# Patient Record
Sex: Male | Born: 1940 | Race: White | Hispanic: No | Marital: Married | Smoking: Current every day smoker
Health system: Southern US, Community
[De-identification: ages and names within clinical notes are randomized; demographics above are authoritative.]

## PROBLEM LIST (undated history)

## (undated) DIAGNOSIS — I10 Essential (primary) hypertension: Secondary | ICD-10-CM

## (undated) DIAGNOSIS — I2510 Atherosclerotic heart disease of native coronary artery without angina pectoris: Secondary | ICD-10-CM

## (undated) DIAGNOSIS — E872 Acidosis, unspecified: Secondary | ICD-10-CM

## (undated) DIAGNOSIS — I714 Abdominal aortic aneurysm, without rupture, unspecified: Secondary | ICD-10-CM

## (undated) DIAGNOSIS — D638 Anemia in other chronic diseases classified elsewhere: Secondary | ICD-10-CM

## (undated) DIAGNOSIS — R197 Diarrhea, unspecified: Secondary | ICD-10-CM

## (undated) DIAGNOSIS — M549 Dorsalgia, unspecified: Secondary | ICD-10-CM

## (undated) DIAGNOSIS — R531 Weakness: Secondary | ICD-10-CM

## (undated) DIAGNOSIS — D890 Polyclonal hypergammaglobulinemia: Secondary | ICD-10-CM

## (undated) DIAGNOSIS — R748 Abnormal levels of other serum enzymes: Secondary | ICD-10-CM

## (undated) DIAGNOSIS — D649 Anemia, unspecified: Secondary | ICD-10-CM

## (undated) DIAGNOSIS — D696 Thrombocytopenia, unspecified: Secondary | ICD-10-CM

## (undated) DIAGNOSIS — R945 Abnormal results of liver function studies: Secondary | ICD-10-CM

## (undated) DIAGNOSIS — K449 Diaphragmatic hernia without obstruction or gangrene: Secondary | ICD-10-CM

## (undated) DIAGNOSIS — E860 Dehydration: Secondary | ICD-10-CM

## (undated) DIAGNOSIS — G8929 Other chronic pain: Secondary | ICD-10-CM

## (undated) DIAGNOSIS — Z8719 Personal history of other diseases of the digestive system: Secondary | ICD-10-CM

## (undated) DIAGNOSIS — Z8711 Personal history of peptic ulcer disease: Secondary | ICD-10-CM

## (undated) DIAGNOSIS — E538 Deficiency of other specified B group vitamins: Secondary | ICD-10-CM

## (undated) DIAGNOSIS — R7989 Other specified abnormal findings of blood chemistry: Secondary | ICD-10-CM

## (undated) DIAGNOSIS — E785 Hyperlipidemia, unspecified: Secondary | ICD-10-CM

## (undated) DIAGNOSIS — K5900 Constipation, unspecified: Secondary | ICD-10-CM

## (undated) DIAGNOSIS — M109 Gout, unspecified: Secondary | ICD-10-CM

## (undated) DIAGNOSIS — E559 Vitamin D deficiency, unspecified: Secondary | ICD-10-CM

## (undated) DIAGNOSIS — N179 Acute kidney failure, unspecified: Secondary | ICD-10-CM

## (undated) DIAGNOSIS — M1990 Unspecified osteoarthritis, unspecified site: Secondary | ICD-10-CM

## (undated) DIAGNOSIS — R634 Abnormal weight loss: Secondary | ICD-10-CM

## (undated) HISTORY — DX: Constipation, unspecified: K59.00

## (undated) HISTORY — DX: Acidosis, unspecified: E87.20

## (undated) HISTORY — DX: Atherosclerotic heart disease of native coronary artery without angina pectoris: I25.10

## (undated) HISTORY — DX: Acidosis: E87.2

## (undated) HISTORY — DX: Abnormal results of liver function studies: R94.5

## (undated) HISTORY — DX: Weakness: R53.1

## (undated) HISTORY — DX: Other chronic pain: G89.29

## (undated) HISTORY — DX: Hyperlipidemia, unspecified: E78.5

## (undated) HISTORY — PX: ANKLE FRACTURE SURGERY: SHX122

## (undated) HISTORY — DX: Abnormal levels of other serum enzymes: R74.8

## (undated) HISTORY — PX: LUMBAR DISC SURGERY: SHX700

## (undated) HISTORY — DX: Abnormal weight loss: R63.4

## (undated) HISTORY — DX: Dorsalgia, unspecified: M54.9

## (undated) HISTORY — PX: FRACTURE SURGERY: SHX138

## (undated) HISTORY — DX: Diarrhea, unspecified: R19.7

## (undated) HISTORY — PX: CARPAL TUNNEL RELEASE: SHX101

## (undated) HISTORY — PX: BACK SURGERY: SHX140

## (undated) HISTORY — DX: Other specified abnormal findings of blood chemistry: R79.89

## (undated) HISTORY — DX: Gout, unspecified: M10.9

## (undated) HISTORY — DX: Abdominal aortic aneurysm, without rupture, unspecified: I71.40

## (undated) HISTORY — DX: Acute kidney failure, unspecified: N17.9

## (undated) HISTORY — PX: POSTERIOR LUMBAR FUSION: SHX6036

## (undated) HISTORY — DX: Thrombocytopenia, unspecified: D69.6

## (undated) HISTORY — DX: Polyclonal hypergammaglobulinemia: D89.0

## (undated) HISTORY — DX: Anemia in other chronic diseases classified elsewhere: D63.8

## (undated) HISTORY — DX: Dehydration: E86.0

## (undated) HISTORY — PX: TOE AMPUTATION: SHX809

## (undated) HISTORY — DX: Abdominal aortic aneurysm, without rupture: I71.4

## (undated) HISTORY — DX: Deficiency of other specified B group vitamins: E53.8

## (undated) HISTORY — DX: Anemia, unspecified: D64.9

## (undated) HISTORY — DX: Vitamin D deficiency, unspecified: E55.9

## (undated) HISTORY — DX: Essential (primary) hypertension: I10

## (undated) HISTORY — DX: Diaphragmatic hernia without obstruction or gangrene: K44.9

## (undated) NOTE — *Deleted (*Deleted)
NAME:  Paul [name], MRN:  [id number], DOB:  19-Dec-1940, LOS: 2 ADMISSION DATE:  12/01/2019, CONSULTATION DATE:  11/1 REFERRING MD:  Dr. Deretha Emory, CHIEF COMPLAINT:   hypoxia  Brief History   47 year old male with RA admitted with hypoxia and hemoptysis.   History of present illness   ***  Past Medical History  ***  Significant Hospital Events   ***  Consults:  ***  Procedures:  ***  Significant Diagnostic Tests:  ***  Micro Data:  ***  Antimicrobials:  ***   Interim history/subjective:  ***  Objective   Blood pressure (!) 149/61, pulse 67, temperature 97.9 F (36.6 C), temperature source Oral, resp. rate 18, height 5\' 10"  (1.778 m), weight 86.7 kg, SpO2 97 %.        Intake/Output Summary (Last 24 hours) at 12/03/2019 1438 Last data filed at 12/03/2019 1230 Gross per 24 hour  Intake 1491.93 ml  Output 1950 ml  Net -458.07 ml   Filed Weights   12/01/19 1926 12/02/19 1931  Weight: 84.4 kg 86.7 kg    Examination: General: *** HENT: *** Lungs: *** Cardiovascular: *** Abdomen: *** Extremities: *** Neuro: *** GU: ***  Resolved Hospital Problem list   ***  Assessment & Plan:  ***  Best practice:  Diet: *** Pain/Anxiety/Delirium protocol (if indicated): *** VAP protocol (if indicated): *** DVT prophylaxis: *** GI prophylaxis: *** Glucose control: *** Mobility: *** Code Status: *** Family Communication: *** Disposition:   Labs   CBC: Recent Labs  Lab 11/29/19 1319 12/01/19 1915 12/02/19 1037 12/03/19 0323  WBC 16.2* 31.9*  32.0* 24.4* 18.9*  NEUTROABS 10.3* 21.0*  --  14.6*  HGB 10.0* 11.2*  11.0* 9.4* 8.7*  HCT 30.3* 34.9*  34.7* 29.2* 26.9*  MCV 81.2 82.9  82.4 83.0 82.0  PLT 214 258  275 222 [id number]    Basic Metabolic Panel: Recent Labs  Lab 12/01/19 1915 12/02/19 1032 12/03/19 0323  NA 131* 133* 135  K 4.7 4.6 4.6  CL 105 107 107  CO2 18* 21* 22  GLUCOSE 130* 138* 96  BUN 26* 25* 28*  CREATININE 1.36* 1.26*  1.41*  CALCIUM 8.5* 8.5* 8.4*  MG  --   --  1.6*   GFR: Estimated Creatinine Clearance: 45.3 mL/min (A) (by C-G formula based on SCr of 1.41 mg/dL (H)). Recent Labs  Lab 11/29/19 1319 12/01/19 1915 12/01/19 2019 12/02/19 1032 12/02/19 1037 12/03/19 0323  PROCALCITON  --   --   --  0.18  --   --   WBC 16.2* 31.9*  32.0*  --   --  24.4* 18.9*  LATICACIDVEN  --   --  1.1 1.2  --   --     Liver Function Tests: Recent Labs  Lab 12/01/19 2019 12/02/19 1032 12/03/19 0323  AST 25 19 22   ALT 17 17 18   ALKPHOS 83 74 80  BILITOT 1.3* 0.9 0.8  PROT 7.5 7.5 7.4  ALBUMIN 2.5* 2.6* 2.4*   No results for input(s): LIPASE, AMYLASE in the last 168 hours. No results for input(s): AMMONIA in the last 168 hours.  ABG No results found for: PHART, PCO2ART, PO2ART, HCO3, TCO2, ACIDBASEDEF, O2SAT   Coagulation Profile: Recent Labs  Lab 12/01/19 2019  INR 1.2    Cardiac Enzymes: No results for input(s): CKTOTAL, CKMB, CKMBINDEX, TROPONINI in the last 168 hours.  HbA1C: Hemoglobin A1C  Date/Time Value Ref Range Status  06/12/2019 03:19 PM 5.2 4.0 - 5.6 % Final  01/17/2019 02:49 PM 5.7 (A) 4.0 - 5.6 % Final  04/25/2015 12:00 AM 7.7  Final   Hgb A1c MFr Bld  Date/Time Value Ref Range Status  12/02/2019 10:34 AM 4.7 (L) 4.8 - 5.6 % Final    Comment:    (NOTE) Pre diabetes:          5.7%-6.4%  Diabetes:              >6.4%  Glycemic control for   <7.0% adults with diabetes   09/02/2018 05:06 AM 5.2 4.8 - 5.6 % Final    Comment:    (NOTE) Pre diabetes:          5.7%-6.4% Diabetes:              >6.4% Glycemic control for   <7.0% adults with diabetes     CBG: No results for input(s): GLUCAP in the last 168 hours.  Review of Systems:   ***  Past Medical History  He,  has a past medical history of AAA (abdominal aortic aneurysm) ([place]), Abnormal LFTs, AKI (acute kidney injury) ([place]), Anemia of chronic disease, Arthritis, Atherosclerosis of native coronary artery of  native heart without angina pectoris, CAD (coronary artery disease), Chronic back pain, Constipation, Coronary atherosclerosis of native coronary artery, Dehydration, Diabetes mellitus, Diarrhea, Elevated liver enzymes, Generalized weakness, Gout of right hand, Hiatal hernia, History of gastroesophageal reflux (GERD), History of stomach ulcers, Hyperlipidemia, Hypertension, Metabolic acidosis, Normochromic anemia (02/23/2016), 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   Surgical History    Past Surgical History:  Procedure Laterality Date  . ANKLE FRACTURE SURGERY Left    "crushed"  . BACK SURGERY  X 3   "fell 4 stories in 1977"  . CARPAL TUNNEL RELEASE Left   . CORONARY ANGIOPLASTY WITH STENT PLACEMENT  09/26/2008   [name] 06/03/2010  . FRACTURE SURGERY    . LEFT HEART CATHETERIZATION WITH CORONARY ANGIOGRAM N/A 01/21/2012   Procedure: LEFT HEART CATHETERIZATION WITH CORONARY ANGIOGRAM;  Surgeon: Burnell Blanks, MD;  Location: [facility] CATH LAB;  Service: Cardiovascular;  Laterality: N/A;  . LUMBAR [place]; ~ 1980  . POSTERIOR LUMBAR FUSION  ~ 1998   "3rd OR to reconstruct my back after fall in 1977"  . 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 He has a 10.00 pack-year smoking history. He has never used smokeless tobacco. He reports previous alcohol use. He reports that he does not use drugs.   Family History   His family history includes Cancer in his mother; Heart attack in his mother and sister; Heart disease in his mother and sister.   Allergies Allergies  Allergen Reactions  . Allopurinol Other (See Comments)    thrombocytopenia  . Metformin And Related Diarrhea     Home Medications  Prior to Admission medications   Medication Sig Start Date End Date Taking? Authorizing  Provider  amLODipine (NORVASC) 5 MG tablet Take 1 tablet (5 mg total) by mouth daily. 02/14/19  Yes Burnell Blanks, MD  aspirin 81 MG chewable tablet Chew 81 mg by mouth daily.   Yes [provider]  clopidogrel (PLAVIX) 75 MG tablet TAKE 1 TABLET BY MOUTH  DAILY 11/23/19  Yes Denita Lung, MD  ENBREL SURECLICK 50 MG/ML injection Inject 50 mg into the skin once a week.  04/26/19  Yes [provider]  febuxostat (ULORIC) 40 MG tablet Take 40 mg by mouth daily.   Yes [provider]  fish oil-omega-3 fatty acids 1000 MG capsule Take 1 g by mouth 2 (two) times daily.    Yes [provider]  isosorbide mononitrate (IMDUR) 30 MG 24 hr tablet Take one tablet by mouth every morning, take two tablets by mouth every evening 02/14/19  Yes Burnell Blanks, MD  leflunomide (ARAVA) 20 MG tablet Take 20 mg by mouth daily. 05/21/19  Yes [provider]  metoprolol tartrate (LOPRESSOR) 25 MG tablet TAKE 1 TABLET BY MOUTH  TWICE DAILY 10/01/19  Yes Burnell Blanks, MD  Multiple Vitamins-Minerals (CENTRUM) tablet Take 1 tablet by mouth daily.   Yes [provider]  nitroGLYCERIN (NITROSTAT) 0.4 MG SL tablet Place 0.4 mg under the tongue every 5 (five) minutes as needed for chest pain. For 3 doses 10/12/11  Yes Denita Lung, MD  pyridoxine (B-6) 100 MG tablet Take 100 mg by mouth daily.   Yes [provider]  simvastatin (ZOCOR) 20 MG tablet Take 1 tablet (20 mg total) by mouth at bedtime. 11/23/18  Yes Burnell Blanks, MD  doxycycline (VIBRA-TABS) 100 MG tablet Take 1 tablet (100 mg total) by mouth 2 (two) times daily. Patient not taking: Reported on 12/01/2019 09/18/19   Evelina Bucy, DPM  glucose blood (ACCU-CHEK AVIVA PLUS) test strip Test once to twice daily 02/06/18   Denita Lung, MD  silver sulfADIAZINE (SILVADENE) 1 % cream Apply pea-sized amount to wound daily. Patient not taking: Reported on 12/01/2019  03/30/19   Evelina Bucy, DPM     Critical care time: ***

---

## 1998-05-05 ENCOUNTER — Encounter: Payer: Self-pay | Admitting: Cardiovascular Disease

## 2000-03-09 ENCOUNTER — Encounter: Payer: Self-pay | Admitting: Cardiovascular Disease

## 2001-11-16 ENCOUNTER — Encounter: Payer: Self-pay | Admitting: Cardiovascular Disease

## 2006-06-22 ENCOUNTER — Encounter: Payer: Self-pay | Admitting: Cardiovascular Disease

## 2007-01-20 ENCOUNTER — Ambulatory Visit: Payer: Self-pay | Admitting: Family Medicine

## 2007-01-23 ENCOUNTER — Ambulatory Visit (HOSPITAL_COMMUNITY): Admission: RE | Admit: 2007-01-23 | Discharge: 2007-01-23 | Payer: Self-pay | Admitting: Family Medicine

## 2007-03-07 ENCOUNTER — Ambulatory Visit: Payer: Self-pay | Admitting: Family Medicine

## 2007-04-27 ENCOUNTER — Ambulatory Visit: Payer: Self-pay | Admitting: Family Medicine

## 2008-08-22 ENCOUNTER — Ambulatory Visit: Payer: Self-pay | Admitting: Family Medicine

## 2008-09-02 ENCOUNTER — Ambulatory Visit (HOSPITAL_COMMUNITY): Admission: RE | Admit: 2008-09-02 | Discharge: 2008-09-02 | Payer: Self-pay | Admitting: Family Medicine

## 2008-09-17 ENCOUNTER — Ambulatory Visit: Payer: Self-pay | Admitting: Cardiovascular Disease

## 2008-09-17 ENCOUNTER — Encounter (INDEPENDENT_AMBULATORY_CARE_PROVIDER_SITE_OTHER): Payer: Self-pay | Admitting: *Deleted

## 2008-09-17 ENCOUNTER — Telehealth (INDEPENDENT_AMBULATORY_CARE_PROVIDER_SITE_OTHER): Payer: Self-pay | Admitting: *Deleted

## 2008-09-17 DIAGNOSIS — I2510 Atherosclerotic heart disease of native coronary artery without angina pectoris: Secondary | ICD-10-CM | POA: Insufficient documentation

## 2008-09-17 DIAGNOSIS — R079 Chest pain, unspecified: Secondary | ICD-10-CM | POA: Insufficient documentation

## 2008-09-23 ENCOUNTER — Ambulatory Visit: Payer: Self-pay

## 2008-09-23 ENCOUNTER — Ambulatory Visit: Payer: Self-pay | Admitting: Cardiovascular Disease

## 2008-09-23 ENCOUNTER — Encounter: Payer: Self-pay | Admitting: Cardiovascular Disease

## 2008-09-23 LAB — CONVERTED CEMR LAB
BUN: 19 mg/dL (ref 6–23)
Basophils Absolute: 0 10*3/uL (ref 0.0–0.1)
Basophils Relative: 0.3 % (ref 0.0–3.0)
CO2: 31 meq/L (ref 19–32)
Calcium: 9 mg/dL (ref 8.4–10.5)
Chloride: 107 meq/L (ref 96–112)
Creatinine, Ser: 0.8 mg/dL (ref 0.4–1.5)
Eosinophils Absolute: 0.2 10*3/uL (ref 0.0–0.7)
Eosinophils Relative: 2.7 % (ref 0.0–5.0)
GFR calc non Af Amer: 102.6 mL/min (ref >60)
Glucose, Bld: 118 mg/dL — ABNORMAL HIGH (ref 70–99)
HCT: 33.8 % — ABNORMAL LOW (ref 39.0–52.0)
Hemoglobin: 11.9 g/dL — ABNORMAL LOW (ref 13.0–17.0)
INR: 1 (ref 0.8–1.0)
Lymphocytes Relative: 40.3 % (ref 12.0–46.0)
Lymphs Abs: 2.3 10*3/uL (ref 0.7–4.0)
MCHC: 35.3 g/dL (ref 30.0–36.0)
MCV: 105.8 fL — ABNORMAL HIGH (ref 78.0–100.0)
Monocytes Absolute: 0.4 10*3/uL (ref 0.1–1.0)
Monocytes Relative: 7.2 % (ref 3.0–12.0)
Neutro Abs: 2.7 10*3/uL (ref 1.4–7.7)
Neutrophils Relative %: 49.5 % (ref 43.0–77.0)
Platelets: 154 10*3/uL (ref 150.0–400.0)
Potassium: 4.5 meq/L (ref 3.5–5.1)
Prothrombin Time: 10.5 s (ref 9.1–11.7)
RBC: 3.2 M/uL — ABNORMAL LOW (ref 4.22–5.81)
RDW: 22 % — ABNORMAL HIGH (ref 11.5–14.6)
Sodium: 141 meq/L (ref 135–145)
WBC: 5.6 10*3/uL (ref 4.5–10.5)

## 2008-09-24 ENCOUNTER — Ambulatory Visit: Payer: Self-pay | Admitting: Family Medicine

## 2008-09-26 ENCOUNTER — Ambulatory Visit: Payer: Self-pay | Admitting: Cardiovascular Disease

## 2008-09-26 ENCOUNTER — Inpatient Hospital Stay (HOSPITAL_COMMUNITY): Admission: RE | Admit: 2008-09-26 | Discharge: 2008-09-27 | Payer: Self-pay | Admitting: Chiropractic Medicine

## 2008-09-26 ENCOUNTER — Inpatient Hospital Stay (HOSPITAL_BASED_OUTPATIENT_CLINIC_OR_DEPARTMENT_OTHER): Admission: RE | Admit: 2008-09-26 | Discharge: 2008-09-26 | Payer: Self-pay | Admitting: Cardiovascular Disease

## 2008-09-26 HISTORY — PX: CORONARY ANGIOPLASTY WITH STENT PLACEMENT: SHX49

## 2008-09-27 ENCOUNTER — Encounter: Payer: Self-pay | Admitting: Cardiovascular Disease

## 2008-10-09 ENCOUNTER — Ambulatory Visit: Payer: Self-pay | Admitting: Family Medicine

## 2008-10-10 ENCOUNTER — Encounter: Payer: Self-pay | Admitting: Cardiovascular Disease

## 2008-10-10 ENCOUNTER — Ambulatory Visit: Payer: Self-pay | Admitting: Cardiology

## 2009-01-15 ENCOUNTER — Encounter: Payer: Self-pay | Admitting: Cardiovascular Disease

## 2009-09-18 ENCOUNTER — Ambulatory Visit: Payer: Self-pay | Admitting: Family Medicine

## 2009-09-22 ENCOUNTER — Ambulatory Visit: Payer: Self-pay | Admitting: Family Medicine

## 2009-09-22 ENCOUNTER — Ambulatory Visit (HOSPITAL_COMMUNITY): Admission: RE | Admit: 2009-09-22 | Discharge: 2009-09-22 | Payer: Self-pay | Admitting: Family Medicine

## 2009-09-23 ENCOUNTER — Ambulatory Visit: Payer: Self-pay | Admitting: Family Medicine

## 2009-09-23 ENCOUNTER — Ambulatory Visit: Payer: Self-pay | Admitting: Cardiovascular Disease

## 2009-09-29 ENCOUNTER — Telehealth: Payer: Self-pay | Admitting: Cardiovascular Disease

## 2009-10-02 LAB — HM COLONOSCOPY

## 2010-03-03 NOTE — Progress Notes (Signed)
  PAtient filled  out Release OF Information I recieved it and faxed over to [facility] @ [facility] in [place]   for Records to be sent here for Dr.Mcalhany  [facility]  September 17, 2008 2:53 PM      Appended Document:  Spoke with Seth Bake @ [place] asking for LOV, last couple EKG's she will have to call thier [facility] and ask them to fax the Records to her and she will forward to me. her # [phone]  Appended Document:  Recieved Records from [facility] @ [place] on Paul [name] will forward to [place] [facility]ords to [place] she said she will forward to [place], Records were taken to [employer]

## 2010-03-03 NOTE — Cardiovascular Report (Signed)
Summary: [facility]   Imported By: Valda Favia 12/20/2008 14:53:05  _____________________________________________________________________  External Attachment:    Type:   Image     Comment:   External Document

## 2010-03-03 NOTE — Letter (Signed)
Summary: Office Note/[facility]  Office [facility]   Imported By: Valda Favia 12/20/2008 15:03:43  _____________________________________________________________________  External Attachment:    Type:   Image     Comment:   External Document

## 2010-03-03 NOTE — Assessment & Plan Note (Signed)
Summary: np6/hx of stent/chest pain/sob   Visit Type:  new pt visit Primary Provider:  Chanda Busing  CC:  chest pain and sob.  History of Present Illness: 70 yo WM with history of CAD with prior stent therapy, DM here today to establish cardiology care. He has been having band-like chest pain in the upper chest, a/w SOB and diaphoresis. No nausea or syncope. No rest dyspnea. He reports occasional lower extremity edema. No pain in legs with ambulation. He has been followed in the past by Dr. Helane Gunther in [facility] but has not been seen in the last several years. His most recent cath was 5 years ago and showed patent stent (? vessel).  Current Medications (verified): 1)  Quinapril Hcl 20 Mg Tabs (Quinapril Hcl) .Marland Kitchen.. 1 Tab Once Daily 2)  Simvastatin 80 Mg Tabs (Simvastatin) .Marland Kitchen.. 1 Tab At Bedtime 3)  Gabapentin 600 Mg Tabs (Gabapentin) .Marland Kitchen.. 1 Tab Two Times A Day 4)  Metformin Hcl 500 Mg Tabs (Metformin Hcl) .Marland Kitchen.. 1 Tab Two Times A Day 5)  Metoprolol Succinate 50 Mg Xr24h-Tab (Metoprolol Succinate) .Marland Kitchen.. 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  The patient denies fatigue, malaise, fever, weight gain/loss, vision loss, decreased hearing, hoarseness, palpitations, prolonged cough, wheezing, sleep apnea,  coughing up blood, abdominal pain, blood in stool, nausea, vomiting, diarrhea, heartburn, incontinence, blood in urine, muscle weakness, joint pain, leg swelling, rash, skin lesions, headache, fainting, dizziness, depression, anxiety, enlarged lymph nodes, easy bruising or bleeding, and environmental allergies.    Vital Signs:  Patient profile:   70 year old male Height:      70 inches Weight:      234 pounds BMI:     33.70 Pulse rate:   69 / minute Pulse rhythm:   regular BP sitting:   126 / 60  (left arm) Cuff size:   large  Vitals Entered By: Julaine Hua, CMA (September 17, 2008 12:17 PM)  Physical Exam  General:  General: Well developed, well nourished, NAD HEENT: OP clear, mucus membranes moist SKIN: warm, dry Neuro: No focal deficits Musculoskeletal: Muscle strength 5/5 all ext Psychiatric: Mood and affect normal Neck: No JVD, no carotid bruits, no thyromegaly, no lymphadenopathy. Lungs:Clear bilaterally, no wheezes, rhonci, crackles CV: RRR no murmurs, gallops rubs Abdomen: soft, NT, ND, BS present Extremities: No edema, pulses 2+. 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    EKG  Procedure date:  09/17/2008  Findings:      NSR, rate 69 bpm. Poor R wave progression.   Impression & Recommendations:  Problem # 1:  CAD, NATIVE VESSEL (ICD-414.01)  Pt with h/o of CAD and prior stent and now with recurrent exertional chest pain c/w his prior angina. I think a cardiac cath is indicated. We will arrange this next week in the outpatient cath  lab at [facility]. I will check BMET, CBC, coagulation profile  and echo next week.   No change in meds. All risks and benefits of procedure discussed with pt. We will obtain records from Dr. Tarri Glenn office in [place].   His updated medication list for this problem includes:    Quinapril Hcl 20 Mg Tabs (Quinapril hcl) .Marland Kitchen... 1 tab once daily    Metoprolol Succinate 50 Mg Xr24h-tab (Metoprolol succinate) .Marland Kitchen... 1 tab once daily     Aspirin 81 Mg Tbec (Aspirin) .Marland Kitchen... 1 by mouth once daily  His updated medication list for this problem includes:    Quinapril Hcl 20 Mg Tabs (Quinapril hcl) .Marland Kitchen... 1 tab once daily    Metoprolol Succinate 50 Mg Xr24h-tab (Metoprolol succinate) .Marland Kitchen... 1 tab once daily  Other Orders: EKG w/ Interpretation (93000) Echocardiogram (Echo)  Patient Instructions: 1)  Your physician discussed the risks, benefits and indications for preventive aspirin therapy. It is recommended that you start (or continue) taking 325 mg of aspirin a day. 2)  Your physician recommends that you schedule a follow-up appointment in: 4 Weeks 3)  Your physician recommends that you return for lab work: BMP, CBC diff, PT, INR on same day as Echocardiogram. 4)  Your physician has requested that you have an echocardiogram.  Echocardiography is a painless test that uses sound waves to create images of your heart. It provides your doctor with information about the size and shape of your heart and how well your heart's chambers and valves are working.  This procedure takes approximately one hour. There are no restrictions for this procedure. 5)  Your physician has requested that you have a cardiac catheterization.  Cardiac catheterization is used to diagnose and/or treat various heart conditions. Doctors may recommend this procedure for a number of different reasons. The most common reason is to evaluate chest pain. Chest pain can be a symptom of coronary artery disease (CAD), and cardiac catheterization can show whether plaque is narrowing or blocking your heart's arteries. This procedure is also used to evaluate the valves, as well as measure the blood flow and oxygen levels in different parts of your heart.  For further information please visit [email or web address].  Please follow instruction sheet, as given.

## 2010-03-03 NOTE — Letter (Signed)
Summary: CDC Pre-Cath Orders   CDC Pre-Cath Orders   Imported By: Jamelle Haring 10/14/2008 16:28:37  _____________________________________________________________________  External Attachment:    Type:   Image     Comment:   External Document

## 2010-03-03 NOTE — Assessment & Plan Note (Signed)
Summary: Paul [name]   Visit Type:  1 yr f/u Primary Provider:  Jill Alexanders  CC:  no cardiac complaints today.  History of Present Illness: 70yo WM with history of CAD with prior stent therapy in the RCA, DM here today for cardiology follow up. I last saw him in September 2010. His initial consultation was for band-like chest pain. No nausea or syncope. No rest dyspnea. He reported occasional lower extremity edema. No pain in legs with ambulation. He has been followed in the past by Dr. Helane Gunther in [facility] but has not been seen in the last several years. His most recent cath was 5 years ago and showed a patent stent in the RCA. At the first visit, I was concerned for progression of his coronary artery disease and arranged an echo and left heart cath. His echo showed mild LVH and normal LV function with no significant valvular abnormalities. His cath showed a near total occlusion of the RCA beyond the stent in the mid portion. I attempted PCI and was able to balloon the mid lesion but could never pass a balloon distally. He returns today for follow up and tells me he is doing well. He has had no chest pain, SOB, palpitations, near syncope or syncope. He is feeling great. He is heading back to the [place] in two weeks and will be back in [place] in July 2012.   Current Medications (verified): 1)  Quinapril Hcl 20 Mg Tabs (Quinapril Hcl) .Marland Kitchen.. 1 Tab Once Daily 2)  Simvastatin 80 Mg Tabs (Simvastatin) .Marland Kitchen.. 1 Tab At Bedtime 3)  Gabapentin 600 Mg Tabs (Gabapentin) .Marland Kitchen.. 1 Tab Two Times A Day 4)  Metformin Hcl 500 Mg Tabs (Metformin Hcl) .Marland Kitchen.. 1 Tab Two Times A Day 5)  Metoprolol Succinate 50 Mg Xr24h-Tab (Metoprolol Succinate) .... 2 Tabs Once Daily 6)  Fish Oil   Oil (Fish Oil) .... 2 By Mouth Daily 7)  Plavix 75 Mg Tabs (Clopidogrel Bisulfate) .... Daily 8)  Isosorbide Mononitrate Cr 30 Mg Xr24h-Tab (Isosorbide Mononitrate) .Marland Kitchen.. 1 Tab Once Daily 9)  Nitrostat 0.4 Mg Subl (Nitroglycerin) .Marland Kitchen.. 1 Tablet  Under Tongue At Onset of Chest Pain; You May Repeat Every 5 Minutes For Up To 3 Doses. 10)  Aspirin 81 Mg Tbec (Aspirin) .... Take One Tablet By Mouth Daily  Allergies (verified): No Known Drug Allergies  Past History:  Past Medical History: CAD with  cath 09/26/08 showing severe disease in RCA with POBA, mild to moderate disease in Circ and LAD.  Hiatal hernia DM with neuropathy Gout Chronic back problems with prior surgery  Social History: Reviewed history from 09/17/2008 and no changes required. No tobacco, alcohol or illiicit drug use Divorced 1 children Retired Spends most of time in [employer] [place] in [place]  The patient denies fatigue, malaise, fever, weight gain/loss, vision loss, decreased hearing, hoarseness, chest pain, palpitations, shortness of breath, prolonged cough, wheezing, sleep apnea, coughing up blood, abdominal pain, blood in stool, nausea, vomiting, diarrhea, heartburn, incontinence, blood in urine, muscle weakness, joint pain, leg swelling, rash, skin lesions, headache, fainting, dizziness, depression, anxiety, enlarged lymph nodes, easy bruising or bleeding, and environmental allergies.    Vital Signs:  Patient profile:   70 year old male Height:      70 inches Weight:      224.12 pounds BMI:     32.27 Pulse rate:   56 / minute Pulse rhythm:   irregular BP sitting:  126 / 80  (left arm) Cuff size:   large  Vitals Entered By: Julaine Hua, CMA (September 23, 2009 9:38 AM)  Physical Exam  General:  General: Well developed, well nourished, NAD Musculoskeletal: Muscle strength 5/5 all ext Psychiatric: Mood and affect normal Neck: No JVD, no carotid bruits, no thyromegaly, no lymphadenopathy. Lungs:Clear bilaterally, no wheezes, rhonci, crackles CV: RRR no murmurs, gallops rubs Abdomen: soft, NT, ND, BS present Extremities: No edema, right 1st and second toe amputation.      EKG  Procedure date:  09/23/2009  Findings:      Sinus brady, rate 56bpm. Poor R wave progression.   Impression & Recommendations:  Problem # 1:  CORONARY ATHEROSCLEROSIS NATIVE CORONARY ARTERY (ICD-414.01) Stable. Will continue current therapy. No changes.  I will see him in one year.   The following medications were removed from the medication list:    Aspirin 325 Mg Tabs (Aspirin) .Marland Kitchen... Daily His updated medication list for this problem includes:    Quinapril Hcl 20 Mg Tabs (Quinapril hcl) .Marland Kitchen... 1 tab once daily    Metoprolol Succinate 50 Mg Xr24h-tab (Metoprolol succinate) .Marland Kitchen... 2 tabs once daily    Plavix 75 Mg Tabs (Clopidogrel bisulfate) .Marland Kitchen... Daily    Isosorbide Mononitrate Cr 30 Mg Xr24h-tab (Isosorbide mononitrate) .Marland Kitchen... 1 tab once daily    Nitrostat 0.4 Mg Subl (Nitroglycerin) .Marland Kitchen... 1 tablet under tongue at onset of chest pain; you may repeat every 5 minutes for up to 3 doses.    Aspirin 81 Mg Tbec (Aspirin) .Marland Kitchen... Take one tablet by mouth daily  Orders: EKG w/ Interpretation (93000)  Patient Instructions: 1)  Your physician recommends that you schedule a follow-up appointment in: 1 year 2)  Your physician recommends that you continue on your current medications as directed. Please refer to the Current Medication list given to you today. Prescriptions: ISOSORBIDE MONONITRATE CR 30 MG XR24H-TAB (ISOSORBIDE MONONITRATE) 1 tab once daily  #90 x 4   Entered by:   Thompson Grayer, RN, BSN   Authorized by:   Lauree Chandler, MD   Signed by:   Thompson Grayer, RN, BSN on 09/23/2009   Method used:   Faxed to ...       [facility]             , [place]         Ph:        Fax: [phone]   RxID:   [id number] PLAVIX 75 MG TABS (CLOPIDOGREL BISULFATE) daily  #90 x 4   Entered by:   Thompson Grayer, RN, BSN   Authorized by:   Lauree Chandler, MD   Signed by:   Thompson Grayer, RN, BSN on 09/23/2009    Method used:   Faxed to ...       [facility] (mail[facility]order)             , [place]         Ph:        Fax: [phone]   RxID:   [id number]

## 2010-03-03 NOTE — Progress Notes (Signed)
Summary: RX RESENT TODAY TO [facility]  Phone Note Call from Patient   Caller: sister Reason for Call: Talk to Nurse Summary of Call: [facility] states they didn't get rx we show was sent electronically last week-can we resend? Initial call taken by: Lorenda Hatchet,  September 29, 2009 2:03 PM    Prescriptions: ISOSORBIDE MONONITRATE CR 30 MG XR24H-TAB (ISOSORBIDE MONONITRATE) 1 tab once daily  #90 x 4   Entered by:   Julaine Hua, CMA   Authorized by:   Lauree Chandler, MD   Signed by:   Julaine Hua, CMA on 09/29/2009   Method used:   Faxed to ...       [facility] (mail[facility]order)             , [place]         Ph:        Fax: [phone]   RxID:   [id number] PLAVIX 41 MG TABS (CLOPIDOGREL BISULFATE) daily  #90 x 4   Entered by:   Julaine Hua, CMA   Authorized by:   Lauree Chandler, MD   Signed by:   Julaine Hua, CMA on 09/29/2009   Method used:   Faxed to ...       [facility] (mail[facility]order)             , [place]         Ph:        Fax: [phone]   RxID:   [id number]

## 2010-03-03 NOTE — Assessment & Plan Note (Signed)
Summary: 1 month rov    Visit Type:  Follow-up Primary Provider:  Chanda Busing  CC:  No complaints.  History of Present Illness: 70 yo WM with history of CAD with prior stent therapy in the RCA, DM here today for cardiology follow up. His initial consultation was for band-like chest pain. No nausea or syncope. No rest dyspnea. He reported occasional lower extremity edema. No pain in legs with ambulation. He has been followed in the past by Dr. Helane Gunther in [facility] but has not been seen in the last several years. His most recent cath was 5 years ago and showed a patent stent in the RCA. At the first visit, I was concerned for progression of his coronary artery disease and arranged an echo and left heart cath. His echo showed mild LVH and normal LV function with no significant valvular abnormalities. His cath showed a near total occlusion of the RCA beyond the stent in the mid portion. I attempted PCI and was able to balloon the mid lesion but could never pass a balloon distally. He returns today for follow up and tells me he is doing well. He has had no chest pain, SOB, palpitations, near syncope or syncope. He describes none of the symptoms that were occuring during the first visit in our office. He is planning to return to the [place] next week.    Current Medications (verified): 1)  Quinapril Hcl 20 Mg Tabs (Quinapril Hcl) .Marland Kitchen.. 1 Tab Once Daily 2)  Simvastatin 80 Mg Tabs (Simvastatin) .Marland Kitchen.. 1 Tab At Bedtime 3)  Gabapentin 600 Mg Tabs (Gabapentin) .Marland Kitchen.. 1 Tab Two Times A Day 4)  Metformin Hcl 500 Mg Tabs (Metformin Hcl) .Marland Kitchen.. 1 Tab Two Times A Day 5)  Metoprolol Succinate 50 Mg Xr24h-Tab (Metoprolol Succinate) .Marland Kitchen.. 1 Tab Once Daily 6)  Aspirin 325 Mg  Tabs (Aspirin) .... Daily 7)  Fish Oil   Oil (Fish Oil) .... 2 By Mouth Daily 8)  Glucosamine-Chondroitin  Caps (Glucosamine-Chondroit-Vit C-Mn) .... 2 By Mouth Daily 9)  Plavix 75 Mg Tabs (Clopidogrel Bisulfate) .... Daily 10)   Isosorbide Mononitrate Cr 30 Mg Xr24h-Tab (Isosorbide Mononitrate) .... Daily 11)  Nitroglycerin 0.4 Mg Subl (Nitroglycerin) .... As Needed  Allergies (verified): No Known Drug Allergies  Past History:  Past Medical History: CAD with recent cath 09/26/08.  Hiatal hernia DM with neuropathy Gout Chronic back problems with prior surgery  Review of Systems  The patient denies fatigue, malaise, fever, weight gain/loss, vision loss, decreased hearing, hoarseness, chest pain, palpitations, shortness of breath, prolonged cough, wheezing, sleep apnea, coughing up blood, abdominal pain, blood in stool, nausea, vomiting, diarrhea, heartburn, incontinence, blood in urine, muscle weakness, joint pain, leg swelling, rash, skin lesions, headache, fainting, dizziness, depression, anxiety, enlarged lymph nodes, easy bruising or bleeding, and environmental allergies.    Vital Signs:  Patient profile:   70 year old male Height:      70 inches Weight:      235 pounds BMI:     33.84 Pulse rate:   59 / minute Resp:     16 per minute BP sitting:   107 / 62  (right arm)  Vitals Entered By: Levora Angel, CNA (October 10, 2008 10:48 AM)  Physical Exam  General:  General: Well developed, well nourished, NAD HEENT: OP clear, mucus membranes moist SKIN: warm, dry Neuro: No focal deficits Musculoskeletal: Muscle strength 5/5 all ext Psychiatric: Mood and affect normal Neck: No JVD, no carotid bruits, no thyromegaly,  no lymphadenopathy. Lungs:Clear bilaterally, no wheezes, rhonci, crackles CV: RRR no murmurs, gallops rubs Abdomen: soft, NT, ND, BS present Extremities: No edema, pulses 2+.    Echocardiogram  Procedure date:  09/23/2008  Findings:      1. Left ventricle: The cavity size was normal. Wall thickness was        increased in a pattern of mild LVH. The estimated ejection        fraction was 60%. Wall motion was normal; there were no regional        wall motion abnormalities.     2.  Aortic valve: Trivial regurgitation.     3. Mitral valve: Trivial regurgitation.     4. Tricuspid valve: Trivial regurgitation.     5. Pulmonary arteries: PA peak pressure: 45mm Hg (S).  Cardiac Cath  Procedure date:  09/26/2008  Findings:      1. The left main coronary artery had diffuse 20-30% disease through     the proximal and midportions and a 40% stenosis in the distal     portion. 2. The left anterior descending is a large vessel that courses at the     apex and gives off several diagonal branches.  There appears to be     a 30% ostial stenosis and a 40% stenosis in the midportion of the     vessel at the take off of a moderate-sized second diagonal branch.     First diagonal is small in caliber.  Second diagonal is moderate     size and has an ostial 40% stenosis. 3. The circumflex artery has a mid 40% stenosis and then has a     moderate-sized bifurcating obtuse marginal branch that has a 40%     stenosis. 4. The right coronary artery is a dominant vessel that has a stented     proximal portion that appears to have moderate 40% in-stent     restenosis.  Beyond the stented segment in the mid vessel there is     a 99% subtotal occlusion.  There is TIMI-1 flow into the distal     right coronary artery.   No left ventricular angiogram was performed.    EKG  Procedure date:  10/10/2008  Findings:      Sinus brady, rate 59 bpm.   Impression & Recommendations:  Problem # 1:  CORONARY ATHEROSCLEROSIS NATIVE CORONARY ARTERY (ICD-414.01) He has patent LAD and Circumflex systems but near total occlusion of the mid RCA which is not amenable to PCI and will be treated medically.  Continue current medications. I would like to continue Plavix as long has he tolerates this medication.   His updated medication list for this problem includes:    Quinapril Hcl 20 Mg Tabs (Quinapril hcl) .Marland Kitchen... 1 tab once daily    Metoprolol Succinate 50 Mg Xr24h-tab (Metoprolol succinate) .Marland Kitchen... 1  tab once daily    Aspirin 325 Mg Tabs (Aspirin) .Marland Kitchen... Daily    Plavix 75 Mg Tabs (Clopidogrel bisulfate) .Marland Kitchen... Daily    Isosorbide Mononitrate Cr 30 Mg Xr24h-tab (Isosorbide mononitrate) .Marland Kitchen... Daily    Nitroglycerin 0.4 Mg Subl (Nitroglycerin) .Marland Kitchen... As needed    Aspirin 81 Mg Tbec (Aspirin) .Marland Kitchen... Take one tablet by mouth daily  Orders: EKG w/ Interpretation (93000)  Patient Instructions: 1)  Your physician has recommended you make the following change in your medication: DECREASE Aspirin to 81mg  once a day 2)  Your physician wants you to follow-up in:   1 YEAR. You  will receive a reminder letter in the mail two months in advance. If you don't receive a letter, please call our office to schedule the follow-up appointment. Prescriptions: ISOSORBIDE MONONITRATE CR 30 MG XR24H-TAB (ISOSORBIDE MONONITRATE) daily  #90 x 4   Entered by:   Theodosia Quay, RN, BSN   Authorized by:   Lauree Chandler, MD   Signed by:   Theodosia Quay, RN, BSN on 10/10/2008   Method used:   Faxed to ...       [facility]mail[facility]order)             , [place]         Ph:        Fax: [phone]   RxID:   [id number] PLAVIX 53 MG TABS (CLOPIDOGREL BISULFATE) daily  #90 x 4   Entered by:   Theodosia Quay, RN, BSN   Authorized by:   Lauree Chandler, MD   Signed by:   Theodosia Quay, RN, BSN on 10/10/2008   Method used:   Faxed to ...       [facility] (mail[facility]order[facility]             , [place]         Ph:        Fax: [phone]   RxID:   [id number]

## 2010-03-03 NOTE — Letter (Signed)
Summary: Office Note/[facility] Cardiology  Office [facility] Card[facility]   Imported By: Valda Favia 12/20/2008 15:04:52  _____________________________________________________________________  External Attachment:    Type:   Image     Comment:   External Document

## 2010-03-03 NOTE — Letter (Signed)
Summary: [facility] - 2005 through 2008  [facility] - 2005 through 2008   Imported By: Marilynne Drivers 10/29/2008 16:43:52  _____________________________________________________________________  External Attachment:    Type:   Image     Comment:   External Document

## 2010-05-09 LAB — GLUCOSE, CAPILLARY
Glucose-Capillary: 108 mg/dL — ABNORMAL HIGH (ref 70–99)
Glucose-Capillary: 108 mg/dL — ABNORMAL HIGH (ref 70–99)
Glucose-Capillary: 159 mg/dL — ABNORMAL HIGH (ref 70–99)
Glucose-Capillary: 72 mg/dL (ref 70–99)
Glucose-Capillary: 87 mg/dL (ref 70–99)

## 2010-05-09 LAB — BASIC METABOLIC PANEL
BUN: 14 mg/dL (ref 6–23)
CO2: 29 mEq/L (ref 19–32)
Calcium: 9.4 mg/dL (ref 8.4–10.5)
Chloride: 106 mEq/L (ref 96–112)
Creatinine, Ser: 0.94 mg/dL (ref 0.4–1.5)
GFR calc Af Amer: >60 mL/min (ref >60)
GFR calc non Af Amer: >60 mL/min (ref >60)
Glucose, Bld: 144 mg/dL — ABNORMAL HIGH (ref 70–99)
Potassium: 4.4 mEq/L (ref 3.5–5.1)
Sodium: 140 mEq/L (ref 135–145)

## 2010-05-09 LAB — CBC
HCT: 35.3 % — ABNORMAL LOW (ref 39.0–52.0)
Hemoglobin: 11.9 g/dL — ABNORMAL LOW (ref 13.0–17.0)
MCHC: 33.6 g/dL (ref 30.0–36.0)
MCV: 108.1 fL — ABNORMAL HIGH (ref 78.0–100.0)
Platelets: 159 10*3/uL (ref 150–400)
RBC: 3.27 MIL/uL — ABNORMAL LOW (ref 4.22–5.81)
RDW: 22.5 % — ABNORMAL HIGH (ref 11.5–15.5)
WBC: 6.8 10*3/uL (ref 4.0–10.5)

## 2010-06-16 NOTE — Cardiovascular Report (Signed)
Paul [name], Paul [name] NO.:  [id number]   MEDICAL RECORD NO.:  [id number]          PATIENT TYPE:  INP   LOCATION:  2503                         FACILITY:  [facility]   PHYSICIAN:  Lauree Chandler, MDDATE OF BIRTH:  1940/11/02   DATE OF PROCEDURE:  09/26/2008  DATE OF DISCHARGE:                            CARDIAC CATHETERIZATION   PROCEDURE PERFORMED:  Percutaneous transluminal coronary angioplasty of  the mid right coronary artery, chronic total occlusion.   OPERATOR:  Lauree Chandler, MD   INDICATIONS:  Chest pain in a patient with known coronary artery  disease.  The patient had a diagnostic catheterization this morning in  the Outpatient Cath Lab and was brought upstairs to the Main Cath Lab  this afternoon for attempt at PCI of the chronic total occlusion of the  right coronary artery.   DETAILS OF PROCEDURE:  The patient was brought to the Main Cardiac  Catheterization Laboratory after signing informed consent for the  procedure.  The 4-French sheath in the right femoral artery was  exchanged out for a 6-French 40-cm sheath.  There was some difficulty  finding a guiding catheter that would engage to the vessel.  I initially  used a No-Torque right catheter and was able to pass a Whisper wire  beyond the chronic total occlusion in mid right coronary artery.  The  wire did not go into the distal vessel, however.  Attempts to pass a 1.5  x 12 mm balloon over this wire.  The balloon was passed down to a  stenosis in the midportion of vessel just prior to the 100% total  occlusion.  We inflated this balloon to 12 atmospheres.  I was hoping  this would help open up the total occlusion.  I was unable to pass this  balloon further and this balloon was removed.  Because of poor guide  support, we elected at this time to switch out guiding catheters.  Ultimately, I ended up selectively engaging the right coronary artery  with an AR2 guiding catheter.  A 4.5 g  [employer] wire was used to  cross the total occlusion.  I was then unable to pass a new 1.5 x 12 mm  balloon beyond the chronic total occlusion.  At this point of the  procedure after multiple attempts, we elected to stop for today.  The  patient tolerated the procedure well and had no complaints at the end of  the procedure.  He was taken to the holding area in stable condition.   IMPRESSION:  1. Percutaneous transluminal coronary angioplasty of the mid right      coronary artery.  2. Unsuccessful percutaneous coronary intervention of the chronic      total occlusion in the distal portion of the mid right coronary      artery.   RECOMMENDATIONS:  The patient will be admitted to telemetry tonight and  monitored closely.  I will likely perform a nuclear stress test tomorrow  to see if there is any viability in the muscle in the inferior wall.  If  this  inferior wall is composed mainly of scar, then we  will likely not  attempt any further revascularization procedures.  If there is ischemia  in this distribution, then we may consider bypass surgery.  The patient  does have moderate left main stenosis.  I will review the films with my  colleagues tonight.      Lauree Chandler, MD  Electronically Signed     CM/MEDQ  D:  09/26/2008  T:  09/27/2008  Job:  [id number]

## 2010-06-16 NOTE — Discharge Summary (Signed)
Paul [name], Paul [name] NO.:  [id number]   MEDICAL RECORD NO.:  [id number]          PATIENT TYPE:  INP   LOCATION:  2503                         FACILITY:  [facility]   PHYSICIAN:  Lauree Chandler, MDDATE OF BIRTH:  1940-07-11   DATE OF ADMISSION:  09/26/2008  DATE OF DISCHARGE:  09/27/2008                               DISCHARGE SUMMARY   PRIMARY CARDIOLOGIST:  Lauree Chandler, MD   DISCHARGE DIAGNOSIS:  Unstable angina.   SECONDARY DIAGNOSES:  1. Coronary artery disease status post previous stenting in [place],      [place].  2. Hiatal hernia.  3. Diabetes.  4. Peripheral neuropathy.  5. Gout.  6. Chronic back problems with prior surgery x3.  7. History of left ankle surgery.  8. Carpal tunnel release of the left arm.  9. History of right first and second toe amputation.   ALLERGIES:  No known drug allergies.   PROCEDURES:  1. Left heart cardiac catheterization revealing a chronic subtotal      occlusion of the mid-right coronary artery with unsuccessful PTCA,      otherwise nonobstructive disease.  2. Adenosine Myoview showing EF of 56%.  Normal wall motion.  There is      soft tissue attenuation versus inferior subendocardial scar with no      evidence for inducible ischemia.  Overall felt to be a low risk      study.   HISTORY OF PRESENT ILLNESS:  A 70 year old Caucasian male with prior  history of CAD and stenting, who recently saw Dr. Angelena Form in clinic on  September 17, 2008, with complaints of chest discomfort.  The patient is  visiting his sister, who is in [place] area.  He spends most of his  time in [place].  Decision was made to pursue outpatient  catheterization.   HOSPITAL COURSE:  The patient presented to the cath lab on September 26, 2008, and underwent left heart cardiac catheterization revealing 99%  subtotal occlusion of the right coronary artery with otherwise  nonobstructive disease.  Arrangements were made for admission  and PCI.  Unfortunately, the interventional team was unable to successfully cross  right coronary artery lesions.  One inflation was made to the proximal  portion of the lesion.  Decision was made to abort the case and assess  for possible ischemia with adenosine Myoview.  Myoview was performed on  September 27, 2008, showing no evidence of ischemia with question of soft  tissue attenuation versus subendocardial inferior scar.  Overall, felt  to be a low-risk study.  We have initiated the low-dose nitrate therapy  and otherwise kept beta-blocker and ACE inhibitor as well as statin.  The patient will be discharged to home in good condition.   DISCHARGE LABORATORY DATA:  Hemoglobin 11.9, hematocrit 35.3, WBC 6.8,  platelets 159.  Sodium 140, potassium 4.4, chloride 106, CO2 of 29, BUN  14, creatinine 0.94, glucose 144.   DISPOSITION:  The patient is being discharged to home today in good  condition.   FOLLOWUP PLANS AND APPOINTMENTS:  We will try and arrange for him to  follow up with Dr.  McAlhany in approximately 1 week prior to his return  to [place].   DISCHARGE MEDICATIONS:  1. Aspirin 325 mg daily.  2. Plavix 75 mg daily x30 days.  3. Imdur 30 mg half a tab daily.  4. Nitroglycerin 0.4 mg sublingual p.r.n. chest pain.  5. Gabapentin 60 mg b.i.d.  6. Metformin 500 mg b.i.d. to be resumed on Saturday, September 28, 2008.  7. Toprol-XL 50 mg daily.  8. Quinapril 20 mg daily.  9. Simvastatin 80 mg at bedtime.   OUTSTANDING LABORATORIES AND STUDIES:  None.   DURATION OF DISCHARGE ENCOUNTER:  Forty-five minutes including physician  time.      Murray Hodgkins, ANP      Lauree Chandler, MD  Electronically Signed    CB/MEDQ  D:  09/27/2008  T:  09/28/2008  Job:  [id number]

## 2010-06-16 NOTE — Cardiovascular Report (Signed)
NAME:  [name], [name] NO.:  [id number]   MEDICAL RECORD NO.:  [id number]          PATIENT TYPE:  OIB   LOCATION:  1962                         FACILITY:  [facility]   PHYSICIAN:  Lauree Chandler, MDDATE OF BIRTH:  Jul 01, 1940   DATE OF PROCEDURE:  09/26/2008  DATE OF DISCHARGE:  09/26/2008                            CARDIAC CATHETERIZATION   PRIMARY CARDIOLOGIST:  Lauree Chandler, MD   PROCEDURE PERFORMED:  1. Left heart catheterization.  2. Selective coronary angiography.   OPERATOR:  Lauree Chandler, MD   INDICATIONS:  This is a 70 year old Caucasian male with a prior history  of coronary artery disease with stent placement in the right coronary  artery who also has a history of diabetes mellitus and was seen in my  office last week with complaints of chest pain that were worrisome for  unstable angina.  The patient was brought in today for a diagnostic left  heart catheterization.   PROCEDURE IN DETAIL:  The patient was brought to the outpatient cardiac  catheterization laboratory after signing informed consent for the  procedure.  The right groin was prepped and draped in a sterile fashion.  Lidocaine 1% was used for local anesthesia.  A 4-French sheath was  inserted into the right femoral artery without difficulty.  Standard  diagnostic catheters were used to perform selective coronary  angiography.  There was some difficulty with passing a pigtail catheter  into the left ventricle secondary to tortuosity in the aortoiliac  system.  I was able to pass a wire up for catheter exchanges and  actually was able to pass a wire into the left ventricle but could not  maneuver the pigtail catheter into the left ventricle over the wire.  Plans will be made to perform left ventricular angiogram with a 6-French  catheter in the main cath lab later today.  The patient tolerated the  procedure well.  Plans are being made to perform percutaneous coronary  intervention of the right coronary artery in the main cath lab later  this morning.  The patient tolerated the procedure well.   FINDINGS:  Central aortic pressure 113/61.   ANGIOGRAPHIC FINDINGS:  1. The left main coronary artery had diffuse 20-30% disease through      the proximal and midportions and a 40% stenosis in the distal      portion.  2. The left anterior descending is a large vessel that courses at the      apex and gives off several diagonal branches.  There appears to be      a 30% ostial stenosis and a 40% stenosis in the midportion of the      vessel at the take off of a moderate-sized second diagonal branch.      First diagonal is small in caliber.  Second diagonal is moderate      size and has an ostial 40% stenosis.  3. The circumflex artery has a mid 40% stenosis and then has a      moderate-sized bifurcating obtuse marginal branch that has a 40%      stenosis.  4. The right coronary artery  is a dominant vessel that has a stented      proximal portion that appears to have moderate 40% in-stent      restenosis.  Beyond the stented segment in the mid vessel there is      a 99% subtotal occlusion.  There is TIMI-1 flow into the distal      right coronary artery.   No left ventricular angiogram was performed.   IMPRESSION:  1. Single-vessel coronary artery disease.  2. Unstable angina.   RECOMMENDATIONS:  We will give the patient 600 mg of Plavix now and 4000  units of heparin with his sheath in place.  We will plan a percutaneous  coronary intervention of the right coronary artery later this morning.  The patient will be transferred to our main cath lab for this procedure.      Lauree Chandler, MD  Electronically Signed     CM/MEDQ  D:  09/26/2008  T:  09/26/2008  Job:  [id number]

## 2010-07-22 ENCOUNTER — Encounter: Payer: Self-pay | Admitting: Family Medicine

## 2010-07-22 DIAGNOSIS — I152 Hypertension secondary to endocrine disorders: Secondary | ICD-10-CM | POA: Insufficient documentation

## 2010-07-22 DIAGNOSIS — E1159 Type 2 diabetes mellitus with other circulatory complications: Secondary | ICD-10-CM | POA: Insufficient documentation

## 2010-07-22 DIAGNOSIS — I10 Essential (primary) hypertension: Secondary | ICD-10-CM

## 2010-07-22 DIAGNOSIS — E669 Obesity, unspecified: Secondary | ICD-10-CM

## 2010-07-22 DIAGNOSIS — E785 Hyperlipidemia, unspecified: Secondary | ICD-10-CM

## 2010-07-22 DIAGNOSIS — I2510 Atherosclerotic heart disease of native coronary artery without angina pectoris: Secondary | ICD-10-CM

## 2010-07-22 DIAGNOSIS — M109 Gout, unspecified: Secondary | ICD-10-CM | POA: Insufficient documentation

## 2010-08-21 ENCOUNTER — Encounter: Payer: Self-pay | Admitting: Cardiovascular Disease

## 2010-08-24 ENCOUNTER — Telehealth: Payer: Self-pay | Admitting: Family Medicine

## 2010-08-24 ENCOUNTER — Encounter: Payer: Self-pay | Admitting: Family Medicine

## 2010-08-24 ENCOUNTER — Other Ambulatory Visit: Payer: Self-pay | Admitting: Family Medicine

## 2010-08-24 ENCOUNTER — Ambulatory Visit (INDEPENDENT_AMBULATORY_CARE_PROVIDER_SITE_OTHER): Payer: Medicare Other | Admitting: Family Medicine

## 2010-08-24 DIAGNOSIS — I10 Essential (primary) hypertension: Secondary | ICD-10-CM

## 2010-08-24 DIAGNOSIS — L309 Dermatitis, unspecified: Secondary | ICD-10-CM

## 2010-08-24 DIAGNOSIS — L259 Unspecified contact dermatitis, unspecified cause: Secondary | ICD-10-CM

## 2010-08-24 DIAGNOSIS — E785 Hyperlipidemia, unspecified: Secondary | ICD-10-CM

## 2010-08-24 DIAGNOSIS — I714 Abdominal aortic aneurysm, without rupture: Secondary | ICD-10-CM

## 2010-08-24 DIAGNOSIS — Z23 Encounter for immunization: Secondary | ICD-10-CM

## 2010-08-24 DIAGNOSIS — E1169 Type 2 diabetes mellitus with other specified complication: Secondary | ICD-10-CM

## 2010-08-24 DIAGNOSIS — R5383 Other fatigue: Secondary | ICD-10-CM

## 2010-08-24 DIAGNOSIS — R5381 Other malaise: Secondary | ICD-10-CM

## 2010-08-24 DIAGNOSIS — E1149 Type 2 diabetes mellitus with other diabetic neurological complication: Secondary | ICD-10-CM

## 2010-08-24 DIAGNOSIS — I152 Hypertension secondary to endocrine disorders: Secondary | ICD-10-CM

## 2010-08-24 DIAGNOSIS — Z136 Encounter for screening for cardiovascular disorders: Secondary | ICD-10-CM

## 2010-08-24 DIAGNOSIS — Z0000 Encounter for general adult medical examination without abnormal findings: Secondary | ICD-10-CM

## 2010-08-24 DIAGNOSIS — E1140 Type 2 diabetes mellitus with diabetic neuropathy, unspecified: Secondary | ICD-10-CM

## 2010-08-24 DIAGNOSIS — Z79899 Other long term (current) drug therapy: Secondary | ICD-10-CM

## 2010-08-24 DIAGNOSIS — I2510 Atherosclerotic heart disease of native coronary artery without angina pectoris: Secondary | ICD-10-CM

## 2010-08-24 DIAGNOSIS — E119 Type 2 diabetes mellitus without complications: Secondary | ICD-10-CM

## 2010-08-24 DIAGNOSIS — E1142 Type 2 diabetes mellitus with diabetic polyneuropathy: Secondary | ICD-10-CM

## 2010-08-24 DIAGNOSIS — E1159 Type 2 diabetes mellitus with other circulatory complications: Secondary | ICD-10-CM

## 2010-08-24 DIAGNOSIS — M109 Gout, unspecified: Secondary | ICD-10-CM

## 2010-08-24 LAB — COMPREHENSIVE METABOLIC PANEL
ALT: 15 U/L (ref 0–53)
AST: 15 U/L (ref 0–37)
Albumin: 3.9 g/dL (ref 3.5–5.2)
Alkaline Phosphatase: 43 U/L (ref 39–117)
BUN: 20 mg/dL (ref 6–23)
CO2: 27 mEq/L (ref 19–32)
Calcium: 9.2 mg/dL (ref 8.4–10.5)
Chloride: 106 mEq/L (ref 96–112)
Creat: 0.94 mg/dL (ref 0.50–1.35)
Glucose, Bld: 108 mg/dL — ABNORMAL HIGH (ref 70–99)
Potassium: 4.4 mEq/L (ref 3.5–5.3)
Sodium: 142 mEq/L (ref 135–145)
Total Bilirubin: 0.7 mg/dL (ref 0.3–1.2)
Total Protein: 6.5 g/dL (ref 6.0–8.3)

## 2010-08-24 LAB — CBC WITH DIFFERENTIAL/PLATELET
Basophils Absolute: 0 10*3/uL (ref 0.0–0.1)
Basophils Relative: 0 % (ref 0–1)
Eosinophils Absolute: 0.3 10*3/uL (ref 0.0–0.7)
Eosinophils Relative: 4 % (ref 0–5)
HCT: 38.5 % — ABNORMAL LOW (ref 39.0–52.0)
Hemoglobin: 13 g/dL (ref 13.0–17.0)
Lymphocytes Relative: 37 % (ref 12–46)
Lymphs Abs: 2.5 10*3/uL (ref 0.7–4.0)
MCH: 31.3 pg (ref 26.0–34.0)
MCHC: 33.8 g/dL (ref 30.0–36.0)
MCV: 92.5 fL (ref 78.0–100.0)
Monocytes Absolute: 0.6 10*3/uL (ref 0.1–1.0)
Monocytes Relative: 9 % (ref 3–12)
Neutro Abs: 3.4 10*3/uL (ref 1.7–7.7)
Neutrophils Relative %: 50 % (ref 43–77)
Platelets: 162 10*3/uL (ref 150–400)
RBC: 4.16 MIL/uL — ABNORMAL LOW (ref 4.22–5.81)
RDW: 15.8 % — ABNORMAL HIGH (ref 11.5–15.5)
WBC: 6.9 10*3/uL (ref 4.0–10.5)

## 2010-08-24 LAB — TESTOSTERONE: Testosterone: 336.36 ng/dL (ref 250–890)

## 2010-08-24 LAB — POCT URINALYSIS DIPSTICK
Bilirubin, UA: NEGATIVE
Blood, UA: NEGATIVE
Glucose, UA: NEGATIVE
Ketones, UA: NEGATIVE
Leukocytes, UA: NEGATIVE
Nitrite, UA: NEGATIVE
Protein, UA: NEGATIVE
Spec Grav, UA: 1.025
Urobilinogen, UA: NEGATIVE
pH, UA: 5

## 2010-08-24 LAB — LIPID PANEL
Cholesterol: 139 mg/dL (ref 0–200)
HDL: 38 mg/dL — ABNORMAL LOW (ref >39)
LDL Cholesterol: 78 mg/dL (ref 0–99)
Total CHOL/HDL Ratio: 3.7 Ratio
Triglycerides: 117 mg/dL (ref <150)
VLDL: 23 mg/dL (ref 0–40)

## 2010-08-24 LAB — POCT GLYCOSYLATED HEMOGLOBIN (HGB A1C): Hemoglobin A1C: 6.5

## 2010-08-24 LAB — TSH: TSH: 1.462 u[IU]/mL (ref 0.350–4.500)

## 2010-08-24 MED ORDER — GLUCOSAMINE-CHONDROITIN 500-400 MG PO TABS: 1.0000 | ORAL_TABLET | Freq: Two times a day (BID) | ORAL | Status: DC

## 2010-08-24 MED ORDER — CLOPIDOGREL BISULFATE 75 MG PO TABS: 75.0000 mg | ORAL_TABLET | Freq: Every day | ORAL | Status: DC

## 2010-08-24 MED ORDER — GABAPENTIN 100 MG PO CAPS: 60.0000 mg | ORAL_CAPSULE | Freq: Two times a day (BID) | ORAL | Status: DC

## 2010-08-24 MED ORDER — METFORMIN HCL 500 MG PO TABS: 500.0000 mg | ORAL_TABLET | Freq: Every day | ORAL | Status: DC

## 2010-08-24 MED ORDER — METOPROLOL TARTRATE 25 MG PO TABS: 25.0000 mg | ORAL_TABLET | Freq: Two times a day (BID) | ORAL | Status: DC

## 2010-08-24 MED ORDER — ALLOPURINOL 100 MG PO TABS: 100.0000 mg | ORAL_TABLET | Freq: Every day | ORAL | Status: DC

## 2010-08-24 MED ORDER — COLCHICINE 0.6 MG PO TABS: 0.6000 mg | ORAL_TABLET | Freq: Two times a day (BID) | ORAL | Status: DC

## 2010-08-24 MED ORDER — QUINAPRIL HCL 20 MG PO TABS: 20.0000 mg | ORAL_TABLET | Freq: Every day | ORAL | Status: DC

## 2010-08-24 MED ORDER — SIMVASTATIN 80 MG PO TABS: 80.0000 mg | ORAL_TABLET | Freq: Every day | ORAL | Status: DC

## 2010-08-24 NOTE — Progress Notes (Signed)
  Subjective:    Patient ID: Paul [name], male    DOB: 02-Dec-1940, 70 y.o.   MRN: [id number]  HPI He is here for a recheck. He continues on medications listed in the chart. He did have difficulty with gout attacks using the allopurinol however he did not use this appropriately. He continues on Neurontin for his underlying neuropathy. He will's scheduled to see his cardiologist in the near future. He does check his blood sugars fairly regularly. He does complain of fatigue but no shortness of breath, DOE, pain. He continues to have intermittent back pain. He lives in the [place] and is quite happy with his life there. He does complain of some skin issues and has been using over-the-counter medications for this.   Review of Systems Negative except as above    Objective:   Physical Exam BP 124/80  Pulse 68  Ht 5\' 10"  (1.778 m)  Wt 224 lb (101.606 kg)  BMI 32.14 kg/m2  General Appearance:    Alert, cooperative, no distress, appears stated age  Head:    Normocephalic, without obvious abnormality, atraumatic      Ears:    Normal TM's and external ear canals  Nose:   Nares normal, mucosa normal, no drainage or sinus   tenderness  Throat:   Lips, mucosa, and tongue normal; teeth and gums normal  Neck:   Supple, no lymphadenopathy;  thyroid:  no   enlargement/tenderness/nodules; no carotid   bruit or JVD  Back:    Spine nontender, no curvature, ROM normal, no CVA     tenderness  Lungs:     Clear to auscultation bilaterally without wheezes, rales or     ronchi; respirations unlabored  Chest Wall:    No tenderness or deformity   Heart:    Regular rate and rhythm, S1 and S2 normal, no murmur, rub   or gallop  Breast Exam:    No chest wall tenderness, masses or gynecomastia  Abdomen:     Soft, non-tender, nondistended, normoactive bowel sounds,    no masses, no hepatosplenomegaly      Rectal:    Normal sphincter tone, no masses or tenderness; guaiac negative stool.  Prostate smooth, no  nodules, not enlarged.  Extremities:   No clubbing, cyanosis or edema  Pulses:   2+ and symmetric all extremities  Skin:   Skin color, texture, turgor normal, no rashes or lesions  Lymph nodes:   Cervical, supraclavicular, and axillary nodes normal  Neurologic:   CNII-XII intact, normal strength, sensation and gait; lower extremity exam shows decreased vibratory as well as absence of ankle reflexes. 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           Psych:   Normal mood, affect, hygiene and grooming.           Assessment & Plan:  Diabetes. Diabetic neuropathy. Dermatitis. Obesity. Hypertension. Hyperlipidemia. Gout. Fatigue. Recommend using an antifungal on the skin lesion. I will increase his Neurontin to twice a day dosing. I will give him a new glucometer. Routine blood screening. He will followup with his cardiologist. Also discussed proper care for his gout in terms of increasing his allopurinol and using colchicine

## 2010-08-24 NOTE — Telephone Encounter (Signed)
i put a monitor up front they will pick up

## 2010-08-24 NOTE — Patient Instructions (Addendum)
Start with allopurinol 100 mg a day for a month then go to 200 per month and then 300. I will also give you: She seemed to take for the same timeframe to decrease her risk of getting an attack. I will increase her Neurontin to 2 pills per day and let me know if it helps with the pain. If it does not we will increase it to 3 pills a day. Use the antifungal cream on your feet and if no improvement, we will have you see a dermatologist

## 2010-08-25 ENCOUNTER — Telehealth: Payer: Self-pay

## 2010-08-25 NOTE — Telephone Encounter (Signed)
Called pt to inform labs normal he wants to pick up a copy of results

## 2010-08-25 NOTE — Telephone Encounter (Signed)
PER SYSTEM REFILL WAS SENT

## 2010-08-26 ENCOUNTER — Ambulatory Visit (INDEPENDENT_AMBULATORY_CARE_PROVIDER_SITE_OTHER): Payer: Medicare Other | Admitting: Cardiovascular Disease

## 2010-08-26 ENCOUNTER — Encounter: Payer: Self-pay | Admitting: Cardiovascular Disease

## 2010-08-26 VITALS — BP 129/75 | HR 64 | Ht 70.0 in | Wt 228.0 lb

## 2010-08-26 DIAGNOSIS — I2510 Atherosclerotic heart disease of native coronary artery without angina pectoris: Secondary | ICD-10-CM

## 2010-08-26 MED ORDER — SIMVASTATIN 80 MG PO TABS: 80.0000 mg | ORAL_TABLET | Freq: Every day | ORAL | Status: DC

## 2010-08-26 MED ORDER — CLOPIDOGREL BISULFATE 75 MG PO TABS: 75.0000 mg | ORAL_TABLET | Freq: Every day | ORAL | Status: DC

## 2010-08-26 MED ORDER — ISOSORBIDE MONONITRATE ER 30 MG PO TB24: 30.0000 mg | ORAL_TABLET | Freq: Every day | ORAL | Status: DC

## 2010-08-26 MED ORDER — METOPROLOL SUCCINATE ER 50 MG PO TB24: 50.0000 mg | ORAL_TABLET | Freq: Every day | ORAL | Status: DC

## 2010-08-26 MED ORDER — QUINAPRIL HCL 20 MG PO TABS: 20.0000 mg | ORAL_TABLET | Freq: Every day | ORAL | Status: DC

## 2010-08-26 NOTE — Assessment & Plan Note (Signed)
Stable. No changes in therapy. Continue ASA, Plavix, beta blocker, Ace-inhibitor, statin. I will see him in one year.

## 2010-08-26 NOTE — Progress Notes (Signed)
History of Present Illness:70 yo WM with history of CAD with prior stent therapy in the RCA, DM here today for cardiology follow up. I last saw him in August  2011.His echo in 2009 showed mild LVH and normal LV function with no significant valvular abnormalities. His cath iin 2009 showed a near total occlusion of the RCA beyond the stent in the mid portion. I attempted PCI and was able to balloon the mid lesion but could never pass a balloon distally. He did well after that. He lives in the [place].   He returns today for follow up and tells me he is doing well. He has had no chest pain,  palpitations, near syncope or syncope. Occasional SOB when he exerts himself. He is feeling great. He is heading back to the [place] in two weeks and will be back in [place] in July 2013. He exercises three times a week. 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    Past Medical History  Diagnosis Date  . Diabetes mellitus     with neuropathy  . Hypertension   . AAA (abdominal aortic aneurysm)   . Gout   . Vitamin B12 deficiency   . Hiatal hernia   . CAD (coronary artery disease)     with cath 09/26/08 showing severe heart disease in RCA with POBA, mild to mod diesease in circ and LAD  . Chronic back pain     back problmes. prior to surgery     Past Surgical History  Procedure Date  . Back surgery     x3  . Left ankle surgery   . Carpal tunnel release     L arm   . 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 aspirin 81 MG tablet Take 81 mg by mouth daily.        . clopidogrel (PLAVIX) 75 MG tablet Take 1 tablet (75 mg total) by mouth daily.  90 tablet  4  . fish oil-omega-3 fatty acids 1000 MG capsule Take 2 g by mouth daily.        Marland Kitchen gabapentin (NEURONTIN) 100 MG capsule Take 1 capsule (100 mg total) by mouth 2 (two) times daily.  180 capsule  4    . glucosamine-chondroitin 500-400 MG tablet Take 1 tablet by mouth 2 (two) times daily after a meal.  180 tablet  4  . isosorbide mononitrate (IMDUR) 30 MG 24 hr tablet Take 30 mg by mouth daily.        . metFORMIN (GLUCOPHAGE) 500 MG tablet Take 1 tablet (500 mg total) by mouth daily.  90 tablet  4  . nitroGLYCERIN (NITROSTAT) 0.4 MG SL tablet Place 0.4 mg under the tongue every 5 (five) minutes as needed.        . quinapril (ACCUPRIL) 20 MG tablet Take 1 tablet (20 mg total) by mouth at bedtime.  90 tablet  4  . simvastatin (ZOCOR) 80 MG tablet Take 1 tablet (80 mg total) by mouth at bedtime.  90 tablet  4  . allopurinol (ZYLOPRIM) 100 MG tablet Take 1 tablet (100 mg total) by mouth daily.  90 tablet  0  . colchicine 0.6 MG tablet Take 1 tablet (0.6 mg total) by mouth 2 (two) times daily.  180 tablet  0    No Known Allergies  History  Social History  . Marital Status: Divorced    Spouse Name: N/A    Number of Children: N/A  . Years of Education: N/A   Occupational History  . Not on file.   Social History Main Topics  . Smoking status: Current Some Day Smoker    Types: Cigars  . Smokeless tobacco: Never Used   Comment: tobacco use - no  . Alcohol Use: 0.6 oz/week    1 Glasses of wine per week  . Drug Use: No  . Sexually Active: Yes   Other Topics Concern  . Not on file   Social History Narrative   Divorced, 1 child. 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 Owned roofing co. In [place]    No family history on file.  Review of Systems:  As stated in the HPI and otherwise negative.   BP 129/75  Pulse 64  Ht 5\' 10"  (1.778 m)  Wt 228 lb (103.42 kg)  BMI 32.71 kg/m2  Physical Examination: General: Well developed, well nourished, NAD HEENT: OP clear, mucus membranes moist SKIN: warm, dry. No rashes. Neuro: No focal deficits Musculoskeletal: Muscle strength 5/5 all ext Psychiatric: Mood and affect normal Neck: No JVD, no carotid bruits, no  thyromegaly, no lymphadenopathy. Lungs:Clear bilaterally, no wheezes, rhonci, crackles Cardiovascular: Regular rate and rhythm. No murmurs, gallops or rubs. Abdomen:Soft. Bowel sounds present. Non-tender.  Extremities: No lower extremity edema. Pulses are 2 + in the bilateral DP/PT.  EKG:NSR, rate 61 bpm. Normal EKG.

## 2010-08-26 NOTE — Patient Instructions (Signed)
Your physician recommends that you schedule a follow-up appointment in: 1 year  

## 2010-08-28 NOTE — Progress Notes (Signed)
Addended by: Randel Books on: 08/28/2010 11:53 AM   Modules accepted: Orders

## 2010-08-31 ENCOUNTER — Encounter: Payer: Self-pay | Admitting: Family Medicine

## 2010-08-31 ENCOUNTER — Other Ambulatory Visit (HOSPITAL_COMMUNITY): Payer: Medicare Other

## 2010-08-31 ENCOUNTER — Ambulatory Visit (HOSPITAL_COMMUNITY)
Admission: RE | Admit: 2010-08-31 | Discharge: 2010-08-31 | Disposition: A | Discharge status: Home / Self Care | Payer: Medicare Other | Source: Ambulatory Visit | Attending: Family Medicine | Admitting: Family Medicine

## 2010-08-31 ENCOUNTER — Ambulatory Visit (INDEPENDENT_AMBULATORY_CARE_PROVIDER_SITE_OTHER): Payer: Medicare Other | Admitting: Family Medicine

## 2010-08-31 VITALS — BP 100/60 | HR 60 | Wt 223.0 lb

## 2010-08-31 DIAGNOSIS — I714 Abdominal aortic aneurysm, without rupture, unspecified: Secondary | ICD-10-CM | POA: Insufficient documentation

## 2010-08-31 DIAGNOSIS — Z09 Encounter for follow-up examination after completed treatment for conditions other than malignant neoplasm: Secondary | ICD-10-CM | POA: Insufficient documentation

## 2010-08-31 DIAGNOSIS — B029 Zoster without complications: Secondary | ICD-10-CM

## 2010-08-31 MED ORDER — VALACYCLOVIR HCL 1 G PO TABS: 1000.0000 mg | ORAL_TABLET | Freq: Three times a day (TID) | ORAL | Status: AC

## 2010-08-31 NOTE — Progress Notes (Signed)
  Subjective:    Patient ID: Paul [name], male    DOB: 27-Jun-1940, 70 y.o.   MRN: [id number]  HPI He is here for consult concerning a rash present on the left neck for the last day or so. He also feels some slight tingling and discomfort in the left occipital area.   Review of Systems     Objective:   Physical Exam Alert and in no distress. Erythematous raised lesion is noted in the left neck area in the submandibular area. It does go to the midline.       Assessment & Plan:  Shingles I will treat with Valtrex. Discussed shingles with him. Recommend Advil or Aleve for pain and call me if still having difficulty. Also discussed the possibility of PHN with him

## 2010-08-31 NOTE — Patient Instructions (Signed)
Take the medicine for one week and if the pain gets bad and not controlled with Advil or Aleve call me

## 2010-09-01 ENCOUNTER — Telehealth: Payer: Self-pay

## 2010-09-01 NOTE — Telephone Encounter (Signed)
Pt has been informed.

## 2010-09-01 NOTE — Telephone Encounter (Signed)
Called pt to give results of but left message for him to call me back

## 2011-02-04 ENCOUNTER — Telehealth: Payer: Self-pay | Admitting: Family Medicine

## 2011-02-04 MED ORDER — ALLOPURINOL 100 MG PO TABS: 100.0000 mg | ORAL_TABLET | Freq: Every day | ORAL | Status: DC

## 2011-02-04 NOTE — Telephone Encounter (Signed)
Allopurinol renewed

## 2011-07-26 ENCOUNTER — Encounter: Payer: Self-pay | Admitting: Internal Medicine

## 2011-07-28 ENCOUNTER — Encounter: Payer: Medicare Other | Admitting: Family Medicine

## 2011-07-29 ENCOUNTER — Encounter: Payer: Medicare Other | Admitting: Family Medicine

## 2011-08-03 ENCOUNTER — Ambulatory Visit: Payer: Medicare Other | Admitting: Cardiovascular Disease

## 2011-08-04 ENCOUNTER — Ambulatory Visit: Payer: Medicare Other | Admitting: Cardiovascular Disease

## 2011-08-26 ENCOUNTER — Encounter: Payer: Self-pay | Admitting: Family Medicine

## 2011-08-26 ENCOUNTER — Ambulatory Visit (INDEPENDENT_AMBULATORY_CARE_PROVIDER_SITE_OTHER): Payer: Medicare Other | Admitting: Family Medicine

## 2011-08-26 VITALS — BP 126/70 | HR 76 | Wt 230.0 lb

## 2011-08-26 DIAGNOSIS — T879 Unspecified complications of amputation stump: Secondary | ICD-10-CM

## 2011-08-26 NOTE — Progress Notes (Signed)
  Subjective:    Patient ID: Paul [name], male    DOB: [birth year], 71 y.o.   MRN: [id number]  HPI  he had his right great toe amputated several years ago and did have some grafting done. Since then he has had some os formation in that area that he keep shave down. Approximately one week ago he noted bleeding in the area. He has no pain due to neuropathy.   Review of Systems     Objective:   Physical Exam Right foot exam shows recent bleeding and callus formation       Assessment & Plan:  Possible cellulitis. Refer to orthopedics.

## 2011-08-30 ENCOUNTER — Ambulatory Visit
Admission: RE | Admit: 2011-08-30 | Discharge: 2011-08-30 | Disposition: A | Discharge status: Home / Self Care | Payer: Medicare Other | Source: Ambulatory Visit | Attending: Orthopedic Surgery | Admitting: Orthopedic Surgery

## 2011-08-30 ENCOUNTER — Other Ambulatory Visit: Payer: Self-pay | Admitting: Orthopedic Surgery

## 2011-08-30 DIAGNOSIS — M79673 Pain in unspecified foot: Secondary | ICD-10-CM

## 2011-08-30 MED ORDER — GADOBENATE DIMEGLUMINE 529 MG/ML IV SOLN
10.0000 mL | Freq: Once | INTRAVENOUS | Status: AC | PRN
  Administered 2011-08-30: 10 mL via INTRAVENOUS

## 2011-09-06 ENCOUNTER — Encounter: Payer: Self-pay | Admitting: Family Medicine

## 2011-09-06 ENCOUNTER — Ambulatory Visit (INDEPENDENT_AMBULATORY_CARE_PROVIDER_SITE_OTHER): Payer: Medicare Other | Admitting: Family Medicine

## 2011-09-06 VITALS — BP 100/70 | HR 58 | Ht 70.0 in | Wt 233.0 lb

## 2011-09-06 DIAGNOSIS — E119 Type 2 diabetes mellitus without complications: Secondary | ICD-10-CM

## 2011-09-06 DIAGNOSIS — I714 Abdominal aortic aneurysm, without rupture: Secondary | ICD-10-CM

## 2011-09-06 DIAGNOSIS — E669 Obesity, unspecified: Secondary | ICD-10-CM

## 2011-09-06 DIAGNOSIS — Z0000 Encounter for general adult medical examination without abnormal findings: Secondary | ICD-10-CM

## 2011-09-06 DIAGNOSIS — I10 Essential (primary) hypertension: Secondary | ICD-10-CM

## 2011-09-06 DIAGNOSIS — M109 Gout, unspecified: Secondary | ICD-10-CM

## 2011-09-06 DIAGNOSIS — Z23 Encounter for immunization: Secondary | ICD-10-CM

## 2011-09-06 DIAGNOSIS — E785 Hyperlipidemia, unspecified: Secondary | ICD-10-CM

## 2011-09-06 DIAGNOSIS — I2510 Atherosclerotic heart disease of native coronary artery without angina pectoris: Secondary | ICD-10-CM

## 2011-09-06 LAB — CBC WITH DIFFERENTIAL/PLATELET
Basophils Absolute: 0 10*3/uL (ref 0.0–0.1)
Basophils Relative: 0 % (ref 0–1)
Eosinophils Absolute: 0.3 10*3/uL (ref 0.0–0.7)
Eosinophils Relative: 4 % (ref 0–5)
HCT: 36.6 % — ABNORMAL LOW (ref 39.0–52.0)
Hemoglobin: 12.6 g/dL — ABNORMAL LOW (ref 13.0–17.0)
Lymphocytes Relative: 40 % (ref 12–46)
Lymphs Abs: 2.9 10*3/uL (ref 0.7–4.0)
MCH: 32.5 pg (ref 26.0–34.0)
MCHC: 34.4 g/dL (ref 30.0–36.0)
MCV: 94.3 fL (ref 78.0–100.0)
Monocytes Absolute: 0.6 10*3/uL (ref 0.1–1.0)
Monocytes Relative: 9 % (ref 3–12)
Neutro Abs: 3.4 10*3/uL (ref 1.7–7.7)
Neutrophils Relative %: 47 % (ref 43–77)
Platelets: 139 10*3/uL — ABNORMAL LOW (ref 150–400)
RBC: 3.88 MIL/uL — ABNORMAL LOW (ref 4.22–5.81)
RDW: 16 % — ABNORMAL HIGH (ref 11.5–15.5)
WBC: 7.2 10*3/uL (ref 4.0–10.5)

## 2011-09-06 LAB — POCT URINALYSIS DIPSTICK
Bilirubin, UA: NEGATIVE
Blood, UA: NEGATIVE
Glucose, UA: NEGATIVE
Ketones, UA: NEGATIVE
Leukocytes, UA: NEGATIVE
Nitrite, UA: NEGATIVE
Protein, UA: NEGATIVE
Spec Grav, UA: 1.02
Urobilinogen, UA: NEGATIVE
pH, UA: 5

## 2011-09-06 MED ORDER — ALLOPURINOL 300 MG PO TABS: 300.0000 mg | ORAL_TABLET | Freq: Every day | ORAL | Status: DC

## 2011-09-06 NOTE — Progress Notes (Signed)
Subjective:    Patient ID: Paul [name], male    DOB: 1940-07-16, 71 y.o.   MRN: [id number]  HPI He is here for his annual exam. He was recently seen by orthopedics and is scheduled for revision of his right great toe amputation as well as left shoulder surgery. He will see his cardiologist in the near future. He is apparently on the wrong dosing of allopurinol and this will be corrected. He also has an underlying history of AAA and will have followup ultrasound. He continues on very low dose of Neurontin for his neuropathy and seems to be doing well on this. He does have underlying arthritis and plans to use Aleve for this. He has lesions on his lower legs and he would like me to look at. He continues on medications listed in the chart. His social and family history were reviewed. He plans to not return to the [place].   Review of Systems Negative except as above    Objective:   Physical Exam BP 100/70  Pulse 58  Ht 5\' 10"  (1.778 m)  Wt 233 lb (105.688 kg)  BMI 33.43 kg/m2  General Appearance:    Alert, cooperative, no distress, appears stated age  Head:    Normocephalic, without obvious abnormality, atraumatic  Eyes:    PERRL, conjunctiva/corneas clear, EOM's intact, fundi    benign  Ears:    Normal TM's and external ear canals  Nose:   Nares normal, mucosa normal, no drainage or sinus   tenderness  Throat:   Lips, mucosa, and tongue normal; teeth and gums normal  Neck:   Supple, no lymphadenopathy;  thyroid:  no   enlargement/tenderness/nodules; no carotid   bruit or JVD  Back:    Spine nontender, no curvature, ROM normal, no CVA     tenderness  Lungs:     Clear to auscultation bilaterally without wheezes, rales or     ronchi; respirations unlabored  Chest Wall:    No tenderness or deformity   Heart:    Regular rate and rhythm, S1 and S2 normal, no murmur, rub   or gallop  Breast Exam:    No chest wall tenderness, masses or gynecomastia  Abdomen:     Soft, non-tender,  nondistended, normoactive bowel sounds,    no masses, no hepatosplenomegaly  Genitalia:   deferred   Rectal:    deferred   Extremities:   No clubbing, cyanosis or edema  Pulses:   2+ and symmetric all extremities  Skin:   Skin color, texture, turgor normal, no rashes or lesions  Lymph nodes:   Cervical, supraclavicular, and axillary nodes normal  Neurologic:   CNII-XII intact, normal strength, sensation and gait; reflexes 2+ and symmetric throughout          Psych:   Normal mood, affect, hygiene and grooming.          Assessment & Plan:   1. Routine general medical examination at a health care facility  POCT Urinalysis Dipstick, Pneumococcal polysaccharide vaccine 23-valent greater than or equal to 69yo subcutaneous/IM, CBC with Differential, Comprehensive metabolic panel, Lipid panel  2. Gout  allopurinol (ZYLOPRIM) 300 MG tablet  3. AAA (abdominal aortic aneurysm)  US Aorta  4. ASHD (arteriosclerotic heart disease)    5. Diabetes mellitus  HgB A1c, POCT UA - Microalbumin  6. Dyslipidemia    7. HTN (hypertension)    8. Obesity     he is stable on his present medications. Allopurinol called in. He will continue  on Neurontin at the present dosing.

## 2011-09-07 ENCOUNTER — Ambulatory Visit (INDEPENDENT_AMBULATORY_CARE_PROVIDER_SITE_OTHER): Payer: Medicare Other | Admitting: Physician Assistant

## 2011-09-07 ENCOUNTER — Encounter: Payer: Self-pay | Admitting: Physician Assistant

## 2011-09-07 VITALS — BP 122/75 | HR 66 | Ht 70.0 in | Wt 230.4 lb

## 2011-09-07 DIAGNOSIS — I2510 Atherosclerotic heart disease of native coronary artery without angina pectoris: Secondary | ICD-10-CM

## 2011-09-07 DIAGNOSIS — I10 Essential (primary) hypertension: Secondary | ICD-10-CM

## 2011-09-07 DIAGNOSIS — Z01810 Encounter for preprocedural cardiovascular examination: Secondary | ICD-10-CM

## 2011-09-07 DIAGNOSIS — E785 Hyperlipidemia, unspecified: Secondary | ICD-10-CM

## 2011-09-07 LAB — COMPREHENSIVE METABOLIC PANEL
ALT: 21 U/L (ref 0–53)
AST: 24 U/L (ref 0–37)
Albumin: 3.6 g/dL (ref 3.5–5.2)
Alkaline Phosphatase: 46 U/L (ref 39–117)
BUN: 25 mg/dL — ABNORMAL HIGH (ref 6–23)
CO2: 23 mEq/L (ref 19–32)
Calcium: 9.2 mg/dL (ref 8.4–10.5)
Chloride: 108 mEq/L (ref 96–112)
Creat: 1.05 mg/dL (ref 0.50–1.35)
Glucose, Bld: 120 mg/dL — ABNORMAL HIGH (ref 70–99)
Potassium: 4.8 mEq/L (ref 3.5–5.3)
Sodium: 140 mEq/L (ref 135–145)
Total Bilirubin: 0.5 mg/dL (ref 0.3–1.2)
Total Protein: 6.5 g/dL (ref 6.0–8.3)

## 2011-09-07 LAB — LIPID PANEL
Cholesterol: 134 mg/dL (ref 0–200)
HDL: 33 mg/dL — ABNORMAL LOW (ref >39)
LDL Cholesterol: 75 mg/dL (ref 0–99)
Total CHOL/HDL Ratio: 4.1 Ratio
Triglycerides: 132 mg/dL (ref <150)
VLDL: 26 mg/dL (ref 0–40)

## 2011-09-07 LAB — HEMOGLOBIN A1C
Hgb A1c MFr Bld: 6.7 % — ABNORMAL HIGH (ref <5.7)
Mean Plasma Glucose: 146 mg/dL — ABNORMAL HIGH (ref <117)

## 2011-09-07 NOTE — Progress Notes (Signed)
[name], [name]  [address] Phone: [phone] Fax:  [phone]  Date:  09/07/2011   Name:  Paul [name]   DOB:  [birth year]   MRN:  [id number]  PCP:  Wyatt Haste, MD  Primary Cardiologist:  Dr. Lauree Chandler  Primary Electrophysiologist:  None    History of Present Illness: Paul [name] is a 71 y.o. male who returns for followup and surgical clearance.  He has a history of CAD, s/p prior stenting to the RCA, DM, HTN, AAA.   LHC 09/2008: LM 20-30% proximal-mid, dLM 40%, oLAD 30%, mLAD 40% oD2 40%, mCFX 40% OM 40%, pRCA stent patent with 40% ISR, mRCA beyond stent 99% subtotal occlusion.  He had POBA of the mid RCA lesion but a balloon could never be passed to the distal portion.   Last echo 09/2008: Mild LVH, EF 60%, trivial AI, trivial MR, trivial TR, PASP 36. Myoview 09/2008: EF 56%, no ischemia.  Last seen by Dr. Lauree Chandler 08/2010. Plan was for one year followup. He needs rotator cuff surgery on his left shoulder and debridement of the bone in his right foot where his toe was amputated years ago.  His surgeon is Dr. Sharol Given.  He denies any recent chest pain or shortness of breath. He did have an episode of chest tightness several months ago while walking [place] in the [place]. This was short lived. Otherwise, he can achieve 4 METs or greater without chest pain or shortness of breath. He denies syncope. He denies orthopnea, PND or edema.   Past Medical History  Diagnosis Date  . Diabetes mellitus     with neuropathy  . Hypertension   . AAA (abdominal aortic aneurysm)   . Gout   . Vitamin B12 deficiency   . Hiatal hernia   . CAD (coronary artery disease)     with cath 09/26/08 showing severe heart disease in RCA with POBA, mild to mod diesease in circ and LAD  . Chronic back pain     back problmes. prior to surgery     Current Outpatient Prescriptions  Medication Sig Dispense Refill  .  allopurinol (ZYLOPRIM) 300 MG tablet Take 1 tablet (300 mg total) by mouth daily.  90 tablet  3  . aspirin 81 MG tablet Take 81 mg by mouth daily.        . clopidogrel (PLAVIX) 75 MG tablet Take 1 tablet (75 mg total) by mouth daily.  90 tablet  4  . colchicine 0.6 MG tablet Take 1 tablet (0.6 mg total) by mouth 2 (two) times daily.  180 tablet  0  . fish oil-omega-3 fatty acids 1000 MG capsule Take 2 g by mouth daily.        Marland Kitchen gabapentin (NEURONTIN) 100 MG capsule Take 1 capsule (100 mg total) by mouth 2 (two) times daily.  180 capsule  4  . glucosamine-chondroitin 500-400 MG tablet Take 1 tablet by mouth 2 (two) times daily after a meal.  180 tablet  4  . isosorbide mononitrate (IMDUR) 30 MG 24 hr tablet Take 1 tablet (30 mg total) by mouth daily.  90 tablet  4  . metFORMIN (GLUCOPHAGE) 500 MG tablet Take 1 tablet (500 mg total) by mouth daily.  90 tablet  4  . metoprolol (TOPROL XL) 50 MG 24 hr tablet Take 1 tablet (50 mg total) by mouth daily.  90 tablet  4  . nitroGLYCERIN (NITROSTAT) 0.4 MG SL tablet Place 0.4 mg  under the tongue every 5 (five) minutes as needed.        . quinapril (ACCUPRIL) 20 MG tablet Take 1 tablet (20 mg total) by mouth at bedtime.  90 tablet  4  . simvastatin (ZOCOR) 80 MG tablet Take 1 tablet (80 mg total) by mouth at bedtime.  90 tablet  4    Allergies: No Known Allergies  History  Substance Use Topics  . Smoking status: Current Some Day Smoker    Types: Cigars  . Smokeless tobacco: Never Used   Comment: tobacco use - no  . Alcohol Use: 0.6 oz/week    1 Glasses of wine per week     ROS:  Please see the history of present illness.    All other systems reviewed and negative.   PHYSICAL EXAM: VS:  BP 122/75  Pulse 66  Ht 5\' 10"  (1.778 m)  Wt 230 lb 6.4 oz (104.509 kg)  BMI 33.06 kg/m2 Well nourished, well developed, in no acute distress HEENT: normal Neck: no JVD Vascular: No carotid bruits Cardiac:  normal S1, S2; RRR; no murmur Lungs:  clear to  auscultation bilaterally, no wheezing, rhonchi or rales Abd: soft, nontender, no hepatomegaly Ext: no edema Skin: warm and dry Neuro:  CNs 2-12 intact, no focal abnormalities noted  EKG:  Sinus bradycardia, heart rate 59, normal axis, poor r wave progression, PACs, no change from prior tracing      ASSESSMENT AND PLAN:  1. CAD Stable. No angina. He does not have any unstable symptoms. He should he able to proceed with his surgical procedure at acceptable risk. I discussed this with Dr. Lauree Chandler, who agreed. We would prefer he remain on at least aspirin throughout the perioperative period. He should also remain on his beta blocker therapy. Our service is available in the perioperative period as necessary.  He can followup with Dr. Lauree Chandler in one year.  2. Hypertension Controlled.  3. Hyperlipidemia He has been on high-dose simvastatin for many years. He has been able to tolerate this. Recent lipid panel acceptable.  Lab Results  Component Value Date   CHOL 134 09/06/2011   HDL 33* 09/06/2011   LDLCALC 75 09/06/2011   TRIG 132 09/06/2011   CHOLHDL 4.1 09/06/2011    4. Tobacco Abuse We discussed the importance of cessation.     Danton Sewer, PA-C  9:23 AM 09/07/2011

## 2011-09-07 NOTE — Progress Notes (Signed)
left message on cell # labs unchanged continue present meds

## 2011-09-07 NOTE — Patient Instructions (Addendum)
Your physician wants you to follow-up in: 12 months with Dr. Angelena Form.  You will receive a reminder letter in the mail two months in advance. If you don't receive a letter, please call our office to schedule the follow-up appointment.

## 2011-09-08 ENCOUNTER — Ambulatory Visit (HOSPITAL_COMMUNITY)
Admission: RE | Admit: 2011-09-08 | Discharge: 2011-09-08 | Disposition: A | Discharge status: Home / Self Care | Payer: Medicare Other | Source: Ambulatory Visit | Attending: Family Medicine | Admitting: Family Medicine

## 2011-09-08 DIAGNOSIS — I714 Abdominal aortic aneurysm, without rupture, unspecified: Secondary | ICD-10-CM | POA: Insufficient documentation

## 2011-09-08 DIAGNOSIS — Z09 Encounter for follow-up examination after completed treatment for conditions other than malignant neoplasm: Secondary | ICD-10-CM | POA: Insufficient documentation

## 2011-09-08 NOTE — Progress Notes (Signed)
Left message for pt of [place] no significant changes and to follow up in 1 year

## 2011-09-20 ENCOUNTER — Ambulatory Visit: Payer: Medicare Other | Admitting: Cardiovascular Disease

## 2011-10-12 ENCOUNTER — Telehealth: Payer: Self-pay | Admitting: Internal Medicine

## 2011-10-12 DIAGNOSIS — M109 Gout, unspecified: Secondary | ICD-10-CM

## 2011-10-12 DIAGNOSIS — I2510 Atherosclerotic heart disease of native coronary artery without angina pectoris: Secondary | ICD-10-CM

## 2011-10-12 MED ORDER — CLOPIDOGREL BISULFATE 75 MG PO TABS: 75.0000 mg | ORAL_TABLET | Freq: Every day | ORAL | Status: DC

## 2011-10-12 MED ORDER — NITROGLYCERIN 0.4 MG SL SUBL: 0.4000 mg | SUBLINGUAL_TABLET | SUBLINGUAL | Status: DC | PRN

## 2011-10-12 MED ORDER — COLCHICINE 0.6 MG PO TABS: 0.6000 mg | ORAL_TABLET | Freq: Two times a day (BID) | ORAL | Status: DC

## 2011-10-12 MED ORDER — GABAPENTIN 100 MG PO CAPS: 60.0000 mg | ORAL_CAPSULE | Freq: Two times a day (BID) | ORAL | Status: DC

## 2011-10-12 MED ORDER — SIMVASTATIN 80 MG PO TABS: 80.0000 mg | ORAL_TABLET | Freq: Every day | ORAL | Status: DC

## 2011-10-12 MED ORDER — METFORMIN HCL 500 MG PO TABS: 500.0000 mg | ORAL_TABLET | Freq: Every day | ORAL | Status: DC

## 2011-10-12 MED ORDER — METOPROLOL SUCCINATE ER 50 MG PO TB24: 50.0000 mg | ORAL_TABLET | Freq: Every day | ORAL | Status: DC

## 2011-10-12 MED ORDER — ALLOPURINOL 300 MG PO TABS: 300.0000 mg | ORAL_TABLET | Freq: Every day | ORAL | Status: DC

## 2011-10-12 MED ORDER — ISOSORBIDE MONONITRATE ER 30 MG PO TB24: 30.0000 mg | ORAL_TABLET | Freq: Every day | ORAL | Status: DC

## 2011-10-12 MED ORDER — QUINAPRIL HCL 20 MG PO TABS: 20.0000 mg | ORAL_TABLET | Freq: Every day | ORAL | Status: DC

## 2011-10-12 NOTE — Telephone Encounter (Signed)
[place]

## 2011-10-12 NOTE — Telephone Encounter (Signed)
Renew all these for a full year

## 2011-10-12 NOTE — Telephone Encounter (Signed)
Refill request for the following meds. accupril 20mg  #90, Metoprolol 25mg  #180, Plavix 75mg  #90, metformin ER 500mg  #180, Simvastating 80mg  #90mg , Gabapentin 600mg  #180, colcrys 0.6mg  #180. [facility] to [facility].

## 2011-11-05 ENCOUNTER — Ambulatory Visit (INDEPENDENT_AMBULATORY_CARE_PROVIDER_SITE_OTHER): Payer: Medicare Other | Admitting: Family Medicine

## 2011-11-05 DIAGNOSIS — E1140 Type 2 diabetes mellitus with diabetic neuropathy, unspecified: Secondary | ICD-10-CM

## 2011-11-05 DIAGNOSIS — E1142 Type 2 diabetes mellitus with diabetic polyneuropathy: Secondary | ICD-10-CM

## 2011-11-05 DIAGNOSIS — E1149 Type 2 diabetes mellitus with other diabetic neurological complication: Secondary | ICD-10-CM

## 2011-11-05 DIAGNOSIS — E119 Type 2 diabetes mellitus without complications: Secondary | ICD-10-CM

## 2011-11-05 MED ORDER — METFORMIN HCL ER 500 MG PO TB24: 500.0000 mg | ORAL_TABLET | Freq: Every day | ORAL | Status: DC

## 2011-11-05 MED ORDER — METOPROLOL TARTRATE 25 MG PO TABS: 25.0000 mg | ORAL_TABLET | Freq: Two times a day (BID) | ORAL | Status: DC

## 2011-11-05 NOTE — Progress Notes (Signed)
  Subjective:    Patient ID: Paul [name], male    DOB: 10-19-1940, 71 y.o.   MRN: [id number]  HPI He is here for medication reconciliation. He would like to go back on metoprolol tartrate rather than succinate to help save money. Also there was a mixup with his gabapentin. He would like to stop the medication entirely and see how he does. Records indicate in the past he was actually taking a total of 1200 mg of gabapentin. He was also getting the wrong dosing of metformin. He is to be an extended release twice per day  Review of Systems     Objective:   Physical Exam Alert and in no distress otherwise not examined       Assessment & Plan:   1. Diabetes mellitus  metoprolol tartrate (LOPRESSOR) 25 MG tablet, metFORMIN (GLUCOPHAGE-XR) 500 MG 24 hr tablet  2. Diabetic neuropathy     he would like to stop his Neurontin and see how he does. He is to call me if his symptoms worsen .

## 2011-11-05 NOTE — Patient Instructions (Signed)
If the nerve pain gets worse call me and we will start you back on the Neurontin

## 2011-12-07 ENCOUNTER — Telehealth: Payer: Self-pay | Admitting: Family Medicine

## 2011-12-07 NOTE — Telephone Encounter (Signed)
Pt stopped by with a form to be filled out for diabetic inserts. He states that Dr. Sharol Given gave it to him to be filled out. Pt states that Dr. Sharol Given wants him to have it but cant fill it out because you are his diabetic doc. Sending form back on his chart to be completed and then needs to be faxed to the [facility] number highlighted at bottom of form.

## 2012-01-17 ENCOUNTER — Other Ambulatory Visit: Payer: Self-pay

## 2012-01-17 ENCOUNTER — Encounter: Payer: Self-pay | Admitting: Family Medicine

## 2012-01-17 ENCOUNTER — Ambulatory Visit
Admission: RE | Admit: 2012-01-17 | Discharge: 2012-01-17 | Disposition: A | Discharge status: Home / Self Care | Payer: Medicare Other | Source: Ambulatory Visit | Attending: Family Medicine | Admitting: Family Medicine

## 2012-01-17 ENCOUNTER — Ambulatory Visit (INDEPENDENT_AMBULATORY_CARE_PROVIDER_SITE_OTHER): Payer: Medicare Other | Admitting: Family Medicine

## 2012-01-17 VITALS — BP 130/74 | HR 77 | Wt 233.0 lb

## 2012-01-17 DIAGNOSIS — I2510 Atherosclerotic heart disease of native coronary artery without angina pectoris: Secondary | ICD-10-CM

## 2012-01-17 DIAGNOSIS — I10 Essential (primary) hypertension: Secondary | ICD-10-CM

## 2012-01-17 DIAGNOSIS — E119 Type 2 diabetes mellitus without complications: Secondary | ICD-10-CM

## 2012-01-17 DIAGNOSIS — M109 Gout, unspecified: Secondary | ICD-10-CM

## 2012-01-17 DIAGNOSIS — E1142 Type 2 diabetes mellitus with diabetic polyneuropathy: Secondary | ICD-10-CM

## 2012-01-17 DIAGNOSIS — E1149 Type 2 diabetes mellitus with other diabetic neurological complication: Secondary | ICD-10-CM

## 2012-01-17 DIAGNOSIS — E1140 Type 2 diabetes mellitus with diabetic neuropathy, unspecified: Secondary | ICD-10-CM

## 2012-01-17 LAB — URIC ACID: Uric Acid, Serum: 6.4 mg/dL (ref 4.0–7.8)

## 2012-01-17 LAB — POCT GLYCOSYLATED HEMOGLOBIN (HGB A1C): Hemoglobin A1C: 6.8

## 2012-01-17 MED ORDER — COLCHICINE 0.6 MG PO TABS: 0.6000 mg | ORAL_TABLET | Freq: Two times a day (BID) | ORAL | Status: DC

## 2012-01-17 MED ORDER — GLUCOSE BLOOD VI STRP: ORAL_STRIP | Status: DC

## 2012-01-17 NOTE — Telephone Encounter (Signed)
Ordered pt's testing strips

## 2012-01-17 NOTE — Patient Instructions (Signed)
Inspection your feet regularly.

## 2012-01-17 NOTE — Progress Notes (Signed)
  Subjective:    Patient ID: Paul [name], male    DOB: 1940/05/24, 71 y.o.   MRN: [id number]  HPI He is here for recheck. He has an eye exam set up for early January. He needs more test strips called in. He does check his blood sugars intermittently. Social history was reviewed in regard to smoking and drinking. He has noted some slight shortness of breath and plans to see his cardiologist. He does not describe dyspnea on exertion or PND. He complains of bilateral knee pain left greater than right as well as an effusion. He did run out of colchicine. He also status and ankles" he has a previous history of gout. He stop taking his Neurontin and states he cannot tell any difference in his symptoms in his feet. He describes a numb tingling sensation in his feet but not enough to warrant being placed on another medication. He continues to be followed by Dr. Sharol Given transportation of the right great toe.   Review of Systems     Objective:   Physical Exam Alert and in no distress. Exam of the left knee does show an effusion but it is not hot or tender. Review his record indicates no recent uric acid level. Exam of right foot shows a very slowly healing wound where the great toe was. Hemoglobin A1c is 6.8. The knee x-ray shows questionable CPPD    Assessment & Plan:   1. Diabetes mellitus  POCT glycosylated hemoglobin (Hb A1C), Ambulatory referral to diabetic education  2. Diabetic neuropathy    3. Coronary atherosclerosis of native coronary artery    4. Gout  colchicine 0.6 MG tablet, DG Knee 1-2 Views Left, Uric Acid  5. HTN (hypertension)     I will have him return later in the week for joint aspiration to look for gout versus CPPD.

## 2012-01-18 ENCOUNTER — Telehealth: Payer: Self-pay | Admitting: Cardiovascular Disease

## 2012-01-18 NOTE — Telephone Encounter (Signed)
Spoke to patient stated he has been having palpitations,chest pain,sob with exertion.Stated this started 5 to 10 days ago.States not having any pain this morning.Has appointment with Dr.McAlhany 01/20/12.Advised to go to [facility] ER if needed.

## 2012-01-18 NOTE — Telephone Encounter (Signed)
New problem:   C/O tightness in chest & sob. appt made for 12/19 with Dr. Angelena Form.

## 2012-01-19 ENCOUNTER — Ambulatory Visit (INDEPENDENT_AMBULATORY_CARE_PROVIDER_SITE_OTHER): Payer: Medicare Other | Admitting: Family Medicine

## 2012-01-19 DIAGNOSIS — M25469 Effusion, unspecified knee: Secondary | ICD-10-CM

## 2012-01-19 NOTE — Progress Notes (Signed)
  Subjective:    Patient ID: Paul [name], male    DOB: [birth year], 71 y.o.   MRN: [id number]  HPI He is here for a recheck. Recent x-ray did show evidence of possible CPPD. He does have a previous history of gout. Recent uric acid level was normal. He is here for joint aspiration to help clear up the exact etiology of his joint effusion.   Review of Systems     Objective:   Physical Exam Left knee does have a moderate effusion. He was prepped laterally. Xylocaine was injected into the skin. 15 cc of clear yellow fluid was removed from the knee without difficulty.       Assessment & Plan:   1. Joint effusion of knee  Synovial fluid, crystal

## 2012-01-20 ENCOUNTER — Encounter: Payer: Self-pay | Admitting: Cardiovascular Disease

## 2012-01-20 ENCOUNTER — Encounter (HOSPITAL_COMMUNITY): Payer: Self-pay | Admitting: Pharmacy Technician

## 2012-01-20 ENCOUNTER — Encounter: Payer: Self-pay | Admitting: *Deleted

## 2012-01-20 ENCOUNTER — Ambulatory Visit (INDEPENDENT_AMBULATORY_CARE_PROVIDER_SITE_OTHER): Payer: Medicare Other | Admitting: Cardiovascular Disease

## 2012-01-20 VITALS — BP 150/70 | HR 74 | Ht 70.0 in | Wt 251.0 lb

## 2012-01-20 DIAGNOSIS — I200 Unstable angina: Secondary | ICD-10-CM

## 2012-01-20 DIAGNOSIS — I2510 Atherosclerotic heart disease of native coronary artery without angina pectoris: Secondary | ICD-10-CM

## 2012-01-20 LAB — CBC WITH DIFFERENTIAL/PLATELET
Basophils Absolute: 0 10*3/uL (ref 0.0–0.1)
Basophils Relative: 0.3 % (ref 0.0–3.0)
Eosinophils Absolute: 0.2 10*3/uL (ref 0.0–0.7)
Eosinophils Relative: 2.5 % (ref 0.0–5.0)
HCT: 37.2 % — ABNORMAL LOW (ref 39.0–52.0)
Hemoglobin: 12.3 g/dL — ABNORMAL LOW (ref 13.0–17.0)
Lymphocytes Relative: 33.3 % (ref 12.0–46.0)
Lymphs Abs: 2.3 10*3/uL (ref 0.7–4.0)
MCHC: 33 g/dL (ref 30.0–36.0)
MCV: 92 fl (ref 78.0–100.0)
Monocytes Absolute: 0.6 10*3/uL (ref 0.1–1.0)
Monocytes Relative: 8.2 % (ref 3.0–12.0)
Neutro Abs: 3.9 10*3/uL (ref 1.4–7.7)
Neutrophils Relative %: 55.7 % (ref 43.0–77.0)
Platelets: 158 10*3/uL (ref 150.0–400.0)
RBC: 4.05 Mil/uL — ABNORMAL LOW (ref 4.22–5.81)
RDW: 16.2 % — ABNORMAL HIGH (ref 11.5–14.6)
WBC: 7 10*3/uL (ref 4.5–10.5)

## 2012-01-20 LAB — BASIC METABOLIC PANEL
BUN: 13 mg/dL (ref 6–23)
CO2: 27 mEq/L (ref 19–32)
Calcium: 9 mg/dL (ref 8.4–10.5)
Chloride: 104 mEq/L (ref 96–112)
Creatinine, Ser: 0.9 mg/dL (ref 0.4–1.5)
GFR: 87.55 mL/min (ref >60.00)
Glucose, Bld: 174 mg/dL — ABNORMAL HIGH (ref 70–99)
Potassium: 3.8 mEq/L (ref 3.5–5.1)
Sodium: 138 mEq/L (ref 135–145)

## 2012-01-20 LAB — SYNOVIAL FLUID, CRYSTAL: Crystals, Fluid: NONE SEEN

## 2012-01-20 LAB — PROTIME-INR
INR: 1.1 ratio — ABNORMAL HIGH (ref 0.8–1.0)
Prothrombin Time: 11.2 s (ref 10.2–12.4)

## 2012-01-20 NOTE — Progress Notes (Signed)
Quick Note:  CALLED PT CELL LEFT MESSAGE URIC ACID LEVEL IN FLUID LOOKS GOOD CONTINUE PRESENT MEDS ______

## 2012-01-20 NOTE — Patient Instructions (Addendum)
Your physician recommends that you schedule a follow-up appointment in:  4 weeks.   Your physician has requested that you have a cardiac catheterization. Cardiac catheterization is used to diagnose and/or treat various heart conditions. Doctors may recommend this procedure for a number of different reasons. The most common reason is to evaluate chest pain. Chest pain can be a symptom of coronary artery disease (CAD), and cardiac catheterization can show whether plaque is narrowing or blocking your heart's arteries. This procedure is also used to evaluate the valves, as well as measure the blood flow and oxygen levels in different parts of your heart. For further information please visit [email or web address]. Please follow instruction sheet, as given. Scheduled for January 21, 2012   Coronary Angiography Coronary angiography is an X-ray procedure used to look at the arteries in the heart. In this procedure, a dye is injected through a long, hollow tube (catheter). The catheter is about the size of a piece of cooked spaghetti. The catheter injects a dye into an artery in your groin. X-rays are then taken to show if there is a blockage in the arteries of your heart. BEFORE THE PROCEDURE   Let your caregiver know if you have allergies to shellfish or contrast dye. Also let your caregiver know if you have kidney problems or failure.  Do not eat or drink starting from midnight up to the time of the procedure, or as directed.  You may drink enough water to take your medications the morning of the procedure if you were instructed to do so.  You should be at the hospital or outpatient facility where the procedure is to be done 60 minutes prior to the procedure or as directed. PROCEDURE  You may be given an IV medication to help you relax before the procedure.  You will be prepared for the procedure by washing and shaving the area where the catheter will be inserted. This is usually done in the groin but  may be done in the fold of your arm by your elbow.  A medicine will be given to numb your groin where the catheter will be inserted.  A specially trained doctor will insert the catheter into an artery in your groin. The catheter is guided by using a special type of X-ray (fluoroscopy) to the blood vessel being examined.  A special dye is then injected into the catheter and X-rays are taken. The dye helps to show where any narrowing or blockages are located in the heart arteries. AFTER THE PROCEDURE   After the procedure you will be kept in bed lying flat for several hours. You will be instructed to not bend or cross your legs.  The groin insertion site will be watched and checked frequently.  The pulse in your feet will be checked frequently.  Additional blood tests, X-rays and an EKG may be done.  You may stay in the hospital overnight for observation. SEEK IMMEDIATE MEDICAL CARE IF:   You develop chest pain, shortness of breath, feel faint, or pass out.  There is bleeding, swelling, or drainage from the catheter insertion site.  You develop pain, discoloration, coldness, or severe bruising in the leg or area where the catheter was inserted.  You have a fever. Document Released: 07/25/2002 Document Revised: 04/12/2011 Document Reviewed: 09/13/2007 [facility] Patient Information 2013 [place].

## 2012-01-20 NOTE — Progress Notes (Signed)
History of Present Illness: 71 yo WM with history of CAD with stent RCA, DM here today for cardiology follow up. I last saw him in July 2013. He was seen in our office 09/07/11 by Richardson Dopp, PA-C for pre-op evaluation t 2011.His echo in 2009 showed mild LVH and normal LV function with no significant valvular abnormalities. His cath iin 2009 showed a near total occlusion of the RCA beyond the stent in the mid portion. I attempted PCI and was able to balloon the mid lesion but could never pass a balloon distally. He did well after that. He lives in the [place]. 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   He returns today for follow up.  He tells me that he has been having chest tightness with minimal exertion associated with SOB. He has noticed his heart pounding but it has not been racing. He has been taking all of his medications.   Primary Care Physician: Jill Alexanders  Last Lipid Profile:Lipid Panel     Component Value Date/Time   CHOL 134 09/06/2011 1107   TRIG 132 09/06/2011 1107   HDL 33* 09/06/2011 1107   CHOLHDL 4.1 09/06/2011 1107   VLDL 26 09/06/2011 1107   [place] 75 09/06/2011 1107     Past Medical History  Diagnosis Date  . Diabetes mellitus     with neuropathy  . Hypertension   . AAA (abdominal aortic aneurysm)   . Gout   . Vitamin B12 deficiency   . Hiatal hernia   . CAD (coronary artery disease)     with cath 09/26/08 showing severe heart disease in RCA with POBA, mild to mod diesease in circ and LAD  . Chronic back pain     back problmes. prior to surgery     Past Surgical History  Procedure Date  . Back surgery     x3  . Left ankle surgery   . Carpal tunnel release     L arm   . 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 allopurinol (ZYLOPRIM) 300 MG tablet Take 1 tablet (300 mg total) by mouth daily.  90  tablet  3  . aspirin 81 MG tablet Take 81 mg by mouth daily.        . clopidogrel (PLAVIX) 75 MG tablet Take 1 tablet (75 mg total) by mouth daily.  90 tablet  4  . colchicine 0.6 MG tablet Take 1 tablet (0.6 mg total) by mouth 2 (two) times daily.  180 tablet  0  . fish oil-omega-3 fatty acids 1000 MG capsule Take 2 g by mouth daily.        Marland Kitchen glucose blood test strip Pt is to test 1 to 2 times daily use as directed  300 each  4  . isosorbide mononitrate (IMDUR) 30 MG 24 hr tablet Take 1 tablet (30 mg total) by mouth daily.  90 tablet  4  . metFORMIN (GLUCOPHAGE-XR) 500 MG 24 hr tablet Take 1 tablet (500 mg total) by mouth daily with breakfast.  180 tablet  1  . metoprolol tartrate (LOPRESSOR) 25 MG tablet Take 1 tablet (25 mg total) by mouth 2 (two) times daily.  180 tablet  3  . nitroGLYCERIN (NITROSTAT) 0.4 MG SL tablet Place 1 tablet (0.4 mg total) under the tongue every 5 (five)  minutes as needed.  90 tablet  1  . quinapril (ACCUPRIL) 20 MG tablet Take 1 tablet (20 mg total) by mouth at bedtime.  90 tablet  4  . simvastatin (ZOCOR) 80 MG tablet Take 1 tablet (80 mg total) by mouth at bedtime.  90 tablet  4    No Known Allergies  History   Social History  . Marital Status: Single    Spouse Name: N/A    Number of Children: N/A  . Years of Education: N/A   Occupational History  . Not on file.   Social History Main Topics  . Smoking status: Current Some Day Smoker    Types: Cigars  . Smokeless tobacco: Never Used     Comment: tobacco use - no  . Alcohol Use: 0.6 oz/week    1 Glasses of wine per week  . Drug Use: No  . Sexually Active: Yes   Other Topics Concern  . Not on file   Social History Narrative   Divorced, 1 child. 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  Review of Systems:  As stated in the HPI and otherwise negative.   BP 150/70  Pulse 74  Ht 5\' 10"  (1.778 m)  Wt 251 lb  (113.853 kg)  BMI 36.01 kg/m2  Physical Examination: General: Well developed, well nourished, NAD HEENT: OP clear, mucus membranes moist SKIN: warm, dry. No rashes. Neuro: No focal deficits Musculoskeletal: Muscle strength 5/5 all ext Psychiatric: Mood and affect normal Neck: No JVD, no carotid bruits, no thyromegaly, no lymphadenopathy. Lungs:Clear bilaterally, no wheezes, rhonci, crackles Cardiovascular: Regular rate and rhythm. No murmurs, gallops or rubs. Abdomen:Soft. Bowel sounds present. Non-tender.  Extremities: No lower extremity edema. Pulses are 2 + in the bilateral DP/PT.  EKG: NSR, rate 67 bpm. T wave inversion inferior leads.   Cardiac cath 09/26/08: 1. The left main coronary artery had diffuse 20-30% disease through  the proximal and midportions and a 40% stenosis in the distal  portion.  2. The left anterior descending is a large vessel that courses at the  apex and gives off several diagonal branches. There appears to be  a 30% ostial stenosis and a 40% stenosis in the midportion of the  vessel at the take off of a moderate-sized second diagonal branch.  First diagonal is small in caliber. Second diagonal is moderate  size and has an ostial 40% stenosis.  3. The circumflex artery has a mid 40% stenosis and then has a  moderate-sized bifurcating obtuse marginal branch that has a 40%  stenosis.  4. The right coronary artery is a dominant vessel that has a stented  proximal portion that appears to have moderate 40% in-stent  restenosis. Beyond the stented segment in the mid vessel there is  a 99% subtotal occlusion. There is TIMI-1 flow into the distal  right coronary artery. PCI RCA with balloon angioplasty only.    Assessment and Plan:   1. CAD: He is known to have moderate disease in the LAD and Circumflex with prior stent in the RCA with severe disease in the distal RCA that was treated in 2010 with balloon angioplasty only as we could not deliver equipment  into the distal vessel. With his presentation, likely progression of CAD. Symptoms c/w unstable angina. Will arrange left heart cath tomorrow in main lab. R/B reviewed. Will check pre-cath labs today.   2. HTN: BP has  been well controlled at home. No changes today.

## 2012-01-21 ENCOUNTER — Ambulatory Visit (HOSPITAL_COMMUNITY)
Admission: RE | Admit: 2012-01-21 | Discharge: 2012-01-21 | Disposition: A | Discharge status: Home / Self Care | Payer: Medicare Other | Source: Ambulatory Visit | Attending: Cardiovascular Disease | Admitting: Cardiovascular Disease

## 2012-01-21 ENCOUNTER — Encounter (HOSPITAL_COMMUNITY)
Admission: RE | Disposition: A | Discharge status: Home / Self Care | Payer: Self-pay | Source: Ambulatory Visit | Attending: Cardiovascular Disease

## 2012-01-21 DIAGNOSIS — Y831 Surgical operation with implant of artificial internal device as the cause of abnormal reaction of the patient, or of later complication, without mention of misadventure at the time of the procedure: Secondary | ICD-10-CM | POA: Insufficient documentation

## 2012-01-21 DIAGNOSIS — R0602 Shortness of breath: Secondary | ICD-10-CM | POA: Insufficient documentation

## 2012-01-21 DIAGNOSIS — I200 Unstable angina: Secondary | ICD-10-CM

## 2012-01-21 DIAGNOSIS — M109 Gout, unspecified: Secondary | ICD-10-CM

## 2012-01-21 DIAGNOSIS — T82897A Other specified complication of cardiac prosthetic devices, implants and grafts, initial encounter: Secondary | ICD-10-CM | POA: Insufficient documentation

## 2012-01-21 DIAGNOSIS — I2510 Atherosclerotic heart disease of native coronary artery without angina pectoris: Secondary | ICD-10-CM | POA: Insufficient documentation

## 2012-01-21 DIAGNOSIS — I2582 Chronic total occlusion of coronary artery: Secondary | ICD-10-CM | POA: Insufficient documentation

## 2012-01-21 DIAGNOSIS — R0789 Other chest pain: Secondary | ICD-10-CM | POA: Insufficient documentation

## 2012-01-21 DIAGNOSIS — E119 Type 2 diabetes mellitus without complications: Secondary | ICD-10-CM | POA: Insufficient documentation

## 2012-01-21 DIAGNOSIS — Z9861 Coronary angioplasty status: Secondary | ICD-10-CM | POA: Insufficient documentation

## 2012-01-21 HISTORY — PX: LEFT HEART CATHETERIZATION WITH CORONARY ANGIOGRAM: SHX5451

## 2012-01-21 LAB — GLUCOSE, CAPILLARY
Glucose-Capillary: 124 mg/dL — ABNORMAL HIGH (ref 70–99)
Glucose-Capillary: 72 mg/dL (ref 70–99)

## 2012-01-21 SURGERY — LEFT HEART CATHETERIZATION WITH CORONARY ANGIOGRAM
Anesthesia: LOCAL

## 2012-01-21 MED ORDER — ASPIRIN 81 MG PO CHEW
CHEWABLE_TABLET | ORAL | Status: AC
  Administered 2012-01-21: 324 mg via ORAL
  Filled 2012-01-21: qty 4

## 2012-01-21 MED ORDER — DIAZEPAM 5 MG PO TABS
5.0000 mg | ORAL_TABLET | ORAL | Status: AC
  Administered 2012-01-21: 5 mg via ORAL

## 2012-01-21 MED ORDER — DIAZEPAM 5 MG PO TABS
ORAL_TABLET | ORAL | Status: AC
  Administered 2012-01-21: 5 mg via ORAL
  Filled 2012-01-21: qty 1

## 2012-01-21 MED ORDER — SODIUM CHLORIDE 0.9 % IV SOLN
250.0000 mL | INTRAVENOUS | Status: DC | PRN
Start: 2012-01-21 — End: 2012-01-21

## 2012-01-21 MED ORDER — ACETAMINOPHEN 325 MG PO TABS: 650.0000 mg | ORAL_TABLET | ORAL | Status: DC | PRN

## 2012-01-21 MED ORDER — FENTANYL CITRATE 0.05 MG/ML IJ SOLN
INTRAMUSCULAR | Status: AC
  Filled 2012-01-21: qty 2

## 2012-01-21 MED ORDER — ASPIRIN 81 MG PO CHEW
324.0000 mg | CHEWABLE_TABLET | ORAL | Status: AC
  Administered 2012-01-21: 324 mg via ORAL

## 2012-01-21 MED ORDER — SODIUM CHLORIDE 0.9 % IV SOLN
INTRAVENOUS | Status: DC
  Administered 2012-01-21 (×2): via INTRAVENOUS

## 2012-01-21 MED ORDER — SODIUM CHLORIDE 0.9 % IV SOLN: INTRAVENOUS | Status: DC

## 2012-01-21 MED ORDER — HEPARIN (PORCINE) IN NACL 2-0.9 UNIT/ML-% IJ SOLN
INTRAMUSCULAR | Status: AC
  Filled 2012-01-21: qty 1000

## 2012-01-21 MED ORDER — ONDANSETRON HCL 4 MG/2ML IJ SOLN: 4.0000 mg | Freq: Four times a day (QID) | INTRAMUSCULAR | Status: DC | PRN

## 2012-01-21 MED ORDER — LIDOCAINE HCL (PF) 1 % IJ SOLN
INTRAMUSCULAR | Status: AC
  Filled 2012-01-21: qty 30

## 2012-01-21 MED ORDER — MIDAZOLAM HCL 2 MG/2ML IJ SOLN
INTRAMUSCULAR | Status: AC
  Filled 2012-01-21: qty 4

## 2012-01-21 MED ORDER — SODIUM CHLORIDE 0.9 % IJ SOLN: 3.0000 mL | Freq: Two times a day (BID) | INTRAMUSCULAR | Status: DC

## 2012-01-21 MED ORDER — HEPARIN SODIUM (PORCINE) 1000 UNIT/ML IJ SOLN
INTRAMUSCULAR | Status: AC
  Filled 2012-01-21: qty 1

## 2012-01-21 MED ORDER — VERAPAMIL HCL 2.5 MG/ML IV SOLN
INTRAVENOUS | Status: AC
  Filled 2012-01-21: qty 2

## 2012-01-21 MED ORDER — SODIUM CHLORIDE 0.9 % IJ SOLN: 3.0000 mL | INTRAMUSCULAR | Status: DC | PRN

## 2012-01-21 NOTE — Interval H&P Note (Signed)
History and Physical Interval Note:  01/21/2012 1:42 PM  Murphy Marg  has presented today for cardiac cath with the diagnosis of chest pain/CAD. The various methods of treatment have been discussed with the patient and family. After consideration of risks, benefits and other options for treatment, the patient has consented to  Procedure(s) (LRB) with comments: LEFT HEART CATHETERIZATION WITH CORONARY ANGIOGRAM (N/A) as a surgical intervention .  The patient's history has been reviewed, patient examined, no change in status, stable for surgery.  I have reviewed the patient's chart and labs.  Questions were answered to the patient's satisfaction.     MCALHANY,CHRISTOPHER

## 2012-01-21 NOTE — CV Procedure (Signed)
Cardiac Catheterization Operative Report  Paul [name] HC:[id number] 12/20/20132:08 PM Wyatt Haste, MD  Procedure Performed:  1. Left Heart Catheterization 2. Selective Coronary Angiography 3. Left ventricular angiogram  Operator: Lauree Chandler, MD  Arterial access site:  Right radial artery.   Indication:  71 yo WM with history of CAD with stent RCA, DM here today for cardiac cath with recent c/o chest tightness with SOB, mostly with exertion. His cath iin 2009 showed a near total occlusion of the RCA beyond the stent in the mid portion. I attempted PCI and was able to balloon the mid lesion but could never pass a balloon distally. He did well after that until recently.                                Procedure Details: The risks, benefits, complications, treatment options, and expected outcomes were discussed with the patient. The patient and/or family concurred with the proposed plan, giving informed consent. The patient was brought to the cath lab after IV hydration was begun and oral premedication was given. The patient was further sedated with Versed and Fentanyl. The right wrist was assessed with an Allens test which was positive. The right wrist was prepped and draped in a sterile fashion. 1% lidocaine was used for local anesthesia. Using the modified Seldinger access technique, a 5 French sheath was placed in the right radial artery. 3 mg Verapamil was given through the sheath. 5000 units IV heparin was given. Standard diagnostic catheters were used to perform selective coronary angiography. A pigtail catheter was used to perform a left ventricular angiogram. The sheath was removed from the right radial artery and a Terumo hemostasis band was applied at the arteriotomy site on the right wrist.   There were no immediate complications. The patient was taken to the recovery area in stable condition.   Hemodynamic Findings: Central aortic pressure: 136/63 Left  ventricular pressure: 140/10/18  Angiographic Findings:  1. Left main:  The left main coronary artery had diffuse 20-30% disease through the proximal and midportions and a 40% stenosis in the distal portion.   2. Left anterior descending: The left anterior descending is a large vessel that courses to the apex and gives off several diagonal branches. There appears to be a 50% ostial stenosis and serial  40% stenoses in the midportion of the vessel. First diagonal is small in caliber. Second diagonal is moderate size and has an ostial 40% stenosis and 50% mid stenosis.   3. Circumflex artery:  The circumflex artery has a mid 40% stenosis and then gives off a moderate-sized bifurcating obtuse marginal branch that has a serial 50% stenoses. None of these appear to be flow limiting.   4. Right coronary artery: Moderate sized, dominant vessel that has a stented segment in the proximal portion that appears to have moderate 40% in-stent restenosis. Beyond the stented segment in the mid vessel there is a 70 stenosis followed by 100% total occlusion of the vessel. There is a small caliber RV marginal branch that arises beyond the severe stenosis in the mid vessel.  5. Left Ventricular Angiogram: LVEF 55-60%  Impression: 1. Triple vessel CAD with diffuse non-obstructive plaque in the LAD and Circumflex with chronic total occlusion of mid RCA 2. Preserved LV systolic function   Recommendations: Will continue medical management. He will double his Imdur.        Complications:  None. The patient tolerated the  procedure well.

## 2012-01-21 NOTE — H&P (View-Only) (Signed)
History of Present Illness: 71 yo WM with history of CAD with stent RCA, DM here today for cardiology follow up. I last saw him in July 2013. He was seen in our office 09/07/11 by Richardson Dopp, PA-C for pre-op evaluation t 2011.His echo in 2009 showed mild LVH and normal LV function with no significant valvular abnormalities. His cath iin 2009 showed a near total occlusion of the RCA beyond the stent in the mid portion. I attempted PCI and was able to balloon the mid lesion but could never pass a balloon distally. He did well after that. He lives in the [place]. 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   He returns today for follow up.  He tells me that he has been having chest tightness with minimal exertion associated with SOB. He has noticed his heart pounding but it has not been racing. He has been taking all of his medications.   Primary Care Physician: Jill Alexanders  Last Lipid Profile:Lipid Panel     Component Value Date/Time   CHOL 134 09/06/2011 1107   TRIG 132 09/06/2011 1107   HDL 33* 09/06/2011 1107   CHOLHDL 4.1 09/06/2011 1107   VLDL 26 09/06/2011 1107   [place] 75 09/06/2011 1107     Past Medical History  Diagnosis Date  . Diabetes mellitus     with neuropathy  . Hypertension   . AAA (abdominal aortic aneurysm)   . Gout   . Vitamin B12 deficiency   . Hiatal hernia   . CAD (coronary artery disease)     with cath 09/26/08 showing severe heart disease in RCA with POBA, mild to mod diesease in circ and LAD  . Chronic back pain     back problmes. prior to surgery     Past Surgical History  Procedure Date  . Back surgery     x3  . Left ankle surgery   . Carpal tunnel release     L arm   . 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 allopurinol (ZYLOPRIM) 300 MG tablet Take 1 tablet (300 mg total) by mouth daily.  90  tablet  3  . aspirin 81 MG tablet Take 81 mg by mouth daily.        . clopidogrel (PLAVIX) 75 MG tablet Take 1 tablet (75 mg total) by mouth daily.  90 tablet  4  . colchicine 0.6 MG tablet Take 1 tablet (0.6 mg total) by mouth 2 (two) times daily.  180 tablet  0  . fish oil-omega-3 fatty acids 1000 MG capsule Take 2 g by mouth daily.        Marland Kitchen glucose blood test strip Pt is to test 1 to 2 times daily use as directed  300 each  4  . isosorbide mononitrate (IMDUR) 30 MG 24 hr tablet Take 1 tablet (30 mg total) by mouth daily.  90 tablet  4  . metFORMIN (GLUCOPHAGE-XR) 500 MG 24 hr tablet Take 1 tablet (500 mg total) by mouth daily with breakfast.  180 tablet  1  . metoprolol tartrate (LOPRESSOR) 25 MG tablet Take 1 tablet (25 mg total) by mouth 2 (two) times daily.  180 tablet  3  . nitroGLYCERIN (NITROSTAT) 0.4 MG SL tablet Place 1 tablet (0.4 mg total) under the tongue every 5 (five)  minutes as needed.  90 tablet  1  . quinapril (ACCUPRIL) 20 MG tablet Take 1 tablet (20 mg total) by mouth at bedtime.  90 tablet  4  . simvastatin (ZOCOR) 80 MG tablet Take 1 tablet (80 mg total) by mouth at bedtime.  90 tablet  4    No Known Allergies  History   Social History  . Marital Status: Single    Spouse Name: N/A    Number of Children: N/A  . Years of Education: N/A   Occupational History  . Not on file.   Social History Main Topics  . Smoking status: Current Some Day Smoker    Types: Cigars  . Smokeless tobacco: Never Used     Comment: tobacco use - no  . Alcohol Use: 0.6 oz/week    1 Glasses of wine per week  . Drug Use: No  . Sexually Active: Yes   Other Topics Concern  . Not on file   Social History Narrative   Divorced, 1 child. 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  Review of Systems:  As stated in the HPI and otherwise negative.   BP 150/70  Pulse 74  Ht 5\' 10"  (1.778 m)  Wt 251 lb  (113.853 kg)  BMI 36.01 kg/m2  Physical Examination: General: Well developed, well nourished, NAD HEENT: OP clear, mucus membranes moist SKIN: warm, dry. No rashes. Neuro: No focal deficits Musculoskeletal: Muscle strength 5/5 all ext Psychiatric: Mood and affect normal Neck: No JVD, no carotid bruits, no thyromegaly, no lymphadenopathy. Lungs:Clear bilaterally, no wheezes, rhonci, crackles Cardiovascular: Regular rate and rhythm. No murmurs, gallops or rubs. Abdomen:Soft. Bowel sounds present. Non-tender.  Extremities: No lower extremity edema. Pulses are 2 + in the bilateral DP/PT.  EKG: NSR, rate 67 bpm. T wave inversion inferior leads.   Cardiac cath 09/26/08: 1. The left main coronary artery had diffuse 20-30% disease through  the proximal and midportions and a 40% stenosis in the distal  portion.  2. The left anterior descending is a large vessel that courses at the  apex and gives off several diagonal branches. There appears to be  a 30% ostial stenosis and a 40% stenosis in the midportion of the  vessel at the take off of a moderate-sized second diagonal branch.  First diagonal is small in caliber. Second diagonal is moderate  size and has an ostial 40% stenosis.  3. The circumflex artery has a mid 40% stenosis and then has a  moderate-sized bifurcating obtuse marginal branch that has a 40%  stenosis.  4. The right coronary artery is a dominant vessel that has a stented  proximal portion that appears to have moderate 40% in-stent  restenosis. Beyond the stented segment in the mid vessel there is  a 99% subtotal occlusion. There is TIMI-1 flow into the distal  right coronary artery. PCI RCA with balloon angioplasty only.    Assessment and Plan:   1. CAD: He is known to have moderate disease in the LAD and Circumflex with prior stent in the RCA with severe disease in the distal RCA that was treated in 2010 with balloon angioplasty only as we could not deliver equipment  into the distal vessel. With his presentation, likely progression of CAD. Symptoms c/w unstable angina. Will arrange left heart cath tomorrow in main lab. R/B reviewed. Will check pre-cath labs today.   2. HTN: BP has  been well controlled at home. No changes today.

## 2012-01-31 ENCOUNTER — Telehealth: Payer: Self-pay | Admitting: Family Medicine

## 2012-01-31 NOTE — Telephone Encounter (Signed)
LM

## 2012-02-04 ENCOUNTER — Telehealth: Payer: Self-pay | Admitting: Internal Medicine

## 2012-02-04 MED ORDER — ACCU-CHEK AVIVA PLUS W/DEVICE KIT: 1.0000 | PACK | Freq: Every day | Status: DC

## 2012-02-04 MED ORDER — GLUCOSE BLOOD VI STRP: ORAL_STRIP | Status: DC

## 2012-02-04 MED ORDER — LANCETS MISC: Status: DC

## 2012-02-04 NOTE — Telephone Encounter (Signed)
SENT IN NEW METER AND STRIPS AND LANCETS

## 2012-02-04 NOTE — Telephone Encounter (Signed)
pt called stating [facility] no longer carrys one touch ultra and pt needs a new meter with test strips. [facility] has accuchek nano or accuchek aciva. Pt would need meter along with the test strips to [facility]

## 2012-02-09 ENCOUNTER — Encounter: Payer: Medicare Other | Attending: Family Medicine | Admitting: Dietician

## 2012-02-09 ENCOUNTER — Encounter: Payer: Self-pay | Admitting: Dietician

## 2012-02-09 VITALS — Ht 70.0 in | Wt 231.6 lb

## 2012-02-09 DIAGNOSIS — Z713 Dietary counseling and surveillance: Secondary | ICD-10-CM | POA: Insufficient documentation

## 2012-02-09 DIAGNOSIS — E119 Type 2 diabetes mellitus without complications: Secondary | ICD-10-CM | POA: Insufficient documentation

## 2012-02-09 NOTE — Patient Instructions (Addendum)
   Continue to limit the "white foods" that tend to be higher in the carbohydrate.  Continue to use the low carb, low sugar juices.  Continue with the simpler meals.  Continue to monitor portions.   Try to aim for 3-4 carbohydrate servings for meals.  Continue to walk regularly.  Continue to listen to [place].  Eat half of what you think you want.

## 2012-02-09 NOTE — Progress Notes (Signed)
. Medical Nutrition Therapy:  Appt start time: 1050 end time:  1200.   Assessment:  Primary concerns today: Comes today noting that his MD wanted him to see me.  He is unsure as to why the MD wants him to see me.  Gives a history of type 2 diabetes for "about 20 years."  Has lost about 50 lb in the last 10 years.  Currently at 231.6 lb.  Current A1C at 6.8%. Returned to the [place] to live in the last year.  Is planning to bring his girl friend from the [place] to the [place] and to be married in April or Felisha Claytor of this year.  Has purchased a home and is about getting ready to do some restoration and purchasing of furniture.    BLOOD GLUCOSE:  Monitoring fasting glucose 2-3 times per week.  Always less than 120 mg.  Usually in the 114-118-120 range.  HYPOGLYCEMIA: Denies any S/S of low blood glucose.  HYPERGLYCEMIA:  Denies any S/S of hight blood glucose.     MEDICATIONS: Completed medication review.  Current diabetes medication is the Metformin ER 500 mg daily.   DIETARY INTAKE:  Usual eating pattern includes 3 meals and 1-2 snacks per day.  24-hr recall:  B ( AM): 4:30-5:00 glass of V8 juice  4 oz. Then takes meds and then has a banana.  Snk ( AM): 9:00 breakfast: sausage biscuit or piece of toast with jelly (SF) or occasionally a bowl of cereal. Once a week a breakfast of eggs, sausage and toast (usually whole wheat)  L ( PM): 12:00-1:00 PMSnack of a half of sandwich.  Uses the wheat bread.  Tries to avoid white foods.  Drinks water.   Snk ( PM): Jackelyn Illingworth pick up a cookie or a pack of the Lance crackers 1-2 times per week. D ( PM): 4:00 PM Kamren Heskett skip evening meal on rare occassions.  OnFriday nights will have fish (senior white fish plate) with the fries and coleslaw and a couple hush puppies. Tries to limit the fries. OR  Amulya Quintin have 2 hot dogs with buns, mustard chillie and onions and water.  OR the fish sandwich and 1/2 order of the french friew.  Janine Limbo had 2 berrito supreme's with water to drink.    Snk ( PM): usually none Beverages: water, V8 juice.  Usual physical activity: Currently has no planned exercise.  During the warmer months, did a great deal of work on his sisters home and garage, along with building her a shed and outbuilding.     Estimated energy needs: HT: 70 in  WT: 231.6 lb  BMI: 33.3 kg/m2  Adj WT:  191 lb  (87 kg) 1700-1800 calories 195 g carbohydrates 130 g protein 46-49 g fat  Progress Towards Goal(s):  In progress.   Nutritional Diagnosis:  [place]-2.1 Inpaired nutrition utilization As related to blood glucose.  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    Intervention:  Nutrition Review of the diet reveals that while he avoids "white foods" he is not consistent with limiting his white starches and carbs. Advised to use the whole wheat/whole grain products and to limit his intake of starch to 3-4 servings per meal.  Use the food label for portion sizes.  Try to have fiber in all breads, cereals, pasta and rice.  Plan to keep the sugar in products to (0-9 gm)  per serving.  Use the web site calorieking.com for carb content and calories.  Try to pay more attention to portions and watch for the carb that is "sneaking into his diet".  Keep the meats lean and backed, grilled, or roasted.  Limit fats to 2-3 added servings per meal.   Plan to try to walk 3-4 times each week.  While in the big box store, plan to walk and not shop.  Keep a steady pace.  Look to use exercise to help with getting the A1C even lower and to assist with some weight loss.  Consider getting weight down to about 200 lbs.  Handouts given during visit include:  List of non-starchy veggies.  Yellow card with the Carb Exchange serving sizes.  Novo Nordisk "diabetes and you"  Food label with review of components and recommendations  Monitoring/Evaluation:  Dietary intake, exercise, blood glucose  levels, and body weight in the next few months if the A1C is not decreasing and he is not succesful with losing some weight .

## 2012-02-11 LAB — HM DIABETES EYE EXAM

## 2012-02-14 ENCOUNTER — Encounter: Payer: Self-pay | Admitting: Internal Medicine

## 2012-02-15 ENCOUNTER — Ambulatory Visit (INDEPENDENT_AMBULATORY_CARE_PROVIDER_SITE_OTHER): Payer: Medicare Other | Admitting: Cardiovascular Disease

## 2012-02-15 ENCOUNTER — Encounter: Payer: Self-pay | Admitting: Cardiovascular Disease

## 2012-02-15 VITALS — BP 150/76 | HR 87 | Ht 70.0 in | Wt 228.0 lb

## 2012-02-15 DIAGNOSIS — I2510 Atherosclerotic heart disease of native coronary artery without angina pectoris: Secondary | ICD-10-CM

## 2012-02-15 DIAGNOSIS — I10 Essential (primary) hypertension: Secondary | ICD-10-CM

## 2012-02-15 DIAGNOSIS — R0989 Other specified symptoms and signs involving the circulatory and respiratory systems: Secondary | ICD-10-CM

## 2012-02-15 MED ORDER — ISOSORBIDE MONONITRATE ER 30 MG PO TB24: 30.0000 mg | ORAL_TABLET | Freq: Two times a day (BID) | ORAL | Status: DC

## 2012-02-15 NOTE — Progress Notes (Signed)
History of Present Illness: 72 yo WM with history of CAD with stent RCA, DM here today for cardiology follow up. I last saw him in July 2013. He was seen in our office 09/07/11 by Richardson Dopp, PA-C for pre-op evaluation t 2011.His echo in 2009 showed mild LVH and normal LV function with no significant valvular abnormalities. His cath iin 2009 showed a near total occlusion of the RCA beyond the stent in the mid portion. I attempted PCI and was able to balloon the mid lesion but could never pass a balloon distally. He did well after that. 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 I saw him December 2013 and he c/o chest tightness with minimal exertion associated with SOB. He has noticed his heart pounding but it has not been racing. I arranged a left heart cath on 01/21/12. He was found to have a total occlusion of the RCA which was not amenable to to PCI (PCI attempted in 2009). The LAD and Circumflex had moderate disease. I doubled his Imdur to 30 mg po BID.   He is here today for f/u. Doing well. Chest pain better with increased dose of Imdur. No SOB.   Primary Care Physician: Jill Alexanders   Last Lipid Profile:Lipid Panel     Component Value Date/Time   CHOL 134 09/06/2011 1107   TRIG 132 09/06/2011 1107   HDL 33* 09/06/2011 1107   CHOLHDL 4.1 09/06/2011 1107   VLDL 26 09/06/2011 1107   Paul [name] 75 09/06/2011 1107     Past Medical History  Diagnosis Date  . Diabetes mellitus     with neuropathy  . Hypertension   . AAA (abdominal aortic aneurysm)   . Gout   . Vitamin B12 deficiency   . Hiatal hernia   . CAD (coronary artery disease)     with cath 09/26/08 showing severe heart disease in RCA with POBA, mild to mod diesease in circ and LAD  . Chronic back pain     back problmes. prior to surgery     Past Surgical History  Procedure Date  . Back surgery     x3  . Left ankle surgery   . Carpal tunnel release     L arm   . 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 allopurinol (ZYLOPRIM) 300 MG tablet Take 1 tablet (300 mg total) by mouth daily.  90 tablet  3  . aspirin 81 MG chewable tablet Chew 81 mg by mouth daily.      . Blood Glucose Monitoring Suppl (ACCU-CHEK AVIVA PLUS) W/DEVICE KIT 1 kit by Does not apply route daily. USE AS DIRECTED  1 kit  0  . clopidogrel (PLAVIX) 75 MG tablet Take 75 mg by mouth daily.      . colchicine 0.6 MG tablet Take 0.6 mg by mouth 2 (two) times daily.      . fish oil-omega-3 fatty acids 1000 MG capsule Take 2 g by mouth daily.       Marland Kitchen glucose blood (ACCU-CHEK AVIVA PLUS) test strip PT IS TO TEST 1 TO 2 TIMES A DAY  300 each  PRN  . isosorbide mononitrate (IMDUR) 30 MG 24 hr tablet Take 30 mg by mouth daily.      . Lancets MISC PT IS TO TEST 1 TO 2 TIMES A DAY  300 each  PRN  . metFORMIN (GLUCOPHAGE-XR)  500 MG 24 hr tablet Take 500 mg by mouth daily with breakfast.      . metoprolol tartrate (LOPRESSOR) 25 MG tablet Take 25 mg by mouth 2 (two) times daily.      . nitroGLYCERIN (NITROSTAT) 0.4 MG SL tablet Place 0.4 mg under the tongue every 5 (five) minutes as needed. For chest pain      . quinapril (ACCUPRIL) 20 MG tablet Take 20 mg by mouth 2 (two) times daily.       . simvastatin (ZOCOR) 80 MG tablet Take 80 mg by mouth at bedtime.        No Known Allergies  History   Social History  . Marital Status: Single    Spouse Name: N/A    Number of Children: N/A  . Years of Education: N/A   Occupational History  . Not on file.   Social History Main Topics  . Smoking status: Current Some Day Smoker    Types: Cigars  . Smokeless tobacco: Never Used     Comment: tobacco use - no  . Alcohol Use: 0.6 oz/week    1 Glasses of wine per week  . Drug Use: No  . Sexually Active: Yes   Other Topics Concern  . Not on file   Social History Narrative   Divorced, 1 child. 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  Review of Systems:  As stated in the HPI and otherwise negative.   BP 150/76  Pulse 87  Ht 5\' 10"  (1.778 m)  Wt 228 lb (103.42 kg)  BMI 32.71 kg/m2  SpO2 97%  Physical Examination: General: Well developed, well nourished, NAD HEENT: OP clear, mucus membranes moist SKIN: warm, dry. No rashes. Neuro: No focal deficits Musculoskeletal: Muscle strength 5/5 all ext Psychiatric: Mood and affect normal Neck: No JVD, right carotid bruit, no thyromegaly, no lymphadenopathy. Lungs:Clear bilaterally, no wheezes, rhonci, crackles Cardiovascular: Regular rate and rhythm. No murmurs, gallops or rubs. Abdomen:Soft. Bowel sounds present. Non-tender.  Extremities: No lower extremity edema. Pulses are 2 + in the bilateral DP/PT.  Cardiac cath 01/21/12: 1. Left main: The left main coronary artery had diffuse 20-30% disease through the proximal and midportions and a 40% stenosis in the distal portion.  2. Left anterior descending: The left anterior descending is a large vessel that courses to the apex and gives off several diagonal branches. There appears to be a 50% ostial stenosis and serial 40% stenoses in the midportion of the vessel. First diagonal is small in caliber. Second diagonal is moderate size and has an ostial 40% stenosis and 50% mid stenosis.  3. Circumflex artery: The circumflex artery has a mid 40% stenosis and then gives off a moderate-sized bifurcating obtuse marginal branch that has a serial 50% stenoses. None of these appear to be flow limiting.  4. Right coronary artery: Moderate sized, dominant vessel that has a stented segment in the proximal portion that appears to have moderate 40% in-stent restenosis. Beyond the stented segment in the mid vessel there is a 70 stenosis followed by 100% total occlusion of the vessel. There is a small caliber RV marginal branch that arises beyond the severe stenosis in the mid vessel.  5. Left  Ventricular Angiogram: LVEF 55-60%  Impression:  1. Triple vessel CAD with diffuse non-obstructive plaque in the LAD and Circumflex with chronic total occlusion of mid RCA  2. Preserved LV systolic  function  Recommendations: Will continue medical management. He will double his Imdur.    Assessment and Plan:   1. CAD:  Stable angina.  Cardiac cath as above 01/21/12 with stable CAD. His RCA is occluded distally but not amenable to PCI. The LAD and Circumflex have moderate disease with no lesions that appear to be flow limiting. Imdur doubled and symptoms improved.   2. HTN: BP has been well controlled at home. No changes today.   3. Carotid bruit: Will arrange dopplers

## 2012-02-15 NOTE — Patient Instructions (Addendum)
Your physician wants you to follow-up in:  6 months. You will receive a reminder letter in the mail two months in advance. If you don't receive a letter, please call our office to schedule the follow-up appointment.  Your physician has requested that you have a carotid duplex. This test is an ultrasound of the carotid arteries in your neck. It looks at blood flow through these arteries that supply the brain with blood. Allow one hour for this exam. There are no restrictions or special instructions.

## 2012-02-21 ENCOUNTER — Encounter (INDEPENDENT_AMBULATORY_CARE_PROVIDER_SITE_OTHER): Payer: Medicare Other

## 2012-02-21 DIAGNOSIS — R0989 Other specified symptoms and signs involving the circulatory and respiratory systems: Secondary | ICD-10-CM

## 2012-02-21 DIAGNOSIS — I6529 Occlusion and stenosis of unspecified carotid artery: Secondary | ICD-10-CM

## 2012-02-24 ENCOUNTER — Telehealth: Payer: Self-pay | Admitting: Cardiovascular Disease

## 2012-02-24 NOTE — Telephone Encounter (Signed)
New problem:   Returning call back to  nurse.

## 2012-02-24 NOTE — Telephone Encounter (Signed)
Spoke with pt and reviewed carotid artery doppler results with him

## 2012-03-18 ENCOUNTER — Other Ambulatory Visit: Payer: Self-pay

## 2012-05-17 ENCOUNTER — Encounter: Payer: Self-pay | Admitting: Family Medicine

## 2012-05-17 ENCOUNTER — Ambulatory Visit (INDEPENDENT_AMBULATORY_CARE_PROVIDER_SITE_OTHER): Payer: Medicare Other | Admitting: Family Medicine

## 2012-05-17 VITALS — BP 122/78 | HR 64 | Wt 232.0 lb

## 2012-05-17 DIAGNOSIS — E785 Hyperlipidemia, unspecified: Secondary | ICD-10-CM

## 2012-05-17 DIAGNOSIS — E669 Obesity, unspecified: Secondary | ICD-10-CM

## 2012-05-17 DIAGNOSIS — I10 Essential (primary) hypertension: Secondary | ICD-10-CM

## 2012-05-17 DIAGNOSIS — IMO0001 Reserved for inherently not codable concepts without codable children: Secondary | ICD-10-CM

## 2012-05-17 LAB — POCT GLYCOSYLATED HEMOGLOBIN (HGB A1C): Hemoglobin A1C: 7

## 2012-05-17 NOTE — Patient Instructions (Signed)
Take 2 Prilosec in the evening for the next week or so and let me know what this does for the cough

## 2012-05-17 NOTE — Progress Notes (Signed)
  Subjective:    Patient ID: Paul [name], male    DOB: 02/18/1940, 72 y.o.   MRN: [id number]  HPI He complains of a six-month history of cough that mainly occurs at night and to a lesser extent in the morning. No symptoms of acid or indigestion. He continues to smoke. He continues on medications listed in the chart. He does intermittently check his blood sugars and they run in the low 100s. He has had an I. Exam. He does check his feet intermittently. Social history was reviewed in regard to cigarettes and alcohol.   Review of Systems     Objective:   Physical Exam Alert and in no distress. Cardiac exam shows regular rhythm without murmurs gallops. Lungs clear to auscultation. Hemoglobin A1c is 7.0       Assessment & Plan:  Type II or unspecified type diabetes mellitus without mention of complication, uncontrolled - Plan: HgB A1c  Obesity  HTN (hypertension)  Dyslipidemia I suspect his congestion and cough symptoms are reflux related. Recommend he try to Prilosec in the evening and let me know how it does. Also encouraged him to make further diet and exercise changes. Followup here in 4 months.

## 2012-05-18 ENCOUNTER — Ambulatory Visit: Payer: Medicare Other | Admitting: Family Medicine

## 2012-07-25 ENCOUNTER — Encounter: Payer: Self-pay | Admitting: Cardiovascular Disease

## 2012-07-25 ENCOUNTER — Ambulatory Visit (INDEPENDENT_AMBULATORY_CARE_PROVIDER_SITE_OTHER): Payer: Medicare Other | Admitting: Cardiovascular Disease

## 2012-07-25 VITALS — BP 122/60 | HR 66 | Ht 70.0 in | Wt 237.0 lb

## 2012-07-25 DIAGNOSIS — I2510 Atherosclerotic heart disease of native coronary artery without angina pectoris: Secondary | ICD-10-CM

## 2012-07-25 DIAGNOSIS — I779 Disorder of arteries and arterioles, unspecified: Secondary | ICD-10-CM

## 2012-07-25 DIAGNOSIS — I10 Essential (primary) hypertension: Secondary | ICD-10-CM

## 2012-07-25 MED ORDER — ISOSORBIDE MONONITRATE ER 30 MG PO TB24: 30.0000 mg | ORAL_TABLET | Freq: Two times a day (BID) | ORAL | Status: DC

## 2012-07-25 NOTE — Progress Notes (Signed)
History of Present Illness: 72 yo WM with history of CAD with stent RCA, DM here today for cardiology follow up. He was seen in our office 09/07/11 by Richardson Dopp, PA-C for pre-op evaluation. His echo in 2009 showed mild LVH and normal LV function with no significant valvular abnormalities. His cath iin 2009 showed a near total occlusion of the RCA beyond the stent in the mid portion. I attempted PCI and was able to balloon the mid lesion but could never pass a balloon distally. He did well after that. 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 I saw him December 2013 and he c/o chest tightness with minimal exertion associated with SOB. He has noticed his heart pounding but it has not been racing. I arranged a left heart cath on 01/21/12. He was found to have a total occlusion of the RCA which was not amenable to to PCI (PCI attempted in 2009). The LAD and Circumflex had moderate disease. I doubled his Imdur to 30 mg po BID.   He is here today for f/u. Doing well. No chest pain or SOB. He is active in the yard.   Primary Care Physician: Jill Alexanders   Last Lipid Profile:Lipid Panel     Component Value Date/Time   CHOL 134 09/06/2011 1107   TRIG 132 09/06/2011 1107   HDL 33* 09/06/2011 1107   CHOLHDL 4.1 09/06/2011 1107   VLDL 26 09/06/2011 1107   Hanson 75 09/06/2011 1107     Past Medical History  Diagnosis Date  . Diabetes mellitus     with neuropathy  . Hypertension   . AAA (abdominal aortic aneurysm)   . Gout   . Vitamin B12 deficiency   . Hiatal hernia   . CAD (coronary artery disease)     with cath 09/26/08 showing severe heart disease in RCA with POBA, mild to mod diesease in circ and LAD  . Chronic back pain     back problmes. prior to surgery     Past Surgical History  Procedure Laterality Date  . Back surgery      x3  . Left ankle surgery    . Carpal tunnel release      L arm   . 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 allopurinol (ZYLOPRIM) 300 MG tablet Take 1 tablet (300 mg total) by mouth daily.  90 tablet  3  . aspirin 81 MG chewable tablet Chew 81 mg by mouth daily.      . Blood Glucose Monitoring Suppl (ACCU-CHEK AVIVA PLUS) W/DEVICE KIT 1 kit by Does not apply route daily. USE AS DIRECTED  1 kit  0  . clopidogrel (PLAVIX) 75 MG tablet Take 75 mg by mouth daily.      . colchicine 0.6 MG tablet Take 0.6 mg by mouth as needed.       . fish oil-omega-3 fatty acids 1000 MG capsule Take 2 g by mouth daily.       Marland Kitchen glucose blood (ACCU-CHEK AVIVA PLUS) test strip PT IS TO TEST 1 TO 2 TIMES A DAY  300 each  PRN  . isosorbide mononitrate (IMDUR) 30 MG 24 hr tablet Take 1 tablet (30 mg total) by mouth 2 (two) times daily.  180 tablet  3  . Lancets MISC PT IS TO TEST 1 TO 2 TIMES A DAY  300 each  PRN  . metFORMIN (GLUCOPHAGE-XR) 500 MG 24 hr tablet Take 500 mg by mouth daily with breakfast.      . metoprolol tartrate (LOPRESSOR) 25 MG tablet Take 25 mg by mouth 2 (two) times daily.      . nitroGLYCERIN (NITROSTAT) 0.4 MG SL tablet Place 0.4 mg under the tongue every 5 (five) minutes as needed. For chest pain      . quinapril (ACCUPRIL) 20 MG tablet Take 20 mg by mouth 2 (two) times daily.       . simvastatin (ZOCOR) 80 MG tablet Take 80 mg by mouth at bedtime.       No current facility-administered medications for this visit.    No Known Allergies  History   Social History  . Marital Status: Single    Spouse Name: N/A    Number of Children: N/A  . Years of Education: N/A   Occupational History  . Not on file.   Social History Main Topics  . Smoking status: Current Some Day Smoker    Types: Cigars  . Smokeless tobacco: Never Used     Comment: tobacco use - no  . Alcohol Use: 0.6 oz/week    1 Glasses of wine per week  . Drug Use: No  . Sexually Active: Yes   Other Topics Concern  . Not on file   Social History Narrative   Divorced, 1 child.    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    Owned roofing co. In [place]    No family history on file.  Review of Systems:  As stated in the HPI and otherwise negative.   BP 122/60  Pulse 66  Ht 5\' 10"  (1.778 m)  Wt 237 lb (107.502 kg)  BMI 34.01 kg/m2  Physical Examination: General: Well developed, well nourished, NAD HEENT: OP clear, mucus membranes moist SKIN: warm, dry. No rashes. Neuro: No focal deficits Musculoskeletal: Muscle strength 5/5 all ext Psychiatric: Mood and affect normal Neck: No JVD, no carotid bruits, no thyromegaly, no lymphadenopathy. Lungs:Clear bilaterally, no wheezes, rhonci, crackles Cardiovascular: Regular rate and rhythm. No murmurs, gallops or rubs. Abdomen:Soft. Bowel sounds present. Non-tender.  Extremities: Mild bilateral lower extremity edema. Pulses are 2 + in the bilateral DP/PT.  Carotid artery disease: January 2014: Bilateral 60-79% stenosis.   Assessment and Plan:   1. CAD: Stable angina. Cardiac cath as above 01/21/12 with stable CAD. His RCA is occluded distally, not amenable to PCI. The LAD and Circumflex have moderate disease with no lesions that appear to be flow limiting. Continue current meds. Lipids folllowed in primary care. Will ask pt to have lipids and LFTs faxed over when drawn in August.   2. HTN: BP well controlled. No changes today.   3. Carotid artery disease: He has moderate bilateral disease. Will need f/u dopplers January 2015.

## 2012-07-25 NOTE — Patient Instructions (Addendum)
Your physician wants you to follow-up in:  6 months. You will receive a reminder letter in the mail two months in advance. If you don't receive a letter, please call our office to schedule the follow-up appointment.   

## 2012-07-31 ENCOUNTER — Telehealth: Payer: Self-pay | Admitting: Family Medicine

## 2012-07-31 NOTE — Telephone Encounter (Signed)
Go ahead and do this

## 2012-08-01 ENCOUNTER — Encounter: Payer: Self-pay | Admitting: Family Medicine

## 2012-08-01 NOTE — Telephone Encounter (Signed)
Letter faxed today

## 2012-08-03 ENCOUNTER — Telehealth: Payer: Self-pay | Admitting: Family Medicine

## 2012-08-03 NOTE — Telephone Encounter (Signed)
DONE

## 2012-09-12 ENCOUNTER — Ambulatory Visit (INDEPENDENT_AMBULATORY_CARE_PROVIDER_SITE_OTHER): Payer: Medicare Other | Admitting: Family Medicine

## 2012-09-12 ENCOUNTER — Encounter: Payer: Self-pay | Admitting: Family Medicine

## 2012-09-12 VITALS — BP 120/60 | HR 56 | Wt 237.0 lb

## 2012-09-12 DIAGNOSIS — M109 Gout, unspecified: Secondary | ICD-10-CM

## 2012-09-12 DIAGNOSIS — Z79899 Other long term (current) drug therapy: Secondary | ICD-10-CM

## 2012-09-12 DIAGNOSIS — E1142 Type 2 diabetes mellitus with diabetic polyneuropathy: Secondary | ICD-10-CM

## 2012-09-12 DIAGNOSIS — I10 Essential (primary) hypertension: Secondary | ICD-10-CM

## 2012-09-12 DIAGNOSIS — F17200 Nicotine dependence, unspecified, uncomplicated: Secondary | ICD-10-CM

## 2012-09-12 DIAGNOSIS — I2510 Atherosclerotic heart disease of native coronary artery without angina pectoris: Secondary | ICD-10-CM

## 2012-09-12 DIAGNOSIS — E119 Type 2 diabetes mellitus without complications: Secondary | ICD-10-CM

## 2012-09-12 DIAGNOSIS — E1149 Type 2 diabetes mellitus with other diabetic neurological complication: Secondary | ICD-10-CM

## 2012-09-12 DIAGNOSIS — E669 Obesity, unspecified: Secondary | ICD-10-CM

## 2012-09-12 DIAGNOSIS — E785 Hyperlipidemia, unspecified: Secondary | ICD-10-CM

## 2012-09-12 DIAGNOSIS — E1140 Type 2 diabetes mellitus with diabetic neuropathy, unspecified: Secondary | ICD-10-CM

## 2012-09-12 LAB — CBC WITH DIFFERENTIAL/PLATELET
Basophils Absolute: 0 10*3/uL (ref 0.0–0.1)
Basophils Relative: 0 % (ref 0–1)
Eosinophils Absolute: 0.2 10*3/uL (ref 0.0–0.7)
Eosinophils Relative: 3 % (ref 0–5)
HCT: 40.3 % (ref 39.0–52.0)
Hemoglobin: 13.4 g/dL (ref 13.0–17.0)
Lymphocytes Relative: 37 % (ref 12–46)
Lymphs Abs: 2.6 10*3/uL (ref 0.7–4.0)
MCH: 29.8 pg (ref 26.0–34.0)
MCHC: 33.3 g/dL (ref 30.0–36.0)
MCV: 89.8 fL (ref 78.0–100.0)
Monocytes Absolute: 0.7 10*3/uL (ref 0.1–1.0)
Monocytes Relative: 10 % (ref 3–12)
Neutro Abs: 3.5 10*3/uL (ref 1.7–7.7)
Neutrophils Relative %: 50 % (ref 43–77)
Platelets: 116 10*3/uL — ABNORMAL LOW (ref 150–400)
RBC: 4.49 MIL/uL (ref 4.22–5.81)
RDW: 17.6 % — ABNORMAL HIGH (ref 11.5–15.5)
WBC: 7 10*3/uL (ref 4.0–10.5)

## 2012-09-12 LAB — POCT GLYCOSYLATED HEMOGLOBIN (HGB A1C): Hemoglobin A1C: 7.4

## 2012-09-12 MED ORDER — METOPROLOL TARTRATE 25 MG PO TABS: 25.0000 mg | ORAL_TABLET | Freq: Two times a day (BID) | ORAL | Status: DC

## 2012-09-12 MED ORDER — SIMVASTATIN 80 MG PO TABS: 80.0000 mg | ORAL_TABLET | Freq: Every day | ORAL | Status: DC

## 2012-09-12 MED ORDER — QUINAPRIL HCL 20 MG PO TABS: 20.0000 mg | ORAL_TABLET | Freq: Two times a day (BID) | ORAL | Status: DC

## 2012-09-12 MED ORDER — ALLOPURINOL 300 MG PO TABS: 300.0000 mg | ORAL_TABLET | Freq: Every day | ORAL | Status: DC

## 2012-09-12 MED ORDER — CLOPIDOGREL BISULFATE 75 MG PO TABS: 75.0000 mg | ORAL_TABLET | Freq: Every day | ORAL | Status: DC

## 2012-09-12 MED ORDER — METFORMIN HCL ER 500 MG PO TB24: ORAL_TABLET | ORAL | Status: DC

## 2012-09-12 NOTE — Progress Notes (Signed)
Subjective:    Paul [name] is a 72 y.o. male who presents for follow-up of Type 2 diabetes mellitus.    Home blood sugar records: high 140 low 120  Current symptoms/problem none Daily foot checks: yes  Last eye exam:  02/29/12    Medication compliance: good Current diet: no Current exercise: no Known diabetic complications: peripheral neuropathy Cardiovascular risk factors: advanced age (older than 23 for men, 77 for women), diabetes mellitus, dyslipidemia, hypertension, male gender, obesity (BMI >= 30 kg/m2), sedentary lifestyle and smoking/ tobacco exposure He does his own cooking and admits to not having a good diet. He does complain of some difficulty with back pain and has had that the right great toe removed which interferes with functional capacity. He is smoking a few cigarettes per day. He does see the cardiologist intermittently. He has not had any difficulty with his gout. He stopped taking his Neurontin several years ago stating he didn't think it getting good  The following portions of the patient's history were reviewed and updated as appropriate: allergies, current medications, past family history, past medical history, past social history and problem list.  ROS as in subjective above    Objective:    Wt 237 lb (107.502 kg)  BMI 34.01 kg/m2  General appearence: alert, no distress, WD/WN Neck: supple, no lymphadenopathy, no thyromegaly, no masses Heart: RRR, normal S1, S2, no murmurs Lungs: CTA bilaterally, no wheezes, rhonchi, or rales Abdomen: +bs, soft, non tender, non distended, no masses, no hepatomegaly, no splenomegaly Pulses: 2+ symmetric, upper and lower extremities, normal cap refill Ext: no edema Foot exam:  Neuro: foot monofilament exam normal   Lab Review Lab Results  Component Value Date   HGBA1C 7.0% 05/17/2012   Lab Results  Component Value Date   CHOL 134 09/06/2011   HDL 33* 09/06/2011   LDLCALC 75 09/06/2011   TRIG 132 09/06/2011   CHOLHDL  4.1 09/06/2011   No results found for this basenameDerl Barrow     Chemistry      Component Value Date/Time   NA 138 01/20/2012 0946   K 3.8 01/20/2012 0946   CL 104 01/20/2012 0946   CO2 27 01/20/2012 0946   BUN 13 01/20/2012 0946   CREATININE 0.9 01/20/2012 0946   CREATININE 1.05 09/06/2011 1107      Component Value Date/Time   CALCIUM 9.0 01/20/2012 0946   ALKPHOS 46 09/06/2011 1107   AST 24 09/06/2011 1107   ALT 21 09/06/2011 1107   BILITOT 0.5 09/06/2011 1107        Chemistry      Component Value Date/Time   NA 138 01/20/2012 0946   K 3.8 01/20/2012 0946   CL 104 01/20/2012 0946   CO2 27 01/20/2012 0946   BUN 13 01/20/2012 0946   CREATININE 0.9 01/20/2012 0946   CREATININE 1.05 09/06/2011 1107      Component Value Date/Time   CALCIUM 9.0 01/20/2012 0946   ALKPHOS 46 09/06/2011 1107   AST 24 09/06/2011 1107   ALT 21 09/06/2011 1107   BILITOT 0.5 09/06/2011 1107       Last optometry/ophthalmology exam reviewed from:    Assessment:   Encounter Diagnoses  Name Primary?  . Diabetes mellitus type 2, controlled Yes  . ASHD (arteriosclerotic heart disease)   . Diabetic neuropathy   . Dyslipidemia   . Gout   . HTN (hypertension)   . Obesity   . Current smoker   . Encounter for long-term (current) use  of other medications          Plan:    1.  Rx changes: Metformin 500 twice a day 2.  Education: Reviewed 'ABCs' of diabetes management (respective goals in parentheses):  A1C (<7), blood pressure (<130/80), and cholesterol (LDL <100). 3.  Compliance at present is estimated to be poor. Efforts to improve compliance (if necessary) will be directed at none. 4. Follow up: 4 months  Prescription for Zostavax written. Also had a long discussion with him concerning his diet and exercise. [facility]. I encouraged him to quit smoking.

## 2012-09-12 NOTE — Patient Instructions (Addendum)
Try to make some diet changes. [facility] into [facility]. Also work on quitting smoking. If you need help with that let me know.

## 2012-09-13 LAB — LIPID PANEL
Cholesterol: 130 mg/dL (ref 0–200)
HDL: 35 mg/dL — ABNORMAL LOW (ref >39)
LDL Cholesterol: 63 mg/dL (ref 0–99)
Total CHOL/HDL Ratio: 3.7 Ratio
Triglycerides: 162 mg/dL — ABNORMAL HIGH (ref <150)
VLDL: 32 mg/dL (ref 0–40)

## 2012-09-13 LAB — COMPREHENSIVE METABOLIC PANEL
ALT: 14 U/L (ref 0–53)
AST: 15 U/L (ref 0–37)
Albumin: 3.8 g/dL (ref 3.5–5.2)
Alkaline Phosphatase: 45 U/L (ref 39–117)
BUN: 13 mg/dL (ref 6–23)
CO2: 27 mEq/L (ref 19–32)
Calcium: 9.1 mg/dL (ref 8.4–10.5)
Chloride: 105 mEq/L (ref 96–112)
Creat: 0.99 mg/dL (ref 0.50–1.35)
Glucose, Bld: 135 mg/dL — ABNORMAL HIGH (ref 70–99)
Potassium: 4.6 mEq/L (ref 3.5–5.3)
Sodium: 140 mEq/L (ref 135–145)
Total Bilirubin: 0.7 mg/dL (ref 0.3–1.2)
Total Protein: 6.3 g/dL (ref 6.0–8.3)

## 2012-09-13 LAB — URIC ACID: Uric Acid, Serum: 8.1 mg/dL — ABNORMAL HIGH (ref 4.0–7.8)

## 2012-12-07 ENCOUNTER — Other Ambulatory Visit: Payer: Self-pay

## 2013-01-12 ENCOUNTER — Ambulatory Visit (INDEPENDENT_AMBULATORY_CARE_PROVIDER_SITE_OTHER): Payer: Medicare Other | Admitting: Family Medicine

## 2013-01-12 ENCOUNTER — Encounter: Payer: Self-pay | Admitting: Family Medicine

## 2013-01-12 VITALS — BP 122/70 | HR 61 | Wt 236.0 lb

## 2013-01-12 DIAGNOSIS — E119 Type 2 diabetes mellitus without complications: Secondary | ICD-10-CM

## 2013-01-12 DIAGNOSIS — E1149 Type 2 diabetes mellitus with other diabetic neurological complication: Secondary | ICD-10-CM

## 2013-01-12 DIAGNOSIS — E785 Hyperlipidemia, unspecified: Secondary | ICD-10-CM

## 2013-01-12 DIAGNOSIS — E669 Obesity, unspecified: Secondary | ICD-10-CM

## 2013-01-12 DIAGNOSIS — E1140 Type 2 diabetes mellitus with diabetic neuropathy, unspecified: Secondary | ICD-10-CM

## 2013-01-12 DIAGNOSIS — M109 Gout, unspecified: Secondary | ICD-10-CM

## 2013-01-12 DIAGNOSIS — E1142 Type 2 diabetes mellitus with diabetic polyneuropathy: Secondary | ICD-10-CM

## 2013-01-12 DIAGNOSIS — E1169 Type 2 diabetes mellitus with other specified complication: Secondary | ICD-10-CM

## 2013-01-12 DIAGNOSIS — I10 Essential (primary) hypertension: Secondary | ICD-10-CM

## 2013-01-12 DIAGNOSIS — F17200 Nicotine dependence, unspecified, uncomplicated: Secondary | ICD-10-CM

## 2013-01-12 LAB — POCT UA - MICROALBUMIN
Albumin/Creatinine Ratio, Urine, POC: 12.7
Creatinine, POC: 218.5 mg/dL
Microalbumin Ur, POC: 27.8 mg/L

## 2013-01-12 LAB — POCT GLYCOSYLATED HEMOGLOBIN (HGB A1C): Hemoglobin A1C: 7

## 2013-01-12 MED ORDER — VARENICLINE TARTRATE 0.5 MG X 11 & 1 MG X 42 PO MISC: ORAL | Status: DC

## 2013-01-12 NOTE — Progress Notes (Signed)
Subjective:    Paul [name] is a 72 y.o. male who presents for follow-up of Type 2 diabetes mellitus. He has no acute concerns today. He is smoking about 1/2 ppd currently and expresses interest in quitting smoking today. He would like to explore medical therapies available to support quitting smoking. He is not taking allopurinol stating it was causing flares.  Home blood sugar records: fasting range: 97-120...usually around 107 though  Current symptoms/problems include polydipsia and have  been worsening. Daily foot checks: Performing these   Any foot concerns: None today Last eye exam:  Earlier this year   Medication compliance: Good Current diet: Eating 2 meals per day, snacking in between - trying to implement portion control and consuming carbohydrates sparingly Current exercise: walking about 2-3 blocks per day - limited by DOE that is not worsening, as well as chronic lower back pain Known diabetic complications: peripheral neuropathy and cardiovascular disease Cardiovascular risk factors: advanced age (older than 15 for men, 89 for women), diabetes mellitus, dyslipidemia, hypertension, male gender, obesity (BMI >= 30 kg/m2), sedentary lifestyle and smoking/ tobacco exposure   The following portions of the patient's history were reviewed and updated as appropriate: allergies, current medications, past family history, past medical history, past social history, past surgical history and problem list.  ROS as in subjective above    Objective:    BP 122/70  Pulse 61  Wt 236 lb (107.049 kg)  SpO2 98%  Filed Vitals:   01/12/13 0813  BP: 122/70  Pulse: 61    General appearence: alert, no distress, WD/WN Neck: supple, no lymphadenopathy, no thyromegaly, no masses Heart: RRR, normal S1, S2, no murmurs Lungs: CTA bilaterally, no wheezes, rhonchi, or rales Abdomen: +bs, soft, non tender, non distended, no masses, no hepatomegaly, no splenomegaly Pulses: 2+ symmetric, upper  and lower extremities, normal cap refill Ext: no edema Foot exam:  Neuro: Impaired sensation at feet by [place] touch test   Lab Review Lab Results  Component Value Date   HGBA1C 7.4 09/12/2012   Lab Results  Component Value Date   CHOL 130 09/12/2012   HDL 35* 09/12/2012   LDLCALC 63 09/12/2012   TRIG 162* 09/12/2012   CHOLHDL 3.7 09/12/2012   No results found for this basenameDerl Barrow     Chemistry      Component Value Date/Time   NA 140 09/12/2012 0842   K 4.6 09/12/2012 0842   CL 105 09/12/2012 0842   CO2 27 09/12/2012 0842   BUN 13 09/12/2012 0842   CREATININE 0.99 09/12/2012 0842   CREATININE 0.9 01/20/2012 0946      Component Value Date/Time   CALCIUM 9.1 09/12/2012 0842   ALKPHOS 45 09/12/2012 0842   AST 15 09/12/2012 0842   ALT 14 09/12/2012 0842   BILITOT 0.7 09/12/2012 0842        Chemistry      Component Value Date/Time   NA 140 09/12/2012 0842   K 4.6 09/12/2012 0842   CL 105 09/12/2012 0842   CO2 27 09/12/2012 0842   BUN 13 09/12/2012 0842   CREATININE 0.99 09/12/2012 0842   CREATININE 0.9 01/20/2012 0946      Component Value Date/Time   CALCIUM 9.1 09/12/2012 0842   ALKPHOS 45 09/12/2012 0842   AST 15 09/12/2012 0842   ALT 14 09/12/2012 0842   BILITOT 0.7 09/12/2012 0842     Hb A1c 7.0      Assessment/Plan:  1. Diabetes mellitus type 2, controlled Implementing  lifestyle changes and adhering well to anti-diabetic therapy. Will check: - POCT glycosylated hemoglobin (Hb A1C) - POCT UA - Microalbumin  2. Current smoker Will initiate Chantix. Would like to follow up on progress in one month. - varenicline (CHANTIX STARTING MONTH PAK) 0.5 MG X 11 & 1 MG X 42 tablet; Take one 0.5 mg tablet by mouth once daily for 3 days, then increase to one 0.5 mg tablet twice daily for 4 days, then increase to one 1 mg tablet twice daily.  Dispense: 53 tablet; Refill: 0 material given for Chantix. Also encouraged him to call the 800 number to help with smoking  cessation.  3. Diabetic neuropathy Not progressing.  4. Gout Has not had any recent gouty flares.  5. Obesity Implementing lifestyle measures, trying to cut down on portion sizes.   6. HTN (hypertension) Under good control with his metoprolol and quinapril.   7. DM type 2 with diabetic dyslipidemia Reviewed 'ABCs' of diabetes management (respective goals in parentheses):  A1C (<7), blood pressure (<130/80), and cholesterol (LDL <100). Will continue on his Zocor.

## 2013-02-05 ENCOUNTER — Ambulatory Visit (INDEPENDENT_AMBULATORY_CARE_PROVIDER_SITE_OTHER): Payer: Medicare Other | Admitting: Cardiovascular Disease

## 2013-02-05 ENCOUNTER — Encounter: Payer: Self-pay | Admitting: Cardiovascular Disease

## 2013-02-05 VITALS — BP 122/62 | HR 56 | Ht 70.0 in | Wt 235.0 lb

## 2013-02-05 DIAGNOSIS — I739 Peripheral vascular disease, unspecified: Secondary | ICD-10-CM

## 2013-02-05 DIAGNOSIS — E785 Hyperlipidemia, unspecified: Secondary | ICD-10-CM

## 2013-02-05 DIAGNOSIS — I2510 Atherosclerotic heart disease of native coronary artery without angina pectoris: Secondary | ICD-10-CM

## 2013-02-05 DIAGNOSIS — I779 Disorder of arteries and arterioles, unspecified: Secondary | ICD-10-CM

## 2013-02-05 DIAGNOSIS — I10 Essential (primary) hypertension: Secondary | ICD-10-CM

## 2013-02-05 NOTE — Progress Notes (Signed)
History of Present Illness: 73 yo WM with history of CAD with stent RCA, DM here today for cardiology follow up. He was seen in our office 09/07/11 by Richardson Dopp, PA-C for pre-op evaluation. His echo in 2009 showed mild LVH and normal LV function with no significant valvular abnormalities. His cath iin 2009 showed a near total occlusion of the RCA beyond the stent in the mid portion. I attempted PCI and was able to balloon the mid lesion but could never pass a balloon distally. He did well after that. 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 I saw him December 2013 and he c/o chest tightness with minimal exertion associated with SOB. He has noticed his heart pounding but it has not been racing. I arranged a left heart cath on 01/21/12. He was found to have a total occlusion of the RCA which was not amenable to to PCI (PCI attempted in 2009). The LAD and Circumflex had moderate disease. I doubled his Imdur to 30 mg po BID.   He is here today for f/u. Doing well. No chest pain or SOB. He is active in the yard. He has stopped smoking. He married his girlfriend this fall.   Primary Care Physician: Jill Alexanders   Last Lipid Profile:Lipid Panel     Component Value Date/Time   CHOL 130 09/12/2012 0842   TRIG 162* 09/12/2012 0842   HDL 35* 09/12/2012 0842   CHOLHDL 3.7 09/12/2012 0842   VLDL 32 09/12/2012 0842   LDLCALC 63 09/12/2012 0842     Past Medical History  Diagnosis Date  . Diabetes mellitus     with neuropathy  . Hypertension   . AAA (abdominal aortic aneurysm)   . Gout   . Vitamin B12 deficiency   . Hiatal hernia   . CAD (coronary artery disease)     with cath 09/26/08 showing severe heart disease in RCA with POBA, mild to mod diesease in circ and LAD  . Chronic back pain     back problmes. prior to surgery   . Hyperlipidemia     Past Surgical History  Procedure Laterality Date  . Back surgery      x3  . Left ankle surgery    . Carpal tunnel release      L arm   . 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 allopurinol (ZYLOPRIM) 300 MG tablet Take 1 tablet (300 mg total) by mouth daily.  90 tablet  3  . aspirin 81 MG chewable tablet Chew 81 mg by mouth daily.      . Blood Glucose Monitoring Suppl (ACCU-CHEK AVIVA PLUS) W/DEVICE KIT 1 kit by Does not apply route daily. USE AS DIRECTED  1 kit  0  . clopidogrel (PLAVIX) 75 MG tablet Take 1 tablet (75 mg total) by mouth daily.  90 tablet  3  . colchicine 0.6 MG tablet Take 0.6 mg by mouth as needed.       . fish oil-omega-3 fatty acids 1000 MG capsule Take 2 g by mouth daily.       Marland Kitchen glucose blood (ACCU-CHEK AVIVA PLUS) test strip PT IS TO TEST 1 TO 2 TIMES A DAY  300 each  PRN  . isosorbide mononitrate (IMDUR) 30 MG 24 hr tablet Take 1 tablet (30 mg total) by mouth 2 (two) times daily.  180 tablet  3  .  Lancets MISC PT IS TO TEST 1 TO 2 TIMES A DAY  300 each  PRN  . metFORMIN (GLUCOPHAGE-XR) 500 MG 24 hr tablet Take 2 tablets daily  180 tablet  1  . metoprolol tartrate (LOPRESSOR) 25 MG tablet Take 1 tablet (25 mg total) by mouth 2 (two) times daily.  180 tablet  3  . nitroGLYCERIN (NITROSTAT) 0.4 MG SL tablet Place 0.4 mg under the tongue every 5 (five) minutes as needed. For chest pain      . quinapril (ACCUPRIL) 20 MG tablet Take 1 tablet (20 mg total) by mouth 2 (two) times daily.  180 tablet  3  . simvastatin (ZOCOR) 80 MG tablet Take 1 tablet (80 mg total) by mouth at bedtime.  90 tablet  3  . varenicline (CHANTIX STARTING MONTH PAK) 0.5 MG X 11 & 1 MG X 42 tablet Take one 0.5 mg tablet by mouth once daily for 3 days, then increase to one 0.5 mg tablet twice daily for 4 days, then increase to one 1 mg tablet twice daily.  53 tablet  0   No current facility-administered medications for this visit.    No Known Allergies  History   Social History  . Marital Status: Single    Spouse Name: N/A    Number of Children: N/A  .  Years of Education: N/A   Occupational History  . Not on file.   Social History Main Topics  . Smoking status: Former Smoker    Types: Cigars  . Smokeless tobacco: Never Used     Comment: quit smoking cigars right before christmas 2014!  Marland Kitchen Alcohol Use: 0.6 oz/week    1 Glasses of wine per week  . Drug Use: No  . Sexual Activity: Yes   Other Topics Concern  . Not on file   Social History Narrative   Divorced, 1 child.    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    Owned roofing co. In [place]    No family history on file.  Review of Systems:  As stated in the HPI and otherwise negative.   BP 122/62  Pulse 56  Ht _0  (1.778 m)  Wt 235 lb (106.595 kg)  BMI 33.72 kg/m2  Physical Examination: General: Well developed, well nourished, NAD HEENT: OP clear, mucus membranes moist SKIN: warm, dry. No rashes. Neuro: No focal deficits Musculoskeletal: Muscle strength 5/5 all ext Psychiatric: Mood and affect normal Neck: No JVD, no carotid bruits, no thyromegaly, no lymphadenopathy. Lungs:Clear bilaterally, no wheezes, rhonci, crackles Cardiovascular: Regular rate and rhythm. No murmurs, gallops or rubs. Abdomen:Soft. Bowel sounds present. Non-tender.  Extremities: Mild bilateral lower extremity edema. Pulses are 2 + in the bilateral DP/PT.  Carotid artery disease: January 2014: Bilateral 60-79% stenosis.   EKG: Sinus brady, rate 56 bpm.   Assessment and Plan:   1. CAD: Stable angina. Cardiac cath as above 01/21/12 with stable CAD. His RCA is occluded distally, not amenable to PCI. The LAD and Circumflex have moderate disease with no lesions that appear to be flow limiting. Continue current meds. Lipids folllowed in primary care and well controlled.   2. HTN: BP well controlled. No changes today.   3. Carotid artery disease: He has moderate bilateral disease. Will need f/u dopplers this year. Will arrange at f/u visit.   4. Hyperlipidemia:  Lipids well controlled. Continue statin.

## 2013-02-05 NOTE — Patient Instructions (Signed)
Your physician wants you to follow-up in:  6 months. You will receive a reminder letter in the mail two months in advance. If you don't receive a letter, please call our office to schedule the follow-up appointment.   

## 2013-02-13 ENCOUNTER — Encounter: Payer: Self-pay | Admitting: Family Medicine

## 2013-02-13 ENCOUNTER — Ambulatory Visit (INDEPENDENT_AMBULATORY_CARE_PROVIDER_SITE_OTHER): Payer: Medicare Other | Admitting: Family Medicine

## 2013-02-13 VITALS — BP 130/72 | HR 68 | Wt 248.0 lb

## 2013-02-13 DIAGNOSIS — F17200 Nicotine dependence, unspecified, uncomplicated: Secondary | ICD-10-CM

## 2013-02-13 NOTE — Progress Notes (Signed)
   Subjective:    Patient ID: Paul [name], male    DOB: February 12, 1940, 73 y.o.   MRN: [id number]  HPI He is here for followup visit concerning smoking. He quit smoking on his own at approximately the end of December. He has been smoke free for last 2 weeks. He reports no urge to start smoking. He has no particular concerns or complaints. He did see his cardiologist January 5 and was given a good report.   Review of Systems     Objective:   Physical Exam Alert and in no distress otherwise not examined       Assessment & Plan:  Current smoker  encouraged him to call me if he has a desire to smoke again otherwise I will see him in 4 months for routine followup on diabetes etc.

## 2013-03-09 ENCOUNTER — Telehealth: Payer: Self-pay | Admitting: Family Medicine

## 2013-03-09 NOTE — Telephone Encounter (Signed)
Pt called and stated that he was given this medication by a dermatologist a while back and is running out. Pt states he uses this periodically when he has issues with his feet and legs. Pt is requesting that Clobetasol cream .05 be called into [facility] [address]

## 2013-03-12 MED ORDER — CLOBETASOL PROPIONATE 0.05 % EX CREA: 1.0000 "application " | TOPICAL_CREAM | Freq: Two times a day (BID) | CUTANEOUS | Status: DC

## 2013-03-13 ENCOUNTER — Other Ambulatory Visit: Payer: Self-pay | Admitting: Family Medicine

## 2013-03-14 NOTE — Telephone Encounter (Signed)
Is this ok to refill?  

## 2013-03-16 ENCOUNTER — Telehealth: Payer: Self-pay | Admitting: Family Medicine

## 2013-03-16 NOTE — Telephone Encounter (Signed)
Please handle

## 2013-03-19 ENCOUNTER — Telehealth: Payer: Self-pay | Admitting: Internal Medicine

## 2013-03-19 DIAGNOSIS — E119 Type 2 diabetes mellitus without complications: Secondary | ICD-10-CM

## 2013-03-19 MED ORDER — METFORMIN HCL ER 500 MG PO TB24
ORAL_TABLET | ORAL | Status: DC
Start: 2013-03-19 — End: 2013-08-02

## 2013-03-19 NOTE — Telephone Encounter (Signed)
done

## 2013-03-19 NOTE — Telephone Encounter (Signed)
i meant to send this to you

## 2013-03-19 NOTE — Telephone Encounter (Signed)
Refill request for [facility]

## 2013-04-26 ENCOUNTER — Ambulatory Visit (INDEPENDENT_AMBULATORY_CARE_PROVIDER_SITE_OTHER): Payer: Medicare Other | Admitting: Family Medicine

## 2013-04-26 ENCOUNTER — Encounter: Payer: Self-pay | Admitting: Family Medicine

## 2013-04-26 VITALS — BP 110/52 | HR 68 | Wt 241.0 lb

## 2013-04-26 DIAGNOSIS — R51 Headache: Secondary | ICD-10-CM

## 2013-04-26 NOTE — Progress Notes (Signed)
   Subjective:    Patient ID: Paul [name], male    DOB: 09-06-1940, 73 y.o.   MRN: [id number]  HPI He is here for evaluation of left-sided headache. He states that the headache occurs only when he takes an afternoon nap. He does sleep on either side but usually wakes up only on his left side. He describes his pain as left temporal but does get relief with use of 3 Advil within 20 minutes. He does not note the pain when he states goes to sleep at night and wakes up in the morning. No blurred vision, double vision, numbness or tingling.   Review of Systems     Objective:   Physical Exam Alert and in no distress. No pain on palpation over the temporal artery. EOMI no carotid bruits noted. No pain on motion of his neck. Cardiac and lung exam normal       Assessment & Plan:  Headache(784.0)  I explained that his headache is atypical but not related to anything specific. Encouraged him to use Advil before he takes his afternoon nap or not take a nap.

## 2013-04-26 NOTE — Patient Instructions (Signed)
Keep your head in the neutral position as much is possible. Take the 3 Advil before you go to bed and see if that will prevent

## 2013-06-14 ENCOUNTER — Ambulatory Visit
Admission: RE | Admit: 2013-06-14 | Discharge: 2013-06-14 | Disposition: A | Discharge status: Home / Self Care | Payer: Medicare Other | Source: Ambulatory Visit | Attending: Family Medicine | Admitting: Family Medicine

## 2013-06-14 ENCOUNTER — Encounter: Payer: Self-pay | Admitting: Family Medicine

## 2013-06-14 ENCOUNTER — Ambulatory Visit (INDEPENDENT_AMBULATORY_CARE_PROVIDER_SITE_OTHER): Payer: Medicare Other | Admitting: Family Medicine

## 2013-06-14 VITALS — BP 132/72 | HR 84 | Wt 242.0 lb

## 2013-06-14 DIAGNOSIS — E119 Type 2 diabetes mellitus without complications: Secondary | ICD-10-CM

## 2013-06-14 DIAGNOSIS — M25551 Pain in right hip: Secondary | ICD-10-CM

## 2013-06-14 DIAGNOSIS — L989 Disorder of the skin and subcutaneous tissue, unspecified: Secondary | ICD-10-CM

## 2013-06-14 DIAGNOSIS — E1142 Type 2 diabetes mellitus with diabetic polyneuropathy: Secondary | ICD-10-CM

## 2013-06-14 DIAGNOSIS — M25561 Pain in right knee: Secondary | ICD-10-CM

## 2013-06-14 DIAGNOSIS — M25562 Pain in left knee: Secondary | ICD-10-CM

## 2013-06-14 DIAGNOSIS — M25552 Pain in left hip: Secondary | ICD-10-CM

## 2013-06-14 DIAGNOSIS — I10 Essential (primary) hypertension: Secondary | ICD-10-CM

## 2013-06-14 DIAGNOSIS — M25559 Pain in unspecified hip: Secondary | ICD-10-CM

## 2013-06-14 DIAGNOSIS — E1149 Type 2 diabetes mellitus with other diabetic neurological complication: Secondary | ICD-10-CM

## 2013-06-14 DIAGNOSIS — M25569 Pain in unspecified knee: Secondary | ICD-10-CM

## 2013-06-14 DIAGNOSIS — E1140 Type 2 diabetes mellitus with diabetic neuropathy, unspecified: Secondary | ICD-10-CM

## 2013-06-14 LAB — POCT GLYCOSYLATED HEMOGLOBIN (HGB A1C): Hemoglobin A1C: 7.3

## 2013-06-14 NOTE — Progress Notes (Signed)
   Subjective:    Patient ID: Paul [name], male    DOB: [birth year], 73 y.o.   MRN: [id number]  HPI He is here for a followup visit. He does check his blood sugars regularly and they're in the low 120s. He is scheduled for an eye exam next month. He does check his feet regularly. 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 His exercise is minimal mainly due to knee and hip pain that he states has been slowly getting worse especially over the last year. He has had no falls. He does have a lesion present on the right forearm that has been bothering him for years.    Review of Systems     Objective:   Physical Exam Alert and in no distress. Normal motion of the hips with minimal discomfort. Bilateral knee exam shows no effusion and minimal lipping. No crepitus noted. No laxity noted. Hemoglobin A1c is 7.3      Assessment & Plan:  Diabetes mellitus type 2, controlled - Plan: POCT glycosylated hemoglobin (Hb A1C)  Hip pain, bilateral - Plan: DG Hip Bilateral W/Pelvis  Skin lesion of right arm  Knee pain, bilateral - Plan: DG Knee 1-2 Views Left, DG Knee 1-2 Views Right  HTN (hypertension)  Diabetic neuropathy  the x-rays were reviewed and show some minimal arthritic changes. I recommended continuing with anti-inflammatory of choice however if he would like to be referred to orthopedics I will do that. Otherwise he will continue on his present medication regimen. I complimented him on his quitting smoking.

## 2013-06-14 NOTE — Progress Notes (Signed)
   Subjective:    Patient ID: Paul [name], male    DOB: 1940-08-01, 73 y.o.   MRN: [id number]  HPI    Review of Systems     Objective:   Physical Exam He has a 1 cm raised crusty lesion with recent incomplete healing with scabbing present on the right forearm.       Assessment & Plan:  Skin lesion. Recommend he come back for excision.

## 2013-06-26 ENCOUNTER — Encounter: Payer: Self-pay | Admitting: Family Medicine

## 2013-06-26 ENCOUNTER — Ambulatory Visit (INDEPENDENT_AMBULATORY_CARE_PROVIDER_SITE_OTHER): Payer: Medicare Other | Admitting: Family Medicine

## 2013-06-26 VITALS — Wt 243.0 lb

## 2013-06-26 DIAGNOSIS — L989 Disorder of the skin and subcutaneous tissue, unspecified: Secondary | ICD-10-CM

## 2013-06-26 NOTE — Progress Notes (Signed)
   Subjective:    Patient ID: Paul [name], male    DOB: Sep 28, 1940, 73 y.o.   MRN: [id number]  HPI He is here for excision of a lesion of approximately 1 cm on his right forearm. He has been there for several years but lately it is grown slightly and is now itching.   Review of Systems     Objective:   Physical Exam Slightly larger than 1 cm lesion is noted on the right forearm. It is round and raised and dry.       Assessment & Plan:  Lesion of upper extremity  an elliptical excision approximately 3 cm was made and the lesion was removed without difficulty. The wound was closed with 3 5-0 Ethilon sutures. He is to keep the area clean and dry and return here in one week.

## 2013-07-02 ENCOUNTER — Telehealth: Payer: Self-pay | Admitting: Family Medicine

## 2013-07-02 NOTE — Telephone Encounter (Signed)
Medical records request [facility] clinic sent

## 2013-07-03 ENCOUNTER — Ambulatory Visit (INDEPENDENT_AMBULATORY_CARE_PROVIDER_SITE_OTHER): Payer: Medicare Other | Admitting: Family Medicine

## 2013-07-03 DIAGNOSIS — L989 Disorder of the skin and subcutaneous tissue, unspecified: Secondary | ICD-10-CM

## 2013-07-03 NOTE — Progress Notes (Signed)
   Subjective:    Patient ID: Paul [name], male    DOB: 1940-11-09, 73 y.o.   MRN: [id number]  HPI He is here for suture removal.  Review of Systems     Objective:   Physical Exam Exam of the right forearm does show 3 sutures with some slight erythema around the stitches       Assessment & Plan:  Lesion of upper extremity  sutures were removed without difficulty. The pathology report is not back yet.

## 2013-08-02 ENCOUNTER — Telehealth: Payer: Self-pay

## 2013-08-02 DIAGNOSIS — E119 Type 2 diabetes mellitus without complications: Secondary | ICD-10-CM

## 2013-08-02 MED ORDER — METFORMIN HCL ER 500 MG PO TB24: ORAL_TABLET | ORAL | Status: DC

## 2013-08-02 NOTE — Telephone Encounter (Signed)
Done and patient informed.

## 2013-08-02 NOTE — Telephone Encounter (Signed)
Pt states he needs 10 metformin to last him until he gets his mail order in. Send to [facility] on [address].

## 2013-08-08 ENCOUNTER — Encounter: Payer: Self-pay | Admitting: Cardiovascular Disease

## 2013-08-08 ENCOUNTER — Ambulatory Visit (INDEPENDENT_AMBULATORY_CARE_PROVIDER_SITE_OTHER): Payer: Medicare Other | Admitting: Cardiovascular Disease

## 2013-08-08 VITALS — BP 128/58 | HR 56 | Ht 70.0 in | Wt 238.0 lb

## 2013-08-08 DIAGNOSIS — I10 Essential (primary) hypertension: Secondary | ICD-10-CM

## 2013-08-08 DIAGNOSIS — E785 Hyperlipidemia, unspecified: Secondary | ICD-10-CM

## 2013-08-08 DIAGNOSIS — I2510 Atherosclerotic heart disease of native coronary artery without angina pectoris: Secondary | ICD-10-CM

## 2013-08-08 DIAGNOSIS — I739 Peripheral vascular disease, unspecified: Secondary | ICD-10-CM

## 2013-08-08 DIAGNOSIS — I779 Disorder of arteries and arterioles, unspecified: Secondary | ICD-10-CM

## 2013-08-08 NOTE — Patient Instructions (Signed)
Your physician wants you to follow-up in:  6 months. You will receive a reminder letter in the mail two months in advance. If you don't receive a letter, please call our office to schedule the follow-up appointment.  Your physician has requested that you have a carotid duplex. This test is an ultrasound of the carotid arteries in your neck. It looks at blood flow through these arteries that supply the brain with blood. Allow one hour for this exam. There are no restrictions or special instructions.

## 2013-08-08 NOTE — Progress Notes (Signed)
History of Present Illness: 73 yo WM with history of CAD with stent RCA, DM here today for cardiology follow up. His echo in 2009 showed mild LVH and normal LV function with no significant valvular abnormalities. His cath iin 2009 showed a near total occlusion of the RCA beyond the stent in the mid portion. I attempted PCI and was able to balloon the mid lesion but could never pass a balloon distally. He did well after that.  I saw him December 2013 and he c/o chest tightness with minimal exertion associated with SOB. He has noticed his heart pounding but it has not been racing. I arranged a left heart cath on 01/21/12. He was found to have a total occlusion of the RCA which was not amenable to PCI (PCI attempted in 2009). The LAD and Circumflex had moderate disease. I doubled his Imdur to 30 mg po BID.   He is here today for f/u. Doing well. No chest pain or SOB. He is active in the yard. He has stopped smoking. He married his girlfriend in the fall of 2014.  Primary Care Physician: Jill Alexanders   Last Lipid Profile:Lipid Panel     Component Value Date/Time   CHOL 130 09/12/2012 0842   TRIG 162* 09/12/2012 0842   HDL 35* 09/12/2012 0842   CHOLHDL 3.7 09/12/2012 0842   VLDL 32 09/12/2012 0842   LDLCALC 63 09/12/2012 0842     Past Medical History  Diagnosis Date  . Diabetes mellitus     with neuropathy  . Hypertension   . AAA (abdominal aortic aneurysm)   . Gout   . Vitamin B12 deficiency   . Hiatal hernia   . CAD (coronary artery disease)     with cath 09/26/08 showing severe heart disease in RCA with POBA, mild to mod diesease in circ and LAD  . Chronic back pain     back problmes. prior to surgery   . Hyperlipidemia     Past Surgical History  Procedure Laterality Date  . Back surgery      x3  . Left ankle surgery    . Carpal tunnel release      L arm   . 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 allopurinol  (ZYLOPRIM) 300 MG tablet Take 300 mg by mouth daily.      Marland Kitchen aspirin 81 MG chewable tablet Chew 81 mg by mouth daily.      . clobetasol cream (TEMOVATE) 4.46 % Apply 1 application topically 2 (two) times daily.  45 g  1  . clopidogrel (PLAVIX) 75 MG tablet Take 1 tablet (75 mg total) by mouth daily.  90 tablet  3  . fish oil-omega-3 fatty acids 1000 MG capsule Take 2 g by mouth daily.       Marland Kitchen gabapentin (NEURONTIN) 100 MG capsule Take 1 capsule by mouth two times daily  180 capsule  3  . isosorbide mononitrate (IMDUR) 30 MG 24 hr tablet Take 1 tablet (30 mg total) by mouth 2 (two) times daily.  180 tablet  3  . metFORMIN (GLUCOPHAGE-XR) 500 MG 24 hr tablet Take 2 tablets daily  14 tablet  0  . metoprolol tartrate (LOPRESSOR) 25 MG tablet Take 1 tablet by mouth two  times daily  180 tablet  1  . nitroGLYCERIN (NITROSTAT) 0.4 MG SL tablet Place 0.4 mg under the tongue every  5 (five) minutes as needed. For chest pain      . quinapril (ACCUPRIL) 20 MG tablet Take 1 tablet (20 mg total) by mouth 2 (two) times daily.  180 tablet  3  . simvastatin (ZOCOR) 80 MG tablet Take 1 tablet (80 mg total) by mouth at bedtime.  90 tablet  3  . Blood Glucose Monitoring Suppl (ACCU-CHEK AVIVA PLUS) W/DEVICE KIT 1 kit by Does not apply route daily. USE AS DIRECTED  1 kit  0  . colchicine 0.6 MG tablet Take 0.6 mg by mouth as needed.       Marland Kitchen glucose blood (ACCU-CHEK AVIVA PLUS) test strip PT IS TO TEST 1 TO 2 TIMES A DAY  300 each  PRN  . Lancets MISC PT IS TO TEST 1 TO 2 TIMES A DAY  300 each  PRN   No current facility-administered medications for this visit.    No Known Allergies  History   Social History  . Marital Status: Single    Spouse Name: N/A    Number of Children: N/A  . Years of Education: N/A   Occupational History  . Not on file.   Social History Main Topics  . Smoking status: Light Tobacco Smoker    Types: Cigars  . Smokeless tobacco: Never Used     Comment: quit smoking cigars right  before christmas 2014!  Marland Kitchen Alcohol Use: 0.6 oz/week    1 Glasses of wine per week  . Drug Use: No  . Sexual Activity: Yes   Other Topics Concern  . Not on file   Social History Narrative   Divorced, 1 child.    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    Owned roofing co. In [place]    No family history on file.  Review of Systems:  As stated in the HPI and otherwise negative.   BP 128/58  Pulse 56  Ht '5\' 10"'  (1.778 m)  Wt 238 lb (107.956 kg)  BMI 34.15 kg/m2  Physical Examination: General: Well developed, well nourished, NAD HEENT: OP clear, mucus membranes moist SKIN: warm, dry. No rashes. Neuro: No focal deficits Musculoskeletal: Muscle strength 5/5 all ext Psychiatric: Mood and affect normal Neck: No JVD, no carotid bruits, no thyromegaly, no lymphadenopathy. Lungs:Clear bilaterally, no wheezes, rhonci, crackles Cardiovascular: Regular rate and rhythm. No murmurs, gallops or rubs. Abdomen:Soft. Bowel sounds present. Non-tender.  Extremities: Mild bilateral lower extremity edema. Pulses are 2 + in the bilateral DP/PT.  Carotid artery disease: January 2014: Bilateral 60-79% stenosis.   Assessment and Plan:   1. CAD: Stable angina. Cardiac cath as above 01/21/12 with stable CAD. His RCA is occluded distally, not amenable to PCI. The LAD and Circumflex have moderate disease with no lesions that appear to be flow limiting. Continue current meds. Lipids folllowed in primary care and well controlled.   2. HTN: BP well controlled. No changes today.   3. Carotid artery disease: He has moderate bilateral disease. Will need f/u dopplers this year. Will arrange this summer.   4. Hyperlipidemia: Lipids well controlled. Continue statin.

## 2013-08-14 ENCOUNTER — Ambulatory Visit (HOSPITAL_COMMUNITY): Payer: Medicare Other | Attending: Cardiology | Admitting: *Deleted

## 2013-08-14 DIAGNOSIS — I739 Peripheral vascular disease, unspecified: Secondary | ICD-10-CM | POA: Insufficient documentation

## 2013-08-14 DIAGNOSIS — I779 Disorder of arteries and arterioles, unspecified: Secondary | ICD-10-CM | POA: Insufficient documentation

## 2013-08-14 DIAGNOSIS — I2510 Atherosclerotic heart disease of native coronary artery without angina pectoris: Secondary | ICD-10-CM | POA: Insufficient documentation

## 2013-08-14 DIAGNOSIS — F17200 Nicotine dependence, unspecified, uncomplicated: Secondary | ICD-10-CM | POA: Insufficient documentation

## 2013-08-14 DIAGNOSIS — I10 Essential (primary) hypertension: Secondary | ICD-10-CM | POA: Insufficient documentation

## 2013-08-14 DIAGNOSIS — I6529 Occlusion and stenosis of unspecified carotid artery: Secondary | ICD-10-CM | POA: Insufficient documentation

## 2013-08-14 DIAGNOSIS — I714 Abdominal aortic aneurysm, without rupture, unspecified: Secondary | ICD-10-CM | POA: Insufficient documentation

## 2013-08-14 DIAGNOSIS — R0989 Other specified symptoms and signs involving the circulatory and respiratory systems: Secondary | ICD-10-CM | POA: Insufficient documentation

## 2013-08-14 DIAGNOSIS — E119 Type 2 diabetes mellitus without complications: Secondary | ICD-10-CM | POA: Insufficient documentation

## 2013-08-14 NOTE — Progress Notes (Signed)
Carotid duplex complete 

## 2013-08-15 LAB — HM DIABETES EYE EXAM

## 2013-08-17 ENCOUNTER — Encounter: Payer: Self-pay | Admitting: Family Medicine

## 2013-08-17 ENCOUNTER — Encounter: Payer: Self-pay | Admitting: Internal Medicine

## 2013-08-19 ENCOUNTER — Other Ambulatory Visit: Payer: Self-pay | Admitting: Cardiovascular Disease

## 2013-08-19 ENCOUNTER — Other Ambulatory Visit: Payer: Self-pay | Admitting: Family Medicine

## 2013-09-06 ENCOUNTER — Other Ambulatory Visit: Payer: Self-pay | Admitting: Family Medicine

## 2013-09-27 ENCOUNTER — Ambulatory Visit (INDEPENDENT_AMBULATORY_CARE_PROVIDER_SITE_OTHER): Payer: Medicare Other | Admitting: Family Medicine

## 2013-09-27 VITALS — BP 110/60 | HR 58 | Wt 239.0 lb

## 2013-09-27 DIAGNOSIS — E1142 Type 2 diabetes mellitus with diabetic polyneuropathy: Secondary | ICD-10-CM

## 2013-09-27 DIAGNOSIS — Z23 Encounter for immunization: Secondary | ICD-10-CM

## 2013-09-27 DIAGNOSIS — E1149 Type 2 diabetes mellitus with other diabetic neurological complication: Secondary | ICD-10-CM

## 2013-09-27 DIAGNOSIS — B351 Tinea unguium: Secondary | ICD-10-CM

## 2013-09-27 MED ORDER — TERBINAFINE HCL 250 MG PO TABS: 250.0000 mg | ORAL_TABLET | Freq: Every day | ORAL | Status: DC

## 2013-09-27 NOTE — Progress Notes (Signed)
   Subjective:    Patient ID: Paul [name], male    DOB: [birth year], 73 y.o.   MRN: [id number]  HPI He is here for evaluation of difficulty with the left foot, specifically his toenail. It is becoming quite sick he has no sensation in his feet which has been documented. He has had the right great toe already surgically removed. He states that it does not hurt. He does do visual inspection of this.   Review of Systems     Objective:   Physical Exam Decreased sensation to both feet. Pulses were not palpable. The left and right feet are both cool. He does have good capillary refill.       Assessment & Plan:  Diabetic polyneuropathy associated with type 2 diabetes mellitus - Plan: Lower Extremity Arterial Doppler Bilateral, HM DIABETES FOOT EXAM, HM DIABETES FOOT EXAM  Need for prophylactic vaccination against Streptococcus pneumoniae (pneumococcus) - Plan: Pneumococcal conjugate vaccine 13-valent  Onychomycosis of left great toe - Plan: terbinafine (LAMISIL) 250 MG tablet  recheck here in 2 month

## 2013-10-01 ENCOUNTER — Ambulatory Visit (HOSPITAL_COMMUNITY)
Admission: RE | Admit: 2013-10-01 | Discharge: 2013-10-01 | Disposition: A | Discharge status: Home / Self Care | Payer: Medicare Other | Source: Ambulatory Visit | Attending: Family Medicine | Admitting: Family Medicine

## 2013-10-01 DIAGNOSIS — R238 Other skin changes: Secondary | ICD-10-CM | POA: Diagnosis present

## 2013-10-01 DIAGNOSIS — R209 Unspecified disturbances of skin sensation: Secondary | ICD-10-CM | POA: Diagnosis present

## 2013-10-01 DIAGNOSIS — E1142 Type 2 diabetes mellitus with diabetic polyneuropathy: Secondary | ICD-10-CM | POA: Diagnosis not present

## 2013-10-01 DIAGNOSIS — S98119A Complete traumatic amputation of unspecified great toe, initial encounter: Secondary | ICD-10-CM | POA: Insufficient documentation

## 2013-10-01 DIAGNOSIS — R0989 Other specified symptoms and signs involving the circulatory and respiratory systems: Secondary | ICD-10-CM

## 2013-10-01 DIAGNOSIS — E1149 Type 2 diabetes mellitus with other diabetic neurological complication: Secondary | ICD-10-CM | POA: Diagnosis not present

## 2013-10-01 NOTE — Progress Notes (Addendum)
VASCULAR LAB PRELIMINARY  ARTERIAL  ABI completed: bilateral normal ABI study    RIGHT    LEFT    PRESSURE WAVEFORM  PRESSURE WAVEFORM  BRACHIAL 146 Tri BRACHIAL 134 Tri  DP   DP    AT 153 Bi AT 154 Bi  PT 144 Bi PT 157 Bi  PER   PER    GREAT TOE  NA GREAT TOE  NA    RIGHT LEFT  ABI 1.05 1.08      Landry Mellow, RDMS, RVT  10/01/2013, 1:09 PM

## 2013-11-01 ENCOUNTER — Other Ambulatory Visit: Payer: Self-pay

## 2013-11-01 ENCOUNTER — Telehealth: Payer: Self-pay | Admitting: Internal Medicine

## 2013-11-01 MED ORDER — METOPROLOL TARTRATE 25 MG PO TABS: ORAL_TABLET | ORAL | Status: DC

## 2013-11-01 NOTE — Telephone Encounter (Signed)
Refill request for metoprolol 25mg  #90 to [facility]

## 2013-11-01 NOTE — Telephone Encounter (Signed)
DONE

## 2014-01-02 ENCOUNTER — Encounter: Payer: Self-pay | Admitting: Family Medicine

## 2014-01-02 ENCOUNTER — Ambulatory Visit (INDEPENDENT_AMBULATORY_CARE_PROVIDER_SITE_OTHER): Payer: Medicare Other | Admitting: Family Medicine

## 2014-01-02 VITALS — BP 114/70 | HR 55 | Wt 240.0 lb

## 2014-01-02 DIAGNOSIS — E119 Type 2 diabetes mellitus without complications: Secondary | ICD-10-CM

## 2014-01-02 DIAGNOSIS — I10 Essential (primary) hypertension: Secondary | ICD-10-CM

## 2014-01-02 DIAGNOSIS — E1142 Type 2 diabetes mellitus with diabetic polyneuropathy: Secondary | ICD-10-CM

## 2014-01-02 DIAGNOSIS — B351 Tinea unguium: Secondary | ICD-10-CM

## 2014-01-02 DIAGNOSIS — E1169 Type 2 diabetes mellitus with other specified complication: Secondary | ICD-10-CM

## 2014-01-02 DIAGNOSIS — I2510 Atherosclerotic heart disease of native coronary artery without angina pectoris: Secondary | ICD-10-CM

## 2014-01-02 DIAGNOSIS — E1159 Type 2 diabetes mellitus with other circulatory complications: Secondary | ICD-10-CM

## 2014-01-02 DIAGNOSIS — M109 Gout, unspecified: Secondary | ICD-10-CM

## 2014-01-02 DIAGNOSIS — E785 Hyperlipidemia, unspecified: Secondary | ICD-10-CM

## 2014-01-02 DIAGNOSIS — I152 Hypertension secondary to endocrine disorders: Secondary | ICD-10-CM

## 2014-01-02 LAB — LIPID PANEL
Cholesterol: 129 mg/dL (ref 0–200)
HDL: 37 mg/dL — ABNORMAL LOW (ref >39)
LDL Cholesterol: 55 mg/dL (ref 0–99)
Total CHOL/HDL Ratio: 3.5 Ratio
Triglycerides: 187 mg/dL — ABNORMAL HIGH (ref <150)
VLDL: 37 mg/dL (ref 0–40)

## 2014-01-02 LAB — CBC WITH DIFFERENTIAL/PLATELET
Basophils Absolute: 0.1 10*3/uL (ref 0.0–0.1)
Basophils Relative: 1 % (ref 0–1)
Eosinophils Absolute: 0.1 10*3/uL (ref 0.0–0.7)
Eosinophils Relative: 2 % (ref 0–5)
HCT: 37.7 % — ABNORMAL LOW (ref 39.0–52.0)
Hemoglobin: 12.8 g/dL — ABNORMAL LOW (ref 13.0–17.0)
Lymphocytes Relative: 35 % (ref 12–46)
Lymphs Abs: 2.3 10*3/uL (ref 0.7–4.0)
MCH: 31.6 pg (ref 26.0–34.0)
MCHC: 34 g/dL (ref 30.0–36.0)
MCV: 93.1 fL (ref 78.0–100.0)
MPV: 10.3 fL (ref 9.4–12.4)
Monocytes Absolute: 0.5 10*3/uL (ref 0.1–1.0)
Monocytes Relative: 7 % (ref 3–12)
Neutro Abs: 3.6 10*3/uL (ref 1.7–7.7)
Neutrophils Relative %: 55 % (ref 43–77)
Platelets: 125 10*3/uL — ABNORMAL LOW (ref 150–400)
RBC: 4.05 MIL/uL — ABNORMAL LOW (ref 4.22–5.81)
RDW: 16.3 % — ABNORMAL HIGH (ref 11.5–15.5)
WBC: 6.5 10*3/uL (ref 4.0–10.5)

## 2014-01-02 LAB — COMPREHENSIVE METABOLIC PANEL
ALT: 18 U/L (ref 0–53)
AST: 17 U/L (ref 0–37)
Albumin: 3.7 g/dL (ref 3.5–5.2)
Alkaline Phosphatase: 46 U/L (ref 39–117)
BUN: 23 mg/dL (ref 6–23)
CO2: 24 mEq/L (ref 19–32)
Calcium: 9.1 mg/dL (ref 8.4–10.5)
Chloride: 106 mEq/L (ref 96–112)
Creat: 0.95 mg/dL (ref 0.50–1.35)
Glucose, Bld: 172 mg/dL — ABNORMAL HIGH (ref 70–99)
Potassium: 4.4 mEq/L (ref 3.5–5.3)
Sodium: 136 mEq/L (ref 135–145)
Total Bilirubin: 0.5 mg/dL (ref 0.2–1.2)
Total Protein: 6.1 g/dL (ref 6.0–8.3)

## 2014-01-02 LAB — POCT GLYCOSYLATED HEMOGLOBIN (HGB A1C): Hemoglobin A1C: 7.2

## 2014-01-02 MED ORDER — TERBINAFINE HCL 250 MG PO TABS: 250.0000 mg | ORAL_TABLET | Freq: Every day | ORAL | Status: DC

## 2014-01-02 NOTE — Progress Notes (Signed)
  Subjective:    Paul [name] is a 72 y.o. male who presents for follow-up of Type 2 diabetes mellitus.  He is involved in a gout protocol. He continues on Lamisil for treatment of his onychomycosis and has seen very little response. He does have a previous surgery on the right foot with loss of great and second toe with a revision several years ago. Presently he is having no difficulty but continues on his neuropathy medication.  Home blood sugar records: Patient test B/S once a day  Current symptoms/problems include none Daily foot checks: Any foot concerns: yes continued difficulty with onychomycosis Last eye exam:  08/14/13  Medication compliance: Good Current diet: none Current exercise: none Known diabetic complications: nephropathy and cardiovascular disease Cardiovascular risk factors: advanced age (older than 61 for men, 74 for women), diabetes mellitus, dyslipidemia, hypertension, male gender, obesity (BMI >= 30 kg/m2) and sedentary lifestyle     ROS as in subjective above    Objective:   General appearence: alert, no distress, WD/WN  Lab Review Lab Results  Component Value Date   HGBA1C 7.3% 06/14/2013   Lab Results  Component Value Date   CHOL 130 09/12/2012   HDL 35* 09/12/2012   LDLCALC 63 09/12/2012   TRIG 162* 09/12/2012   CHOLHDL 3.7 09/12/2012   No results found for: Derl Barrow   Chemistry      Component Value Date/Time   NA 140 09/12/2012 0842   K 4.6 09/12/2012 0842   CL 105 09/12/2012 0842   CO2 27 09/12/2012 0842   BUN 13 09/12/2012 0842   CREATININE 0.99 09/12/2012 0842   CREATININE 0.9 01/20/2012 0946      Component Value Date/Time   CALCIUM 9.1 09/12/2012 0842   ALKPHOS 45 09/12/2012 0842   AST 15 09/12/2012 0842   ALT 14 09/12/2012 0842   BILITOT 0.7 09/12/2012 0842        Chemistry      Component Value Date/Time   NA 140 09/12/2012 0842   K 4.6 09/12/2012 0842   CL 105 09/12/2012 0842   CO2 27 09/12/2012 0842    BUN 13 09/12/2012 0842   CREATININE 0.99 09/12/2012 0842   CREATININE 0.9 01/20/2012 0946      Component Value Date/Time   CALCIUM 9.1 09/12/2012 0842   ALKPHOS 45 09/12/2012 0842   AST 15 09/12/2012 0842   ALT 14 09/12/2012 0842   BILITOT 0.7 09/12/2012 0842       Him alone A1c is 7.2    Assessment:  Onychomycosis of left great toe - Plan: terbinafine (LAMISIL) 250 MG tablet  ASHD (arteriosclerotic heart disease)  Diabetes mellitus type 2, controlled - Plan: POCT glycosylated hemoglobin (Hb A1C), CBC with Differential, Comprehensive metabolic panel, Lipid panel, POCT UA - Microalbumin  Diabetic polyneuropathy associated with type 2 diabetes mellitus  DM type 2 with diabetic dyslipidemia - Plan: Lipid panel  Gout without tophus, unspecified cause, unspecified chronicity, unspecified site  Hypertension complicating diabetes        Plan:    1.  Rx changes: none 2.  Education: Reviewed 'ABCs' of diabetes management (respective goals in parentheses):  A1C (<7), blood pressure (<130/80), and cholesterol (LDL <100). 3.  Compliance at present is estimated to be fair. Efforts to improve compliance (if necessary) will be directed at increased exercise. 4. Follow up: 4 months   Duke Salvia him to become more active. He will also be referred for possible inclusion in a research protocol for his diabetes.

## 2014-01-10 ENCOUNTER — Encounter (HOSPITAL_COMMUNITY): Payer: Self-pay | Admitting: Cardiovascular Disease

## 2014-01-31 ENCOUNTER — Encounter: Payer: Self-pay | Admitting: Family Medicine

## 2014-02-03 ENCOUNTER — Other Ambulatory Visit: Payer: Self-pay | Admitting: Family Medicine

## 2014-02-13 ENCOUNTER — Encounter: Payer: Self-pay | Admitting: Cardiovascular Disease

## 2014-02-13 ENCOUNTER — Ambulatory Visit (INDEPENDENT_AMBULATORY_CARE_PROVIDER_SITE_OTHER): Payer: Medicare Other | Admitting: Cardiovascular Disease

## 2014-02-13 VITALS — BP 132/62 | HR 66 | Ht 70.0 in | Wt 243.0 lb

## 2014-02-13 DIAGNOSIS — I2510 Atherosclerotic heart disease of native coronary artery without angina pectoris: Secondary | ICD-10-CM

## 2014-02-13 DIAGNOSIS — I779 Disorder of arteries and arterioles, unspecified: Secondary | ICD-10-CM

## 2014-02-13 DIAGNOSIS — I10 Essential (primary) hypertension: Secondary | ICD-10-CM | POA: Diagnosis not present

## 2014-02-13 DIAGNOSIS — I739 Peripheral vascular disease, unspecified: Secondary | ICD-10-CM

## 2014-02-13 DIAGNOSIS — E785 Hyperlipidemia, unspecified: Secondary | ICD-10-CM

## 2014-02-13 NOTE — Progress Notes (Signed)
  History of Present Illness: 74 yo WM with history of CAD with stent RCA, DM, bilateral carotid artery disease, HTN, HLD here today for cardiology follow up. His echo in 2009 showed mild LVH and normal LV function with no significant valvular abnormalities. His cath iin 2009 showed a near total occlusion of the RCA beyond the stent in the mid portion. I attempted PCI and was able to balloon the mid lesion but could never pass a balloon distally. He did well after that.  I saw him December 2013 and he c/o chest tightness with minimal exertion associated with SOB. He has noticed his heart pounding but it has not been racing. I arranged a left heart cath on 01/21/12. He was found to have a total occlusion of the RCA which was not amenable to PCI (PCI attempted in 2009). The LAD and Circumflex had moderate disease. I doubled his Imdur to 30 mg po BID. He has done well since then. Normal ABI 10/01/13. Carotid artery dopplers July 2015 with bilateral 60-79% stenosis.   He is here today for planned follow up. Denies chest pain or SOB. He has started back smoking. He has had two episodes of palpitations over the last 6 months at night while in bed. Heart was racing. Only lasted for several minutes.   Primary Care Physician: John Lalonde   Last Lipid Profile:Lipid Panel     Component Value Date/Time   CHOL 129 01/02/2014 0947   TRIG 187* 01/02/2014 0947   HDL 37* 01/02/2014 0947   CHOLHDL 3.5 01/02/2014 0947   VLDL 37 01/02/2014 0947   LDLCALC 55 01/02/2014 0947     Past Medical History  Diagnosis Date  . Diabetes mellitus     with neuropathy  . Hypertension   . AAA (abdominal aortic aneurysm)   . Gout   . Vitamin B12 deficiency   . Hiatal hernia   . CAD (coronary artery disease)     with cath 09/26/08 showing severe heart disease in RCA with POBA, mild to mod diesease in circ and LAD  . Chronic back pain     back problmes. prior to surgery   . Hyperlipidemia     Past Surgical History    Procedure Laterality Date  . Back surgery      x3  . Left ankle surgery    . Carpal tunnel release      L arm   . 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 Left heart catheterization with coronary angiogram N/A 01/21/2012    Procedure: LEFT HEART CATHETERIZATION WITH CORONARY ANGIOGRAM;  Surgeon:  D , MD;  Location: [facility] CATH LAB;  Service: Cardiovascular;  Laterality: N/A;    Current Outpatient Prescriptions  Medication Sig Dispense Refill  . ACCU-CHEK AVIVA PLUS test strip Test once to twice daily 200 each 1  . allopurinol (ZYLOPRIM) 300 MG tablet Take 300 mg by mouth daily.    . aspirin 81 MG chewable tablet Chew 81 mg by mouth daily.    . Blood Glucose Monitoring Suppl (ACCU-CHEK AVIVA PLUS) W/DEVICE KIT 1 kit by Does not apply route daily. USE AS DIRECTED 1 kit 0  . clobetasol cream (TEMOVATE) 0.05 % Apply 1 application topically 2 (two) times daily. 45 g 1  . clopidogrel (PLAVIX) 75 MG tablet Take 1 tablet by mouth  daily 90 tablet 1  . fish oil-omega-3 fatty acids 1000 MG capsule Take 2 g by mouth daily. By mouth    .   isosorbide mononitrate (IMDUR) 30 MG 24 hr tablet Take 1 tablet by mouth  twice a day 60 tablet 6  . Lancets MISC PT IS TO TEST 1 TO 2 TIMES A DAY 300 each PRN  . metFORMIN (GLUCOPHAGE-XR) 500 MG 24 hr tablet Take 2 tablets daily 14 tablet 0  . metoprolol tartrate (LOPRESSOR) 25 MG tablet Take 1 tablet by mouth two  times daily 90 tablet 0  . nitroGLYCERIN (NITROSTAT) 0.4 MG SL tablet Place 0.4 mg under the tongue every 5 (five) minutes as needed. For chest pain    . quinapril (ACCUPRIL) 20 MG tablet Take 1 tablet by mouth  twice a day 180 tablet 1  . simvastatin (ZOCOR) 80 MG tablet Take 1 tablet by mouth at  bedtime 90 tablet 1  . terbinafine (LAMISIL) 250 MG tablet Take 1 tablet (250 mg total) by mouth daily. 90 tablet 1  . colchicine 0.6 MG tablet Take 0.6 mg by mouth as needed (by mouth).      No current facility-administered medications for this  visit.    No Known Allergies  History   Social History  . Marital Status: Single    Spouse Name: N/A    Number of Children: N/A  . Years of Education: N/A   Occupational History  . Not on file.   Social History Main Topics  . Smoking status: Former Smoker    Types: Cigars  . Smokeless tobacco: Never Used     Comment: quit smoking cigars right before christmas 2014!  . Alcohol Use: 0.6 oz/week    1 Glasses of wine per week  . Drug Use: No  . Sexual Activity: Yes   Other Topics Concern  . Not on file   Social History Narrative   Divorced, 1 child.    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    Owned roofing co. In [place]    Family History  Problem Relation Age of Onset  . Heart attack Mother   . Cancer Mother   . Heart attack Sister     Review of Systems:  As stated in the HPI and otherwise negative.   BP 132/62 mmHg  Pulse 66  Ht 5' 10" (1.778 m)  Wt 243 lb (110.224 kg)  BMI 34.87 kg/m2  SpO2 97%  Physical Examination: General: Well developed, well nourished, NAD HEENT: OP clear, mucus membranes moist SKIN: warm, dry. No rashes. Neuro: No focal deficits Musculoskeletal: Muscle strength 5/5 all ext Psychiatric: Mood and affect normal Neck: No JVD, no carotid bruits, no thyromegaly, no lymphadenopathy. Lungs:Clear bilaterally, no wheezes, rhonci, crackles Cardiovascular: Regular rate and rhythm. No murmurs, gallops or rubs. Abdomen:Soft. Bowel sounds present. Non-tender.  Extremities: Mild bilateral lower extremity edema. Pulses are 2 + in the bilateral DP/PT.  Carotid artery disease: 08/14/13: Bilateral 60-79% stenosis.   Assessment and Plan:   1. CAD: Stable angina. His RCA is occluded distally, not amenable to PCI. The LAD and Circumflex have moderate disease with no lesions that appear to be flow limiting by cath December 2013. Continue current meds.  2. HTN: BP well controlled. No changes today.   3. Carotid artery  disease: He has moderate bilateral disease which is stable by dopplers July 2015.    4. Hyperlipidemia: Lipids well controlled. Continue statin.  

## 2014-02-13 NOTE — Patient Instructions (Signed)
Your physician wants you to follow-up in:  6 months. You will receive a reminder letter in the mail two months in advance. If you don't receive a letter, please call our office to schedule the follow-up appointment.  Your physician has requested that you have a carotid duplex. This test is an ultrasound of the carotid arteries in your neck. It looks at blood flow through these arteries that supply the brain with blood. Allow one hour for this exam. There are no restrictions or special instructions. To be done in July 2016 on day of appt with Dr. Angelena Form

## 2014-03-07 ENCOUNTER — Other Ambulatory Visit: Payer: Self-pay | Admitting: Family Medicine

## 2014-03-07 ENCOUNTER — Other Ambulatory Visit: Payer: Self-pay | Admitting: Cardiovascular Disease

## 2014-03-19 ENCOUNTER — Encounter: Payer: Self-pay | Admitting: Family Medicine

## 2014-04-15 ENCOUNTER — Other Ambulatory Visit: Payer: Self-pay | Admitting: Family Medicine

## 2014-04-16 ENCOUNTER — Encounter: Payer: Self-pay | Admitting: Family Medicine

## 2014-05-11 ENCOUNTER — Other Ambulatory Visit: Payer: Self-pay | Admitting: Family Medicine

## 2014-07-05 ENCOUNTER — Encounter: Payer: Self-pay | Admitting: Family Medicine

## 2014-07-30 ENCOUNTER — Encounter: Payer: Self-pay | Admitting: Family Medicine

## 2014-08-03 ENCOUNTER — Other Ambulatory Visit: Payer: Self-pay | Admitting: Family Medicine

## 2014-08-03 ENCOUNTER — Other Ambulatory Visit: Payer: Self-pay | Admitting: Cardiovascular Disease

## 2014-08-07 ENCOUNTER — Other Ambulatory Visit: Payer: Self-pay | Admitting: Cardiovascular Disease

## 2014-08-07 DIAGNOSIS — I6523 Occlusion and stenosis of bilateral carotid arteries: Secondary | ICD-10-CM

## 2014-08-14 NOTE — Progress Notes (Signed)
Chief Complaint  Patient presents with  . Follow-up      History of Present Illness: 74 yo WM with history of CAD with stent RCA, DM, bilateral carotid artery disease, HTN, HLD here today for cardiology follow up. His echo in 2009 showed mild LVH and normal LV function with no significant valvular abnormalities. His cath iin 2009 showed a near total occlusion of the RCA beyond the stent in the mid portion. I attempted PCI and was able to balloon the mid lesion but could never pass a balloon distally. He did well after that.  I saw him December 2013 and he c/o chest tightness with minimal exertion associated with SOB. He has noticed his heart pounding but it has not been racing. I arranged a left heart cath on 01/21/12. He was found to have a total occlusion of the RCA which was not amenable to PCI (PCI attempted in 2009). The LAD and Circumflex had moderate disease. I doubled his Imdur to 30 mg po BID. He has done well since then. Normal ABI 10/01/13. Carotid artery dopplers July 2015 with bilateral 60-79% stenosis.   He is here today for planned follow up. Denies chest pain or SOB. He has started back smoking.Overall feeling well.   Primary Care Physician: Jill Alexanders   Last Lipid Profile:Lipid Panel     Component Value Date/Time   CHOL 129 01/02/2014 0947   TRIG 187* 01/02/2014 0947   HDL 37* 01/02/2014 0947   CHOLHDL 3.5 01/02/2014 0947   VLDL 37 01/02/2014 0947   LDLCALC 55 01/02/2014 0947     Past Medical History  Diagnosis Date  . Diabetes mellitus     with neuropathy  . Hypertension   . AAA (abdominal aortic aneurysm)   . Gout   . Vitamin B12 deficiency   . Hiatal hernia   . CAD (coronary artery disease)     with cath 09/26/08 showing severe heart disease in RCA with POBA, mild to mod diesease in circ and LAD  . Chronic back pain     back problmes. prior to surgery   . Hyperlipidemia     Past Surgical History  Procedure Laterality Date  . Back surgery      x3  .  Left ankle surgery    . Carpal tunnel release      L arm   . 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 Left heart catheterization with coronary angiogram N/A 01/21/2012    Procedure: LEFT HEART CATHETERIZATION WITH CORONARY ANGIOGRAM;  Surgeon: Burnell Blanks, MD;  Location: [facility] CATH LAB;  Service: Cardiovascular;  Laterality: N/A;    Current Outpatient Prescriptions  Medication Sig Dispense Refill  . ACCU-CHEK AVIVA PLUS test strip Test once to twice daily 200 each 1  . allopurinol (ZYLOPRIM) 300 MG tablet Take 300 mg by mouth daily.    Marland Kitchen aspirin 81 MG chewable tablet Chew 81 mg by mouth daily.    . Blood Glucose Monitoring Suppl (ACCU-CHEK AVIVA PLUS) W/DEVICE KIT 1 kit by Does not apply route daily. USE AS DIRECTED 1 kit 0  . clobetasol cream (TEMOVATE) 5.02 % Apply 1 application topically 2 (two) times daily. 45 g 1  . clopidogrel (PLAVIX) 75 MG tablet Take 1 tablet by mouth  daily 90 tablet 0  . fish oil-omega-3 fatty acids 1000 MG capsule Take 2 g by mouth daily. By mouth    . isosorbide mononitrate (IMDUR) 30 MG 24 hr tablet  Take 1 tablet by mouth  twice a day 60 tablet 6  . Lancets MISC PT IS TO TEST 1 TO 2 TIMES A DAY 300 each PRN  . metFORMIN (GLUCOPHAGE-XR) 500 MG 24 hr tablet Take 2 tablets by mouth  daily 180 tablet 1  . metoprolol tartrate (LOPRESSOR) 25 MG tablet Take 1 tablet by mouth two  times daily 180 tablet 1  . nitroGLYCERIN (NITROSTAT) 0.4 MG SL tablet Place 0.4 mg under the tongue every 5 (five) minutes as needed. For chest pain    . quinapril (ACCUPRIL) 20 MG tablet Take 1 tablet by mouth  twice a day 180 tablet 0  . simvastatin (ZOCOR) 80 MG tablet Take 1 tablet by mouth at  bedtime 90 tablet 0  . terbinafine (LAMISIL) 250 MG tablet Take 1 tablet (250 mg total) by mouth daily. 90 tablet 1  . colchicine 0.6 MG tablet Take 0.6 mg by mouth as needed (patient take 0.6 mg tablet by mouth once daily as needed for gout).      No current facility-administered  medications for this visit.    No Known Allergies  History   Social History  . Marital Status: Single    Spouse Name: N/A  . Number of Children: N/A  . Years of Education: N/A   Occupational History  . Not on file.   Social History Main Topics  . Smoking status: Former Smoker    Types: Cigars  . Smokeless tobacco: Never Used     Comment: quit smoking cigars right before christmas 2014!  Marland Kitchen Alcohol Use: 0.6 oz/week    1 Glasses of wine per week  . Drug Use: No  . Sexual Activity: Yes   Other Topics Concern  . Not on file   Social History Narrative   Divorced, 1 child.    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 Heart attack Mother   . Cancer Mother   . Heart attack Sister     Review of Systems:  As stated in the HPI and otherwise negative.   BP 128/42 mmHg  Pulse 54  Ht 5' 10.5" (1.791 m)  Wt 236 lb 12.8 oz (107.412 kg)  BMI 33.49 kg/m2  Physical Examination: General: Well developed, well nourished, NAD HEENT: OP clear, mucus membranes moist SKIN: warm, dry. No rashes. Neuro: No focal deficits Musculoskeletal: Muscle strength 5/5 all ext Psychiatric: Mood and affect normal Neck: No JVD, no carotid bruits, no thyromegaly, no lymphadenopathy. Lungs:Clear bilaterally, no wheezes, rhonci, crackles Cardiovascular: Regular rate and rhythm. No murmurs, gallops or rubs. Abdomen:Soft. Bowel sounds present. Non-tender.  Extremities: Mild bilateral lower extremity edema. Pulses are 2 + in the bilateral DP/PT.  Carotid artery disease: 08/14/13: Bilateral 60-79% stenosis.   EKG:  EKG is ordered today. The ekg ordered today demonstrates sinus brady, rate 54 bpm  Recent Labs: 01/02/2014: ALT 18; BUN 23; Creat 0.95; Hemoglobin 12.8*; Platelets 125*; Potassium 4.4; Sodium 136   Lipid Panel    Component Value Date/Time   CHOL 129 01/02/2014 0947   TRIG 187*  01/02/2014 0947   HDL 37* 01/02/2014 0947   CHOLHDL 3.5 01/02/2014 0947   VLDL 37 01/02/2014 0947   LDLCALC 55 01/02/2014 0947     Wt Readings from Last 3 Encounters:  08/15/14 236 lb 12.8 oz (107.412 kg)  02/13/14 243 lb (110.224 kg)  01/02/14  240 lb (108.863 kg)     Other studies Reviewed: Additional studies/ records that were reviewed today include: . Review of the above records demonstrates:    Assessment and Plan:   1. CAD: Stable angina. His RCA is occluded distally, not amenable to PCI. The LAD and Circumflex have moderate disease with no lesions that appear to be flow limiting by cath December 2013. Continue current meds.  2. HTN: BP well controlled. No changes today.   3. Carotid artery disease: He has moderate bilateral disease which is stable by dopplers today.  4. Hyperlipidemia: Lipids well controlled. Continue statin.   Current medicines are reviewed at length with the patient today.  The patient does not have concerns regarding medicines.  The following changes have been made:  no change  Labs/ tests ordered today include:   Orders Placed This Encounter  Procedures  . EKG 12-Lead    Disposition:   FU with me in 6 months  Signed, Lauree Chandler, MD 08/15/2014 4:17 PM    [facility] [address], [place], [place]  [address] Phone: [phone]; Fax: [phone]

## 2014-08-15 ENCOUNTER — Ambulatory Visit (INDEPENDENT_AMBULATORY_CARE_PROVIDER_SITE_OTHER): Payer: Medicare Other | Admitting: Cardiovascular Disease

## 2014-08-15 ENCOUNTER — Encounter: Payer: Self-pay | Admitting: Cardiovascular Disease

## 2014-08-15 ENCOUNTER — Ambulatory Visit (HOSPITAL_COMMUNITY): Payer: Medicare Other | Attending: Internal Medicine

## 2014-08-15 VITALS — BP 128/42 | HR 54 | Ht 70.5 in | Wt 236.8 lb

## 2014-08-15 DIAGNOSIS — I10 Essential (primary) hypertension: Secondary | ICD-10-CM

## 2014-08-15 DIAGNOSIS — I739 Peripheral vascular disease, unspecified: Secondary | ICD-10-CM

## 2014-08-15 DIAGNOSIS — E785 Hyperlipidemia, unspecified: Secondary | ICD-10-CM | POA: Diagnosis not present

## 2014-08-15 DIAGNOSIS — I779 Disorder of arteries and arterioles, unspecified: Secondary | ICD-10-CM | POA: Diagnosis not present

## 2014-08-15 DIAGNOSIS — I2510 Atherosclerotic heart disease of native coronary artery without angina pectoris: Secondary | ICD-10-CM

## 2014-08-15 DIAGNOSIS — I6523 Occlusion and stenosis of bilateral carotid arteries: Secondary | ICD-10-CM | POA: Diagnosis not present

## 2014-08-15 NOTE — Patient Instructions (Signed)
Medication Instructions:  Your physician recommends that you continue on your current medications as directed. Please refer to the Current Medication list given to you today.   Labwork: none  Testing/Procedures: none  Follow-Up: Your physician wants you to follow-up in: 6 months.  You will receive a reminder letter in the mail two months in advance. If you don't receive a letter, please call our office to schedule the follow-up appointment.       

## 2014-08-21 DIAGNOSIS — H2513 Age-related nuclear cataract, bilateral: Secondary | ICD-10-CM | POA: Diagnosis not present

## 2014-08-21 DIAGNOSIS — E119 Type 2 diabetes mellitus without complications: Secondary | ICD-10-CM | POA: Diagnosis not present

## 2014-08-21 DIAGNOSIS — H04123 Dry eye syndrome of bilateral lacrimal glands: Secondary | ICD-10-CM | POA: Diagnosis not present

## 2014-08-21 LAB — HM DIABETES EYE EXAM

## 2014-08-27 ENCOUNTER — Encounter: Payer: Self-pay | Admitting: Internal Medicine

## 2014-09-21 ENCOUNTER — Other Ambulatory Visit: Payer: Self-pay | Admitting: Family Medicine

## 2014-09-23 NOTE — Telephone Encounter (Signed)
Is this okay last ov was 12/15

## 2014-09-23 NOTE — Telephone Encounter (Signed)
Needs an appointment.

## 2014-09-23 NOTE — Telephone Encounter (Signed)
Is this okay last ov 12/15

## 2014-10-04 ENCOUNTER — Other Ambulatory Visit: Payer: Self-pay | Admitting: Family Medicine

## 2014-10-22 ENCOUNTER — Other Ambulatory Visit: Payer: Self-pay | Admitting: Family Medicine

## 2014-10-22 NOTE — Telephone Encounter (Signed)
OK to RF all? He has appointment with you on the 29th.

## 2014-10-31 ENCOUNTER — Encounter: Payer: Self-pay | Admitting: Family Medicine

## 2014-10-31 ENCOUNTER — Ambulatory Visit (INDEPENDENT_AMBULATORY_CARE_PROVIDER_SITE_OTHER): Payer: Medicare Other | Admitting: Family Medicine

## 2014-10-31 VITALS — BP 118/58 | HR 64 | Wt 226.0 lb

## 2014-10-31 DIAGNOSIS — M549 Dorsalgia, unspecified: Secondary | ICD-10-CM

## 2014-10-31 DIAGNOSIS — Z23 Encounter for immunization: Secondary | ICD-10-CM | POA: Diagnosis not present

## 2014-10-31 DIAGNOSIS — M109 Gout, unspecified: Secondary | ICD-10-CM | POA: Diagnosis not present

## 2014-10-31 DIAGNOSIS — I714 Abdominal aortic aneurysm, without rupture, unspecified: Secondary | ICD-10-CM

## 2014-10-31 DIAGNOSIS — E669 Obesity, unspecified: Secondary | ICD-10-CM

## 2014-10-31 DIAGNOSIS — E785 Hyperlipidemia, unspecified: Secondary | ICD-10-CM | POA: Diagnosis not present

## 2014-10-31 DIAGNOSIS — I10 Essential (primary) hypertension: Secondary | ICD-10-CM

## 2014-10-31 DIAGNOSIS — I2510 Atherosclerotic heart disease of native coronary artery without angina pectoris: Secondary | ICD-10-CM | POA: Diagnosis not present

## 2014-10-31 DIAGNOSIS — E1169 Type 2 diabetes mellitus with other specified complication: Secondary | ICD-10-CM | POA: Diagnosis not present

## 2014-10-31 DIAGNOSIS — E1142 Type 2 diabetes mellitus with diabetic polyneuropathy: Secondary | ICD-10-CM | POA: Diagnosis not present

## 2014-10-31 DIAGNOSIS — E119 Type 2 diabetes mellitus without complications: Secondary | ICD-10-CM

## 2014-10-31 DIAGNOSIS — G8929 Other chronic pain: Secondary | ICD-10-CM

## 2014-10-31 DIAGNOSIS — E1159 Type 2 diabetes mellitus with other circulatory complications: Secondary | ICD-10-CM

## 2014-10-31 DIAGNOSIS — I152 Hypertension secondary to endocrine disorders: Secondary | ICD-10-CM

## 2014-10-31 LAB — URIC ACID: Uric Acid, Serum: 6 mg/dL (ref 4.0–7.8)

## 2014-10-31 LAB — POCT GLYCOSYLATED HEMOGLOBIN (HGB A1C): Hemoglobin A1C: 6.8

## 2014-10-31 NOTE — Progress Notes (Signed)
   Subjective:    Patient ID: Paul [name], male    DOB: 15-Sep-1940, 74 y.o.   MRN: [id number]  HPI  he is here for a diabetes follow-up. He has made some dietary changes and has lost some weight. He has been unable to exercise like he would like due to chronic back pain. He dates this to a remote back injury and subsequent surgeries. He states that any mono walking at all causes pain. He did follow-up with his cardiologist approximately one month ago. His carotid was also checked at that time. He sees Dr. Katy Fitch for his eyes. He does check his feet regularly. He has had difficulty with gout symptoms. He has been involved in a research protocol but would like his uric acid checked. Do the record indicates he does have AAA and his last evaluation was 2013. He does not smoke. His medical issues were reviewed.   Review of Systems     Objective:   Physical Exam  back exam shows evidence of scarring. No motion noted in the lumbar area. Negative straight leg raising. Normal hip motion. Reflexes were diminished.  HbA1c 6.8.       Assessment & Plan:  Need for prophylactic vaccination and inoculation against influenza - Plan: Flu vaccine HIGH DOSE PF (Fluzone High dose)  ASHD (arteriosclerotic heart disease)  Diabetes mellitus type 2, controlled - Plan: POCT glycosylated hemoglobin (Hb A1C)  Diabetic polyneuropathy associated with type 2 diabetes mellitus  DM type 2 with diabetic dyslipidemia  Gout without tophus, unspecified cause, unspecified chronicity, unspecified site - Plan: Uric Acid, CANCELED: Uric Acid  AAA (abdominal aortic aneurysm) without rupture - Plan: US Aorta  Obesity  Hypertension complicating diabetes  Chronic back pain - Plan: DG Lumbar Spine Complete  discussed proper care for his back and will initially have him get involved in physical therapy as well as xray to see status of his bones.  I explained that at this point , any surgeon would do exactly the same and  try to avoid any surgery especially with his previous history of surgery. Over 45 minutes, greater than 50% spent in counseling and coordination of care.

## 2014-11-07 ENCOUNTER — Ambulatory Visit
Admission: RE | Admit: 2014-11-07 | Discharge: 2014-11-07 | Disposition: A | Discharge status: Home / Self Care | Payer: Medicare Other | Source: Ambulatory Visit | Attending: Family Medicine | Admitting: Family Medicine

## 2014-11-07 DIAGNOSIS — G8929 Other chronic pain: Secondary | ICD-10-CM

## 2014-11-07 DIAGNOSIS — M8448XA Pathological fracture, other site, initial encounter for fracture: Secondary | ICD-10-CM | POA: Diagnosis not present

## 2014-11-07 DIAGNOSIS — M549 Dorsalgia, unspecified: Principal | ICD-10-CM

## 2014-11-07 DIAGNOSIS — I714 Abdominal aortic aneurysm, without rupture, unspecified: Secondary | ICD-10-CM

## 2014-11-14 ENCOUNTER — Telehealth: Payer: Self-pay

## 2014-11-14 ENCOUNTER — Other Ambulatory Visit: Payer: Self-pay | Admitting: Medical

## 2014-11-14 NOTE — Telephone Encounter (Signed)
Dr. Redmond School is on vacation this week.   If Mr. Paul [name] has enough to last til Monday, then send to Dr. Redmond School.  I don't see we've sent this in for that quantity before, and most people aren't on this BID every day.  So I want to be sure we send the right thing.

## 2014-11-14 NOTE — Telephone Encounter (Signed)
He misinterpreted what him and Dr. Redmond School spoke about last visit and thought he meant take two a day. The rx says only one a day. Pt says he has 8 pills. Is this ok to refill?

## 2014-11-14 NOTE — Telephone Encounter (Signed)
Refill request for Colchicine 0.6mg  b.i.d for 90 days (Paul [name])

## 2014-11-17 NOTE — Telephone Encounter (Signed)
He should not need the colchicine on a daily basis so have him stop and let me know how he does

## 2014-11-18 NOTE — Telephone Encounter (Signed)
If he has what he thinks is an attack, have him come in for an appointment so we can verify this

## 2014-11-18 NOTE — Telephone Encounter (Signed)
PT INFORMED AND WANTS TO KNOW WHAT TO DO WHEN HE HAS THE GOUT HE ONLY HAS 3 PILLS LEFT

## 2014-12-06 ENCOUNTER — Encounter: Payer: Self-pay | Admitting: Family Medicine

## 2014-12-06 ENCOUNTER — Ambulatory Visit (INDEPENDENT_AMBULATORY_CARE_PROVIDER_SITE_OTHER): Payer: Medicare Other | Admitting: Family Medicine

## 2014-12-06 VITALS — BP 120/70 | HR 70 | Ht 70.5 in | Wt 229.8 lb

## 2014-12-06 DIAGNOSIS — M109 Gout, unspecified: Secondary | ICD-10-CM

## 2014-12-06 DIAGNOSIS — M10041 Idiopathic gout, right hand: Secondary | ICD-10-CM

## 2014-12-06 MED ORDER — COLCHICINE 0.6 MG PO TABS: 0.6000 mg | ORAL_TABLET | Freq: Two times a day (BID) | ORAL | Status: DC

## 2014-12-06 NOTE — Progress Notes (Signed)
   Subjective:    Patient ID: Paul [name], male    DOB: [birth year], 74 y.o.   MRN: [id number]  HPI He is here for consult concerning gout. He states that yesterday he had the onset of right knee pain and swelling as well as pain in the left hip and swelling of the right third finger. Apparently he is involved in a gout study by a physician in [place]. He apparently did have gout attacks while on the study and expresses a desire to be off allopurinol stating he thinks is causing it. He was apparently getting colchicine but using it more often then recommended he then ran out and called for a refill from me. I did not give it and he is here for follow-up concerning this.   Review of Systems     Objective:   Physical Exam Alert and in no distress. Blood work from September did show a uric acid of 6.0. Exam of both knees shows that they're not hot red or tender. He does have some swelling of the right third PIP joint but is not red and only minimally tender.       Assessment & Plan:  Gout of right hand, unspecified cause, unspecified chronicity - Plan: colchicine 0.6 MG tablet  I will give him colchicine to be taken twice per day as this does seem to work. Strongly encouraged him to get back in touch with the research team and let them know what he has been doing. Discussed the fact that while the allopurinol is being titrated upwards, this can cause an attack however once it is stable in the uric acid is below 6, he should not have any more difficulty. Explained the fact that long-term use of colchicine is not the best way to handle control of gout.

## 2014-12-24 ENCOUNTER — Other Ambulatory Visit: Payer: Self-pay | Admitting: Family Medicine

## 2014-12-31 ENCOUNTER — Other Ambulatory Visit: Payer: Self-pay | Admitting: Cardiovascular Disease

## 2015-03-03 ENCOUNTER — Ambulatory Visit (INDEPENDENT_AMBULATORY_CARE_PROVIDER_SITE_OTHER): Payer: Medicare Other | Admitting: Family Medicine

## 2015-03-03 ENCOUNTER — Encounter: Payer: Self-pay | Admitting: Family Medicine

## 2015-03-03 VITALS — BP 118/68 | HR 57 | Wt 222.1 lb

## 2015-03-03 DIAGNOSIS — I10 Essential (primary) hypertension: Secondary | ICD-10-CM | POA: Diagnosis not present

## 2015-03-03 DIAGNOSIS — I714 Abdominal aortic aneurysm, without rupture, unspecified: Secondary | ICD-10-CM

## 2015-03-03 DIAGNOSIS — I152 Hypertension secondary to endocrine disorders: Secondary | ICD-10-CM

## 2015-03-03 DIAGNOSIS — I2510 Atherosclerotic heart disease of native coronary artery without angina pectoris: Secondary | ICD-10-CM | POA: Diagnosis not present

## 2015-03-03 DIAGNOSIS — M10041 Idiopathic gout, right hand: Secondary | ICD-10-CM

## 2015-03-03 DIAGNOSIS — E1142 Type 2 diabetes mellitus with diabetic polyneuropathy: Secondary | ICD-10-CM

## 2015-03-03 DIAGNOSIS — E1140 Type 2 diabetes mellitus with diabetic neuropathy, unspecified: Secondary | ICD-10-CM

## 2015-03-03 DIAGNOSIS — Z789 Other specified health status: Secondary | ICD-10-CM

## 2015-03-03 DIAGNOSIS — E119 Type 2 diabetes mellitus without complications: Secondary | ICD-10-CM

## 2015-03-03 DIAGNOSIS — E785 Hyperlipidemia, unspecified: Secondary | ICD-10-CM

## 2015-03-03 DIAGNOSIS — E1159 Type 2 diabetes mellitus with other circulatory complications: Secondary | ICD-10-CM | POA: Diagnosis not present

## 2015-03-03 DIAGNOSIS — M1000 Idiopathic gout, unspecified site: Secondary | ICD-10-CM | POA: Diagnosis not present

## 2015-03-03 DIAGNOSIS — M109 Gout, unspecified: Secondary | ICD-10-CM

## 2015-03-03 DIAGNOSIS — E1169 Type 2 diabetes mellitus with other specified complication: Secondary | ICD-10-CM | POA: Diagnosis not present

## 2015-03-03 DIAGNOSIS — E669 Obesity, unspecified: Secondary | ICD-10-CM

## 2015-03-03 LAB — CBC WITH DIFFERENTIAL/PLATELET
Basophils Absolute: 0 10*3/uL (ref 0.0–0.1)
Basophils Relative: 0 % (ref 0–1)
Eosinophils Absolute: 0.2 10*3/uL (ref 0.0–0.7)
Eosinophils Relative: 3 % (ref 0–5)
HCT: 32 % — ABNORMAL LOW (ref 39.0–52.0)
Hemoglobin: 10.8 g/dL — ABNORMAL LOW (ref 13.0–17.0)
Lymphocytes Relative: 26 % (ref 12–46)
Lymphs Abs: 2 10*3/uL (ref 0.7–4.0)
MCH: 31.5 pg (ref 26.0–34.0)
MCHC: 33.8 g/dL (ref 30.0–36.0)
MCV: 93.3 fL (ref 78.0–100.0)
MPV: 10.2 fL (ref 8.6–12.4)
Monocytes Absolute: 0.5 10*3/uL (ref 0.1–1.0)
Monocytes Relative: 7 % (ref 3–12)
Neutro Abs: 4.9 10*3/uL (ref 1.7–7.7)
Neutrophils Relative %: 64 % (ref 43–77)
Platelets: 199 10*3/uL (ref 150–400)
RBC: 3.43 MIL/uL — ABNORMAL LOW (ref 4.22–5.81)
RDW: 16.4 % — ABNORMAL HIGH (ref 11.5–15.5)
WBC: 7.6 10*3/uL (ref 4.0–10.5)

## 2015-03-03 LAB — LIPID PANEL
Cholesterol: 107 mg/dL — ABNORMAL LOW (ref 125–200)
HDL: 30 mg/dL — ABNORMAL LOW (ref >=40)
LDL Cholesterol: 50 mg/dL (ref <130)
Total CHOL/HDL Ratio: 3.6 Ratio (ref <=5.0)
Triglycerides: 133 mg/dL (ref <150)
VLDL: 27 mg/dL (ref <30)

## 2015-03-03 LAB — COMPREHENSIVE METABOLIC PANEL
ALT: 18 U/L (ref 9–46)
AST: 17 U/L (ref 10–35)
Albumin: 3.5 g/dL — ABNORMAL LOW (ref 3.6–5.1)
Alkaline Phosphatase: 53 U/L (ref 40–115)
BUN: 18 mg/dL (ref 7–25)
CO2: 26 mmol/L (ref 20–31)
Calcium: 9.3 mg/dL (ref 8.6–10.3)
Chloride: 106 mmol/L (ref 98–110)
Creat: 0.88 mg/dL (ref 0.70–1.18)
Glucose, Bld: 133 mg/dL — ABNORMAL HIGH (ref 65–99)
Potassium: 4.9 mmol/L (ref 3.5–5.3)
Sodium: 138 mmol/L (ref 135–146)
Total Bilirubin: 0.6 mg/dL (ref 0.2–1.2)
Total Protein: 6.2 g/dL (ref 6.1–8.1)

## 2015-03-03 LAB — URIC ACID: Uric Acid, Serum: 5.6 mg/dL (ref 4.0–7.8)

## 2015-03-03 NOTE — Progress Notes (Signed)
Patient ID: Paul [name], male   DOB: [birth year], 75 y.o.   MRN: [id number]  Subjective:    Patient ID: Paul [name], male    DOB: [birth year], 75 y.o.   MRN: [id number]  Paul [name] is a 75 y.o. male who presents for follow-up of Type 2 diabetes mellitus.Also has AAA and will need follow-up in slightly less than 2 years he's had no chest pain, shortness of breath, PND or DOE. He is involved in a gout study and does use colchicine on an as-needed basis.  Patient is checking home blood sugars.   Home blood sugar records: BGs consistently in an acceptable range How often is blood sugars being checked: Daily Current symptoms/problems include paresthesia of the feet and have been unchanged. Daily foot checks YES  Any foot concerns NO Last eye exam:7-8 months ago Exercise: No The following portions of the patient's history were reviewed and updated as appropriate: allergies, current medications, past medical history, past social history and problem list.  ROS as in subjective above.     Objective:    Physical Exam Alert and in no distress otherwise not examined.  Blood pressure 118/68, pulse 57, weight 222 lb 2 oz (100.755 kg), SpO2 96 %.  Lab Review Diabetic Labs Latest Ref Rng 10/31/2014 01/02/2014 06/14/2013 01/12/2013 09/12/2012  HbA1c - 6.8% 7.2 7.3% 7.0 7.4  Chol 0 - 200 mg/dL - 129 - - 130  HDL >39 mg/dL - 37(L) - - 35(L)  Calc LDL 0 - 99 mg/dL - 55 - - 63  Triglycerides <150 mg/dL - 187(H) - - 162(H)  Creatinine 0.50 - 1.35 mg/dL - 0.95 - - 0.99  GFR >60.00 mL/min - - - - -   BP/Weight 03/03/2015 12/06/2014 10/31/2014 08/15/2014 [id number]  Systolic BP 123456 123456 123456 [id number]  Diastolic BP 68 70 58 42 62  Wt. (Lbs) 222.13 229.8 226 236.8 243  BMI 31.41 32.5 31.96 33.49 34.87   Foot/eye exam completion dates Latest Ref Rng 08/21/2014 09/27/2013  Eye Exam No Retinopathy No Retinopathy -  Foot Form Completion - - Done  A1C 6.7. Foot exam is recorded and does show decreased  sensation and great toe missing   Paul [name]  reports that he has quit smoking. His smoking use included Cigars. He has never used smokeless tobacco. He reports that he drinks about 0.6 oz of alcohol per week. He reports that he does not use illicit drugs.     Assessment & Plan:    AAA (abdominal aortic aneurysm) without rupture ([name])  ASHD (arteriosclerotic heart disease) - Plan: CBC with Differential/Platelet, Comprehensive metabolic panel, Lipid panel  Controlled type 2 diabetes mellitus with diabetic neuropathy, without long-term current use of insulin ([name]) - Plan: CBC with Differential/Platelet, Comprehensive metabolic panel, Lipid panel, POCT UA - Microalbumin  Diabetic polyneuropathy associated with type 2 diabetes mellitus ([place])  DM type 2 with diabetic dyslipidemia ([place])  Gout of right hand, unspecified cause, unspecified chronicity - Plan: Uric Acid  Hypertension complicating diabetes ([place])  Obesity  Not current smoker   1. Rx changes: none 2. Education: Reviewed 'ABCs' of diabetes management (respective goals in parentheses):  A1C (<7), blood pressure (<130/80), and cholesterol (LDL <100). 3. Compliance at present is estimated to be good. Efforts to improve compliance (if necessary) will be directed at no change. 4. Follow up: 4 months Encouraged him to check his blood sugars during the day either before a meal or 2 hours after a meal.

## 2015-03-04 NOTE — Addendum Note (Signed)
Addended by: Denita Lung on: 03/04/2015 07:52 AM   Modules accepted: Orders

## 2015-03-10 ENCOUNTER — Ambulatory Visit (INDEPENDENT_AMBULATORY_CARE_PROVIDER_SITE_OTHER): Payer: Medicare Other | Admitting: Cardiovascular Disease

## 2015-03-10 ENCOUNTER — Encounter: Payer: Self-pay | Admitting: Cardiovascular Disease

## 2015-03-10 VITALS — BP 110/58 | HR 68 | Ht 70.0 in | Wt 223.8 lb

## 2015-03-10 DIAGNOSIS — I2510 Atherosclerotic heart disease of native coronary artery without angina pectoris: Secondary | ICD-10-CM

## 2015-03-10 DIAGNOSIS — E785 Hyperlipidemia, unspecified: Secondary | ICD-10-CM

## 2015-03-10 DIAGNOSIS — I779 Disorder of arteries and arterioles, unspecified: Secondary | ICD-10-CM | POA: Diagnosis not present

## 2015-03-10 DIAGNOSIS — I10 Essential (primary) hypertension: Secondary | ICD-10-CM

## 2015-03-10 DIAGNOSIS — I739 Peripheral vascular disease, unspecified: Secondary | ICD-10-CM

## 2015-03-10 NOTE — Progress Notes (Signed)
Chief Complaint  Patient presents with  . Follow-up  . Coronary Artery Disease  . Hypertension     History of Present Illness: 75 yo Paul [name] with history of CAD with stent RCA, DM, bilateral carotid artery disease, HTN, HLD here today for cardiology follow up. His echo in 2009 showed mild LVH and normal LV function with no significant valvular abnormalities. His cath iin 2009 showed a near total occlusion of the RCA beyond the stent in the mid portion. I attempted PCI and was able to balloon the mid lesion but could never pass a balloon distally. He did well after that.  I saw him December 2013 and he c/o chest tightness with minimal exertion associated with SOB. He has noticed his heart pounding but it has not been racing. I arranged a left heart cath on 01/21/12. He was found to have a total occlusion of the RCA which was not amenable to PCI (PCI attempted in 2009). The LAD and Circumflex had moderate disease. I doubled his Imdur to 30 mg po BID. He has done well since then. Normal ABI 10/01/13. Carotid artery dopplers July 2015 with bilateral Paul-79% stenosis.   He is here today for planned follow up. Denies chest pain or SOB. He has started back smoking.Overall feeling well.   Primary Care Physician: Jill Alexanders   Past Medical History  Diagnosis Date  . Diabetes mellitus     with neuropathy  . Hypertension   . AAA (abdominal aortic aneurysm) ([place])   . Gout   . Vitamin B12 deficiency   . Hiatal hernia   . CAD (coronary artery disease)     with cath 09/26/08 showing severe heart disease in RCA with POBA, mild to mod diesease in circ and LAD  . Chronic back pain     back problmes. prior to surgery   . Hyperlipidemia     Past Surgical History  Procedure Laterality Date  . Back surgery      x3  . Left ankle surgery    . Carpal tunnel release      L arm   . 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 Left heart catheterization with coronary angiogram N/A 01/21/2012    Procedure: LEFT HEART  CATHETERIZATION WITH CORONARY ANGIOGRAM;  Surgeon: Burnell Blanks, MD;  Location: [facility] CATH LAB;  Service: Cardiovascular;  Laterality: N/A;    Current Outpatient Prescriptions  Medication Sig Dispense Refill  . ACCU-CHEK AVIVA PLUS test strip Test once to twice daily 200 each 1  . allopurinol (ZYLOPRIM) 300 MG tablet Take 300 mg by mouth daily.    Marland Kitchen aspirin 81 MG chewable tablet Chew 81 mg by mouth daily.    . Blood Glucose Monitoring Suppl (ACCU-CHEK AVIVA PLUS) W/DEVICE KIT 1 kit by Does not apply route daily. USE AS DIRECTED 1 kit 0  . clobetasol cream (TEMOVATE) 4.49 % Apply 1 application topically 2 (two) times daily. 45 g 1  . clopidogrel (PLAVIX) 75 MG tablet Take 1 tablet by mouth  daily 90 tablet 3  . colchicine 0.6 MG tablet Take 1 tablet (0.6 mg total) by mouth 2 (two) times daily. 30 tablet p0  . fish oil-omega-3 fatty acids 1000 MG capsule Take 2 g by mouth daily. By mouth    . isosorbide mononitrate (IMDUR) 30 MG 24 hr tablet Take 1 tablet by mouth  twice a day 180 tablet 1  . Lancets MISC PT IS TO TEST 1 TO  2 TIMES A DAY 300 each PRN  . metFORMIN (GLUCOPHAGE-XR) 500 MG 24 hr tablet Take 2 tablets by mouth  daily 180 tablet 1  . metoprolol tartrate (LOPRESSOR) 25 MG tablet Take 1 tablet by mouth two  times daily 180 tablet 3  . nitroGLYCERIN (NITROSTAT) 0.4 MG SL tablet Place 0.4 mg under the tongue every 5 (five) minutes as needed. For chest pain    . quinapril (ACCUPRIL) 20 MG tablet Take 1 tablet by mouth  twice a day 180 tablet 1  . simvastatin (ZOCOR) 80 MG tablet Take 1 tablet by mouth at  bedtime 90 tablet 3   No current facility-administered medications for this visit.    No Known Allergies  Social History   Social History  . Marital Status: Single    Spouse Name: N/A  . Number of Children: N/A  . Years of Education: N/A   Occupational History  . Not on file.   Social History Main Topics  . Smoking status: Former Smoker    Types: Cigars  .  Smokeless tobacco: Never Used     Comment: quit smoking cigars right before christmas 2014!  Marland Kitchen Alcohol Use: 0.6 oz/week    1 Glasses of wine per week  . Drug Use: No  . Sexual Activity: Yes   Other Topics Concern  . Not on file   Social History Narrative   Divorced, 1 child.    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    Owned roofing co. In [place]    Family History  Problem Relation Age of Onset  . Heart attack Mother   . Cancer Mother   . Heart attack Sister     Review of Systems:  As stated in the HPI and otherwise negative.   BP 110/58 mmHg  Pulse 68  Ht '5\' 10"'  (1.778 m)  Wt 223 lb 12.8 oz (101.515 kg)  BMI 32.11 kg/m2  Physical Examination: General: Well developed, well nourished, NAD HEENT: OP clear, mucus membranes moist SKIN: warm, dry. No rashes. Neuro: No focal deficits Musculoskeletal: Muscle strength 5/5 all ext Psychiatric: Mood and affect normal Neck: No JVD, no carotid bruits, no thyromegaly, no lymphadenopathy. Lungs:Clear bilaterally, no wheezes, rhonci, crackles Cardiovascular: Regular rate and rhythm. No murmurs, gallops or rubs. Abdomen:Soft. Bowel sounds present. Non-tender.  Extremities: Mild bilateral lower extremity edema. Pulses are 2 + in the bilateral DP/PT.  Carotid artery disease: 08/14/13: Bilateral Paul-79% stenosis.   EKG:  EKG is ordered today. The ekg ordered today demonstrates sinus brady, rate 54 bpm  Recent Labs: 03/03/2015: ALT 18; BUN 18; Creat 0.88; Hemoglobin 10.8*; Platelets 199; Potassium 4.9; Sodium 138   Lipid Panel    Component Value Date/Time   CHOL 107* 03/03/2015 0001   TRIG 133 03/03/2015 0001   HDL 30* 03/03/2015 0001   CHOLHDL 3.6 03/03/2015 0001   VLDL 27 03/03/2015 0001   LDLCALC 50 03/03/2015 0001     Wt Readings from Last 3 Encounters:  03/10/15 223 lb 12.8 oz (101.515 kg)  03/03/15 222 lb 2 oz (100.755 kg)  12/06/14 229 lb 12.8 oz (104.237 kg)     Other studies  Reviewed: Additional studies/ records that were reviewed today include: . Review of the above records demonstrates:    Assessment and Plan:   1. CAD: Stable angina. His RCA is occluded distally, not amenable to PCI. The LAD and Circumflex have moderate disease with no lesions that appear to be flow limiting by cath  December 2013. Continue current meds.  2. HTN: BP well controlled. No changes today.   3. Carotid artery disease: He has moderate bilateral disease which is stable by dopplers July 2016. Repeat July 2017.   4. Hyperlipidemia: Lipids well controlled. Continue statin.   Current medicines are reviewed at length with the patient today.  The patient does not have concerns regarding medicines.  The following changes have been made:  no change  Labs/ tests ordered today include:   No orders of the defined types were placed in this encounter.    Disposition:   FU with me in 6 months  Signed, Lauree Chandler, MD 03/10/2015 11:45 AM    [facility] [address], [place], [place]  [address] Phone: [phone]; Fax: [phone]

## 2015-03-10 NOTE — Patient Instructions (Signed)
Medication Instructions:  Your physician recommends that you continue on your current medications as directed. Please refer to the Current Medication list given to you today.   Labwork: none  Testing/Procedures: Your physician has requested that you have a carotid duplex. This test is an ultrasound of the carotid arteries in your neck. It looks at blood flow through these arteries that supply the brain with blood. Allow one hour for this exam. There are no restrictions or special instructions. To be done in July 2017    Follow-Up: Your physician wants you to follow-up in: 6 months.  You will receive a reminder letter in the mail two months in advance. If you don't receive a letter, please call our office to schedule the follow-up appointment.   Any Other Special Instructions Will Be Listed Below (If Applicable).     If you need a refill on your cardiac medications before your next appointment, please call your pharmacy.

## 2015-03-25 ENCOUNTER — Other Ambulatory Visit: Payer: Medicare Other

## 2015-03-25 ENCOUNTER — Other Ambulatory Visit: Payer: Self-pay

## 2015-03-25 ENCOUNTER — Telehealth: Payer: Self-pay | Admitting: Internal Medicine

## 2015-03-25 DIAGNOSIS — E1140 Type 2 diabetes mellitus with diabetic neuropathy, unspecified: Secondary | ICD-10-CM

## 2015-03-25 DIAGNOSIS — M109 Gout, unspecified: Secondary | ICD-10-CM

## 2015-03-25 LAB — CBC WITH DIFFERENTIAL/PLATELET
Basophils Absolute: 0 10*3/uL (ref 0.0–0.1)
Basophils Relative: 0 % (ref 0–1)
Eosinophils Absolute: 0.2 10*3/uL (ref 0.0–0.7)
Eosinophils Relative: 2 % (ref 0–5)
HCT: 30.6 % — ABNORMAL LOW (ref 39.0–52.0)
Hemoglobin: 9.9 g/dL — ABNORMAL LOW (ref 13.0–17.0)
Lymphocytes Relative: 26 % (ref 12–46)
Lymphs Abs: 2.3 10*3/uL (ref 0.7–4.0)
MCH: 30.3 pg (ref 26.0–34.0)
MCHC: 32.4 g/dL (ref 30.0–36.0)
MCV: 93.6 fL (ref 78.0–100.0)
MPV: 10.3 fL (ref 8.6–12.4)
Monocytes Absolute: 0.4 10*3/uL (ref 0.1–1.0)
Monocytes Relative: 4 % (ref 3–12)
Neutro Abs: 6.1 10*3/uL (ref 1.7–7.7)
Neutrophils Relative %: 68 % (ref 43–77)
Platelets: 192 10*3/uL (ref 150–400)
RBC: 3.27 MIL/uL — ABNORMAL LOW (ref 4.22–5.81)
RDW: 16.1 % — ABNORMAL HIGH (ref 11.5–15.5)
WBC: 8.9 10*3/uL (ref 4.0–10.5)

## 2015-03-25 MED ORDER — ALLOPURINOL 300 MG PO TABS: 300.0000 mg | ORAL_TABLET | Freq: Every day | ORAL | Status: DC

## 2015-03-25 MED ORDER — COLCHICINE 0.6 MG PO TABS: 0.6000 mg | ORAL_TABLET | Freq: Every day | ORAL | Status: DC

## 2015-03-25 NOTE — Telephone Encounter (Signed)
Pt came in today for labs and had a question about gout meds. Pt just finished the gout study today and was told to talk to pcp about meds. Pt states he was on colchicine and allopurinol before he went on the gout study and he had continued on the med while in study but now that its over, He wants to know does he still need to be on colchicine and allopurinol, if so he needs it sent to mail order.

## 2015-03-26 ENCOUNTER — Other Ambulatory Visit: Payer: Self-pay | Admitting: Family Medicine

## 2015-03-26 DIAGNOSIS — D649 Anemia, unspecified: Secondary | ICD-10-CM

## 2015-04-06 ENCOUNTER — Encounter (HOSPITAL_COMMUNITY): Payer: Self-pay | Admitting: *Deleted

## 2015-04-06 ENCOUNTER — Emergency Department (HOSPITAL_COMMUNITY)
Admission: EM | Admit: 2015-04-06 | Discharge: 2015-04-06 | Disposition: A | Discharge status: Home / Self Care | Payer: Medicare Other | Attending: Emergency Medicine | Admitting: Emergency Medicine

## 2015-04-06 DIAGNOSIS — R2230 Localized swelling, mass and lump, unspecified upper limb: Secondary | ICD-10-CM | POA: Diagnosis not present

## 2015-04-06 DIAGNOSIS — M79601 Pain in right arm: Secondary | ICD-10-CM | POA: Diagnosis not present

## 2015-04-06 DIAGNOSIS — I2510 Atherosclerotic heart disease of native coronary artery without angina pectoris: Secondary | ICD-10-CM | POA: Insufficient documentation

## 2015-04-06 DIAGNOSIS — E119 Type 2 diabetes mellitus without complications: Secondary | ICD-10-CM | POA: Insufficient documentation

## 2015-04-06 DIAGNOSIS — I10 Essential (primary) hypertension: Secondary | ICD-10-CM | POA: Insufficient documentation

## 2015-04-06 DIAGNOSIS — G8929 Other chronic pain: Secondary | ICD-10-CM | POA: Diagnosis not present

## 2015-04-06 NOTE — ED Notes (Addendum)
Pt reports right arm pain for 2-3 days. Pain is under right arm, radiates down his arm and into his hand. Reports swelling to hand. Denies any obv injury. Pain increases when lying down, had to sleep sitting up past two nights.

## 2015-04-06 NOTE — ED Notes (Signed)
No response for room assignment.

## 2015-04-06 NOTE — ED Notes (Signed)
Pt called x2 in waiting room with no response.

## 2015-04-07 ENCOUNTER — Ambulatory Visit (INDEPENDENT_AMBULATORY_CARE_PROVIDER_SITE_OTHER): Payer: Medicare Other | Admitting: Family Medicine

## 2015-04-07 ENCOUNTER — Encounter: Payer: Self-pay | Admitting: Family Medicine

## 2015-04-07 VITALS — BP 118/78 | HR 63 | Wt 227.0 lb

## 2015-04-07 DIAGNOSIS — M25511 Pain in right shoulder: Secondary | ICD-10-CM

## 2015-04-07 DIAGNOSIS — D649 Anemia, unspecified: Secondary | ICD-10-CM

## 2015-04-07 LAB — CBC WITH DIFFERENTIAL/PLATELET
Basophils Absolute: 0 10*3/uL (ref 0.0–0.1)
Basophils Relative: 0 % (ref 0–1)
Eosinophils Absolute: 0.2 10*3/uL (ref 0.0–0.7)
Eosinophils Relative: 2 % (ref 0–5)
HCT: 30.6 % — ABNORMAL LOW (ref 39.0–52.0)
Hemoglobin: 10 g/dL — ABNORMAL LOW (ref 13.0–17.0)
Lymphocytes Relative: 34 % (ref 12–46)
Lymphs Abs: 2.6 10*3/uL (ref 0.7–4.0)
MCH: 30.2 pg (ref 26.0–34.0)
MCHC: 32.7 g/dL (ref 30.0–36.0)
MCV: 92.4 fL (ref 78.0–100.0)
MPV: 10.6 fL (ref 8.6–12.4)
Monocytes Absolute: 0.4 10*3/uL (ref 0.1–1.0)
Monocytes Relative: 5 % (ref 3–12)
Neutro Abs: 4.4 10*3/uL (ref 1.7–7.7)
Neutrophils Relative %: 59 % (ref 43–77)
Platelets: 200 10*3/uL (ref 150–400)
RBC: 3.31 MIL/uL — ABNORMAL LOW (ref 4.22–5.81)
RDW: 16.5 % — ABNORMAL HIGH (ref 11.5–15.5)
WBC: 7.5 10*3/uL (ref 4.0–10.5)

## 2015-04-07 NOTE — Patient Instructions (Signed)
Take 4 Advil 3 times per day. Pain attention to anything that makes this better or worse and let me know

## 2015-04-07 NOTE — Progress Notes (Signed)
   Subjective:    Patient ID: LUCUS FAUTEUX, male    DOB: 23-Sep-1940, 75 y.o.   MRN: [id number]  HPI He complains of a one-week history of right shoulder pain with radiation down the right arm. He also complains of swelling in the hand. No history of injury to the shoulder. The pain is not made worse with motion of the shoulder, breathing. No associated shortness of breath, chest pain or weakness. He did try 3 Advil but got no benefit from this. Review of the record also indicates he has had 2 hemoglobins were slightly low. He's had no abdominal pain, melena or black tarry stools.   Review of Systems     Objective:   Physical Exam Alert and in no distress. Full motion of the shoulder without pain. Normal motor and sensory and pulses and lungs are clear to auscultation.       Assessment & Plan:  Anemia, unspecified anemia type - Plan: CBC with Differential/Platelet  Right shoulder pain unclear as to what's causing the shoulder pain. Will recommend higher dosing of Advil for the next week or so and also keep track of anything that makes this better or worse. Also check a CBC

## 2015-04-18 ENCOUNTER — Emergency Department (HOSPITAL_COMMUNITY): Payer: Medicare Other

## 2015-04-18 ENCOUNTER — Encounter (HOSPITAL_COMMUNITY): Payer: Self-pay

## 2015-04-18 ENCOUNTER — Observation Stay (HOSPITAL_COMMUNITY)
Admission: EM | Admit: 2015-04-18 | Discharge: 2015-04-22 | Disposition: A | Discharge status: Skilled Nursing Facility | Payer: Medicare Other | Attending: Internal Medicine | Admitting: Internal Medicine

## 2015-04-18 DIAGNOSIS — D638 Anemia in other chronic diseases classified elsewhere: Secondary | ICD-10-CM | POA: Diagnosis not present

## 2015-04-18 DIAGNOSIS — R197 Diarrhea, unspecified: Secondary | ICD-10-CM | POA: Diagnosis present

## 2015-04-18 DIAGNOSIS — E785 Hyperlipidemia, unspecified: Secondary | ICD-10-CM | POA: Insufficient documentation

## 2015-04-18 DIAGNOSIS — I10 Essential (primary) hypertension: Secondary | ICD-10-CM | POA: Insufficient documentation

## 2015-04-18 DIAGNOSIS — I714 Abdominal aortic aneurysm, without rupture: Secondary | ICD-10-CM | POA: Diagnosis not present

## 2015-04-18 DIAGNOSIS — R945 Abnormal results of liver function studies: Secondary | ICD-10-CM | POA: Diagnosis present

## 2015-04-18 DIAGNOSIS — E872 Acidosis: Secondary | ICD-10-CM | POA: Insufficient documentation

## 2015-04-18 DIAGNOSIS — R600 Localized edema: Secondary | ICD-10-CM | POA: Diagnosis not present

## 2015-04-18 DIAGNOSIS — E860 Dehydration: Secondary | ICD-10-CM | POA: Diagnosis not present

## 2015-04-18 DIAGNOSIS — N179 Acute kidney failure, unspecified: Secondary | ICD-10-CM | POA: Diagnosis present

## 2015-04-18 DIAGNOSIS — R7989 Other specified abnormal findings of blood chemistry: Secondary | ICD-10-CM | POA: Diagnosis present

## 2015-04-18 DIAGNOSIS — R404 Transient alteration of awareness: Secondary | ICD-10-CM | POA: Diagnosis not present

## 2015-04-18 DIAGNOSIS — M109 Gout, unspecified: Secondary | ICD-10-CM | POA: Diagnosis not present

## 2015-04-18 DIAGNOSIS — R109 Unspecified abdominal pain: Secondary | ICD-10-CM | POA: Insufficient documentation

## 2015-04-18 DIAGNOSIS — Z89421 Acquired absence of other right toe(s): Secondary | ICD-10-CM | POA: Insufficient documentation

## 2015-04-18 DIAGNOSIS — E538 Deficiency of other specified B group vitamins: Secondary | ICD-10-CM | POA: Diagnosis present

## 2015-04-18 DIAGNOSIS — R531 Weakness: Principal | ICD-10-CM

## 2015-04-18 DIAGNOSIS — R1011 Right upper quadrant pain: Secondary | ICD-10-CM | POA: Diagnosis not present

## 2015-04-18 DIAGNOSIS — R748 Abnormal levels of other serum enzymes: Secondary | ICD-10-CM | POA: Insufficient documentation

## 2015-04-18 DIAGNOSIS — Z7902 Long term (current) use of antithrombotics/antiplatelets: Secondary | ICD-10-CM | POA: Diagnosis not present

## 2015-04-18 DIAGNOSIS — D759 Disease of blood and blood-forming organs, unspecified: Secondary | ICD-10-CM | POA: Insufficient documentation

## 2015-04-18 DIAGNOSIS — Z79899 Other long term (current) drug therapy: Secondary | ICD-10-CM | POA: Insufficient documentation

## 2015-04-18 DIAGNOSIS — E119 Type 2 diabetes mellitus without complications: Secondary | ICD-10-CM

## 2015-04-18 DIAGNOSIS — D696 Thrombocytopenia, unspecified: Secondary | ICD-10-CM | POA: Insufficient documentation

## 2015-04-18 DIAGNOSIS — E118 Type 2 diabetes mellitus with unspecified complications: Secondary | ICD-10-CM

## 2015-04-18 DIAGNOSIS — Z7982 Long term (current) use of aspirin: Secondary | ICD-10-CM | POA: Diagnosis not present

## 2015-04-18 DIAGNOSIS — Z87891 Personal history of nicotine dependence: Secondary | ICD-10-CM | POA: Diagnosis not present

## 2015-04-18 DIAGNOSIS — E1140 Type 2 diabetes mellitus with diabetic neuropathy, unspecified: Secondary | ICD-10-CM | POA: Diagnosis not present

## 2015-04-18 DIAGNOSIS — R262 Difficulty in walking, not elsewhere classified: Secondary | ICD-10-CM

## 2015-04-18 DIAGNOSIS — Z7984 Long term (current) use of oral hypoglycemic drugs: Secondary | ICD-10-CM | POA: Diagnosis not present

## 2015-04-18 DIAGNOSIS — I2510 Atherosclerotic heart disease of native coronary artery without angina pectoris: Secondary | ICD-10-CM | POA: Diagnosis present

## 2015-04-18 DIAGNOSIS — Z89411 Acquired absence of right great toe: Secondary | ICD-10-CM | POA: Diagnosis not present

## 2015-04-18 LAB — URINALYSIS, ROUTINE W REFLEX MICROSCOPIC
Bilirubin Urine: NEGATIVE
Glucose, UA: NEGATIVE mg/dL
Hgb urine dipstick: NEGATIVE
Ketones, ur: NEGATIVE mg/dL
Leukocytes, UA: NEGATIVE
Nitrite: NEGATIVE
Protein, ur: NEGATIVE mg/dL
Specific Gravity, Urine: 1.02 (ref 1.005–1.030)
pH: 5.5 (ref 5.0–8.0)

## 2015-04-18 LAB — COMPREHENSIVE METABOLIC PANEL
ALT: 156 U/L — ABNORMAL HIGH (ref 17–63)
AST: 211 U/L — ABNORMAL HIGH (ref 15–41)
Albumin: 2.7 g/dL — ABNORMAL LOW (ref 3.5–5.0)
Alkaline Phosphatase: 147 U/L — ABNORMAL HIGH (ref 38–126)
Anion gap: 11 (ref 5–15)
BUN: 70 mg/dL — ABNORMAL HIGH (ref 6–20)
CO2: 17 mmol/L — ABNORMAL LOW (ref 22–32)
Calcium: 9 mg/dL (ref 8.9–10.3)
Chloride: 113 mmol/L — ABNORMAL HIGH (ref 101–111)
Creatinine, Ser: 1.82 mg/dL — ABNORMAL HIGH (ref 0.61–1.24)
GFR calc Af Amer: 41 mL/min — ABNORMAL LOW (ref >60)
GFR calc non Af Amer: 35 mL/min — ABNORMAL LOW (ref >60)
Glucose, Bld: 163 mg/dL — ABNORMAL HIGH (ref 65–99)
Potassium: 4.6 mmol/L (ref 3.5–5.1)
Sodium: 141 mmol/L (ref 135–145)
Total Bilirubin: 0.8 mg/dL (ref 0.3–1.2)
Total Protein: 6.8 g/dL (ref 6.5–8.1)

## 2015-04-18 LAB — TYPE AND SCREEN
ABO/RH(D): O POS
Antibody Screen: NEGATIVE

## 2015-04-18 LAB — CBC
HCT: 31.6 % — ABNORMAL LOW (ref 39.0–52.0)
Hemoglobin: 11 g/dL — ABNORMAL LOW (ref 13.0–17.0)
MCH: 31.2 pg (ref 26.0–34.0)
MCHC: 34.8 g/dL (ref 30.0–36.0)
MCV: 89.5 fL (ref 78.0–100.0)
Platelets: 88 10*3/uL — ABNORMAL LOW (ref 150–400)
RBC: 3.53 MIL/uL — ABNORMAL LOW (ref 4.22–5.81)
RDW: 16.4 % — ABNORMAL HIGH (ref 11.5–15.5)
WBC: 6.3 10*3/uL (ref 4.0–10.5)

## 2015-04-18 LAB — GASTROINTESTINAL PANEL BY PCR, STOOL (REPLACES STOOL CULTURE)

## 2015-04-18 LAB — POC OCCULT BLOOD, ED: Fecal Occult Bld: NEGATIVE

## 2015-04-18 LAB — C DIFFICILE QUICK SCREEN W PCR REFLEX
C Diff antigen: NEGATIVE
C Diff interpretation: NEGATIVE
C Diff toxin: NEGATIVE

## 2015-04-18 LAB — GLUCOSE, CAPILLARY
Glucose-Capillary: 147 mg/dL — ABNORMAL HIGH (ref 65–99)
Glucose-Capillary: 106 mg/dL — ABNORMAL HIGH (ref 65–99)

## 2015-04-18 LAB — LIPASE, BLOOD: Lipase: 20 U/L (ref 11–51)

## 2015-04-18 LAB — ETHANOL: Alcohol, Ethyl (B): <5 mg/dL (ref <5)

## 2015-04-18 LAB — ABO/RH: ABO/RH(D): O POS

## 2015-04-18 MED ORDER — SODIUM CHLORIDE 0.9 % IV SOLN
INTRAVENOUS | Status: DC
  Administered 2015-04-18 (×2): via INTRAVENOUS

## 2015-04-18 MED ORDER — ACETAMINOPHEN 325 MG PO TABS: 650.0000 mg | ORAL_TABLET | Freq: Four times a day (QID) | ORAL | Status: DC | PRN

## 2015-04-18 MED ORDER — ONDANSETRON HCL 4 MG PO TABS: 4.0000 mg | ORAL_TABLET | Freq: Four times a day (QID) | ORAL | Status: DC | PRN

## 2015-04-18 MED ORDER — ASPIRIN 81 MG PO CHEW
81.0000 mg | CHEWABLE_TABLET | Freq: Every day | ORAL | Status: DC
  Administered 2015-04-18 – 2015-04-22 (×5): 81 mg via ORAL
  Filled 2015-04-18 (×5): qty 1

## 2015-04-18 MED ORDER — ACETAMINOPHEN 650 MG RE SUPP: 650.0000 mg | Freq: Four times a day (QID) | RECTAL | Status: DC | PRN

## 2015-04-18 MED ORDER — ATORVASTATIN CALCIUM 40 MG PO TABS
40.0000 mg | ORAL_TABLET | Freq: Every day | ORAL | Status: DC
  Filled 2015-04-18: qty 1

## 2015-04-18 MED ORDER — OMEGA-3 FATTY ACIDS 1000 MG PO CAPS
2.0000 g | ORAL_CAPSULE | Freq: Every day | ORAL | Status: DC
Start: 2015-04-18 — End: 2015-04-22
  Administered 2015-04-18 – 2015-04-22 (×4): 2 g via ORAL
  Filled 2015-04-18 (×5): qty 2

## 2015-04-18 MED ORDER — LACTATED RINGERS IV BOLUS (SEPSIS)
1000.0000 mL | Freq: Once | INTRAVENOUS | Status: AC
  Administered 2015-04-18: 1000 mL via INTRAVENOUS

## 2015-04-18 MED ORDER — METOPROLOL TARTRATE 25 MG PO TABS
25.0000 mg | ORAL_TABLET | Freq: Two times a day (BID) | ORAL | Status: DC
  Administered 2015-04-18 – 2015-04-22 (×9): 25 mg via ORAL
  Filled 2015-04-18 (×10): qty 1

## 2015-04-18 MED ORDER — CLOPIDOGREL BISULFATE 75 MG PO TABS
75.0000 mg | ORAL_TABLET | Freq: Every day | ORAL | Status: DC
  Administered 2015-04-18 – 2015-04-22 (×5): 75 mg via ORAL
  Filled 2015-04-18 (×5): qty 1

## 2015-04-18 MED ORDER — ONDANSETRON HCL 4 MG/2ML IJ SOLN: 4.0000 mg | Freq: Four times a day (QID) | INTRAMUSCULAR | Status: DC | PRN

## 2015-04-18 MED ORDER — INSULIN ASPART 100 UNIT/ML ~~LOC~~ SOLN
0.0000 [IU] | Freq: Three times a day (TID) | SUBCUTANEOUS | Status: DC
  Administered 2015-04-18: 1 [IU] via SUBCUTANEOUS
  Administered 2015-04-19 (×2): 2 [IU] via SUBCUTANEOUS
  Administered 2015-04-19 – 2015-04-20 (×3): 1 [IU] via SUBCUTANEOUS
  Administered 2015-04-20: 3 [IU] via SUBCUTANEOUS
  Administered 2015-04-21 – 2015-04-22 (×4): 2 [IU] via SUBCUTANEOUS
  Administered 2015-04-22: 1 [IU] via SUBCUTANEOUS

## 2015-04-18 MED ORDER — ISOSORBIDE MONONITRATE ER 30 MG PO TB24
30.0000 mg | ORAL_TABLET | Freq: Every day | ORAL | Status: DC
  Administered 2015-04-18 – 2015-04-22 (×4): 30 mg via ORAL
  Filled 2015-04-18 (×5): qty 1

## 2015-04-18 MED ORDER — COLCHICINE 0.6 MG PO TABS
0.6000 mg | ORAL_TABLET | Freq: Every day | ORAL | Status: DC
  Administered 2015-04-18 – 2015-04-19 (×2): 0.6 mg via ORAL
  Filled 2015-04-18 (×3): qty 1

## 2015-04-18 NOTE — Evaluation (Signed)
Physical Therapy Evaluation Patient Details Name: Paul [name] MRN: [id number] DOB: January 08, 1941 Today's Date: 04/18/2015   History of Present Illness  75 yo male with history of CAD with stent RCA, DM, bilateral carotid artery disease, HTN, HLD and admitted with diarrhea, weakness, dehydration  Clinical Impression  Pt admitted with above diagnosis. Pt currently with functional limitations due to the deficits listed below (see PT Problem List).  Pt will benefit from skilled PT to increase their independence and safety with mobility to allow discharge to the venue listed below.  Pt initially not agreeable however family requested PT return to room.  Pt able to sit EOB and perform standing with min assist.  Pt reports too weak to ambulate and returned to supine.  Pt appears to be self limiting mobility so discussed SNF if pt unable to ambulate or requiring more assist as family would not be able to physically assist at home.  If pt progresses then HHPT.      Follow Up Recommendations Supervision/Assistance - 24 hour;SNF    Equipment Recommendations  3in1 (PT)    Recommendations for Other Services       Precautions / Restrictions Precautions Precautions: Fall Restrictions Weight Bearing Restrictions: No      Mobility  Bed Mobility Overal bed mobility: Modified Independent                Transfers Overall transfer level: Needs assistance Equipment used: Rolling walker (2 wheeled) Transfers: Sit to/from Stand Sit to Stand: Min assist;From elevated surface         General transfer comment: verbal cues for safe technique, assist to rise and steady  Ambulation/Gait Ambulation/Gait assistance:  (pt refused)              Stairs            Wheelchair Mobility    Modified Rankin (Stroke Patients Only)       Balance                                             Pertinent Vitals/Pain Pain Assessment: No/denies pain    Home Living  Family/patient expects to be discharged to:: Private residence Living Arrangements: Spouse/significant other Available Help at Discharge: Family Type of Home: House Home Access: Stairs to enter   Technical brewer of Steps: 3-4 Home Layout: One level Home Equipment: Environmental consultant - 2 wheels      Prior Function Level of Independence: Independent               Hand Dominance        Extremity/Trunk Assessment               Lower Extremity Assessment: Generalized weakness         Communication   Communication: No difficulties  Cognition Arousal/Alertness: Awake/alert Behavior During Therapy: WFL for tasks assessed/performed Overall Cognitive Status: Within Functional Limits for tasks assessed                      General Comments      Exercises        Assessment/Plan    PT Assessment Patient needs continued PT services  PT Diagnosis Difficulty walking;Generalized weakness   PT Problem List Decreased strength;Decreased activity tolerance;Decreased mobility;Decreased knowledge of use of DME  PT Treatment Interventions DME instruction;Gait training;Functional mobility training;Patient/family education;Therapeutic activities;Therapeutic exercise;Stair training  PT Goals (Current goals can be found in the Care Plan section) Acute Rehab PT Goals PT Goal Formulation: With patient/family Time For Goal Achievement: 04/25/15 Potential to Achieve Goals: Good    Frequency Min 3X/week   Barriers to discharge        Co-evaluation               End of Session   Activity Tolerance: Other (comment) (pt self limited mobility) Patient left: in bed;with call bell/phone within reach;with family/visitor present           Time: 1354-1405 PT Time Calculation (min) (ACUTE ONLY): 11 min   Charges:   PT Evaluation $PT Eval Low Complexity: 1 Procedure     PT G Codes:        Khylah Kendra,KATHrine E 04/18/2015, 2:13 PM Carmelia Bake, PT,  DPT 04/18/2015 Pager: [phone]

## 2015-04-18 NOTE — ED Notes (Addendum)
According to EMS, pt c/o of falling to seated position at home 1hr ago. Pt c/o of weakness and diarrhea x1 week. Pt arrives to [facility] ED A+OX4, pt denies pain, hitting his head, and n/v.

## 2015-04-18 NOTE — ED Provider Notes (Signed)
CSN: [id number]     Arrival date & time 04/18/15  0144 History  By signing my name below, I, Altamease Oiler, attest that this documentation has been prepared under the direction and in the presence of Varney Biles, MD. Electronically Signed: Altamease Oiler, ED Scribe. 04/18/2015. 3:06 AM   Chief Complaint  Patient presents with  . Diarrhea  . Weakness  . Fall   The history is provided by the patient, medical records and the spouse. No language interpreter was used.   Brought in by EMS, Paul [name] is a 76 y.o. male with PMHx of DM, HTN, HLD, CAD, AAA, and chronic back pain who presents to the Emergency Department with his wife complaining of generalized weakness with onset 1.5 weeks ago. The weakness significantly worsened approximately 3 hours ago when he got out of bed and fell to the floor in a seated position. He stayed on the floor until EMS arrival because he could not get up and does not feel as if he can walk. No head injury or LOC. Associated symptoms include diarrhea for 1.5 weeks. He notes up to 10 episodes of diarrhea daily. The stool is sometimes dark brown but not bloody or tarry.  Pt denies syncope, headache, abdominal pain, nausea, vomiting, new back pain, fever, chills, and arthralgias or myalgias related to the fall. No known sick contact. No recent hospitalization or abx. No recent travel. No history of abdominal surgery.     Past Medical History  Diagnosis Date  . Diabetes mellitus     with neuropathy  . Hypertension   . AAA (abdominal aortic aneurysm) ([place])   . Gout   . Vitamin B12 deficiency   . Hiatal hernia   . CAD (coronary artery disease)     with cath 09/26/08 showing severe heart disease in RCA with POBA, mild to mod diesease in circ and LAD  . Chronic back pain     back problmes. prior to surgery   . Hyperlipidemia    Past Surgical History  Procedure Laterality Date  . Back surgery      x3  . Left ankle surgery    . Carpal tunnel release       L arm   . 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 Left heart catheterization with coronary angiogram N/A 01/21/2012    Procedure: LEFT HEART CATHETERIZATION WITH CORONARY ANGIOGRAM;  Surgeon: Burnell Blanks, MD;  Location: [facility] CATH LAB;  Service: Cardiovascular;  Laterality: N/A;   Family History  Problem Relation Age of Onset  . Heart attack Mother   . Cancer Mother   . Heart attack Sister    Social History  Substance Use Topics  . Smoking status: Former Smoker    Types: Cigars  . Smokeless tobacco: Never Used     Comment: quit smoking cigars right before christmas 2014!  Marland Kitchen Alcohol Use: 0.6 oz/week    1 Glasses of wine per week    Review of Systems  10 Systems reviewed and all are negative for acute change except as noted in the HPI.  Allergies  Review of patient's allergies indicates no known allergies.  Home Medications   Prior to Admission medications   Medication Sig Start Date End Date Taking? Authorizing Provider  aspirin 81 MG chewable tablet Chew 81 mg by mouth daily.   Yes Historical Provider, MD  clopidogrel (PLAVIX) 75 MG tablet Take 1 tablet by mouth  daily 10/22/14  Yes John  Mardelle Matte, MD  colchicine 0.6 MG tablet Take 1 tablet (0.6 mg total) by mouth daily. 03/25/15 03/10/17 Yes Denita Lung, MD  fish oil-omega-3 fatty acids 1000 MG capsule Take 2 g by mouth daily.    Yes Historical Provider, MD  isosorbide mononitrate (IMDUR) 30 MG 24 hr tablet Take 1 tablet by mouth  twice a day 12/31/14  Yes Burnell Blanks, MD  metFORMIN (GLUCOPHAGE-XR) 500 MG 24 hr tablet Take 2 tablets by mouth  daily 10/22/14  Yes Denita Lung, MD  metoprolol tartrate (LOPRESSOR) 25 MG tablet Take 1 tablet by mouth two  times daily 10/22/14  Yes Denita Lung, MD  quinapril (ACCUPRIL) 20 MG tablet Take 1 tablet by mouth  twice a day 12/24/14  Yes Denita Lung, MD  simvastatin (ZOCOR) 80 MG tablet Take 1 tablet by mouth at  bedtime 10/22/14  Yes Denita Lung, MD   allopurinol (ZYLOPRIM) 300 MG tablet Take 1 tablet (300 mg total) by mouth daily. Patient not taking: Reported on 04/18/2015 03/25/15   Denita Lung, MD  clobetasol cream (TEMOVATE) [id number] % Apply 1 application topically 2 (two) times daily. Patient not taking: Reported on 04/18/2015 03/12/13   Denita Lung, MD  nitroGLYCERIN (NITROSTAT) 0.4 MG SL tablet Place 0.4 mg under the tongue every 5 (five) minutes as needed. For chest pain 10/12/11   Denita Lung, MD   BP 128/61 mmHg  Pulse 76  Temp(Src) 98.6 F (37 C) (Oral)  Resp 18  Ht 5\' 10"  (1.778 m)  Wt 214 lb 11.7 oz (97.4 kg)  BMI 30.81 kg/m2  SpO2 96% Physical Exam  Constitutional: He is oriented to person, place, and time. He appears well-developed and well-nourished.  HENT:  Head: Normocephalic and atraumatic.  Eyes: EOM are normal.  Neck: Normal range of motion.  Cardiovascular: Normal rate, regular rhythm, normal heart sounds and intact distal pulses.   Pulmonary/Chest: Effort normal and breath sounds normal. No respiratory distress.  Lungs clear to anterior auscultation   Abdominal: Soft. He exhibits no distension. Bowel sounds are increased. There is no rebound and no guarding.  Pt has RUQ tenderness  Musculoskeletal: Normal range of motion.  Pelvis is stable. Palpation of the upper and lower extremities reveals no significant tenderness.   Neurological: He is alert and oriented to person, place, and time.  Skin: Skin is warm and dry.  Psychiatric: He has a normal mood and affect. Judgment normal.  Nursing note and vitals reviewed.   ED Course  Procedures (including critical care time) DIAGNOSTIC STUDIES: Oxygen Saturation is 95% on RA,  normal by my interpretation.    COORDINATION OF CARE: 2:58 AM Discussed treatment plan which includes lab work and abdominal US with pt at bedside and pt agreed to plan.  Labs Review Labs Reviewed  COMPREHENSIVE METABOLIC PANEL - Abnormal; Notable for the following:    Chloride  113 (*)    CO2 17 (*)    Glucose, Bld 163 (*)    BUN 70 (*)    Creatinine, Ser 1.82 (*)    Albumin 2.7 (*)    AST 211 (*)    ALT 156 (*)    Alkaline Phosphatase 147 (*)    GFR calc non Af Amer 35 (*)    GFR calc Af Amer 41 (*)    All other components within normal limits  CBC - Abnormal; Notable for the following:    RBC 3.53 (*)    Hemoglobin 11.0 (*)  HCT 31.6 (*)    RDW 16.4 (*)    Platelets 88 (*)    All other components within normal limits  URINALYSIS, ROUTINE W REFLEX MICROSCOPIC (NOT AT [facility]) - Abnormal; Notable for the following:    APPearance CLOUDY (*)    All other components within normal limits  GLUCOSE, CAPILLARY - Abnormal; Notable for the following:    Glucose-Capillary 147 (*)    All other components within normal limits  CBC - Abnormal; Notable for the following:    RBC 3.19 (*)    Hemoglobin 10.0 (*)    HCT 28.4 (*)    RDW 15.9 (*)    Platelets 70 (*)    All other components within normal limits  COMPREHENSIVE METABOLIC PANEL - Abnormal; Notable for the following:    Chloride 112 (*)    CO2 17 (*)    Glucose, Bld 116 (*)    BUN 52 (*)    Creatinine, Ser 1.28 (*)    Calcium 8.6 (*)    Total Protein 6.1 (*)    Albumin 2.5 (*)    AST 167 (*)    ALT 149 (*)    GFR calc non Af Amer 54 (*)    All other components within normal limits  GLUCOSE, CAPILLARY - Abnormal; Notable for the following:    Glucose-Capillary 106 (*)    All other components within normal limits  GLUCOSE, CAPILLARY - Abnormal; Notable for the following:    Glucose-Capillary 142 (*)    All other components within normal limits  GLUCOSE, CAPILLARY - Abnormal; Notable for the following:    Glucose-Capillary 152 (*)    All other components within normal limits  GLUCOSE, CAPILLARY - Abnormal; Notable for the following:    Glucose-Capillary 135 (*)    All other components within normal limits  GLUCOSE, CAPILLARY - Abnormal; Notable for the following:    Glucose-Capillary 103 (*)     All other components within normal limits  C DIFFICILE QUICK SCREEN W PCR REFLEX  GASTROINTESTINAL PANEL BY PCR, STOOL (REPLACES STOOL CULTURE)  URINE CULTURE  LIPASE, BLOOD  HEPATITIS PANEL, ACUTE  ETHANOL  CBC WITH DIFFERENTIAL/PLATELET  COMPREHENSIVE METABOLIC PANEL  MAGNESIUM  POC OCCULT BLOOD, ED  TYPE AND SCREEN  ABO/RH    Imaging Review US Aorta  04/18/2015  CLINICAL DATA:  Weakness.  History of abdominal aortic aneurysm. EXAM: ULTRASOUND OF ABDOMINAL AORTA TECHNIQUE: Ultrasound examination of the abdominal aorta was performed to evaluate for abdominal aortic aneurysm. COMPARISON:  11/07/2014 FINDINGS: When remeasured in the same manner on prior there is unchanged fusiform aneurysmal enlargement of the distal aorta with a maximal diameter of 37 mm. The aorta is diffusely atheromatous with wall thickening and calcification. No visible dissection flap or retroperitoneal fluid. IMPRESSION: Fusiform infrarenal aortic aneurysm with unchanged dimensions compared to 2016. Maximal diameter is 37 mm. Electronically Signed   By: Monte Fantasia M.D.   On: 04/18/2015 05:20   US Abdomen Limited Ruq  04/18/2015  CLINICAL DATA:  Abdominal pain for 2 days. EXAM: US ABDOMEN LIMITED - RIGHT UPPER QUADRANT COMPARISON:  None. FINDINGS: Gallbladder: No gallstones or wall thickening visualized. No sonographic Murphy sign noted by sonographer. Common bile duct: Diameter: 5 mm Liver: No focal lesion identified. Within normal limits in parenchymal echogenicity. Antegrade flow in the imaged hepatic and portal venous system. IMPRESSION: Normal right upper quadrant ultrasound. Electronically Signed   By: Monte Fantasia M.D.   On: 04/18/2015 05:06   I have personally reviewed  and evaluated these images and lab results as part of my medical decision-making.   EKG Interpretation None      Date: 04/18/2015  Rate: 77  Rhythm: normal sinus rhythm  QRS Axis: normal  Intervals: normal  ST/T Wave  abnormalities: normal  Conduction Disutrbances: none  Narrative Interpretation: unremarkable    MDM   Final diagnoses:  Abdominal pain  Generalized weakness  Unable to ambulate  AKI (acute kidney injury) ([place])  Elevated LFTs    I personally performed the services described in this documentation, which was scribed in my presence. The recorded information has been reviewed and is accurate.  Pt comes in with cc of abd pain and generalized weakness. He has a new diarrhea. Weakness is profound. Pt's labs have AKI, also has elevated LFTs. Will get US aorta - due to hx of AAA, and weakness, near syncope.  Based on the labs and inability to ambulate -will need admission.   Varney Biles, MD 04/19/15 [phone]

## 2015-04-18 NOTE — Progress Notes (Signed)
PT Cancellation Note  Patient Details Name: Paul [name] MRN: [id number] DOB: November 17, 1940   Cancelled Treatment:    Reason Eval/Treat Not Completed: Other (comment) (Pt declines mobility due to weakness) Pt states he tried to take a few steps this morning and was unable.  Pt declined attempting OOB again today.  Pt aware PT will return tomorrow to attempt mobility and evaluate.   Jo-Anne Kluth,KATHrine E 04/18/2015, 1:53 PM Carmelia Bake, PT, DPT 04/18/2015 Pager: [phone]

## 2015-04-18 NOTE — ED Notes (Signed)
Bed: WA08 Expected date:  Expected time:  Means of arrival:  Comments: EMS 75  Yo male/fall/weak/diarrhea

## 2015-04-18 NOTE — H&P (Signed)
[facility] History and Physical  Paul [name] [id number] DOB: 1940-07-21 DOA: 04/18/2015  Referring physician: ER physician: Dr. Varney Biles  PCP: Wyatt Haste, MD  Chief Complaint: diarrhea and weakness   HPI:  75 yo WM with history of CAD with stent RCA, DM, bilateral carotid artery disease, HTN, HLD who presented to [facility] with reports of worsening weakness over past few days prior to this admission. Patient reports having diarrhea for last week and a half about 8-10 episodes of nonbloody diarrhea a day. He reports his by mouth intake was not as good although he was able to drink fluids and tried to stay hydrated. He noticed that over last few days he had difficulty ambulating and was extremely weak even when getting up from bed. He almost fell because he could not bear weight when getting up. Patient reports no previous history of weakness such as this. No reports of chest pain or palpitations or shortness of breath. No abdominal pain, nausea or vomiting. No blood in the stool. No fevers or chills. No lightheadedness. No loss of consciousness no reports of fall.  In ED, patient was hemodynamically stable. Blood work was significant for hemoglobin of 11, platelets 88, CO2 17, creatinine 1.82, glucose 163. His AST was 211, ALT 156, ALP 147. Bilirubin and lipase were within normal limits. Abdominal ultrasound showed normal right upper quadrant ultrasound.  Assessment & Plan    Principal Problem:   Weakness / Dehydration / Diarrhea / metabolic acidosis - GI pathogen panel is pending. Stool for C. difficile is negative - Continue supportive care with IV fluids for hydration - Obtain physical therapy for further evaluation of weakness  Active Problems:   Coronary atherosclerosis of native coronary artery - Has a history of stent placement - Continue aspirin and Plavix    Gout - Continue colchicine    AKI (acute kidney injury) ([place]) - Probably  related to metformin, Accupril. Those medications placed on hold -Creatinine 1.82 on the admission  - Continue IV fluids - Follow-up BMP tomorrow morning    Controlled type 2 diabetes mellitus without complication, without long-term current use of insulin ([name]) - Recent A1c less than 7 - Hold metformin and use sliding scale insulin for now    Thrombocytopenia ([place]) - Patient does have history of thrombocytopenia even a few years back with baseline low 110 range - Probably due to antiplatelet therapy for stent placement - Platelets on this admission her 9 - Repeat admission labs -Use SCDs for DVT prophylaxis     Anemia of chronic disease - Likely due to antiplatelet therapy - Hemoglobin is 11 - No current indications for transfusion    Abnormal LFTs - AST is 211, ALT 156, ALP 147, total bilirubin is within normal limits - Abdominal ultrasound did not show acute intra-abdominal findings - Lipase is within normal limits - Obtain alcohol level and acute hepatitis panel  - Repeat CMP in the morning. May need a GI consult in the morning if no improvement in LFTs. Please note within the last few months and even few years ago no abnormal LFTs noted in Epic.   DVT prophylaxis:  - SCD's bialterally  Radiological Exams on Admission: US Aorta 04/18/2015  Fusiform infrarenal aortic aneurysm with unchanged dimensions compared to 2016. Maximal diameter is 37 mm. Electronically Signed   By: Monte Fantasia M.D.   On: 04/18/2015 05:20   US Abdomen Limited Ruq 04/18/2015   Normal right upper quadrant ultrasound. Electronically Signed  By: Monte Fantasia M.D.   On: 04/18/2015 05:06    EKG: not done in ED  Code Status: Full Family Communication: Plan of care discussed with the patient and has wife at the bedside  Disposition Plan: Admit for further evaluation, medical floor   Leisa Lenz, MD  [facility] Pager [phone]  Time spent in minutes: 75 minutes  Review of Systems:   Constitutional: Negative for fever, chills and malaise/fatigue. Negative for diaphoresis.  HENT: Negative for hearing loss, ear pain, nosebleeds, congestion, sore throat, neck pain, tinnitus and ear discharge.   Eyes: Negative for blurred vision, double vision, photophobia, pain, discharge and redness.  Respiratory: Negative for cough, hemoptysis, sputum production, shortness of breath, wheezing and stridor.   Cardiovascular: Negative for chest pain, palpitations, orthopnea, claudication and leg swelling.  Gastrointestinal: Negative for nausea, vomiting and abdominal pain. Negative for heartburn, constipation, blood in stool and melena.  Genitourinary: Negative for dysuria, urgency, frequency, hematuria and flank pain.  Musculoskeletal: Negative for myalgias, back pain, joint pain and falls.  Skin: Negative for itching and rash.  Neurological: Negative for dizziness and positive for weakness. Negative for tingling, tremors, sensory change, speech change, focal weakness, loss of consciousness and headaches.  Endo/Heme/Allergies: Negative for environmental allergies and polydipsia. Does not bruise/bleed easily.  Psychiatric/Behavioral: Negative for suicidal ideas. The patient is not nervous/anxious.      Past Medical History  Diagnosis Date  . Diabetes mellitus     with neuropathy  . Hypertension   . AAA (abdominal aortic aneurysm) ([place])   . Gout   . Vitamin B12 deficiency   . Hiatal hernia   . CAD (coronary artery disease)     with cath 09/26/08 showing severe heart disease in RCA with POBA, mild to mod diesease in circ and LAD  . Chronic back pain     back problmes. prior to surgery   . Hyperlipidemia    Past Surgical History  Procedure Laterality Date  . Back surgery      x3  . Left ankle surgery    . Carpal tunnel release      L arm   . 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 Left heart catheterization with coronary angiogram N/A 01/21/2012    Procedure: LEFT HEART  CATHETERIZATION WITH CORONARY ANGIOGRAM;  Surgeon: Burnell Blanks, MD;  Location: [facility] CATH LAB;  Service: Cardiovascular;  Laterality: N/A;   Social History:  reports that he has quit smoking. His smoking use included Cigars. He has never used smokeless tobacco. He reports that he drinks about 0.6 oz of alcohol per week. He reports that he does not use illicit drugs.  No Known Allergies  Family History:  Family History  Problem Relation Age of Onset  . Heart attack Mother   . Cancer Mother   . Heart attack Sister      Prior to Admission medications   Medication Sig Start Date End Date Taking? Authorizing Provider  aspirin 81 MG chewable tablet Chew 81 mg by mouth daily.   Yes Historical Provider, MD  clopidogrel (PLAVIX) 75 MG tablet Take 1 tablet by mouth  daily 10/22/14  Yes Denita Lung, MD  colchicine 0.6 MG tablet Take 1 tablet (0.6 mg total) by mouth daily. 03/25/15 03/10/17 Yes Denita Lung, MD  fish oil-omega-3 fatty acids 1000 MG capsule Take 2 g by mouth daily.    Yes Historical Provider, MD  isosorbide mononitrate (IMDUR) 30 MG 24  hr tablet Take 1 tablet by mouth  twice a day 12/31/14  Yes Burnell Blanks, MD  metFORMIN (GLUCOPHAGE-XR) 500 MG 24 hr tablet Take 2 tablets by mouth  daily 10/22/14  Yes Denita Lung, MD  metoprolol tartrate (LOPRESSOR) 25 MG tablet Take 1 tablet by mouth two  times daily 10/22/14  Yes Denita Lung, MD  quinapril (ACCUPRIL) 20 MG tablet Take 1 tablet by mouth  twice a day 12/24/14  Yes Denita Lung, MD  simvastatin (ZOCOR) 80 MG tablet Take 1 tablet by mouth at  bedtime 10/22/14  Yes Denita Lung, MD  nitroGLYCERIN (NITROSTAT) 0.4 MG SL tablet Place 0.4 mg under the tongue every 5 (five) minutes as needed. For chest pain 10/12/11   Denita Lung, MD   Physical Exam: Filed Vitals:   04/18/15 0144 04/18/15 0502 04/18/15 0550  BP:  125/63 125/63  Pulse:  87 78  Resp:   16  SpO2: 95% 97% 96%    Physical Exam  Constitutional:  Appears well-developed and well-nourished. No distress.  HENT: Normocephalic. No tonsillar erythema or exudates Eyes: Conjunctivae are normal. No scleral icterus.  Neck: Normal ROM. Neck supple. No JVD. No tracheal deviation. No thyromegaly.  CVS: RRR, S1/S2 appreciated   Pulmonary: Effort and breath sounds normal, no stridor, rhonchi, wheezes, rales.  Abdominal: Soft. BS +,  no distension, tenderness, rebound or guarding.  Musculoskeletal: Normal range of motion. No edema and no tenderness.  Lymphadenopathy: No lymphadenopathy noted, cervical, inguinal. Neuro: Alert. Normal reflexes, muscle tone coordination. No focal neurologic deficits. Skin: Skin is warm and dry. No rash noted.  No erythema. No pallor.  Psychiatric: Normal mood and affect. Behavior, judgment, thought content normal.   Labs on Admission:  Basic Metabolic Panel:  Recent Labs Lab 04/18/15 0154  NA 141  K 4.6  CL 113*  CO2 17*  GLUCOSE 163*  BUN 70*  CREATININE 1.82*  CALCIUM 9.0   Liver Function Tests:  Recent Labs Lab 04/18/15 0154  AST 211*  ALT 156*  ALKPHOS 147*  BILITOT 0.8  PROT 6.8  ALBUMIN 2.7*    Recent Labs Lab 04/18/15 0154  LIPASE 20   No results for input(s): AMMONIA in the last 168 hours. CBC:  Recent Labs Lab 04/18/15 0154  WBC 6.3  HGB 11.0*  HCT 31.6*  MCV 89.5  PLT 88*   Cardiac Enzymes: No results for input(s): CKTOTAL, CKMB, CKMBINDEX, TROPONINI in the last 168 hours. BNP: Invalid input(s): POCBNP CBG: No results for input(s): GLUCAP in the last 168 hours.  If 7PM-7AM, please contact night-coverage [email or web address] Password [email or web address] 04/18/2015, 7:13 AM

## 2015-04-19 DIAGNOSIS — R197 Diarrhea, unspecified: Secondary | ICD-10-CM

## 2015-04-19 DIAGNOSIS — R531 Weakness: Secondary | ICD-10-CM

## 2015-04-19 DIAGNOSIS — M10041 Idiopathic gout, right hand: Secondary | ICD-10-CM

## 2015-04-19 DIAGNOSIS — R7989 Other specified abnormal findings of blood chemistry: Secondary | ICD-10-CM | POA: Diagnosis not present

## 2015-04-19 DIAGNOSIS — N179 Acute kidney failure, unspecified: Secondary | ICD-10-CM

## 2015-04-19 DIAGNOSIS — D696 Thrombocytopenia, unspecified: Secondary | ICD-10-CM

## 2015-04-19 DIAGNOSIS — E860 Dehydration: Secondary | ICD-10-CM

## 2015-04-19 DIAGNOSIS — D638 Anemia in other chronic diseases classified elsewhere: Secondary | ICD-10-CM | POA: Diagnosis not present

## 2015-04-19 DIAGNOSIS — E119 Type 2 diabetes mellitus without complications: Secondary | ICD-10-CM

## 2015-04-19 LAB — CBC
HCT: 28.4 % — ABNORMAL LOW (ref 39.0–52.0)
Hemoglobin: 10 g/dL — ABNORMAL LOW (ref 13.0–17.0)
MCH: 31.3 pg (ref 26.0–34.0)
MCHC: 35.2 g/dL (ref 30.0–36.0)
MCV: 89 fL (ref 78.0–100.0)
Platelets: 70 10*3/uL — ABNORMAL LOW (ref 150–400)
RBC: 3.19 MIL/uL — ABNORMAL LOW (ref 4.22–5.81)
RDW: 15.9 % — ABNORMAL HIGH (ref 11.5–15.5)
WBC: 4.9 10*3/uL (ref 4.0–10.5)

## 2015-04-19 LAB — GLUCOSE, CAPILLARY
Glucose-Capillary: 142 mg/dL — ABNORMAL HIGH (ref 65–99)
Glucose-Capillary: 152 mg/dL — ABNORMAL HIGH (ref 65–99)
Glucose-Capillary: 135 mg/dL — ABNORMAL HIGH (ref 65–99)
Glucose-Capillary: 103 mg/dL — ABNORMAL HIGH (ref 65–99)

## 2015-04-19 LAB — COMPREHENSIVE METABOLIC PANEL
ALT: 149 U/L — ABNORMAL HIGH (ref 17–63)
AST: 167 U/L — ABNORMAL HIGH (ref 15–41)
Albumin: 2.5 g/dL — ABNORMAL LOW (ref 3.5–5.0)
Alkaline Phosphatase: 123 U/L (ref 38–126)
Anion gap: 9 (ref 5–15)
BUN: 52 mg/dL — ABNORMAL HIGH (ref 6–20)
CO2: 17 mmol/L — ABNORMAL LOW (ref 22–32)
Calcium: 8.6 mg/dL — ABNORMAL LOW (ref 8.9–10.3)
Chloride: 112 mmol/L — ABNORMAL HIGH (ref 101–111)
Creatinine, Ser: 1.28 mg/dL — ABNORMAL HIGH (ref 0.61–1.24)
GFR calc Af Amer: >60 mL/min (ref >60)
GFR calc non Af Amer: 54 mL/min — ABNORMAL LOW (ref >60)
Glucose, Bld: 116 mg/dL — ABNORMAL HIGH (ref 65–99)
Potassium: 3.9 mmol/L (ref 3.5–5.1)
Sodium: 138 mmol/L (ref 135–145)
Total Bilirubin: 0.9 mg/dL (ref 0.3–1.2)
Total Protein: 6.1 g/dL — ABNORMAL LOW (ref 6.5–8.1)

## 2015-04-19 LAB — URINE CULTURE: Culture: NO GROWTH

## 2015-04-19 LAB — HEPATITIS PANEL, ACUTE
HCV Ab: 0.2 s/co ratio (ref 0.0–0.9)
Hep A IgM: NEGATIVE
Hep B C IgM: NEGATIVE
Hepatitis B Surface Ag: NEGATIVE

## 2015-04-19 MED ORDER — TRAMADOL HCL 50 MG PO TABS
50.0000 mg | ORAL_TABLET | Freq: Two times a day (BID) | ORAL | Status: DC | PRN
  Administered 2015-04-19 – 2015-04-20 (×2): 50 mg via ORAL
  Filled 2015-04-19 (×2): qty 1

## 2015-04-19 MED ORDER — METHOCARBAMOL 500 MG PO TABS
500.0000 mg | ORAL_TABLET | Freq: Four times a day (QID) | ORAL | Status: DC | PRN
  Administered 2015-04-19: 500 mg via ORAL
  Filled 2015-04-19: qty 1

## 2015-04-19 MED ORDER — SODIUM CHLORIDE 0.9 % IV BOLUS (SEPSIS)
1000.0000 mL | Freq: Once | INTRAVENOUS | Status: AC
  Administered 2015-04-19: 1000 mL via INTRAVENOUS

## 2015-04-19 MED ORDER — LOPERAMIDE HCL 2 MG PO CAPS: 2.0000 mg | ORAL_CAPSULE | ORAL | Status: DC | PRN

## 2015-04-19 MED ORDER — SODIUM CHLORIDE 0.45 % IV SOLN
INTRAVENOUS | Status: DC
Start: 2015-04-19 — End: 2015-04-21
  Administered 2015-04-19 – 2015-04-21 (×5): via INTRAVENOUS

## 2015-04-19 NOTE — Progress Notes (Signed)
[facility] Progress Note  Patient: Paul [name] [id number]   PCP: Wyatt Haste, MD DOB: [birth year]   DOA: 04/18/2015   DOS: 04/19/2015   Date of Service: the patient was seen and examined on 04/19/2015  Subjective: Patient on present hormone generalized pain. Patient mentions that he takes allopurinol on and off. Has been having 3-4 episodes of diarrhea on a daily basis for last 3 weeks. Does not have any chest pain or abdominal pain. No nausea no vomiting. Nutrition: Tolerating oral diet  Brief hospital course: Patient was admitted on 04/18/2015, with complaint of fall, was found to have acute kidney injury as well as acute elevation of LFT.. Currently further plan is continue hydration for AKI, use Imodium for diarrhea.  Assessment and Plan: 1. Weakness Likely secondary to dehydration from diarrhea. Next and continue aggressive IV hydration. Next and orthostatic is positive we will give him one lit of bolus.  2. Diarrhea. GI pathogen panel as well as C. difficile is negative. Neck; etiology is unclear. We will use Imodium.  3. Acute kidney injury. Likely due to dehydration. Next and continue aggressive IV hydration.  4. Elevated LFT. Likely due to dehydration. Next and continue IV hydration and avoid hepatotoxic medication.  5. Recurrent gout. Next on the patient has been on colchicine on a regular basis and has been taking allopurinol on and off basis. Likely this acute presentation of allopurinol is causing the patient to have acute gout. muscle relaxant at present and Tylenol.  6. Type II controlled diabetes mellitus. Continue sliding scale insulin.  7. Chronic cytopenia. Next on low platelet count trending downwards. We'll continue to closely monitor. Next and no active bleeding.  8. Metabolic acidosis without any anion gap. Likely related to IV fluids as well as diarrhea. We will change the fluids to half normal saline  Activity: physical therapy  recommends SNF Bowel regimen: last BM 04/19/2015 DVT Prophylaxis: mechanical compression device. Nutrition: Regular diet Advance goals of care discussion: full code  HPI: As per the H and P dictated on admission, "75 yo WM with history of CAD with stent RCA, DM, bilateral carotid artery disease, HTN, HLD who presented to [facility] long hospital with reports of worsening weakness over past few days prior to this admission. Patient reports having diarrhea for last week and a half about 8-10 episodes of nonbloody diarrhea a day. He reports his by mouth intake was not as good although he was able to drink fluids and tried to stay hydrated. He noticed that over last few days he had difficulty ambulating and was extremely weak even when getting up from bed. He almost fell because he could not bear weight when getting up. Patient reports no previous history of weakness such as this. No reports of chest pain or palpitations or shortness of breath. No abdominal pain, nausea or vomiting. No blood in the stool. No fevers or chills. No lightheadedness. No loss of consciousness no reports of fall." Procedures: none Consultants: none Antibiotics: Anti-infectives    None       Family Communication: family was present at bedside, at the time of interview.  Opportunity was given to ask question and all questions were answered satisfactorily.   Disposition:  Expected discharge date:04/21/2015 Barriers to safe discharge: Improvement in volume status   Intake/Output Summary (Last 24 hours) at 04/19/15 1817 Last data filed at 04/19/15 1747  Gross per 24 hour  Intake   1520 ml  Output    350 ml  Net  1170 ml   Filed Weights   04/18/15 0835 04/19/15 0700  Weight: 103.987 kg (229 lb 4 oz) 97.4 kg (214 lb 11.7 oz)    Objective: Physical Exam: Filed Vitals:   04/18/15 2214 04/19/15 0542 04/19/15 0700 04/19/15 1440  BP: 140/60 137/66    Pulse: 80 54  65  Temp: 97.6 F (36.4 C) 98.2 F (36.8 C)  97.6 F  (36.4 C)  TempSrc: Oral Oral  Oral  Resp: 18 16  16   Height:      Weight:   97.4 kg (214 lb 11.7 oz)   SpO2: 99% 97%  99%     General: Appear in mild distress, no Rash; Oral Mucosa moist. Cardiovascular: S1 and S2 Present, no Murmur, no JVD Respiratory: Bilateral Air entry present and Clear to Auscultation, no Crackles, no wheezes Abdomen: Bowel Sound persent, Soft and no tenderness Extremities: no Pedal edema, no calf tenderness Neurology: Grossly no focal neuro deficit.  Data Reviewed: CBC:  Recent Labs Lab 04/18/15 0154 04/19/15 0520  WBC 6.3 4.9  HGB 11.0* 10.0*  HCT 31.6* 28.4*  MCV 89.5 89.0  PLT 88* 70*   Basic Metabolic Panel:  Recent Labs Lab 04/18/15 0154 04/19/15 0520  NA 141 138  K 4.6 3.9  CL 113* 112*  CO2 17* 17*  GLUCOSE 163* 116*  BUN 70* 52*  CREATININE 1.82* 1.28*  CALCIUM 9.0 8.6*   Liver Function Tests:  Recent Labs Lab 04/18/15 0154 04/19/15 0520  AST 211* 167*  ALT 156* 149*  ALKPHOS 147* 123  BILITOT 0.8 0.9  PROT 6.8 6.1*  ALBUMIN 2.7* 2.5*    Recent Labs Lab 04/18/15 0154  LIPASE 20   No results for input(s): AMMONIA in the last 168 hours.  Cardiac Enzymes: No results for input(s): CKTOTAL, CKMB, CKMBINDEX, TROPONINI in the last 168 hours.  BNP (last 3 results) No results for input(s): BNP in the last 8760 hours.  CBG:  Recent Labs Lab 04/18/15 1601 04/18/15 2219 04/19/15 0746 04/19/15 1136 04/19/15 1600  GLUCAP 147* 106* 142* 152* 135*    Recent Results (from the past 240 hour(s))  Urine culture     Status: None   Collection Time: 04/18/15  2:04 AM  Result Value Ref Range Status   Specimen Description URINE, CLEAN CATCH  Final   Special Requests NONE  Final   Culture   Final    NO GROWTH 1 DAY Performed at [facility]    Report Status 04/19/2015 FINAL  Final  C difficile quick scan w PCR reflex     Status: None   Collection Time: 04/18/15  5:38 AM  Result Value Ref Range Status   C Diff  antigen NEGATIVE NEGATIVE Final   C Diff toxin NEGATIVE NEGATIVE Final   C Diff interpretation Negative for toxigenic C. difficile  Final  Gastrointestinal Panel by PCR , Stool     Status: None   Collection Time: 04/18/15  5:38 AM  Result Value Ref Range Status   Campylobacter species NOT DETECTED NOT DETECTED Final   Plesimonas shigelloides NOT DETECTED NOT DETECTED Final   Salmonella species NOT DETECTED NOT DETECTED Final   Yersinia enterocolitica NOT DETECTED NOT DETECTED Final   Vibrio species NOT DETECTED NOT DETECTED Final   Vibrio cholerae NOT DETECTED NOT DETECTED Final   Enteroaggregative E coli (EAEC) NOT DETECTED NOT DETECTED Final   Enteropathogenic E coli (EPEC) NOT DETECTED NOT DETECTED Final   Enterotoxigenic E coli (ETEC) NOT DETECTED NOT  DETECTED Final   Shiga like toxin producing E coli (STEC) NOT DETECTED NOT DETECTED Final   E. coli O157 NOT DETECTED NOT DETECTED Final   Shigella/Enteroinvasive E coli (EIEC) NOT DETECTED NOT DETECTED Final   Cryptosporidium NOT DETECTED NOT DETECTED Final   Cyclospora cayetanensis NOT DETECTED NOT DETECTED Final   Entamoeba histolytica NOT DETECTED NOT DETECTED Final   Giardia lamblia NOT DETECTED NOT DETECTED Final   Adenovirus F40/41 NOT DETECTED NOT DETECTED Final   Astrovirus NOT DETECTED NOT DETECTED Final   Norovirus GI/GII NOT DETECTED NOT DETECTED Final   Rotavirus A NOT DETECTED NOT DETECTED Final   Sapovirus (I, II, IV, and V) NOT DETECTED NOT DETECTED Final     Studies: No results found.   Scheduled Meds: . aspirin  81 mg Oral Daily  . clopidogrel  75 mg Oral Daily  . colchicine  0.6 mg Oral Daily  . fish oil-omega-3 fatty acids  2 g Oral Daily  . insulin aspart  0-9 Units Subcutaneous TID WC  . isosorbide mononitrate  30 mg Oral Daily  . metoprolol tartrate  25 mg Oral BID   Continuous Infusions: . sodium chloride 100 mL/hr at 04/19/15 1804   PRN Meds: acetaminophen **OR** acetaminophen, loperamide,  methocarbamol, ondansetron **OR** ondansetron (ZOFRAN) IV  Time spent: 30 minutes  Author: Berle Mull, MD [facility] Pager: [phone] 04/19/2015 6:17 PM  If 7PM-7AM, please contact night-coverage at [email or web address], password [facility]

## 2015-04-19 NOTE — Progress Notes (Signed)
Patient would like something for generalized pain, MD notified awaiting callback Neta Mends RN 2:05 PM 04-19-2015

## 2015-04-20 DIAGNOSIS — R531 Weakness: Secondary | ICD-10-CM | POA: Diagnosis not present

## 2015-04-20 DIAGNOSIS — R7989 Other specified abnormal findings of blood chemistry: Secondary | ICD-10-CM | POA: Diagnosis not present

## 2015-04-20 DIAGNOSIS — I2510 Atherosclerotic heart disease of native coronary artery without angina pectoris: Secondary | ICD-10-CM

## 2015-04-20 DIAGNOSIS — N179 Acute kidney failure, unspecified: Secondary | ICD-10-CM | POA: Diagnosis not present

## 2015-04-20 DIAGNOSIS — D638 Anemia in other chronic diseases classified elsewhere: Secondary | ICD-10-CM | POA: Diagnosis not present

## 2015-04-20 LAB — CBC WITH DIFFERENTIAL/PLATELET
Basophils Absolute: 0 10*3/uL (ref 0.0–0.1)
Basophils Relative: 0 %
Eosinophils Absolute: 0.1 10*3/uL (ref 0.0–0.7)
Eosinophils Relative: 1 %
HCT: 25.6 % — ABNORMAL LOW (ref 39.0–52.0)
Hemoglobin: 9.3 g/dL — ABNORMAL LOW (ref 13.0–17.0)
Lymphocytes Relative: 17 %
Lymphs Abs: 1 10*3/uL (ref 0.7–4.0)
MCH: 30.8 pg (ref 26.0–34.0)
MCHC: 36.3 g/dL — ABNORMAL HIGH (ref 30.0–36.0)
MCV: 84.8 fL (ref 78.0–100.0)
Monocytes Absolute: 0.5 10*3/uL (ref 0.1–1.0)
Monocytes Relative: 8 %
Neutro Abs: 4.3 10*3/uL (ref 1.7–7.7)
Neutrophils Relative %: 74 %
Platelets: 57 10*3/uL — ABNORMAL LOW (ref 150–400)
RBC: 3.02 MIL/uL — ABNORMAL LOW (ref 4.22–5.81)
RDW: 15.6 % — ABNORMAL HIGH (ref 11.5–15.5)
WBC: 5.9 10*3/uL (ref 4.0–10.5)

## 2015-04-20 LAB — COMPREHENSIVE METABOLIC PANEL
ALT: 150 U/L — ABNORMAL HIGH (ref 17–63)
AST: 146 U/L — ABNORMAL HIGH (ref 15–41)
Albumin: 2.5 g/dL — ABNORMAL LOW (ref 3.5–5.0)
Alkaline Phosphatase: 110 U/L (ref 38–126)
Anion gap: 8 (ref 5–15)
BUN: 34 mg/dL — ABNORMAL HIGH (ref 6–20)
CO2: 18 mmol/L — ABNORMAL LOW (ref 22–32)
Calcium: 8.5 mg/dL — ABNORMAL LOW (ref 8.9–10.3)
Chloride: 113 mmol/L — ABNORMAL HIGH (ref 101–111)
Creatinine, Ser: 0.97 mg/dL (ref 0.61–1.24)
GFR calc Af Amer: >60 mL/min (ref >60)
GFR calc non Af Amer: >60 mL/min (ref >60)
Glucose, Bld: 108 mg/dL — ABNORMAL HIGH (ref 65–99)
Potassium: 3.9 mmol/L (ref 3.5–5.1)
Sodium: 139 mmol/L (ref 135–145)
Total Bilirubin: 1.2 mg/dL (ref 0.3–1.2)
Total Protein: 5.9 g/dL — ABNORMAL LOW (ref 6.5–8.1)

## 2015-04-20 LAB — GLUCOSE, CAPILLARY
Glucose-Capillary: 128 mg/dL — ABNORMAL HIGH (ref 65–99)
Glucose-Capillary: 121 mg/dL — ABNORMAL HIGH (ref 65–99)
Glucose-Capillary: 212 mg/dL — ABNORMAL HIGH (ref 65–99)
Glucose-Capillary: 235 mg/dL — ABNORMAL HIGH (ref 65–99)

## 2015-04-20 LAB — MAGNESIUM: Magnesium: 1.4 mg/dL — ABNORMAL LOW (ref 1.7–2.4)

## 2015-04-20 MED ORDER — PREDNISONE 50 MG PO TABS
50.0000 mg | ORAL_TABLET | Freq: Every day | ORAL | Status: DC
  Administered 2015-04-20 – 2015-04-22 (×3): 50 mg via ORAL
  Filled 2015-04-20 (×4): qty 1

## 2015-04-20 MED ORDER — TRAMADOL HCL 50 MG PO TABS
50.0000 mg | ORAL_TABLET | Freq: Four times a day (QID) | ORAL | Status: DC | PRN
  Administered 2015-04-20 – 2015-04-22 (×5): 50 mg via ORAL
  Filled 2015-04-20 (×5): qty 1

## 2015-04-20 NOTE — Progress Notes (Signed)
[facility] Progress Note  Patient: Paul [name] [id number]   PCP: Wyatt Haste, MD DOB: Jan 02, 1941   DOA: 04/18/2015   DOS: 04/20/2015   Date of Service: the patient was seen and examined on 04/20/2015  Subjective: Patient complains of pain in his right hand as well as right axilla. Also complains of bilateral foot pain. Does not have any other chest pain and abdominal pain nausea vomiting. Diarrhea has resolved Nutrition: Tolerating oral diet  Brief hospital course: Patient was admitted on 04/18/2015, with complaint of fall, was found to have acute kidney injury as well as acute elevation of LFT.. Currently further plan is continue hydration for AKI, use Imodium for diarrhea.  Assessment and Plan: 1. Weakness Likely secondary to dehydration from diarrhea.  continue aggressive IV hydration.  2. Diarrhea. GI pathogen panel as well as C. difficile is negative.  etiology is unclear. Possibly colchicine? We will use Imodium.  3. Acute kidney injury. Likely due to dehydration.  continue aggressive IV hydration.  4. Elevated LFT. Likely due to dehydration.  continue IV hydration and avoid hepatotoxic medication.  5. Recurrent gout.  the patient has been on colchicine on a regular basis and has been taking allopurinol on and off basis. Likely this intermittent use of allopurinol is causing the patient to have acute gout. muscle relaxant at present and Tylenol. As well as tramadol as needed.  6. Type II controlled diabetes mellitus. Continue sliding scale insulin.  7. Chronic thrombocytopenia. Platelet count currently are significantly low, no active bleeding reported. Discontinue colchicine and allopurinol as they both can cause thrombocytopenia. If the count continues to drift downwards we will discuss with hematology. We'll continue to closely monitor.  8. Metabolic acidosis without any anion gap. Likely related to IV fluids as well as diarrhea. We will  change the fluids to half normal saline  9. Coronary artery disease.  no active chest pain. Continue aspirin and Plavix. Of the count continues to remain low we will have her discuss with cardiology regarding antiplatelet medications although the patient has significant RCA obstruction not amenable to PCI.  Activity: physical therapy recommends SNF Bowel regimen: last BM 04/19/2015 DVT Prophylaxis: mechanical compression device. Nutrition: Regular diet Advance goals of care discussion: full code  HPI: As per the H and P dictated on admission, "75 yo WM with history of CAD with stent RCA, DM, bilateral carotid artery disease, HTN, HLD who presented to [facility] long hospital with reports of worsening weakness over past few days prior to this admission. Patient reports having diarrhea for last week and a half about 8-10 episodes of nonbloody diarrhea a day. He reports his by mouth intake was not as good although he was able to drink fluids and tried to stay hydrated. He noticed that over last few days he had difficulty ambulating and was extremely weak even when getting up from bed. He almost fell because he could not bear weight when getting up. Patient reports no previous history of weakness such as this. No reports of chest pain or palpitations or shortness of breath. No abdominal pain, nausea or vomiting. No blood in the stool. No fevers or chills. No lightheadedness. No loss of consciousness no reports of fall." Procedures: none Consultants: none Antibiotics: Anti-infectives    None      Family Communication: family was present at bedside, at the time of interview.  Opportunity was given to ask question and all questions were answered satisfactorily.   Disposition:  Expected discharge date:04/22/2015 Barriers to  safe discharge: Improvement in volume status and stabilization of the platelet count   Intake/Output Summary (Last 24 hours) at 04/20/15 1808 Last data filed at 04/20/15 1400   Gross per 24 hour  Intake   2520 ml  Output   2025 ml  Net    495 ml   Filed Weights   04/18/15 0835 04/19/15 0700 04/20/15 0617  Weight: 103.987 kg (229 lb 4 oz) 97.4 kg (214 lb 11.7 oz) 93.1 kg (205 lb 4 oz)    Objective: Physical Exam: Filed Vitals:   04/19/15 2245 04/20/15 0617 04/20/15 0923 04/20/15 1323  BP: 128/61 124/56 131/43 139/53  Pulse: 76 77  71  Temp: 98.6 F (37 C) 98 F (36.7 C)  97.9 F (36.6 C)  TempSrc: Oral Oral  Oral  Resp: 18 16  16   Height:      Weight:  93.1 kg (205 lb 4 oz)    SpO2: 96% 98%      General: Appear in mild distress, no Rash; Oral Mucosa moist. Cardiovascular: S1 and S2 Present, no Murmur, no JVD Respiratory: Bilateral Air entry present and Clear to Auscultation, no Crackles, no wheezes Abdomen: Bowel Sound persent, Soft and no tenderness Extremities: no Pedal edema, no calf tenderness  Data Reviewed: CBC:  Recent Labs Lab 04/18/15 0154 04/19/15 0520 04/20/15 0540  WBC 6.3 4.9 5.9  NEUTROABS  --   --  4.3  HGB 11.0* 10.0* 9.3*  HCT 31.6* 28.4* 25.6*  MCV 89.5 89.0 84.8  PLT 88* 70* 57*   Basic Metabolic Panel:  Recent Labs Lab 04/18/15 0154 04/19/15 0520 04/20/15 0540  NA 141 138 139  K 4.6 3.9 3.9  CL 113* 112* 113*  CO2 17* 17* 18*  GLUCOSE 163* 116* 108*  BUN 70* 52* 34*  CREATININE 1.82* 1.28* 0.97  CALCIUM 9.0 8.6* 8.5*  MG  --   --  1.4*   Liver Function Tests:  Recent Labs Lab 04/18/15 0154 04/19/15 0520 04/20/15 0540  AST 211* 167* 146*  ALT 156* 149* 150*  ALKPHOS 147* 123 110  BILITOT 0.8 0.9 1.2  PROT 6.8 6.1* 5.9*  ALBUMIN 2.7* 2.5* 2.5*    Recent Labs Lab 04/18/15 0154  LIPASE 20   No results for input(s): AMMONIA in the last 168 hours.  Cardiac Enzymes: No results for input(s): CKTOTAL, CKMB, CKMBINDEX, TROPONINI in the last 168 hours.  BNP (last 3 results) No results for input(s): BNP in the last 8760 hours.  CBG:  Recent Labs Lab 04/19/15 1600 04/19/15 2258  04/20/15 0833 04/20/15 1214 04/20/15 1643  GLUCAP 135* 103* 128* 121* 212*    Recent Results (from the past 240 hour(s))  Urine culture     Status: None   Collection Time: 04/18/15  2:04 AM  Result Value Ref Range Status   Specimen Description URINE, CLEAN CATCH  Final   Special Requests NONE  Final   Culture   Final    NO GROWTH 1 DAY Performed at [facility]    Report Status 04/19/2015 FINAL  Final  C difficile quick scan w PCR reflex     Status: None   Collection Time: 04/18/15  5:38 AM  Result Value Ref Range Status   C Diff antigen NEGATIVE NEGATIVE Final   C Diff toxin NEGATIVE NEGATIVE Final   C Diff interpretation Negative for toxigenic C. difficile  Final  Gastrointestinal Panel by PCR , Stool     Status: None   Collection Time: 04/18/15  5:38 AM  Result Value Ref Range Status   Campylobacter species NOT DETECTED NOT DETECTED Final   Plesimonas shigelloides NOT DETECTED NOT DETECTED Final   Salmonella species NOT DETECTED NOT DETECTED Final   Yersinia enterocolitica NOT DETECTED NOT DETECTED Final   Vibrio species NOT DETECTED NOT DETECTED Final   Vibrio cholerae NOT DETECTED NOT DETECTED Final   Enteroaggregative E coli (EAEC) NOT DETECTED NOT DETECTED Final   Enteropathogenic E coli (EPEC) NOT DETECTED NOT DETECTED Final   Enterotoxigenic E coli (ETEC) NOT DETECTED NOT DETECTED Final   Shiga like toxin producing E coli (STEC) NOT DETECTED NOT DETECTED Final   E. coli O157 NOT DETECTED NOT DETECTED Final   Shigella/Enteroinvasive E coli (EIEC) NOT DETECTED NOT DETECTED Final   Cryptosporidium NOT DETECTED NOT DETECTED Final   Cyclospora cayetanensis NOT DETECTED NOT DETECTED Final   Entamoeba histolytica NOT DETECTED NOT DETECTED Final   Giardia lamblia NOT DETECTED NOT DETECTED Final   Adenovirus F40/41 NOT DETECTED NOT DETECTED Final   Astrovirus NOT DETECTED NOT DETECTED Final   Norovirus GI/GII NOT DETECTED NOT DETECTED Final   Rotavirus A NOT  DETECTED NOT DETECTED Final   Sapovirus (I, II, IV, and V) NOT DETECTED NOT DETECTED Final     Studies: No results found.   Scheduled Meds: . aspirin  81 mg Oral Daily  . clopidogrel  75 mg Oral Daily  . fish oil-omega-3 fatty acids  2 g Oral Daily  . insulin aspart  0-9 Units Subcutaneous TID WC  . isosorbide mononitrate  30 mg Oral Daily  . metoprolol tartrate  25 mg Oral BID  . predniSONE  50 mg Oral Q breakfast   Continuous Infusions: . sodium chloride 100 mL/hr at 04/19/15 1804   PRN Meds: acetaminophen **OR** acetaminophen, loperamide, ondansetron **OR** ondansetron (ZOFRAN) IV, traMADol  Time spent: 30 minutes  Author: Berle Mull, MD [facility] Pager: [phone] 04/20/2015 6:08 PM  If 7PM-7AM, please contact night-coverage at [email or web address], password [facility]

## 2015-04-21 DIAGNOSIS — D638 Anemia in other chronic diseases classified elsewhere: Secondary | ICD-10-CM | POA: Diagnosis not present

## 2015-04-21 DIAGNOSIS — R531 Weakness: Secondary | ICD-10-CM | POA: Diagnosis not present

## 2015-04-21 DIAGNOSIS — N179 Acute kidney failure, unspecified: Secondary | ICD-10-CM | POA: Diagnosis not present

## 2015-04-21 DIAGNOSIS — R7989 Other specified abnormal findings of blood chemistry: Secondary | ICD-10-CM | POA: Diagnosis not present

## 2015-04-21 LAB — CBC WITH DIFFERENTIAL/PLATELET
Basophils Absolute: 0 10*3/uL (ref 0.0–0.1)
Basophils Relative: 0 %
Eosinophils Absolute: 0 10*3/uL (ref 0.0–0.7)
Eosinophils Relative: 0 %
HCT: 25.2 % — ABNORMAL LOW (ref 39.0–52.0)
Hemoglobin: 8.9 g/dL — ABNORMAL LOW (ref 13.0–17.0)
Lymphocytes Relative: 16 %
Lymphs Abs: 0.8 10*3/uL (ref 0.7–4.0)
MCH: 31 pg (ref 26.0–34.0)
MCHC: 35.3 g/dL (ref 30.0–36.0)
MCV: 87.8 fL (ref 78.0–100.0)
Monocytes Absolute: 0.3 10*3/uL (ref 0.1–1.0)
Monocytes Relative: 5 %
Neutro Abs: 4.1 10*3/uL (ref 1.7–7.7)
Neutrophils Relative %: 79 %
Platelets: 51 10*3/uL — ABNORMAL LOW (ref 150–400)
RBC: 2.87 MIL/uL — ABNORMAL LOW (ref 4.22–5.81)
RDW: 15.5 % (ref 11.5–15.5)
WBC: 5.2 10*3/uL (ref 4.0–10.5)

## 2015-04-21 LAB — COMPREHENSIVE METABOLIC PANEL
ALT: 124 U/L — ABNORMAL HIGH (ref 17–63)
AST: 93 U/L — ABNORMAL HIGH (ref 15–41)
Albumin: 2.6 g/dL — ABNORMAL LOW (ref 3.5–5.0)
Alkaline Phosphatase: 99 U/L (ref 38–126)
Anion gap: 9 (ref 5–15)
BUN: 26 mg/dL — ABNORMAL HIGH (ref 6–20)
CO2: 19 mmol/L — ABNORMAL LOW (ref 22–32)
Calcium: 8.7 mg/dL — ABNORMAL LOW (ref 8.9–10.3)
Chloride: 110 mmol/L (ref 101–111)
Creatinine, Ser: 0.83 mg/dL (ref 0.61–1.24)
GFR calc Af Amer: >60 mL/min (ref >60)
GFR calc non Af Amer: >60 mL/min (ref >60)
Glucose, Bld: 144 mg/dL — ABNORMAL HIGH (ref 65–99)
Potassium: 4 mmol/L (ref 3.5–5.1)
Sodium: 138 mmol/L (ref 135–145)
Total Bilirubin: 0.8 mg/dL (ref 0.3–1.2)
Total Protein: 5.8 g/dL — ABNORMAL LOW (ref 6.5–8.1)

## 2015-04-21 LAB — LACTATE DEHYDROGENASE: LDH: 166 U/L (ref 98–192)

## 2015-04-21 LAB — FOLATE: Folate: 17.9 ng/mL (ref >5.9)

## 2015-04-21 LAB — GLUCOSE, CAPILLARY
Glucose-Capillary: 99 mg/dL (ref 65–99)
Glucose-Capillary: 183 mg/dL — ABNORMAL HIGH (ref 65–99)
Glucose-Capillary: 157 mg/dL — ABNORMAL HIGH (ref 65–99)
Glucose-Capillary: 179 mg/dL — ABNORMAL HIGH (ref 65–99)
Glucose-Capillary: 195 mg/dL — ABNORMAL HIGH (ref 65–99)
Glucose-Capillary: 236 mg/dL — ABNORMAL HIGH (ref 65–99)

## 2015-04-21 LAB — TSH: TSH: 0.438 u[IU]/mL (ref 0.350–4.500)

## 2015-04-21 LAB — RETICULOCYTES
RBC.: 2.87 MIL/uL — ABNORMAL LOW (ref 4.22–5.81)
Retic Count, Absolute: 20.1 10*3/uL (ref 19.0–186.0)
Retic Ct Pct: 0.7 % (ref 0.4–3.1)

## 2015-04-21 LAB — VITAMIN B12: Vitamin B-12: 153 pg/mL — ABNORMAL LOW (ref 180–914)

## 2015-04-21 MED ORDER — VITAMIN B-12 1000 MCG PO TABS
1000.0000 ug | ORAL_TABLET | Freq: Every day | ORAL | Status: DC
  Administered 2015-04-21 – 2015-04-22 (×2): 1000 ug via ORAL
  Filled 2015-04-21 (×3): qty 1

## 2015-04-21 MED ORDER — ZOLPIDEM TARTRATE 5 MG PO TABS
5.0000 mg | ORAL_TABLET | Freq: Every evening | ORAL | Status: DC | PRN
  Administered 2015-04-21: 5 mg via ORAL
  Filled 2015-04-21: qty 1

## 2015-04-21 MED ORDER — CYANOCOBALAMIN 1000 MCG/ML IJ SOLN
1000.0000 ug | Freq: Once | INTRAMUSCULAR | Status: AC
  Administered 2015-04-21: 1000 ug via SUBCUTANEOUS
  Filled 2015-04-21 (×2): qty 1

## 2015-04-21 MED ORDER — FUROSEMIDE 20 MG PO TABS
20.0000 mg | ORAL_TABLET | Freq: Once | ORAL | Status: AC
  Administered 2015-04-21: 20 mg via ORAL
  Filled 2015-04-21: qty 1

## 2015-04-21 NOTE — Clinical Social Work Placement (Signed)
   CLINICAL SOCIAL WORK PLACEMENT  NOTE  Date:  04/21/2015  Patient Details  Name: SERENITY RAPA MRN: [id number] Date of Birth: 11-22-1940  Clinical Social Work is seeking post-discharge placement for this patient at the [place] level of care (*CSW will initial, date and re-position this form in  chart as items are completed):  Yes   Patient/family provided with [place] Work Department's list of facilities offering this level of care within the geographic area requested by the patient (or if unable, by the patient's family).  Yes   Patient/family informed of their freedom to choose among providers that offer the needed level of care, that participate in Medicare, Medicaid or managed care program needed by the patient, have an available bed and are willing to accept the patient.  Yes   Patient/family informed of 's ownership interest in [facility] and [facility], as well as of the fact that they are under no obligation to receive care at these facilities.  PASRR submitted to EDS on 04/21/15     PASRR number received on 04/21/15     Existing PASRR number confirmed on       FL2 transmitted to all facilities in geographic area requested by pt/family on 04/21/15     FL2 transmitted to all facilities within larger geographic area on 04/21/15     Patient informed that his/her managed care company has contracts with or will negotiate with certain facilities, including the following:            Patient/family informed of bed offers received.  Patient chooses bed at       Physician recommends and patient chooses bed at      Patient to be transferred to   on  .  Patient to be transferred to facility by       Patient family notified on   of transfer.  Name of family member notified:        PHYSICIAN       Additional Comment:    _______________________________________________ Harlon Flor, Student-SW 04/21/2015,  11:09 AM

## 2015-04-21 NOTE — Progress Notes (Signed)
04/21/15 1750: Pt sister here and asking to speak to Education officer, museum. Attempted to reach Harlon Flor and Alison Murray; Voicemail left for each.--Donne Hazel, RN

## 2015-04-21 NOTE — Progress Notes (Cosign Needed)
BSW Intern met with pt and pt wife at bedside to provide bed offers. Pt states his sister will be by today and will research bed offers with pt wife. BSW Intern suggested picking top 3 SNF choices and BSW Intern will contact each facility regarding any private rooms available.    Pt inquired about possible dc date. BSW Intern instructed pt to ask MD tomorrow.   Pt and pt wife thanked Engineer, water for help.  BSW Intern to follow-up tomorrow regarding pt decision.   CSW continuing to follow.  Harlon Flor, [place] Intern Clinical Social Work Department  [phone]

## 2015-04-21 NOTE — Progress Notes (Signed)
I rounded on pt, he was awake, and I  asked if how he was, and he stated that he "just did not feel right".  He denied being in pain. He was alert, able to answer my questions.correctly.  I took VS, they were WNL.  CBG was in the 180's. Told pt if he started to feel worse to call me on the call bell. Will continue to monitor.

## 2015-04-21 NOTE — Clinical Social Work Note (Signed)
Clinical Social Work Assessment  Patient Details  Name: Paul [name] MRN: [id number] Date of Birth: 1940-11-24  Date of referral:  04/21/15               Reason for consult:  Facility Placement                Permission sought to share information with:  Facility Sport and exercise psychologist, Family Supports Permission granted to share information::  Yes, Verbal Permission Granted  Name::     Dilan Novosad  Agency::  [facility] SNF search  Relationship::  Spouse  Contact Information:     Housing/Transportation Living arrangements for the past 2 months:  [place] of Information:  Patient Patient Interpreter Needed:  None Criminal Activity/Legal Involvement Pertinent to Current Situation/Hospitalization:  No - Comment as needed Significant Relationships:  Spouse Lives with:  Spouse Do you feel safe going back to the place where you live?  Yes Need for family participation in patient care:  No (Coment)  Care giving concerns:  Pt admitted from home with spouse. PT is recommending short-term rehab at a SNF.   Social Worker assessment / plan:  CSW received consult for SNF placement. BSW Intern met with pt and pt wife at bedside. BSW Intern introduced self and explained role. Pt reports he lives at home with his wife. BSW Intern discussed PT recommendation for rehab at SNF. Pt does not feel he can be taken care of at home at this time. Pt agreeable to [facility] search. Pt and pt wife requesting facility close to their home and a private room.   Employment status:  Retired Nurse, adult PT Recommendations:  [place] / Referral to community resources:  [place]  Patient/Family's Response to care:  Pt alert and oriented x4. Pt complained of little sleep in the hospital due to everyone coming in his room and all of the noise. Pt agrees short-term rehab is the best option for him right now  due to his weakness. Pt wife involved and supportive in pt care.  Patient/Family's Understanding of and Emotional Response to Diagnosis, Current Treatment, and Prognosis:  Pt and pt wife understanding of his treatment needed at this time.   Emotional Assessment Appearance:  Appears stated age Attitude/Demeanor/Rapport:  Complaining (Appropriate) Affect (typically observed):  Accepting, Adaptable, Frustrated Orientation:  Oriented to Self, Oriented to Place, Oriented to Situation, Oriented to  Time Alcohol / Substance use:  Not Applicable Psych involvement (Current and /or in the community):  No (Comment)  Discharge Needs  Concerns to be addressed:  Care Coordination Readmission within the last 30 days:  No Current discharge risk:  None Barriers to Discharge:  Continued Medical Work up   [employer], Student-SW 04/21/2015, 10:58 AM

## 2015-04-21 NOTE — Progress Notes (Signed)
[facility] Progress Note  Patient: Paul [name] [id number]   PCP: Wyatt Haste, MD DOB: 12-14-1940   DOA: 04/18/2015   DOS: 04/21/2015   Date of Service: the patient was seen and examined on 04/21/2015  Subjective: Patient appears to have significant weakness. Denies having any chest pain. Pain in the hand has been improving. No diarrhea. Nutrition: Tolerating oral diet  Brief hospital course: Patient was admitted on 04/18/2015, with complaint of fall, was found to have acute kidney injury as well as acute elevation of LFT. Currently further plan is continue continue B-12 supplementation. SNF arrangement for rehabilitation  Assessment and Plan: 1. Weakness  Vitamin B-12 deficiency Etiology of the B-12 deficiency is unclear. Current presentation with sudden weakness is likely secondary to dehydration from diarrhea.  I will replace B-12 subcutaneously and will place the patient on oral B-12 supplementation. Recommend a recheck in 1 month  2. Diarrhea. GI pathogen panel as well as C. difficile is negative.  etiology is unclear. Possibly colchicine? We will use Imodium.  3. Acute kidney injury. Likely due to dehydration.  Improved significantly.  4. Elevated LFT. Likely due to dehydration.  avoid hepatotoxic medication.  5. Recurrent gout.  the patient has been on colchicine on a regular basis and has been taking allopurinol on and off basis. Likely this intermittent use of allopurinol is causing the patient to have acute gout. His continue allopurinol and colchicine both and start the patient on oral prednisone muscle relaxant at present and Tylenol. As well as tramadol as needed.  6. Type II controlled diabetes mellitus. Continue sliding scale insulin.  7. Chronic thrombocytopenia. Platelet count currently are significantly low, no active bleeding reported. Discontinue colchicine and allopurinol as they both can cause thrombocytopenia. At present  platelet counts are stabilizing. Therefore will refer patient for outpatient hematology consultation  8. Metabolic acidosis without any anion gap. Likely related to IV fluids as well as diarrhea. Improving.  9. Coronary artery disease.  no active chest pain. Continue aspirin and Plavix. Of the count continues to remain low we will have her discuss with cardiology regarding antiplatelet medications although the patient has significant RCA obstruction not amenable to PCI.  10. Pedal edema. Volume overload. I will give the patient oral Lasix 20 mg 1.  Activity: physical therapy recommends SNF Bowel regimen: last BM 04/19/2015 DVT Prophylaxis: mechanical compression device. Nutrition: Regular diet Advance goals of care discussion: full code  HPI: As per the H and P dictated on admission, "75 yo WM with history of CAD with stent RCA, DM, bilateral carotid artery disease, HTN, HLD who presented [facility] with reports of worsening weakness over past few days prior to this admission. Patient reports having diarrhea for last week and a half about 8-10 episodes of nonbloody diarrhea a day. He reports his by mouth intake was not as good although he was able to drink fluids and tried to stay hydrated. He noticed that over last few days he had difficulty ambulating and was extremely weak even when getting up from bed. He almost fell because he could not bear weight when getting up. Patient reports no previous history of weakness such as this. No reports of chest pain or palpitations or shortness of breath. No abdominal pain, nausea or vomiting. No blood in the stool. No fevers or chills. No lightheadedness. No loss of consciousness no reports of fall." Procedures: none Consultants: Phone consultation with hematology Antibiotics: Anti-infectives    None  Family Communication: family was present at bedside, at the time of interview.  Opportunity was given to ask question and all  questions were answered satisfactorily.   Disposition:  Expected discharge date:04/22/2015 Barriers to safe discharge: Improvement in volume status and stabilization of the platelet count   Intake/Output Summary (Last 24 hours) at 04/21/15 2027 Last data filed at 04/21/15 1800  Gross per 24 hour  Intake   2960 ml  Output   2225 ml  Net    735 ml   Filed Weights   04/18/15 0835 04/19/15 0700 04/20/15 0617  Weight: 103.987 kg (229 lb 4 oz) 97.4 kg (214 lb 11.7 oz) 93.1 kg (205 lb 4 oz)    Objective: Physical Exam: Filed Vitals:   04/21/15 0105 04/21/15 0637 04/21/15 0925 04/21/15 1332  BP: 153/71 142/60 144/63 148/51  Pulse: 70 58 66 63  Temp:  97.9 F (36.6 C) 98 F (36.7 C) 98 F (36.7 C)  TempSrc:  Oral Oral Oral  Resp: 20 18 18 18   Height:      Weight:      SpO2:  98% 99% 99%    General: Appear in mild distress, no Rash; Oral Mucosa moist. Cardiovascular: S1 and S2 Present, no Murmur, no JVD Respiratory: Bilateral Air entry present and Clear to Auscultation, no Crackles, no wheezes Abdomen: Bowel Sound persent, Soft and no tenderness Extremities: Bilateral Pedal edema, no calf tenderness  Data Reviewed: CBC:  Recent Labs Lab 04/18/15 0154 04/19/15 0520 04/20/15 0540 04/21/15 0430  WBC 6.3 4.9 5.9 5.2  NEUTROABS  --   --  4.3 4.1  HGB 11.0* 10.0* 9.3* 8.9*  HCT 31.6* 28.4* 25.6* 25.2*  MCV 89.5 89.0 84.8 87.8  PLT 88* 70* 57* 51*   Basic Metabolic Panel:  Recent Labs Lab 04/18/15 0154 04/19/15 0520 04/20/15 0540 04/21/15 0430  NA 141 138 139 138  K 4.6 3.9 3.9 4.0  CL 113* 112* 113* 110  CO2 17* 17* 18* 19*  GLUCOSE 163* 116* 108* 144*  BUN 70* 52* 34* 26*  CREATININE 1.82* 1.28* 0.97 0.83  CALCIUM 9.0 8.6* 8.5* 8.7*  MG  --   --  1.4*  --    Liver Function Tests:  Recent Labs Lab 04/18/15 0154 04/19/15 0520 04/20/15 0540 04/21/15 0430  AST 211* 167* 146* 93*  ALT 156* 149* 150* 124*  ALKPHOS 147* 123 110 99  BILITOT 0.8 0.9 1.2  0.8  PROT 6.8 6.1* 5.9* 5.8*  ALBUMIN 2.7* 2.5* 2.5* 2.6*    Recent Labs Lab 04/18/15 0154  LIPASE 20   No results for input(s): AMMONIA in the last 168 hours.  Cardiac Enzymes: No results for input(s): CKTOTAL, CKMB, CKMBINDEX, TROPONINI in the last 168 hours.  BNP (last 3 results) No results for input(s): BNP in the last 8760 hours.  CBG:  Recent Labs Lab 04/20/15 2211 04/21/15 0108 04/21/15 0750 04/21/15 1201 04/21/15 1647  GLUCAP 235* 183* 157* 179* 195*    Recent Results (from the past 240 hour(s))  Urine culture     Status: None   Collection Time: 04/18/15  2:04 AM  Result Value Ref Range Status   Specimen Description URINE, CLEAN CATCH  Final   Special Requests NONE  Final   Culture   Final    NO GROWTH 1 DAY Performed at [facility]    Report Status 04/19/2015 FINAL  Final  C difficile quick scan w PCR reflex     Status: None  Collection Time: 04/18/15  5:38 AM  Result Value Ref Range Status   C Diff antigen NEGATIVE NEGATIVE Final   C Diff toxin NEGATIVE NEGATIVE Final   C Diff interpretation Negative for toxigenic C. difficile  Final  Gastrointestinal Panel by PCR , Stool     Status: None   Collection Time: 04/18/15  5:38 AM  Result Value Ref Range Status   Campylobacter species NOT DETECTED NOT DETECTED Final   Plesimonas shigelloides NOT DETECTED NOT DETECTED Final   Salmonella species NOT DETECTED NOT DETECTED Final   Yersinia enterocolitica NOT DETECTED NOT DETECTED Final   Vibrio species NOT DETECTED NOT DETECTED Final   Vibrio cholerae NOT DETECTED NOT DETECTED Final   Enteroaggregative E coli (EAEC) NOT DETECTED NOT DETECTED Final   Enteropathogenic E coli (EPEC) NOT DETECTED NOT DETECTED Final   Enterotoxigenic E coli (ETEC) NOT DETECTED NOT DETECTED Final   Shiga like toxin producing E coli (STEC) NOT DETECTED NOT DETECTED Final   E. coli O157 NOT DETECTED NOT DETECTED Final   Shigella/Enteroinvasive E coli (EIEC) NOT DETECTED  NOT DETECTED Final   Cryptosporidium NOT DETECTED NOT DETECTED Final   Cyclospora cayetanensis NOT DETECTED NOT DETECTED Final   Entamoeba histolytica NOT DETECTED NOT DETECTED Final   Giardia lamblia NOT DETECTED NOT DETECTED Final   Adenovirus F40/41 NOT DETECTED NOT DETECTED Final   Astrovirus NOT DETECTED NOT DETECTED Final   Norovirus GI/GII NOT DETECTED NOT DETECTED Final   Rotavirus A NOT DETECTED NOT DETECTED Final   Sapovirus (I, II, IV, and V) NOT DETECTED NOT DETECTED Final     Studies: No results found.   Scheduled Meds: . aspirin  81 mg Oral Daily  . clopidogrel  75 mg Oral Daily  . fish oil-omega-3 fatty acids  2 g Oral Daily  . insulin aspart  0-9 Units Subcutaneous TID WC  . isosorbide mononitrate  30 mg Oral Daily  . metoprolol tartrate  25 mg Oral BID  . predniSONE  50 mg Oral Q breakfast  . vitamin B-12  1,000 mcg Oral Daily   Continuous Infusions:   PRN Meds: acetaminophen **OR** acetaminophen, loperamide, ondansetron **OR** ondansetron (ZOFRAN) IV, traMADol  Time spent: 30 minutes  Author: Berle Mull, MD [facility] Pager: [phone] 04/21/2015 8:27 PM  If 7PM-7AM, please contact night-coverage at [email or web address], password [facility]

## 2015-04-21 NOTE — Progress Notes (Signed)
Physical Therapy Treatment Patient Details Name: Paul [name] MRN: [id number] DOB: 10-02-1940 Today's Date: 04/21/2015    History of Present Illness 75 yo male with history of back surgery x 4 (fell off 4th floor roof), CAD with stent RCA, DM, bilateral carotid artery disease, HTN, HLD and admitted with diarrhea, weakness, dehydration    PT Comments    Sit to stand x 2 from elevated bed with min assist and verbal cues for safe technique. Min assist for pivot from bed to recliner with RW.  Pt only able to stand for approximately 30 seconds due to back pain and fatigue. Instructed pt in BLE strengthening exercises.   Follow Up Recommendations  Supervision/Assistance - 24 hour;SNF     Equipment Recommendations  3in1 (PT)    Recommendations for Other Services       Precautions / Restrictions Precautions Precautions: Fall Precaution Comments: fell PTA Restrictions Weight Bearing Restrictions: No    Mobility  Bed Mobility               General bed mobility comments: up on EOB  Transfers Overall transfer level: Needs assistance Equipment used: Rolling walker (2 wheeled) Transfers: Sit to/from Stand Sit to Stand: From elevated surface;+2 safety/equipment;Min assist         General transfer comment: verbal cues for safe technique, assist to rise and steady  Ambulation/Gait Ambulation/Gait assistance: Min guard;+2 safety/equipment Ambulation Distance (Feet): 3 Feet Assistive device: Rolling walker (2 wheeled) Gait Pattern/deviations: Step-to pattern;Decreased step length - right;Decreased step length - left     General Gait Details: pivotal steps bed to recliner, distance limited by LBP and fatigue, +2 for safety   Stairs            Wheelchair Mobility    Modified Rankin (Stroke Patients Only)       Balance                                    Cognition Arousal/Alertness: Awake/alert Behavior During Therapy: WFL for tasks  assessed/performed Overall Cognitive Status: Within Functional Limits for tasks assessed                      Exercises General Exercises - Lower Extremity Ankle Circles/Pumps: AROM;Both;10 reps Quad Sets: AROM;Both;5 reps;Supine Gluteal Sets: AROM;Both;5 reps;Supine Long Arc Quad: AROM;Both;10 reps;Seated Hip Flexion/Marching: AROM;Both;5 reps;Seated    General Comments        Pertinent Vitals/Pain Pain Assessment: 0-10 Pain Score: 7  Pain Location: back Pain Descriptors / Indicators: Aching Pain Intervention(s): Premedicated before session    Home Living                      Prior Function            PT Goals (current goals can now be found in the care plan section) Acute Rehab PT Goals PT Goal Formulation: With patient/family Time For Goal Achievement: 04/25/15 Potential to Achieve Goals: Good Progress towards PT goals: Progressing toward goals    Frequency  Min 3X/week    PT Plan Current plan remains appropriate    Co-evaluation             End of Session Equipment Utilized During Treatment: Gait belt Activity Tolerance: Patient limited by fatigue (pt self limited mobility) Patient left: with call bell/phone within reach;with family/visitor present;in chair;with chair alarm set     Time: [phone]  PT Time Calculation (min) (ACUTE ONLY): 29 min  Charges:  $Therapeutic Exercise: 8-22 mins $Therapeutic Activity: 8-22 mins                    G Codes:      Philomena Doheny 04/21/2015, 10:13 AM [phone]

## 2015-04-21 NOTE — NC FL2 (Signed)
[place] LEVEL OF CARE SCREENING TOOL     IDENTIFICATION  Patient Name: Paul [name] Birthdate: 1940-09-05 Sex: male Admission Date (Current Location): 04/18/2015  [facility] and [place] Number:  Herbalist and Address:  [place],  [place] [address], [place]      Provider Number: [id number]  Attending Physician Name and Address:  Lavina Hamman, MD  Relative Name and Phone Number:       Current Level of Care: Hospital Recommended Level of Care: [place] Prior Approval Number:    Date Approved/Denied:   PASRR Number: [id number]  Discharge Plan: SNF    Current Diagnoses: Patient Active Problem List   Diagnosis Date Noted  . Weakness 04/18/2015  . AKI (acute kidney injury) ([place]) 04/18/2015  . Dehydration 04/18/2015  . Diarrhea 04/18/2015  . Controlled type 2 diabetes mellitus without complication, without long-term current use of insulin ([place]) 04/18/2015  . Thrombocytopenia ([place]) 04/18/2015  . Anemia of chronic disease 04/18/2015  . Abnormal LFTs 04/18/2015  . Gout 07/22/2010  . Coronary atherosclerosis of native coronary artery 09/17/2008    Orientation RESPIRATION BLADDER Height & Weight     Self, Time, Situation, Place  Normal Continent Weight: 205 lb 4 oz (93.1 kg) Height:  5\' 10"  (177.8 cm)  BEHAVIORAL SYMPTOMS/MOOD NEUROLOGICAL BOWEL NUTRITION STATUS  Other (Comment) (None)  (N/a) Continent Diet (Diet regular Room service appropriate; Fluid consistency: Thin)  AMBULATORY STATUS COMMUNICATION OF NEEDS Skin   Extensive Assist Verbally Normal                       Personal Care Assistance Level of Assistance  Bathing, Dressing, Feeding Bathing Assistance: Limited assistance Feeding assistance: Independent Dressing Assistance: Limited assistance     Functional Limitations Info  Sight, Hearing, Speech Sight Info: Adequate Hearing Info: Adequate Speech Info: Adequate    SPECIAL  CARE FACTORS FREQUENCY  PT (By licensed PT), OT (By licensed OT)     PT Frequency: 5x week OT Frequency: 5x week            Contractures Contractures Info: Not present    Additional Factors Info  Code Status, Allergies Code Status Info: FULL Allergies Info: No Known Allergies           Current Medications (04/21/2015):  This is the current hospital active medication list Current Facility-Administered Medications  Medication Dose Route Frequency Provider Last Rate Last Dose  . acetaminophen (TYLENOL) tablet 650 mg  650 mg Oral Q6H PRN Robbie Lis, MD       Or  . acetaminophen (TYLENOL) suppository 650 mg  650 mg Rectal Q6H PRN Robbie Lis, MD      . aspirin chewable tablet 81 mg  81 mg Oral Daily Robbie Lis, MD   81 mg at 04/21/15 1009  . clopidogrel (PLAVIX) tablet 75 mg  75 mg Oral Daily Robbie Lis, MD   75 mg at 04/21/15 1009  . fish oil-omega-3 fatty acids capsule 2 g  2 g Oral Daily Robbie Lis, MD   2 g at 04/20/15 1000  . insulin aspart (novoLOG) injection 0-9 Units  0-9 Units Subcutaneous TID WC Robbie Lis, MD   2 Units at 04/21/15 [phone]  . isosorbide mononitrate (IMDUR) 24 hr tablet 30 mg  30 mg Oral Daily Robbie Lis, MD   30 mg at 04/21/15 1009  . loperamide (IMODIUM) capsule 2 mg  2  mg Oral PRN Lavina Hamman, MD      . metoprolol tartrate (LOPRESSOR) tablet 25 mg  25 mg Oral BID Robbie Lis, MD   25 mg at 04/21/15 1009  . ondansetron (ZOFRAN) tablet 4 mg  4 mg Oral Q6H PRN Robbie Lis, MD       Or  . ondansetron [facility]) injection 4 mg  4 mg Intravenous Q6H PRN Robbie Lis, MD      . predniSONE (DELTASONE) tablet 50 mg  50 mg Oral Q breakfast Lavina Hamman, MD   50 mg at 04/21/15 0806  . traMADol (ULTRAM) tablet 50 mg  50 mg Oral Q6H PRN Lavina Hamman, MD   50 mg at 04/21/15 [id number]     Discharge Medications: Please see discharge summary for a list of discharge medications.  Relevant Imaging Results:  Relevant Lab  Results:   Additional Information SSN: [id number]  Harlon Flor, Student-SW [phone]

## 2015-04-21 NOTE — Progress Notes (Signed)
Inpatient Diabetes Program Recommendations  AACE/ADA: New Consensus Statement on Inpatient Glycemic Control (2015)  Target Ranges:  Prepandial:   less than 140 mg/dL      Peak postprandial:   less than 180 mg/dL (1-2 hours)      Critically ill patients:  140 - 180 mg/dL   Results for READE, GRATTON (MRN [id number]) as of 04/21/2015 10:29  Ref. Range 04/20/2015 08:33 04/20/2015 12:14 04/20/2015 16:43 04/20/2015 22:11  Glucose-Capillary Latest Ref Range: 65-99 mg/dL 128 (H) 121 (H) 212 (H) 235 (H)    Admit with: Diarrhea/ Weakness/ Dehydration  History: DM2  Home DM Meds: Metformin 1000 mg daily  Current Insulin Orders: Novolog Sensitive Correction Scale/ SSI (0-9 units) TID AC     MD- Note patient currently getting Prednisone 50 mg daily.  Having some elevated CBG readings.  Please consider increasing Novolog Correction Scale/ SSI to Moderate scale (0-15 units) TID AC + HS while patient getting Prednisone      --Will follow patient during hospitalization--  Wyn Quaker RN, MSN, CDE Diabetes Coordinator Inpatient Glycemic Control Team Team Pager: [phone] (8a-5p)

## 2015-04-21 NOTE — Care Management Important Message (Signed)
Important Message  Patient Details  Name: DEERIC KNAUF MRN: [id number] Date of Birth: 03-24-1940   Medicare Important Message Given:  Yes    Camillo Flaming 04/21/2015, 11:59 AMImportant Message  Patient Details  Name: MITCHEL BLACHE MRN: [id number] Date of Birth: [birth year]   Medicare Important Message Given:  Yes    Camillo Flaming 04/21/2015, 11:59 AM

## 2015-04-22 DIAGNOSIS — Z87891 Personal history of nicotine dependence: Secondary | ICD-10-CM | POA: Diagnosis not present

## 2015-04-22 DIAGNOSIS — Z7982 Long term (current) use of aspirin: Secondary | ICD-10-CM | POA: Diagnosis not present

## 2015-04-22 DIAGNOSIS — Z7984 Long term (current) use of oral hypoglycemic drugs: Secondary | ICD-10-CM | POA: Diagnosis not present

## 2015-04-22 DIAGNOSIS — E559 Vitamin D deficiency, unspecified: Secondary | ICD-10-CM | POA: Diagnosis not present

## 2015-04-22 DIAGNOSIS — E872 Acidosis: Secondary | ICD-10-CM | POA: Diagnosis not present

## 2015-04-22 DIAGNOSIS — E538 Deficiency of other specified B group vitamins: Secondary | ICD-10-CM | POA: Diagnosis present

## 2015-04-22 DIAGNOSIS — K5660 Unspecified intestinal obstruction: Secondary | ICD-10-CM | POA: Diagnosis not present

## 2015-04-22 DIAGNOSIS — Z862 Personal history of diseases of the blood and blood-forming organs and certain disorders involving the immune mechanism: Secondary | ICD-10-CM | POA: Diagnosis not present

## 2015-04-22 DIAGNOSIS — E1140 Type 2 diabetes mellitus with diabetic neuropathy, unspecified: Secondary | ICD-10-CM | POA: Diagnosis not present

## 2015-04-22 DIAGNOSIS — R197 Diarrhea, unspecified: Secondary | ICD-10-CM | POA: Diagnosis not present

## 2015-04-22 DIAGNOSIS — M6281 Muscle weakness (generalized): Secondary | ICD-10-CM | POA: Diagnosis not present

## 2015-04-22 DIAGNOSIS — G8929 Other chronic pain: Secondary | ICD-10-CM | POA: Diagnosis not present

## 2015-04-22 DIAGNOSIS — R7989 Other specified abnormal findings of blood chemistry: Secondary | ICD-10-CM | POA: Diagnosis not present

## 2015-04-22 DIAGNOSIS — Z7902 Long term (current) use of antithrombotics/antiplatelets: Secondary | ICD-10-CM | POA: Diagnosis not present

## 2015-04-22 DIAGNOSIS — R748 Abnormal levels of other serum enzymes: Secondary | ICD-10-CM | POA: Diagnosis not present

## 2015-04-22 DIAGNOSIS — D759 Disease of blood and blood-forming organs, unspecified: Secondary | ICD-10-CM | POA: Diagnosis not present

## 2015-04-22 DIAGNOSIS — Z89421 Acquired absence of other right toe(s): Secondary | ICD-10-CM | POA: Diagnosis not present

## 2015-04-22 DIAGNOSIS — D696 Thrombocytopenia, unspecified: Secondary | ICD-10-CM | POA: Diagnosis not present

## 2015-04-22 DIAGNOSIS — R600 Localized edema: Secondary | ICD-10-CM | POA: Diagnosis not present

## 2015-04-22 DIAGNOSIS — Z79899 Other long term (current) drug therapy: Secondary | ICD-10-CM | POA: Diagnosis not present

## 2015-04-22 DIAGNOSIS — D509 Iron deficiency anemia, unspecified: Secondary | ICD-10-CM | POA: Diagnosis not present

## 2015-04-22 DIAGNOSIS — K5900 Constipation, unspecified: Secondary | ICD-10-CM | POA: Diagnosis not present

## 2015-04-22 DIAGNOSIS — Z9889 Other specified postprocedural states: Secondary | ICD-10-CM | POA: Diagnosis not present

## 2015-04-22 DIAGNOSIS — M10041 Idiopathic gout, right hand: Secondary | ICD-10-CM | POA: Diagnosis not present

## 2015-04-22 DIAGNOSIS — R531 Weakness: Secondary | ICD-10-CM | POA: Diagnosis not present

## 2015-04-22 DIAGNOSIS — Z87448 Personal history of other diseases of urinary system: Secondary | ICD-10-CM | POA: Diagnosis not present

## 2015-04-22 DIAGNOSIS — M109 Gout, unspecified: Secondary | ICD-10-CM | POA: Diagnosis not present

## 2015-04-22 DIAGNOSIS — R339 Retention of urine, unspecified: Secondary | ICD-10-CM | POA: Diagnosis not present

## 2015-04-22 DIAGNOSIS — R109 Unspecified abdominal pain: Secondary | ICD-10-CM | POA: Diagnosis not present

## 2015-04-22 DIAGNOSIS — Z89411 Acquired absence of right great toe: Secondary | ICD-10-CM | POA: Diagnosis not present

## 2015-04-22 DIAGNOSIS — N179 Acute kidney failure, unspecified: Secondary | ICD-10-CM | POA: Diagnosis not present

## 2015-04-22 DIAGNOSIS — Z7952 Long term (current) use of systemic steroids: Secondary | ICD-10-CM | POA: Diagnosis not present

## 2015-04-22 DIAGNOSIS — D649 Anemia, unspecified: Secondary | ICD-10-CM | POA: Diagnosis not present

## 2015-04-22 DIAGNOSIS — Z5189 Encounter for other specified aftercare: Secondary | ICD-10-CM | POA: Diagnosis not present

## 2015-04-22 DIAGNOSIS — I2510 Atherosclerotic heart disease of native coronary artery without angina pectoris: Secondary | ICD-10-CM | POA: Diagnosis not present

## 2015-04-22 DIAGNOSIS — I10 Essential (primary) hypertension: Secondary | ICD-10-CM | POA: Diagnosis not present

## 2015-04-22 DIAGNOSIS — E860 Dehydration: Secondary | ICD-10-CM | POA: Diagnosis not present

## 2015-04-22 DIAGNOSIS — D638 Anemia in other chronic diseases classified elsewhere: Secondary | ICD-10-CM | POA: Diagnosis not present

## 2015-04-22 DIAGNOSIS — E119 Type 2 diabetes mellitus without complications: Secondary | ICD-10-CM | POA: Diagnosis not present

## 2015-04-22 DIAGNOSIS — E039 Hypothyroidism, unspecified: Secondary | ICD-10-CM | POA: Diagnosis not present

## 2015-04-22 DIAGNOSIS — I714 Abdominal aortic aneurysm, without rupture: Secondary | ICD-10-CM | POA: Diagnosis not present

## 2015-04-22 DIAGNOSIS — E785 Hyperlipidemia, unspecified: Secondary | ICD-10-CM | POA: Diagnosis not present

## 2015-04-22 LAB — COMPREHENSIVE METABOLIC PANEL
ALT: 116 U/L — ABNORMAL HIGH (ref 17–63)
AST: 83 U/L — ABNORMAL HIGH (ref 15–41)
Albumin: 2.6 g/dL — ABNORMAL LOW (ref 3.5–5.0)
Alkaline Phosphatase: 88 U/L (ref 38–126)
Anion gap: 9 (ref 5–15)
BUN: 32 mg/dL — ABNORMAL HIGH (ref 6–20)
CO2: 21 mmol/L — ABNORMAL LOW (ref 22–32)
Calcium: 8.7 mg/dL — ABNORMAL LOW (ref 8.9–10.3)
Chloride: 106 mmol/L (ref 101–111)
Creatinine, Ser: 0.95 mg/dL (ref 0.61–1.24)
GFR calc Af Amer: >60 mL/min (ref >60)
GFR calc non Af Amer: >60 mL/min (ref >60)
Glucose, Bld: 169 mg/dL — ABNORMAL HIGH (ref 65–99)
Potassium: 4 mmol/L (ref 3.5–5.1)
Sodium: 136 mmol/L (ref 135–145)
Total Bilirubin: 0.8 mg/dL (ref 0.3–1.2)
Total Protein: 5.8 g/dL — ABNORMAL LOW (ref 6.5–8.1)

## 2015-04-22 LAB — CBC WITH DIFFERENTIAL/PLATELET
Basophils Absolute: 0 10*3/uL (ref 0.0–0.1)
Basophils Relative: 0 %
Eosinophils Absolute: 0 10*3/uL (ref 0.0–0.7)
Eosinophils Relative: 1 %
HCT: 25.2 % — ABNORMAL LOW (ref 39.0–52.0)
Hemoglobin: 8.9 g/dL — ABNORMAL LOW (ref 13.0–17.0)
Lymphocytes Relative: 24 %
Lymphs Abs: 1.5 10*3/uL (ref 0.7–4.0)
MCH: 30.9 pg (ref 26.0–34.0)
MCHC: 35.3 g/dL (ref 30.0–36.0)
MCV: 87.5 fL (ref 78.0–100.0)
Monocytes Absolute: 0.6 10*3/uL (ref 0.1–1.0)
Monocytes Relative: 10 %
Neutro Abs: 4 10*3/uL (ref 1.7–7.7)
Neutrophils Relative %: 65 %
Platelets: 75 10*3/uL — ABNORMAL LOW (ref 150–400)
RBC: 2.88 MIL/uL — ABNORMAL LOW (ref 4.22–5.81)
RDW: 15.5 % (ref 11.5–15.5)
WBC: 6.1 10*3/uL (ref 4.0–10.5)

## 2015-04-22 LAB — BASIC METABOLIC PANEL
BUN: 32 mg/dL — AB (ref 4–21)
Creatinine: 1 mg/dL (ref 0.6–1.3)
Glucose: 169 mg/dL
Sodium: 136 mmol/L — AB (ref 137–147)

## 2015-04-22 LAB — HEPATIC FUNCTION PANEL: Bilirubin, Total: 0.8 mg/dL

## 2015-04-22 LAB — GLUCOSE, CAPILLARY
Glucose-Capillary: 197 mg/dL — ABNORMAL HIGH (ref 65–99)
Glucose-Capillary: 140 mg/dL — ABNORMAL HIGH (ref 65–99)

## 2015-04-22 LAB — CBC AND DIFFERENTIAL: WBC: 6.1 10^3/mL

## 2015-04-22 MED ORDER — TRAMADOL HCL 50 MG PO TABS: 50.0000 mg | ORAL_TABLET | Freq: Four times a day (QID) | ORAL | Status: DC | PRN

## 2015-04-22 MED ORDER — PREDNISONE 10 MG PO TABS: ORAL_TABLET | ORAL | Status: DC

## 2015-04-22 MED ORDER — FUROSEMIDE 20 MG PO TABS: 20.0000 mg | ORAL_TABLET | Freq: Every day | ORAL | Status: DC

## 2015-04-22 MED ORDER — FUROSEMIDE 20 MG PO TABS
20.0000 mg | ORAL_TABLET | Freq: Every day | ORAL | Status: DC
  Administered 2015-04-22: 20 mg via ORAL
  Filled 2015-04-22: qty 1

## 2015-04-22 NOTE — Progress Notes (Signed)
Patient is alert and oriented X4.  Patient left with EMS on stretcher to [facility].

## 2015-04-22 NOTE — Discharge Summary (Addendum)
[facility] Discharge Summary   Patient: Paul [name] [id number]   PCP: Wyatt Haste, MD DOB: 05-03-1940   Date of admission: 04/18/2015   Date of discharge:  04/22/2015    Discharge Diagnoses:  Principal Problem:   Weakness Active Problems:   Coronary atherosclerosis of native coronary artery   Gout   AKI (acute kidney injury) ([place])   Dehydration   Diarrhea   Controlled type 2 diabetes mellitus without complication, without long-term current use of insulin ([name])   Thrombocytopenia ([name])   Anemia of chronic disease   Abnormal LFTs   Vitamin B12 deficiency  Recommendations for Outpatient Follow-up:  1. Follow-up with PCP in one week, will need outpatient repeat B-12 levels  2. Discharging to SNF  Follow-up Information    Follow up with Wyatt Haste, MD. Schedule an appointment as soon as possible for a visit in 1 week.   Specialty:  Family Medicine   Contact information:   [place] [address] [phone]       Follow up with Volanda Napoleon, MD. Call in 1 week.   Specialty:  Oncology   Why:  for new referral for thrombocytopenia   Contact information:   [address], [address] [place] [address] [phone]      Diet recommendation: Cardiac and carb modified diet  Activity: The patient is advised to gradually reintroduce usual activities.  Discharge Condition: good  History of present illness: As per the H and P dictated on admission, "75 yo WM with history of CAD with stent RCA, DM, bilateral carotid artery disease, HTN, HLD who presented to [facility] long hospital with reports of worsening weakness over past few days prior to this admission. Patient reports having diarrhea for last week and a half about 8-10 episodes of nonbloody diarrhea a day. He reports his by mouth intake was not as good although he was able to drink fluids and tried to stay hydrated. He noticed that over last few days he had difficulty  ambulating and was extremely weak even when getting up from bed. He almost fell because he could not bear weight when getting up. Patient reports no previous history of weakness such as this. No reports of chest pain or palpitations or shortness of breath. No abdominal pain, nausea or vomiting. No blood in the stool. No fevers or chills. No lightheadedness. No loss of consciousness no reports of fall.  In ED, patient was hemodynamically stable. Blood work was significant for hemoglobin of 11, platelets 88, CO2 17, creatinine 1.82, glucose 163. His AST was 211, ALT 156, ALP 147. Bilirubin and lipase were within normal limits. Abdominal ultrasound showed normal right upper quadrant ultrasound."  Hospital Course:  Summary of his active problems in the hospital is as following. 1. Weakness  Vitamin B-12 deficiency Etiology of the B-12 deficiency is unclear. Current presentation with sudden worsening of his weakness is likely secondary to dehydration from diarrhea.  Patient got 1 dose of B-12 subcutaneously and will place the patient on oral B-12 supplementation. Recommend a recheck in 1 month  2. Diarrhea. GI pathogen panel as well as C. difficile is negative.  etiology is unclear. Possibly colchicine? We will use Imodium.  3. Acute kidney injury. Likely due to dehydration.  Improved significantly.  4. Elevated LFT. Likely due to dehydration.  avoid hepatotoxic medication.  5. Recurrent gout.  the patient has been on colchicine on a regular basis and has been taking allopurinol on and off basis. Likely this  intermittent use of allopurinol is causing the patient to have acute gout. Discontinue allopurinol and colchicine both and start the patient on oral prednisone. muscle relaxant at present and Tylenol. As well as tramadol as needed.  6. Type II controlled diabetes mellitus. Sugars mildly elevated, expect them to drop once the prednisone is tapered.  7. Chronic  thrombocytopenia. Platelet count currently are significantly low, no active bleeding reported. Discontinue colchicine and allopurinol as they both can cause thrombocytopenia. At present platelet counts are rapidly improving on steroids. Therefore will refer patient for outpatient hematology consultation  8. Metabolic acidosis without any anion gap. Likely related to IV fluids as well as diarrhea. Improving.  9. Coronary artery disease.  no active chest pain. Continue aspirin and Plavix. Of the count continues to remain low we will have her discuss with cardiology regarding antiplatelet medications although the patient has significant RCA obstruction not amenable to PCI.  10. Pedal edema. Volume overload. I will give the patient oral Lasix 20 mg for 1 week.  All other chronic medical condition were stable during the hospitalization.  Patient was seen by physical therapy, who recommended SNF, which was arranged by Education officer, museum and case Freight forwarder. On the day of the discharge the patient's vitals were stable, and no other acute medical condition were reported by patient. the patient was felt safe to be discharge at SNF with therapy.  Procedures and Results:  none   Consultations:  none  DISCHARGE MEDICATION: Current Discharge Medication List    START taking these medications   Details  furosemide (LASIX) 20 MG tablet Take 1 tablet (20 mg total) by mouth daily. Qty: 5 tablet, Refills: 0    predniSONE (DELTASONE) 10 MG tablet Take 40mg  daily for 3days,Take 30mg  daily for 3days,Take 20mg  daily for 3days,Take 10mg  daily for 3days, then stop Qty: 30 tablet, Refills: 0    traMADol (ULTRAM) 50 MG tablet Take 1 tablet (50 mg total) by mouth every 6 (six) hours as needed for severe pain. Qty: 20 tablet, Refills: 0      CONTINUE these medications which have NOT CHANGED   Details  aspirin 81 MG chewable tablet Chew 81 mg by mouth daily.    clopidogrel (PLAVIX) 75 MG tablet Take 1  tablet by mouth  daily Qty: 90 tablet, Refills: 3    fish oil-omega-3 fatty acids 1000 MG capsule Take 2 g by mouth daily.     isosorbide mononitrate (IMDUR) 30 MG 24 hr tablet Take 1 tablet by mouth  twice a day Qty: 180 tablet, Refills: 1    metoprolol tartrate (LOPRESSOR) 25 MG tablet Take 1 tablet by mouth two  times daily Qty: 180 tablet, Refills: 3    simvastatin (ZOCOR) 80 MG tablet Take 1 tablet by mouth at  bedtime Qty: 90 tablet, Refills: 3    clobetasol cream (TEMOVATE) [id number] % Apply 1 application topically 2 (two) times daily. Qty: 45 g, Refills: 1    nitroGLYCERIN (NITROSTAT) 0.4 MG SL tablet Place 0.4 mg under the tongue every 5 (five) minutes as needed. For chest pain      STOP taking these medications     colchicine 0.6 MG tablet      metFORMIN (GLUCOPHAGE-XR) 500 MG 24 hr tablet      quinapril (ACCUPRIL) 20 MG tablet      allopurinol (ZYLOPRIM) 300 MG tablet        No Known Allergies Discharge Instructions    Diet - low sodium heart healthy  Complete by:  As directed      Diet Carb Modified    Complete by:  As directed      Discharge instructions    Complete by:  As directed   It is important that you read following instructions as well as go over your medication list with RN to help you understand your care after this hospitalization.  Discharge Instructions: Please follow-up with PCP in one week  Please request your primary care physician to go over all Hospital Tests and Procedure/Radiological results at the follow up,  Please get all Hospital records sent to your PCP by signing hospital release before you go home.   Do not drive, operating heavy machinery, perform activities at heights, swimming or participation in water activities or provide baby sitting services while your are on Pain, Sleep and Anxiety Medications; until you have been seen by Primary Care Physician or a Neurologist and advised to do so again. Do not take more than prescribed  Pain, Sleep and Anxiety Medications. You were cared for by a hospitalist during your hospital stay. If you have any questions about your discharge medications or the care you received while you were in the hospital after you are discharged, you can call the unit and ask to speak with the hospitalist on call if the hospitalist that took care of you is not available.  Once you are discharged, your primary care physician will handle any further medical issues. Please note that NO REFILLS for any discharge medications will be authorized once you are discharged, as it is imperative that you return to your primary care physician (or establish a relationship with a primary care physician if you do not have one) for your aftercare needs so that they can reassess your need for medications and monitor your lab values. You Must read complete instructions/literature along with all the possible adverse reactions/side effects for all the Medicines you take and that have been prescribed to you. Take any new Medicines after you have completely understood and accept all the possible adverse reactions/side effects. Wear Seat belts while driving. If you have smoked or chewed Tobacco in the last 2 yrs please stop smoking and/or stop any Recreational drug use.     Increase activity slowly    Complete by:  As directed           Discharge Exam: Filed Weights   04/19/15 0700 04/20/15 0617 04/22/15 0551  Weight: 97.4 kg (214 lb 11.7 oz) 93.1 kg (205 lb 4 oz) 96.752 kg (213 lb 4.8 oz)   Filed Vitals:   04/21/15 2115 04/22/15 0551  BP: 133/57 163/48  Pulse: 67 67  Temp: 98.3 F (36.8 C) 97.7 F (36.5 C)  Resp: 18 18   General: Appear in no distress, no Rash; Oral Mucosa moist. Cardiovascular: S1 and S2 Present, no Murmur, no JVD Respiratory: Bilateral Air entry present and Clear to Auscultation, no Crackles, no wheezes Abdomen: Bowel Sound present, Soft and no tenderness Extremities: trace Pedal edema, no calf  tenderness Neurology: Grossly no focal neuro deficit.  The results of significant diagnostics from this hospitalization (including imaging, microbiology, ancillary and laboratory) are listed below for reference.    Significant Diagnostic Studies: US Aorta  04/18/2015  CLINICAL DATA:  Weakness.  History of abdominal aortic aneurysm. EXAM: ULTRASOUND OF ABDOMINAL AORTA TECHNIQUE: Ultrasound examination of the abdominal aorta was performed to evaluate for abdominal aortic aneurysm. COMPARISON:  11/07/2014 FINDINGS: When remeasured in the same manner on prior there is  unchanged fusiform aneurysmal enlargement of the distal aorta with a maximal diameter of 37 mm. The aorta is diffusely atheromatous with wall thickening and calcification. No visible dissection flap or retroperitoneal fluid. IMPRESSION: Fusiform infrarenal aortic aneurysm with unchanged dimensions compared to 2016. Maximal diameter is 37 mm. Electronically Signed   By: Monte Fantasia M.D.   On: 04/18/2015 05:20   US Abdomen Limited Ruq  04/18/2015  CLINICAL DATA:  Abdominal pain for 2 days. EXAM: US ABDOMEN LIMITED - RIGHT UPPER QUADRANT COMPARISON:  None. FINDINGS: Gallbladder: No gallstones or wall thickening visualized. No sonographic Murphy sign noted by sonographer. Common bile duct: Diameter: 5 mm Liver: No focal lesion identified. Within normal limits in parenchymal echogenicity. Antegrade flow in the imaged hepatic and portal venous system. IMPRESSION: Normal right upper quadrant ultrasound. Electronically Signed   By: Monte Fantasia M.D.   On: 04/18/2015 05:06    Microbiology: Recent Results (from the past 240 hour(s))  Urine culture     Status: None   Collection Time: 04/18/15  2:04 AM  Result Value Ref Range Status   Specimen Description URINE, CLEAN CATCH  Final   Special Requests NONE  Final   Culture   Final    NO GROWTH 1 DAY Performed at [facility]    Report Status 04/19/2015 FINAL  Final  C difficile  quick scan w PCR reflex     Status: None   Collection Time: 04/18/15  5:38 AM  Result Value Ref Range Status   C Diff antigen NEGATIVE NEGATIVE Final   C Diff toxin NEGATIVE NEGATIVE Final   C Diff interpretation Negative for toxigenic C. difficile  Final  Gastrointestinal Panel by PCR , Stool     Status: None   Collection Time: 04/18/15  5:38 AM  Result Value Ref Range Status   Campylobacter species NOT DETECTED NOT DETECTED Final   Plesimonas shigelloides NOT DETECTED NOT DETECTED Final   Salmonella species NOT DETECTED NOT DETECTED Final   Yersinia enterocolitica NOT DETECTED NOT DETECTED Final   Vibrio species NOT DETECTED NOT DETECTED Final   Vibrio cholerae NOT DETECTED NOT DETECTED Final   Enteroaggregative E coli (EAEC) NOT DETECTED NOT DETECTED Final   Enteropathogenic E coli (EPEC) NOT DETECTED NOT DETECTED Final   Enterotoxigenic E coli (ETEC) NOT DETECTED NOT DETECTED Final   Shiga like toxin producing E coli (STEC) NOT DETECTED NOT DETECTED Final   E. coli O157 NOT DETECTED NOT DETECTED Final   Shigella/Enteroinvasive E coli (EIEC) NOT DETECTED NOT DETECTED Final   Cryptosporidium NOT DETECTED NOT DETECTED Final   Cyclospora cayetanensis NOT DETECTED NOT DETECTED Final   Entamoeba histolytica NOT DETECTED NOT DETECTED Final   Giardia lamblia NOT DETECTED NOT DETECTED Final   Adenovirus F40/41 NOT DETECTED NOT DETECTED Final   Astrovirus NOT DETECTED NOT DETECTED Final   Norovirus GI/GII NOT DETECTED NOT DETECTED Final   Rotavirus A NOT DETECTED NOT DETECTED Final   Sapovirus (I, II, IV, and V) NOT DETECTED NOT DETECTED Final     Labs: CBC:  Recent Labs Lab 04/18/15 0154 04/19/15 0520 04/20/15 0540 04/21/15 0430 04/22/15 0452  WBC 6.3 4.9 5.9 5.2 6.1  NEUTROABS  --   --  4.3 4.1 4.0  HGB 11.0* 10.0* 9.3* 8.9* 8.9*  HCT 31.6* 28.4* 25.6* 25.2* 25.2*  MCV 89.5 89.0 84.8 87.8 87.5  PLT 88* 70* 57* 51* 75*   Basic Metabolic Panel:  Recent Labs Lab  04/18/15 0154 04/19/15 0520 04/20/15 0540  04/21/15 0430 04/22/15 0452  NA 141 138 139 138 136  K 4.6 3.9 3.9 4.0 4.0  CL 113* 112* 113* 110 106  CO2 17* 17* 18* 19* 21*  GLUCOSE 163* 116* 108* 144* 169*  BUN 70* 52* 34* 26* 32*  CREATININE 1.82* 1.28* 0.97 0.83 0.95  CALCIUM 9.0 8.6* 8.5* 8.7* 8.7*  MG  --   --  1.4*  --   --    Liver Function Tests:  Recent Labs Lab 04/18/15 0154 04/19/15 0520 04/20/15 0540 04/21/15 0430 04/22/15 0452  AST 211* 167* 146* 93* 83*  ALT 156* 149* 150* 124* 116*  ALKPHOS 147* 123 110 99 88  BILITOT 0.8 0.9 1.2 0.8 0.8  PROT 6.8 6.1* 5.9* 5.8* 5.8*  ALBUMIN 2.7* 2.5* 2.5* 2.6* 2.6*    Recent Labs Lab 04/18/15 0154  LIPASE 20   No results for input(s): AMMONIA in the last 168 hours. Cardiac Enzymes: No results for input(s): CKTOTAL, CKMB, CKMBINDEX, TROPONINI in the last 168 hours. BNP (last 3 results) No results for input(s): BNP in the last 8760 hours. CBG:  Recent Labs Lab 04/21/15 1201 04/21/15 1647 04/21/15 2116 04/22/15 0751 04/22/15 1153  GLUCAP 179* 195* 236* 197* 140*   Time spent: 30 minutes  Signed:  Saraia Platner  [facility]  04/22/2015  , 12:50 PM

## 2015-04-22 NOTE — Progress Notes (Signed)
Inpatient Diabetes Program Recommendations  AACE/ADA: New Consensus Statement on Inpatient Glycemic Control (2015)  Target Ranges:  Prepandial:   less than 140 mg/dL      Peak postprandial:   less than 180 mg/dL (1-2 hours)      Critically ill patients:  140 - 180 mg/dL   Results for Paul [name], Paul [name] (MRN [id number]) as of 04/22/2015 09:37  Ref. Range 04/21/2015 07:50 04/21/2015 12:01 04/21/2015 16:47 04/21/2015 21:16  Glucose-Capillary Latest Ref Range: 65-99 mg/dL 157 (H) 179 (H) 195 (H) 236 (H)    Admit with: Diarrhea/ Weakness/ Dehydration  History: DM2  Home DM Meds: Metformin 1000 mg daily  Current Insulin Orders: Novolog Sensitive Correction Scale/ SSI (0-9 units) TID AC     MD- Note patient currently getting Prednisone 50 mg daily. Having some elevated CBG readings.  Please consider increasing Novolog Correction Scale/ SSI to Moderate scale (0-15 units) TID AC + HS while patient getting Prednisone    --Will follow patient during hospitalization--  Wyn Quaker RN, MSN, CDE Diabetes Coordinator Inpatient Glycemic Control Team Team Pager: [phone] (8a-5p)

## 2015-04-22 NOTE — Clinical Social Work Placement (Signed)
   CLINICAL SOCIAL WORK PLACEMENT  NOTE  Date:  04/22/2015  Patient Details  Name: Paul [name] MRN: [id number] Date of Birth: 02/27/1940  Clinical Social Work is seeking post-discharge placement for this patient at the [place] level of care (*CSW will initial, date and re-position this form in  chart as items are completed):  Yes   Patient/family provided with [facility] Work Department's list of facilities offering this level of care within the geographic area requested by the patient (or if unable, by the patient's family).  Yes   Patient/family informed of their freedom to choose among providers that offer the needed level of care, that participate in Medicare, Medicaid or managed care program needed by the patient, have an available bed and are willing to accept the patient.  Yes   Patient/family informed of [facility]'s ownership interest in [facility] and [facility], as well as of the fact that they are under no obligation to receive care at these facilities.  PASRR submitted to EDS on 04/21/15     PASRR number received on 04/21/15     Existing PASRR number confirmed on       FL2 transmitted to all facilities in geographic area requested by pt/family on 04/21/15     FL2 transmitted to all facilities within larger geographic area on 04/21/15     Patient informed that his/her managed care company has contracts with or will negotiate with certain facilities, including the following:        Yes   Patient/family informed of bed offers received.  Patient chooses bed at [facility]     Physician recommends and patient chooses bed at      Patient to be transferred to [facility] on 04/22/15.  Patient to be transferred to facility by ambulance Corey Harold)     Patient family notified on 04/22/15 of transfer.  Name of family member notified:  pt notified at bedside     PHYSICIAN       Additional Comment:     _______________________________________________ Ladell Pier, LCSW 04/22/2015, 5:43 PM

## 2015-04-22 NOTE — Care Management Obs Status (Signed)
[facility] [place] NOTIFICATION   Patient Details  Name: LINKYN HENDLER MRN: [id number] Date of Birth: [birth year]   Medicare Observation Status Notification Given:  Yes    Guadalupe Maple, RN 04/22/2015, 11:59 AM

## 2015-04-22 NOTE — Progress Notes (Signed)
Report called and given to [place] at [facility], patient is awaiting transportation Neta Mends RN 4:47 PM 04-22-2015

## 2015-04-22 NOTE — Progress Notes (Signed)
CSW continuing to follow.  Per MD, pt medically ready for dc today.  BSW Intern met with pt at bedside this morning to inquire about SNF decision. PT chooses bed at [facility].  BSW Intern contacted [facility], Ivin Booty to confirm bed available today.  BSW Intern provided disposition needs including faxing dc summary to [facility], providing RN with number to call Report, and setting-up transport via [facility] to facility.   No further SW needs identified at this time.  BSW Intern signing off, Harlon Flor, [place] Work Department  [phone]

## 2015-04-23 ENCOUNTER — Telehealth: Payer: Self-pay | Admitting: Family Medicine

## 2015-04-23 ENCOUNTER — Non-Acute Institutional Stay (SKILLED_NURSING_FACILITY): Payer: Medicare Other | Admitting: Adult Health

## 2015-04-23 ENCOUNTER — Encounter: Payer: Self-pay | Admitting: Adult Health

## 2015-04-23 DIAGNOSIS — R7989 Other specified abnormal findings of blood chemistry: Secondary | ICD-10-CM

## 2015-04-23 DIAGNOSIS — I2510 Atherosclerotic heart disease of native coronary artery without angina pectoris: Secondary | ICD-10-CM

## 2015-04-23 DIAGNOSIS — D696 Thrombocytopenia, unspecified: Secondary | ICD-10-CM

## 2015-04-23 DIAGNOSIS — E538 Deficiency of other specified B group vitamins: Secondary | ICD-10-CM

## 2015-04-23 DIAGNOSIS — R945 Abnormal results of liver function studies: Secondary | ICD-10-CM

## 2015-04-23 DIAGNOSIS — D638 Anemia in other chronic diseases classified elsewhere: Secondary | ICD-10-CM

## 2015-04-23 DIAGNOSIS — M109 Gout, unspecified: Secondary | ICD-10-CM

## 2015-04-23 DIAGNOSIS — M10041 Idiopathic gout, right hand: Secondary | ICD-10-CM

## 2015-04-23 DIAGNOSIS — R531 Weakness: Secondary | ICD-10-CM | POA: Diagnosis not present

## 2015-04-23 DIAGNOSIS — E119 Type 2 diabetes mellitus without complications: Secondary | ICD-10-CM | POA: Diagnosis not present

## 2015-04-23 DIAGNOSIS — E43 Unspecified severe protein-calorie malnutrition: Secondary | ICD-10-CM | POA: Diagnosis not present

## 2015-04-23 DIAGNOSIS — E785 Hyperlipidemia, unspecified: Secondary | ICD-10-CM

## 2015-04-23 DIAGNOSIS — K5901 Slow transit constipation: Secondary | ICD-10-CM | POA: Diagnosis not present

## 2015-04-23 NOTE — Progress Notes (Signed)
Patient ID: Paul [name], male   DOB: 1940-08-23, 75 y.o.   MRN: [id number]    DATE:  04/23/15   MRN:  [id number]  BIRTHDAY: [birth year]  Facility:  [facility]ursing Home Location:  [place] Room Number: [id number]  LEVEL OF CARE:  SNF [phone])  Contact Information    Name Relation Home Work Mobile   [place] Sister [phone]     [place], Yeck [phone]  [phone]       Code Status History    Date Active Date Inactive Code Status Order ID Comments User Context   04/18/2015  8:19 AM 04/22/2015 11:38 PM Full Code [id number]  Robbie Lis, MD Inpatient       Chief Complaint  Patient presents with  . Hospitalization Follow-up    HISTORY OF PRESENT ILLNESS:  This is a 75 year old male who has been admitted to [facility] on 04/22/15 from [facility]. He has PMH of CAD with stent RCA, diabetes mellitus, bilateral carotid artery disease, hypertension and hyperlipidemia. Patient reported having diarrhea X 1  1/2 week, poor PO intake resulting into generalized weakness with near fall episode. ED work-up showed hgb 11, platelet 88, CO2 17, creatinine 1.82, glucose 163, AST 211 and ALP 147. Vitamin B12 level was 153 and was given Vitamin B 12 SQ X 1. He has been taking Colchicine and allopurinol intermittently which possibly causing patient's acute gout and thrombocytopenia. He was started on oral Prednisone.   He was seen in his room with wife @ bedside. He now complains of constipation.  He has been admitted for a short-term rehabilitation.  PAST MEDICAL HISTORY:  Past Medical History  Diagnosis Date  . Diabetes mellitus     with neuropathy  . Hypertension   . AAA (abdominal aortic aneurysm) ([place])   . Gout   . Vitamin B12 deficiency   . Hiatal hernia   . CAD (coronary artery disease)     with cath 09/26/08 showing severe heart disease in RCA with POBA, mild to mod diesease in circ and LAD  . Chronic back pain     back  problmes. prior to surgery   . Hyperlipidemia   . Weakness   . Coronary atherosclerosis of native coronary artery   . AKI (acute kidney injury) ([place])   . Dehydration   . Diarrhea   . Thrombocytopenia ([place])   . Anemia of chronic disease   . Abnormal LFTs   . Metabolic acidosis      CURRENT MEDICATIONS: Reviewed  Patient's Medications  New Prescriptions   No medications on file  Previous Medications   ASPIRIN 81 MG CHEWABLE TABLET    Chew 81 mg by mouth daily.   CLOBETASOL CREAM (TEMOVATE) 0.05 %    Apply 1 application topically 2 (two) times daily.   CLOPIDOGREL (PLAVIX) 75 MG TABLET    Take 1 tablet by mouth  daily   FISH OIL-OMEGA-3 FATTY ACIDS 1000 MG CAPSULE    Take 2 g by mouth daily.    FUROSEMIDE (LASIX) 20 MG TABLET    Take 1 tablet (20 mg total) by mouth daily.   ISOSORBIDE MONONITRATE (IMDUR) 30 MG 24 HR TABLET    Take 1 tablet by mouth  twice a day   METOPROLOL TARTRATE (LOPRESSOR) 25 MG TABLET    Take 1 tablet by mouth two  times daily   NITROGLYCERIN (NITROSTAT) 0.4 MG SL TABLET    Place 0.4 mg under the tongue every 5 (  five) minutes as needed for chest pain. For 3 doses   PREDNISONE (DELTASONE) 10 MG TABLET    Take 40mg  daily for 3days,Take 30mg  daily for 3days,Take 20mg  daily for 3days,Take 10mg  daily for 3days, then stop   SIMVASTATIN (ZOCOR) 80 MG TABLET    Take 1 tablet by mouth at  bedtime   TRAMADOL (ULTRAM) 50 MG TABLET    Take 1 tablet (50 mg total) by mouth every 6 (six) hours as needed for severe pain.  Modified Medications   No medications on file  Discontinued Medications   No medications on file     No Known Allergies   REVIEW OF SYSTEMS:  GENERAL: no change in appetite, no fatigue, no weight changes, no fever, chills or weakness EYES: Denies change in vision, dry eyes, eye pain, itching or discharge EARS: Denies change in hearing, ringing in ears, or earache NOSE: Denies nasal congestion or epistaxis MOUTH and THROAT: Denies oral discomfort,  gingival pain or bleeding, pain from teeth or hoarseness   RESPIRATORY: no cough, SOB, DOE, wheezing, hemoptysis CARDIAC: no chest pain, edema or palpitations GI: no abdominal pain, diarrhea, heart burn, nausea or vomiting, +constipation GU: Denies dysuria, frequency, hematuria, incontinence, or discharge PSYCHIATRIC: Denies feeling of depression or anxiety. No report of hallucinations, insomnia, paranoia, or agitation   PHYSICAL EXAMINATION  GENERAL APPEARANCE: Well nourished. In no acute distress. Normal body habitus SKIN:  Skin is warm and dry.  HEAD: Normal in size and contour. No evidence of trauma EYES: Lids open and close normally. No blepharitis, entropion or ectropion. PERRL. Conjunctivae are clear and sclerae are white. Lenses are without opacity EARS: Pinnae are normal. Patient hears normal voice tunes of the examiner MOUTH and THROAT: Lips are without lesions. Oral mucosa is moist and without lesions. Tongue is normal in shape, size, and color and without lesions NECK: supple, trachea midline, no neck masses, no thyroid tenderness, no thyromegaly LYMPHATICS: no LAN in the neck, no supraclavicular LAN RESPIRATORY: breathing is even & unlabored, BS CTAB CARDIAC: RRR, no murmur,no extra heart sounds, no edema GI: abdomen soft, normal BS, no masses, no tenderness, no hepatomegaly, no splenomegaly EXTREMITIES:  Able to move X 4 extremities PSYCHIATRIC: Alert and oriented X 3. Affect and behavior are appropriate  LABS/RADIOLOGY: Labs reviewed: Basic Metabolic Panel:  Recent Labs  04/20/15 0540 04/21/15 0430 04/22/15 0452  NA 139 138 136  K 3.9 4.0 4.0  CL 113* 110 106  CO2 18* 19* 21*  GLUCOSE 108* 144* 169*  BUN 34* 26* 32*  CREATININE 0.97 0.83 0.95  CALCIUM 8.5* 8.7* 8.7*  MG 1.4*  --   --    Liver Function Tests:  Recent Labs  04/20/15 0540 04/21/15 0430 04/22/15 0452  AST 146* 93* 83*  ALT 150* 124* 116*  ALKPHOS 110 99 88  BILITOT 1.2 0.8 0.8  PROT  5.9* 5.8* 5.8*  ALBUMIN 2.5* 2.6* 2.6*    Recent Labs  04/18/15 0154  LIPASE 20   CBC:  Recent Labs  04/20/15 0540 04/21/15 0430 04/22/15 0452  WBC 5.9 5.2 6.1  NEUTROABS 4.3 4.1 4.0  HGB 9.3* 8.9* 8.9*  HCT 25.6* 25.2* 25.2*  MCV 84.8 87.8 87.5  PLT 57* 51* 75*   Lipid Panel:  Recent Labs  03/03/15 0001  HDL 30*   CBG:  Recent Labs  04/21/15 2116 04/22/15 0751 04/22/15 1153  GLUCAP 236* 197* 140*      US Aorta  04/18/2015  CLINICAL DATA:  Weakness.  History of abdominal aortic aneurysm. EXAM: ULTRASOUND OF ABDOMINAL AORTA TECHNIQUE: Ultrasound examination of the abdominal aorta was performed to evaluate for abdominal aortic aneurysm. COMPARISON:  11/07/2014 FINDINGS: When remeasured in the same manner on prior there is unchanged fusiform aneurysmal enlargement of the distal aorta with a maximal diameter of 37 mm. The aorta is diffusely atheromatous with wall thickening and calcification. No visible dissection flap or retroperitoneal fluid. IMPRESSION: Fusiform infrarenal aortic aneurysm with unchanged dimensions compared to 2016. Maximal diameter is 37 mm. Electronically Signed   By: Monte Fantasia M.D.   On: 04/18/2015 05:20   US Abdomen Limited Ruq  04/18/2015  CLINICAL DATA:  Abdominal pain for 2 days. EXAM: US ABDOMEN LIMITED - RIGHT UPPER QUADRANT COMPARISON:  None. FINDINGS: Gallbladder: No gallstones or wall thickening visualized. No sonographic Murphy sign noted by sonographer. Common bile duct: Diameter: 5 mm Liver: No focal lesion identified. Within normal limits in parenchymal echogenicity. Antegrade flow in the imaged hepatic and portal venous system. IMPRESSION: Normal right upper quadrant ultrasound. Electronically Signed   By: Monte Fantasia M.D.   On: 04/18/2015 05:06    ASSESSMENT/PLAN:  Generalized weakness - for rehabilitation  Vitamin B12 deficiency - B12 level 153; given vitamin B12 subcutaneous 1 dose; start vitamin B12 1000 g daily and  check vitamin B12 level in 1 month  Elevated LFTs - likely due to dehydration; ALT 116, AST 83; CMP next lab draw  Recurrent gout - allopurinol and colchicine were discontinued; continue prednisone and Ultram 50 mg 1 tab by mouth every 6 hours when necessary  Type 2 diabetes mellitus, controlled -  CBG daily 2 weeks; check hemoglobin A1c  Chronic thrombocytopenia - platelet 75; no active bleeding; allopurinol and cold she seen were discontinued and can cause thrombocytopenia; referred to hematology  Coronary artery disease - no active chest pain, has significant RCA obstruction not amenable to PCI; continue Imdur 30 mg 24 hour 1 tab by mouth daily, NTG when necessary, aspirin 81 mg daily and Plavix 75 mg by mouth daily   Protein calorie malnutrition, severe - albumin 2.6; start Procel 2 scoops by mouth twice a day  Constipation - start senna S2 tabs by mouth twice a day and MiraLAX 17 g by mouth twice a day 3 days then when necessary  Hyperlipidemia - continue Zocor 80 mg 1 tab by mouth daily at bedtime  Anemia of chronic disease - hemoglobin 8.9; check CBC    Goals of care:  Short-term rehabilitation    [facility], [facility] [phone]

## 2015-04-25 ENCOUNTER — Non-Acute Institutional Stay (SKILLED_NURSING_FACILITY): Payer: Medicare Other | Admitting: Internal Medicine

## 2015-04-25 ENCOUNTER — Encounter: Payer: Self-pay | Admitting: Internal Medicine

## 2015-04-25 DIAGNOSIS — E538 Deficiency of other specified B group vitamins: Secondary | ICD-10-CM

## 2015-04-25 DIAGNOSIS — M10041 Idiopathic gout, right hand: Secondary | ICD-10-CM | POA: Diagnosis not present

## 2015-04-25 DIAGNOSIS — R531 Weakness: Secondary | ICD-10-CM

## 2015-04-25 DIAGNOSIS — R748 Abnormal levels of other serum enzymes: Secondary | ICD-10-CM

## 2015-04-25 DIAGNOSIS — D638 Anemia in other chronic diseases classified elsewhere: Secondary | ICD-10-CM

## 2015-04-25 DIAGNOSIS — D696 Thrombocytopenia, unspecified: Secondary | ICD-10-CM

## 2015-04-25 DIAGNOSIS — E785 Hyperlipidemia, unspecified: Secondary | ICD-10-CM | POA: Diagnosis not present

## 2015-04-25 DIAGNOSIS — I2510 Atherosclerotic heart disease of native coronary artery without angina pectoris: Secondary | ICD-10-CM | POA: Diagnosis not present

## 2015-04-25 DIAGNOSIS — M109 Gout, unspecified: Secondary | ICD-10-CM

## 2015-04-25 DIAGNOSIS — I10 Essential (primary) hypertension: Secondary | ICD-10-CM | POA: Diagnosis not present

## 2015-04-25 DIAGNOSIS — K5900 Constipation, unspecified: Secondary | ICD-10-CM

## 2015-04-25 LAB — HEPATIC FUNCTION PANEL
ALT: 100 U/L — AB (ref 10–40)
AST: 43 U/L — AB (ref 14–40)
Alkaline Phosphatase: 89 U/L (ref 25–125)
Bilirubin, Total: 0.8 mg/dL

## 2015-04-25 LAB — HEMOGLOBIN A1C: Hemoglobin A1C: 7.7

## 2015-04-25 LAB — BASIC METABOLIC PANEL
BUN: 31 mg/dL — AB (ref 4–21)
Creatinine: 0.9 mg/dL (ref 0.6–1.3)
Glucose: 123 mg/dL
Potassium: 3.8 mmol/L (ref 3.4–5.3)
Sodium: 135 mmol/L — AB (ref 137–147)

## 2015-04-25 LAB — CBC AND DIFFERENTIAL
HCT: 26 % — AB (ref 41–53)
Hemoglobin: 9 g/dL — AB (ref 13.5–17.5)
Neutrophils Absolute: 3 /uL
Platelets: 133 10*3/uL — AB (ref 150–399)
WBC: 6.2 10^3/mL

## 2015-04-25 NOTE — Progress Notes (Signed)
LOCATION: [place]  PCP: Wyatt Haste, MD   Code Status: Full Code  Goals of care: Advanced Directive information Advanced Directives 04/18/2015  Does patient have an advance directive? No  Would patient like information on creating an advanced directive? No - patient declined information  Pre-existing out of facility DNR order (yellow form or pink MOST form) -       Extended Emergency Contact Information Primary Emergency Contact: Saulter,Frances Address: [address]          [place], [place] [address] [place] Phone: [phone] Relation: Sister Secondary Emergency Contact: Bjorn Loser Address: [address]          [place], [place] [address] [place] of [place] Phone: [phone] Mobile Phone: [phone] Relation: Spouse   No Known Allergies  Chief Complaint  Patient presents with  . New Admit To SNF    New Admission     HPI:  Patient is a 75 y.o. male seen today for short term rehabilitation post hospital admission from 04/18/15-04/22/15 with generalized weakness thought to be from b12 deficiency. He was given 1 dose of b12 subcutaneously. He also had diarrhea. Infectious etiology including c.diff were ruled out. There were concerns for colchicine contributing to it. He was given imodium. He also had AKI and received hydration. He had thrombocytopenia and colchicine was thought to be contributing to this. He received prednisone. He has PMH of CAD, diabetes mellitus, bilateral carotid artery disease, hypertension and hyperlipidemia. He is seen in his room today. He complaints of feeling weak. He has not had a bowel movement for 6 days and this concerns him   Review of Systems:  Constitutional: Negative for fever, chills, diaphoresis.  HENT: Negative for headache, congestion, nasal discharge, hearing loss, sore throat, difficulty swallowing. Eyes: Negative for blurred vision, double vision and discharge.  Respiratory:  Negative for wheezing. Positive for dry cough and shortness of breath on exertion  Cardiovascular: Negative for chest pain, palpitations, leg swelling.  Gastrointestinal: Negative for heartburn, nausea, vomiting,loss of appetite. Passing flatus. Has some abdominal discomfort.   Genitourinary: Negative for dysuria and flank pain.  Musculoskeletal: Negative for back pain, fall in the facility.  Skin: Negative for itching, rash.  Neurological: Negative for dizziness. Positive for weakness. Psychiatric/Behavioral: Negative for depression.    Past Medical History  Diagnosis Date  . Diabetes mellitus     with neuropathy  . Hypertension   . AAA (abdominal aortic aneurysm) ([place])   . Gout   . Vitamin B12 deficiency   . Hiatal hernia   . CAD (coronary artery disease)     with cath 09/26/08 showing severe heart disease in RCA with POBA, mild to mod diesease in circ and LAD  . Chronic back pain     back problmes. prior to surgery   . Hyperlipidemia   . Weakness   . Coronary atherosclerosis of native coronary artery   . AKI (acute kidney injury) ([place])   . Dehydration   . Diarrhea   . Thrombocytopenia ([place])   . Anemia of chronic disease   . Abnormal LFTs   . Metabolic acidosis    Past Surgical History  Procedure Laterality Date  . Back surgery      x3  . Left ankle surgery    . Carpal tunnel release      L arm   . 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 Left heart catheterization with coronary angiogram N/A  01/21/2012    Procedure: LEFT HEART CATHETERIZATION WITH CORONARY ANGIOGRAM;  Surgeon: Burnell Blanks, MD;  Location[facility] CATH LAB;  Service: Cardiovascular;  Laterality: N/A;   Social History:   reports that he has quit smoking. His smoking use included Cigars. He has never used smokeless tobacco. He reports that he drinks about 0.6 oz of alcohol per week. He reports that he does not use illicit drugs.  Family History  Problem Relation Age of Onset  . Heart attack Mother     . Cancer Mother   . Heart attack Sister     Medications:   Medication List       This list is accurate as of: 04/25/15  3:04 PM.  Always use your most recent med list.               aspirin 81 MG chewable tablet  Chew 81 mg by mouth daily.     bisacodyl 10 MG suppository  Commonly known as:  DULCOLAX  Place 10 mg rectally daily as needed for moderate constipation.     clobetasol cream 0.05 %  Commonly known as:  TEMOVATE  Apply 1 application topically 2 (two) times daily.     clopidogrel 75 MG tablet  Commonly known as:  PLAVIX  Take 1 tablet by mouth  daily     fish oil-omega-3 fatty acids 1000 MG capsule  Take by mouth daily. Take 2 capsules     isosorbide mononitrate 30 MG 24 hr tablet  Commonly known as:  IMDUR  Take 30 mg by mouth 2 (two) times daily. Take at 9 am and 6 pm     metoprolol tartrate 25 MG tablet  Commonly known as:  LOPRESSOR  Take 1 tablet by mouth two  times daily     nitroGLYCERIN 0.4 MG SL tablet  Commonly known as:  NITROSTAT  Place 0.4 mg under the tongue every 5 (five) minutes as needed for chest pain. For 3 doses     polyethylene glycol packet  Commonly known as:  MIRALAX / GLYCOLAX  Take 17 g by mouth 2 (two) times daily. For 3 days. Then prn     predniSONE 10 MG tablet  Commonly known as:  DELTASONE  Take 40mg  daily for 3days,Take 30mg  daily for 3days,Take 20mg  daily for 3days,Take 10mg  daily for 3days, then stop     PROCEL Powd  Take 2 scoop by mouth 2 (two) times daily.     senna 8.6 MG tablet  Commonly known as:  SENOKOT  Take 2 tablets by mouth 2 (two) times daily.     simvastatin 80 MG tablet  Commonly known as:  ZOCOR  Take 1 tablet by mouth at  bedtime     traMADol 50 MG tablet  Commonly known as:  ULTRAM  Take by mouth every 4 (four) hours as needed.     vitamin B-12 1000 MCG tablet  Commonly known as:  CYANOCOBALAMIN  Take 1,000 mcg by mouth daily.        Immunizations: Immunization History   Administered Date(s) Administered  . Influenza, High Dose Seasonal PF 10/31/2014  . Influenza-Unspecified 11/30/2013  . PPD Test 04/22/2015  . Pneumococcal Conjugate-13 09/27/2013  . Pneumococcal Polysaccharide-23 09/06/2011  . Tdap 08/24/2010  . Zoster 02/02/2012     Physical Exam: Filed Vitals:   04/25/15 1445  BP: 126/59  Pulse: 65  Temp: 97 F (36.1 C)  TempSrc: Oral  Resp: 16  SpO2: 97%    General-  elderly male, in no acute distress Head- normocephalic, atraumatic Nose- no maxillary or frontal sinus tenderness, no nasal discharge Throat- dry mucus membrane Eyes- PERRLA, EOMI, no pallor, no icterus, no discharge, normal conjunctiva, normal sclera Neck- no cervical lymphadenopathy Cardiovascular- normal s1,s2, no murmur, no leg edema Respiratory- bilateral clear to auscultation, no wheeze, no rhonchi, no crackles, no use of accessory muscles Abdomen- bowel sounds present, soft, non tender, no guarding or rigidity Musculoskeletal- able to move all 4 extremities, generalized weakness Neurological- alert and oriented to person, place and time Skin- warm and dry Psychiatry- normal mood and affect    Labs reviewed: Basic Metabolic Panel:  Recent Labs  04/20/15 0540 04/21/15 0430 04/22/15 04/22/15 0452  NA 139 138 136* 136  K 3.9 4.0  --  4.0  CL 113* 110  --  106  CO2 18* 19*  --  21*  GLUCOSE 108* 144*  --  169*  BUN 34* 26* 32* 32*  CREATININE 0.97 0.83 1.0 0.95  CALCIUM 8.5* 8.7*  --  8.7*  MG 1.4*  --   --   --    Liver Function Tests:  Recent Labs  04/20/15 0540 04/21/15 0430 04/22/15 0452  AST 146* 93* 83*  ALT 150* 124* 116*  ALKPHOS 110 99 88  BILITOT 1.2 0.8 0.8  PROT 5.9* 5.8* 5.8*  ALBUMIN 2.5* 2.6* 2.6*    Recent Labs  04/18/15 0154  LIPASE 20   No results for input(s): AMMONIA in the last 8760 hours. CBC:  Recent Labs  04/20/15 0540 04/21/15 0430 04/22/15 04/22/15 0452  WBC 5.9 5.2 6.1 6.1  NEUTROABS 4.3 4.1  --  4.0   HGB 9.3* 8.9*  --  8.9*  HCT 25.6* 25.2*  --  25.2*  MCV 84.8 87.8  --  87.5  PLT 57* 51*  --  75*   CBG:  Recent Labs  04/21/15 2116 04/22/15 0751 04/22/15 1153  GLUCAP 236* 197* 140*    Radiological Exams: US Aorta  04/18/2015  CLINICAL DATA:  Weakness.  History of abdominal aortic aneurysm. EXAM: ULTRASOUND OF ABDOMINAL AORTA TECHNIQUE: Ultrasound examination of the abdominal aorta was performed to evaluate for abdominal aortic aneurysm. COMPARISON:  11/07/2014 FINDINGS: When remeasured in the same manner on prior there is unchanged fusiform aneurysmal enlargement of the distal aorta with a maximal diameter of 37 mm. The aorta is diffusely atheromatous with wall thickening and calcification. No visible dissection flap or retroperitoneal fluid. IMPRESSION: Fusiform infrarenal aortic aneurysm with unchanged dimensions compared to 2016. Maximal diameter is 37 mm. Electronically Signed   By: Monte Fantasia M.D.   On: 04/18/2015 05:20   US Abdomen Limited Ruq  04/18/2015  CLINICAL DATA:  Abdominal pain for 2 days. EXAM: US ABDOMEN LIMITED - RIGHT UPPER QUADRANT COMPARISON:  None. FINDINGS: Gallbladder: No gallstones or wall thickening visualized. No sonographic Murphy sign noted by sonographer. Common bile duct: Diameter: 5 mm Liver: No focal lesion identified. Within normal limits in parenchymal echogenicity. Antegrade flow in the imaged hepatic and portal venous system. IMPRESSION: Normal right upper quadrant ultrasound. Electronically Signed   By: Monte Fantasia M.D.   On: 04/18/2015 05:06    Assessment/Plan  Generalized weakness Will have him work with physical therapy and occupational therapy team to help with gait training and muscle strengthening exercises.fall precautions. Skin care. Encourage to be out of bed. Continue and complete his prednisone tapered course. Check vitamin d level. Reviewed tsh level. His statin could be contributing to his symptom  as well. Check CK  level  Vitamin B12 deficiency s/p vitamin B12 subcutaneous 1 dose. continue vitamin B12 1000 mcg daily and check vitamin B12 level in 1 month  Gout Off allopurinol and colchicine. Continue and complete prednisone taper and monitor. Continue tramadol 50 mg q6h prn pain.   Thrombocytopenia No bleed reported. Monitor platelet count  Constipation Continue senokot s and change miralax to daily as needed only  CAD Remains chest pain free. Continue baby aspirin, plavix, imdur and lopressor with prn NTG. Continue statin  HTN Monitor bp, continue imdur 30 mg bid and lopressor 25 mg bid.   HLD Continue zocor 80 mg daily.   Deranged LFT Check LFT. His statin could be contributing some. If LFT continues to rise and he has muscle aches, consider d/c of statin  Anemia of chronic disease monitor CBC   Goals of care: short term rehabilitation   Labs/tests ordered: cbc, cmp, ck, vitamin d , lft  Family/ staff Communication: reviewed care plan with patient and nursing supervisor    Blanchie Serve, MD Internal Medicine [place], [place] [address] Cell Phone (Monday-Friday 8 am - 5 pm): [phone] On Call: [phone] and follow prompts after 5 pm and on weekends Office Phone: [phone] Office Fax: [phone]

## 2015-04-26 ENCOUNTER — Encounter (HOSPITAL_COMMUNITY): Payer: Self-pay | Admitting: Emergency Medicine

## 2015-04-26 ENCOUNTER — Emergency Department (HOSPITAL_COMMUNITY)
Admission: EM | Admit: 2015-04-26 | Discharge: 2015-04-26 | Disposition: A | Discharge status: Home / Self Care | Payer: Medicare Other | Attending: Emergency Medicine | Admitting: Emergency Medicine

## 2015-04-26 DIAGNOSIS — Z87891 Personal history of nicotine dependence: Secondary | ICD-10-CM | POA: Insufficient documentation

## 2015-04-26 DIAGNOSIS — Z7902 Long term (current) use of antithrombotics/antiplatelets: Secondary | ICD-10-CM | POA: Insufficient documentation

## 2015-04-26 DIAGNOSIS — I2510 Atherosclerotic heart disease of native coronary artery without angina pectoris: Secondary | ICD-10-CM | POA: Insufficient documentation

## 2015-04-26 DIAGNOSIS — M109 Gout, unspecified: Secondary | ICD-10-CM | POA: Insufficient documentation

## 2015-04-26 DIAGNOSIS — Z862 Personal history of diseases of the blood and blood-forming organs and certain disorders involving the immune mechanism: Secondary | ICD-10-CM | POA: Insufficient documentation

## 2015-04-26 DIAGNOSIS — R339 Retention of urine, unspecified: Secondary | ICD-10-CM | POA: Insufficient documentation

## 2015-04-26 DIAGNOSIS — Z7952 Long term (current) use of systemic steroids: Secondary | ICD-10-CM | POA: Insufficient documentation

## 2015-04-26 DIAGNOSIS — E538 Deficiency of other specified B group vitamins: Secondary | ICD-10-CM | POA: Insufficient documentation

## 2015-04-26 DIAGNOSIS — Z79899 Other long term (current) drug therapy: Secondary | ICD-10-CM | POA: Diagnosis not present

## 2015-04-26 DIAGNOSIS — Z87448 Personal history of other diseases of urinary system: Secondary | ICD-10-CM | POA: Diagnosis not present

## 2015-04-26 DIAGNOSIS — Z7982 Long term (current) use of aspirin: Secondary | ICD-10-CM | POA: Insufficient documentation

## 2015-04-26 DIAGNOSIS — G8929 Other chronic pain: Secondary | ICD-10-CM | POA: Insufficient documentation

## 2015-04-26 DIAGNOSIS — K5900 Constipation, unspecified: Secondary | ICD-10-CM | POA: Diagnosis not present

## 2015-04-26 DIAGNOSIS — Z9889 Other specified postprocedural states: Secondary | ICD-10-CM | POA: Insufficient documentation

## 2015-04-26 DIAGNOSIS — E785 Hyperlipidemia, unspecified: Secondary | ICD-10-CM | POA: Insufficient documentation

## 2015-04-26 DIAGNOSIS — I10 Essential (primary) hypertension: Secondary | ICD-10-CM | POA: Diagnosis not present

## 2015-04-26 DIAGNOSIS — E1140 Type 2 diabetes mellitus with diabetic neuropathy, unspecified: Secondary | ICD-10-CM | POA: Insufficient documentation

## 2015-04-26 LAB — COMPREHENSIVE METABOLIC PANEL
ALT: 116 U/L — ABNORMAL HIGH (ref 17–63)
AST: 59 U/L — ABNORMAL HIGH (ref 15–41)
Albumin: 2.9 g/dL — ABNORMAL LOW (ref 3.5–5.0)
Alkaline Phosphatase: 91 U/L (ref 38–126)
Anion gap: 10 (ref 5–15)
BUN: 29 mg/dL — ABNORMAL HIGH (ref 6–20)
CO2: 22 mmol/L (ref 22–32)
Calcium: 8.5 mg/dL — ABNORMAL LOW (ref 8.9–10.3)
Chloride: 96 mmol/L — ABNORMAL LOW (ref 101–111)
Creatinine, Ser: 0.88 mg/dL (ref 0.61–1.24)
GFR calc Af Amer: >60 mL/min (ref >60)
GFR calc non Af Amer: >60 mL/min (ref >60)
Glucose, Bld: 259 mg/dL — ABNORMAL HIGH (ref 65–99)
Potassium: 4 mmol/L (ref 3.5–5.1)
Sodium: 128 mmol/L — ABNORMAL LOW (ref 135–145)
Total Bilirubin: 0.8 mg/dL (ref 0.3–1.2)
Total Protein: 5.8 g/dL — ABNORMAL LOW (ref 6.5–8.1)

## 2015-04-26 LAB — URINALYSIS, ROUTINE W REFLEX MICROSCOPIC
Bilirubin Urine: NEGATIVE
Glucose, UA: NEGATIVE mg/dL
Hgb urine dipstick: NEGATIVE
Ketones, ur: NEGATIVE mg/dL
Leukocytes, UA: NEGATIVE
Nitrite: NEGATIVE
Protein, ur: NEGATIVE mg/dL
Specific Gravity, Urine: 1.016 (ref 1.005–1.030)
pH: 6 (ref 5.0–8.0)

## 2015-04-26 LAB — CBC
HCT: 25.6 % — ABNORMAL LOW (ref 39.0–52.0)
Hemoglobin: 9.4 g/dL — ABNORMAL LOW (ref 13.0–17.0)
MCH: 31.1 pg (ref 26.0–34.0)
MCHC: 36.7 g/dL — ABNORMAL HIGH (ref 30.0–36.0)
MCV: 84.8 fL (ref 78.0–100.0)
Platelets: 132 10*3/uL — ABNORMAL LOW (ref 150–400)
RBC: 3.02 MIL/uL — ABNORMAL LOW (ref 4.22–5.81)
RDW: 15.8 % — ABNORMAL HIGH (ref 11.5–15.5)
WBC: 5.1 10*3/uL (ref 4.0–10.5)

## 2015-04-26 LAB — LIPASE, BLOOD: Lipase: 28 U/L (ref 11–51)

## 2015-04-26 NOTE — ED Notes (Addendum)
Per EMS, patient had a fall on 3/17 seen at [facility] for it. Patient was sent to [facility]. Ever since he arrived at [facility] on 3/18, patient has not had a BM; since yesterday patient has not been able to urinate. Patient reports stomach is distended to him; tender to palpate. Patient was given laxatives with some relief. Enema and supositories without relief.

## 2015-04-26 NOTE — ED Notes (Signed)
Discharge instructions and follow up care reviewed with patient and family member. Patient and family member verbalized understanding.

## 2015-04-26 NOTE — ED Notes (Signed)
MD at bedside. 

## 2015-04-26 NOTE — Discharge Instructions (Signed)
8 scoops of MiraLAX in 32 ounces of drink of your choice. Stay by a bathroom. Also given a fleets enema. By both of these over-the-counter. Constipation, Adult Constipation is when a person has fewer than three bowel movements a week, has difficulty having a bowel movement, or has stools that are dry, hard, or larger than normal. As people grow older, constipation is more common. A low-fiber diet, not taking in enough fluids, and taking certain medicines may make constipation worse.  CAUSES   Certain medicines, such as antidepressants, pain medicine, iron supplements, antacids, and water pills.   Certain diseases, such as diabetes, irritable bowel syndrome (IBS), thyroid disease, or depression.   Not drinking enough water.   Not eating enough fiber-rich foods.   Stress or travel.   Lack of physical activity or exercise.   Ignoring the urge to have a bowel movement.   Using laxatives too much.  SIGNS AND SYMPTOMS   Having fewer than three bowel movements a week.   Straining to have a bowel movement.   Having stools that are hard, dry, or larger than normal.   Feeling full or bloated.   Pain in the lower abdomen.   Not feeling relief after having a bowel movement.  DIAGNOSIS  Your health care provider will take a medical history and perform a physical exam. Further testing may be done for severe constipation. Some tests may include:  A barium enema X-ray to examine your rectum, colon, and, sometimes, your small intestine.   A sigmoidoscopy to examine your lower colon.   A colonoscopy to examine your entire colon. TREATMENT  Treatment will depend on the severity of your constipation and what is causing it. Some dietary treatments include drinking more fluids and eating more fiber-rich foods. Lifestyle treatments may include regular exercise. If these diet and lifestyle recommendations do not help, your health care provider may recommend taking over-the-counter  laxative medicines to help you have bowel movements. Prescription medicines may be prescribed if over-the-counter medicines do not work.  HOME CARE INSTRUCTIONS   Eat foods that have a lot of fiber, such as fruits, vegetables, whole grains, and beans.  Limit foods high in fat and processed sugars, such as french fries, hamburgers, cookies, candies, and soda.   A fiber supplement may be added to your diet if you cannot get enough fiber from foods.   Drink enough fluids to keep your urine clear or pale yellow.   Exercise regularly or as directed by your health care provider.   Go to the restroom when you have the urge to go. Do not hold it.   Only take over-the-counter or prescription medicines as directed by your health care provider. Do not take other medicines for constipation without talking to your health care provider first.  [place] IF:   You have bright red blood in your stool.   Your constipation lasts for more than 4 days or gets worse.   You have abdominal or rectal pain.   You have thin, pencil-like stools.   You have unexplained weight loss. MAKE SURE YOU:   Understand these instructions.  Will watch your condition.  Will get help right away if you are not doing well or get worse.   This information is not intended to replace advice given to you by your health care provider. Make sure you discuss any questions you have with your health care provider.   Document Released: 10/17/2003 Document Revised: 02/08/2014 Document Reviewed: 10/30/2012 Elsevier Interactive  Patient Education 2016 [employer].

## 2015-04-26 NOTE — ED Provider Notes (Signed)
CSN: [id number]     Arrival date & time 04/26/15  1356 History   First MD Initiated Contact with Patient 04/26/15 1418     Chief Complaint  Patient presents with  . Urinary Retention  . Constipation     (Consider location/radiation/quality/duration/timing/severity/associated sxs/prior Treatment) Patient is a 75 y.o. male presenting with constipation. The history is provided by the patient.  Constipation Severity:  Severe Time since last bowel movement:  8 days Timing:  Constant Progression:  Worsening Chronicity:  New Context: not medication and not narcotics   Stool description:  None produced Relieved by:  Nothing Worsened by:  Nothing tried Ineffective treatments:  None tried Associated symptoms: flatus   Associated symptoms: no abdominal pain, no diarrhea, no fever, no nausea and no vomiting    75 yo M With a chief complaint of constipation. This been going on for the past 8 days. Patient has not had a bowel movement since then. Has some crampy diffuse abdominal pain with this. Denies any vomiting, or nausea. Has been able to eat without difficulty. Positive flatus. Denies any recent medication changes. All started having urinary retention this morning. Felt like he is having trouble urinating. Mild suprapubic pain.  Past Medical History  Diagnosis Date  . Diabetes mellitus     with neuropathy  . Hypertension   . AAA (abdominal aortic aneurysm) ([place])   . Gout   . Vitamin B12 deficiency   . Hiatal hernia   . CAD (coronary artery disease)     with cath 09/26/08 showing severe heart disease in RCA with POBA, mild to mod diesease in circ and LAD  . Chronic back pain     back problmes. prior to surgery   . Hyperlipidemia   . Weakness   . Coronary atherosclerosis of native coronary artery   . AKI (acute kidney injury) ([place])   . Dehydration   . Diarrhea   . Thrombocytopenia ([place])   . Anemia of chronic disease   . Abnormal LFTs   . Metabolic acidosis    Past Surgical  History  Procedure Laterality Date  . Back surgery      x3  . Left ankle surgery    . Carpal tunnel release      L arm   . 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 Left heart catheterization with coronary angiogram N/A 01/21/2012    Procedure: LEFT HEART CATHETERIZATION WITH CORONARY ANGIOGRAM;  Surgeon: Burnell Blanks, MD;  [facility] C[facility];  Service: Cardiovascular;  Laterality: N/A;   Family History  Problem Relation Age of Onset  . Heart attack Mother   . Cancer Mother   . Heart attack Sister    Social History  Substance Use Topics  . Smoking status: Former Smoker    Types: Cigars  . Smokeless tobacco: Never Used     Comment: quit smoking cigars right before christmas 2014!  Marland Kitchen Alcohol Use: No    Review of Systems  Constitutional: Negative for fever and chills.  HENT: Negative for congestion and facial swelling.   Eyes: Negative for discharge and visual disturbance.  Respiratory: Negative for shortness of breath.   Cardiovascular: Negative for chest pain and palpitations.  Gastrointestinal: Positive for constipation and flatus. Negative for nausea, vomiting, abdominal pain and diarrhea.  Musculoskeletal: Negative for myalgias and arthralgias.  Skin: Negative for color change and rash.  Neurological: Negative for tremors, syncope and headaches.  Psychiatric/Behavioral: Negative for confusion and dysphoric  mood.      Allergies  Review of patient's allergies indicates no known allergies.  Home Medications   Prior to Admission medications   Medication Sig Start Date End Date Taking? Authorizing Provider  aspirin 81 MG chewable tablet Chew 81 mg by mouth daily.   Yes Historical Provider, MD  bisacodyl (DULCOLAX) 10 MG suppository Place 10 mg rectally daily as needed for moderate constipation.   Yes Historical Provider, MD  clobetasol cream (TEMOVATE) [id number] % Apply 1 application topically 2 (two) times daily. 03/12/13  Yes Denita Lung, MD  clopidogrel  (PLAVIX) 75 MG tablet Take 1 tablet by mouth  daily 10/22/14  Yes Denita Lung, MD  fish oil-omega-3 fatty acids 1000 MG capsule Take 2 g by mouth daily.    Yes Historical Provider, MD  furosemide (LASIX) 20 MG tablet Take 20 mg by mouth daily. x5 days. 04/23/15  Yes Historical Provider, MD  isosorbide mononitrate (IMDUR) 30 MG 24 hr tablet Take 30 mg by mouth 2 (two) times daily. Take at 9 am and 6 pm   Yes Historical Provider, MD  metoprolol tartrate (LOPRESSOR) 25 MG tablet Take 1 tablet by mouth two  times daily 10/22/14  Yes Denita Lung, MD  nitroGLYCERIN (NITROSTAT) 0.4 MG SL tablet Place 0.4 mg under the tongue every 5 (five) minutes as needed for chest pain. For 3 doses 10/12/11  Yes Denita Lung, MD  polyethylene glycol [facility] / GLYCOLAX) packet Take 17 g by mouth 2 (two) times daily. For 3 days. Then prn   Yes Historical Provider, MD  predniSONE (DELTASONE) 10 MG tablet Take 40mg  daily for 3days,Take 30mg  daily for 3days,Take 20mg  daily for 3days,Take 10mg  daily for 3days, then stop 04/22/15  Yes Lavina Hamman, MD  Protein (PROCEL) POWD Take 2 scoop by mouth 2 (two) times daily.   Yes Historical Provider, MD  senna (SENOKOT) 8.6 MG tablet Take 2 tablets by mouth 2 (two) times daily.   Yes Historical Provider, MD  simvastatin (ZOCOR) 80 MG tablet Take 1 tablet by mouth at  bedtime 10/22/14  Yes Denita Lung, MD  traMADol (ULTRAM) 50 MG tablet Take by mouth every 4 (four) hours as needed.   Yes Historical Provider, MD  vitamin B-12 (CYANOCOBALAMIN) 1000 MCG tablet Take 1,000 mcg by mouth daily.   Yes Historical Provider, MD   BP 144/77 mmHg  Pulse 74  Temp(Src) 98.3 F (36.8 C) (Oral)  Resp 16  Ht 5\' 10"  (1.778 m)  Wt 211 lb (95.709 kg)  BMI 30.28 kg/m2  SpO2 99% Physical Exam  Constitutional: He is oriented to person, place, and time. He appears well-developed and well-nourished.  HENT:  Head: Normocephalic and atraumatic.  Eyes: EOM are normal. Pupils are equal, round, and  reactive to light.  Neck: Normal range of motion. Neck supple. No JVD present.  Cardiovascular: Normal rate and regular rhythm.  Exam reveals no gallop and no friction rub.   No murmur heard. Pulmonary/Chest: No respiratory distress. He has no wheezes.  Abdominal: He exhibits no distension. There is no tenderness. There is no rebound and no guarding.  Benign abdomen   Genitourinary:  No noted stool in the vault  Musculoskeletal: Normal range of motion.  Neurological: He is alert and oriented to person, place, and time.  Skin: No rash noted. No pallor.  Psychiatric: He has a normal mood and affect. His behavior is normal.  Nursing note and vitals reviewed.   ED Course  Procedures (  including critical care time) Labs Review Labs Reviewed  COMPREHENSIVE METABOLIC PANEL - Abnormal; Notable for the following:    Sodium 128 (*)    Chloride 96 (*)    Glucose, Bld 259 (*)    BUN 29 (*)    Calcium 8.5 (*)    Total Protein 5.8 (*)    Albumin 2.9 (*)    AST 59 (*)    ALT 116 (*)    All other components within normal limits  CBC - Abnormal; Notable for the following:    RBC 3.02 (*)    Hemoglobin 9.4 (*)    HCT 25.6 (*)    MCHC 36.7 (*)    RDW 15.8 (*)    Platelets 132 (*)    All other components within normal limits  LIPASE, BLOOD  URINALYSIS, ROUTINE W REFLEX MICROSCOPIC (NOT AT [facility])    Imaging Review No results found. I have personally reviewed and evaluated these images and lab results as part of my medical decision-making.   EKG Interpretation None      Emergency Focused Ultrasound Exam Limited ultrasound of the Bladder.   Performed and interpreted by Dr. Tyrone Nine Indication: evaluation for urinary retention Transverse and Sagittal views of the bladder are obtained and calculations are performed to determine an estimated bladder volume for signs of post-renal obstruction.  Findings: Bladder is full 275 cc of urine Interpretation:  evidence of outlet  obstruction Images archived electronically.  CPT Codes: [id number] and [phone]  MDM   Final diagnoses:  Constipation, unspecified constipation type    75 yo M with a chief complaint of constipation. No stool noted in the vault. Patient with 270 mL of urine in his bladder. We'll attempt to have the patient urinated and then recheck the bladder.   Patient was able to urinate about 300cc of urine.  Feeling better.  Continues to have benign abdominal exam. Will d/c home with miralax clean out, enema.  PCP follow up.   I have discussed the diagnosis/risks/treatment options with the patient and family and believe the pt to be eligible for discharge home to follow-up with PCP. We also discussed returning to the ED immediately if new or worsening sx occur. We discussed the sx which are most concerning (e.g., sudden worsening pain, fever, inability to tolerate by mouth) that necessitate immediate return. Medications administered to the patient during their visit and any new prescriptions provided to the patient are listed below.  Medications given during this visit Medications - No data to display  Discharge Medication List as of 04/26/2015  3:19 PM      The patient appears reasonably screen and/or stabilized for discharge and I doubt any other medical condition or other [facility] requiring further screening, evaluation, or treatment in the ED at this time prior to discharge.    Deno Etienne, DO 04/26/15 1752

## 2015-04-26 NOTE — ED Notes (Signed)
MD at bedside to complete bladder scan.

## 2015-04-28 LAB — HEPATIC FUNCTION PANEL
ALT: 92 U/L — AB (ref 10–40)
AST: 41 U/L — AB (ref 14–40)
Alkaline Phosphatase: 85 U/L (ref 25–125)
Bilirubin, Total: 1 mg/dL

## 2015-04-29 ENCOUNTER — Encounter: Payer: Self-pay | Admitting: Adult Health

## 2015-04-29 ENCOUNTER — Non-Acute Institutional Stay (SKILLED_NURSING_FACILITY): Payer: Medicare Other | Admitting: Adult Health

## 2015-04-29 DIAGNOSIS — E559 Vitamin D deficiency, unspecified: Secondary | ICD-10-CM

## 2015-04-29 NOTE — Progress Notes (Signed)
Patient ID: Paul [name], male   DOB: [birth year], 75 y.o.   MRN: [id number]    DATE:  04/29/15   MRN:  [id number]  BIRTHDAY: [birth year]  Facility:  Nursing Home Location:  [place] Room Number: [id number]  LEVEL OF CARE:  SNF [phone])  Contact Information    Name Relation Home Work Mobile   [place] Sister [phone]     [place], [place] [phone]  [phone]       Code Status History    Date Active Date Inactive Code Status Order ID Comments User Context   04/18/2015  8:19 AM 04/22/2015 11:38 PM Full Code [id number]  Robbie Lis, MD Inpatient       Chief Complaint  Patient presents with  . Acute Visit    Vitamin D deficiency    HISTORY OF PRESENT ILLNESS:  This is a 75 year old male who has been noted to have vitamin D level 28.52, low. Patient verbalized that he used to take Vitamin D and MVI with minerals before being hospitalized.  He has been admitted to [facility] on 04/22/15 from [facility]. He has PMH of CAD with stent RCA, diabetes mellitus, bilateral carotid artery disease, hypertension and hyperlipidemia. Patient reported having diarrhea X 1  1/2 week, poor PO intake resulting into generalized weakness with near fall episode. ED work-up showed hgb 11, platelet 88, CO2 17, creatinine 1.82, glucose 163, AST 211 and ALP 147. Vitamin B12 level was 153 and was given Vitamin B 12 SQ X 1. He has been taking Colchicine and allopurinol intermittently which possibly causing patient's acute gout and thrombocytopenia. He was started on oral Prednisone.   He has been admitted for a short-term rehabilitation.  PAST MEDICAL HISTORY:  Past Medical History  Diagnosis Date  . Diabetes mellitus     with neuropathy  . Hypertension   . AAA (abdominal aortic aneurysm) ([place])   . Gout of right hand   . Vitamin B12 deficiency   . Hiatal hernia   . CAD (coronary artery disease)     with cath 09/26/08 showing severe heart  disease in RCA with POBA, mild to mod diesease in circ and LAD  . Chronic back pain     back problems prior to surgery   . Hyperlipidemia   . Weakness   . Coronary atherosclerosis of native coronary artery   . AKI (acute kidney injury) ([place])   . Dehydration   . Diarrhea   . Thrombocytopenia ([place])   . Anemia of chronic disease   . Abnormal LFTs   . Metabolic acidosis   . Generalized weakness   . Vitamin B12 deficiency   . Atherosclerosis of native coronary artery of native heart without angina pectoris   . Constipation   . Elevated liver enzymes      CURRENT MEDICATIONS: Reviewed  Patient's Medications  New Prescriptions   No medications on file  Previous Medications   ASPIRIN 81 MG CHEWABLE TABLET    Chew 81 mg by mouth daily.   BISACODYL (DULCOLAX) 10 MG SUPPOSITORY    Place 10 mg rectally daily as needed for moderate constipation.   CHOLECALCIFEROL (VITAMIN D-3 PO)    Take 1,000 Units by mouth daily.   CLOBETASOL CREAM (TEMOVATE) 0.05 %    Apply 1 application topically 2 (two) times daily.   CLOPIDOGREL (PLAVIX) 75 MG TABLET    Take 1 tablet by mouth  daily   FISH OIL-OMEGA-3 FATTY ACIDS 1000 MG  CAPSULE    Take 2 g by mouth daily.    ISOSORBIDE MONONITRATE (IMDUR) 30 MG 24 HR TABLET    Take 30 mg by mouth 2 (two) times daily. Take at 9 am and 6 pm   METOPROLOL TARTRATE (LOPRESSOR) 25 MG TABLET    Take 1 tablet by mouth two  times daily   MULTIPLE VITAMINS-MINERALS (CENTRUM) TABLET    Take 1 tablet by mouth daily.   NITROGLYCERIN (NITROSTAT) 0.4 MG SL TABLET    Place 0.4 mg under the tongue every 5 (five) minutes as needed for chest pain. For 3 doses   POLYETHYLENE GLYCOL (MIRALAX / GLYCOLAX) PACKET    Take 17 g by mouth daily as needed.    PREDNISONE (DELTASONE) 10 MG TABLET    Take 10 mg by mouth daily with breakfast. Take 20 mg QD x3 days, then 10 mg QD x3 days, then stop   PROTEIN (PROCEL) POWD    Take 2 scoop by mouth 2 (two) times daily.   SENNOSIDES-DOCUSATE SODIUM  (SENOKOT-S) 8.6-50 MG TABLET    Take 2 tablets by mouth 2 (two) times daily.   SIMVASTATIN (ZOCOR) 80 MG TABLET    Take 1 tablet by mouth at  bedtime   TRAMADOL (ULTRAM) 50 MG TABLET    Take by mouth every 4 (four) hours as needed.   VITAMIN B-12 (CYANOCOBALAMIN) 1000 MCG TABLET    Take 1,000 mcg by mouth daily.  Modified Medications   No medications on file  Discontinued Medications   FUROSEMIDE (LASIX) 20 MG TABLET    Take 20 mg by mouth daily. x5 days.   PREDNISONE (DELTASONE) 10 MG TABLET    Take 40mg  daily for 3days,Take 30mg  daily for 3days,Take 20mg  daily for 3days,Take 10mg  daily for 3days, then stop   SENNA (SENOKOT) 8.6 MG TABLET    Take 2 tablets by mouth 2 (two) times daily.     No Known Allergies   REVIEW OF SYSTEMS:  GENERAL: no change in appetite, no fatigue, no weight changes, no fever, chills or weakness EYES: Denies change in vision, dry eyes, eye pain, itching or discharge EARS: Denies change in hearing, ringing in ears, or earache NOSE: Denies nasal congestion or epistaxis MOUTH and THROAT: Denies oral discomfort, gingival pain or bleeding, pain from teeth or hoarseness   RESPIRATORY: no cough, SOB, DOE, wheezing, hemoptysis CARDIAC: no chest pain, edema or palpitations GI: no abdominal pain, diarrhea, heart burn, nausea or vomiting, +constipation GU: Denies dysuria, frequency, hematuria, incontinence, or discharge PSYCHIATRIC: Denies feeling of depression or anxiety. No report of hallucinations, insomnia, paranoia, or agitation   PHYSICAL EXAMINATION  GENERAL APPEARANCE: Well nourished. In no acute distress. Normal body habitus SKIN:  Skin is warm and dry.  HEAD: Normal in size and contour. No evidence of trauma EYES: Lids open and close normally. No blepharitis, entropion or ectropion. PERRL. Conjunctivae are clear and sclerae are white. Lenses are without opacity EARS: Pinnae are normal. Patient hears normal voice tunes of the examiner MOUTH and THROAT:  Lips are without lesions. Oral mucosa is moist and without lesions. 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 Affect and behavior are appropriate  LABS/RADIOLOGY: Labs reviewed: 04/28/15  total protein 5.2 albumin 2.92 globulin 2.3 total bilirubin 1.00 direct bilirubin 0.28 alkaline phosphatase 85 SGOT 41 SGPT 92 total creatinine kinase 62 vitamin D [id number] Basic Metabolic Panel:  Recent Labs  04/20/15 0540 04/21/15 0430 04/22/15 04/22/15 0452 04/26/15 1419  NA 139 138 136* 136 128*  K 3.9 4.0  --  4.0 4.0  CL 113* 110  --  106 96*  CO2 18* 19*  --  21* 22  GLUCOSE 108* 144*  --  169* 259*  BUN 34* 26* 32* 32* 29*  CREATININE 0.97 0.83 1.0 0.95 0.88  CALCIUM 8.5* 8.7*  --  8.7* 8.5*  MG 1.4*  --   --   --   --    Liver Function Tests:  Recent Labs  04/21/15 0430 04/22/15 0452 04/26/15 1419  AST 93* 83* 59*  ALT 124* 116* 116*  ALKPHOS 99 88 91  BILITOT 0.8 0.8 0.8  PROT 5.8* 5.8* 5.8*  ALBUMIN 2.6* 2.6* 2.9*    Recent Labs  04/18/15 0154 04/26/15 1419  LIPASE 20 28   CBC:  Recent Labs  04/20/15 0540 04/21/15 0430 04/22/15 04/22/15 0452 04/26/15 1419  WBC 5.9 5.2 6.1 6.1 5.1  NEUTROABS 4.3 4.1  --  4.0  --   HGB 9.3* 8.9*  --  8.9* 9.4*  HCT 25.6* 25.2*  --  25.2* 25.6*  MCV 84.8 87.8  --  87.5 84.8  PLT 57* 51*  --  75* 132*   Lipid Panel:  Recent Labs  03/03/15 0001  HDL 30*   CBG:  Recent Labs  04/21/15 2116 04/22/15 0751 04/22/15 1153  GLUCAP 236* 197* 140*       US Aorta  04/18/2015  CLINICAL DATA:  Weakness.  History of abdominal aortic aneurysm. EXAM: ULTRASOUND OF ABDOMINAL AORTA TECHNIQUE: Ultrasound examination of the abdominal aorta was performed to evaluate for abdominal aortic aneurysm. COMPARISON:  11/07/2014 FINDINGS: When remeasured in the same manner on prior there is unchanged fusiform aneurysmal enlargement of the distal aorta with a maximal diameter of 37 mm. The aorta is diffusely atheromatous with wall thickening and calcification. No visible dissection flap or retroperitoneal fluid. IMPRESSION: Fusiform infrarenal aortic aneurysm with unchanged dimensions compared to 2016. Maximal diameter is 37 mm. Electronically Signed   By: Monte Fantasia M.D.   On: 04/18/2015 05:20   US Abdomen Limited Ruq  04/18/2015  CLINICAL DATA:  Abdominal pain for 2 days. EXAM: US ABDOMEN LIMITED - RIGHT UPPER QUADRANT COMPARISON:  None. FINDINGS: Gallbladder: No gallstones or wall thickening visualized. No sonographic Murphy sign noted by sonographer. Common bile duct: Diameter: 5 mm Liver: No focal lesion identified. Within normal limits in parenchymal echogenicity. Antegrade flow in the imaged hepatic and portal venous system. IMPRESSION: Normal right upper quadrant ultrasound. Electronically Signed   By: Monte Fantasia M.D.   On: 04/18/2015 05:06    ASSESSMENT/PLAN:  Vitamin D deficiency - start vitamin D3 1000 units 1 tab by mouth daily and multivitamins with minerals 1 tab by mouth daily    [facility], NP [place]

## 2015-04-30 NOTE — Progress Notes (Signed)
G-Codes for PT evaluation    04/18/15 1413  PT Time Calculation  PT Start Time (ACUTE ONLY) 1354  PT Stop Time (ACUTE ONLY) 1405  PT Time Calculation (min) (ACUTE ONLY) 11 min  PT G-Codes **NOT FOR INPATIENT CLASS**  Functional Assessment Tool Used clinical judgement  Functional Limitation Mobility: Walking and moving around  Mobility: Walking and Moving Around Current Status [id number]) CJ  Mobility: Walking and Moving Around Goal Status [id number]) CI  PT General Charges  $$ ACUTE PT VISIT 1 Procedure  PT Evaluation  $PT Eval Low Complexity 1 Procedure   Carmelia Bake, PT, DPT 04/30/2015 Pager: [phone]

## 2015-05-01 ENCOUNTER — Encounter: Payer: Self-pay | Admitting: Adult Health

## 2015-05-01 ENCOUNTER — Non-Acute Institutional Stay (SKILLED_NURSING_FACILITY): Payer: Medicare Other | Admitting: Adult Health

## 2015-05-01 DIAGNOSIS — R531 Weakness: Secondary | ICD-10-CM

## 2015-05-01 DIAGNOSIS — E538 Deficiency of other specified B group vitamins: Secondary | ICD-10-CM

## 2015-05-01 DIAGNOSIS — E119 Type 2 diabetes mellitus without complications: Secondary | ICD-10-CM

## 2015-05-01 DIAGNOSIS — M10041 Idiopathic gout, right hand: Secondary | ICD-10-CM | POA: Diagnosis not present

## 2015-05-01 DIAGNOSIS — D696 Thrombocytopenia, unspecified: Secondary | ICD-10-CM

## 2015-05-01 DIAGNOSIS — E43 Unspecified severe protein-calorie malnutrition: Secondary | ICD-10-CM | POA: Diagnosis not present

## 2015-05-01 DIAGNOSIS — I10 Essential (primary) hypertension: Secondary | ICD-10-CM | POA: Diagnosis not present

## 2015-05-01 DIAGNOSIS — I2510 Atherosclerotic heart disease of native coronary artery without angina pectoris: Secondary | ICD-10-CM

## 2015-05-01 DIAGNOSIS — M109 Gout, unspecified: Secondary | ICD-10-CM

## 2015-05-01 DIAGNOSIS — E559 Vitamin D deficiency, unspecified: Secondary | ICD-10-CM | POA: Diagnosis not present

## 2015-05-01 DIAGNOSIS — E785 Hyperlipidemia, unspecified: Secondary | ICD-10-CM | POA: Diagnosis not present

## 2015-05-01 DIAGNOSIS — D638 Anemia in other chronic diseases classified elsewhere: Secondary | ICD-10-CM

## 2015-05-01 DIAGNOSIS — K5901 Slow transit constipation: Secondary | ICD-10-CM | POA: Diagnosis not present

## 2015-05-01 NOTE — Progress Notes (Signed)
Patient ID: Paul [name], male   DOB: 10/21/1940, 75 y.o.   MRN: [id number]    DATE:  05/01/15   MRN:  [id number]  BIRTHDAY: 1940/08/22  Facility:  Nursing Home Location:  [place] Room Number: [id number]  LEVEL OF CARE:  SNF [phone])  Contact Information    Name Relation Home Work Mobile   [place] Sister [phone]     [place], [place] [phone]  [phone]       Code Status History    Date Active Date Inactive Code Status Order ID Comments User Context   04/18/2015  8:19 AM 04/22/2015 11:38 PM Full Code [id number]  Robbie Lis, MD Inpatient       Chief Complaint  Patient presents with  . Discharge Note    HISTORY OF PRESENT ILLNESS:  This is a 75 year old male who is for discharge home with Home health OT for ADL skills and PT for endurance. He has requested to go to ER and was sent back to [facility] for constipation.   He has been admitted to [facility] on 04/22/15 from [facility]. He has PMH of CAD with stent RCA, diabetes mellitus, bilateral carotid artery disease, hypertension and hyperlipidemia. Patient reported having diarrhea X 1  1/2 week, poor PO intake resulting into generalized weakness with near fall episode. ED work-up showed hgb 11, platelet 88, CO2 17, creatinine 1.82, glucose 163, AST 211 and ALP 147. Vitamin B12 level was 153 and was given Vitamin B 12 SQ X 1. He has been taking Colchicine and allopurinol intermittently which possibly causing patient's acute gout and thrombocytopenia. He was started on oral Prednisone.  Patient was admitted to this facility for short-term rehabilitation after the patient's recent hospitalization.  Patient has completed SNF rehabilitation and therapy has cleared the patient for discharge.   PAST MEDICAL HISTORY:  Past Medical History  Diagnosis Date  . Diabetes mellitus     with neuropathy  . Hypertension   . AAA (abdominal aortic aneurysm) ([place])   . Gout of  right hand   . Vitamin B12 deficiency   . Hiatal hernia   . CAD (coronary artery disease)     with cath 09/26/08 showing severe heart disease in RCA with POBA, mild to mod diesease in circ and LAD  . Chronic back pain     back problems prior to surgery   . Hyperlipidemia   . Weakness   . Coronary atherosclerosis of native coronary artery   . AKI (acute kidney injury) ([place])   . Dehydration   . Diarrhea   . Thrombocytopenia ([place])   . Anemia of chronic disease   . Abnormal LFTs   . Metabolic acidosis   . Generalized weakness   . Vitamin B12 deficiency   . Atherosclerosis of native coronary artery of native heart without angina pectoris   . Constipation   . Elevated liver enzymes   . Vitamin D deficiency      CURRENT MEDICATIONS: Reviewed  Patient's Medications  New Prescriptions   No medications on file  Previous Medications   ASPIRIN 81 MG CHEWABLE TABLET    Chew 81 mg by mouth daily.   BISACODYL (DULCOLAX) 10 MG SUPPOSITORY    Place 10 mg rectally daily as needed for moderate constipation.   CHOLECALCIFEROL (VITAMIN D-3 PO)    Take 1,000 Units by mouth daily.   CLOBETASOL CREAM (TEMOVATE) 0.05 %    Apply 1 application topically 2 (two) times  daily.   CLOPIDOGREL (PLAVIX) 75 MG TABLET    Take 1 tablet by mouth  daily   FISH OIL-OMEGA-3 FATTY ACIDS 1000 MG CAPSULE    Take 2 g by mouth daily.    ISOSORBIDE MONONITRATE (IMDUR) 30 MG 24 HR TABLET    Take 30 mg by mouth 2 (two) times daily. Take at 9 am and 6 pm   METOPROLOL TARTRATE (LOPRESSOR) 25 MG TABLET    Take 1 tablet by mouth two  times daily   MULTIPLE VITAMINS-MINERALS (CENTRUM) TABLET    Take 1 tablet by mouth daily.   NITROGLYCERIN (NITROSTAT) 0.4 MG SL TABLET    Place 0.4 mg under the tongue every 5 (five) minutes as needed for chest pain. For 3 doses   POLYETHYLENE GLYCOL (MIRALAX / GLYCOLAX) PACKET    Take 17 g by mouth daily as needed.    PREDNISONE (DELTASONE) 10 MG TABLET    Take 10 mg by mouth daily with  breakfast. Take 20 mg QD x3 days, then 10 mg QD x3 days, then stop   PROTEIN (PROCEL) POWD    Take 2 scoop by mouth 2 (two) times daily.   SENNOSIDES-DOCUSATE SODIUM (SENOKOT-S) 8.6-50 MG TABLET    Take 2 tablets by mouth 2 (two) times daily.   SIMVASTATIN (ZOCOR) 80 MG TABLET    Take 1 tablet by mouth at  bedtime   TRAMADOL (ULTRAM) 50 MG TABLET    Take by mouth every 4 (four) hours as needed.   VITAMIN B-12 (CYANOCOBALAMIN) 1000 MCG TABLET    Take 1,000 mcg by mouth daily.  Modified Medications   No medications on file  Discontinued Medications   No medications on file     No Known Allergies   REVIEW OF SYSTEMS:  GENERAL: no change in appetite, no fatigue, no weight changes, no fever, chills or weakness EYES: Denies change in vision, dry eyes, eye pain, itching or discharge EARS: Denies change in hearing, ringing in ears, or earache NOSE: Denies nasal congestion or epistaxis MOUTH and THROAT: Denies oral discomfort, gingival pain or bleeding, pain from teeth or hoarseness   RESPIRATORY: no cough, SOB, DOE, wheezing, hemoptysis CARDIAC: no chest pain, edema or palpitations GI: no abdominal pain, diarrhea, heart burn, nausea or vomiting GU: Denies dysuria, frequency, hematuria, incontinence, or discharge PSYCHIATRIC: Denies feeling of depression or anxiety. No report of hallucinations, insomnia, paranoia, or agitation   PHYSICAL EXAMINATION  GENERAL APPEARANCE: Well nourished. In no acute distress. Normal body habitus SKIN:  Skin is warm and dry.  HEAD: Normal in size and contour. No evidence of trauma EYES: Lids open and close normally. No blepharitis, entropion or ectropion. PERRL. Conjunctivae are clear and sclerae are white. Lenses are without opacity EARS: Pinnae are normal. Patient hears normal voice tunes of the examiner MOUTH and THROAT: Lips are without lesions. Oral mucosa is moist and without lesions. 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 Affect and behavior are appropriate  LABS/RADIOLOGY: Labs reviewed: 04/25/15  hgbA1c 7.7 Basic Metabolic Panel:  Recent Labs  04/20/15 0540 04/21/15 0430  04/22/15 0452 04/25/15 04/26/15 1419  NA 139 138  < > 136 135* 128*  K 3.9 4.0  --  4.0 3.8 4.0  CL 113* 110  --  106  --  96*  CO2 18* 19*  --  21*  --  22  GLUCOSE 108* 144*  --  169*  --  259*  BUN 34* 26*  < > 32* 31* 29*  CREATININE 0.97 0.83  < > 0.95 0.9 0.88  CALCIUM 8.5* 8.7*  --  8.7*  --  8.5*  MG 1.4*  --   --   --   --   --   < > = values in this interval not displayed. Liver Function Tests:  Recent Labs  04/21/15 0430 04/22/15 0452 04/25/15 04/26/15 1419 04/28/15  AST 93* 83* 43* 59* 41*  ALT 124* 116* 100* 116* 92*  ALKPHOS 99 88 89 91 85  BILITOT 0.8 0.8  --  0.8  --   PROT 5.8* 5.8*  --  5.8*  --   ALBUMIN 2.6* 2.6*  --  2.9*  --     Recent Labs  04/18/15 0154 04/26/15 1419  LIPASE 20 28   CBC:  Recent Labs  04/21/15 0430  04/22/15 0452 04/25/15 04/26/15 1419  WBC 5.2  < > 6.1 6.2 5.1  NEUTROABS 4.1  --  4.0 3  --   HGB 8.9*  --  8.9* 9.0* 9.4*  HCT 25.2*  --  25.2* 26* 25.6*  MCV 87.8  --  87.5  --  84.8  PLT 51*  --  75* 133* 132*  < > = values in this interval not displayed. Lipid Panel:  Recent Labs  03/03/15 0001  HDL 30*   CBG:  Recent Labs  04/21/15 2116 04/22/15 0751 04/22/15 1153  GLUCAP 236* 197* 140*      US Aorta  04/18/2015  CLINICAL DATA:  Weakness.  History of abdominal aortic aneurysm.  EXAM: ULTRASOUND OF ABDOMINAL AORTA TECHNIQUE: Ultrasound examination of the abdominal aorta was performed to evaluate for abdominal aortic aneurysm. COMPARISON:  11/07/2014 FINDINGS: When remeasured in the same manner on prior there is unchanged fusiform aneurysmal enlargement of the distal aorta with a maximal diameter of 37 mm. The aorta is diffusely atheromatous with wall thickening and calcification. No visible dissection flap or retroperitoneal fluid. IMPRESSION: Fusiform infrarenal aortic aneurysm with unchanged dimensions compared to 2016. Maximal diameter is 37 mm. Electronically Signed   By: Monte Fantasia M.D.   On: 04/18/2015 05:20   US Abdomen Limited Ruq  04/18/2015  CLINICAL DATA:  Abdominal pain for 2 days. EXAM: US ABDOMEN LIMITED - RIGHT UPPER QUADRANT COMPARISON:  None. FINDINGS: Gallbladder: No gallstones or wall thickening visualized. No sonographic Murphy sign noted by sonographer. Common bile duct: Diameter: 5 mm Liver: No focal lesion identified. Within normal limits in parenchymal echogenicity. Antegrade flow in the imaged hepatic and portal venous system. IMPRESSION: Normal right upper quadrant ultrasound. Electronically Signed   By: Monte Fantasia M.D.   On: 04/18/2015 05:06    ASSESSMENT/PLAN:  Generalized weakness - for Home health PT and OT  Vitamin B12 deficiency - B12 level 153; given vitamin B12 subcutaneous 1 dose in the hospital ; continue vitamin B12 1000 g daily and check vitamin B12 level in 1 month through PCP  Elevated LFTs - likely due to dehydration; Resolved  Recurrent gout - allopurinol and colchicine were discontinued; continue prednisone 10 mg daily X  2 days and Ultram 50 mg 1 tab by mouth every 6 hours when necessary  Type 2 diabetes mellitus, controlled -  hemoglobin A1c 7.7; will follow-up with PCP  Chronic thrombocytopenia - platelet 75; no active bleeding; allopurinol and colchicine  Were discontinued since they can cause thrombocytopenia;  referred to hematology; re-check platelet 133, better  Coronary artery disease - no active chest pain, has significant RCA obstruction not amenable to PCI; continue Imdur 30 mg 24 hour 1 tab by mouth daily, NTG when necessary, aspirin 81 mg daily and Plavix 75 mg by mouth daily   Protein calorie malnutrition, severe - albumin 2.6; continue Procel 2 scoops by mouth twice a day  Constipation - continue senna S2 tabs by mouth twice a day and MiraLAX 17 g by mouth Q D PRN  Hyperlipidemia - continue Zocor 80 mg 1 tab by mouth daily at bedtime  Anemia of chronic disease - hemoglobin 8.9; re-check hgb 9.0, stable  Vitamin D deficiency - continue Vitamin D3 1,000 units 1 tab PO daily  Hypertension - continue Metoprolol tartrate 25 mg 1 tab PO BID and Imdur ER 30 mg 1 tab PO BID     I have filled out patient's discharge paperwork and written prescriptions.  Patient will receive home health PT and OT.   Discharge time: Greater than 30 minutes  Discharge time involved coordination of the discharge process with social worker, nursing staff and therapy department. Medical justification for home health services verified.      [facility], NP [employer] [phone]

## 2015-05-07 ENCOUNTER — Other Ambulatory Visit: Payer: Self-pay | Admitting: Family Medicine

## 2015-05-09 ENCOUNTER — Ambulatory Visit (INDEPENDENT_AMBULATORY_CARE_PROVIDER_SITE_OTHER): Payer: Medicare Other | Admitting: Family Medicine

## 2015-05-09 ENCOUNTER — Encounter: Payer: Self-pay | Admitting: Family Medicine

## 2015-05-09 VITALS — BP 110/60 | HR 68 | Ht 70.0 in | Wt 210.0 lb

## 2015-05-09 DIAGNOSIS — N179 Acute kidney failure, unspecified: Secondary | ICD-10-CM | POA: Diagnosis not present

## 2015-05-09 DIAGNOSIS — E119 Type 2 diabetes mellitus without complications: Secondary | ICD-10-CM | POA: Diagnosis not present

## 2015-05-09 DIAGNOSIS — D638 Anemia in other chronic diseases classified elsewhere: Secondary | ICD-10-CM

## 2015-05-09 DIAGNOSIS — E559 Vitamin D deficiency, unspecified: Secondary | ICD-10-CM

## 2015-05-09 DIAGNOSIS — E538 Deficiency of other specified B group vitamins: Secondary | ICD-10-CM | POA: Diagnosis not present

## 2015-05-09 DIAGNOSIS — D696 Thrombocytopenia, unspecified: Secondary | ICD-10-CM

## 2015-05-09 DIAGNOSIS — R531 Weakness: Secondary | ICD-10-CM

## 2015-05-09 LAB — COMPREHENSIVE METABOLIC PANEL
ALT: 59 U/L — ABNORMAL HIGH (ref 9–46)
AST: 29 U/L (ref 10–35)
Albumin: 3 g/dL — ABNORMAL LOW (ref 3.6–5.1)
Alkaline Phosphatase: 73 U/L (ref 40–115)
BUN: 10 mg/dL (ref 7–25)
CO2: 22 mmol/L (ref 20–31)
Calcium: 8.7 mg/dL (ref 8.6–10.3)
Chloride: 104 mmol/L (ref 98–110)
Creat: 0.84 mg/dL (ref 0.70–1.18)
Glucose, Bld: 128 mg/dL — ABNORMAL HIGH (ref 65–99)
Potassium: 4.2 mmol/L (ref 3.5–5.3)
Sodium: 137 mmol/L (ref 135–146)
Total Bilirubin: 0.8 mg/dL (ref 0.2–1.2)
Total Protein: 5.5 g/dL — ABNORMAL LOW (ref 6.1–8.1)

## 2015-05-09 LAB — CBC WITH DIFFERENTIAL/PLATELET
Basophils Absolute: 0 cells/uL (ref 0–200)
Basophils Relative: 0 %
Eosinophils Absolute: 120 cells/uL (ref 15–500)
Eosinophils Relative: 2 %
HCT: 30.2 % — ABNORMAL LOW (ref 38.5–50.0)
Hemoglobin: 10.1 g/dL — ABNORMAL LOW (ref 13.2–17.1)
Lymphocytes Relative: 39 %
Lymphs Abs: 2340 cells/uL (ref 850–3900)
MCH: 31 pg (ref 27.0–33.0)
MCHC: 33.4 g/dL (ref 32.0–36.0)
MCV: 92.6 fL (ref 80.0–100.0)
MPV: 10.3 fL (ref 7.5–12.5)
Monocytes Absolute: 480 cells/uL (ref 200–950)
Monocytes Relative: 8 %
Neutro Abs: 3060 cells/uL (ref 1500–7800)
Neutrophils Relative %: 51 %
Platelets: 147 10*3/uL (ref 140–400)
RBC: 3.26 MIL/uL — ABNORMAL LOW (ref 4.20–5.80)
RDW: 17.4 % — ABNORMAL HIGH (ref 11.0–15.0)
WBC: 6 10*3/uL (ref 4.0–10.5)

## 2015-05-09 MED ORDER — VITAMIN B-12 1000 MCG PO TABS: 1000.0000 ug | ORAL_TABLET | Freq: Every day | ORAL | Status: DC

## 2015-05-09 NOTE — Progress Notes (Signed)
   Subjective:    Patient ID: Paul [name], male    DOB: 12/10/1940, 75 y.o.   MRN: [id number]  HPI He is here for a follow-up visit. He was admitted to the hospital on March 17 and treated for dehydration, diarrhea, acute kidney issues as well as his underlying diabetes and gout. He was then sent to a skilled nursing facility on March 21 to help with his rehabilitation. He remained quite weak. While in the skilled nursing facility had difficulty with constipation which needed to be treated with MiraLAX etc. He was subsequently discharged to home on March 30. He was found to have vitamin B12 deficiency and question of whether there is vitamin D deficiency. His liver function tests as well as blood count did show abnormalities including thrombocytopenia. At this time he seems to be doing well and mainly complaining of fatigue. Recent hemoglobin A1c is 7.7.   Review of Systems     Objective:   Physical Exam Alert and in no distress with appropriate affect. He does not appear in any danger. His medications were reviewed. Presently he is taking fish oil, multivitamin, iron supplement, aspirin, metformin, quinapril, Plavix, isosorbide, metoprolol and Zocor. He is comfortable with this regimen.       Assessment & Plan:  Vitamin B12 deficiency - Plan: vitamin B-12 (CYANOCOBALAMIN) 1000 MCG tablet  Weakness - Plan: CBC with Differential/Platelet, Comprehensive metabolic panel  Thrombocytopenia ([name])  Controlled type 2 diabetes mellitus without complication, without long-term current use of insulin ([name])  Vitamin D deficiency - Plan: VITAMIN D 25 Hydroxy (Vit-D Deficiency, Fractures)  AKI (acute kidney injury) ([place])  Anemia of chronic disease I will continue him on a multivitamin as well as extra vitamin B12. Some routine blood screening on him and have him return here in approximately one month. He is comfortable with this. Further evaluation and treatment will depend upon how his blood  work comes back now and in 1 month.

## 2015-05-10 LAB — VITAMIN D 25 HYDROXY (VIT D DEFICIENCY, FRACTURES): Vit D, 25-Hydroxy: 23 ng/mL — ABNORMAL LOW (ref 30–100)

## 2015-05-11 ENCOUNTER — Other Ambulatory Visit: Payer: Self-pay | Admitting: Cardiovascular Disease

## 2015-05-11 ENCOUNTER — Other Ambulatory Visit: Payer: Self-pay | Admitting: Family Medicine

## 2015-05-12 ENCOUNTER — Telehealth: Payer: Self-pay

## 2015-05-12 NOTE — Telephone Encounter (Signed)
Pt called today and said that Paul [name] Dr. Set him up with appointments at the cancer center Dr.Peter Enever  Patient doesnt know why but wants to make sure appointments are okay with you and he will go please let me know what to tell him

## 2015-05-12 NOTE — Telephone Encounter (Signed)
Pt was informed to cancel appt.

## 2015-05-12 NOTE — Telephone Encounter (Signed)
Tell him to hold off on the appointment as I am treating him for the same problem and he might not need this at the hematologist

## 2015-05-14 NOTE — Telephone Encounter (Signed)
05/14/2015 °

## 2015-05-28 ENCOUNTER — Other Ambulatory Visit: Payer: Medicare Other

## 2015-05-28 ENCOUNTER — Ambulatory Visit: Payer: Medicare Other

## 2015-05-28 ENCOUNTER — Ambulatory Visit: Payer: Medicare Other | Admitting: Hematology & Oncology

## 2015-06-11 ENCOUNTER — Encounter: Payer: Self-pay | Admitting: Family Medicine

## 2015-06-11 ENCOUNTER — Ambulatory Visit (INDEPENDENT_AMBULATORY_CARE_PROVIDER_SITE_OTHER): Payer: Medicare Other | Admitting: Family Medicine

## 2015-06-11 VITALS — BP 80/40 | HR 60 | Ht 70.0 in | Wt 212.0 lb

## 2015-06-11 DIAGNOSIS — D638 Anemia in other chronic diseases classified elsewhere: Secondary | ICD-10-CM

## 2015-06-11 DIAGNOSIS — M109 Gout, unspecified: Secondary | ICD-10-CM

## 2015-06-11 DIAGNOSIS — I959 Hypotension, unspecified: Secondary | ICD-10-CM | POA: Diagnosis not present

## 2015-06-11 DIAGNOSIS — M10041 Idiopathic gout, right hand: Secondary | ICD-10-CM

## 2015-06-11 DIAGNOSIS — N179 Acute kidney failure, unspecified: Secondary | ICD-10-CM | POA: Diagnosis not present

## 2015-06-11 DIAGNOSIS — R531 Weakness: Secondary | ICD-10-CM

## 2015-06-11 DIAGNOSIS — M1000 Idiopathic gout, unspecified site: Secondary | ICD-10-CM | POA: Diagnosis not present

## 2015-06-11 LAB — CBC WITH DIFFERENTIAL/PLATELET
Basophils Absolute: 0 cells/uL (ref 0–200)
Basophils Relative: 0 %
Eosinophils Absolute: 134 cells/uL (ref 15–500)
Eosinophils Relative: 2 %
HCT: 28.3 % — ABNORMAL LOW (ref 38.5–50.0)
Hemoglobin: 9.3 g/dL — ABNORMAL LOW (ref 13.2–17.1)
Lymphocytes Relative: 40 %
Lymphs Abs: 2680 cells/uL (ref 850–3900)
MCH: 30.8 pg (ref 27.0–33.0)
MCHC: 32.9 g/dL (ref 32.0–36.0)
MCV: 93.7 fL (ref 80.0–100.0)
MPV: 10.2 fL (ref 7.5–12.5)
Monocytes Absolute: 402 cells/uL (ref 200–950)
Monocytes Relative: 6 %
Neutro Abs: 3484 cells/uL (ref 1500–7800)
Neutrophils Relative %: 52 %
Platelets: 221 10*3/uL (ref 140–400)
RBC: 3.02 MIL/uL — ABNORMAL LOW (ref 4.20–5.80)
RDW: 17.1 % — ABNORMAL HIGH (ref 11.0–15.0)
WBC: 6.7 10*3/uL (ref 4.0–10.5)

## 2015-06-11 LAB — COMPREHENSIVE METABOLIC PANEL
ALT: 21 U/L (ref 9–46)
AST: 28 U/L (ref 10–35)
Albumin: 3.1 g/dL — ABNORMAL LOW (ref 3.6–5.1)
Alkaline Phosphatase: 61 U/L (ref 40–115)
BUN: 18 mg/dL (ref 7–25)
CO2: 25 mmol/L (ref 20–31)
Calcium: 8.4 mg/dL — ABNORMAL LOW (ref 8.6–10.3)
Chloride: 102 mmol/L (ref 98–110)
Creat: 0.94 mg/dL (ref 0.70–1.18)
Glucose, Bld: 144 mg/dL — ABNORMAL HIGH (ref 65–99)
Potassium: 4.3 mmol/L (ref 3.5–5.3)
Sodium: 135 mmol/L (ref 135–146)
Total Bilirubin: 0.8 mg/dL (ref 0.2–1.2)
Total Protein: 6.2 g/dL (ref 6.1–8.1)

## 2015-06-11 LAB — URIC ACID: Uric Acid, Serum: 9.3 mg/dL — ABNORMAL HIGH (ref 4.0–7.8)

## 2015-06-11 NOTE — Patient Instructions (Signed)
Don't take the quinapril for right now

## 2015-06-11 NOTE — Progress Notes (Signed)
   Subjective:    Patient ID: Paul [name], male    DOB: [birth year], 75 y.o.   MRN: [id number]  HPI  he is here for recheck. He continues to complain of weakness, cold intolerance, sleeping a lot but no chest pain, shortness of breath, diarrhea, nausea, vomiting, black  Stools. He states that his gout has been flaring with left knee, right elbow and back pain. He has a previous history of trouble with his back. He is taking colchicine for his gout symptoms and states it helps. He states his last uric acid when he was asymptomatic was in the 5 range. He has had recent blood work which did show normal TSH. He was recently in the hospital and did have multiple blood abnormalities including kidney as well as total protein , vitamin B12   Review of Systems     Objective:   Physical Exam Alert and in no distress. Tympanic membranes and canals are normal. Pharyngeal area is normal. Neck is supple without adenopathy or thyromegaly. Cardiac exam shows a regular sinus rhythm without murmurs or gallops. Lungs are clear to auscultation. No swelling, redness or warmth of his left knee or right elbow.       Assessment & Plan:  Gout of right hand, unspecified cause, unspecified chronicity - Plan: Uric Acid  AKI (acute kidney injury) ([name]) - Plan: CBC with Differential/Platelet, Comprehensive metabolic panel  Weakness - Plan: CBC with Differential/Platelet, Comprehensive metabolic panel  Anemia of chronic disease - Plan: CBC with Differential/Platelet  he will continue on colchicine on a daily basis at his request. I will have him hold quinapril and await blood results.  the etiology behind the low blood pressure is questionable at this point.

## 2015-06-20 ENCOUNTER — Other Ambulatory Visit: Payer: Self-pay | Admitting: Family Medicine

## 2015-06-25 ENCOUNTER — Other Ambulatory Visit: Payer: Medicare Other

## 2015-06-25 DIAGNOSIS — D649 Anemia, unspecified: Secondary | ICD-10-CM

## 2015-06-25 LAB — CBC WITH DIFFERENTIAL/PLATELET
Basophils Absolute: 0 cells/uL (ref 0–200)
Basophils Relative: 0 %
Eosinophils Absolute: 77 cells/uL (ref 15–500)
Eosinophils Relative: 1 %
HCT: 28.3 % — ABNORMAL LOW (ref 38.5–50.0)
Hemoglobin: 9.4 g/dL — ABNORMAL LOW (ref 13.2–17.1)
Lymphocytes Relative: 36 %
Lymphs Abs: 2772 cells/uL (ref 850–3900)
MCH: 31.2 pg (ref 27.0–33.0)
MCHC: 33.2 g/dL (ref 32.0–36.0)
MCV: 94 fL (ref 80.0–100.0)
MPV: 10.1 fL (ref 7.5–12.5)
Monocytes Absolute: 539 cells/uL (ref 200–950)
Monocytes Relative: 7 %
Neutro Abs: 4312 cells/uL (ref 1500–7800)
Neutrophils Relative %: 56 %
Platelets: 196 10*3/uL (ref 140–400)
RBC: 3.01 MIL/uL — ABNORMAL LOW (ref 4.20–5.80)
RDW: 17.2 % — ABNORMAL HIGH (ref 11.0–15.0)
WBC: 7.7 10*3/uL (ref 4.0–10.5)

## 2015-07-02 ENCOUNTER — Ambulatory Visit: Payer: Medicare Other | Admitting: Family Medicine

## 2015-07-06 ENCOUNTER — Other Ambulatory Visit: Payer: Self-pay | Admitting: Family Medicine

## 2015-07-22 ENCOUNTER — Other Ambulatory Visit: Payer: Self-pay | Admitting: Family Medicine

## 2015-07-22 ENCOUNTER — Other Ambulatory Visit: Payer: Medicare Other

## 2015-07-22 DIAGNOSIS — R7989 Other specified abnormal findings of blood chemistry: Secondary | ICD-10-CM

## 2015-07-22 DIAGNOSIS — D510 Vitamin B12 deficiency anemia due to intrinsic factor deficiency: Secondary | ICD-10-CM | POA: Diagnosis not present

## 2015-07-22 DIAGNOSIS — R799 Abnormal finding of blood chemistry, unspecified: Secondary | ICD-10-CM | POA: Diagnosis not present

## 2015-07-22 LAB — CBC WITH DIFFERENTIAL/PLATELET
Basophils Absolute: 0 cells/uL (ref 0–200)
Basophils Relative: 0 %
Eosinophils Absolute: 0 cells/uL — ABNORMAL LOW (ref 15–500)
Eosinophils Relative: 0 %
HCT: 30.4 % — ABNORMAL LOW (ref 38.5–50.0)
Hemoglobin: 9.8 g/dL — ABNORMAL LOW (ref 13.2–17.1)
Lymphocytes Relative: 45 %
Lymphs Abs: 3960 cells/uL — ABNORMAL HIGH (ref 850–3900)
MCH: 30.8 pg (ref 27.0–33.0)
MCHC: 32.2 g/dL (ref 32.0–36.0)
MCV: 95.6 fL (ref 80.0–100.0)
MPV: 11.1 fL (ref 7.5–12.5)
Monocytes Absolute: 616 cells/uL (ref 200–950)
Monocytes Relative: 7 %
Neutro Abs: 4224 cells/uL (ref 1500–7800)
Neutrophils Relative %: 48 %
Platelets: 200 10*3/uL (ref 140–400)
RBC: 3.18 MIL/uL — ABNORMAL LOW (ref 4.20–5.80)
RDW: 18.3 % — ABNORMAL HIGH (ref 11.0–15.0)
WBC: 8.8 10*3/uL (ref 4.0–10.5)

## 2015-07-22 LAB — URIC ACID: Uric Acid, Serum: 7.9 mg/dL (ref 4.0–8.0)

## 2015-07-25 LAB — VITAMIN B12: Vitamin B-12: 257 pg/mL (ref 200–1100)

## 2015-08-07 ENCOUNTER — Ambulatory Visit (HOSPITAL_COMMUNITY)
Admission: RE | Admit: 2015-08-07 | Discharge: 2015-08-07 | Disposition: A | Discharge status: Home / Self Care | Payer: Medicare Other | Source: Ambulatory Visit | Attending: Cardiovascular Disease | Admitting: Cardiovascular Disease

## 2015-08-07 DIAGNOSIS — E785 Hyperlipidemia, unspecified: Secondary | ICD-10-CM | POA: Diagnosis not present

## 2015-08-07 DIAGNOSIS — E119 Type 2 diabetes mellitus without complications: Secondary | ICD-10-CM | POA: Insufficient documentation

## 2015-08-07 DIAGNOSIS — I6501 Occlusion and stenosis of right vertebral artery: Secondary | ICD-10-CM | POA: Diagnosis not present

## 2015-08-07 DIAGNOSIS — I2510 Atherosclerotic heart disease of native coronary artery without angina pectoris: Secondary | ICD-10-CM | POA: Insufficient documentation

## 2015-08-07 DIAGNOSIS — I6523 Occlusion and stenosis of bilateral carotid arteries: Secondary | ICD-10-CM | POA: Insufficient documentation

## 2015-08-07 DIAGNOSIS — I10 Essential (primary) hypertension: Secondary | ICD-10-CM | POA: Insufficient documentation

## 2015-08-07 DIAGNOSIS — I779 Disorder of arteries and arterioles, unspecified: Secondary | ICD-10-CM

## 2015-08-07 DIAGNOSIS — I739 Peripheral vascular disease, unspecified: Secondary | ICD-10-CM

## 2015-08-10 ENCOUNTER — Other Ambulatory Visit: Payer: Self-pay | Admitting: Family Medicine

## 2015-08-10 ENCOUNTER — Other Ambulatory Visit: Payer: Self-pay | Admitting: Cardiovascular Disease

## 2015-08-28 DIAGNOSIS — H2513 Age-related nuclear cataract, bilateral: Secondary | ICD-10-CM | POA: Diagnosis not present

## 2015-08-28 DIAGNOSIS — E119 Type 2 diabetes mellitus without complications: Secondary | ICD-10-CM | POA: Diagnosis not present

## 2015-08-28 DIAGNOSIS — H04123 Dry eye syndrome of bilateral lacrimal glands: Secondary | ICD-10-CM | POA: Diagnosis not present

## 2015-08-28 LAB — HM DIABETES EYE EXAM

## 2015-09-01 ENCOUNTER — Encounter: Payer: Self-pay | Admitting: Family Medicine

## 2015-09-03 ENCOUNTER — Encounter: Payer: Self-pay | Admitting: Family Medicine

## 2015-09-08 ENCOUNTER — Encounter: Payer: Self-pay | Admitting: Family Medicine

## 2015-09-08 ENCOUNTER — Ambulatory Visit (INDEPENDENT_AMBULATORY_CARE_PROVIDER_SITE_OTHER): Payer: Medicare Other | Admitting: Family Medicine

## 2015-09-08 ENCOUNTER — Other Ambulatory Visit: Payer: Self-pay | Admitting: Family Medicine

## 2015-09-08 VITALS — BP 132/78 | HR 64 | Temp 98.1°F | Wt 208.2 lb

## 2015-09-08 DIAGNOSIS — M25571 Pain in right ankle and joints of right foot: Secondary | ICD-10-CM

## 2015-09-08 DIAGNOSIS — M25572 Pain in left ankle and joints of left foot: Secondary | ICD-10-CM

## 2015-09-08 MED ORDER — DICLOFENAC SODIUM 75 MG PO TBEC: 75.0000 mg | DELAYED_RELEASE_TABLET | Freq: Two times a day (BID) | ORAL | 0 refills | Status: DC

## 2015-09-08 NOTE — Progress Notes (Signed)
   Subjective:    Patient ID: Paul [name], male    DOB: 09-17-1940, 75 y.o.   MRN: [id number]  HPI Chief Complaint  Patient presents with  . Other    both ankles hurting. stiff and can't move them   He is here with complaints of bilateral ankle pain that has been worse for past 4 days but mild for several months and mostly with his left ankle. States he has been in the bed for the past 2 days due to worsening pain. Reports after walking for exercise 4 days ago the pain worsened. States it does not feel like gout and he has been taking daily colchicine. Pain is worse with weightbearing and movement and is somewhat relieved with rest. Pain is also improved with tramadol.  No other joint involvement.  Denies fever, chills, fatigue, numbness, tingling, weakness.    Past Medical History:  Diagnosis Date  . AAA (abdominal aortic aneurysm) ([place])   . Abnormal LFTs   . AKI (acute kidney injury) ([place])   . Anemia of chronic disease   . Atherosclerosis of native coronary artery of native heart without angina pectoris   . CAD (coronary artery disease)    with cath 09/26/08 showing severe heart disease in RCA with POBA, mild to mod diesease in circ and LAD  . Chronic back pain    back problems prior to surgery   . Constipation   . Coronary atherosclerosis of native coronary artery   . Dehydration   . Diabetes mellitus    with neuropathy  . Diarrhea   . Elevated liver enzymes   . Generalized weakness   . Gout of right hand   . Hiatal hernia   . Hyperlipidemia   . Hypertension   . Metabolic acidosis   . Thrombocytopenia ([place])   . Vitamin B12 deficiency   . Vitamin B12 deficiency   . Vitamin D deficiency   . Weakness       Review of Systems Pertinent positives and negatives in the history of present illness.     Objective:   Physical Exam BP 132/78   Pulse 64   Temp 98.1 F (36.7 C) (Oral)   Wt 208 lb 3.2 oz (94.4 kg)   BMI 29.87 kg/m   Bilateral lower extremities-  knee exams normal. Bilateral ankles and feet  with mild diffuse tenderness over ankles and pain with dorsiflexion, left greater than right. No erythema, warmth, edema. Normal capillary refill, sensation and pulses. Range of motion is somewhat limited to the left ankle versus right ankle.       Assessment & Plan:  Bilateral ankle pain - Plan: diclofenac (VOLTAREN) 75 MG EC tablet  Discussed patient with Dr. Redmond School and suspect that his pain is more likely associated with arthritis vs gout.  Recommend a two-week course of diclofenac to see if this improves symptoms and he will follow-up. Recommended that he take this medication with food to avoid GI upset.

## 2015-09-08 NOTE — Addendum Note (Signed)
Addended by: Minette Headland A on: 09/08/2015 08:58 AM   Modules accepted: Orders

## 2015-09-21 ENCOUNTER — Other Ambulatory Visit: Payer: Self-pay | Admitting: Family Medicine

## 2015-09-21 DIAGNOSIS — M25572 Pain in left ankle and joints of left foot: Principal | ICD-10-CM

## 2015-09-21 DIAGNOSIS — M25571 Pain in right ankle and joints of right foot: Secondary | ICD-10-CM

## 2015-09-22 ENCOUNTER — Other Ambulatory Visit: Payer: Self-pay | Admitting: Family Medicine

## 2015-09-22 ENCOUNTER — Ambulatory Visit (INDEPENDENT_AMBULATORY_CARE_PROVIDER_SITE_OTHER): Payer: Medicare Other | Admitting: Family Medicine

## 2015-09-22 VITALS — BP 130/60 | HR 55

## 2015-09-22 DIAGNOSIS — M25572 Pain in left ankle and joints of left foot: Principal | ICD-10-CM

## 2015-09-22 DIAGNOSIS — M109 Gout, unspecified: Secondary | ICD-10-CM

## 2015-09-22 DIAGNOSIS — M25571 Pain in right ankle and joints of right foot: Secondary | ICD-10-CM | POA: Diagnosis not present

## 2015-09-22 DIAGNOSIS — M10079 Idiopathic gout, unspecified ankle and foot: Secondary | ICD-10-CM | POA: Diagnosis not present

## 2015-09-22 MED ORDER — DICLOFENAC SODIUM 75 MG PO TBEC: 75.0000 mg | DELAYED_RELEASE_TABLET | Freq: Two times a day (BID) | ORAL | 1 refills | Status: DC

## 2015-09-22 MED ORDER — COLCHICINE 0.6 MG PO TABS: 0.6000 mg | ORAL_TABLET | Freq: Every day | ORAL | 1 refills | Status: DC

## 2015-09-22 MED ORDER — ALLOPURINOL 100 MG PO TABS: 150.0000 mg | ORAL_TABLET | Freq: Every day | ORAL | 0 refills | Status: DC

## 2015-09-22 NOTE — Patient Instructions (Signed)
Call me when you're about to run out of the allopurinol and I will call in the higher strength

## 2015-09-22 NOTE — Progress Notes (Signed)
   Subjective:    Patient ID: Paul [name], male    DOB: 1940-09-06, 75 y.o.   MRN: [id number]  HPI He is here for recheck. He does state that the foot pain has gotten much better. He has been on culture seen for quite some time. He complains of allopurinol in the past because gout attacks.   Review of Systems     Objective:   Physical Exam Alert and in no distress. Most recent uric acid level is 7.9       Assessment & Plan:  Bilateral ankle pain - Plan: colchicine 0.6 MG tablet, diclofenac (VOLTAREN) 75 MG EC tablet, DISCONTINUED: allopurinol (ZYLOPRIM) 100 MG tablet  Gout of foot, unspecified cause, unspecified chronicity, unspecified laterality - Plan: colchicine 0.6 MG tablet, diclofenac (VOLTAREN) 75 MG EC tablet, DISCONTINUED: allopurinol (ZYLOPRIM) 100 MG tablet I explained that allopurinol early on can cause difficulty with gout. He will continue on culture seen and have back up with the diclofenac. I will start him on 150 mg of allopurinol and increases in one month to 300 mg. We will then check his uric acid level in roughly 2-3 months. He is comfortable with that.

## 2015-09-23 NOTE — Telephone Encounter (Signed)
Is this okay to refill? 

## 2015-10-11 ENCOUNTER — Other Ambulatory Visit: Payer: Self-pay | Admitting: Family Medicine

## 2015-11-09 ENCOUNTER — Other Ambulatory Visit: Payer: Self-pay | Admitting: Family Medicine

## 2015-11-17 ENCOUNTER — Encounter: Payer: Self-pay | Admitting: Family Medicine

## 2015-11-17 ENCOUNTER — Ambulatory Visit (INDEPENDENT_AMBULATORY_CARE_PROVIDER_SITE_OTHER): Payer: Medicare Other | Admitting: Family Medicine

## 2015-11-17 VITALS — BP 120/50 | HR 68 | Ht 70.0 in | Wt 218.0 lb

## 2015-11-17 DIAGNOSIS — I2510 Atherosclerotic heart disease of native coronary artery without angina pectoris: Secondary | ICD-10-CM

## 2015-11-17 DIAGNOSIS — R8290 Unspecified abnormal findings in urine: Secondary | ICD-10-CM | POA: Diagnosis not present

## 2015-11-17 DIAGNOSIS — E119 Type 2 diabetes mellitus without complications: Secondary | ICD-10-CM

## 2015-11-17 DIAGNOSIS — D638 Anemia in other chronic diseases classified elsewhere: Secondary | ICD-10-CM | POA: Diagnosis not present

## 2015-11-17 DIAGNOSIS — M109 Gout, unspecified: Secondary | ICD-10-CM

## 2015-11-17 DIAGNOSIS — N390 Urinary tract infection, site not specified: Secondary | ICD-10-CM | POA: Diagnosis not present

## 2015-11-17 DIAGNOSIS — Z23 Encounter for immunization: Secondary | ICD-10-CM | POA: Diagnosis not present

## 2015-11-17 LAB — POCT URINALYSIS DIPSTICK
Bilirubin, UA: NEGATIVE
Blood, UA: POSITIVE
Glucose, UA: NEGATIVE
Ketones, UA: NEGATIVE
Nitrite, UA: POSITIVE
Protein, UA: POSITIVE
Spec Grav, UA: 1.025
Urobilinogen, UA: NEGATIVE
pH, UA: 5.5

## 2015-11-17 LAB — POCT GLYCOSYLATED HEMOGLOBIN (HGB A1C): Hemoglobin A1C: 6.1

## 2015-11-17 MED ORDER — ALLOPURINOL 300 MG PO TABS: 300.0000 mg | ORAL_TABLET | Freq: Every day | ORAL | 6 refills | Status: DC

## 2015-11-17 NOTE — Patient Instructions (Addendum)
Take the 300 mg of allopurinol and I will call in. Stay on the colchicine daily. Use the Voltaren as needed for pain

## 2015-11-17 NOTE — Progress Notes (Addendum)
Subjective:   HPI  Paul [name] is a 75 y.o. male who presents for a complete physical.  Medical care team includes:  Dr.Chris  Mackelhany     Preventative care: Last ophthalmology visit:08/28/15 Last dental visit:? Last colonoscopy:10/02/09 Last prostate exam: ? Last EKG:04/20/15 Last labs:07/22/15  Prior vaccinations: TD or Tdap:08/24/10 Influenza:11/17/15 Pneumococcal: 23: 09/06/11 13:09/27/13 Shingles/Zostavax:02/02/12  Advanced directive:Yes Health care power of attorney:yes. Asked him to bring in a copy so I can put it in the record.  Concerns he presently is on 150 mg of allopurinol as well as culture seen and does occasionally take Voltaren for pain relief. He does not complain of much ankle or foot discomfort. He is checking his blood sugars and notes that they have improved. He checks his feet regularly. His exercise is minimal. He does not smoke and drinks socially. He has not had any chest pain, shortness of breath, PND. He has not taken the nitroglycerin in several years. He continues on his blood pressure medications and having no difficulty. He continues on simvastatin and no muscle aches or pains.  Reviewed their medical, surgical, family, social, medication, and allergy history and updated chart as appropriate.    Review of Systems Constitutional: -fever, -chills, -sweats, -unexpected weight change, -decreased appetite, -fatigue Allergy: -sneezing, -itching, -congestion Dermatology: -changing moles, --rash, -lumps  Cardiology: -chest pain, -palpitations, -swelling, -difficulty breathing when lying flat, -waking up short of breath Respiratory: -cough, -shortness of breath, -difficulty breathing with exercise or exertion, -wheezing, -coughing up blood Gastroenterology: -abdominal pain, -nausea, -vomiting, -diarrhea, -constipation, -blood in stool, -changes in bowel movement, -difficulty swallowing or eating Hematology: -bleeding, -bruising  Musculoskeletal:  -joint aches, -muscle aches, -joint swelling, -back pain, -neck pain, -cramping, -changes in gait Ophthalmology: denies vision changes, eye redness, itching, discharge Urology: -burning with urination, -difficulty urinating, -blood in urine, -urinary frequency, -urgency, -incontinence Neurology: -headache, -weakness, -tingling, -numbness, -memory loss, -falls, -dizziness Psychology: -depressed mood, -agitation, -sleep problems     Objective:   Physical Exam General appearance: alert, no distress, WD/WN,  Skin:Normal HEENT: normocephalic, conjunctiva/corneas normal, sclerae anicteric, PERRLA, EOMi, nares patent, no discharge or erythema, pharynx normal Oral cavity: MMM, tongue normal, teeth normal Neck: supple, no lymphadenopathy, no thyromegaly, no masses, normal ROM Chest: non tender, normal shape and expansion Heart: RRR, normal S1, S2, no murmurs Lungs: CTA bilaterally, no wheezes, rhonchi, or rales Abdomen: +bs, soft, non tender, non distended, no masses, no hepatomegaly, no splenomegaly, no bruits Back: non tender, normal ROM, no scoliosis Musculoskeletal: upper extremities non tender, no obvious deformity, normal ROM throughout, lower extremities non tender, no obvious deformity, normal ROM throughout Extremities: no edema, no cyanosis, no clubbing Pulses: 2+ symmetric, upper and lower extremities, normal cap refill Neurological: alert, oriented x 3, CN2-12 intact, strength normal upper extremities and lower extremities, sensation normal throughout, DTRs 2+ throughout, no cerebellar signs, gait normal Psychiatric: normal affect, behavior normal, pleasant  Urine microscopic was positive for red cells, white cells and bacteria. Review of blood work shows an anemia however platelets are normal. Assessment and Plan :   Gout of foot, unspecified cause, unspecified chronicity, unspecified laterality - Plan: allopurinol (ZYLOPRIM) 300 MG tablet  Controlled type 2 diabetes mellitus without  complication, without long-term current use of insulin ([place]) - Plan: HgB A1c, POCT Urinalysis Dipstick  Abnormal urinalysis - Plan: CULTURE, URINE COMPREHENSIVE, POCT Urinalysis Dipstick  Need for prophylactic vaccination and inoculation against influenza - Plan: Flu vaccine HIGH DOSE PF (Fluzone High dose)  Atherosclerosis of native coronary artery  of native heart without angina pectoris  Anemia of chronic disease He is having no urinary symptoms however I will do a urine culture and follow-up pending results of this. He will continue on his other medications. Physical exam - discussed healthy lifestyle, diet, exercise, preventative care, vaccinations, and addressed their concerns.    Follow-up 4 months. The urine culture did grow Escherichia coli sensitive to many organisms. I will call in Levaquin and have him return here in 2 weeks for follow-up. His case was discussed with Dr. Jeffie Pollock who recommended this and if continued difficulty urology referral will be made.

## 2015-11-20 LAB — CULTURE, URINE COMPREHENSIVE

## 2015-11-20 MED ORDER — LEVOFLOXACIN 500 MG PO TABS: 500.0000 mg | ORAL_TABLET | Freq: Every day | ORAL | 0 refills | Status: DC

## 2015-11-20 NOTE — Addendum Note (Signed)
Addended by: Denita Lung on: 11/20/2015 04:47 PM   Modules accepted: Orders

## 2015-11-23 NOTE — Progress Notes (Deleted)
No chief complaint on file.    History of Present Illness: 75 yo WM with history of CAD with stent RCA, DM, bilateral carotid artery disease, HTN, HLD here today for cardiology follow up. His echo in 2009 showed mild LVH and normal LV function with no significant valvular abnormalities. Cardiac cath in 2009 showed a near total occlusion of the RCA beyond the stent in the mid portion. I attempted PCI and was able to balloon the mid lesion but could never pass a balloon distally. He did well after that.  I saw him December 2013 and he c/o chest tightness with minimal exertion associated with SOB. Cardiac cath 01/21/12 with total occlusion of the RCA which was not amenable to PCI, moderate non-obstructive disease in the LAD and Circumflex. He has been medically managed since then. Normal ABI 10/01/13. Carotid artery dopplers July 2017 with stable bilateral 40-59% stenosis.   He is here today for planned follow up. Denies chest pain or SOB. ***He continues to smoke. Overall feeling well.   Primary Care Physician: Wyatt Haste, MD   Past Medical History:  Diagnosis Date  . AAA (abdominal aortic aneurysm) ([place])   . Abnormal LFTs   . AKI (acute kidney injury) ([place])   . Anemia of chronic disease   . Atherosclerosis of native coronary artery of native heart without angina pectoris   . CAD (coronary artery disease)    with cath 09/26/08 showing severe heart disease in RCA with POBA, mild to mod diesease in circ and LAD  . Chronic back pain    back problems prior to surgery   . Constipation   . Coronary atherosclerosis of native coronary artery   . Dehydration   . Diabetes mellitus    with neuropathy  . Diarrhea   . Elevated liver enzymes   . Generalized weakness   . Gout of right hand   . Hiatal hernia   . Hyperlipidemia   . Hypertension   . Metabolic acidosis   . Thrombocytopenia ([place])   . Vitamin B12 deficiency   . Vitamin B12 deficiency   . Vitamin D deficiency   . Weakness       Past Surgical History:  Procedure Laterality Date  . BACK SURGERY     x3  . CARPAL TUNNEL RELEASE     L arm   . left ankle surgery    . LEFT HEART CATHETERIZATION WITH CORONARY ANGIOGRAM N/A 01/21/2012   Procedure: LEFT HEART CATHETERIZATION WITH CORONARY ANGIOGRAM;  Surgeon: Burnell Blanks, MD;  Location[facility] C[facility] LAB;  Service: Cardiovascular;  Laterality: N/A;  . 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 ACCU-CHEK AVIVA PLUS test strip Test once to twice daily 200 each 4  . allopurinol (ZYLOPRIM) 300 MG tablet Take 1 tablet (300 mg total) by mouth daily. 30 tablet 6  . aspirin 81 MG chewable tablet Chew 81 mg by mouth daily.    . clopidogrel (PLAVIX) 75 MG tablet TAKE 1 TABLET BY MOUTH  DAILY 90 tablet 0  . colchicine 0.6 MG tablet Take 1 tablet (0.6 mg total) by mouth daily. 90 tablet 1  . diclofenac (VOLTAREN) 75 MG EC tablet TAKE 1 TABLET(75 MG) BY MOUTH TWICE DAILY 180 tablet 1  . fish oil-omega-3 fatty acids 1000 MG capsule Take 2 g by mouth daily.     . isosorbide mononitrate (IMDUR) 30 MG 24 hr tablet Take  1 tablet by mouth  twice a day 180 tablet 1  . levofloxacin (LEVAQUIN) 500 MG tablet Take 1 tablet (500 mg total) by mouth daily. 7 tablet 0  . metFORMIN (GLUCOPHAGE-XR) 500 MG 24 hr tablet Take 2 tablets by mouth  daily 180 tablet 1  . metoprolol tartrate (LOPRESSOR) 25 MG tablet Take 1 tablet by mouth two  times daily 180 tablet 1  . Multiple Vitamins-Minerals (CENTRUM) tablet Take 1 tablet by mouth daily.    . nitroGLYCERIN (NITROSTAT) 0.4 MG SL tablet Place 0.4 mg under the tongue every 5 (five) minutes as needed for chest pain. For 3 doses    . quinapril (ACCUPRIL) 20 MG tablet Take 1 tablet by mouth  twice a day 180 tablet 3  . simvastatin (ZOCOR) 80 MG tablet Take 1 tablet by mouth at  bedtime 90 tablet 1  . traMADol (ULTRAM) 50 MG tablet Take by mouth every 4 (four) hours as needed. Reported  on 06/11/2015    . vitamin B-12 (CYANOCOBALAMIN) 1000 MCG tablet Take 1 tablet (1,000 mcg total) by mouth daily. 90 tablet 3   No current facility-administered medications for this visit.     No Known Allergies  Social History   Social History  . Marital status: Married    Spouse name: N/A  . Number of children: N/A  . Years of education: N/A   Occupational History  . Not on file.   Social History Main Topics  . Smoking status: Former Smoker    Types: Cigars  . Smokeless tobacco: Never Used     Comment: quit smoking cigars right before christmas 2014!  Marland Kitchen Alcohol use No  . Drug use: No  . Sexual activity: Yes   Other Topics Concern  . Not on file   Social History Narrative   Divorced, 1 child.    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    Owned roofing co. In [place]    Family History  Problem Relation Age of Onset  . Heart attack Mother   . Cancer Mother   . Heart attack Sister     Review of Systems:  As stated in the HPI and otherwise negative.   There were no vitals taken for this visit.  Physical Examination: General: Well developed, well nourished, NAD  HEENT: OP clear, mucus membranes moist  SKIN: warm, dry. No rashes. Neuro: No focal deficits  Musculoskeletal: Muscle strength 5/5 all ext  Psychiatric: Mood and affect normal  Neck: No JVD, no carotid bruits, no thyromegaly, no lymphadenopathy.  Lungs:Clear bilaterally, no wheezes, rhonci, crackles Cardiovascular: Regular rate and rhythm. No murmurs, gallops or rubs. Abdomen:Soft. Bowel sounds present. Non-tender.  Extremities: Mild bilateral lower extremity edema. Pulses are 2 + in the bilateral DP/PT.  Carotid artery disease: July 2017: Bilateral 40-59% stenosis.   EKG:  EKG is *** ordered today. The ekg ordered today demonstrates   Recent Labs: 04/20/2015: Magnesium 1.4 04/21/2015: TSH 0.438 06/11/2015: ALT 21; BUN 18; Creat 0.94; Potassium 4.3; Sodium 135 07/22/2015:  Hemoglobin 9.8; Platelets 200   Lipid Panel    Component Value Date/Time   CHOL 107 (L) 03/03/2015 0001   TRIG 133 03/03/2015 0001   HDL 30 (L) 03/03/2015 0001   CHOLHDL 3.6 03/03/2015 0001   VLDL 27 03/03/2015 0001   LDLCALC 50 03/03/2015 0001     Wt Readings from Last 3 Encounters:  11/17/15 218 lb (98.9 kg)  09/08/15 208 lb 3.2 oz (94.4 kg)  06/11/15  212 lb (96.2 kg)     Other studies Reviewed: Additional studies/ records that were reviewed today include: . Review of the above records demonstrates:    Assessment and Plan:   1. CAD: Stable angina. His RCA is occluded distally, not amenable to PCI. The LAD and Circumflex have moderate disease. Continue current meds.  2. HTN: BP well controlled. No changes today.   3. Carotid artery disease: He has moderate bilateral disease which is stable by dopplers July 2017. Repeat July 2018.   4. Hyperlipidemia: Lipids well controlled. Continue statin.   Current medicines are reviewed at length with the patient today.  The patient does not have concerns regarding medicines.  The following changes have been made:  no change  Labs/ tests ordered today include:   No orders of the defined types were placed in this encounter.   Disposition:   FU with me in 6 months  Signed, Lauree Chandler, MD 11/23/2015 9:39 AM    [facility] [address], [place], [place]  [address] Phone: [phone]; Fax: [phone]

## 2015-11-24 ENCOUNTER — Ambulatory Visit: Payer: Medicare Other | Admitting: Cardiovascular Disease

## 2015-11-25 ENCOUNTER — Ambulatory Visit (INDEPENDENT_AMBULATORY_CARE_PROVIDER_SITE_OTHER): Payer: Medicare Other | Admitting: Family Medicine

## 2015-11-25 ENCOUNTER — Encounter: Payer: Self-pay | Admitting: Family Medicine

## 2015-11-25 VITALS — BP 80/40 | HR 68 | Wt 220.0 lb

## 2015-11-25 DIAGNOSIS — M109 Gout, unspecified: Secondary | ICD-10-CM

## 2015-11-25 MED ORDER — ALLOPURINOL 100 MG PO TABS: ORAL_TABLET | ORAL | 3 refills | Status: DC

## 2015-11-25 NOTE — Progress Notes (Signed)
   Subjective:    Patient ID: Paul [name], male    DOB: 20-Dec-1940, 75 y.o.   MRN: [id number]  HPI He is here for evaluation of swelling and redness that he has noted in his wrists, knees, ankles and feet. This occurred when he went from 150 mg of allopurinol up to 300 mg. He has continued on colchicine and on Saturday he states he took as many as 8 which did cause some difficulty with diarrhea. He continues on Voltaren 75 mg twice a day. Today he is doing much better having difficulty mainly with swelling.   Review of Systems     Objective:   Physical Exam Alert and in no distress. No erythema or warmth is noted but he does have swelling in his lower extremities but none in his wrists, ankles or knees.       Assessment & Plan:  Gout of foot, unspecified cause, unspecified chronicity, unspecified laterality - Plan: allopurinol (ZYLOPRIM) 100 MG tablet I think increasing the dose caused a gout attack and explained this to him. Recommend he cut back to 200 mg, maintain culture seen at one pill twice per day and Voltaren. Did recommend once things has quieted down for him to reduce his culture seen to 1 per day and use of Voltaren as needed. He will follow-up with me at the end of December.

## 2015-11-25 NOTE — Patient Instructions (Signed)
Take to to allopurinol per day. Stay on the culture seen twice per day and diclofenac however when the pain stops you can stop the diclofenac cut back to the culture seem to once per day. Make sure you drink plenty of fluids

## 2015-12-01 ENCOUNTER — Other Ambulatory Visit: Payer: Self-pay

## 2015-12-04 ENCOUNTER — Emergency Department (HOSPITAL_COMMUNITY): Payer: Medicare Other

## 2015-12-04 ENCOUNTER — Observation Stay (HOSPITAL_COMMUNITY)
Admission: EM | Admit: 2015-12-04 | Discharge: 2015-12-08 | Disposition: A | Discharge status: Home Health | Payer: Medicare Other | Attending: Internal Medicine | Admitting: Internal Medicine

## 2015-12-04 ENCOUNTER — Ambulatory Visit (INDEPENDENT_AMBULATORY_CARE_PROVIDER_SITE_OTHER): Payer: Medicare Other | Admitting: Family Medicine

## 2015-12-04 ENCOUNTER — Encounter: Payer: Self-pay | Admitting: Family Medicine

## 2015-12-04 ENCOUNTER — Encounter (HOSPITAL_COMMUNITY): Payer: Self-pay | Admitting: *Deleted

## 2015-12-04 VITALS — BP 90/38 | HR 70 | Temp 97.6°F | Wt 214.0 lb

## 2015-12-04 DIAGNOSIS — R0602 Shortness of breath: Secondary | ICD-10-CM | POA: Diagnosis not present

## 2015-12-04 DIAGNOSIS — Z889 Allergy status to unspecified drugs, medicaments and biological substances status: Secondary | ICD-10-CM

## 2015-12-04 DIAGNOSIS — Z79899 Other long term (current) drug therapy: Secondary | ICD-10-CM | POA: Diagnosis not present

## 2015-12-04 DIAGNOSIS — N179 Acute kidney failure, unspecified: Secondary | ICD-10-CM | POA: Diagnosis not present

## 2015-12-04 DIAGNOSIS — I10 Essential (primary) hypertension: Secondary | ICD-10-CM | POA: Diagnosis not present

## 2015-12-04 DIAGNOSIS — I951 Orthostatic hypotension: Secondary | ICD-10-CM | POA: Diagnosis not present

## 2015-12-04 DIAGNOSIS — D638 Anemia in other chronic diseases classified elsewhere: Secondary | ICD-10-CM | POA: Diagnosis present

## 2015-12-04 DIAGNOSIS — E1140 Type 2 diabetes mellitus with diabetic neuropathy, unspecified: Secondary | ICD-10-CM | POA: Insufficient documentation

## 2015-12-04 DIAGNOSIS — E119 Type 2 diabetes mellitus without complications: Secondary | ICD-10-CM

## 2015-12-04 DIAGNOSIS — M109 Gout, unspecified: Secondary | ICD-10-CM

## 2015-12-04 DIAGNOSIS — E1165 Type 2 diabetes mellitus with hyperglycemia: Secondary | ICD-10-CM | POA: Diagnosis not present

## 2015-12-04 DIAGNOSIS — Z794 Long term (current) use of insulin: Secondary | ICD-10-CM | POA: Diagnosis not present

## 2015-12-04 DIAGNOSIS — I272 Pulmonary hypertension, unspecified: Secondary | ICD-10-CM | POA: Insufficient documentation

## 2015-12-04 DIAGNOSIS — D6959 Other secondary thrombocytopenia: Secondary | ICD-10-CM

## 2015-12-04 DIAGNOSIS — R531 Weakness: Secondary | ICD-10-CM | POA: Diagnosis not present

## 2015-12-04 DIAGNOSIS — E860 Dehydration: Secondary | ICD-10-CM | POA: Insufficient documentation

## 2015-12-04 DIAGNOSIS — R600 Localized edema: Secondary | ICD-10-CM | POA: Diagnosis not present

## 2015-12-04 DIAGNOSIS — T50905A Adverse effect of unspecified drugs, medicaments and biological substances, initial encounter: Secondary | ICD-10-CM

## 2015-12-04 DIAGNOSIS — Z7902 Long term (current) use of antithrombotics/antiplatelets: Secondary | ICD-10-CM | POA: Insufficient documentation

## 2015-12-04 DIAGNOSIS — I82411 Acute embolism and thrombosis of right femoral vein: Secondary | ICD-10-CM | POA: Diagnosis not present

## 2015-12-04 DIAGNOSIS — I959 Hypotension, unspecified: Secondary | ICD-10-CM | POA: Diagnosis not present

## 2015-12-04 DIAGNOSIS — R740 Nonspecific elevation of levels of transaminase and lactic acid dehydrogenase [LDH]: Secondary | ICD-10-CM

## 2015-12-04 DIAGNOSIS — N390 Urinary tract infection, site not specified: Secondary | ICD-10-CM | POA: Diagnosis present

## 2015-12-04 DIAGNOSIS — R7401 Elevation of levels of liver transaminase levels: Secondary | ICD-10-CM | POA: Diagnosis present

## 2015-12-04 DIAGNOSIS — E118 Type 2 diabetes mellitus with unspecified complications: Secondary | ICD-10-CM

## 2015-12-04 DIAGNOSIS — D696 Thrombocytopenia, unspecified: Secondary | ICD-10-CM | POA: Insufficient documentation

## 2015-12-04 DIAGNOSIS — Z7982 Long term (current) use of aspirin: Secondary | ICD-10-CM | POA: Diagnosis not present

## 2015-12-04 DIAGNOSIS — Z87891 Personal history of nicotine dependence: Secondary | ICD-10-CM | POA: Insufficient documentation

## 2015-12-04 DIAGNOSIS — I2510 Atherosclerotic heart disease of native coronary artery without angina pectoris: Secondary | ICD-10-CM | POA: Diagnosis not present

## 2015-12-04 DIAGNOSIS — I714 Abdominal aortic aneurysm, without rupture, unspecified: Secondary | ICD-10-CM | POA: Diagnosis present

## 2015-12-04 DIAGNOSIS — E785 Hyperlipidemia, unspecified: Secondary | ICD-10-CM | POA: Insufficient documentation

## 2015-12-04 HISTORY — DX: Personal history of other diseases of the digestive system: Z87.19

## 2015-12-04 HISTORY — DX: Personal history of peptic ulcer disease: Z87.11

## 2015-12-04 HISTORY — DX: Unspecified osteoarthritis, unspecified site: M19.90

## 2015-12-04 LAB — CBC WITH DIFFERENTIAL/PLATELET
Basophils Absolute: 0 10*3/uL (ref 0.0–0.1)
Basophils Relative: 0 %
Eosinophils Absolute: 0.1 10*3/uL (ref 0.0–0.7)
Eosinophils Relative: 1 %
HCT: 26.1 % — ABNORMAL LOW (ref 39.0–52.0)
Hemoglobin: 9.1 g/dL — ABNORMAL LOW (ref 13.0–17.0)
Lymphocytes Relative: 19 %
Lymphs Abs: 1.7 10*3/uL (ref 0.7–4.0)
MCH: 30.6 pg (ref 26.0–34.0)
MCHC: 34.9 g/dL (ref 30.0–36.0)
MCV: 87.9 fL (ref 78.0–100.0)
Monocytes Absolute: 0.3 10*3/uL (ref 0.1–1.0)
Monocytes Relative: 3 %
Neutro Abs: 6.7 10*3/uL (ref 1.7–7.7)
Neutrophils Relative %: 76 %
Platelets: 83 10*3/uL — ABNORMAL LOW (ref 150–400)
RBC: 2.97 MIL/uL — ABNORMAL LOW (ref 4.22–5.81)
RDW: 17.1 % — ABNORMAL HIGH (ref 11.5–15.5)
WBC: 8.8 10*3/uL (ref 4.0–10.5)

## 2015-12-04 LAB — COMPREHENSIVE METABOLIC PANEL
ALT: 112 U/L — ABNORMAL HIGH (ref 17–63)
AST: 246 U/L — ABNORMAL HIGH (ref 15–41)
Albumin: 2.7 g/dL — ABNORMAL LOW (ref 3.5–5.0)
Alkaline Phosphatase: 79 U/L (ref 38–126)
Anion gap: 8 (ref 5–15)
BUN: 43 mg/dL — ABNORMAL HIGH (ref 6–20)
CO2: 19 mmol/L — ABNORMAL LOW (ref 22–32)
Calcium: 9.1 mg/dL (ref 8.9–10.3)
Chloride: 110 mmol/L (ref 101–111)
Creatinine, Ser: 1.84 mg/dL — ABNORMAL HIGH (ref 0.61–1.24)
GFR calc Af Amer: 40 mL/min — ABNORMAL LOW (ref >60)
GFR calc non Af Amer: 35 mL/min — ABNORMAL LOW (ref >60)
Glucose, Bld: 95 mg/dL (ref 65–99)
Potassium: 5.1 mmol/L (ref 3.5–5.1)
Sodium: 137 mmol/L (ref 135–145)
Total Bilirubin: 0.8 mg/dL (ref 0.3–1.2)
Total Protein: 6.3 g/dL — ABNORMAL LOW (ref 6.5–8.1)

## 2015-12-04 LAB — BRAIN NATRIURETIC PEPTIDE: B Natriuretic Peptide: 87.7 pg/mL (ref 0.0–100.0)

## 2015-12-04 LAB — I-STAT TROPONIN, ED: Troponin i, poc: 0.01 ng/mL (ref 0.00–0.08)

## 2015-12-04 LAB — URINALYSIS, ROUTINE W REFLEX MICROSCOPIC
Bilirubin Urine: NEGATIVE
Glucose, UA: NEGATIVE mg/dL
Ketones, ur: NEGATIVE mg/dL
Nitrite: NEGATIVE
Protein, ur: NEGATIVE mg/dL
Specific Gravity, Urine: 1.013 (ref 1.005–1.030)
pH: 5.5 (ref 5.0–8.0)

## 2015-12-04 LAB — GLUCOSE, CAPILLARY
Glucose-Capillary: 109 mg/dL — ABNORMAL HIGH (ref 65–99)
Glucose-Capillary: 108 mg/dL — ABNORMAL HIGH (ref 65–99)

## 2015-12-04 LAB — CBG MONITORING, ED: Glucose-Capillary: 91 mg/dL (ref 65–99)

## 2015-12-04 LAB — URINE MICROSCOPIC-ADD ON

## 2015-12-04 LAB — I-STAT CG4 LACTIC ACID, ED: Lactic Acid, Venous: 1.83 mmol/L (ref 0.5–1.9)

## 2015-12-04 MED ORDER — ASPIRIN 81 MG PO CHEW
81.0000 mg | CHEWABLE_TABLET | Freq: Every day | ORAL | Status: DC
  Administered 2015-12-05 – 2015-12-08 (×4): 81 mg via ORAL
  Filled 2015-12-04 (×4): qty 1

## 2015-12-04 MED ORDER — OMEGA-3-ACID ETHYL ESTERS 1 G PO CAPS
1.0000 g | ORAL_CAPSULE | Freq: Two times a day (BID) | ORAL | Status: DC
  Administered 2015-12-04 – 2015-12-08 (×8): 1 g via ORAL
  Filled 2015-12-04 (×8): qty 1

## 2015-12-04 MED ORDER — ATORVASTATIN CALCIUM 40 MG PO TABS
40.0000 mg | ORAL_TABLET | Freq: Every day | ORAL | Status: DC
  Administered 2015-12-04 – 2015-12-05 (×2): 40 mg via ORAL
  Filled 2015-12-04 (×2): qty 1

## 2015-12-04 MED ORDER — OMEGA-3 FATTY ACIDS 1000 MG PO CAPS: 1.0000 g | ORAL_CAPSULE | Freq: Two times a day (BID) | ORAL | Status: DC

## 2015-12-04 MED ORDER — INSULIN ASPART 100 UNIT/ML ~~LOC~~ SOLN
0.0000 [IU] | Freq: Three times a day (TID) | SUBCUTANEOUS | Status: DC
  Administered 2015-12-05: 2 [IU] via SUBCUTANEOUS
  Administered 2015-12-05: 1 [IU] via SUBCUTANEOUS
  Administered 2015-12-08: 2 [IU] via SUBCUTANEOUS
  Administered 2015-12-08: 1 [IU] via SUBCUTANEOUS

## 2015-12-04 MED ORDER — SODIUM CHLORIDE 0.9 % IV SOLN
INTRAVENOUS | Status: DC
  Administered 2015-12-04 – 2015-12-05 (×2): via INTRAVENOUS

## 2015-12-04 MED ORDER — ACETAMINOPHEN 325 MG PO TABS: 650.0000 mg | ORAL_TABLET | Freq: Four times a day (QID) | ORAL | Status: DC | PRN

## 2015-12-04 MED ORDER — CENTRUM PO TABS: 1.0000 | ORAL_TABLET | Freq: Every day | ORAL | Status: DC

## 2015-12-04 MED ORDER — ACETAMINOPHEN 650 MG RE SUPP: 650.0000 mg | Freq: Four times a day (QID) | RECTAL | Status: DC | PRN

## 2015-12-04 MED ORDER — SODIUM CHLORIDE 0.9 % IV BOLUS (SEPSIS)
500.0000 mL | Freq: Once | INTRAVENOUS | Status: AC
  Administered 2015-12-04: 500 mL via INTRAVENOUS

## 2015-12-04 MED ORDER — INSULIN ASPART 100 UNIT/ML ~~LOC~~ SOLN: 0.0000 [IU] | Freq: Every day | SUBCUTANEOUS | Status: DC

## 2015-12-04 MED ORDER — FERROUS SULFATE 325 (65 FE) MG PO TABS
325.0000 mg | ORAL_TABLET | Freq: Every day | ORAL | Status: DC
  Administered 2015-12-05 – 2015-12-08 (×4): 325 mg via ORAL
  Filled 2015-12-04 (×4): qty 1

## 2015-12-04 MED ORDER — ALLOPURINOL 100 MG PO TABS
100.0000 mg | ORAL_TABLET | Freq: Two times a day (BID) | ORAL | Status: DC
  Administered 2015-12-04 – 2015-12-06 (×4): 100 mg via ORAL
  Filled 2015-12-04 (×4): qty 1

## 2015-12-04 MED ORDER — ADULT MULTIVITAMIN W/MINERALS CH
1.0000 | ORAL_TABLET | Freq: Every day | ORAL | Status: DC
  Administered 2015-12-04 – 2015-12-08 (×5): 1 via ORAL
  Filled 2015-12-04 (×5): qty 1

## 2015-12-04 MED ORDER — VITAMIN B-12 1000 MCG PO TABS
1000.0000 ug | ORAL_TABLET | Freq: Every day | ORAL | Status: DC
  Administered 2015-12-05 – 2015-12-08 (×4): 1000 ug via ORAL
  Filled 2015-12-04 (×4): qty 1

## 2015-12-04 MED ORDER — CLOPIDOGREL BISULFATE 75 MG PO TABS
75.0000 mg | ORAL_TABLET | Freq: Every day | ORAL | Status: DC
  Administered 2015-12-05 – 2015-12-06 (×2): 75 mg via ORAL
  Filled 2015-12-04 (×2): qty 1

## 2015-12-04 NOTE — ED Notes (Signed)
Patient ambulated to the toilet.  Very SOB.  Not well tolerated.

## 2015-12-04 NOTE — ED Triage Notes (Signed)
Pt reports having generalized weakness x3 days. Denies any other symptoms. Appears pale at triage. Pt went to pcp today and sent here due to bp being low. ekg done at triage.

## 2015-12-04 NOTE — ED Provider Notes (Signed)
[facility] DEPT Provider Note   CSN: [id number] Arrival date & time: 12/04/15  0901     History   Chief Complaint Chief Complaint  Patient presents with  . Weakness    HPI Paul [name] is a 75 y.o. male.  The history is provided by the patient. No language interpreter was used.  Weakness    Paul [name] is a 75 y.o. male who presents to the Emergency Department complaining of weakness.  He reports about 10 days of progressive weakness. Over the last 3 days his weakness has been so bad he had to take out his walker and use that to get around. Now he cannot even ambulate due to profound weakness. He denies any fevers, chest pain, shortness of breath, abdominal pain, nausea, vomiting, hematochezia or melena. He had prior similar symptoms in March with no clear cause. He had a gout flare about 3 weeks ago and was on increased colchicine and allopurinol at that time. Past Medical History:  Diagnosis Date  . AAA (abdominal aortic aneurysm) ([place])   . Abnormal LFTs   . AKI (acute kidney injury) ([place])   . Anemia of chronic disease   . Atherosclerosis of native coronary artery of native heart without angina pectoris   . CAD (coronary artery disease)    with cath 09/26/08 showing severe heart disease in RCA with POBA, mild to mod diesease in circ and LAD  . Chronic back pain    back problems prior to surgery   . Constipation   . Coronary atherosclerosis of native coronary artery   . Dehydration   . Diabetes mellitus    with neuropathy  . Diarrhea   . Elevated liver enzymes   . Generalized weakness   . Gout of right hand   . Hiatal hernia   . Hyperlipidemia   . Hypertension   . Metabolic acidosis   . Thrombocytopenia ([place])   . Vitamin B12 deficiency   . Vitamin B12 deficiency   . Vitamin D deficiency   . Weakness     Patient Active Problem List   Diagnosis Date Noted  . Vitamin B12 deficiency 04/22/2015  . Controlled type 2 diabetes mellitus without  complication, without long-term current use of insulin ([place]) 04/18/2015  . Anemia of chronic disease 04/18/2015  . Gout 07/22/2010  . Coronary atherosclerosis of native coronary artery 09/17/2008    Past Surgical History:  Procedure Laterality Date  . BACK SURGERY     x3  . CARPAL TUNNEL RELEASE     L arm   . left ankle surgery    . LEFT HEART CATHETERIZATION WITH CORONARY ANGIOGRAM N/A 01/21/2012   Procedure: LEFT HEART CATHETERIZATION WITH CORONARY ANGIOGRAM;  Surgeon: Burnell Blanks, MD;  Location: [facility] CATH LAB;  Service: Cardiovascular;  Laterality: N/A;  . 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 Authorizing Provider  allopurinol (ZYLOPRIM) 100 MG tablet 2 pills per day Patient taking differently: Take 100 mg by mouth 2 (two) times daily.  11/25/15  Yes Denita Lung, MD  aspirin 81 MG chewable tablet Chew 81 mg by mouth daily.   Yes Historical Provider, MD  clopidogrel (PLAVIX) 75 MG tablet TAKE 1 TABLET BY MOUTH  DAILY 11/10/15  Yes Denita Lung, MD  colchicine 0.6 MG tablet Take 1 tablet (0.6 mg total) by mouth  daily. 09/22/15  Yes Denita Lung, MD  diclofenac (VOLTAREN) 75 MG EC tablet TAKE 1 TABLET(75 MG) BY MOUTH TWICE DAILY 09/23/15  Yes Denita Lung, MD  ferrous sulfate 325 (65 FE) MG tablet Take 325 mg by mouth daily with breakfast.   Yes Historical Provider, MD  fish oil-omega-3 fatty acids 1000 MG capsule Take 1 g by mouth 2 (two) times daily.    Yes Historical Provider, MD  isosorbide mononitrate (IMDUR) 30 MG 24 hr tablet Take 1 tablet by mouth  twice a day 08/11/15  Yes Burnell Blanks, MD  metFORMIN (GLUCOPHAGE-XR) 500 MG 24 hr tablet Take 2 tablets by mouth  daily Patient taking differently: Take 1 tablets by mouth twice daily 07/07/15  Yes Denita Lung, MD  metoprolol tartrate (LOPRESSOR) 25 MG tablet Take 1 tablet by mouth two  times daily 10/13/15   Yes Denita Lung, MD  Multiple Vitamins-Minerals (CENTRUM) tablet Take 1 tablet by mouth daily.   Yes Historical Provider, MD  quinapril (ACCUPRIL) 20 MG tablet Take 1 tablet by mouth  twice a day 08/11/15  Yes Denita Lung, MD  simvastatin (ZOCOR) 80 MG tablet Take 1 tablet by mouth at  bedtime 10/13/15  Yes Denita Lung, MD  vitamin B-12 (CYANOCOBALAMIN) 1000 MCG tablet Take 1 tablet (1,000 mcg total) by mouth daily. 05/09/15  Yes Denita Lung, MD  ACCU-CHEK AVIVA PLUS test strip Test once to twice daily Patient not taking: Reported on 12/04/2015 06/20/15   Denita Lung, MD  nitroGLYCERIN (NITROSTAT) 0.4 MG SL tablet Place 0.4 mg under the tongue every 5 (five) minutes as needed for chest pain. For 3 doses 10/12/11   Denita Lung, MD    Family History Family History  Problem Relation Age of Onset  . Heart attack Mother   . Cancer Mother   . Heart attack Sister     Social History Social History  Substance Use Topics  . Smoking status: Former Smoker    Types: Cigars  . Smokeless tobacco: Never Used     Comment: quit smoking cigars right before christmas 2014!  Marland Kitchen Alcohol use No     Allergies   Review of patient's allergies indicates no known allergies.   Review of Systems Review of Systems  Neurological: Positive for weakness.  All other systems reviewed and are negative.    Physical Exam Updated Vital Signs BP (!) 107/52   Pulse (!) 59   Temp 97.6 F (36.4 C) (Oral)   Resp 19   SpO2 98%   Physical Exam  Constitutional: He is oriented to person, place, and time. He appears well-developed and well-nourished.  HENT:  Head: Normocephalic and atraumatic.  Cardiovascular: Normal rate and regular rhythm.   No murmur heard. Pulmonary/Chest: Effort normal and breath sounds normal. No respiratory distress.  Abdominal: Soft. There is no tenderness. There is no rebound and no guarding.  Musculoskeletal: He exhibits no tenderness.  1+ pitting edema in bilateral lower  extremities  Neurological: He is alert and oriented to person, place, and time.  Generalized weakness  Skin: Skin is warm and dry. There is pallor.  Psychiatric: He has a normal mood and affect. His behavior is normal.  Nursing note and vitals reviewed.    ED Treatments / Results  Labs (all labs ordered are listed, but only abnormal results are displayed) Labs Reviewed  COMPREHENSIVE METABOLIC PANEL - Abnormal; Notable for the following:       Result Value  CO2 19 (*)    BUN 43 (*)    Creatinine, Ser 1.84 (*)    Total Protein 6.3 (*)    Albumin 2.7 (*)    AST 246 (*)    ALT 112 (*)    GFR calc non Af Amer 35 (*)    GFR calc Af Amer 40 (*)    All other components within normal limits  CBC WITH DIFFERENTIAL/PLATELET - Abnormal; Notable for the following:    RBC 2.97 (*)    Hemoglobin 9.1 (*)    HCT 26.1 (*)    RDW 17.1 (*)    All other components within normal limits  URINE CULTURE  URINALYSIS, ROUTINE W REFLEX MICROSCOPIC (NOT AT [facility])  BRAIN NATRIURETIC PEPTIDE  I-STAT CG4 LACTIC ACID, ED  I-STAT TROPOININ, ED    EKG  EKG Interpretation  Date/Time:  Thursday December 04 2015 09:07:46 EDT Ventricular Rate:  64 PR Interval:  152 QRS Duration: 82 QT Interval:  406 QTC Calculation: 418 R Axis:   15 Text Interpretation:  Normal sinus rhythm Normal ECG Confirmed by Hazle Coca [phone]) on 12/04/2015 9:18:31 AM       Radiology Dg Chest Port 1 View  Result Date: 12/04/2015 CLINICAL DATA:  Hypotension and weakness. EXAM: PORTABLE CHEST 1 VIEW COMPARISON:  01/23/2007 FINDINGS: Left lung base is not completely imaged. Mild interstitial prominence in the lower lungs. Otherwise, the lungs are clear. Heart size is within normal limits and stable. The trachea is midline. IMPRESSION: Mild interstitial prominence in the lower lungs is nonspecific. No evidence for frank pulmonary edema or focal airspace disease. Electronically Signed   By: Markus Daft M.D.   On: 12/04/2015 09:40     Procedures Procedures (including critical care time)  Medications Ordered in ED Medications - No data to display   Initial Impression / Assessment and Plan / ED Course  I have reviewed the triage vital signs and the nursing notes.  Pertinent labs & imaging results that were available during my care of the patient were reviewed by me and considered in my medical decision making (see chart for details).  Clinical Course    Patient here for progressive weakness and gait difficulties with hypotension at his primary care provider's office. He is normotensive in the emergency department but considerably weak. BMP demonstrates acute kidney injury. No evidence of acute infectious process at this time. Plan to admit to the hospitalist service for IV fluid hydration and further testing. Hospitalist consulted for admission.   Final Clinical Impressions(s) / ED Diagnoses   Final diagnoses:  None    New Prescriptions New Prescriptions   No medications on file     Quintella Reichert, MD 12/05/15 [phone]

## 2015-12-04 NOTE — ED Notes (Signed)
Attempted report x1. 

## 2015-12-04 NOTE — ED Notes (Signed)
Pt assisted with standing at bedside to attempt to provide urine sample. Unsuccessful attempt. MD aware, will perform in-out cath.

## 2015-12-04 NOTE — H&P (Signed)
History and Physical    Paul [name] BTD:[id number] DOB: [birth year] DOA: 12/04/2015   PCP: Wyatt Haste, MD   Patient coming from/Resides with: Private residence/lives with wife  Admission status: Observation/medical floor -will need to be reevaluated in the next 24 hours to determine if it will be medically necessary to stay a minimum 2 midnights to rule out impending and/or unexpected changes in physiologic status that may differ from initial evaluation performed in the ER and/or at time of admission. Patient presents with generalized weakness, hypotension at PCP office and labs in ER revealed acute kidney injury and transaminitis concerning for dehydration. Orthostatic vital signs are positive. On exam was found to have right lower extremity edema ongoing for 2 weeks.  Chief Complaint: Weakness  HPI: Paul [name] is a 75 y.o. male with medical history significant for known CAD, diabetes on metformin, CAD, anemia of chronic disease. Patient recently treated in the early part of October for a Escherichia coli UTI sensitive to Levaquin. Subsequently after that patient developed significant bilateral gout symptoms involving ankles knees and hands with edema and redness and PCP continued allopurinol and resume colchicine. The past several days has an has reported increased generalized weakness despite drinking up to 4 bottles of water yesterday as well as some coffee. He has not had any nausea vomiting diarrhea. Any chest pain or shortness of breath. He has noticed some slight bilateral lower extremity swelling more prominent on the right for several weeks. With primary care physician today and was found to be hypotensive (unable to locate old pressure reading in preadmission chart) and was also noted to be pale in appearance  ED Course:  Vital Signs: BP (!) 129/48   Pulse 63   Temp 97.6 F (36.4 C) (Oral)   Resp 15   SpO2 99%  PCXR: Mild interstitial prominence and lower lungs  no frank pulmonary edema or focal airspace disease Right upper quadrant abdominal ultrasound: No acute hepato-biliary abnormality, subcentimeter splenic granulomas, 3.4 cm infrarenal abdominal aortic aneurysm. Lab data: Sodium 137, potassium 5.1, CO2 19, BUN 43, creatinine 1.84, albumin 2.7, AST 246, ALT 112, total routine 6.3, total bilirubin 0.8, BNP 88, poc troponin 0.01, lactic acid 1.83, LV BCs 8800 with normal differential, hemoglobin 9.1, platelets 83,000, urinalysis and culture have been obtained and results are pending Medications and treatments: Normal saline bolus 500 mL 1  Review of Systems:  In addition to the HPI above,  No Fever-chills, myalgias or other constitutional symptoms No Headache, changes with Vision or hearing, new focal weakness, tingling, numbness in any extremity, dizziness, dysarthria or word finding difficulty, gait disturbance or imbalance, tremors or seizure activity No problems swallowing food or Liquids, indigestion/reflux, choking or coughing while eating, abdominal pain with or after eating No Chest pain, Cough or Shortness of Breath, palpitations, orthopnea or DOE No Abdominal pain, N/V, melena,hematochezia, dark tarry stools, constipation No dysuria, malodorous urine, hematuria or flank pain No new skin rashes, lesions, masses or bruises, No new joint pains, aches, swelling or redness No recent unintentional weight gain or loss No polyuria, polydypsia or polyphagia   Past Medical History:  Diagnosis Date  . AAA (abdominal aortic aneurysm) ([place])   . Abnormal LFTs   . AKI (acute kidney injury) ([place])   . Anemia of chronic disease   . Atherosclerosis of native coronary artery of native heart without angina pectoris   . CAD (coronary artery disease)    with cath 09/26/08 showing severe heart disease in RCA with POBA,  mild to mod diesease in circ and LAD  . Chronic back pain    back problems prior to surgery   . Constipation   . Coronary atherosclerosis  of native coronary artery   . Dehydration   . Diabetes mellitus    with neuropathy  . Diarrhea   . Elevated liver enzymes   . Generalized weakness   . Gout of right hand   . Hiatal hernia   . Hyperlipidemia   . Hypertension   . Metabolic acidosis   . Thrombocytopenia ([place])   . Vitamin B12 deficiency   . Vitamin B12 deficiency   . Vitamin D deficiency   . Weakness     Past Surgical History:  Procedure Laterality Date  . BACK SURGERY     x3  . CARPAL TUNNEL RELEASE     L arm   . left ankle surgery    . LEFT HEART CATHETERIZATION WITH CORONARY ANGIOGRAM N/A 01/21/2012   Procedure: LEFT HEART CATHETERIZATION WITH CORONARY ANGIOGRAM;  Surgeon: Burnell Blanks, MD;  Location: [facility] CATH LAB;  Service: Cardiovascular;  Laterality: N/A;  . 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 Marital status: Married    Spouse name: N/A  . Number of children: N/A  . Years of education: N/A   Occupational History  . Not on file.   Social History Main Topics  . Smoking status: Former Smoker    Types: Cigars  . Smokeless tobacco: Never Used     Comment: quit smoking cigars right before christmas 2014!  Marland Kitchen Alcohol use No  . Drug use: No  . Sexual activity: Yes   Other Topics Concern  . Not on file   Social History Narrative   Divorced, 1 child.    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    Owned roofing co. In [place]    Mobility: Without assistive devices Work history: Retired   No Known Allergies  Family History  Problem Relation Age of Onset  . Heart attack Mother   . Cancer Mother   . Heart attack Sister      Prior to Admission medications   Medication Sig Start Date End Date Taking? Authorizing Provider  allopurinol (ZYLOPRIM) 100 MG tablet 2 pills per day Patient taking differently: Take 100 mg by mouth 2 (two) times daily.  11/25/15  Yes Denita Lung, MD  aspirin 81 MG chewable tablet Chew 81 mg  by mouth daily.   Yes Historical Provider, MD  clopidogrel (PLAVIX) 75 MG tablet TAKE 1 TABLET BY MOUTH  DAILY 11/10/15  Yes Denita Lung, MD  colchicine 0.6 MG tablet Take 1 tablet (0.6 mg total) by mouth daily. 09/22/15  Yes Denita Lung, MD  diclofenac (VOLTAREN) 75 MG EC tablet TAKE 1 TABLET(75 MG) BY MOUTH TWICE DAILY 09/23/15  Yes Denita Lung, MD  ferrous sulfate 325 (65 FE) MG tablet Take 325 mg by mouth daily with breakfast.   Yes Historical Provider, MD  fish oil-omega-3 fatty acids 1000 MG capsule Take 1 g by mouth 2 (two) times daily.    Yes Historical Provider, MD  isosorbide mononitrate (IMDUR) 30 MG 24 hr tablet Take 1 tablet by mouth  twice a day 08/11/15  Yes Burnell Blanks, MD  metFORMIN (GLUCOPHAGE-XR) 500 MG 24 hr tablet Take 2 tablets by mouth  daily Patient taking differently: Take 1 tablets by mouth  twice daily 07/07/15  Yes Denita Lung, MD  metoprolol tartrate (LOPRESSOR) 25 MG tablet Take 1 tablet by mouth two  times daily 10/13/15  Yes Denita Lung, MD  Multiple Vitamins-Minerals (CENTRUM) tablet Take 1 tablet by mouth daily.   Yes Historical Provider, MD  quinapril (ACCUPRIL) 20 MG tablet Take 1 tablet by mouth  twice a day 08/11/15  Yes Denita Lung, MD  simvastatin (ZOCOR) 80 MG tablet Take 1 tablet by mouth at  bedtime 10/13/15  Yes Denita Lung, MD  vitamin B-12 (CYANOCOBALAMIN) 1000 MCG tablet Take 1 tablet (1,000 mcg total) by mouth daily. 05/09/15  Yes Denita Lung, MD  ACCU-CHEK AVIVA PLUS test strip Test once to twice daily Patient not taking: Reported on 12/04/2015 06/20/15   Denita Lung, MD  nitroGLYCERIN (NITROSTAT) 0.4 MG SL tablet Place 0.4 mg under the tongue every 5 (five) minutes as needed for chest pain. For 3 doses 10/12/11   Denita Lung, MD    Physical Exam: Vitals:   12/04/15 0915 12/04/15 0930 12/04/15 0945 12/04/15 1115  BP: 125/61 105/60 (!) 107/52 (!) 129/48  Pulse: 66 62 (!) 59 63  Resp: 25 17 19 15   Temp:      TempSrc:       SpO2: 95% 96% 98% 99%      Constitutional: NAD, calm, comfortable,Slightly pale Eyes: PERRL, lids and conjunctivae normal ENMT: Mucous membranes are dry. Posterior pharynx clear of any exudate or lesions.Normal dentition.  Neck: normal, supple, no masses, no thyromegaly Respiratory: clear to auscultation bilaterally, no wheezing, no crackles. Normal respiratory effort. No accessory muscle use.  Cardiovascular: Regular rate and rhythm, no murmurs / rubs / gallops. Nonpitting bilateral lower extremity edema-right-sided edema more notable. 2+ pedal pulses. No carotid bruits. OVS were positive with decreased blood pressure from supine to sitting and increased heart rate from sitting to standing Abdomen: no tenderness, no masses palpated. No hepatosplenomegaly. Bowel sounds positive.  Musculoskeletal: no clubbing / cyanosis. No joint deformity upper and lower extremities. Good ROM, no contractures. Normal muscle tone.  Skin: no rashes, lesions, ulcers. No induration Neurologic: CN 2-12 grossly intact. Sensation intact, DTR normal. Strength 5/5 x all 4 extremities.  Psychiatric: Normal judgment and insight. Alert and oriented x 3. Normal mood.    Labs on Admission: I have personally reviewed following labs and imaging studies  CBC:  Recent Labs Lab 12/04/15 0920  WBC 8.8  NEUTROABS 6.7  HGB 9.1*  HCT 26.1*  MCV 87.9  PLT 83*   Basic Metabolic Panel:  Recent Labs Lab 12/04/15 0920  NA 137  K 5.1  CL 110  CO2 19*  GLUCOSE 95  BUN 43*  CREATININE 1.84*  CALCIUM 9.1   GFR: Estimated Creatinine Clearance: 41.8 mL/min (by C-G formula based on SCr of 1.84 mg/dL (H)). Liver Function Tests:  Recent Labs Lab 12/04/15 0920  AST 246*  ALT 112*  ALKPHOS 79  BILITOT 0.8  PROT 6.3*  ALBUMIN 2.7*   No results for input(s): LIPASE, AMYLASE in the last 168 hours. No results for input(s): AMMONIA in the last 168 hours. Coagulation Profile: No results for input(s): INR,  PROTIME in the last 168 hours. Cardiac Enzymes: No results for input(s): CKTOTAL, CKMB, CKMBINDEX, TROPONINI in the last 168 hours. BNP (last 3 results) No results for input(s): PROBNP in the last 8760 hours. HbA1C: No results for input(s): HGBA1C in the last 72 hours. CBG:  Recent Labs Lab 12/04/15 Francis  91   Lipid Profile: No results for input(s): CHOL, HDL, LDLCALC, TRIG, CHOLHDL, LDLDIRECT in the last 72 hours. Thyroid Function Tests: No results for input(s): TSH, T4TOTAL, FREET4, T3FREE, THYROIDAB in the last 72 hours. Anemia Panel: No results for input(s): VITAMINB12, FOLATE, FERRITIN, TIBC, IRON, RETICCTPCT in the last 72 hours. Urine analysis:    Component Value Date/Time   COLORURINE YELLOW 04/26/2015 1448   APPEARANCEUR CLEAR 04/26/2015 1448   LABSPEC 1.016 04/26/2015 1448   PHURINE 6.0 04/26/2015 1448   GLUCOSEU NEGATIVE 04/26/2015 1448   HGBUR NEGATIVE 04/26/2015 1448   BILIRUBINUR n 11/17/2015 0911   KETONESUR NEGATIVE 04/26/2015 1448   PROTEINUR pos 11/17/2015 0911   PROTEINUR NEGATIVE 04/26/2015 1448   UROBILINOGEN negative 11/17/2015 0911   NITRITE pos 11/17/2015 0911   NITRITE NEGATIVE 04/26/2015 1448   LEUKOCYTESUR small (1+) (A) 11/17/2015 0911   Sepsis Labs: @LABRCNTIP (procalcitonin:4,lacticidven:4) )No results found for this or any previous visit (from the past 240 hour(s)).   Radiological Exams on Admission: US Abdomen Complete  Result Date: 12/04/2015 CLINICAL DATA:  Hypotension, weakness. EXAM: ABDOMEN ULTRASOUND COMPLETE COMPARISON:  Right upper quadrant abdominal ultrasound of April 18, 2015 FINDINGS: Gallbladder: No gallstones or wall thickening visualized. No sonographic Murphy sign noted by sonographer. Common bile duct: Diameter: 4.2 mm Liver: No focal lesion identified. Within normal limits in parenchymal echogenicity. IVC: No abnormality visualized. Pancreas: Visualized portion unremarkable. Spleen: Size and appearance within normal  limits. Echogenic foci error compatible with subcentimeter granulomas. Right Kidney: Length: 11.2 cm. Echogenicity within normal limits. No mass or hydronephrosis visualized. Left Kidney: Length: 12.0 cm. Echogenicity within normal limits. No mass or hydronephrosis visualized. Abdominal aorta: No aneurysm visualized. Other findings: There is a distal abdominal aortic aneurysm measuring 3.4 cm in diameter. The proximal aorta measures 2.2 cm in diameter. The mid aorta also measures 2.2 cm in diameter. IMPRESSION: 1. No acute hepatobiliary abnormality. 2. Sub cm splenic granulomas. 3. 3.4 cm infrarenal abdominal aortic aneurysm. Recommend followup by ultrasound in 3 years. This recommendation follows ACR consensus guidelines: White Paper of the ACR Incidental Findings Committee II on Vascular Findings. Joellyn Rued Radiol 2013; [phone] Electronically Signed   By: David  Martinique M.D.   On: 12/04/2015 11:05   Dg Chest Port 1 View  Result Date: 12/04/2015 CLINICAL DATA:  Hypotension and weakness. EXAM: PORTABLE CHEST 1 VIEW COMPARISON:  01/23/2007 FINDINGS: Left lung base is not completely imaged. Mild interstitial prominence in the lower lungs. Otherwise, the lungs are clear. Heart size is within normal limits and stable. The trachea is midline. IMPRESSION: Mild interstitial prominence in the lower lungs is nonspecific. No evidence for frank pulmonary edema or focal airspace disease. Electronically Signed   By: Markus Daft M.D.   On: 12/04/2015 09:40    EKG: (Independently reviewed) sinus rhythm with ventricular rate 64 bpm, QTC 480 ms, no ischemic changes slightly peaked T waves and septal leads  Assessment/Plan Principal Problem:   Acute kidney injury /generalized weakness -Presents with generalized weakness and found to be orthostatic and likely dehydrated -Suspect combination of low perfusion from orthostatic hypotension and other offending medications on at home (colchicine, Accupril, metformin)  contributing to mild acute kidney injury -Hold offending medications -Gentle IV fluid hydration -Follow chemistries -If weakness persists after correction of electrolyte abnormalities and orthostatic hypotension recommend PT/OT evaluation  Active Problems:   Transaminitis -Similar findings and past nonobstructive without elevation in total bilirubin and presumed related to dehydration and low blood pressure readings -Abdominal ultrasound unremarkable for  biliary etiology -Repeat labs in a.m. -Treat underlying causes such as hypotension    Edema of right lower extremity -Patient reports ongoing for several weeks and nonpainful -Obtain right lower extremity venous duplex to rule out DVT  **Nursing tech documented patient became quite short of breath with ambulating to the bathroom so I've asked for ambulatory pulse oximetry-if hypoxemic may need to start on full dose anticoagulation to treat for possible DVT/PE until diagnosis either confirm or rule out    Orthostatic hypotension -Patient denies recent GI illness or diarrhea; also denies poor oral intake and states drinks 4 bottles of 20 ounce water in the previous 24 hours and also had some caffeinated coffee -OVS were positive with systolic blood pressure decreased from supine to sitting with increased systolic blood pressure with standing but also increased heart rate -Hold antihypertensive medications for now -IV fluids as above -Repeat orthostatic vital signs in a.m.    Gout -Previous symptoms have resolved -Holding colchicine and likely does not need until has another acute flare -Continue allopurinol    Controlled type 2 diabetes mellitus without complication, without long-term current use of insulin  -Hold metformin -mildly Hyperglycemic -Follow CBGs and provide SSI    Thrombocytopenia  -Intermittently recurring and typically associated with recent UTI -Utilize SCDs for DVT prophylaxis -Follow labs -No indication to  discontinue Plavix or aspirin at this juncture -Thrombocytopenia has been treated with the son in the past    Recent UTI (urinary tract infection) -Treated with Levaquin for sensitive Escherichia coli -Repeat urinalysis and culture to ensure clearing especially with current symptoms and thrombocytopenia    Coronary atherosclerosis of native coronary artery -Currently no chest pain or shortness of breath -Has known RCA obstruction not amenable to PCI -Continue aspirin and Plavix -Beta blocker on hold at least for the first 24 hours due to recent issues with orthostatic hypotension with associated transaminitis and acute kidney injury -Last echo was in 2010 so will repeat this admission    Anemia of chronic disease -Hemoglobin stable at baseline    AAA (abdominal aortic aneurysm) ([name])-infrarenal 3.4 cm -Incidental finding on abdominal ultrasound -Radiologist recommends follow-up ultrasound in 3 years      DVT prophylaxis: SCDs Code Status:  Full Family Communication: No family at bedside at time of admission Disposition Plan: Anticipate discharge back to preadmission home environment once medically stable Consults called: None     Jazlen Ogarro L. ANP-BC [facility] Pager [phone]   If 7PM-7AM, please contact night-coverage [email or web address] Password [email or web address]  12/04/2015, 11:59 AM

## 2015-12-04 NOTE — Progress Notes (Signed)
   Subjective:    Patient ID: Paul [name], male    DOB: Jan 09, 1941, 75 y.o.   MRN: [id number]  HPI He is here for a recheck. He plays of increasing difficulty with weakness and shortness of breath. He states that this got worse really the day after he was last seen. Review of that record does indicate a low blood pressure however he was symptomatic at that time. He did have a cardiology appointment after that but states he was unable to make it due to weakness. He does not complain of chest pain, heart heart rate change, fever, chills, nausea, vomiting or diarrhea. Medications were reviewed. On his May visit here asking to hold quinapril however he has continued on that. He also had a recent urinalysis and culture which did grow Escherichia coli. He was placed on Levaquin. Review of the record indicates he was admitted to the hospital for similar symptoms in March and sent for rehabilitation after that. He has a history of anemia   Review of Systems     Objective:   Physical Exam Alert and quite pale appearing. Cardiac exam shows regular rhythm without murmurs or gallops. Lungs are clear to auscultation. EKG shows no acute changes       Assessment & Plan:  Hypotension, unspecified hypotension type  Controlled type 2 diabetes mellitus without complication, without long-term current use of insulin ([name])  Urinary tract infection without hematuria, site unspecified  Gout of foot, unspecified cause, unspecified chronicity, unspecified laterality  Anemia of chronic disease I will send him to the emergency room for further evaluation.

## 2015-12-05 ENCOUNTER — Observation Stay (HOSPITAL_COMMUNITY): Payer: Medicare Other

## 2015-12-05 ENCOUNTER — Telehealth: Payer: Self-pay | Admitting: Cardiovascular Disease

## 2015-12-05 ENCOUNTER — Observation Stay (HOSPITAL_BASED_OUTPATIENT_CLINIC_OR_DEPARTMENT_OTHER): Payer: Medicare Other

## 2015-12-05 ENCOUNTER — Encounter (HOSPITAL_COMMUNITY): Payer: Self-pay | Admitting: Radiology

## 2015-12-05 DIAGNOSIS — R600 Localized edema: Secondary | ICD-10-CM | POA: Diagnosis not present

## 2015-12-05 DIAGNOSIS — N179 Acute kidney failure, unspecified: Secondary | ICD-10-CM

## 2015-12-05 DIAGNOSIS — R0602 Shortness of breath: Secondary | ICD-10-CM | POA: Diagnosis not present

## 2015-12-05 DIAGNOSIS — I82411 Acute embolism and thrombosis of right femoral vein: Secondary | ICD-10-CM | POA: Diagnosis not present

## 2015-12-05 DIAGNOSIS — I2510 Atherosclerotic heart disease of native coronary artery without angina pectoris: Secondary | ICD-10-CM | POA: Diagnosis not present

## 2015-12-05 DIAGNOSIS — D696 Thrombocytopenia, unspecified: Secondary | ICD-10-CM

## 2015-12-05 DIAGNOSIS — I824Y1 Acute embolism and thrombosis of unspecified deep veins of right proximal lower extremity: Secondary | ICD-10-CM | POA: Diagnosis not present

## 2015-12-05 DIAGNOSIS — E119 Type 2 diabetes mellitus without complications: Secondary | ICD-10-CM

## 2015-12-05 DIAGNOSIS — I951 Orthostatic hypotension: Secondary | ICD-10-CM | POA: Diagnosis not present

## 2015-12-05 LAB — CBC
HCT: 23.4 % — ABNORMAL LOW (ref 39.0–52.0)
Hemoglobin: 8.2 g/dL — ABNORMAL LOW (ref 13.0–17.0)
MCH: 31.2 pg (ref 26.0–34.0)
MCHC: 35 g/dL (ref 30.0–36.0)
MCV: 89 fL (ref 78.0–100.0)
Platelets: 71 10*3/uL — ABNORMAL LOW (ref 150–400)
RBC: 2.63 MIL/uL — ABNORMAL LOW (ref 4.22–5.81)
RDW: 17.3 % — ABNORMAL HIGH (ref 11.5–15.5)
WBC: 5.8 10*3/uL (ref 4.0–10.5)

## 2015-12-05 LAB — COMPREHENSIVE METABOLIC PANEL
ALT: 124 U/L — ABNORMAL HIGH (ref 17–63)
AST: 332 U/L — ABNORMAL HIGH (ref 15–41)
Albumin: 2.2 g/dL — ABNORMAL LOW (ref 3.5–5.0)
Alkaline Phosphatase: 85 U/L (ref 38–126)
Anion gap: 3 — ABNORMAL LOW (ref 5–15)
BUN: 35 mg/dL — ABNORMAL HIGH (ref 6–20)
CO2: 21 mmol/L — ABNORMAL LOW (ref 22–32)
Calcium: 8.7 mg/dL — ABNORMAL LOW (ref 8.9–10.3)
Chloride: 115 mmol/L — ABNORMAL HIGH (ref 101–111)
Creatinine, Ser: 1.26 mg/dL — ABNORMAL HIGH (ref 0.61–1.24)
GFR calc Af Amer: >60 mL/min (ref >60)
GFR calc non Af Amer: 55 mL/min — ABNORMAL LOW (ref >60)
Glucose, Bld: 100 mg/dL — ABNORMAL HIGH (ref 65–99)
Potassium: 4.6 mmol/L (ref 3.5–5.1)
Sodium: 139 mmol/L (ref 135–145)
Total Bilirubin: 0.7 mg/dL (ref 0.3–1.2)
Total Protein: 5.4 g/dL — ABNORMAL LOW (ref 6.5–8.1)

## 2015-12-05 LAB — URINE CULTURE: Culture: NO GROWTH

## 2015-12-05 LAB — GLUCOSE, CAPILLARY
Glucose-Capillary: 100 mg/dL — ABNORMAL HIGH (ref 65–99)
Glucose-Capillary: 156 mg/dL — ABNORMAL HIGH (ref 65–99)
Glucose-Capillary: 123 mg/dL — ABNORMAL HIGH (ref 65–99)
Glucose-Capillary: 128 mg/dL — ABNORMAL HIGH (ref 65–99)

## 2015-12-05 LAB — HEMOGLOBIN A1C
Hgb A1c MFr Bld: 6.3 % — ABNORMAL HIGH (ref 4.8–5.6)
Mean Plasma Glucose: 134 mg/dL

## 2015-12-05 MED ORDER — IOPAMIDOL (ISOVUE-370) INJECTION 76%
INTRAVENOUS | Status: AC
  Administered 2015-12-05: 100 mL
  Filled 2015-12-05: qty 100

## 2015-12-05 NOTE — Progress Notes (Signed)
  Echocardiogram 2D Echocardiogram has been performed.  Paul [name] 12/05/2015, 4:19 PM

## 2015-12-05 NOTE — Telephone Encounter (Signed)
Left message to call back  

## 2015-12-05 NOTE — Telephone Encounter (Signed)
Pt sister is calling to let us know her brother is in the hospital and would like a call back.

## 2015-12-05 NOTE — Telephone Encounter (Signed)
I spoke with pt's sister and told her I would let Dr. Angelena Form know about pt's admission. I made sister aware Dr. Angelena Form is off until Monday.  I told her if hospital felt cardiology consult needed they would contact us.

## 2015-12-05 NOTE — Progress Notes (Addendum)
PROGRESS NOTE    Paul [name]  YQM:[id number] DOB: 12-Sep-1940 DOA: 12/04/2015 PCP: Wyatt Haste, MD    Brief Narrative:  Patient is 75 year old gentleman who presented with profound hypotension felt secondary to dehydration resulting acute kidney injury and mild transaminitis. Lower extremity Dopplers ordered to rule out DVT. Patient placed on IV fluids. 2-D echo pending.   Assessment & Plan:   Principal Problem:   Acute kidney injury ([place]) Active Problems:   Coronary atherosclerosis of native coronary artery   Gout   Controlled type 2 diabetes mellitus without complication, without long-term current use of insulin ([name])   Thrombocytopenia ([name])   Anemia of chronic disease   AAA (abdominal aortic aneurysm) ([name])-infrarenal 3.4 cm   Recent UTI (urinary tract infection)   Transaminitis   Edema of right lower extremity   Orthostatic hypotension  #1 acute kidney injury Felt initially to be secondary to prerenal azotemia as patient was noted to be orthostatic on admission in the setting of ACE inhibitor and Voltaren. Patient has been hydrated with IV fluids with improvement would renal function. Follow.  #2 orthostatic hypotension Improved with hydration.  #3 shortness of breath Patient noted to be significantly short of breath on ambulation today. Patient noted to have lower extremity swelling and currently awaiting Dopplers of the lower extremities to rule out DVT. Will check a CT angiogram of the chest which was negative for PE however did show some groundglass opacities particularly in the bases with interstitial thickening favoring edema. Nodularity of liver contour suggesting cirrhosis. 2-D echo pending. Saline lock IV fluids. Patient seems to be auto diuresing and is -1.65 L during this hospitalization.  #4 right lower extremity edema Lower extremity Dopplers pending.  #5 type 2 diabetes mellitus Hemoglobin A1c 6.3. CBGs have ranged from 100-156. Continue  sliding scale insulin.  #6 history of gout Continue allopurinol.  #7 coronary artery disease Currently stable. 2-D echo pending. Continue aspirin, Lipitor, Plavix, Lovaza. Follow.  #8 thrombocytopenia Intermittently reoccurring felt secondary to recent UTI/infection. Plavix and aspirin on hold. Patient with no bleeding. Follow.  #9 recent UTI Status post Levaquin which was sensitive to Escherichia coli. Repeat UA negative.    DVT prophylaxis: SCDs. Code Status: Full Family Communication: Updated patient. No family at bedside. Disposition Plan: Likely skilled nursing facility when medically stable.   Consultants:   None  Procedures:   CT angiogram 12/05/2015  2-D echo 12/05/2015  Chest x-ray 12/04/2015  Abdominal US 12/04/2015  Antimicrobials:   None   Subjective: Patient sitting up at bedside eating lunch. Patient stated that significantly short of breath on ambulation today with nursing. Patient denies any chest pain.  Objective: Vitals:   12/04/15 2057 12/05/15 0400 12/05/15 0447 12/05/15 0811  BP: (!) 112/55  (!) 118/41   Pulse: 89  70   Resp: 19  20 20   Temp: 98.6 F (37 C)  98.5 F (36.9 C) 98.2 F (36.8 C)  TempSrc: Oral  Oral Oral  SpO2: 98%  98%   Weight: 97.2 kg (214 lb 4.6 oz)     Height:  5\' 10"  (1.778 m)      Intake/Output Summary (Last 24 hours) at 12/05/15 1337 Last data filed at 12/05/15 0900  Gross per 24 hour  Intake             1600 ml  Output             2700 ml  Net            -  1100 ml   Filed Weights   12/04/15 2057  Weight: 97.2 kg (214 lb 4.6 oz)    Examination:  General exam: Appears calm and comfortable  Respiratory system: Clear to auscultation. Respiratory effort normal. Cardiovascular system: S1 & S2 heard, RRR. No JVD, murmurs, rubs, gallops or clicks. No pedal edema. Gastrointestinal system: Abdomen is nondistended, soft and nontender. No organomegaly or masses felt. Normal bowel sounds heard. Central nervous  system: Alert and oriented. No focal neurological deficits. Extremities: Symmetric 5 x 5 power. Skin: No rashes, lesions or ulcers Psychiatry: Judgement and insight appear normal. Mood & affect appropriate.     Data Reviewed: I have personally reviewed following labs and imaging studies  CBC:  Recent Labs Lab 12/04/15 0920 12/05/15 0317  WBC 8.8 5.8  NEUTROABS 6.7  --   HGB 9.1* 8.2*  HCT 26.1* 23.4*  MCV 87.9 89.0  PLT 83* 71*   Basic Metabolic Panel:  Recent Labs Lab 12/04/15 0920 12/05/15 0317  NA 137 139  K 5.1 4.6  CL 110 115*  CO2 19* 21*  GLUCOSE 95 100*  BUN 43* 35*  CREATININE 1.84* 1.26*  CALCIUM 9.1 8.7*   GFR: Estimated Creatinine Clearance: 61.1 mL/min (by C-G formula based on SCr of 1.26 mg/dL (H)). Liver Function Tests:  Recent Labs Lab 12/04/15 0920 12/05/15 0317  AST 246* 332*  ALT 112* 124*  ALKPHOS 79 85  BILITOT 0.8 0.7  PROT 6.3* 5.4*  ALBUMIN 2.7* 2.2*   No results for input(s): LIPASE, AMYLASE in the last 168 hours. No results for input(s): AMMONIA in the last 168 hours. Coagulation Profile: No results for input(s): INR, PROTIME in the last 168 hours. Cardiac Enzymes: No results for input(s): CKTOTAL, CKMB, CKMBINDEX, TROPONINI in the last 168 hours. BNP (last 3 results) No results for input(s): PROBNP in the last 8760 hours. HbA1C:  Recent Labs  12/04/15 1131  HGBA1C 6.3*   CBG:  Recent Labs Lab 12/04/15 1139 12/04/15 1629 12/04/15 2054 12/05/15 0718 12/05/15 1138  GLUCAP 91 109* 108* 100* 156*   Lipid Profile: No results for input(s): CHOL, HDL, LDLCALC, TRIG, CHOLHDL, LDLDIRECT in the last 72 hours. Thyroid Function Tests: No results for input(s): TSH, T4TOTAL, FREET4, T3FREE, THYROIDAB in the last 72 hours. Anemia Panel: No results for input(s): VITAMINB12, FOLATE, FERRITIN, TIBC, IRON, RETICCTPCT in the last 72 hours. Sepsis Labs:  Recent Labs Lab 12/04/15 0929  LATICACIDVEN 1.83    Recent Results  (from the past 240 hour(s))  Urine culture     Status: None   Collection Time: 12/04/15 11:37 AM  Result Value Ref Range Status   Specimen Description URINE, CATHETERIZED  Final   Special Requests NONE  Final   Culture NO GROWTH  Final   Report Status 12/05/2015 FINAL  Final         Radiology Studies: US Abdomen Complete  Result Date: 12/04/2015 CLINICAL DATA:  Hypotension, weakness. EXAM: ABDOMEN ULTRASOUND COMPLETE COMPARISON:  Right upper quadrant abdominal ultrasound of April 18, 2015 FINDINGS: Gallbladder: No gallstones or wall thickening visualized. No sonographic Murphy sign noted by sonographer. Common bile duct: Diameter: 4.2 mm Liver: No focal lesion identified. Within normal limits in parenchymal echogenicity. IVC: No abnormality visualized. Pancreas: Visualized portion unremarkable. Spleen: Size and appearance within normal limits. Echogenic foci error compatible with subcentimeter granulomas. Right Kidney: Length: 11.2 cm. Echogenicity within normal limits. No mass or hydronephrosis visualized. Left Kidney: Length: 12.0 cm. Echogenicity within normal limits. No mass or hydronephrosis visualized. Abdominal  aorta: No aneurysm visualized. Other findings: There is a distal abdominal aortic aneurysm measuring 3.4 cm in diameter. The proximal aorta measures 2.2 cm in diameter. The mid aorta also measures 2.2 cm in diameter. IMPRESSION: 1. No acute hepatobiliary abnormality. 2. Sub cm splenic granulomas. 3. 3.4 cm infrarenal abdominal aortic aneurysm. Recommend followup by ultrasound in 3 years. This recommendation follows ACR consensus guidelines: White Paper of the ACR Incidental Findings Committee II on Vascular Findings. Joellyn Rued Radiol 2013; [phone] Electronically Signed   By: David  Martinique M.D.   On: 12/04/2015 11:05   Dg Chest Port 1 View  Result Date: 12/04/2015 CLINICAL DATA:  Hypotension and weakness. EXAM: PORTABLE CHEST 1 VIEW COMPARISON:  01/23/2007 FINDINGS: Left lung  base is not completely imaged. Mild interstitial prominence in the lower lungs. Otherwise, the lungs are clear. Heart size is within normal limits and stable. The trachea is midline. IMPRESSION: Mild interstitial prominence in the lower lungs is nonspecific. No evidence for frank pulmonary edema or focal airspace disease. Electronically Signed   By: Markus Daft M.D.   On: 12/04/2015 09:40        Scheduled Meds: . allopurinol  100 mg Oral BID  . aspirin  81 mg Oral Daily  . atorvastatin  40 mg Oral q1800  . clopidogrel  75 mg Oral Daily  . ferrous sulfate  325 mg Oral Q breakfast  . insulin aspart  0-5 Units Subcutaneous QHS  . insulin aspart  0-9 Units Subcutaneous TID WC  . multivitamin with minerals  1 tablet Oral Daily  . omega-3 acid ethyl esters  1 g Oral BID  . vitamin B-12  1,000 mcg Oral Daily   Continuous Infusions: . sodium chloride 100 mL/hr at 12/05/15 1202     LOS: 0 days    Time spent: 75 minutes    Danay Mckellar, MD [facility] Pager [phone]  If 7PM-7AM, please contact night-coverage [email or web address] Password [email or web address] 12/05/2015, 1:37 PM

## 2015-12-05 NOTE — Progress Notes (Addendum)
Physical Therapy Evaluation Patient Details Name: Paul [name] MRN: [id number] DOB: Feb 20, 1940 Today's Date: 12/05/2015   History of Present Illness  JAIMES ECKERT is a 75 y.o. male with medical history significant for known CAD, diabetes on metformin, CAD, anemia of chronic disease.  Patient presents with generalized weakness, hypotension at PCP office and labs in ER revealed acute kidney injury and transaminitis concerning for dehydration.  Clinical Impression  Pt admitted with/for significant general weakness with AKI and transaminitis.  Pt is at a min to min guard assist level with significant use of the RW and no one to assist during the day..  Pt currently limited functionally due to the problems listed. ( See problems list.)   Pt will benefit from PT to maximize function and safety in order to get ready for next venue listed below.     Follow Up Recommendations SNF    Equipment Recommendations  None recommended by PT    Recommendations for Other Services       Precautions / Restrictions Precautions Precautions: Fall      Mobility  Bed Mobility Overal bed mobility: Modified Independent                Transfers Overall transfer level: Needs assistance   Transfers: Sit to/from Stand Sit to Stand: Min guard         General transfer comment: guard for stability  Ambulation/Gait Ambulation/Gait assistance: Min guard Ambulation Distance (Feet): 110 Feet Assistive device: Rolling walker (2 wheeled) Gait Pattern/deviations: Step-through pattern   Gait velocity interpretation: Below normal speed for age/gender General Gait Details: generally steady with moderately heavy use of RW.  pt quick to fatigue and stagger around to sit on the bed too quick for good control and safety.  Decreased knee flexion.  Stairs            Wheelchair Mobility    Modified Rankin (Stroke Patients Only)       Balance Overall balance assessment: Needs  assistance Sitting-balance support: No upper extremity supported Sitting balance-Leahy Scale: Fair     Standing balance support: Bilateral upper extremity supported Standing balance-Leahy Scale: Poor Standing balance comment: reliant on the RW                             Pertinent Vitals/Pain Pain Assessment: Faces Faces Pain Scale: No hurt    Home Living Family/patient expects to be discharged to:: Private residence Living Arrangements: Spouse/significant other Available Help at Discharge: Family Type of Home: House Home Access: Stairs to enter   Technical brewer of Steps: 3-4 Home Layout: One level Home Equipment: Environmental consultant - 2 wheels      Prior Function Level of Independence: Independent               Hand Dominance        Extremity/Trunk Assessment   Upper Extremity Assessment:  (Bil.--  grips WFL, gross flexion functional, gross ext weak)           Lower Extremity Assessment: RLE deficits/detail;LLE deficits/detail RLE Deficits / Details: R weaker than left grossly 3+ to 4-/5 with hip flexors >=3/5 LLE Deficits / Details: grossly 3+/5 hip flexors,  4-/5 quads and 3+/5 pf/df     Communication   Communication: No difficulties  Cognition Arousal/Alertness: Awake/alert Behavior During Therapy: WFL for tasks assessed/performed Overall Cognitive Status: Within Functional Limits for tasks assessed  General Comments      Exercises     Assessment/Plan    PT Assessment Patient needs continued PT services  PT Problem List Decreased strength;Decreased activity tolerance;Decreased balance;Decreased mobility;Decreased knowledge of use of DME          PT Treatment Interventions Gait training;Stair training;Therapeutic activities;Balance training;DME instruction;Patient/family education    PT Goals (Current goals can be found in the Care Plan section)  Acute Rehab PT Goals Patient Stated Goal: get back home  as soon as possible PT Goal Formulation: With patient Time For Goal Achievement: 12/12/15 Potential to Achieve Goals: Good    Frequency Min 3X/week   Barriers to discharge Decreased caregiver support wife at work days    Co-evaluation               End of Session   Activity Tolerance: Patient limited by fatigue;Patient tolerated treatment well Patient left: in bed;with call bell/phone within reach Nurse Communication: Mobility status    Functional Assessment Tool Used: clinical observation Functional Limitation: Mobility: Walking and moving around Mobility: Walking and Moving Around Current Status (C9507): At least 1 percent but less than 20 percent impaired, limited or restricted Mobility: Walking and Moving Around Goal Status [phone]): At least 1 percent but less than 20 percent impaired, limited or restricted    Time: 0518-3358 PT Time Calculation (min) (ACUTE ONLY): 21 min   Charges:   PT Evaluation $PT Eval Low Complexity: 1 Procedure     PT G Codes:   PT G-Codes **NOT FOR INPATIENT CLASS** Functional Assessment Tool Used: clinical observation Functional Limitation: Mobility: Walking and moving around Mobility: Walking and Moving Around Current Status (I5189): At least 1 percent but less than 20 percent impaired, limited or restricted Mobility: Walking and Moving Around Goal Status [phone]): At least 1 percent but less than 20 percent impaired, limited or restricted    Danya Spearman, Tessie Fass 12/05/2015, 4:41 PM 12/05/2015  Donnella Sham, PT [phone]  (pager)

## 2015-12-06 ENCOUNTER — Observation Stay (HOSPITAL_BASED_OUTPATIENT_CLINIC_OR_DEPARTMENT_OTHER): Payer: Medicare Other

## 2015-12-06 ENCOUNTER — Encounter (HOSPITAL_COMMUNITY): Payer: Self-pay | Admitting: Oncology

## 2015-12-06 DIAGNOSIS — I10 Essential (primary) hypertension: Secondary | ICD-10-CM

## 2015-12-06 DIAGNOSIS — R945 Abnormal results of liver function studies: Secondary | ICD-10-CM | POA: Diagnosis not present

## 2015-12-06 DIAGNOSIS — I824Y1 Acute embolism and thrombosis of unspecified deep veins of right proximal lower extremity: Secondary | ICD-10-CM

## 2015-12-06 DIAGNOSIS — K716 Toxic liver disease with hepatitis, not elsewhere classified: Secondary | ICD-10-CM | POA: Diagnosis not present

## 2015-12-06 DIAGNOSIS — I82411 Acute embolism and thrombosis of right femoral vein: Secondary | ICD-10-CM | POA: Diagnosis present

## 2015-12-06 DIAGNOSIS — Z8249 Family history of ischemic heart disease and other diseases of the circulatory system: Secondary | ICD-10-CM

## 2015-12-06 DIAGNOSIS — D6959 Other secondary thrombocytopenia: Secondary | ICD-10-CM | POA: Diagnosis not present

## 2015-12-06 DIAGNOSIS — E119 Type 2 diabetes mellitus without complications: Secondary | ICD-10-CM | POA: Diagnosis not present

## 2015-12-06 DIAGNOSIS — M7989 Other specified soft tissue disorders: Secondary | ICD-10-CM

## 2015-12-06 DIAGNOSIS — E785 Hyperlipidemia, unspecified: Secondary | ICD-10-CM

## 2015-12-06 DIAGNOSIS — I6523 Occlusion and stenosis of bilateral carotid arteries: Secondary | ICD-10-CM

## 2015-12-06 DIAGNOSIS — M1A09X0 Idiopathic chronic gout, multiple sites, without tophus (tophi): Secondary | ICD-10-CM

## 2015-12-06 DIAGNOSIS — Z809 Family history of malignant neoplasm, unspecified: Secondary | ICD-10-CM

## 2015-12-06 DIAGNOSIS — I2510 Atherosclerotic heart disease of native coronary artery without angina pectoris: Secondary | ICD-10-CM

## 2015-12-06 DIAGNOSIS — D696 Thrombocytopenia, unspecified: Secondary | ICD-10-CM | POA: Diagnosis not present

## 2015-12-06 DIAGNOSIS — Z87891 Personal history of nicotine dependence: Secondary | ICD-10-CM

## 2015-12-06 DIAGNOSIS — T504X5A Adverse effect of drugs affecting uric acid metabolism, initial encounter: Secondary | ICD-10-CM

## 2015-12-06 DIAGNOSIS — Z7982 Long term (current) use of aspirin: Secondary | ICD-10-CM

## 2015-12-06 DIAGNOSIS — Z7902 Long term (current) use of antithrombotics/antiplatelets: Secondary | ICD-10-CM

## 2015-12-06 DIAGNOSIS — T50905A Adverse effect of unspecified drugs, medicaments and biological substances, initial encounter: Secondary | ICD-10-CM

## 2015-12-06 DIAGNOSIS — Z955 Presence of coronary angioplasty implant and graft: Secondary | ICD-10-CM

## 2015-12-06 DIAGNOSIS — R740 Nonspecific elevation of levels of transaminase and lactic acid dehydrogenase [LDH]: Secondary | ICD-10-CM

## 2015-12-06 DIAGNOSIS — N179 Acute kidney failure, unspecified: Secondary | ICD-10-CM | POA: Diagnosis not present

## 2015-12-06 DIAGNOSIS — Z7984 Long term (current) use of oral hypoglycemic drugs: Secondary | ICD-10-CM

## 2015-12-06 DIAGNOSIS — R600 Localized edema: Secondary | ICD-10-CM | POA: Diagnosis not present

## 2015-12-06 DIAGNOSIS — Z889 Allergy status to unspecified drugs, medicaments and biological substances status: Secondary | ICD-10-CM

## 2015-12-06 DIAGNOSIS — Z79899 Other long term (current) drug therapy: Secondary | ICD-10-CM

## 2015-12-06 DIAGNOSIS — Z888 Allergy status to other drugs, medicaments and biological substances status: Secondary | ICD-10-CM

## 2015-12-06 LAB — GLUCOSE, CAPILLARY
Glucose-Capillary: 94 mg/dL (ref 65–99)
Glucose-Capillary: 151 mg/dL — ABNORMAL HIGH (ref 65–99)
Glucose-Capillary: 175 mg/dL — ABNORMAL HIGH (ref 65–99)
Glucose-Capillary: 165 mg/dL — ABNORMAL HIGH (ref 65–99)

## 2015-12-06 LAB — ECHOCARDIOGRAM COMPLETE
E decel time: 222 msec
E/e' ratio: 11
FS: 34 % (ref 28–44)
Height: 70 in
IVS/LV PW RATIO, ED: 0.92
LA ID, A-P, ES: 42 mm
LA diam end sys: 42 mm
LA diam index: 1.89 cm/m2
LA vol A4C: 82.9 ml
LA vol index: 42.6 mL/m2
LA vol: 94.5 mL
LV E/e' medial: 11
LV E/e'average: 11
LV PW d: 9.31 mm — AB (ref 0.6–1.1)
LV e' LATERAL: 7.51 cm/s
LVOT area: 3.14 cm2
LVOT diameter: 20 mm
Lateral S' vel: 16.1 cm/s
MV Dec: 222
MV Peak grad: 3 mmHg
MV pk A vel: 114 m/s
MV pk E vel: 82.6 m/s
P 1/2 time: 289 ms
RV sys press: 36 mmHg
Reg peak vel: 289 cm/s
TAPSE: 27.7 mm
TDI e' lateral: 7.51
TDI e' medial: 7.4
TR max vel: 289 cm/s
Weight: 3428.59 oz

## 2015-12-06 LAB — BASIC METABOLIC PANEL
Anion gap: 4 — ABNORMAL LOW (ref 5–15)
BUN: 23 mg/dL — ABNORMAL HIGH (ref 6–20)
CO2: 22 mmol/L (ref 22–32)
Calcium: 8.8 mg/dL — ABNORMAL LOW (ref 8.9–10.3)
Chloride: 112 mmol/L — ABNORMAL HIGH (ref 101–111)
Creatinine, Ser: 0.82 mg/dL (ref 0.61–1.24)
GFR calc Af Amer: >60 mL/min (ref >60)
GFR calc non Af Amer: >60 mL/min (ref >60)
Glucose, Bld: 80 mg/dL (ref 65–99)
Potassium: 4.2 mmol/L (ref 3.5–5.1)
Sodium: 138 mmol/L (ref 135–145)

## 2015-12-06 LAB — HEPATIC FUNCTION PANEL
ALT: 126 U/L — ABNORMAL HIGH (ref 17–63)
AST: 288 U/L — ABNORMAL HIGH (ref 15–41)
Albumin: 2.4 g/dL — ABNORMAL LOW (ref 3.5–5.0)
Alkaline Phosphatase: 65 U/L (ref 38–126)
Bilirubin, Direct: 0.2 mg/dL (ref 0.1–0.5)
Indirect Bilirubin: 0.4 mg/dL (ref 0.3–0.9)
Total Bilirubin: 0.6 mg/dL (ref 0.3–1.2)
Total Protein: 5.4 g/dL — ABNORMAL LOW (ref 6.5–8.1)

## 2015-12-06 LAB — APTT: aPTT: 32 seconds (ref 24–36)

## 2015-12-06 LAB — CBC
HCT: 22.7 % — ABNORMAL LOW (ref 39.0–52.0)
Hemoglobin: 8 g/dL — ABNORMAL LOW (ref 13.0–17.0)
MCH: 31.1 pg (ref 26.0–34.0)
MCHC: 35.2 g/dL (ref 30.0–36.0)
MCV: 88.3 fL (ref 78.0–100.0)
Platelets: 84 10*3/uL — ABNORMAL LOW (ref 150–400)
RBC: 2.57 MIL/uL — ABNORMAL LOW (ref 4.22–5.81)
RDW: 17.3 % — ABNORMAL HIGH (ref 11.5–15.5)
WBC: 6.3 10*3/uL (ref 4.0–10.5)

## 2015-12-06 LAB — PROTIME-INR
INR: 1.16
Prothrombin Time: 14.8 seconds (ref 11.4–15.2)

## 2015-12-06 LAB — LACTATE DEHYDROGENASE: LDH: 172 U/L (ref 98–192)

## 2015-12-06 LAB — BRAIN NATRIURETIC PEPTIDE: B Natriuretic Peptide: 155.6 pg/mL — ABNORMAL HIGH (ref 0.0–100.0)

## 2015-12-06 LAB — MAGNESIUM: Magnesium: 1.2 mg/dL — ABNORMAL LOW (ref 1.7–2.4)

## 2015-12-06 LAB — RETICULOCYTES
RBC.: 2.59 MIL/uL — ABNORMAL LOW (ref 4.22–5.81)
Retic Count, Absolute: 20.7 10*3/uL (ref 19.0–186.0)
Retic Ct Pct: 0.8 % (ref 0.4–3.1)

## 2015-12-06 LAB — SAVE SMEAR

## 2015-12-06 MED ORDER — MAGNESIUM SULFATE 4 GM/100ML IV SOLN
4.0000 g | Freq: Once | INTRAVENOUS | Status: AC
  Administered 2015-12-06: 4 g via INTRAVENOUS
  Filled 2015-12-06: qty 100

## 2015-12-06 MED ORDER — FONDAPARINUX SODIUM 7.5 MG/0.6ML ~~LOC~~ SOLN
7.5000 mg | Freq: Every day | SUBCUTANEOUS | Status: DC
  Administered 2015-12-06 – 2015-12-07 (×2): 7.5 mg via SUBCUTANEOUS
  Filled 2015-12-06 (×2): qty 0.6

## 2015-12-06 NOTE — Progress Notes (Signed)
PROGRESS NOTE    Paul [name]  HWE:[id number] DOB: [birth year] DOA: 12/04/2015 PCP: Wyatt Haste, MD    Brief Narrative:  Patient is 75 year old gentleman who presented with profound hypotension felt secondary to dehydration resulting acute kidney injury and mild transaminitis. Lower extremity Dopplers ordered to rule out DVT. Patient placed on IV fluids. Patient with significant shortness of breath on exertion assessed CT angiogram was done which was negative for PE however concerning for volume overload in the bases. No extremity Dopplers are positive for DVT however patient noted to be thrombocytopenic and a such hematology consultation was obtained.   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 Patient has been hydrated with IV fluids with improvement in renal function. Patient noted to be significantly short of breath on minimal ambulation per nursing as such CT angiogram of the chest was done which was negative for PE however was worrisome for probable volume overload. Patient is auto diuresis with a urine output of 2.425 L over the past 24 hours. Patient is -2.080 L during the  hospitalization. Monitor renal function.  #2 orthostatic hypotension Improved with hydration.  #3 shortness of breath Patient noted to be significantly short of breath on ambulation yesterday 12/06/2015. Patient noted to lower extremity DVT by Dopplers. CT angiogram of chest was negative for PE, however did show some groundglass opacities particularly in the bases with interstitial thickening favoring edema. Nodularity of liver contour suggesting cirrhosis. 2-D echo with a EF of 60-65% with no wall motion abnormalities, grade 1 diastolic dysfunction, increased right ventricular systolic pressure consistent with mild pulmonary hypertension. IV fluids have been saline locked. Patient with a urine output of 2.425 L over the past 24 hours and patient is -2 L during this hospitalization. pending. Monitor closely as patient had presented with orthostasis.   #4 right lower extremity edema secondary to right lower extremity DVT Patient noted to have a right lower extremity DVT per Doppler ultrasound. Patient with a thrombocytopenia and a such unable to place on heparin. Case has been discussed with hematology who had recommended Arixtra as an effective and safe short-term anticoagulation. Hematology consulting and recommended that once platelets have recovered will place patient on on oral anticoagulant.  #5 type 2 diabetes mellitus Hemoglobin A1c 6.3. CBGs have ranged from 94-175. Continue sliding scale insulin.  #6 history of gout Allopurinol has been discontinued per Hematology.   #7 coronary artery disease Currently stable. 2-D echo with EF of 60-65%, no wall motion abnormalities, grade 1 diastolic dysfunction, right ventricular systolic pressure increased consistent with mild pulmonary hypertension. pending. Continue aspirin, Lipitor, Lovaza. Follow.  #8 thrombocytopenia Felt to be secondary to allopurinol per hematology. Intermittently reoccurring felt secondary to recent UTI/infection. Plavix on  hold. Patient with no bleeding. Unknown etiology. Allopurinol has been discontinued per hematology. Will check LDH, haptoglobin, hepatic panel, peripheral smear, ANA. Consulted with hematology.  #9 recent UTI Status post Levaquin which was sensitive to Escherichia coli. Repeat UA negative.    DVT prophylaxis: SCDs. Code Status: Full Family Communication: Updated patient. No family at bedside. Disposition Plan: Likely home with home health versus skilled nursing facility when medically stable.   Consultants:   Hematology: Dr. Granfortuna11/05/2015  Procedures:   CT angiogram 12/05/2015  2-D echo 12/05/2015  Chest x-ray 12/04/2015  Abdominal US 12/04/2015  Lower extremity Dopplers 12/06/2015  Antimicrobials:   None   Subjective: Patient sitting up at bedside. Patient states some improvement with shortness of breath. Patient denies any chest pain. Patient excited about meeting hematologist today.   Objective: Vitals:   12/06/15 0200 12/06/15 0609 12/06/15 0936 12/06/15 1728  BP: (!) 125/54 (!) 134/50 (!) 117/45 (!) 122/50  Pulse: 87 71 70 67  Resp: 19 18 18 18   Temp: 98.4 F (36.9 C) 98 F (36.7 C) 98.6 F (37 C) 98.4 F (36.9 C)  TempSrc: Oral Oral Oral Oral  SpO2: 99% 98% 96% 97%  Weight: 97.3 kg (214 lb 8.1 oz)     Height:        Intake/Output Summary (Last 24 hours) at 12/06/15 1906 Last data filed at 12/06/15 1300  Gross per 24 hour  Intake             1145 ml  Output             1575 ml  Net             -430 ml   Filed Weights   12/04/15 2057 12/06/15 0200  Weight: 97.2 kg (214 lb 4.6 oz) 97.3 kg (214 lb 8.1 oz)    Examination:  General exam: Appears calm and comfortable  Respiratory system: Some bibasilar crackles noted. Respiratory effort normal. Cardiovascular system: S1 & S2 heard, RRR. No JVD, murmurs, rubs, gallops or clicks. No pedal edema. Gastrointestinal system: Abdomen is nondistended, soft and nontender. No organomegaly or masses  felt. Normal bowel sounds heard. Central nervous system: Alert and oriented. No focal neurological deficits. Extremities: Right lower extremity with some slight swelling however nontender to palpation, not tight. Skin: No rashes, lesions or ulcers Psychiatry: Judgement and insight appear normal. Mood & affect appropriate.     Data Reviewed: I have personally reviewed following labs and imaging studies  CBC:  Recent Labs Lab 12/04/15 0920 12/05/15 0317 12/06/15 0631  WBC 8.8 5.8 6.3  NEUTROABS 6.7  --   --   HGB 9.1* 8.2* 8.0*  HCT 26.1* 23.4* 22.7*  MCV 87.9 89.0 88.3  PLT 83* 71* 84*   Basic Metabolic Panel:  Recent Labs Lab 12/04/15 0920 12/05/15 0317 12/06/15 0631  NA 137 139 138  K 5.1 4.6 4.2  CL 110 115* 112*  CO2 19* 21* 22  GLUCOSE 95 100* 80  BUN 43* 35* 23*  CREATININE 1.84* 1.26* 0.82  CALCIUM 9.1 8.7* 8.8*  MG  --   --  1.2*   GFR: Estimated Creatinine Clearance: 93.8 mL/min (by C-G formula based on SCr of 0.82 mg/dL). Liver Function Tests:  Recent Labs Lab 12/04/15 0920 12/05/15 0317 12/06/15 0950  AST 246* 332* 288*  ALT 112* 124* 126*  ALKPHOS 79 85 65  BILITOT 0.8 0.7 0.6  PROT 6.3* 5.4* 5.4*  ALBUMIN 2.7* 2.2* 2.4*   No results for input(s):  LIPASE, AMYLASE in the last 168 hours. No results for input(s): AMMONIA in the last 168 hours. Coagulation Profile:  Recent Labs Lab 12/06/15 0950  INR 1.16   Cardiac Enzymes: No results for input(s): CKTOTAL, CKMB, CKMBINDEX, TROPONINI in the last 168 hours. BNP (last 3 results) No results for input(s): PROBNP in the last 8760 hours. HbA1C:  Recent Labs  12/04/15 1131  HGBA1C 6.3*   CBG:  Recent Labs Lab 12/05/15 1612 12/05/15 2101 12/06/15 0745 12/06/15 1204 12/06/15 1724  GLUCAP 123* 128* 94 151* 175*   Lipid Profile: No results for input(s): CHOL, HDL, LDLCALC, TRIG, CHOLHDL, LDLDIRECT in the last 72 hours. Thyroid Function Tests: No results for input(s): TSH, T4TOTAL,  FREET4, T3FREE, THYROIDAB in the last 72 hours. Anemia Panel:  Recent Labs  12/06/15 0950  RETICCTPCT 0.8   Sepsis Labs:  Recent Labs Lab 12/04/15 4656  LATICACIDVEN 1.83    Recent Results (from the past 240 hour(s))  Urine culture     Status: None   Collection Time: 12/04/15 11:37 AM  Result Value Ref Range Status   Specimen Description URINE, CATHETERIZED  Final   Special Requests NONE  Final   Culture NO GROWTH  Final   Report Status 12/05/2015 FINAL  Final         Radiology Studies: Ct Angio Chest Pe W Or Wo Contrast  Result Date: 12/05/2015 CLINICAL DATA:  Shortness of breath. EXAM: CT ANGIOGRAPHY CHEST WITH CONTRAST TECHNIQUE: Multidetector CT imaging of the chest was performed using the standard protocol during bolus administration of intravenous contrast. Multiplanar CT image reconstructions and MIPs were obtained to evaluate the vascular anatomy. CONTRAST:  100 mL of Isovue 370 COMPARISON:  Chest x-ray from yesterday FINDINGS: Cardiovascular: Cardiomegaly is noted. No pleural or pericardial effusions. Coronary artery calcifications are identified. The thoracic aorta is atherosclerotic but non aneurysmal with no dissection. No pulmonary emboli identified. Mediastinum/Nodes: Shotty nodes in the mediastinum are likely reactive. No uncalcified adenopathy identified. Lungs/Pleura: No pleural abnormalities are identified. A calcified granuloma is seen in the superior segment of the right lower lobe. No suspicious pulmonary nodules or masses. No suspicious focal infiltrates. Ground-glass opacities are identified with associated interstitial thickening, particularly in the bases but involving all lobes. Upper Abdomen: Mildly nodular liver contour. Granulomas in the splenic spleen. No other abnormalities in the upper abdomen. Choose Musculoskeletal: No chest wall abnormality. No acute or significant osseous findings. Review of the MIP images confirms the above findings. IMPRESSION:  1. No pulmonary emboli. 2. Increased ground-glass opacities, particularly in the bases, with interstitial thickening. These findings are favored to represent edema. 3. 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 Recommend clinical correlation. Electronically Signed   By: Dorise Bullion III M.D   On: 12/05/2015 14:41        Scheduled Meds: . aspirin  81 mg Oral Daily  . ferrous sulfate  325 mg Oral Q breakfast  . fondaparinux (ARIXTRA) injection  7.5 mg Subcutaneous Q0600  . insulin aspart  0-5 Units Subcutaneous QHS  . insulin aspart  0-9 Units Subcutaneous TID WC  . multivitamin with minerals  1 tablet Oral Daily  . omega-3 acid ethyl esters  1 g Oral BID  . vitamin B-12  1,000 mcg Oral Daily   Continuous Infusions:     LOS: 0 days    Time spent: 33 minutes    Marquise Wicke, MD [facility] Pager [phone]  If 7PM-7AM, please contact night-coverage [email or web address] Password [email or web address] 12/06/2015, 7:06 PM

## 2015-12-06 NOTE — Clinical Social Work Note (Signed)
Clinical Social Work Assessment  Patient Details  Name: Paul [name] MRN: [id number] Date of Birth: 1940-03-18  Date of referral:  12/06/15               Reason for consult:  Discharge Planning                Permission sought to share information with:  Family Supports Permission granted to share information::  Yes, Verbal Permission Granted  Name::     Secretary/administrator::     Relationship::  Spouse  Contact Information:     Housing/Transportation Living arrangements for the past 2 months:  Single Family Home Source of Information:  Patient Patient Interpreter Needed:  None Criminal Activity/Legal Involvement Pertinent to Current Situation/Hospitalization:  No - Comment as needed Significant Relationships:  Spouse Lives with:  Spouse Do you feel safe going back to the place where you live?  Yes Need for family participation in patient care:  No (Coment)  Care giving concerns:  PT recommends SNF placement.   Social Worker assessment / plan:  CSW met with pt at bedside. Pt was laying in bed, alert and oriented, and agreeable to assessment.  Pt currently lives at home with wife. Pt stated that he has gout flare-ups which prohibit him from ambulating and he experienced a fall prior to hospitalization. Pt stated that he was hospitalized about 6 months ago and went to SNF [facility]) for 19 days to rehab. Pt stated he would prefer to go home at [place] because he feels that he understands what he needs to do but understands PT recommendation for SNF to ensure his safety.  Pt was agreeable for CSW to complete [facility] search for SNF beds. Pt stated that he had a pleasant experience at [facility] and would consider to return there if needed. Pt will speak with family and make a decision of HH vs SNF closer to [place].  FL2 was completed and faxed out. CSW to follow up with bed offers.   Employment status:  Retired Nurse, adult PT Recommendations:   [place] / Referral to community resources:  [place]  Patient/Family's Response to care:  Pt was accepting and appropriate during assessment.  Patient/Family's Understanding of and Emotional Response to Diagnosis, Current Treatment, and Prognosis:  Pt prefers to return home but understanding that he might need rehab to ensure his safety.  Emotional Assessment Appearance:  Appears stated age, Well-Groomed Attitude/Demeanor/Rapport:  Other (Appropriate) Affect (typically observed):  Accepting, Appropriate, Pleasant Orientation:  Oriented to Self, Oriented to Place, Oriented to  Time, Oriented to Situation Alcohol / Substance use:  Not Applicable Psych involvement (Current and /or in the community):  No (Comment)  Discharge Needs  Concerns to be addressed:  No discharge needs identified Readmission within the last 30 days:  No Current discharge risk:  None Barriers to Discharge:  No Barriers Identified   Boone Master, [place] 12/06/2015, 9:22 AM Weekend Coverage

## 2015-12-06 NOTE — Consult Note (Signed)
Referring MD: Dr. Irine Seal  PCP:  Wyatt Haste, MD   Reason for Referral: Acute right lower extremity DVT in a man with a moderate thrombocytopenia   Chief Complaint  Patient presents with  . Weakness    HPI:  75 year old man with known coronary artery disease status post right coronary stent, subsequent stent occlusion in December, 2013. type 2 diabetes on oral agents, bilateral carotid artery disease, essential hypertension, and hyperlipidemia. He has long-standing gout for over 20 years. Gout affects multiple joints including ankles and wrists. He has been on chronic allopurinol and colchicine. He was admitted here in March 2017 on [facility]. Symptoms were nonspecific weakness, orthostatic dizziness, diarrhea, and dehydration. Platelet count 88,000 at that time with a count just 11 days prior to admission of 200,000. He had transaminase elevations. Acute hepatitis profile negative for hepatitis A, B, or C. Despite living in the [place] for many years, he never contracted hepatitis, yellow jaundice, or malaria. He used to drink whiskey to a moderate degree but has stopped all alcohol for the last 3 years. Previous blood counts as far back as August 2013 show intermittent decrease in his platelet count at 139,000. In August 2014: 116,000, in December 2015: 125,000. During the March admission, platelet count initially 88,000, fell to 51,000, rose 233,000 by discharge. Platelet count as recently as 07/22/2015 was 200,000. At time of current admission platelet count was 83,000. Hemoglobin 9, hematocrit 26, white count 8800 with 76 neutrophils, 9 lymphocytes, 3 monocytes, 1 eosinophil. Reticulocyte count 0.8%. Bilirubin 0.8. SGOT 246, SGPT 112, alkaline phosphatase 43, LDH 172.  Transaminases were normal as recently as 06/11/2015 which argues against chronic liver disease.  He presented at this time with identical symptoms of nonspecific weakness, hypotension, dyspnea, and a  flareup of his gout with right ankle and leg swelling. At time of the March admission, he was found to be B12 deficient and put on oral B12 replacement. An allergic reaction to either colchicine or allopurinol was suspected and he was advised to stop these medications at time of discharge. A few weeks later, both medications were resumed by his primary care physician. The dose of allopurinol has been titrated. The patient feels that he does not tolerate the allopurinol but it is different occult to sort out side effects from the allopurinol as opposed to symptoms from gout flares. He is on multiple additional medications including aspirin, Plavix, diclofenac, Isordil, simvastatin, metformin, fish oil, B12, multivitamins, iron, and until a few weeks ago he was on quinapril. Diclofenac is a recent addition. Due to dyspnea as part of his presenting symptoms, a CT angiogram of the chest was done on admission November 3 which did not show any pulmonary emboli. However the liver had a mildly nodular contour, there was evidence for granulomatous disease with a calcified granuloma in the right lower lobe and granuloma seen in the spleen. Due to his complaints of right leg swelling, a venous Doppler study was done which shows acute DVT involving the right femoral vein. No prior history of blood clots.    Past Medical History:  Diagnosis Date  . AAA (abdominal aortic aneurysm) ([place])   . Abnormal LFTs   . AKI (acute kidney injury) ([place])   . Anemia of chronic disease   . Arthritis    "all over" (12/04/2015)  . Atherosclerosis of native coronary artery of native heart without angina pectoris   . CAD (coronary artery disease)    with cath 09/26/08 showing severe heart  disease in RCA with POBA, mild to mod diesease in circ and LAD  . Chronic back pain    back problems prior to surgery   . Constipation   . Coronary atherosclerosis of native coronary artery   . Dehydration   . Diabetes mellitus    with  neuropathy  . Diarrhea   . Elevated liver enzymes   . Generalized weakness   . Gout of right hand   . Hiatal hernia   . History of gastroesophageal reflux (GERD)   . History of stomach ulcers   . Hyperlipidemia   . Hypertension   . Metabolic acidosis   . Thrombocytopenia ([place])   . Vitamin B12 deficiency   . Vitamin B12 deficiency   . Vitamin D deficiency   . Weakness   :  Past Surgical History:  Procedure Laterality Date  . ANKLE FRACTURE SURGERY Left    "crushed"  . BACK SURGERY  X 3   "fell 4 stories in 1977"  . CARPAL TUNNEL RELEASE Left   . CORONARY ANGIOPLASTY WITH STENT PLACEMENT  09/26/2008   [name] 06/03/2010  . FRACTURE SURGERY: Crush injury to the left ankle work related     . LEFT HEART CATHETERIZATION WITH CORONARY ANGIOGRAM N/A 01/21/2012   Procedure: LEFT HEART CATHETERIZATION WITH CORONARY ANGIOGRAM;  Surgeon: Burnell Blanks, MD;  Location[facility] C[facility] LAB;  Service: Cardiovascular;  Laterality: N/A;  . LUMBAR [place]; ~ 1980  . POSTERIOR LUMBAR FUSION  ~ 1998   "3rd OR to reconstruct my back after fall in 1977"  . 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 and 2nd  :  . allopurinol  100 mg Oral BID  . aspirin  81 mg Oral Daily  . clopidogrel  75 mg Oral Daily  . ferrous sulfate  325 mg Oral Q breakfast  . fondaparinux (ARIXTRA) injection  7.5 mg Subcutaneous Q0600  . insulin aspart  0-5 Units Subcutaneous QHS  . insulin aspart  0-9 Units Subcutaneous TID WC  . multivitamin with minerals  1 tablet Oral Daily  . omega-3 acid ethyl esters  1 g Oral BID  . vitamin B-12  1,000 mcg Oral Daily  :  No Known Allergies:  Family History  Problem Relation Age of Onset  . Heart attack Mother   . Cancer Mother   . Heart attack Sister   :He has a sister who is healthy and accompanies him today. Another sister healthy living in [place] and a healthy brother in [place].   Social History   Social History  . Marital status: Married    Spouse name:   . Number  of children: No children   . Years of education: N/A   Occupational History   He worked as a Theme park manager for many years. Until he fell off a roof and got injured.    Social History Main Topics  . Smoking status: Former Smoker    Years: 20.00    Types: Cigars    Quit date: 01/24/2013  . Smokeless tobacco: Never Used  . Alcohol use Yes     Comment: 12/04/2015 "nothing in 3-4 years"  . Drug use: No  . Sexual activity: Yes   Other Topics Concern  . Not on file   Social History Narrative   Divorced, 1 child.    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    Owned roofing co. In [place]  :  ROS: Eyes:No change in vision Throat:  No sore throat Neck: Resp:  Dyspnea with exertion Cardio: No chest pain or palpitations GI: No abdominal pain or change in bowel habit. No hematochezia or melena. Extremities: Recurrent swelling of his joints and soft tissues. 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 No rash or ecchymosis Genitourinary: No hematuria   Vitals: Vitals:   12/06/15 0609 12/06/15 0936  BP: (!) 134/50 (!) 117/45  Pulse: 71 70  Resp: 18 18  Temp: 98 F (36.7 C) 98.6 F (37 C)    PHYSICAL EXAM: General appearance:Well nourished Caucasian man HEENT: Pharynx no erythema exudate or ulcer. Neck full range of motion Lymph Nodes: No cervical, supraclavicular, or axillary adenopathy Resp: Lungs overall clear to auscultation and resonant to percussion Cardio: Regular rhythm no murmur gallop or rub Vascular: Carotids 2+ no bruits. Dorsalis pedis pulses not palpable Breasts: GI: Abdomen soft, obese, nontender, question liver edge palpable right subcostal margin GU: Extremities: Calves are supple and nontender. Minimal to no swelling right calf versus left. Amputation medial 2 toes right foot  Neurologic: Alert and oriented 3, motor strength 5 over 5, reflexes absent  symmetric at the knees, 1+ symmetric at the biceps, cranial nerves grossly normal, full extraocular movements, upper body coordination normal Skin: Palmar erythema. No spider angiomas. Sallow but anicteric.  Labs:   Recent Labs  12/05/15 0317 12/06/15 0631  WBC 5.8 6.3  HGB 8.2* 8.0*  HCT 23.4* 22.7*  PLT 71* 84*    Recent Labs  12/05/15 0317 12/06/15 0631  NA 139 138  K 4.6 4.2  CL 115* 112*  CO2 21* 22  GLUCOSE 100* 80  BUN 35* 23*  CREATININE 1.26* 0.82  CALCIUM 8.7* 8.8*    Blood smear review: Normochromic normocytic red cells. 1+ anisopoikilocytosis. Some abnormal red cell shapes. Rare schistocytes less than one per 10 high power fields. Platelets variably decreased 5-10 per high-power field estimate from smear: 75,000-150,000. Some large platelets which will spuriously lower the machine count. neutrophils and lymphocytes appear mature. No eosinophils.  Images Studies/Results:  Ct Angio Chest Pe W Or Wo Contrast  Result Date: 12/05/2015 CLINICAL DATA:  Shortness of breath. EXAM: CT ANGIOGRAPHY CHEST WITH CONTRAST TECHNIQUE: Multidetector CT imaging of the chest was performed using the standard protocol during bolus administration of intravenous contrast. Multiplanar CT image reconstructions and MIPs were obtained to evaluate the vascular anatomy. CONTRAST:  100 mL of Isovue 370 COMPARISON:  Chest x-ray from yesterday FINDINGS: Cardiovascular: Cardiomegaly is noted. No pleural or pericardial effusions. Coronary artery calcifications are identified. The thoracic aorta is atherosclerotic but non aneurysmal with no dissection. No pulmonary emboli identified. Mediastinum/Nodes: Shotty nodes in the mediastinum are likely reactive. No uncalcified adenopathy identified. Lungs/Pleura: No pleural abnormalities are identified. A calcified granuloma is seen in the superior segment of the right lower lobe. No suspicious pulmonary nodules or masses. No suspicious focal infiltrates.  Ground-glass opacities are identified with associated interstitial thickening, particularly in the bases but involving all lobes. Upper Abdomen: Mildly nodular liver contour. Granulomas in the splenic spleen. No other abnormalities in the upper abdomen. Choose Musculoskeletal: No chest wall abnormality. No acute or significant osseous findings. Review of the MIP images confirms the above findings. IMPRESSION: 1. No pulmonary emboli. 2. Increased ground-glass opacities, particularly in the bases, with interstitial thickening. These findings are favored to represent edema. 3. 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 Recommend clinical correlation. Electronically Signed   By: Shanon Brow  Jimmye Norman III M.D   On: 12/05/2015 14:41       Assessment: Principal Problem:   Acute kidney injury [facility]) Active Problems:   Coronary atherosclerosis of native coronary artery   Gout   Controlled type 2 diabetes mellitus without complication, without long-term current use of insulin ([name])   Thrombocytopenia ([name])   Anemia of chronic disease   AAA (abdominal aortic aneurysm) ([name])-infrarenal 3.4 cm   Recent UTI (urinary tract infection)   Transaminitis   Edema of right lower extremity   Orthostatic hypotension   DVT, lower extremity, proximal, acute, right ([name])   Hypomagnesemia  Impression: I believe we are seeing an allergic reaction to allopurinol to explain transient thrombocytopenia and transient liver function abnormalities. This is the second occurrence with an identical presentation in March of this year.   Recommendation: I would permanently discontinue allopurinol and mark it  as an allergy. Although there is a rare schistocytes seen on review of the peripheral blood film, overall his history and other supportive laboratory studies do not demonstrate a microangiopathic hemolytic picture. (Normal LDH, normal bilirubin, normal reticulocyte count, recent platelet count as high as 200,000,  fluctuating platelet counts as low as 88,000 during the March admission.).  With respect to anticoagulation, as we discussed by phone, fondaparinux (Arixtra) is safe and effective for short-term anticoagulation. As soon as his platelet count begins to recover I would put him on an oral anticoagulant of your choice. Duration of anticoagulation unclear. One could consider this a provoked event since it was associated with a gout attack although in my experience, I have not seen DVTs in association with gouty attacks. I discussed with the patient that short interval reevaluation after 3 months of anticoagulation should be done and I will be happy to follow him in my clinic.  Since he was on aspirin, Plavix, and diclofenac, and is now going to go on a full dose anticoagulant, I'm going to stop the Plavix and the diclofenac. Continue aspirin. He is on the dual antiplatelet agents in view of a failed stent. I think aspirin alone plus and anticoagulant will suffice.  Thank you for this consultation  Murriel Hopper, MD, [place]  Hematology-Oncology/Internal Medicine  12/06/2015, 4:49 PM

## 2015-12-06 NOTE — NC FL2 (Signed)
[place] LEVEL OF CARE SCREENING TOOL     IDENTIFICATION  Patient Name: Paul [name] Birthdate: [birth year] Sex: male Admission Date (Current Location): 12/04/2015  [facility] and [place] Number:  Herbalist and Address:  [facility], [place] [address] [place], [place] [address]      Provider Number: [id number]  Attending Physician Name and Address:  Eugenie Filler, MD  Relative Name and Phone Number:       Current Level of Care: Hospital Recommended Level of Care: [place] Prior Approval Number:    Date Approved/Denied:   PASRR Number: [id number]  Discharge Plan: SNF    Current Diagnoses: Patient Active Problem List   Diagnosis Date Noted  . DVT, lower extremity, proximal, acute, right ([place]) 12/06/2015  . Hypomagnesemia 12/06/2015  . Acute kidney injury ([place]) 12/04/2015  . AAA (abdominal aortic aneurysm) ([name])-infrarenal 3.4 cm 12/04/2015  . Recent UTI (urinary tract infection) 12/04/2015  . Transaminitis 12/04/2015  . Edema of right lower extremity 12/04/2015  . Orthostatic hypotension 12/04/2015  . Vitamin B12 deficiency 04/22/2015  . Controlled type 2 diabetes mellitus without complication, without long-term current use of insulin ([place]) 04/18/2015  . Thrombocytopenia ([place]) 04/18/2015  . Anemia of chronic disease 04/18/2015  . Gout 07/22/2010  . Coronary atherosclerosis of native coronary artery 09/17/2008    Orientation RESPIRATION BLADDER Height & Weight     Self, Time, Situation, Place  Normal Continent Weight: 214 lb 8.1 oz (97.3 kg) Height:  5\' 10"  (177.8 cm)  BEHAVIORAL SYMPTOMS/MOOD NEUROLOGICAL BOWEL NUTRITION STATUS      Continent Diet (Heart healthy/carb modified)  AMBULATORY STATUS COMMUNICATION OF NEEDS Skin   Limited Assist Verbally Normal                       Personal Care Assistance Level of Assistance  Bathing, Dressing Bathing Assistance: Limited assistance    Dressing Assistance: Limited assistance     Functional Limitations Info  Sight, Hearing, Speech Sight Info: Adequate Hearing Info: Adequate Speech Info: Adequate    SPECIAL CARE FACTORS FREQUENCY  PT (By licensed PT), OT (By licensed OT)                    Contractures Contractures Info: Not present    Additional Factors Info  Code Status, Allergies, Insulin Sliding Scale Code Status Info: Full Allergies Info: NKDA           Current Medications (12/06/2015):  This is the current hospital active medication list Current Facility-Administered Medications  Medication Dose Route Frequency Provider Last Rate Last Dose  . acetaminophen (TYLENOL) tablet 650 mg  650 mg Oral Q6H PRN Samella Parr, NP       Or  . acetaminophen (TYLENOL) suppository 650 mg  650 mg Rectal Q6H PRN Samella Parr, NP      . allopurinol (ZYLOPRIM) tablet 100 mg  100 mg Oral BID Samella Parr, NP   100 mg at 12/05/15 2211  . aspirin chewable tablet 81 mg  81 mg Oral Daily Samella Parr, NP   81 mg at 12/05/15 0949  . atorvastatin (LIPITOR) tablet 40 mg  40 mg Oral q1800 Samella Parr, NP   40 mg at 12/05/15 1653  . clopidogrel (PLAVIX) tablet 75 mg  75 mg Oral Daily Samella Parr, NP   75 mg at 12/05/15 0949  . ferrous sulfate tablet 325 mg  325 mg  Oral Q breakfast Samella Parr, NP   325 mg at 12/05/15 0349  . fondaparinux (ARIXTRA) injection 7.5 mg  7.5 mg Subcutaneous Q0600 Eugenie Filler, MD      . insulin aspart (novoLOG) injection 0-5 Units  0-5 Units Subcutaneous QHS Samella Parr, NP      . insulin aspart (novoLOG) injection 0-9 Units  0-9 Units Subcutaneous TID WC Samella Parr, NP   1 Units at 12/05/15 1654  . magnesium sulfate IVPB 4 g 100 mL  4 g Intravenous Once Eugenie Filler, MD      . multivitamin with minerals tablet 1 tablet  1 tablet Oral Daily Samella Parr, NP   1 tablet at 12/05/15 0949  . omega-3 acid ethyl esters (LOVAZA) capsule 1 g  1 g Oral BID  Samella Parr, NP   1 g at 12/05/15 2211  . vitamin B-12 (CYANOCOBALAMIN) tablet 1,000 mcg  1,000 mcg Oral Daily Samella Parr, NP   1,000 mcg at 12/05/15 1791     Discharge Medications: Please see discharge summary for a list of discharge medications.  Relevant Imaging Results:  Relevant Lab Results:   Additional Information    Boone Master, LCSW

## 2015-12-06 NOTE — Clinical Social Work Placement (Signed)
   CLINICAL SOCIAL WORK PLACEMENT  NOTE  Date:  12/06/2015  Patient Details  Name: Paul [name] MRN: [id number] Date of Birth: 09-27-1940  Clinical Social Work is seeking post-discharge placement for this patient at the [place] level of care (*CSW will initial, date and re-position this form in  chart as items are completed):  Yes   Patient/family provided with [place] Work Department's list of facilities offering this level of care within the geographic area requested by the patient (or if unable, by the patient's family).  Yes   Patient/family informed of their freedom to choose among providers that offer the needed level of care, that participate in Medicare, Medicaid or managed care program needed by the patient, have an available bed and are willing to accept the patient.  Yes   Patient/family informed of [place]'s ownership interest in [facility] and [facility], as well as of the fact that they are under no obligation to receive care at these facilities.  PASRR submitted to EDS on       PASRR number received on       Existing PASRR number confirmed on 12/06/15     FL2 transmitted to all facilities in geographic area requested by pt/family on 12/06/15     FL2 transmitted to all facilities within larger geographic area on       Patient informed that his/her managed care company has contracts with or will negotiate with certain facilities, including the following:            Patient/family informed of bed offers received.  Patient chooses bed at       Physician recommends and patient chooses bed at      Patient to be transferred to   on  .  Patient to be transferred to facility by       Patient family notified on   of transfer.  Name of family member notified:        PHYSICIAN       Additional Comment:    _______________________________________________ Boone Master, Eleanor 12/06/2015, 9:21 AM

## 2015-12-06 NOTE — Progress Notes (Signed)
*  Preliminary Results* Bilateral lower extremity venous duplex completed. The right lower extremity is positive for acute deep vein thrombosis involving the right femoral vein in the mid and distal segments. The left lower extremity is negative for deep vein thrombosis. There is no evidence of Baker's cyst bilaterally.  Preliminary results discussed with Dr. Grandville Silos.  12/06/2015 8:33 AM Maudry Mayhew, BS, RVT, RDCS, RDMS

## 2015-12-07 DIAGNOSIS — K716 Toxic liver disease with hepatitis, not elsewhere classified: Secondary | ICD-10-CM | POA: Diagnosis not present

## 2015-12-07 DIAGNOSIS — D6959 Other secondary thrombocytopenia: Secondary | ICD-10-CM

## 2015-12-07 DIAGNOSIS — T504X5A Adverse effect of drugs affecting uric acid metabolism, initial encounter: Secondary | ICD-10-CM | POA: Diagnosis not present

## 2015-12-07 DIAGNOSIS — T50905A Adverse effect of unspecified drugs, medicaments and biological substances, initial encounter: Secondary | ICD-10-CM

## 2015-12-07 DIAGNOSIS — I82411 Acute embolism and thrombosis of right femoral vein: Secondary | ICD-10-CM | POA: Diagnosis not present

## 2015-12-07 DIAGNOSIS — E119 Type 2 diabetes mellitus without complications: Secondary | ICD-10-CM | POA: Diagnosis not present

## 2015-12-07 DIAGNOSIS — I824Y1 Acute embolism and thrombosis of unspecified deep veins of right proximal lower extremity: Secondary | ICD-10-CM | POA: Diagnosis not present

## 2015-12-07 DIAGNOSIS — R600 Localized edema: Secondary | ICD-10-CM | POA: Diagnosis not present

## 2015-12-07 DIAGNOSIS — R945 Abnormal results of liver function studies: Secondary | ICD-10-CM | POA: Diagnosis not present

## 2015-12-07 DIAGNOSIS — N179 Acute kidney failure, unspecified: Secondary | ICD-10-CM | POA: Diagnosis not present

## 2015-12-07 LAB — CBC
HCT: 22.2 % — ABNORMAL LOW (ref 39.0–52.0)
Hemoglobin: 8 g/dL — ABNORMAL LOW (ref 13.0–17.0)
MCH: 31.7 pg (ref 26.0–34.0)
MCHC: 36 g/dL (ref 30.0–36.0)
MCV: 88.1 fL (ref 78.0–100.0)
Platelets: 97 10*3/uL — ABNORMAL LOW (ref 150–400)
RBC: 2.52 MIL/uL — ABNORMAL LOW (ref 4.22–5.81)
RDW: 17.2 % — ABNORMAL HIGH (ref 11.5–15.5)
WBC: 5.7 10*3/uL (ref 4.0–10.5)

## 2015-12-07 LAB — COMPREHENSIVE METABOLIC PANEL
ALT: 153 U/L — ABNORMAL HIGH (ref 17–63)
AST: 244 U/L — ABNORMAL HIGH (ref 15–41)
Albumin: 2.3 g/dL — ABNORMAL LOW (ref 3.5–5.0)
Alkaline Phosphatase: 79 U/L (ref 38–126)
Anion gap: 3 — ABNORMAL LOW (ref 5–15)
BUN: 25 mg/dL — ABNORMAL HIGH (ref 6–20)
CO2: 23 mmol/L (ref 22–32)
Calcium: 8.9 mg/dL (ref 8.9–10.3)
Chloride: 109 mmol/L (ref 101–111)
Creatinine, Ser: 1.01 mg/dL (ref 0.61–1.24)
GFR calc Af Amer: >60 mL/min (ref >60)
GFR calc non Af Amer: >60 mL/min (ref >60)
Glucose, Bld: 130 mg/dL — ABNORMAL HIGH (ref 65–99)
Potassium: 4.4 mmol/L (ref 3.5–5.1)
Sodium: 135 mmol/L (ref 135–145)
Total Bilirubin: 0.5 mg/dL (ref 0.3–1.2)
Total Protein: 5.7 g/dL — ABNORMAL LOW (ref 6.5–8.1)

## 2015-12-07 LAB — GLUCOSE, CAPILLARY
Glucose-Capillary: 105 mg/dL — ABNORMAL HIGH (ref 65–99)
Glucose-Capillary: 141 mg/dL — ABNORMAL HIGH (ref 65–99)
Glucose-Capillary: 173 mg/dL — ABNORMAL HIGH (ref 65–99)
Glucose-Capillary: 181 mg/dL — ABNORMAL HIGH (ref 65–99)

## 2015-12-07 LAB — HAPTOGLOBIN: Haptoglobin: 151 mg/dL (ref 34–200)

## 2015-12-07 LAB — MAGNESIUM: Magnesium: 1.8 mg/dL (ref 1.7–2.4)

## 2015-12-07 MED ORDER — RIVAROXABAN 15 MG PO TABS
15.0000 mg | ORAL_TABLET | Freq: Two times a day (BID) | ORAL | Status: DC
  Administered 2015-12-08: 15 mg via ORAL
  Filled 2015-12-07: qty 1

## 2015-12-07 MED ORDER — RIVAROXABAN 20 MG PO TABS: 20.0000 mg | ORAL_TABLET | Freq: Every day | ORAL | Status: DC

## 2015-12-07 NOTE — Evaluation (Addendum)
Occupational Therapy Evaluation Patient Details Name: Paul [name] MRN: [id number] DOB: [birth year] Today's Date: 12/07/2015    History of Present Illness Paul [name] is a 75 y.o. male with medical history significant for known CAD, diabetes on metformin, CAD, anemia of chronic disease.  Pt presented with profound hypotension felt secondary to dehydration resulting acute kidney injury and mild transaminitis. Pt positive for LE DVT.    Clinical Impression   Pt admitted with above. Feel pt will benefit from acute OT to increase independence, activity tolerance, and strength prior to d/c. Recommending HHOT and 24/7 supervision/assist at home. Spoke with pt about recommending someone be with him while wife is at work.    Follow Up Recommendations  Home health OT;Supervision/Assistance - 24 hour    Equipment Recommendations  None recommended by OT    Recommendations for Other Services       Precautions / Restrictions Precautions Precautions: Fall Restrictions Weight Bearing Restrictions: No      Mobility Bed Mobility Overal bed mobility: Modified Independent (supine to sit)                Transfers Overall transfer level: Needs assistance   Transfers: Sit to/from Stand;Stand Pivot Transfers Sit to Stand: Min guard Stand pivot transfers: Supervision       General transfer comment: Min guard-supervision    Balance    Unsteady with mobility in room-Min guard-supervision assist.                                        ADL Overall ADL's : Needs assistance/impaired     Grooming: Wash/dry face;Brushing hair;Supervision/safety;Standing;Set up               Lower Body Dressing: Min guard;Sit to/from stand   Toilet Transfer: Min guard;Ambulation (sit to stand from bed)           Functional mobility during ADLs: Min guard General ADL Comments: Educated on safety tips and discussed energy conservation.      Vision      Perception     Praxis      Pertinent Vitals/Pain Pain Assessment: No/denies pain     Hand Dominance     Extremity/Trunk Assessment Upper Extremity Assessment Upper Extremity Assessment: Generalized weakness (decreased ROM in shoulder flexors)   Lower Extremity Assessment Lower Extremity Assessment: Defer to PT evaluation       Communication Communication Communication: No difficulties   Cognition Arousal/Alertness: Awake/alert Behavior During Therapy: WFL for tasks assessed/performed Overall Cognitive Status:  (unsure of baseline-decreased safety awareness)                     General Comments       Exercises       Shoulder Instructions      Home Living Family/patient expects to be discharged to:: Private residence Living Arrangements: Spouse/significant other Available Help at Discharge: Family;Available 24 hours/day (reports sister can stay with him while wife works) Type of Home: House Home Access: Stairs to enter [employer] of Steps: 3-4   Home Layout: One level     Bathroom Shower/Tub: Teacher, early years/pre: Handicapped height     [place]: Environmental consultant - 2 wheels (plastic chair he uses as shower chair)          Prior Functioning/Environment          Comments: reports wife  assists with bathing and dressing but sounds like it is part of her culture; reports using walker shortly before admission.         OT Problem List: Decreased strength;Decreased range of motion;Decreased activity tolerance;Impaired balance (sitting and/or standing);Decreased safety awareness;Decreased knowledge of use of DME or AE;Decreased knowledge of precautions   OT Treatment/Interventions: Self-care/ADL training;Therapeutic exercise;Energy conservation;DME and/or AE instruction;Therapeutic activities;Patient/family education;Balance training;Cognitive remediation/compensation    OT Goals(Current goals can be found in the care plan  section) Acute Rehab OT Goals Patient Stated Goal: go home OT Goal Formulation: With patient Time For Goal Achievement: 12/14/15 Potential to Achieve Goals: Good ADL Goals Pt Will Perform Lower Body Dressing: with supervision;sit to/from stand (including gathering clothing items) Pt Will Transfer to Toilet: ambulating;with modified independence (elevated toilet) Additional ADL Goal #1: Pt will independently perform bilateral UE exercises to increase strength. Additional ADL Goal #2: Pt will independently state 3 energy conservation techniques and utilize in session as needed.  OT Frequency: Min 2X/week   Barriers to D/C:            Co-evaluation              End of Session Equipment Utilized During Treatment: Gait belt Nurse Communication: Other (comment) (spoke with student nurse about possible chair alarm)  Activity Tolerance: Patient tolerated treatment well Patient left: in chair;with call bell/phone within reach;with family/visitor present   Time: 5436-0677 OT Time Calculation (min): 15 min Charges:  OT General Charges $OT Visit: 1 Procedure OT Evaluation $OT Eval Moderate Complexity: 1 Procedure G-Codes:    Mylia Pondexter L OTR/L 12/07/2015, 11:13 AM

## 2015-12-07 NOTE — Care Management CC44 (Signed)
Condition Code 44 Documentation Completed  Patient Details  Name: Paul [name] MRN: [id number] Date of Birth: 25-Dec-1940   Condition Code 44 given:    Patient signature on Condition Code 44 notice:    Documentation of 2 MD's agreement:    Code 44 added to claim:       Laurena Slimmer, RN 12/07/2015, 4:38 PM

## 2015-12-07 NOTE — Progress Notes (Signed)
Hematology: Platelet count already recovering and up to 97,000. SGOT falling. Persistent elevation of SGPT. Bilirubin and alkaline phosphatase normal. Impression: #1. Drug-related thrombocytopenia #2. Drug-related hepatitis Recommendation: Okay to start Xarelto 15 mg twice daily 3 weeks then 20 mg daily or other anticoagulant of your choice beginning November 6. He can follow-up with me for his DVT in 2 months. Call [phone] to schedule.

## 2015-12-07 NOTE — Care Management Obs Status (Signed)
[place] NOTIFICATION   Patient Details  Name: METE PURDUM MRN: [id number] Date of Birth: August 29, 1940   Medicare Observation Status Notification Given:  Ernesta Amble, RN 12/07/2015, 4:38 PM

## 2015-12-07 NOTE — Care Management Obs Status (Signed)
[place] NOTIFICATION   Patient Details  Name: Paul [name] MRN: [id number] Date of Birth: 30-May-1940   Medicare Observation Status Notification Given:  Ernesta Amble, RN 12/07/2015, 2:14 PM

## 2015-12-07 NOTE — Progress Notes (Signed)
ANTICOAGULATION CONSULT NOTE - Initial Consult  Pharmacy Consult for Xarelto Indication: DVT  Allergies  Allergen Reactions  . Allopurinol Other (See Comments)    thrombocytopenia    Patient Measurements: Height: 5\' 10"  (177.8 cm) Weight: 214 lb 15.2 oz (97.5 kg) IBW/kg (Calculated) : 73  Vital Signs: Temp: 98.2 F (36.8 C) (11/05 1711) Temp Source: Oral (11/05 1711) BP: 112/48 (11/05 1711) Pulse Rate: 72 (11/05 1711)  Labs:  Recent Labs  12/05/15 0317 12/06/15 0631 12/06/15 0950 12/07/15 0530  HGB 8.2* 8.0*  --  8.0*  HCT 23.4* 22.7*  --  22.2*  PLT 71* 84*  --  97*  APTT  --   --  32  --   LABPROT  --   --  14.8  --   INR  --   --  1.16  --   CREATININE 1.26* 0.82  --  1.01    Estimated Creatinine Clearance: 76.3 mL/min (by C-G formula based on SCr of 1.01 mg/dL).   Medical History: Past Medical History:  Diagnosis Date  . AAA (abdominal aortic aneurysm) ([place])   . Abnormal LFTs   . AKI (acute kidney injury) ([place])   . Anemia of chronic disease   . Arthritis    "all over" (12/04/2015)  . Atherosclerosis of native coronary artery of native heart without angina pectoris   . CAD (coronary artery disease)    with cath 09/26/08 showing severe heart disease in RCA with POBA, mild to mod diesease in circ and LAD  . Chronic back pain    back problems prior to surgery   . Constipation   . Coronary atherosclerosis of native coronary artery   . Dehydration   . Diabetes mellitus    with neuropathy  . Diarrhea   . Elevated liver enzymes   . Generalized weakness   . Gout of right hand   . Hiatal hernia   . History of gastroesophageal reflux (GERD)   . History of stomach ulcers   . Hyperlipidemia   . Hypertension   . Metabolic acidosis   . Thrombocytopenia ([facility])   . Vitamin B12 deficiency   . Vitamin B12 deficiency   . Vitamin D deficiency   . Weakness    Assessment: 75yom with new RLE DVT, currently being treated with arixtra, now to transition to  xarelto. Last arixtra dose today at 0659 so will delay xarelto start until when next arixtra dose would have been due. Thrombocytopenia improving.  Goal of Therapy:  Monitor platelets by anticoagulation protocol: Yes   Plan:  1) Xarelto 15mg  po bid x 21 days then 20mg  po daily 2) Case management consult ordered for cost 3) Will educate  Paul [name] 12/07/2015,5:58 PM

## 2015-12-07 NOTE — Progress Notes (Signed)
PROGRESS NOTE    AIMEE TIMMONS  KGM:[id number] DOB: [birth year] DOA: 12/04/2015 PCP: Wyatt Haste, MD    Brief Narrative:  Patient is 75 year old gentleman who presented with profound hypotension felt secondary to dehydration resulting acute kidney injury and mild transaminitis. Lower extremity Dopplers ordered to rule out DVT. Patient placed on IV fluids. Patient with significant shortness of breath on exertion assessed CT angiogram was done which was negative for PE however concerning for volume overload in the bases. No extremity Dopplers are positive for DVT however patient noted to be thrombocytopenic and a such hematology consultation was obtained.   Assessment & Plan:   Principal Problem:   Acute kidney injury ([place]) Active Problems:   Coronary atherosclerosis of native coronary artery   Gout   Controlled type 2 diabetes mellitus without complication, without long-term current use of insulin ([name])   Thrombocytopenia ([name])   Anemia of chronic disease   AAA (abdominal aortic aneurysm) ([name])-infrarenal 3.4 cm   Recent UTI (urinary tract infection)   Transaminitis   Edema of right lower extremity   Orthostatic hypotension   DVT, lower extremity, proximal, acute, right ([name])   Hypomagnesemia   Thrombocytopenia due to drugs   Allergy status to unspecified drugs, medicaments and biological substances status   DVT of deep femoral vein, right ([place])  #1 acute kidney injury Felt initially to be secondary to prerenal azotemia as patient was noted to be orthostatic on admission in the setting of ACE inhibitor and Voltaren. Patient has been hydrated with IV fluids with improvement in renal function. Patient noted to be significantly short of breath on minimal ambulation per nursing as such CT angiogram of the chest was done which was negative for PE however was worrisome for probable volume overload. Patient is auto diuresis with a urine output of 1.1 L over the past 24 hours.  Patient is -2.270 L during the hospitalization. Monitor renal function.  #2 orthostatic hypotension Improved with hydration.  #3 shortness of breath Patient noted to be significantly short of breath on ambulation yesterday 12/06/2015. Patient noted to lower extremity DVT by Dopplers. CT angiogram of chest was negative for PE, however did show some groundglass opacities particularly in the bases with interstitial thickening favoring edema. Nodularity of liver contour suggesting cirrhosis. 2-D echo with a EF of 60-65% with no wall motion abnormalities, grade 1 diastolic dysfunction, increased right ventricular systolic pressure consistent with mild pulmonary hypertension. IV fluids have been saline locked. Patient with a urine output of 1.1 L over the past 24 hours and patient is -2.27 L during this hospitalization. Monitor closely as patient had presented with orthostasis.   #4 right lower extremity edema secondary to right lower extremity DVT Patient noted to have a right lower extremity DVT per Doppler ultrasound. Patient with a thrombocytopenia and as such unable to place on heparin. Case has been discussed with hematology who had recommended Arixtra as an effective and safe short-term anticoagulation until platelets have recovered and greater than 100,000. Hematology has assessed the patient and consulted on the patient. Platelet count is improved and at 97,000 today. Per hematology okay to start patient on Xarelto 15 mg twice daily 3 weeks and then 20 mg daily. Monitor platelet count closely and for bleeding with start of Xarelto. Patient will follow-up with hematology, Dr.Granfortuna in 2 months.  #5 type 2 diabetes mellitus Hemoglobin A1c 6.3. CBGs have ranged from 105-173. Continue sliding scale insulin.  #6 history of gout Allopurinol has been discontinued per Hematology secondary  to thrombocytopenia.   #7 coronary artery disease Currently stable. 2-D echo with EF of 60-65%, no wall motion  abnormalities, grade 1 diastolic dysfunction, right ventricular systolic pressure increased consistent with mild pulmonary hypertension. pending. Continue aspirin, Lipitor, Lovaza. Follow.  #8 thrombocytopenia Felt to be secondary to allopurinol per hematology. Plavix has been discontinued. Patient denies any bleeding. Platelet count recovering and currently at 97,000. Workup has been negative to date with a normal bilirubin, LFTs trending down, haptoglobin at 151 within normal limits, LDH within normal limits at 172. ANA pending. Per hematology peripheral smear with rare schistocytes seen. Hematology was consulted on the patient and is following. Patient's Plavix and diclofenac have been discontinued. Allopurinol has been discontinued.  Hematology recommending okay to start patient on Xarelto 15 mg twice daily for the next 3 weeks and then 20 mg daily beginning November 6. Monitor closely platelet count and bleeding were transitioned to Xarelto. Outpatient follow-up with hematology.   #9 recent UTI Status post Levaquin which was sensitive to Escherichia coli. Repeat UA negative.    DVT prophylaxis: SCDs. Code Status: Full Family Communication: Updated patient and sister at bedside. Disposition Plan: Likely home with home health versus skilled nursing facility when medically stable with continued improvement of thrombocytopenia and no bleeding in the setting of anticoagulation. Hopefully in the next 24-48 hours..   Consultants:   Hematology: Dr. Granfortuna11/05/2015  Procedures:   CT angiogram 12/05/2015  2-D echo 12/05/2015  Chest x-ray 12/04/2015  Abdominal US 12/04/2015  Lower extremity Dopplers 12/06/2015  Antimicrobials:   None   Subjective: Patient laying in bed. Patient denies chest pain. Patient states improvement of shortness of breath however still short-winded on ambulation. Patient denies any bleeding.  Objective: Vitals:   12/06/15 2055 12/07/15 0137 12/07/15  0510 12/07/15 0906  BP: (!) 124/48  (!) 133/54 (!) 110/91  Pulse: 71  73 75  Resp: 18  18 18   Temp: 99 F (37.2 C)  97.3 F (36.3 C) 98.4 F (36.9 C)  TempSrc: Oral  Oral Oral  SpO2: 99%  100% 99%  Weight: 97.5 kg (214 lb 15.2 oz) 97.5 kg (214 lb 15.2 oz)    Height:        Intake/Output Summary (Last 24 hours) at 12/07/15 1414 Last data filed at 12/07/15 0900  Gross per 24 hour  Intake              960 ml  Output             1150 ml  Net             -190 ml   Filed Weights   12/06/15 0200 12/06/15 2055 12/07/15 0137  Weight: 97.3 kg (214 lb 8.1 oz) 97.5 kg (214 lb 15.2 oz) 97.5 kg (214 lb 15.2 oz)    Examination:  General exam: Appears calm and comfortable  Respiratory system: Some bibasilar crackles noted. Respiratory effort normal. Cardiovascular system: S1 & S2 heard, RRR. No JVD, murmurs, rubs, gallops or clicks. No pedal edema. Gastrointestinal system: Abdomen is nondistended, soft and nontender. No organomegaly or masses felt. Normal bowel sounds heard. Central nervous system: Alert and oriented. No focal neurological deficits. Extremities: Right lower extremity with some slight swelling however nontender to palpation, not tight. Skin: No rashes, lesions or ulcers Psychiatry: Judgement and insight appear normal. Mood & affect appropriate.     Data Reviewed: I have personally reviewed following labs and imaging studies  CBC:  Recent Labs Lab 12/04/15 0920 12/05/15 0317  12/06/15 0631 12/07/15 0530  WBC 8.8 5.8 6.3 5.7  NEUTROABS 6.7  --   --   --   HGB 9.1* 8.2* 8.0* 8.0*  HCT 26.1* 23.4* 22.7* 22.2*  MCV 87.9 89.0 88.3 88.1  PLT 83* 71* 84* 97*   Basic Metabolic Panel:  Recent Labs Lab 12/04/15 0920 12/05/15 0317 12/06/15 0631 12/07/15 0530  NA 137 139 138 135  K 5.1 4.6 4.2 4.4  CL 110 115* 112* 109  CO2 19* 21* 22 23  GLUCOSE 95 100* 80 130*  BUN 43* 35* 23* 25*  CREATININE 1.84* 1.26* 0.82 1.01  CALCIUM 9.1 8.7* 8.8* 8.9  MG  --   --   1.2* 1.8   GFR: Estimated Creatinine Clearance: 76.3 mL/min (by C-G formula based on SCr of 1.01 mg/dL). Liver Function Tests:  Recent Labs Lab 12/04/15 0920 12/05/15 0317 12/06/15 0950 12/07/15 0530  AST 246* 332* 288* 244*  ALT 112* 124* 126* 153*  ALKPHOS 79 85 65 79  BILITOT 0.8 0.7 0.6 0.5  PROT 6.3* 5.4* 5.4* 5.7*  ALBUMIN 2.7* 2.2* 2.4* 2.3*   No results for input(s): LIPASE, AMYLASE in the last 168 hours. No results for input(s): AMMONIA in the last 168 hours. Coagulation Profile:  Recent Labs Lab 12/06/15 0950  INR 1.16   Cardiac Enzymes: No results for input(s): CKTOTAL, CKMB, CKMBINDEX, TROPONINI in the last 168 hours. BNP (last 3 results) No results for input(s): PROBNP in the last 8760 hours. HbA1C: No results for input(s): HGBA1C in the last 72 hours. CBG:  Recent Labs Lab 12/06/15 1204 12/06/15 1724 12/06/15 2054 12/07/15 0756 12/07/15 1220  GLUCAP 151* 175* 165* 105* 141*   Lipid Profile: No results for input(s): CHOL, HDL, LDLCALC, TRIG, CHOLHDL, LDLDIRECT in the last 72 hours. Thyroid Function Tests: No results for input(s): TSH, T4TOTAL, FREET4, T3FREE, THYROIDAB in the last 72 hours. Anemia Panel:  Recent Labs  12/06/15 0950  RETICCTPCT 0.8   Sepsis Labs:  Recent Labs Lab 12/04/15 4332  LATICACIDVEN 1.83    Recent Results (from the past 240 hour(s))  Urine culture     Status: None   Collection Time: 12/04/15 11:37 AM  Result Value Ref Range Status   Specimen Description URINE, CATHETERIZED  Final   Special Requests NONE  Final   Culture NO GROWTH  Final   Report Status 12/05/2015 FINAL  Final         Radiology Studies: Ct Angio Chest Pe W Or Wo Contrast  Result Date: 12/05/2015 CLINICAL DATA:  Shortness of breath. EXAM: CT ANGIOGRAPHY CHEST WITH CONTRAST TECHNIQUE: Multidetector CT imaging of the chest was performed using the standard protocol during bolus administration of intravenous contrast. Multiplanar CT image  reconstructions and MIPs were obtained to evaluate the vascular anatomy. CONTRAST:  100 mL of Isovue 370 COMPARISON:  Chest x-ray from yesterday FINDINGS: Cardiovascular: Cardiomegaly is noted. No pleural or pericardial effusions. Coronary artery calcifications are identified. The thoracic aorta is atherosclerotic but non aneurysmal with no dissection. No pulmonary emboli identified. Mediastinum/Nodes: Shotty nodes in the mediastinum are likely reactive. No uncalcified adenopathy identified. Lungs/Pleura: No pleural abnormalities are identified. A calcified granuloma is seen in the superior segment of the right lower lobe. No suspicious pulmonary nodules or masses. No suspicious focal infiltrates. Ground-glass opacities are identified with associated interstitial thickening, particularly in the bases but involving all lobes. Upper Abdomen: Mildly nodular liver contour. Granulomas in the splenic spleen. No other abnormalities in the upper abdomen. Choose Musculoskeletal: No  chest wall abnormality. No acute or significant osseous findings. Review of the MIP images confirms the above findings. IMPRESSION: 1. No pulmonary emboli. 2. Increased ground-glass opacities, particularly in the bases, with interstitial thickening. These findings are favored to represent edema. 3. 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 Recommend clinical correlation. Electronically Signed   By: Dorise Bullion III M.D   On: 12/05/2015 14:41        Scheduled Meds: . aspirin  81 mg Oral Daily  . ferrous sulfate  325 mg Oral Q breakfast  . fondaparinux (ARIXTRA) injection  7.5 mg Subcutaneous Q0600  . insulin aspart  0-5 Units Subcutaneous QHS  . insulin aspart  0-9 Units Subcutaneous TID WC  . multivitamin with minerals  1 tablet Oral Daily  . omega-3 acid ethyl esters  1 g Oral BID  . vitamin B-12  1,000 mcg Oral Daily   Continuous Infusions:    LOS: 1 day    Time spent: 28 minutes    Travone Georg,  MD [facility] Pager [phone]  If 7PM-7AM, please contact night-coverage [email or web address] Password [email or web address] 12/07/2015, 2:14 PM

## 2015-12-07 NOTE — Care Management Note (Addendum)
Case Management Note  Patient Details  Name: AIRIK GOODLIN MRN: [id number] Date of Birth: [birth year]  Subjective/Objective:            QUE MENEELY is a 75 y.o. male with medical history significant for known CAD, diabetes on metformin, CAD, anemia of chronic disease.  Pt presented with profound hypotension felt secondary to dehydration resulting acute kidney injury and mild transaminitis. Pt was positive for RE DVT.         Action/Plan:  CM met with patient and spouse concerning care transitional planning. Patient will be discharged home on Arixtra, benefits check will be done tomorrow prior to discharge. Patient lives at  home with wife,  Discussed recommendations for SNF patient declined recommendations for placement  Discussed HH  Service options RN, PT/OT, patient does not think he needs [facility] PT or OT but agreeable to [facility] services. Offered choice [facility] selected. Referral called to [facility]  For [facility]. CM Awaiting HH orders. Patient verbalized understanding teach back done concerning care transitional plan. Unit CM will continue to follow up.   Expected Discharge Date:    12/08/15              Expected Discharge Plan:  [place]  In-House Referral:     Discharge [place] service   Post Acute Care Choice:    Choice offered to:  Patient, Spouse  DME Arranged:    DME Agency:     HH Arranged:  RN [place] Agency:  [place]  Status of Service:  Completed, signed off  If discussed at [place] of Stay Meetings, dates discussed:    Additional CommentsLaurena Slimmer, RN 12/07/2015, 2:49 PM

## 2015-12-07 NOTE — Care Management CC44 (Signed)
Condition Code 44 Documentation Completed  Patient Details  Name: Paul [name] MRN: [id number] Date of Birth: 1940/07/11   Condition Code 44 given:    Patient signature on Condition Code 44 notice:    Documentation of 2 MD's agreement:    Code 44 added to claim:       Laurena Slimmer, RN 12/07/2015, 4:37 PM

## 2015-12-08 DIAGNOSIS — I824Y1 Acute embolism and thrombosis of unspecified deep veins of right proximal lower extremity: Secondary | ICD-10-CM | POA: Diagnosis not present

## 2015-12-08 DIAGNOSIS — I82411 Acute embolism and thrombosis of right femoral vein: Secondary | ICD-10-CM | POA: Diagnosis not present

## 2015-12-08 DIAGNOSIS — E119 Type 2 diabetes mellitus without complications: Secondary | ICD-10-CM | POA: Diagnosis not present

## 2015-12-08 DIAGNOSIS — R0602 Shortness of breath: Secondary | ICD-10-CM

## 2015-12-08 DIAGNOSIS — R600 Localized edema: Secondary | ICD-10-CM | POA: Diagnosis not present

## 2015-12-08 DIAGNOSIS — N179 Acute kidney failure, unspecified: Secondary | ICD-10-CM | POA: Diagnosis not present

## 2015-12-08 LAB — BASIC METABOLIC PANEL
Anion gap: 4 — ABNORMAL LOW (ref 5–15)
BUN: 28 mg/dL — ABNORMAL HIGH (ref 6–20)
CO2: 23 mmol/L (ref 22–32)
Calcium: 8.9 mg/dL (ref 8.9–10.3)
Chloride: 109 mmol/L (ref 101–111)
Creatinine, Ser: 0.93 mg/dL (ref 0.61–1.24)
GFR calc Af Amer: >60 mL/min (ref >60)
GFR calc non Af Amer: >60 mL/min (ref >60)
Glucose, Bld: 126 mg/dL — ABNORMAL HIGH (ref 65–99)
Potassium: 4.3 mmol/L (ref 3.5–5.1)
Sodium: 136 mmol/L (ref 135–145)

## 2015-12-08 LAB — CBC
HCT: 22.4 % — ABNORMAL LOW (ref 39.0–52.0)
Hemoglobin: 7.8 g/dL — ABNORMAL LOW (ref 13.0–17.0)
MCH: 30.8 pg (ref 26.0–34.0)
MCHC: 34.8 g/dL (ref 30.0–36.0)
MCV: 88.5 fL (ref 78.0–100.0)
Platelets: 112 10*3/uL — ABNORMAL LOW (ref 150–400)
RBC: 2.53 MIL/uL — ABNORMAL LOW (ref 4.22–5.81)
RDW: 17.2 % — ABNORMAL HIGH (ref 11.5–15.5)
WBC: 6 10*3/uL (ref 4.0–10.5)

## 2015-12-08 LAB — ANTINUCLEAR ANTIBODIES, IFA: ANA Ab, IFA: NEGATIVE

## 2015-12-08 LAB — GLUCOSE, CAPILLARY
Glucose-Capillary: 122 mg/dL — ABNORMAL HIGH (ref 65–99)
Glucose-Capillary: 157 mg/dL — ABNORMAL HIGH (ref 65–99)

## 2015-12-08 MED ORDER — RIVAROXABAN 20 MG PO TABS: 20.0000 mg | ORAL_TABLET | Freq: Every day | ORAL | 3 refills | Status: DC

## 2015-12-08 MED ORDER — METOPROLOL TARTRATE 25 MG PO TABS: 12.5000 mg | ORAL_TABLET | Freq: Two times a day (BID) | ORAL | 3 refills | Status: DC

## 2015-12-08 MED ORDER — RIVAROXABAN (XARELTO) VTE STARTER PACK (15 & 20 MG): ORAL_TABLET | ORAL | 0 refills | Status: DC

## 2015-12-08 MED ORDER — ISOSORBIDE MONONITRATE ER 30 MG PO TB24: 30.0000 mg | ORAL_TABLET | Freq: Every day | ORAL | 3 refills | Status: DC

## 2015-12-08 NOTE — Progress Notes (Signed)
Patient discharged to home with home health. IV and telemetry were d/c'd. AVS, medications, and follow up appointments reviewed with patient. Patient left floor via wheelchair with family and tech

## 2015-12-08 NOTE — Care Management Note (Signed)
Case Management Note  Patient Details  Name: [name] MRN: [id number] Date of Birth: 10-Oct-1940  Subjective/Objective:        CM following for progression and d/c planning.            Action/Plan: 12/08/15 Xarelto card for 30 day free trail given and explained to pt .  Pt notified of copay of $45 for this medication after 30 days, pt states that this is managable for him.   Expected Discharge Date:                  Expected Discharge Plan:  [place]  In-House Referral:     Discharge planning Services     Post Acute Care Choice:    Choice offered to:  Patient, Spouse  DME Arranged:    DME Agency:     HH Arranged:  RN [place] Agency:  [place]  Status of Service:  Completed, signed off  If discussed at [place] of Stay Meetings, dates discussed:    Additional Comments:  Adron Bene, RN 12/08/2015, 11:51 AM

## 2015-12-08 NOTE — Telephone Encounter (Signed)
Thanks. cdm 

## 2015-12-08 NOTE — Care Management (Signed)
Request for copay for Arixtra sent earlier this am, now requesting copay for Xarelto. Wait response from pt insurance provider.

## 2015-12-08 NOTE — Clinical Social Work Note (Signed)
Social work received SNF consult for patient, however CSW advised this morning in progression that the d/c plan is home. CSW visited with patient and confirmed his plan to discharge home with home health services. Per Malachy Mood, case manager, [facility] services already set-up.  CSW signing off, however please reconsult if any other SW services needed prior to discharge.  Reda Citron Givens, MSW, LCSW Licensed Clinical Social Worker [place] [phone]

## 2015-12-08 NOTE — Progress Notes (Signed)
Occupational Therapy Treatment Patient Details Name: Paul [name] MRN: [id number] DOB: November 23, 1940 Today's Date: 12/08/2015    History of present illness Paul [name] is a 75 y.o. male with medical history significant for known CAD, diabetes on metformin, CAD, anemia of chronic disease.  Pt presented with profound hypotension felt secondary to dehydration resulting acute kidney injury and mild transaminitis. Pt positive for LE DVT.    OT comments  Pt making progress with functional goals. OT will continue to follow acutely  Follow Up Recommendations  No OT follow up;Supervision/Assistance - 24 hour    Equipment Recommendations  None recommended by OT    Recommendations for Other Services      Precautions / Restrictions Precautions Precautions: Fall Restrictions Weight Bearing Restrictions: No       Mobility Bed Mobility Overal bed mobility: Modified Independent                Transfers Overall transfer level: Needs assistance Equipment used: None;1 person hand held assist Transfers: Sit to/from Stand Sit to Stand: Supervision Stand pivot transfers: Supervision            Balance Overall balance assessment: Needs assistance   Sitting balance-Leahy Scale: Good       Standing balance-Leahy Scale: Fair                     ADL Overall ADL's : Needs assistance/impaired     Grooming: Wash/dry face;Standing;Wash/dry hands;Supervision/safety               Lower Body Dressing: Supervision/safety;Min guard   Toilet Transfer: Supervision/safety;Ambulation       Tub/ Shower Transfer: Grab bars;Ambulation;Supervision/safety     General ADL Comments: Pt recalled 2/3 EC techniques during ADL mobility. Reviewed EC techniques with pt      Vision  no change from baseline                          Cognition   Behavior During Therapy: WFL for tasks assessed/performed Overall Cognitive Status: Within Functional Limits for  tasks assessed                       Extremity/Trunk Assessment   WFL                        General Comments  pt pleasant and cooperative    Pertinent Vitals/ Pain       Pain Assessment: No/denies pain  Home Living  home with wife; wife works during the day                                        Prior Functioning/Environment  independent           Frequency  Min 2X/week        Progress Toward Goals  OT Goals(current goals can now be found in the care plan section)  Progress towards OT goals: Progressing toward goals  Acute Rehab OT Goals Patient Stated Goal: go home  Plan Discharge plan remains appropriate                     End of Session     Activity Tolerance Patient tolerated treatment well   Patient Left in chair;with call bell/phone within reach  Time: 8875-7972 OT Time Calculation (min): 24 min  Charges: OT General Charges $OT Visit: 1 Procedure OT Treatments $Self Care/Home Management : 8-22 mins $Therapeutic Activity: 8-22 mins  Paul [name] 12/08/2015, 12:06 PM

## 2015-12-08 NOTE — Discharge Summary (Signed)
Physician Discharge Summary  Paul [name] OJJ:[id number] DOB: 04-05-1940 DOA: 12/04/2015  PCP: Wyatt Haste, MD  Admit date: 12/04/2015 Discharge date: 12/08/2015  Time spent: 60 minutes  Recommendations for Outpatient Follow-up:  1. Follow-up with Wyatt Haste, MD in 2 weeks. All follow-up patient will need a comprehensive metabolic profile done to follow-up on electrolytes renal function and LFTs. Patient's blood pressure also to need to be reassessed at that time. Patient's blood pressure medication doses were decreased in half due to borderline blood pressure. 2. Follow-up with [name] in 2 months.   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 male with medical history significant for known CAD, diabetes on metformin, CAD, anemia of chronic disease. Patient recently treated in the early part of October for a Escherichia coli UTI sensitive to Levaquin. Subsequently after that patient developed  significant bilateral gout symptoms involving ankles knees and hands with edema and redness and PCP continued allopurinol and resume colchicine. The past several days has had reported increased generalized weakness despite drinking up to 4 bottles of water yesterday as well as some coffee. He had not had any nausea vomiting diarrhea. Any chest pain or shortness of breath. He had noticed some slight bilateral lower extremity swelling more prominent on the right for several weeks. With primary care physician today and was found to be hypotensive (unable to locate old pressure reading in preadmission chart) and was also noted to be pale in appearance  ED Course:  Vital Signs: BP (!) 129/48   Pulse 63   Temp 97.6 F (36.4 C) (Oral)   Resp 15   SpO2 99%  PCXR: Mild interstitial prominence and lower lungs no frank pulmonary edema or focal airspace disease Right upper quadrant abdominal ultrasound: No acute hepato-biliary abnormality, subcentimeter splenic granulomas, 3.4 cm infrarenal abdominal aortic aneurysm. Lab data: Sodium 137, potassium 5.1, CO2 19, BUN 43, creatinine 1.84, albumin 2.7, AST 246, ALT 112, total routine 6.3, total bilirubin 0.8, BNP 88, poc troponin 0.01, lactic acid 1.83, LV BCs 8800 with normal differential, hemoglobin 9.1, platelets 83,000, urinalysis and culture have been obtained and results are pending Medications and treatments: Normal saline bolus 500 mL 1  Hospital Course:  #1 right lower extremity edema secondary to right lower extremity DVT Patient noted to have a right lower extremity DVT per Doppler ultrasound. Patient with thrombocytopenia and as such unable to place on heparin. Case has been discussed with hematology who had recommended Arixtra as an effective and safe  short-term anticoagulation until platelets recovered and greater than 100,000. Hematology has assessed the patient and consulted on the patient. Platelet count improved to 97,000. Per hematology okay to  start patient on Xarelto 15 mg twice daily 3 weeks and then 20 mg daily. And had no further bleeding. Patient be discharged home on Xarelto. Patient will follow-up with hematology, Dr.Granfortuna in 2 months.  #2 acute kidney injury Felt initially to be secondary to prerenal azotemia as patient was noted to be orthostatic on admission in the setting of ACE inhibitor and Voltaren. Patient was hydrated with IV fluids with improvement in renal function. Patient noted to be significantly short of breath on minimal ambulation per nursing as such CT angiogram of the chest was done which was negative for PE however was worrisome for probable volume overload. Patient auto diuresised during the hospitalization was -1.250 L during the hospitalization. Patient's renal function improved and acute kidney injury had resolved by day of discharge.   #3 orthostatic hypotension Patient noted on admission to be hypotensive and hydrated with IV fluids. Patient's orthostasis resolved during the hospitalization.   #4 shortness of breath Patient noted to be significantly short of breath on ambulation 12/06/2015. Patient noted to have lower extremity DVT by Dopplers. CT angiogram of chest was negative for PE, however did show some groundglass opacities particularly in the bases with interstitial thickening favoring edema. Nodularity of liver contour suggesting cirrhosis. 2-D echo with a EF of 60-65% with no wall motion abnormalities, grade 1 diastolic dysfunction, increased right ventricular systolic pressure consistent with mild pulmonary hypertension. IV fluids have been saline locked. Patient auto diuresed during the hospitalization was -1.25 L. Patient be discharged home in stable and improved condition. Outpatient follow-up with PCP.    #5 type 2 diabetes mellitus Hemoglobin A1c 6.3. CBGs remained stable throughout the hospitalization. Patient was maintained on a sliding scale insulin. have ranged from 105-173.  Continue sliding scale insulin.  #6 history of gout Allopurinol has been discontinued per Hematology secondary to thrombocytopenia.   #7 coronary artery disease Remained stable throughout the hospitalization. 2-D echo with EF of 60-65%, no wall motion abnormalities, grade 1 diastolic dysfunction, right ventricular systolic pressure increased consistent with mild pulmonary hypertension. Patient was maintained on home regimen of aspirin and lovaza. Lipitor was discontinued secondary to transaminitis. Due to presentation would orthostatic hypotension patient's imdur and beta blocker and ACE inhibitor were held during the hospitalization. Patient blood pressure improved however were somewhat borderline. On day of discharge patient's Inderal was resumed at half home dose as well as patient's beta blocker was resumed at half home dose. ACE inhibitor was discontinued. Outpatient follow-up.  #8 thrombocytopenia Felt to be secondary to allopurinol per hematology. Plavix had been discontinued. Patient denied any bleeding. Platelet during the hospitalization was noted to be thrombocytopenic with a platelet count as low as 71.  Workup had been negative to date with a normal bilirubin, LFTs trended down, haptoglobin at 151 within normal limits, LDH within normal limits at 172. ANA negative. Hematology was consulted and patient was seen in consultation by [name]. Per hematology peripheral smear with rare schistocytes seen. Patient's Plavix and diclofenac were discontinued. Allopurinol was discontinued. Due to DVT noted during the hospitalization and thrombocytopenia patient was subsequently placed on Arixtra. Allopurinol was discontinued. Patient's platelet count improved on a daily basis and once patient's platelet count was 97,000 hematology felt it was okay  to start patient on Xarelto 15 mg twice daily for the next 3 weeks and  then 20 mg daily beginning November 6.  patient's platelet count was followed  and thrombocytopenia improved. Patient did not have any further bleeding. Outpatient follow-up with hematology.   #9 transaminitis Patient was noted to have a transaminitis during the hospitalization which may have been secondary to congestion/right heart failure. Transaminitis trended down with diuresis. Patient's time statin was also discontinued. Outpatient follow-up.   #10 recent UTI Status post Levaquin which was sensitive to Escherichia coli. Repeat UA negative.     Procedures:  CT angiogram 12/05/2015  2-D echo 12/05/2015  Chest x-ray 12/04/2015  Abdominal US 12/04/2015  Lower extremity Dopplers 12/06/2015  Consultations:  Hematology: Dr. Granfortuna11/05/2015  Discharge Exam: Vitals:   12/07/15 2101 12/08/15 0538  BP: (!) 126/53 (!) 126/47  Pulse: 70 83  Resp: 18 18  Temp: 98.1 F (36.7 C) 98.3 F (36.8 C)    General: NAD Cardiovascular: RRR Respiratory: CTAB  Discharge Instructions   Discharge Instructions    Diet - low sodium heart healthy    Complete by:  As directed    Increase activity slowly    Complete by:  As directed      Discharge Medication List as of 12/08/2015  3:34 PM    START taking these medications   Details  Rivaroxaban (XARELTO STARTER PACK) 15 & 20 MG TBPK Take as directed on package: Start with one 15mg  tablet by mouth twice a day with food. On Day 22, switch to one 20mg  tablet once a day with food., Print    rivaroxaban (XARELTO) 20 MG TABS tablet Take 1 tablet (20 mg total) by mouth daily with supper., Starting Wed 01/07/2016, Print      CONTINUE these medications which have CHANGED   Details  isosorbide mononitrate (IMDUR) 30 MG 24 hr tablet Take 1 tablet (30 mg total) by mouth daily., Starting Mon 12/08/2015, Print    metoprolol tartrate (LOPRESSOR) 25 MG tablet Take 0.5 tablets (12.5 mg total) by mouth 2 (two) times daily., Starting Mon 12/08/2015, Print      CONTINUE these medications which have NOT CHANGED    Details  aspirin 81 MG chewable tablet Chew 81 mg by mouth daily., Until Discontinued, Historical Med    ferrous sulfate 325 (65 FE) MG tablet Take 325 mg by mouth daily with breakfast., Historical Med    fish oil-omega-3 fatty acids 1000 MG capsule Take 1 g by mouth 2 (two) times daily. , Historical Med    metFORMIN (GLUCOPHAGE-XR) 500 MG 24 hr tablet Take 2 tablets by mouth  daily, Normal    Multiple Vitamins-Minerals (CENTRUM) tablet Take 1 tablet by mouth daily., Historical Med    vitamin B-12 (CYANOCOBALAMIN) 1000 MCG tablet Take 1 tablet (1,000 mcg total) by mouth daily., Starting Fri 05/09/2015, Normal    ACCU-CHEK AVIVA PLUS test strip Test once to twice daily, Normal    nitroGLYCERIN (NITROSTAT) 0.4 MG SL tablet Place 0.4 mg under the tongue every 5 (five) minutes as needed for chest pain. For 3 doses, Starting 10/12/2011, Until Discontinued, Historical Med      STOP taking these medications     allopurinol (ZYLOPRIM) 100 MG tablet      clopidogrel (PLAVIX) 75 MG tablet      colchicine 0.6 MG tablet      diclofenac (VOLTAREN) 75 MG EC tablet      quinapril (ACCUPRIL) 20 MG tablet      simvastatin (ZOCOR) 80 MG tablet        Allergies  Allergen Reactions  .  Allopurinol Other (See Comments)    thrombocytopenia   Follow-up Information    [facility] Follow up.   Why:  [facility] Contact information: [address] [place] [address] [phone]        Annia Belt, MD Follow up on 02/23/2016.   Specialty:  Oncology Why:  f/u at 130pm Contact information: [place] [address] [phone]        Wyatt Haste, MD. Schedule an appointment as soon as possible for a visit in 2 week(s).   Specialty:  Family Medicine Contact information: [place] [address] [phone]            The results of significant diagnostics from this hospitalization  (including imaging, microbiology, ancillary and laboratory) are listed below for reference.    Significant Diagnostic Studies: Ct Angio Chest Pe W Or Wo Contrast  Result Date: 12/05/2015 CLINICAL DATA:  Shortness of breath. EXAM: CT ANGIOGRAPHY CHEST WITH CONTRAST TECHNIQUE: Multidetector CT imaging of the chest was performed using the standard protocol during bolus administration of intravenous contrast. Multiplanar CT image reconstructions and MIPs were obtained to evaluate the vascular anatomy. CONTRAST:  100 mL of Isovue 370 COMPARISON:  Chest x-ray from yesterday FINDINGS: Cardiovascular: Cardiomegaly is noted. No pleural or pericardial effusions. Coronary artery calcifications are identified. The thoracic aorta is atherosclerotic but non aneurysmal with no dissection. No pulmonary emboli identified. Mediastinum/Nodes: Shotty nodes in the mediastinum are likely reactive. No uncalcified adenopathy identified. Lungs/Pleura: No pleural abnormalities are identified. A calcified granuloma is seen in the superior segment of the right lower lobe. No suspicious pulmonary nodules or masses. No suspicious focal infiltrates. Ground-glass opacities are identified with associated interstitial thickening, particularly in the bases but involving all lobes. Upper Abdomen: Mildly nodular liver contour. Granulomas in the splenic spleen. No other abnormalities in the upper abdomen. Choose Musculoskeletal: No chest wall abnormality. No acute or significant osseous findings. Review of the MIP images confirms the above findings. IMPRESSION: 1. No pulmonary emboli. 2. Increased ground-glass opacities, particularly in the bases, with interstitial thickening. These findings are favored to represent edema. 3. 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 Recommend clinical correlation. Electronically Signed   By: Dorise Bullion III M.D   On: 12/05/2015 14:41   US Abdomen Complete  Result Date: 12/04/2015 CLINICAL DATA:   Hypotension, weakness. EXAM: ABDOMEN ULTRASOUND COMPLETE COMPARISON:  Right upper quadrant abdominal ultrasound of April 18, 2015 FINDINGS: Gallbladder: No gallstones or wall thickening visualized. No sonographic Murphy sign noted by sonographer. Common bile duct: Diameter: 4.2 mm Liver: No focal lesion identified. Within normal limits in parenchymal echogenicity. IVC: No abnormality visualized. Pancreas: Visualized portion unremarkable. Spleen: Size and appearance within normal limits. Echogenic foci error compatible with subcentimeter granulomas. Right Kidney: Length: 11.2 cm. Echogenicity within normal limits. No mass or hydronephrosis visualized. Left Kidney: Length: 12.0 cm. Echogenicity within normal limits. No mass or hydronephrosis visualized. Abdominal aorta: No aneurysm visualized. Other findings: There is a distal abdominal aortic aneurysm measuring 3.4 cm in diameter. The proximal aorta measures 2.2 cm in diameter. The mid aorta also measures 2.2 cm in diameter. IMPRESSION: 1. No acute hepatobiliary abnormality. 2. Sub cm splenic granulomas. 3. 3.4 cm infrarenal abdominal aortic aneurysm. Recommend followup by ultrasound in 3 years. This recommendation follows ACR consensus guidelines: White Paper of the ACR Incidental Findings Committee II on Vascular Findings. Joellyn Rued Radiol 2013; 71:245-809 Electronically Signed   By: Shanon Brow  [place] M.D.   On: 12/04/2015 11:05   Dg Chest Port 1 View  Result Date: 12/04/2015 CLINICAL DATA:  Hypotension and weakness. EXAM: PORTABLE CHEST 1 VIEW COMPARISON:  01/23/2007 FINDINGS: Left lung base is not completely imaged. Mild interstitial prominence in the lower lungs. Otherwise, the lungs are clear. Heart size is within normal limits and stable. The trachea is midline. IMPRESSION: Mild interstitial prominence in the lower lungs is nonspecific. No evidence for frank pulmonary edema or focal airspace disease. Electronically Signed   By: Markus Daft M.D.   On:  12/04/2015 09:40    Microbiology: Recent Results (from the past 240 hour(s))  Urine culture     Status: None   Collection Time: 12/04/15 11:37 AM  Result Value Ref Range Status   Specimen Description URINE, CATHETERIZED  Final   Special Requests NONE  Final   Culture NO GROWTH  Final   Report Status 12/05/2015 FINAL  Final     Labs: Basic Metabolic Panel:  Recent Labs Lab 12/04/15 0920 12/05/15 0317 12/06/15 0631 12/07/15 0530 12/08/15 0726  NA 137 139 138 135 136  K 5.1 4.6 4.2 4.4 4.3  CL 110 115* 112* 109 109  CO2 19* 21* 22 23 23   GLUCOSE 95 100* 80 130* 126*  BUN 43* 35* 23* 25* 28*  CREATININE 1.84* 1.26* 0.82 1.01 0.93  CALCIUM 9.1 8.7* 8.8* 8.9 8.9  MG  --   --  1.2* 1.8  --    Liver Function Tests:  Recent Labs Lab 12/04/15 0920 12/05/15 0317 12/06/15 0950 12/07/15 0530  AST 246* 332* 288* 244*  ALT 112* 124* 126* 153*  ALKPHOS 79 85 65 79  BILITOT 0.8 0.7 0.6 0.5  PROT 6.3* 5.4* 5.4* 5.7*  ALBUMIN 2.7* 2.2* 2.4* 2.3*   No results for input(s): LIPASE, AMYLASE in the last 168 hours. No results for input(s): AMMONIA in the last 168 hours. CBC:  Recent Labs Lab 12/04/15 0920 12/05/15 0317 12/06/15 0631 12/07/15 0530 12/08/15 0726  WBC 8.8 5.8 6.3 5.7 6.0  NEUTROABS 6.7  --   --   --   --   HGB 9.1* 8.2* 8.0* 8.0* 7.8*  HCT 26.1* 23.4* 22.7* 22.2* 22.4*  MCV 87.9 89.0 88.3 88.1 88.5  PLT 83* 71* 84* 97* 112*   Cardiac Enzymes: No results for input(s): CKTOTAL, CKMB, CKMBINDEX, TROPONINI in the last 168 hours. BNP: BNP (last 3 results)  Recent Labs  12/04/15 0920 12/06/15 0631  BNP 87.7 155.6*    ProBNP (last 3 results) No results for input(s): PROBNP in the last 8760 hours.  CBG:  Recent Labs Lab 12/07/15 1220 12/07/15 1709 12/07/15 2058 12/08/15 0743 12/08/15 1214  GLUCAP 141* 173* 181* 122* 157*       Signed:  THOMPSON,DANIEL MD.  [facility] 12/08/2015, 4:40 PM

## 2015-12-08 NOTE — Discharge Instructions (Signed)
Information on my medicine - XARELTO (rivaroxaban)  This medication education was reviewed with me or my healthcare representative as part of my discharge preparation.    WHY WAS XARELTO PRESCRIBED FOR YOU? Xarelto was prescribed to treat blood clots that may have been found in the veins of your legs (deep vein thrombosis) or in your lungs (pulmonary embolism) and to reduce the risk of them occurring again.  What do you need to know about Xarelto? The starting dose is one 15 mg tablet taken TWICE daily with food for the FIRST 21 DAYS then on  12/29/2015  the dose is changed to one 20 mg tablet taken ONCE A DAY with your evening meal.  DO NOT stop taking Xarelto without talking to the health care provider who prescribed the medication.  Refill your prescription for 20 mg tablets before you run out.  After discharge, you should have regular check-up appointments with your healthcare provider that is prescribing your Xarelto.  In the future your dose may need to be changed if your kidney function changes by a significant amount.  What do you do if you miss a dose? If you are taking Xarelto TWICE DAILY and you miss a dose, take it as soon as you remember. You may take two 15 mg tablets (total 30 mg) at the same time then resume your regularly scheduled 15 mg twice daily the next day.  If you are taking Xarelto ONCE DAILY and you miss a dose, take it as soon as you remember on the same day then continue your regularly scheduled once daily regimen the next day. Do not take two doses of Xarelto at the same time.   Important Safety Information Xarelto is a blood thinner medicine that can cause bleeding. You should call your healthcare provider right away if you experience any of the following: ? Bleeding from an injury or your nose that does not stop. ? Unusual colored urine (red or dark brown) or unusual colored stools (red or black). ? Unusual bruising for unknown reasons. ? A serious  fall or if you hit your head (even if there is no bleeding).  Some medicines may interact with Xarelto and might increase your risk of bleeding while on Xarelto. To help avoid this, consult your healthcare provider or pharmacist prior to using any new prescription or non-prescription medications, including herbals, vitamins, non-steroidal anti-inflammatory drugs (NSAIDs) and supplements.  This website has more information on Xarelto: [email or web address].[email or web address]

## 2015-12-09 ENCOUNTER — Telehealth: Payer: Self-pay | Admitting: Family Medicine

## 2015-12-09 NOTE — Progress Notes (Signed)
OT Note - Addendum    31-Dec-2015 1115  OT Visit Information  Last OT Received On December 31, 2015  OT G-codes **NOT FOR INPATIENT CLASS**  Functional Assessment Tool Used clinical judgement  Functional Limitation Self care  Self Care Current Status [phone]) CI  Self Care Goal Status [phone]) Shirley, OTR/L for Roseanne Reno OTR/L [phone] 12/09/2015

## 2015-12-09 NOTE — Telephone Encounter (Signed)
Called [facility] T# [phone] for P.A. Xarelto starter pack & also maintenance dose were both approved til 01/31/17 HY#[id number].  Called pharmacy & both went thru for $45 each.  Juliann Pulse calling pt for hospital follow up & will inform.

## 2015-12-09 NOTE — Telephone Encounter (Signed)
See other telephone call this was approved

## 2015-12-09 NOTE — Telephone Encounter (Signed)
Pt was called concerning recent hospital stay. Pt states that he is doing ok but the hospital stopped a lot of his meds. He states he does number stand what according to the hospital he is to take. Pt did have issues getting Xarelto filled. A prior authorization was required and Mickel Baas took care of that and I informed him that is was taken care and ready for him to pick. After hour protocol was discussed with pt. Pt stated he would like to come in soon to discuss meds with JCL. Pt made an appt for tomorrow. Pt was advised to bring all medications with him and he stated that he would.

## 2015-12-09 NOTE — Telephone Encounter (Signed)
Pt called stating that he was released from the hospital yesterday and he attempted to pick up medicine and pharmacy told him that Dr Redmond School need to contact [facility] before he can pick up Xarelto. Called [facility] to get more info and they advised that Xarelto require a prior [place]

## 2015-12-10 ENCOUNTER — Encounter: Payer: Self-pay | Admitting: Family Medicine

## 2015-12-10 ENCOUNTER — Ambulatory Visit (INDEPENDENT_AMBULATORY_CARE_PROVIDER_SITE_OTHER): Payer: Medicare Other | Admitting: Family Medicine

## 2015-12-10 VITALS — BP 100/40 | HR 67 | Wt 211.4 lb

## 2015-12-10 DIAGNOSIS — D638 Anemia in other chronic diseases classified elsewhere: Secondary | ICD-10-CM | POA: Diagnosis not present

## 2015-12-10 DIAGNOSIS — I959 Hypotension, unspecified: Secondary | ICD-10-CM | POA: Diagnosis not present

## 2015-12-10 DIAGNOSIS — M109 Gout, unspecified: Secondary | ICD-10-CM | POA: Diagnosis not present

## 2015-12-10 DIAGNOSIS — E119 Type 2 diabetes mellitus without complications: Secondary | ICD-10-CM

## 2015-12-10 DIAGNOSIS — I824Y9 Acute embolism and thrombosis of unspecified deep veins of unspecified proximal lower extremity: Secondary | ICD-10-CM

## 2015-12-10 NOTE — Progress Notes (Signed)
   Subjective:    Patient ID: Paul [name], male    DOB: 1941-01-12, 75 y.o.   MRN: [id number]  HPI He is here for a posthospitalization follow-up. He was originally admitted for evaluation of treatment of hypotension. During the hospitalization he was found to have DVT and presently is on Xarelto twice a day. His medications were readjusted. He was taken off of several medications including allopurinol, simvastatin, colchicine Plavix and quinapril. During the hospitalization he did have difficulty with transaminase elevation as well as acute renal injury that did improve. Presently he still feels weak and slightly dizzy. He does have underlying diabetes and continues on those medications. He also has a history of gout. Hemoglobin was low.  Review of Systems     Objective:   Physical Exam Alert and in no distress. Blood pressure is recorded. Cardiac exam shows regular rhythm without murmurs gallops. Lungs are clear to auscultation.       Assessment & Plan:  Controlled type 2 diabetes mellitus without complication, without long-term current use of insulin ([name])  Hypotension, unspecified hypotension type  Gout of foot, unspecified cause, unspecified chronicity, unspecified laterality  Anemia of chronic disease  Deep vein thrombosis (DVT) of proximal lower extremity, unspecified chronicity, unspecified laterality ([place]) He is to stop the metoprolol due to his blood pressure. We will readdress the blood pressure medication with his next visit. Will also do blood work to reevaluate blood count, liver function and kidney function. Hopefully we can restart his simvastatin. Approximately one half hour spent discussing his care and follow-up with him and his wife.

## 2015-12-10 NOTE — Patient Instructions (Signed)
Hold the metoprolol. Back here in 2 weeks and will do blood work on the other medications. If any questions call me

## 2015-12-23 ENCOUNTER — Ambulatory Visit (INDEPENDENT_AMBULATORY_CARE_PROVIDER_SITE_OTHER): Payer: Medicare Other | Admitting: Family Medicine

## 2015-12-23 ENCOUNTER — Encounter: Payer: Self-pay | Admitting: Family Medicine

## 2015-12-23 VITALS — BP 124/50 | HR 65 | Wt 213.0 lb

## 2015-12-23 DIAGNOSIS — D638 Anemia in other chronic diseases classified elsewhere: Secondary | ICD-10-CM

## 2015-12-23 DIAGNOSIS — K7689 Other specified diseases of liver: Secondary | ICD-10-CM

## 2015-12-23 DIAGNOSIS — R945 Abnormal results of liver function studies: Secondary | ICD-10-CM

## 2015-12-23 DIAGNOSIS — E119 Type 2 diabetes mellitus without complications: Secondary | ICD-10-CM

## 2015-12-23 LAB — COMPREHENSIVE METABOLIC PANEL
ALT: 22 U/L (ref 9–46)
AST: 24 U/L (ref 10–35)
Albumin: 3 g/dL — ABNORMAL LOW (ref 3.6–5.1)
Alkaline Phosphatase: 71 U/L (ref 40–115)
BUN: 23 mg/dL (ref 7–25)
CO2: 24 mmol/L (ref 20–31)
Calcium: 8.6 mg/dL (ref 8.6–10.3)
Chloride: 105 mmol/L (ref 98–110)
Creat: 1.14 mg/dL (ref 0.70–1.18)
Glucose, Bld: 132 mg/dL — ABNORMAL HIGH (ref 65–99)
Potassium: 4.3 mmol/L (ref 3.5–5.3)
Sodium: 136 mmol/L (ref 135–146)
Total Bilirubin: 0.6 mg/dL (ref 0.2–1.2)
Total Protein: 6.1 g/dL (ref 6.1–8.1)

## 2015-12-23 LAB — CBC WITH DIFFERENTIAL/PLATELET
Basophils Absolute: 0 cells/uL (ref 0–200)
Basophils Relative: 0 %
Eosinophils Absolute: 78 cells/uL (ref 15–500)
Eosinophils Relative: 1 %
HCT: 26.1 % — ABNORMAL LOW (ref 38.5–50.0)
Hemoglobin: 8.7 g/dL — ABNORMAL LOW (ref 13.2–17.1)
Lymphocytes Relative: 35 %
Lymphs Abs: 2730 cells/uL (ref 850–3900)
MCH: 31.2 pg (ref 27.0–33.0)
MCHC: 33.3 g/dL (ref 32.0–36.0)
MCV: 93.5 fL (ref 80.0–100.0)
MPV: 10 fL (ref 7.5–12.5)
Monocytes Absolute: 624 cells/uL (ref 200–950)
Monocytes Relative: 8 %
Neutro Abs: 4368 cells/uL (ref 1500–7800)
Neutrophils Relative %: 56 %
Platelets: 249 10*3/uL (ref 140–400)
RBC: 2.79 MIL/uL — ABNORMAL LOW (ref 4.20–5.80)
RDW: 17.6 % — ABNORMAL HIGH (ref 11.0–15.0)
WBC: 7.8 10*3/uL (ref 4.0–10.5)

## 2015-12-23 NOTE — Progress Notes (Signed)
   Subjective:    Patient ID: Paul [name], male    DOB: [birth year], 75 y.o.   MRN: [id number]  HPI He is here for recheck. On his last visit the metoprolol was held. He has been checking his blood sugars well his blood pressure and the blood sugars in the morning look really good. Review of his previous blood work did show elevated liver enzymes as well as a low hemoglobin.   Review of Systems     Objective:   Physical Exam alert and in no distress with no complaints.      Assessment & Plan:  Controlled type 2 diabetes mellitus without complication, without long-term current use of insulin ([name])  Anemia of chronic disease - Plan: CBC with Differential/Platelet  Abnormal liver function - Plan: Comprehensive metabolic panel Blood pressure in general is in a much better range . The blood sugars look quite good. I will check blood work to reassess this.

## 2015-12-30 ENCOUNTER — Telehealth: Payer: Self-pay | Admitting: Family Medicine

## 2015-12-30 NOTE — Telephone Encounter (Signed)
Pt called and states that he is having some dark almost black stools, states it has been going on for almost a a week and a half, pt would like to know what he needs to do, please advise pt can be reached at [phone]

## 2015-12-31 NOTE — Telephone Encounter (Signed)
Spoke with pt- he is not taking Pepto- Bismol. Offered pt appt today and he declined.  Appt has been scheduled for 01/01/2016 with Audelia Acton for further eval. Victorino December

## 2015-12-31 NOTE — Telephone Encounter (Signed)
See if he has been taking Pepto-Bismol and this so I'm not worried. If he has not been taking it schedule him for an appointment soon

## 2016-01-01 ENCOUNTER — Encounter: Payer: Self-pay | Admitting: Medical

## 2016-01-01 ENCOUNTER — Ambulatory Visit (INDEPENDENT_AMBULATORY_CARE_PROVIDER_SITE_OTHER): Payer: Medicare Other | Admitting: Medical

## 2016-01-01 ENCOUNTER — Other Ambulatory Visit: Payer: Self-pay | Admitting: Medical

## 2016-01-01 VITALS — BP 140/42 | HR 70 | Wt 212.4 lb

## 2016-01-01 DIAGNOSIS — K921 Melena: Secondary | ICD-10-CM | POA: Diagnosis not present

## 2016-01-01 DIAGNOSIS — Z7901 Long term (current) use of anticoagulants: Secondary | ICD-10-CM

## 2016-01-01 DIAGNOSIS — D6959 Other secondary thrombocytopenia: Secondary | ICD-10-CM | POA: Diagnosis not present

## 2016-01-01 DIAGNOSIS — I82491 Acute embolism and thrombosis of other specified deep vein of right lower extremity: Secondary | ICD-10-CM

## 2016-01-01 DIAGNOSIS — I2510 Atherosclerotic heart disease of native coronary artery without angina pectoris: Secondary | ICD-10-CM | POA: Diagnosis not present

## 2016-01-01 DIAGNOSIS — R5382 Chronic fatigue, unspecified: Secondary | ICD-10-CM | POA: Diagnosis not present

## 2016-01-01 DIAGNOSIS — M25579 Pain in unspecified ankle and joints of unspecified foot: Secondary | ICD-10-CM

## 2016-01-01 DIAGNOSIS — D638 Anemia in other chronic diseases classified elsewhere: Secondary | ICD-10-CM | POA: Diagnosis not present

## 2016-01-01 DIAGNOSIS — M109 Gout, unspecified: Secondary | ICD-10-CM

## 2016-01-01 DIAGNOSIS — T50905A Adverse effect of unspecified drugs, medicaments and biological substances, initial encounter: Secondary | ICD-10-CM | POA: Diagnosis not present

## 2016-01-01 LAB — IRON AND TIBC
%SAT: 21 % (ref 15–60)
Iron: 51 ug/dL (ref 50–180)
TIBC: 245 ug/dL — ABNORMAL LOW (ref 250–425)
UIBC: 194 ug/dL (ref 125–400)

## 2016-01-01 LAB — CBC WITH DIFFERENTIAL/PLATELET
Basophils Absolute: 108 cells/uL (ref 0–200)
Basophils Relative: 1 %
Eosinophils Absolute: 216 cells/uL (ref 15–500)
Eosinophils Relative: 2 %
HCT: 27.9 % — ABNORMAL LOW (ref 38.5–50.0)
Hemoglobin: 9.2 g/dL — ABNORMAL LOW (ref 13.2–17.1)
Lymphocytes Relative: 32 %
Lymphs Abs: 3456 cells/uL (ref 850–3900)
MCH: 31.4 pg (ref 27.0–33.0)
MCHC: 33 g/dL (ref 32.0–36.0)
MCV: 95.2 fL (ref 80.0–100.0)
MPV: 9.6 fL (ref 7.5–12.5)
Monocytes Absolute: 972 cells/uL — ABNORMAL HIGH (ref 200–950)
Monocytes Relative: 9 %
Neutro Abs: 6048 cells/uL (ref 1500–7800)
Neutrophils Relative %: 56 %
Platelets: 204 10*3/uL (ref 140–400)
RBC: 2.93 MIL/uL — ABNORMAL LOW (ref 4.20–5.80)
RDW: 17.5 % — ABNORMAL HIGH (ref 11.0–15.0)
WBC: 10.8 10*3/uL — ABNORMAL HIGH (ref 4.0–10.5)

## 2016-01-01 LAB — FERRITIN: Ferritin: 121 ng/mL (ref 20–380)

## 2016-01-01 LAB — PROTIME-INR
INR: 1.1
Prothrombin Time: 11.2 s (ref 9.0–11.5)

## 2016-01-01 LAB — URIC ACID: Uric Acid, Serum: 8 mg/dL (ref 4.0–8.0)

## 2016-01-01 LAB — APTT: aPTT: 27 s (ref 22–34)

## 2016-01-01 MED ORDER — OMEPRAZOLE 40 MG PO CPDR: 40.0000 mg | DELAYED_RELEASE_CAPSULE | Freq: Every day | ORAL | 1 refills | Status: DC

## 2016-01-01 NOTE — Progress Notes (Signed)
Subjective: Chief Complaint  Patient presents with  . dark stools    dark stools x 2 weeks   Here accompanied by his sister.  Mainly here for black stools.   He has history significant for recent DVT right leg, AAA, arthritis, CAD, diabetes with neuropathy, hypertension, hyperlipidemia, thrombocytopenia, vit D deficiency.  Here for dark stools and other concerns.  For the last 6  Months his "blood has been screwed up."  Bp was low recently, was hospitalized for this and low blood counts.    For months he reports no energy, doesn't feel like doing anything.  The only places he has been for the past few moths is here and the hospital.  Has had black stools for 2 weeks.  Stool has been a little loose, sinks to the bottom.   No blood seen.  No vomiting, no nausea, no abdominal pain.   No hematemesis.   No fever.  He was 230lb months ago before all this started, so has lost some weight.   Last colonoscopy 2 years ago.  No prior EGD.  Taking Aspirin 81mg  daily.   On Xarelto 20mg , just went to the 20mg  recently.   Did the 15mg  BID starter initially.  Was changed to Xarelto with his recent hospitalization.  At his recent hospital 3 weeks ago, 5 medications were discontinued, and 2 were cut in half due to low BP.    Taking iron/ferrous sulfate 325mg  once daily x 6 months.  Takes Advil 2-3 times per week, OTC dose for bilateral ankle pain.  No particular LE swelling, no recent fall or injury.   Doesn't eat a lot of spicy or acidi foods, no alcohol.  He notes hx/o ulcer back in his 54s.    Past Medical History:  Diagnosis Date  . AAA (abdominal aortic aneurysm) ([place])   . Abnormal LFTs   . AKI (acute kidney injury) ([place])   . Anemia of chronic disease   . Arthritis    "all over" (12/04/2015)  . Atherosclerosis of native coronary artery of native heart without angina pectoris   . CAD (coronary artery disease)    with cath 09/26/08 showing severe heart disease in RCA with POBA, mild to mod diesease in  circ and LAD  . Chronic back pain    back problems prior to surgery   . Constipation   . Coronary atherosclerosis of native coronary artery   . Dehydration   . Diabetes mellitus    with neuropathy  . Diarrhea   . Elevated liver enzymes   . Generalized weakness   . Gout of right hand   . Hiatal hernia   . History of gastroesophageal reflux (GERD)   . History of stomach ulcers   . Hyperlipidemia   . Hypertension   . Metabolic acidosis   . Thrombocytopenia ([place])   . Vitamin B12 deficiency   . Vitamin B12 deficiency   . Vitamin D deficiency   . Weakness    Current Outpatient Prescriptions on File Prior to Visit  Medication Sig Dispense Refill  . ACCU-CHEK AVIVA PLUS test strip Test once to twice daily 200 each 4  . aspirin 81 MG chewable tablet Chew 81 mg by mouth daily.    . ferrous sulfate 325 (65 FE) MG tablet Take 325 mg by mouth daily with breakfast.    . fish oil-omega-3 fatty acids 1000 MG capsule Take 1 g by mouth 2 (two) times daily.     . isosorbide mononitrate (IMDUR) 30  MG 24 hr tablet Take 1 tablet (30 mg total) by mouth daily. 30 tablet 3  . metFORMIN (GLUCOPHAGE-XR) 500 MG 24 hr tablet Take 2 tablets by mouth  daily (Patient taking differently: Take 1 tablets by mouth twice daily) 180 tablet 1  . metoprolol tartrate (LOPRESSOR) 25 MG tablet Take 0.5 tablets (12.5 mg total) by mouth 2 (two) times daily. 30 tablet 3  . Multiple Vitamins-Minerals (CENTRUM) tablet Take 1 tablet by mouth daily.    . nitroGLYCERIN (NITROSTAT) 0.4 MG SL tablet Place 0.4 mg under the tongue every 5 (five) minutes as needed for chest pain. For 3 doses    . Rivaroxaban (XARELTO STARTER PACK) 15 & 20 MG TBPK Take as directed on package: Start with one 15mg  tablet by mouth twice a day with food. On Day 22, switch to one 20mg  tablet once a day with food. 51 each 0  . vitamin B-12 (CYANOCOBALAMIN) 1000 MCG tablet Take 1 tablet (1,000 mcg total) by mouth daily. 90 tablet 3   No current  facility-administered medications on file prior to visit.    ROS as in subjective  Objective: BP (!) 140/42 (BP Location: Right Arm)   Pulse 70   Wt 212 lb 6.4 oz (96.3 kg)   SpO2 97%   BMI 30.48 kg/m   Wt Readings from Last 3 Encounters:  01/01/16 212 lb 6.4 oz (96.3 kg)  12/23/15 213 lb (96.6 kg)  12/10/15 211 lb 6.4 oz (95.9 kg)   BP Readings from Last 3 Encounters:  01/01/16 (!) 140/42  12/23/15 (!) 124/50  12/10/15 (!) 100/40   General appearance: alert, no distress, WD/WN, white male HEENT: normocephalic, sclerae anicteric, TMs pearly, nares patent, no discharge or erythema, pharynx normal Oral cavity: MMM, no lesions Neck: supple, no lymphadenopathy, no thyromegaly, no masses Heart: RRR, normal S1, S2, no murmurs Lungs: CTA bilaterally, no wheezes, rhonchi, or rales Abdomen: +bs, soft, non tender, non distended, no masses, no hepatomegaly, no splenomegaly Pulses: 2+ symmetric, upper and lower extremities, normal cap refill Ext: right lower leg with slight asymmetry of calve compared to left, otherwise no obvious edema of LE, mild bilat LE varicose veins Ankles and legs nontender, but there seems to be some mild decrease of ROM of both ankles.   Neuro: nonfocal exam    Assessment: Encounter Diagnoses  Name Primary?  . Black stool Yes  . Acute deep vein thrombosis (DVT) of other specified vein of right lower extremity ([place])   . Long term current use of anticoagulant therapy   . Atherosclerosis of native coronary artery of native heart without angina pectoris   . Thrombocytopenia due to drugs   . Anemia of chronic disease   . Gout of foot, unspecified cause, unspecified chronicity, unspecified laterality   . Chronic fatigue   . Pain in joint involving ankle and foot, unspecified laterality    Plan: Black stool - orthostatics without worrisome findings today.   He has been using NSAID while on xarelto recently in the time frame he has seen the black stool.  He  could have stress ulcer given the recent hospitalization and health concerns.   He is on anticoagulation, is anemia, and has had low platelets recently, but in the hospital Allopurinol was discontinued due to likely causing the low platelets.     At this point we will get STAT labs to help decide if he has urgent concern, GI bleed.   Regarding fatigue - he has anemia, numerous health issues.  C/t iron daily, and he has been on this for months without dark stool.   C/t Aspirin 81mg  QHS for now.  Reviewed the recent EKG, echocardiogram from 12/05/15, recent labs from hospitalization.  Ankle pain, gout - uric acid level today, in the meantime can use tylenol but advised he take no more NSAID given risk with xarelto  DVT - c/t xarelto unless we advise otherwise  discussed significant of DVT and usually time to be on treatment with Xarelto or similar medication.  HTN - c/t Metoprolol 25mg  BID  At his hospitalization in 12/08/2015, Allopurinol, Plavix, Colchicine, Diclofenac, Accupril and Zocor were stopped.  Alexi was seen today for dark stools.  Diagnoses and all orders for this visit:  Black stool -     Orthostatic vital signs -     CBC with Differential/Platelet -     APTT -     Protime-INR  Acute deep vein thrombosis (DVT) of other specified vein of right lower extremity ([name])  Long term current use of anticoagulant therapy  Atherosclerosis of native coronary artery of native heart without angina pectoris  Thrombocytopenia due to drugs  Anemia of chronic disease -     CBC with Differential/Platelet -     Iron and TIBC -     Ferritin  Gout of foot, unspecified cause, unspecified chronicity, unspecified laterality -     Uric acid  Chronic fatigue  Pain in joint involving ankle and foot, unspecified laterality

## 2016-01-03 ENCOUNTER — Other Ambulatory Visit: Payer: Self-pay | Admitting: Family Medicine

## 2016-01-03 ENCOUNTER — Other Ambulatory Visit: Payer: Self-pay | Admitting: Cardiovascular Disease

## 2016-01-20 ENCOUNTER — Ambulatory Visit (INDEPENDENT_AMBULATORY_CARE_PROVIDER_SITE_OTHER): Payer: Medicare Other | Admitting: Family Medicine

## 2016-01-20 ENCOUNTER — Encounter: Payer: Self-pay | Admitting: Family Medicine

## 2016-01-20 ENCOUNTER — Ambulatory Visit
Admission: RE | Admit: 2016-01-20 | Discharge: 2016-01-20 | Disposition: A | Discharge status: Home / Self Care | Payer: Medicare Other | Source: Ambulatory Visit | Attending: Family Medicine | Admitting: Family Medicine

## 2016-01-20 VITALS — BP 110/44 | HR 61 | Resp 12 | Ht 70.0 in | Wt 212.0 lb

## 2016-01-20 DIAGNOSIS — T50905A Adverse effect of unspecified drugs, medicaments and biological substances, initial encounter: Secondary | ICD-10-CM

## 2016-01-20 DIAGNOSIS — M25579 Pain in unspecified ankle and joints of unspecified foot: Secondary | ICD-10-CM | POA: Diagnosis not present

## 2016-01-20 DIAGNOSIS — K921 Melena: Secondary | ICD-10-CM | POA: Diagnosis not present

## 2016-01-20 DIAGNOSIS — I82411 Acute embolism and thrombosis of right femoral vein: Secondary | ICD-10-CM

## 2016-01-20 DIAGNOSIS — M7989 Other specified soft tissue disorders: Secondary | ICD-10-CM | POA: Diagnosis not present

## 2016-01-20 DIAGNOSIS — M109 Gout, unspecified: Secondary | ICD-10-CM

## 2016-01-20 DIAGNOSIS — M25571 Pain in right ankle and joints of right foot: Secondary | ICD-10-CM | POA: Diagnosis not present

## 2016-01-20 DIAGNOSIS — D6959 Other secondary thrombocytopenia: Secondary | ICD-10-CM | POA: Diagnosis not present

## 2016-01-20 DIAGNOSIS — D638 Anemia in other chronic diseases classified elsewhere: Secondary | ICD-10-CM | POA: Diagnosis not present

## 2016-01-20 DIAGNOSIS — I82491 Acute embolism and thrombosis of other specified deep vein of right lower extremity: Secondary | ICD-10-CM

## 2016-01-20 DIAGNOSIS — M25572 Pain in left ankle and joints of left foot: Secondary | ICD-10-CM | POA: Diagnosis not present

## 2016-01-20 LAB — CBC WITH DIFFERENTIAL/PLATELET
Basophils Absolute: 0 cells/uL (ref 0–200)
Basophils Relative: 0 %
Eosinophils Absolute: 168 cells/uL (ref 15–500)
Eosinophils Relative: 2 %
HCT: 27.3 % — ABNORMAL LOW (ref 38.5–50.0)
Hemoglobin: 9 g/dL — ABNORMAL LOW (ref 13.2–17.1)
Lymphocytes Relative: 35 %
Lymphs Abs: 2940 cells/uL (ref 850–3900)
MCH: 30.6 pg (ref 27.0–33.0)
MCHC: 33 g/dL (ref 32.0–36.0)
MCV: 92.9 fL (ref 80.0–100.0)
MPV: 9.9 fL (ref 7.5–12.5)
Monocytes Absolute: 504 cells/uL (ref 200–950)
Monocytes Relative: 6 %
Neutro Abs: 4788 cells/uL (ref 1500–7800)
Neutrophils Relative %: 57 %
Platelets: 237 10*3/uL (ref 140–400)
RBC: 2.94 MIL/uL — ABNORMAL LOW (ref 4.20–5.80)
RDW: 17.3 % — ABNORMAL HIGH (ref 11.0–15.0)
WBC: 8.4 10*3/uL (ref 4.0–10.5)

## 2016-01-20 NOTE — Progress Notes (Addendum)
   Subjective:    Patient ID: Paul [name], male    DOB: 11-22-1940, 75 y.o.   MRN: [id number]  HPI He is here for a recheck. He states that he is no longer having dark stools. He is not taking Advil. He does continue on Xarelto. He has had difficulty with his blood studies in the past and is scheduled to see [name] in January. He does have a previous history of gout however his allopurinol was stopped due to possible effects on platelets. He continues to complain of over a one-year history of bilateral ankle pain and swelling to the extent that it keeps him from ambulating.   Review of Systems     Objective:   Physical Exam Alert and in no distress. He did bring his blood pressure and blood sugar readings in. Both were within good range. Bilateral ankle exam shows some ankle mortise edema. The right ankle shows striking decrease in range of motion with pain. Left ankle has much better motion but again edema is noted.       Assessment & Plan:  Black stool - Plan: CBC with Differential/Platelet  Acute deep vein thrombosis (DVT) of other specified vein of right lower extremity ([name])  Thrombocytopenia due to drugs - Plan: CBC with Differential/Platelet  Anemia of chronic disease - Plan: CBC with Differential/Platelet  Gout of foot, unspecified cause, unspecified chronicity, unspecified laterality - Plan: Uric Acid  Pain in joint involving ankle and foot, unspecified laterality - Plan: DG Ankle 2 Views Right, DG Ankle Complete Left He will continue on his present medication regimen. I will check his CBC and also get x-rays on his ankle. I think this is probably gout related arthritic changes The uric acid and x-rays didn't help much. In the past he had taken cultures seen which did seem to help. Since he has had difficulty with allopurinol I will have him start back on the colchicine and see if this will quiet down his ankle symptoms.

## 2016-01-21 LAB — URIC ACID: Uric Acid, Serum: 7.7 mg/dL (ref 4.0–8.0)

## 2016-02-06 ENCOUNTER — Encounter: Payer: Self-pay | Admitting: Cardiovascular Disease

## 2016-02-06 ENCOUNTER — Ambulatory Visit (INDEPENDENT_AMBULATORY_CARE_PROVIDER_SITE_OTHER): Payer: Medicare Other | Admitting: Cardiovascular Disease

## 2016-02-06 VITALS — BP 140/60 | HR 81 | Ht 70.0 in | Wt 211.8 lb

## 2016-02-06 DIAGNOSIS — I10 Essential (primary) hypertension: Secondary | ICD-10-CM

## 2016-02-06 DIAGNOSIS — I739 Peripheral vascular disease, unspecified: Principal | ICD-10-CM

## 2016-02-06 DIAGNOSIS — I2510 Atherosclerotic heart disease of native coronary artery without angina pectoris: Secondary | ICD-10-CM

## 2016-02-06 DIAGNOSIS — E785 Hyperlipidemia, unspecified: Secondary | ICD-10-CM

## 2016-02-06 DIAGNOSIS — I779 Disorder of arteries and arterioles, unspecified: Secondary | ICD-10-CM

## 2016-02-06 MED ORDER — METOPROLOL TARTRATE 25 MG PO TABS: 25.0000 mg | ORAL_TABLET | Freq: Two times a day (BID) | ORAL | 3 refills | Status: DC

## 2016-02-06 NOTE — Patient Instructions (Signed)
Medication Instructions:  Your physician has recommended you make the following change in your medication:  Increase metoprolol tartrate to 25 mg by mouth twice daily.   Labwork: Lab work to be don today--Liver function test  Testing/Procedures: Your physician has requested that you have a carotid duplex. This test is an ultrasound of the carotid arteries in your neck. It looks at blood flow through these arteries that supply the brain with blood. Allow one hour for this exam. There are no restrictions or special instructions. To be done in July  Follow-Up: Your physician recommends that you schedule a follow-up appointment in: 6 months.  Please call our office in March to schedule this appointment.     Any Other Special Instructions Will Be Listed Below (If Applicable).     If you need a refill on your cardiac medications before your next appointment, please call your pharmacy.

## 2016-02-06 NOTE — Progress Notes (Signed)
Chief Complaint  Patient presents with  . 6 month f/u     History of Present Illness: 76 yo WM with history of CAD, DM, bilateral carotid artery disease, HTN, HLD, DVT here today for cardiology follow up. His echo in 2009 showed mild LVH and normal LV function with no significant valvular abnormalities. His cath iin 2009 showed a near total occlusion of the RCA beyond the stent in the mid portion. I attempted PCI and was able to balloon the mid lesion but could never pass a balloon distally. He did well after that.  I saw him December 2013 and he c/o chest tightness with minimal exertion associated with SOB. He has noticed his heart pounding but it has not been racing. I arranged a left heart cath on 01/21/12. He was found to have a total occlusion of the RCA which was not amenable to PCI (PCI attempted in 2009). The LAD and Circumflex had moderate disease. He has done well since then. Normal ABI 10/01/13. Carotid artery dopplers July 2017 with moderate bilateral ICA stenosis.  Admitted to [facility] November 2017 with a DVT. Echo November 2017 with normal LV systolic function, moderate AI. Lipitor stopped due to elevated LFTs.   He is here today for planned follow up. Denies chest pain or SOB. He is feeling well overall. He is having no bleeding issues on Xarelto.   Primary Care Physician: Wyatt Haste, MD   Past Medical History:  Diagnosis Date  . AAA (abdominal aortic aneurysm) ([place])   . Abnormal LFTs   . AKI (acute kidney injury) ([place])   . Anemia of chronic disease   . Arthritis    "all over" (12/04/2015)  . Atherosclerosis of native coronary artery of native heart without angina pectoris   . CAD (coronary artery disease)    with cath 09/26/08 showing severe heart disease in RCA with POBA, mild to mod diesease in circ and LAD  . Chronic back pain    back problems prior to surgery   . Constipation   . Coronary atherosclerosis of native coronary artery   . Dehydration   . Diabetes  mellitus    with neuropathy  . Diarrhea   . Elevated liver enzymes   . Generalized weakness   . Gout of right hand   . Hiatal hernia   . History of gastroesophageal reflux (GERD)   . History of stomach ulcers   . Hyperlipidemia   . Hypertension   . Metabolic acidosis   . Thrombocytopenia ([place])   . Vitamin B12 deficiency   . Vitamin B12 deficiency   . Vitamin D deficiency   . Weakness     Past Surgical History:  Procedure Laterality Date  . ANKLE FRACTURE SURGERY Left    "crushed"  . BACK SURGERY  X 3   "fell 4 stories in 1977"  . CARPAL TUNNEL RELEASE Left   . CORONARY ANGIOPLASTY WITH STENT PLACEMENT  09/26/2008   [name] 06/03/2010  . FRACTURE SURGERY    . LEFT HEART CATHETERIZATION WITH CORONARY ANGIOGRAM N/A 01/21/2012   Procedure: LEFT HEART CATHETERIZATION WITH CORONARY ANGIOGRAM;  Surgeon: Burnell Blanks, MD;  Location: [facility] CATH LAB;  Service: Cardiovascular;  Laterality: N/A;  . LUMBAR [place]; ~ 1980  . POSTERIOR LUMBAR FUSION  ~ 1998   "3rd OR to reconstruct my back after fall in 1977"  . 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 ACCU-CHEK AVIVA PLUS test strip Test once to twice daily 200 each 4  . aspirin 81 MG chewable tablet Chew 81 mg by mouth daily.    . ferrous sulfate 325 (65 FE) MG tablet Take 325 mg by mouth daily with breakfast.    . fish oil-omega-3 fatty acids 1000 MG capsule Take 1 g by mouth 2 (two) times daily.     . isosorbide mononitrate (IMDUR) 30 MG 24 hr tablet TAKE 1 TABLET BY MOUTH  TWICE A DAY 180 tablet 0  . metFORMIN (GLUCOPHAGE-XR) 500 MG 24 hr tablet TAKE 2 TABLETS BY MOUTH  DAILY 180 tablet 1  . Multiple Vitamins-Minerals (CENTRUM) tablet Take 1 tablet by mouth daily.    . nitroGLYCERIN (NITROSTAT) 0.4 MG SL tablet Place 0.4 mg under the tongue every 5 (five) minutes as needed for chest pain. For 3 doses    . omeprazole (PRILOSEC) 40 MG capsule TAKE 1  CAPSULE(40 MG) BY MOUTH DAILY 90 capsule 1  . rivaroxaban (XARELTO) 20 MG TABS tablet Take 20 mg by mouth daily with supper.    . vitamin B-12 (CYANOCOBALAMIN) 1000 MCG tablet Take 1 tablet (1,000 mcg total) by mouth daily. 90 tablet 3  . metoprolol tartrate (LOPRESSOR) 25 MG tablet Take 1 tablet (25 mg total) by mouth 2 (two) times daily. 180 tablet 3   No current facility-administered medications for this visit.     Allergies  Allergen Reactions  . Allopurinol Other (See Comments)    thrombocytopenia    Social History   Social History  . Marital status: Married    Spouse name: N/A  . Number of children: N/A  . Years of education: N/A   Occupational History  . Not on file.   Social History Main Topics  . Smoking status: Former Smoker    Years: 20.00    Types: Cigars    Quit date: 01/24/2013  . Smokeless tobacco: Never Used  . Alcohol use Yes     Comment: 12/04/2015 "nothing in 3-4 years"  . Drug use: No  . Sexual activity: Yes   Other Topics Concern  . Not on file   Social History Narrative   Divorced, 1 child.    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    Owned roofing co. In [place]    Family History  Problem Relation Age of Onset  . Heart attack Mother   . Cancer Mother   . Heart attack Sister     Review of Systems:  As stated in the HPI and otherwise negative.   BP 140/60   Pulse 81   Ht 5\' 10"  (1.778 m)   Wt 211 lb 12.8 oz (96.1 kg)   SpO2 96%   BMI 30.39 kg/m   Physical Examination: General: Well developed, well nourished, NAD  HEENT: OP clear, mucus membranes moist  SKIN: warm, dry. No rashes. Neuro: No focal deficits  Musculoskeletal: Muscle strength 5/5 all ext  Psychiatric: Mood and affect normal  Neck: No JVD, no carotid bruits, no thyromegaly, no lymphadenopathy.  Lungs:Clear bilaterally, no wheezes, rhonci, crackles Cardiovascular: Regular rate and rhythm. No murmurs, gallops or rubs. Abdomen:Soft. Bowel  sounds present. Non-tender.  Extremities: Mild bilateral lower extremity edema. Pulses are 2 + in the bilateral DP/PT.  Echo November 2017: Left ventricle: The cavity size was mildly dilated. Systolic   function was normal. The estimated ejection fraction was in the   range of 60% to 65%. Wall motion  was normal; there were no   regional wall motion abnormalities. There was an increased   relative contribution of atrial contraction to ventricular   filling. Doppler parameters are consistent with abnormal left   ventricular relaxation (grade 1 diastolic dysfunction). - Aortic valve: Trileaflet; normal thickness, mildly calcified   leaflets. There was moderate regurgitation. - Left atrium: The atrium was moderately dilated. - Atrial septum: There was increased thickness of the septum,   consistent with lipomatous hypertrophy. - Pulmonary arteries: PA peak pressure: 36 mm Hg (S). - Impressions: The right ventricular systolic pressure was   increased consistent with mild pulmonary hypertension.  Impressions:  - The right ventricular systolic pressure was increased consistent   with mild pulmonary hypertension.  EKG:  EKG is not ordered today. The ekg ordered today demonstrates   Recent Labs: 04/21/2015: TSH 0.438 12/06/2015: B Natriuretic Peptide 155.6 12/07/2015: Magnesium 1.8 12/23/2015: ALT 22; BUN 23; Creat 1.14; Potassium 4.3; Sodium 136 01/20/2016: Hemoglobin 9.0; Platelets 237   Lipid Panel    Component Value Date/Time   CHOL 107 (L) 03/03/2015 0001   TRIG 133 03/03/2015 0001   HDL 30 (L) 03/03/2015 0001   CHOLHDL 3.6 03/03/2015 0001   VLDL 27 03/03/2015 0001   LDLCALC 50 03/03/2015 0001     Wt Readings from Last 3 Encounters:  02/06/16 211 lb 12.8 oz (96.1 kg)  01/20/16 212 lb (96.2 kg)  01/01/16 212 lb 6.4 oz (96.3 kg)     Other studies Reviewed: Additional studies/ records that were reviewed today include: . Review of the above records demonstrates:     Assessment and Plan:   1. CAD: Stable angina. His RCA is occluded distally, not amenable to PCI. The LAD and Circumflex have moderate disease by cath 2013. He is now on ASA, beta blocker, Imdur. Off of Plavix since he is on Xarelto for DVT. He is off of the statin due to elevated LFTs Continue current meds. Will check LFTs today and if ok will restart statin.   2. HTN: BP slightly elevated today. Will increase metoprolol to 25 mg po BID.   3. Carotid artery disease: He has moderate bilateral disease which is stable by dopplers July 2017.  Repeat July 2018.   4. Hyperlipidemia: Lipids well controlled. Now off of the statin. Repeat LFTs today. If ok, restart Zocor but at 20 mg daily.   5. DVT: He is on Xarelto per primary care.   Current medicines are reviewed at length with the patient today.  The patient does not have concerns regarding medicines.  The following changes have been made:  no change  Labs/ tests ordered today include:   Orders Placed This Encounter  Procedures  . Hepatic function panel    Disposition:   FU with me in 6 months  Signed, Lauree Chandler, MD 02/06/2016 10:38 AM    [facility] [address], [place], [place]  [address] Phone: [phone]; Fax: [phone]

## 2016-02-07 LAB — HEPATIC FUNCTION PANEL
ALT: 27 IU/L (ref 0–44)
AST: 27 IU/L (ref 0–40)
Albumin: 3.3 g/dL — ABNORMAL LOW (ref 3.5–4.8)
Alkaline Phosphatase: 92 IU/L (ref 39–117)
Bilirubin Total: 0.8 mg/dL (ref 0.0–1.2)
Bilirubin, Direct: 0.28 mg/dL (ref 0.00–0.40)
Total Protein: 6.8 g/dL (ref 6.0–8.5)

## 2016-02-09 ENCOUNTER — Other Ambulatory Visit: Payer: Self-pay | Admitting: *Deleted

## 2016-02-09 DIAGNOSIS — E7849 Other hyperlipidemia: Secondary | ICD-10-CM

## 2016-02-09 MED ORDER — SIMVASTATIN 20 MG PO TABS: 20.0000 mg | ORAL_TABLET | Freq: Every day | ORAL | 3 refills | Status: DC

## 2016-02-22 ENCOUNTER — Emergency Department (HOSPITAL_COMMUNITY)
Admission: EM | Admit: 2016-02-22 | Discharge: 2016-02-22 | Disposition: A | Discharge status: Home / Self Care | Payer: Medicare Other | Attending: Emergency Medicine | Admitting: Emergency Medicine

## 2016-02-22 ENCOUNTER — Encounter (HOSPITAL_COMMUNITY): Payer: Self-pay | Admitting: Emergency Medicine

## 2016-02-22 DIAGNOSIS — Z7982 Long term (current) use of aspirin: Secondary | ICD-10-CM | POA: Diagnosis not present

## 2016-02-22 DIAGNOSIS — E1122 Type 2 diabetes mellitus with diabetic chronic kidney disease: Secondary | ICD-10-CM | POA: Diagnosis not present

## 2016-02-22 DIAGNOSIS — I129 Hypertensive chronic kidney disease with stage 1 through stage 4 chronic kidney disease, or unspecified chronic kidney disease: Secondary | ICD-10-CM | POA: Diagnosis not present

## 2016-02-22 DIAGNOSIS — Z7984 Long term (current) use of oral hypoglycemic drugs: Secondary | ICD-10-CM | POA: Insufficient documentation

## 2016-02-22 DIAGNOSIS — M109 Gout, unspecified: Secondary | ICD-10-CM | POA: Diagnosis not present

## 2016-02-22 DIAGNOSIS — Z7901 Long term (current) use of anticoagulants: Secondary | ICD-10-CM | POA: Insufficient documentation

## 2016-02-22 DIAGNOSIS — N189 Chronic kidney disease, unspecified: Secondary | ICD-10-CM | POA: Diagnosis not present

## 2016-02-22 DIAGNOSIS — Z955 Presence of coronary angioplasty implant and graft: Secondary | ICD-10-CM | POA: Diagnosis not present

## 2016-02-22 DIAGNOSIS — Z87891 Personal history of nicotine dependence: Secondary | ICD-10-CM | POA: Diagnosis not present

## 2016-02-22 DIAGNOSIS — I2510 Atherosclerotic heart disease of native coronary artery without angina pectoris: Secondary | ICD-10-CM | POA: Diagnosis not present

## 2016-02-22 MED ORDER — HYDROCODONE-ACETAMINOPHEN 5-325 MG PO TABS: 1.0000 | ORAL_TABLET | Freq: Four times a day (QID) | ORAL | 0 refills | Status: DC | PRN

## 2016-02-22 MED ORDER — HYDROCODONE-ACETAMINOPHEN 5-325 MG PO TABS
1.0000 | ORAL_TABLET | Freq: Once | ORAL | Status: AC
  Administered 2016-02-22: 1 via ORAL
  Filled 2016-02-22: qty 1

## 2016-02-22 NOTE — ED Triage Notes (Signed)
Patient stated gout flare up this week with pain and joint swelling at bilateral ankles , fingers and left elbow unrelieved by prescription medications , denies fever or chills.

## 2016-02-22 NOTE — ED Provider Notes (Signed)
[facility] DEPT Provider Note   CSN: [id number] Arrival date & time: 02/22/16  4098     History   Chief Complaint Chief Complaint  Patient presents with  . Gout    HPI Paul [name] is a 76 y.o. male.  Paul [name] is a 76 y.o. Male with a history of gout, diabetes, and chronic kidney disease, who presents to the emergency department complaining of a gout flare. Patient reports he's had gout flares for more than 20 years intermittently. He tells me he is having an acute gout flare for the past 4 days to his bilateral second and third fingers, his left elbow and his bilateral ankles. He tells me this feels just like his gout. He used colchicine without relief. No other treatments prior to arrival today. No other treatments attempted. He denies any fevers, rashes, numbness, tingling, weakness or traumatic injury to his joints.   The history is provided by the patient and medical records. No language interpreter was used.    Past Medical History:  Diagnosis Date  . AAA (abdominal aortic aneurysm) ([place])   . Abnormal LFTs   . AKI (acute kidney injury) ([place])   . Anemia of chronic disease   . Arthritis    "all over" (12/04/2015)  . Atherosclerosis of native coronary artery of native heart without angina pectoris   . CAD (coronary artery disease)    with cath 09/26/08 showing severe heart disease in RCA with POBA, mild to mod diesease in circ and LAD  . Chronic back pain    back problems prior to surgery   . Constipation   . Coronary atherosclerosis of native coronary artery   . Dehydration   . Diabetes mellitus    with neuropathy  . Diarrhea   . Elevated liver enzymes   . Generalized weakness   . Gout of right hand   . Hiatal hernia   . History of gastroesophageal reflux (GERD)   . History of stomach ulcers   . Hyperlipidemia   . Hypertension   . Metabolic acidosis   . Thrombocytopenia ([place])   . Vitamin B12 deficiency   . Vitamin B12 deficiency   . Vitamin  D deficiency   . Weakness     Patient Active Problem List   Diagnosis Date Noted  . SOB (shortness of breath)   . Hypomagnesemia 12/06/2015  . DVT of deep femoral vein, right ([place]) 12/06/2015  . Thrombocytopenia due to drugs   . Allergy status to unspecified drugs, medicaments and biological substances status   . Acute kidney injury ([place]) 12/04/2015  . AAA (abdominal aortic aneurysm) ([name])-infrarenal 3.4 cm 12/04/2015  . Transaminitis 12/04/2015  . Edema of right lower extremity 12/04/2015  . Orthostatic hypotension 12/04/2015  . Vitamin B12 deficiency 04/22/2015  . Controlled type 2 diabetes mellitus without complication, without long-term current use of insulin ([place]) 04/18/2015  . Thrombocytopenia ([place]) 04/18/2015  . Anemia of chronic disease 04/18/2015  . Abnormal liver function tests 04/18/2015  . Gout 07/22/2010  . Coronary atherosclerosis of native coronary artery 09/17/2008    Past Surgical History:  Procedure Laterality Date  . ANKLE FRACTURE SURGERY Left    "crushed"  . BACK SURGERY  X 3   "fell 4 stories in 1977"  . CARPAL TUNNEL RELEASE Left   . CORONARY ANGIOPLASTY WITH STENT PLACEMENT  09/26/2008   [name] 06/03/2010  . FRACTURE SURGERY    . LEFT HEART CATHETERIZATION WITH CORONARY ANGIOGRAM N/A 01/21/2012   Procedure: LEFT HEART  CATHETERIZATION WITH CORONARY ANGIOGRAM;  Surgeon: Burnell Blanks, MD;  Location: [facility] CATH LAB;  Service: Cardiovascular;  Laterality: N/A;  . LUMBAR [place]; ~ 1980  . POSTERIOR LUMBAR FUSION  ~ 1998   "3rd OR to reconstruct my back after fall in 1977"  . 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 and 2nd       Home Medications    Prior to Admission medications   Medication Sig Start Date End Date Taking? Authorizing Provider  ACCU-CHEK AVIVA PLUS test strip Test once to twice daily 06/20/15   Denita Lung, MD  aspirin 81 MG chewable tablet Chew 81 mg by mouth daily.    Historical Provider, MD  ferrous sulfate 325 (65 FE)  MG tablet Take 325 mg by mouth daily with breakfast.    Historical Provider, MD  fish oil-omega-3 fatty acids 1000 MG capsule Take 1 g by mouth 2 (two) times daily.     Historical Provider, MD  HYDROcodone-acetaminophen (NORCO/VICODIN) 5-325 MG tablet Take 1 tablet by mouth every 6 (six) hours as needed for moderate pain or severe pain. 02/22/16   Waynetta Pean, PA-C  isosorbide mononitrate (IMDUR) 30 MG 24 hr tablet TAKE 1 TABLET BY MOUTH  TWICE A DAY 01/05/16   Burnell Blanks, MD  metFORMIN (GLUCOPHAGE-XR) 500 MG 24 hr tablet TAKE 2 TABLETS BY MOUTH  DAILY 01/05/16   Denita Lung, MD  metoprolol tartrate (LOPRESSOR) 25 MG tablet Take 1 tablet (25 mg total) by mouth 2 (two) times daily. 02/06/16 05/06/16  Burnell Blanks, MD  Multiple Vitamins-Minerals (CENTRUM) tablet Take 1 tablet by mouth daily.    Historical Provider, MD  nitroGLYCERIN (NITROSTAT) 0.4 MG SL tablet Place 0.4 mg under the tongue every 5 (five) minutes as needed for chest pain. For 3 doses 10/12/11   Denita Lung, MD  omeprazole (PRILOSEC) 40 MG capsule TAKE 1 CAPSULE(40 MG) BY MOUTH DAILY 01/01/16   Denita Lung, MD  rivaroxaban (XARELTO) 20 MG TABS tablet Take 20 mg by mouth daily with supper.    Historical Provider, MD  simvastatin (ZOCOR) 20 MG tablet Take 1 tablet (20 mg total) by mouth at bedtime. 02/09/16 05/09/16  Burnell Blanks, MD  vitamin B-12 (CYANOCOBALAMIN) 1000 MCG tablet Take 1 tablet (1,000 mcg total) by mouth daily. 05/09/15   Denita Lung, MD    Family History Family History  Problem Relation Age of Onset  . Heart attack Mother   . Cancer Mother   . Heart attack Sister     Social History Social History  Substance Use Topics  . Smoking status: Former Smoker    Years: 20.00    Types: Cigars    Quit date: 01/24/2013  . Smokeless tobacco: Never Used  . Alcohol use Yes     Comment: 12/04/2015 "nothing in 3-4 years"     Allergies   Allopurinol   Review of Systems Review of  Systems  Constitutional: Negative for chills and fever.  Musculoskeletal: Positive for arthralgias.  Skin: Negative for rash and wound.  Neurological: Negative for weakness and numbness.     Physical Exam Updated Vital Signs BP 107/65 (BP Location: Right Arm)   Pulse 74   Temp 98.4 F (36.9 C) (Oral)   Resp 18   SpO2 100%   Physical Exam  Constitutional: He appears well-developed and well-nourished. No distress.  Nontoxic appearing.  HENT:  Head: Normocephalic and atraumatic.  Eyes: Right eye exhibits no discharge.  Left eye exhibits no discharge.  Cardiovascular: Normal rate, regular rhythm and intact distal pulses.   Pulmonary/Chest: Effort normal. No respiratory distress.  Musculoskeletal: Normal range of motion. He exhibits tenderness. He exhibits no edema or deformity.  Patient has tenderness and mild edema noted to his bilateral second and third finger joints, his left elbow and his bilateral ankles. No erythema. Good range of motion, albeit with some pain. No evidence of septic joint.  Neurological: He is alert. Coordination normal.  Skin: Skin is warm and dry. Capillary refill takes less than 2 seconds. No rash noted. He is not diaphoretic. No erythema. No pallor.  Psychiatric: He has a normal mood and affect. His behavior is normal.  Nursing note and vitals reviewed.    ED Treatments / Results  Labs (all labs ordered are listed, but only abnormal results are displayed) Labs Reviewed - No data to display  EKG  EKG Interpretation None       Radiology No results found.  Procedures Procedures (including critical care time)  Medications Ordered in ED Medications  HYDROcodone-acetaminophen (NORCO/VICODIN) 5-325 MG per tablet 1 tablet (not administered)     Initial Impression / Assessment and Plan / ED Course  I have reviewed the triage vital signs and the nursing notes.  Pertinent labs & imaging results that were available during my care of the patient  were reviewed by me and considered in my medical decision making (see chart for details).   This  is a 76 y.o. Male with a history of gout, diabetes, and chronic kidney disease, who presents to the emergency department complaining of a gout flare. Patient reports he's had gout flares for more than 20 years intermittently. He tells me he is having an acute gout flare for the past 4 days to his bilateral second and third fingers, his left elbow and his bilateral ankles. He tells me this feels just like his gout. He used colchicine without relief. No other treatments prior to arrival today. On exam the patient is afebrile and nontoxic appearing. He has tenderness noted to his bilateral second and third finger joints, his left wrist and his bilateral ankles. He has good range of motion. No evidence of septic joint. He is neurovascularly intact. Patient with acute gout flare. He is using colchicine as prescribed by his doctor. He has many contraindications to other treatments for gout. Will provide him with a short course of Vicodin until he can follow-up this week with his primary care doctor. I discussed pain medication precautions. The patient was looked up on the [place] controlled substance database. No recent prescriptions for benzodiazepines or narcotics. I advised the patient to follow-up with their primary care provider this week. I advised the patient to return to the emergency department with new or worsening symptoms or new concerns. The patient verbalized understanding and agreement with plan.      Final Clinical Impressions(s) / ED Diagnoses   Final diagnoses:  Gout, arthritis    New Prescriptions New Prescriptions   HYDROCODONE-ACETAMINOPHEN (NORCO/VICODIN) 5-325 MG TABLET    Take 1 tablet by mouth every 6 (six) hours as needed for moderate pain or severe pain.     Waynetta Pean, PA-C 02/22/16 [place], MD 02/22/16 [phone]

## 2016-02-23 ENCOUNTER — Encounter: Payer: Self-pay | Admitting: Oncology

## 2016-02-23 ENCOUNTER — Ambulatory Visit (INDEPENDENT_AMBULATORY_CARE_PROVIDER_SITE_OTHER): Payer: Medicare Other | Admitting: Oncology

## 2016-02-23 VITALS — BP 122/44 | HR 62 | Temp 97.8°F | Ht 70.0 in | Wt 208.0 lb

## 2016-02-23 DIAGNOSIS — Z888 Allergy status to other drugs, medicaments and biological substances status: Secondary | ICD-10-CM

## 2016-02-23 DIAGNOSIS — M1A0720 Idiopathic chronic gout, left ankle and foot, without tophus (tophi): Secondary | ICD-10-CM

## 2016-02-23 DIAGNOSIS — M1A0710 Idiopathic chronic gout, right ankle and foot, without tophus (tophi): Secondary | ICD-10-CM

## 2016-02-23 DIAGNOSIS — Z8619 Personal history of other infectious and parasitic diseases: Secondary | ICD-10-CM

## 2016-02-23 DIAGNOSIS — Z862 Personal history of diseases of the blood and blood-forming organs and certain disorders involving the immune mechanism: Secondary | ICD-10-CM

## 2016-02-23 DIAGNOSIS — Z7982 Long term (current) use of aspirin: Secondary | ICD-10-CM

## 2016-02-23 DIAGNOSIS — I82411 Acute embolism and thrombosis of right femoral vein: Secondary | ICD-10-CM

## 2016-02-23 DIAGNOSIS — M109 Gout, unspecified: Secondary | ICD-10-CM | POA: Diagnosis not present

## 2016-02-23 DIAGNOSIS — Z955 Presence of coronary angioplasty implant and graft: Secondary | ICD-10-CM

## 2016-02-23 DIAGNOSIS — I2510 Atherosclerotic heart disease of native coronary artery without angina pectoris: Secondary | ICD-10-CM

## 2016-02-23 DIAGNOSIS — I779 Disorder of arteries and arterioles, unspecified: Secondary | ICD-10-CM

## 2016-02-23 DIAGNOSIS — D649 Anemia, unspecified: Secondary | ICD-10-CM | POA: Diagnosis not present

## 2016-02-23 DIAGNOSIS — I739 Peripheral vascular disease, unspecified: Secondary | ICD-10-CM

## 2016-02-23 HISTORY — DX: Anemia, unspecified: D64.9

## 2016-02-23 NOTE — Patient Instructions (Signed)
To lab today You can stop Xarelto blood thinner at end of this month and go back on Plavix and use other anti-inflammatory meds like Motrin to control pain from the gout. Rheumatology referral to Dr Leigh Aurora Return visit 3 months

## 2016-02-24 NOTE — Progress Notes (Signed)
Hematology and Oncology Follow Up Visit  Paul [name] [id number] 01-08-1941 76 y.o. 02/24/2016 4:05 PM   Principle Diagnosis: Encounter Diagnoses  Name Primary?  Marland Kitchen DVT of deep femoral vein, right ([place])   . Normochromic anemia   . Acute gout of ankle, unspecified cause, unspecified laterality Yes  . 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 Please see my November 4 consultation for complete details. I was asked to evaluate him for an acute right lower extremity DVT Which occurred at the time of an acute gout attack. Additional problems identified on my evaluation were thrombocytopenia and acute hepatitis.  He was on allopurinol and colchicine for long-standing gout. He had a prior hospitalization for acute thrombocytopenia and concomitant hepatitis likely related to  allopurinol. Subsequent to discharge, he was rechallenged with the allopurinol and it seemed clear when I evaluated his course that he was having a medication reaction to this drug. He was on other medications that could affect platelets including quinapril. Both the thrombocytopenia and the hepatitis completely resolved when he stopped the allopurinol. I felt that his acute right lower extremity DVT was a provoked event with a combination of inflammatory response to an acute gout attack and an acute hepatitis/medication reaction. CT angiogram did not show any pulmonary emboli. He was started on Xarelto anticoagulation. He was on aspirin, Plavix, and diclofenac prior to the clot in view of coronary artery disease, previous right coronary stent placement and subsequent stent occlusion.. I advised stopping the Plavix and diclofenac and continuing the Low-dose aspirin while on Xarelto.  Most recent liver functions recorded on 02/06/2016 were normal. CBC today shows chronic anemia with  hemoglobin 9.4, MCV 92 a platelet count remains normal at 187,000.  He is in the middle of a gout attack with the main areas of pain in his ankles bilaterally but additional areas of pain in his elbows and fingers. He was put back on colchicine. He was just seen in the emergency department yesterday. He was given a short-term prescription for Vicodin. Symptoms are subsiding on the colchicine but he is still having difficulty and pain ambulating.  He had a recent follow-up visit with his cardiologist on January 5. No medication changes were made.   Medications:  Current Outpatient Prescriptions:  .  ACCU-CHEK AVIVA PLUS test strip, Test once to twice daily, Disp: 200 each, Rfl: 4 .  aspirin 81 MG chewable tablet, Chew 81 mg by mouth daily., Disp: , Rfl:  .  ferrous sulfate 325 (65 FE) MG tablet, Take 325 mg by mouth daily with breakfast., Disp: , Rfl:  .  fish oil-omega-3 fatty acids 1000 MG capsule, Take 1 g by mouth 2 (two) times daily. , Disp: , Rfl:  .  HYDROcodone-acetaminophen (NORCO/VICODIN) 5-325 MG tablet, Take 1 tablet by mouth every 6 (six) hours as needed for moderate pain or severe pain., Disp: 10 tablet, Rfl: 0 .  isosorbide mononitrate (IMDUR) 30 MG 24 hr tablet, TAKE 1 TABLET BY MOUTH  TWICE A DAY, Disp: 180 tablet, Rfl: 0 .  metFORMIN (GLUCOPHAGE-XR) 500 MG 24 hr tablet, TAKE 2 TABLETS BY MOUTH  DAILY, Disp: 180 tablet, Rfl: 1 .  metoprolol tartrate (LOPRESSOR) 25 MG tablet, Take 1 tablet (25 mg total) by mouth 2 (two) times daily., Disp: 180 tablet, Rfl: 3 .  Multiple Vitamins-Minerals (CENTRUM) tablet, Take 1 tablet by  mouth daily., Disp: , Rfl:  .  nitroGLYCERIN (NITROSTAT) 0.4 MG SL tablet, Place 0.4 mg under the tongue every 5 (five) minutes as needed for chest pain. For 3 doses, Disp: , Rfl:  .  omeprazole (PRILOSEC) 40 MG capsule, TAKE 1 CAPSULE(40 MG) BY MOUTH DAILY, Disp: 90 capsule, Rfl: 1 .  rivaroxaban (XARELTO) 20 MG TABS tablet, Take 20 mg by mouth daily with  supper., Disp: , Rfl:  .  simvastatin (ZOCOR) 20 MG tablet, Take 1 tablet (20 mg total) by mouth at bedtime., Disp: 90 tablet, Rfl: 3 .  vitamin B-12 (CYANOCOBALAMIN) 1000 MCG tablet, Take 1 tablet (1,000 mcg total) by mouth daily., Disp: 90 tablet, Rfl: 3  Allergies:  Allergies  Allergen Reactions  . Allopurinol Other (See Comments)    thrombocytopenia    Review of Systems: See interim history Remaining ROS negative:   Physical Exam: Blood pressure (!) 122/44, pulse 62, temperature 97.8 F (36.6 C), temperature source Oral, height 5\' 10"  (1.778 m), weight 208 lb (94.3 kg), SpO2 100 %. Wt Readings from Last 3 Encounters:  02/23/16 208 lb (94.3 kg)  02/06/16 211 lb 12.8 oz (96.1 kg)  01/20/16 212 lb (96.2 kg)     General appearance: Well-nourished Caucasian man HENNT: Pharynx no erythema, exudate, mass, or ulcer. No thyromegaly or thyroid nodules Lymph nodes: No cervical, supraclavicular, or axillary lymphadenopathy Breasts:  Lungs: Clear to auscultation, resonant to percussion throughout Heart: Regular rhythm, no murmur, no gallop, no rub, no click, no edema Abdomen: Soft, nontender, normal bowel sounds, no mass, no organomegaly Extremities: No edema, no calf tenderness Musculoskeletal: no joint deformities. Some fusiform changes with tenderness and some erythema index fingers both hands. Pain on flexion at the ankles. Extreme difficulty getting on the exam bench and  walking from exam bench to wheelchair.  GU:  Vascular: Carotid pulses 2+, no bruits,  Neurologic: Alert, oriented, PERRLA,, cranial nerves grossly normal, motor strength 5 over 5, reflexes 1+ symmetric, upper body coordination normal, gait normal, Skin: No rash or ecchymosis  Lab Results: CBC W/Diff    Component Value Date/Time   WBC 9.1 02/23/2016 1452   WBC 8.4 01/20/2016 0749   RBC 3.03 (L) 02/23/2016 1452   RBC 2.94 (L) 01/20/2016 0749   HGB 9.0 (L) 01/20/2016 0749   HCT 27.8 (L) 02/23/2016 1452   PLT  187 02/23/2016 1452   MCV 92 02/23/2016 1452   MCH 31.0 02/23/2016 1452   MCH 30.6 01/20/2016 0749   MCHC 33.8 02/23/2016 1452   MCHC 33.0 01/20/2016 0749   RDW 19.2 (H) 02/23/2016 1452   LYMPHSABS 1.8 02/23/2016 1452   MONOABS 504 01/20/2016 0749   EOSABS 0.1 02/23/2016 1452   BASOSABS 0.0 02/23/2016 1452     Chemistry      Component Value Date/Time   NA 136 12/23/2015 0715   NA 135 (A) 04/25/2015   K 4.3 12/23/2015 0715   CL 105 12/23/2015 0715   CO2 24 12/23/2015 0715   BUN 23 12/23/2015 0715   BUN 31 (A) 04/25/2015   CREATININE 1.14 12/23/2015 0715   GLU 123 04/25/2015      Component Value Date/Time   CALCIUM 8.6 12/23/2015 0715   ALKPHOS 92 02/06/2016 1041   AST 27 02/06/2016 1041   ALT 27 02/06/2016 1041   BILITOT 0.8 02/06/2016 1041       Radiological Studies: No results found.  Impression:  #1. Provoked right lower extremity DVT I would recommend stopping his Xarelto at the  end of this month after he completes a 3 month interval. He can then resume nonsteroidals to help control pain from his gout attacks. He can resume Plavix for cardioprotection.  #2. Allopurinol allergy with recurrent thrombocytopenia and hepatitis now resolved he should not be given allopurinol again.  #3. Normochromic anemia.* Likely some component from chronic disease-chronic gout but further evaluation indicated. I am going to do a myeloma screen today.  #4. Chronic gout. Okay for colchicine from my point of view.  #5. Coronary artery disease status post coronary stent.   CC: Patient Care Team: Denita Lung, MD as PCP - General (Family Medicine)   *Addendum: mild, polyclonal elevation of IgG immunoglobulin and kappa/lambda free light chain ratio.Not suggestive of myeloma.    Murriel Hopper, MD, [place]  Hematology-Oncology/Internal Medicine  1/23/20184:05 PM

## 2016-02-25 LAB — CBC WITH DIFFERENTIAL/PLATELET
Basophils Absolute: 0 10*3/uL (ref 0.0–0.2)
Basos: 0 %
EOS (ABSOLUTE): 0.1 10*3/uL (ref 0.0–0.4)
Eos: 1 %
Hematocrit: 27.8 % — ABNORMAL LOW (ref 37.5–51.0)
Hemoglobin: 9.4 g/dL — ABNORMAL LOW (ref 13.0–17.7)
Immature Grans (Abs): 0 10*3/uL (ref 0.0–0.1)
Immature Granulocytes: 0 %
Lymphocytes Absolute: 1.8 10*3/uL (ref 0.7–3.1)
Lymphs: 20 %
MCH: 31 pg (ref 26.6–33.0)
MCHC: 33.8 g/dL (ref 31.5–35.7)
MCV: 92 fL (ref 79–97)
Monocytes Absolute: 0.6 10*3/uL (ref 0.1–0.9)
Monocytes: 7 %
Neutrophils Absolute: 6.6 10*3/uL (ref 1.4–7.0)
Neutrophils: 72 %
Platelets: 187 10*3/uL (ref 150–379)
RBC: 3.03 x10E6/uL — ABNORMAL LOW (ref 4.14–5.80)
RDW: 19.2 % — ABNORMAL HIGH (ref 12.3–15.4)
WBC: 9.1 10*3/uL (ref 3.4–10.8)

## 2016-02-25 LAB — IMMUNOFIXATION ELECTROPHORESIS
IgA/Immunoglobulin A, Serum: 355 mg/dL (ref 61–437)
IgG (Immunoglobin G), Serum: 1882 mg/dL — ABNORMAL HIGH (ref 700–1600)
IgM (Immunoglobulin M), Srm: 110 mg/dL (ref 15–143)
Total Protein: 7.2 g/dL (ref 6.0–8.5)

## 2016-02-25 LAB — KAPPA/LAMBDA LIGHT CHAINS
Ig Kappa Free Light Chain: 128.5 mg/L — ABNORMAL HIGH (ref 3.3–19.4)
Ig Lambda Free Light Chain: 67.6 mg/L — ABNORMAL HIGH (ref 5.7–26.3)
Kappa/Lambda FluidC Ratio: 1.9 — ABNORMAL HIGH (ref 0.26–1.65)

## 2016-02-25 LAB — URIC ACID: Uric Acid: 10.4 mg/dL — ABNORMAL HIGH (ref 3.7–8.6)

## 2016-03-23 DIAGNOSIS — M109 Gout, unspecified: Secondary | ICD-10-CM | POA: Diagnosis not present

## 2016-03-23 DIAGNOSIS — N183 Chronic kidney disease, stage 3 (moderate): Secondary | ICD-10-CM | POA: Diagnosis not present

## 2016-03-23 DIAGNOSIS — I82A11 Acute embolism and thrombosis of right axillary vein: Secondary | ICD-10-CM | POA: Diagnosis not present

## 2016-03-23 DIAGNOSIS — K759 Inflammatory liver disease, unspecified: Secondary | ICD-10-CM | POA: Diagnosis not present

## 2016-03-23 DIAGNOSIS — D696 Thrombocytopenia, unspecified: Secondary | ICD-10-CM | POA: Diagnosis not present

## 2016-04-20 ENCOUNTER — Other Ambulatory Visit: Payer: Self-pay | Admitting: Family Medicine

## 2016-04-20 DIAGNOSIS — M109 Gout, unspecified: Secondary | ICD-10-CM | POA: Diagnosis not present

## 2016-04-20 DIAGNOSIS — I82A11 Acute embolism and thrombosis of right axillary vein: Secondary | ICD-10-CM | POA: Diagnosis not present

## 2016-04-20 DIAGNOSIS — N183 Chronic kidney disease, stage 3 (moderate): Secondary | ICD-10-CM | POA: Diagnosis not present

## 2016-04-20 DIAGNOSIS — K759 Inflammatory liver disease, unspecified: Secondary | ICD-10-CM | POA: Diagnosis not present

## 2016-04-20 DIAGNOSIS — D696 Thrombocytopenia, unspecified: Secondary | ICD-10-CM | POA: Diagnosis not present

## 2016-04-21 NOTE — Telephone Encounter (Signed)
Is this okay to refill? 

## 2016-05-04 ENCOUNTER — Other Ambulatory Visit: Payer: Medicare Other

## 2016-05-04 ENCOUNTER — Telehealth: Payer: Self-pay | Admitting: Cardiovascular Disease

## 2016-05-04 ENCOUNTER — Other Ambulatory Visit: Payer: Medicare Other | Admitting: *Deleted

## 2016-05-04 DIAGNOSIS — E784 Other hyperlipidemia: Secondary | ICD-10-CM | POA: Diagnosis not present

## 2016-05-04 DIAGNOSIS — E7849 Other hyperlipidemia: Secondary | ICD-10-CM

## 2016-05-04 LAB — HEPATIC FUNCTION PANEL
ALT: 41 IU/L (ref 0–44)
AST: 31 IU/L (ref 0–40)
Albumin: 3.4 g/dL — ABNORMAL LOW (ref 3.5–4.8)
Alkaline Phosphatase: 75 IU/L (ref 39–117)
Bilirubin Total: 0.6 mg/dL (ref 0.0–1.2)
Bilirubin, Direct: 0.22 mg/dL (ref 0.00–0.40)
Total Protein: 6.8 g/dL (ref 6.0–8.5)

## 2016-05-04 LAB — LIPID PANEL
Chol/HDL Ratio: 2.8 ratio (ref 0.0–5.0)
Cholesterol, Total: 124 mg/dL (ref 100–199)
HDL: 44 mg/dL (ref >39)
LDL Calculated: 64 mg/dL (ref 0–99)
Triglycerides: 78 mg/dL (ref 0–149)
VLDL Cholesterol Cal: 16 mg/dL (ref 5–40)

## 2016-05-04 NOTE — Telephone Encounter (Signed)
Walk in pt Form- patient has questions about meds-placed in doc box.

## 2016-05-04 NOTE — Telephone Encounter (Signed)
I spoke with pt. He was here for lab work earlier today and completed walk in form to let us know rheumatologist stopped colchicine and started him on prednisone 5 mg daily and Uloric 40 mg daily.  Will update his chart.

## 2016-05-04 NOTE — Addendum Note (Signed)
Addended by: Thompson Grayer on: 05/04/2016 04:13 PM   Modules accepted: Orders

## 2016-05-06 ENCOUNTER — Telehealth: Payer: Self-pay

## 2016-05-06 NOTE — Telephone Encounter (Signed)
Pt states his rheumatologist has changed some of his medications. Medication list updated to reflect these changes. Paul [name]

## 2016-05-13 DIAGNOSIS — M109 Gout, unspecified: Secondary | ICD-10-CM | POA: Diagnosis not present

## 2016-05-13 DIAGNOSIS — N183 Chronic kidney disease, stage 3 (moderate): Secondary | ICD-10-CM | POA: Diagnosis not present

## 2016-05-24 ENCOUNTER — Encounter: Payer: Self-pay | Admitting: Oncology

## 2016-05-24 ENCOUNTER — Ambulatory Visit (INDEPENDENT_AMBULATORY_CARE_PROVIDER_SITE_OTHER): Payer: Medicare Other | Admitting: Oncology

## 2016-05-24 VITALS — BP 146/53 | HR 64 | Temp 98.1°F | Ht 70.0 in | Wt 211.8 lb

## 2016-05-24 DIAGNOSIS — Z86718 Personal history of other venous thrombosis and embolism: Secondary | ICD-10-CM | POA: Diagnosis not present

## 2016-05-24 DIAGNOSIS — I2510 Atherosclerotic heart disease of native coronary artery without angina pectoris: Secondary | ICD-10-CM | POA: Diagnosis not present

## 2016-05-24 DIAGNOSIS — D6959 Other secondary thrombocytopenia: Secondary | ICD-10-CM

## 2016-05-24 DIAGNOSIS — Z09 Encounter for follow-up examination after completed treatment for conditions other than malignant neoplasm: Secondary | ICD-10-CM | POA: Diagnosis not present

## 2016-05-24 DIAGNOSIS — D890 Polyclonal hypergammaglobulinemia: Secondary | ICD-10-CM | POA: Diagnosis not present

## 2016-05-24 DIAGNOSIS — M109 Gout, unspecified: Secondary | ICD-10-CM | POA: Diagnosis not present

## 2016-05-24 DIAGNOSIS — I6529 Occlusion and stenosis of unspecified carotid artery: Secondary | ICD-10-CM

## 2016-05-24 DIAGNOSIS — Z955 Presence of coronary angioplasty implant and graft: Secondary | ICD-10-CM | POA: Diagnosis not present

## 2016-05-24 DIAGNOSIS — T50905A Adverse effect of unspecified drugs, medicaments and biological substances, initial encounter: Secondary | ICD-10-CM | POA: Diagnosis not present

## 2016-05-24 DIAGNOSIS — Z862 Personal history of diseases of the blood and blood-forming organs and certain disorders involving the immune mechanism: Secondary | ICD-10-CM | POA: Diagnosis not present

## 2016-05-24 DIAGNOSIS — Z87891 Personal history of nicotine dependence: Secondary | ICD-10-CM

## 2016-05-24 DIAGNOSIS — D638 Anemia in other chronic diseases classified elsewhere: Secondary | ICD-10-CM | POA: Diagnosis not present

## 2016-05-24 DIAGNOSIS — Z888 Allergy status to other drugs, medicaments and biological substances status: Secondary | ICD-10-CM

## 2016-05-24 DIAGNOSIS — I82411 Acute embolism and thrombosis of right femoral vein: Secondary | ICD-10-CM

## 2016-05-24 DIAGNOSIS — Z8619 Personal history of other infectious and parasitic diseases: Secondary | ICD-10-CM

## 2016-05-24 HISTORY — DX: Polyclonal hypergammaglobulinemia: D89.0

## 2016-05-24 NOTE — Progress Notes (Signed)
Hematology and Oncology Follow Up Visit  Paul [name] [id number] 07/07/1940 76 y.o. 05/24/2016 3:06 PM   Principle Diagnosis: Encounter Diagnoses  Name Primary?  Marland Kitchen DVT of deep femoral vein, right ([place]) Yes  . Thrombocytopenia due to drugs   . 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 Please see my November 4 consultation for complete details. I was asked to evaluate him for an acute right lower extremity DVT Which occurred at the time of an acute gout attack. Additional problems identified on my evaluation were thrombocytopenia and acute hepatitis.  He was on allopurinol and colchicine for long-standing gout. He had a prior hospitalization for acute thrombocytopenia and concomitant hepatitis likely related to  allopurinol. Subsequent to discharge, he was rechallenged with the allopurinol and it seemed clear when I evaluated his course that he was having a medication reaction to this drug. He was on other medications that could affect platelets including quinapril. Both the thrombocytopenia and the hepatitis completely resolved when he stopped the allopurinol. I felt that his acute right lower extremity DVT was a provoked event with a combination of inflammatory response to an acute gout attack and an acute hepatitis/medication reaction. CT angiogram did not show any pulmonary emboli. He was started on Xarelto anticoagulation. He was on aspirin, Plavix, and diclofenac prior to the clot in view of coronary artery disease, previous right coronary stent placement and subsequent stent occlusion.. I advised stopping the Plavix and diclofenac and continuing the Low-dose aspirin while on Xarelto.  I advised stopping the Xarelto after completing a 3 month course.   Liver functions recorded on 02/06/2016 were normal. CBC on 02/23/16 showed chronic anemia with hemoglobin 9.4, MCV 92 a platelet count remained  normal at 187,000.  He was in the middle of a severe gout attack when I saw him on January 22 with the main areas of pain in his ankles bilaterally but additional areas of pain in his elbows and fingers. He was put back on colchicine.  I got an expedited referral to rheumatology.  He was subsequently put on Uloric and has had dramatic improvement in his symptoms.  He had a follow-up visit last week (April 2018) and he was told his uric acid was down at 5.6.  Interim History:   He is very satisfied with his rheumatologic evaluation and treatment.  Symptoms continue to improve.  Now on Uloric.  He has had no other interim medical problems. He was  reevaluated by his cardiologist in January.  No new medication changes except for increase in his metoprolol to 25 mg twice daily.Marland Kitchen  He is scheduled to have a follow-up carotid Doppler study in July of this year.  Medications: reviewed  Allergies:  Allergies  Allergen Reactions  . Allopurinol Other (See Comments)    thrombocytopenia    Review of Systems: See interim history Remaining ROS negative:   Physical Exam: Blood pressure (!) 146/53, pulse 64, temperature 98.1 F (36.7 C), temperature source Oral, height 5\' 10"  (1.778 m), weight 211 lb 12.8 oz (96.1 kg), SpO2 99 %. Wt Readings from Last 3 Encounters:  05/24/16 211 lb 12.8 oz (96.1 kg)  02/23/16 208 lb (94.3 kg)  02/06/16 211 lb 12.8 oz (96.1 kg)     General appearance: Well-nourished Caucasian man HENNT: Pharynx no erythema, exudate, mass, or ulcer. No thyromegaly or thyroid nodules Lymph nodes: No cervical, supraclavicular, or axillary lymphadenopathy Breasts:  Lungs:  Clear to auscultation, resonant to percussion throughout Heart: Regular rhythm, no murmur, no gallop, no rub, no click, no edema Abdomen: Soft, nontender, normal bowel sounds, no mass, no organomegaly Extremities: No edema, no calf tenderness Musculoskeletal: no joint deformities GU:  Vascular: Carotid pulses 2+,  no bruits, distal pulses:  Neurologic: Alert, oriented, PERRLA,  , cranial nerves grossly normal, motor strength 5 over 5, reflexes 1+ symmetric, upper body coordination normal, gait normal, Skin: No rash or ecchymosis  Lab Results: CBC W/Diff    Component Value Date/Time   WBC 9.1 02/23/2016 1452   WBC 8.4 01/20/2016 0749   RBC 3.03 (L) 02/23/2016 1452   RBC 2.94 (L) 01/20/2016 0749   HGB 9.0 (L) 01/20/2016 0749   HCT 27.8 (L) 02/23/2016 1452   PLT 187 02/23/2016 1452   MCV 92 02/23/2016 1452   MCH 31.0 02/23/2016 1452   MCH 30.6 01/20/2016 0749   MCHC 33.8 02/23/2016 1452   MCHC 33.0 01/20/2016 0749   RDW 19.2 (H) 02/23/2016 1452   LYMPHSABS 1.8 02/23/2016 1452   MONOABS 504 01/20/2016 0749   EOSABS 0.1 02/23/2016 1452   BASOSABS 0.0 02/23/2016 1452     Chemistry      Component Value Date/Time   NA 136 12/23/2015 0715   NA 135 (A) 04/25/2015   K 4.3 12/23/2015 0715   CL 105 12/23/2015 0715   CO2 24 12/23/2015 0715   BUN 23 12/23/2015 0715   BUN 31 (A) 04/25/2015   CREATININE 1.14 12/23/2015 0715   GLU 123 04/25/2015      Component Value Date/Time   CALCIUM 8.6 12/23/2015 0715   ALKPHOS 75 05/04/2016 0746   AST 31 05/04/2016 0746   ALT 41 05/04/2016 0746   BILITOT 0.6 05/04/2016 0746    Lab today: Pending   Radiological Studies: No results found.  Impression:  #1.  Thrombocytopenia related to acute drug reaction to allopurinol.  Resolved.  #2.  Provoked DVT November 2017 as part of generalized drug reaction and concomitant gout flare anticoagulated for 3 months with Xarelto.  #3.  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  #4.  Gout.  Now responding to Uloric  #5.  Anemia of chronic disease I got immunoglobulin studies  at time of last visit on February 23, 2016.  Total IgG slightly elevated at 1,882 mg percent.  Increased kappa lambda ratio of 1.9 but immunofixation electrophoresis showed a polyclonal pattern consistent with  chronic inflammation, likely related to his gout.  I will consider repeating this in the future now that his gout is under better control.  #6.  Type 2 diabetes  #7.  Coronary artery disease Status post right coronary stent with stent occlusion not amenable to restenting.  Currently on medical therapy alone.  #8.  Carotid artery disease  Assuming labs today are stable compared with his baseline, I told him he could see me again on an as-needed basis.  I did not schedule a formal revisit.   CC: Patient Care Team: Denita Lung, MD as PCP - General (Family Medicine)   Murriel Hopper, MD, [place]  Hematology-Oncology/Internal Medicine     4/23/20183:06 PM

## 2016-05-24 NOTE — Patient Instructions (Signed)
To lab today Return visit as needed

## 2016-05-25 LAB — CBC WITH DIFFERENTIAL/PLATELET
Basophils Absolute: 0 10*3/uL (ref 0.0–0.2)
Basos: 0 %
EOS (ABSOLUTE): 0.1 10*3/uL (ref 0.0–0.4)
Eos: 1 %
Hematocrit: 28.8 % — ABNORMAL LOW (ref 37.5–51.0)
Hemoglobin: 9.7 g/dL — ABNORMAL LOW (ref 13.0–17.7)
Immature Grans (Abs): 0.1 10*3/uL (ref 0.0–0.1)
Immature Granulocytes: 1 %
Lymphocytes Absolute: 2.2 10*3/uL (ref 0.7–3.1)
Lymphs: 23 %
MCH: 31.1 pg (ref 26.6–33.0)
MCHC: 33.7 g/dL (ref 31.5–35.7)
MCV: 92 fL (ref 79–97)
Monocytes Absolute: 0.8 10*3/uL (ref 0.1–0.9)
Monocytes: 8 %
Neutrophils Absolute: 6.4 10*3/uL (ref 1.4–7.0)
Neutrophils: 67 %
Platelets: 199 10*3/uL (ref 150–379)
RBC: 3.12 x10E6/uL — ABNORMAL LOW (ref 4.14–5.80)
RDW: 19 % — ABNORMAL HIGH (ref 12.3–15.4)
WBC: 9.6 10*3/uL (ref 3.4–10.8)

## 2016-05-25 LAB — BASIC METABOLIC PANEL
BUN/Creatinine Ratio: 22 (ref 10–24)
BUN: 24 mg/dL (ref 8–27)
CO2: 23 mmol/L (ref 18–29)
Calcium: 9 mg/dL (ref 8.6–10.2)
Chloride: 100 mmol/L (ref 96–106)
Creatinine, Ser: 1.11 mg/dL (ref 0.76–1.27)
GFR calc Af Amer: 75 mL/min/{1.73_m2} (ref >59)
GFR calc non Af Amer: 65 mL/min/{1.73_m2} (ref >59)
Glucose: 159 mg/dL — ABNORMAL HIGH (ref 65–99)
Potassium: 4.3 mmol/L (ref 3.5–5.2)
Sodium: 139 mmol/L (ref 134–144)

## 2016-05-26 ENCOUNTER — Telehealth: Payer: Self-pay | Admitting: *Deleted

## 2016-05-26 NOTE — Telephone Encounter (Signed)
Pt called / informed "blood platelets remain normal. No change in chronic anemia but blood count stable; will forward results to his primary" per Dr Beryle Beams. Stated "ok".

## 2016-05-26 NOTE — Telephone Encounter (Signed)
-----   Message from Annia Belt, MD sent at 05/26/2016 10:33 AM EDT ----- Call pt: blood platelets remain normal. No change in chronic anemia but blood count stable; will forward results to his primary

## 2016-06-08 ENCOUNTER — Other Ambulatory Visit: Payer: Self-pay | Admitting: Family Medicine

## 2016-06-08 ENCOUNTER — Other Ambulatory Visit: Payer: Self-pay | Admitting: Cardiovascular Disease

## 2016-06-08 NOTE — Telephone Encounter (Signed)
Is this okay to refill? 

## 2016-06-22 DIAGNOSIS — N183 Chronic kidney disease, stage 3 (moderate): Secondary | ICD-10-CM | POA: Diagnosis not present

## 2016-06-22 DIAGNOSIS — D696 Thrombocytopenia, unspecified: Secondary | ICD-10-CM | POA: Diagnosis not present

## 2016-06-22 DIAGNOSIS — K759 Inflammatory liver disease, unspecified: Secondary | ICD-10-CM | POA: Diagnosis not present

## 2016-06-22 DIAGNOSIS — I82A11 Acute embolism and thrombosis of right axillary vein: Secondary | ICD-10-CM | POA: Diagnosis not present

## 2016-06-22 DIAGNOSIS — M109 Gout, unspecified: Secondary | ICD-10-CM | POA: Diagnosis not present

## 2016-07-30 ENCOUNTER — Telehealth: Payer: Self-pay | Admitting: Family Medicine

## 2016-07-30 NOTE — Telephone Encounter (Signed)
Pt came in and stated that he was to have labs drawn. I look in chart and do not see any orders or anything in the notes. Please advise pt at [phone].  ALSO, pt states his medications have changed. I am sending back a medication list with changes noted.

## 2016-08-02 NOTE — Telephone Encounter (Signed)
Have him come in for a med check/diabetes appointment

## 2016-08-03 NOTE — Telephone Encounter (Signed)
Patient has appointment 7/30

## 2016-08-10 ENCOUNTER — Ambulatory Visit (HOSPITAL_COMMUNITY)
Admission: RE | Admit: 2016-08-10 | Discharge: 2016-08-10 | Disposition: A | Discharge status: Home / Self Care | Payer: Medicare Other | Source: Ambulatory Visit | Attending: Cardiovascular Disease | Admitting: Cardiovascular Disease

## 2016-08-10 DIAGNOSIS — Z87891 Personal history of nicotine dependence: Secondary | ICD-10-CM | POA: Insufficient documentation

## 2016-08-10 DIAGNOSIS — I6501 Occlusion and stenosis of right vertebral artery: Secondary | ICD-10-CM | POA: Diagnosis not present

## 2016-08-10 DIAGNOSIS — I2510 Atherosclerotic heart disease of native coronary artery without angina pectoris: Secondary | ICD-10-CM | POA: Diagnosis not present

## 2016-08-10 DIAGNOSIS — I714 Abdominal aortic aneurysm, without rupture: Secondary | ICD-10-CM | POA: Diagnosis not present

## 2016-08-10 DIAGNOSIS — I10 Essential (primary) hypertension: Secondary | ICD-10-CM | POA: Diagnosis not present

## 2016-08-10 DIAGNOSIS — E119 Type 2 diabetes mellitus without complications: Secondary | ICD-10-CM | POA: Diagnosis not present

## 2016-08-10 DIAGNOSIS — I6523 Occlusion and stenosis of bilateral carotid arteries: Secondary | ICD-10-CM | POA: Diagnosis not present

## 2016-08-10 DIAGNOSIS — I779 Disorder of arteries and arterioles, unspecified: Secondary | ICD-10-CM | POA: Diagnosis present

## 2016-08-23 DIAGNOSIS — M25562 Pain in left knee: Secondary | ICD-10-CM | POA: Diagnosis not present

## 2016-08-23 DIAGNOSIS — D696 Thrombocytopenia, unspecified: Secondary | ICD-10-CM | POA: Diagnosis not present

## 2016-08-23 DIAGNOSIS — M109 Gout, unspecified: Secondary | ICD-10-CM | POA: Diagnosis not present

## 2016-08-23 DIAGNOSIS — I82A11 Acute embolism and thrombosis of right axillary vein: Secondary | ICD-10-CM | POA: Diagnosis not present

## 2016-08-23 DIAGNOSIS — N183 Chronic kidney disease, stage 3 (moderate): Secondary | ICD-10-CM | POA: Diagnosis not present

## 2016-08-25 ENCOUNTER — Other Ambulatory Visit: Payer: Self-pay | Admitting: Family Medicine

## 2016-08-26 ENCOUNTER — Ambulatory Visit (INDEPENDENT_AMBULATORY_CARE_PROVIDER_SITE_OTHER): Payer: Medicare Other | Admitting: Cardiovascular Disease

## 2016-08-26 ENCOUNTER — Encounter: Payer: Self-pay | Admitting: Cardiovascular Disease

## 2016-08-26 VITALS — BP 102/50 | HR 65 | Ht 70.0 in | Wt 217.6 lb

## 2016-08-26 DIAGNOSIS — I779 Disorder of arteries and arterioles, unspecified: Secondary | ICD-10-CM | POA: Diagnosis not present

## 2016-08-26 DIAGNOSIS — I10 Essential (primary) hypertension: Secondary | ICD-10-CM

## 2016-08-26 DIAGNOSIS — E785 Hyperlipidemia, unspecified: Secondary | ICD-10-CM | POA: Diagnosis not present

## 2016-08-26 DIAGNOSIS — I2510 Atherosclerotic heart disease of native coronary artery without angina pectoris: Secondary | ICD-10-CM | POA: Diagnosis not present

## 2016-08-26 DIAGNOSIS — I739 Peripheral vascular disease, unspecified: Principal | ICD-10-CM

## 2016-08-26 DIAGNOSIS — I351 Nonrheumatic aortic (valve) insufficiency: Secondary | ICD-10-CM | POA: Diagnosis not present

## 2016-08-26 NOTE — Progress Notes (Signed)
Chief Complaint  Patient presents with  . Follow-up     History of Present Illness: 76 yo WM with history of CAD, DM, bilateral carotid artery disease, HTN, HLD, DVT here today for cardiology follow up. His echo in 2009 showed mild LVH and normal LV function with no significant valvular abnormalities. His cath iin 2009 showed a near total occlusion of the RCA beyond the stent in the mid portion. I attempted PCI and was able to balloon the mid lesion but could never pass a balloon distally. He did well after that.  Cardiac cath in December 2013 in the setting of worsened chest pain/dsyspnea showed total occlusion of the RCA which was not amenable to PCI (PCI attempted in 2009). The LAD and Circumflex had moderate disease. Normal ABI 10/01/13. Carotid artery dopplers July 2018 with stable moderate bilateral ICA stenosis. He was found to have a DVT in November 2017 and was treated with Xarelto. He is now off of Xarelto and back on ASA and Plavix. Echo November 2017 with normal LV systolic function, moderate AI.   He is here today for follow up. The patient denies any chest pain, dyspnea, palpitations, lower extremity edema, orthopnea, PND, dizziness, near syncope or syncope.   Primary Care Physician: Denita Lung, MD   Past Medical History:  Diagnosis Date  . AAA (abdominal aortic aneurysm) ([place])   . Abnormal LFTs   . AKI (acute kidney injury) ([place])   . Anemia of chronic disease   . Arthritis    "all over" (12/04/2015)  . Atherosclerosis of native coronary artery of native heart without angina pectoris   . CAD (coronary artery disease)    with cath 09/26/08 showing severe heart disease in RCA with POBA, mild to mod diesease in circ and LAD  . Chronic back pain    back problems prior to surgery   . Constipation   . Coronary atherosclerosis of native coronary artery   . Dehydration   . Diabetes mellitus    with neuropathy  . Diarrhea   . Elevated liver enzymes   . Generalized weakness    . Gout of right hand   . Hiatal hernia   . History of gastroesophageal reflux (GERD)   . History of stomach ulcers   . Hyperlipidemia   . Hypertension   . Metabolic acidosis   . Normochromic anemia 02/23/2016  . 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 Thrombocytopenia ([place])   . Vitamin B12 deficiency   . Vitamin B12 deficiency   . Vitamin D deficiency   . Weakness     Past Surgical History:  Procedure Laterality Date  . ANKLE FRACTURE SURGERY Left    "crushed"  . BACK SURGERY  X 3   "fell 4 stories in 1977"  . CARPAL TUNNEL RELEASE Left   . CORONARY ANGIOPLASTY WITH STENT PLACEMENT  09/26/2008   [name] 06/03/2010  . FRACTURE SURGERY    . LEFT HEART CATHETERIZATION WITH CORONARY ANGIOGRAM N/A 01/21/2012   Procedure: LEFT HEART CATHETERIZATION WITH CORONARY ANGIOGRAM;  Surgeon: Burnell Blanks, MD;  Location: [facility] CATH LAB;  Service: Cardiovascular;  Laterality: N/A;  . LUMBAR [place]; ~ 1980  . POSTERIOR LUMBAR FUSION  ~ 1998   "3rd OR to reconstruct my back after fall in 1977"  . 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 and 2nd    Current Outpatient Prescriptions  Medication Sig Dispense Refill  . ACCU-CHEK AVIVA PLUS test strip Test once to twice daily 200  each 4  . aspirin 81 MG chewable tablet Chew 81 mg by mouth daily.    . clopidogrel (PLAVIX) 75 MG tablet TAKE 1 TABLET BY MOUTH  DAILY 90 tablet 3  . febuxostat (ULORIC) 40 MG tablet Take 40 mg by mouth daily.    . ferrous sulfate 325 (65 FE) MG tablet Take 325 mg by mouth daily with breakfast.    . fish oil-omega-3 fatty acids 1000 MG capsule Take 1 g by mouth 2 (two) times daily.     . isosorbide mononitrate (IMDUR) 30 MG 24 hr tablet Take 1 tablet (30 mg total) by mouth 2 (two) times daily. Keep 08/26/16 appt for further refills 180 tablet 0  . metFORMIN (GLUCOPHAGE-XR) 500 MG 24 hr tablet TAKE 2 TABLETS BY MOUTH  DAILY 180 tablet 0  . metoprolol tartrate (LOPRESSOR) 25 MG tablet Take 1 tablet (25 mg total) by mouth 2  (two) times daily. 180 tablet 3  . Multiple Vitamins-Minerals (CENTRUM) tablet Take 1 tablet by mouth daily.    . nitroGLYCERIN (NITROSTAT) 0.4 MG SL tablet Place 0.4 mg under the tongue every 5 (five) minutes as needed for chest pain. For 3 doses    . predniSONE (DELTASONE) 5 MG tablet Take 5 mg by mouth daily.    . simvastatin (ZOCOR) 20 MG tablet Take 1 tablet (20 mg total) by mouth at bedtime. 90 tablet 3  . vitamin B-12 (CYANOCOBALAMIN) 1000 MCG tablet Take 1 tablet (1,000 mcg total) by mouth daily. 90 tablet 3   No current facility-administered medications for this visit.     Allergies  Allergen Reactions  . Allopurinol Other (See Comments)    thrombocytopenia    Social History   Social History  . Marital status: Married    Spouse name: N/A  . Number of children: N/A  . Years of education: N/A   Occupational History  . Not on file.   Social History Main Topics  . Smoking status: Former Smoker    Years: 20.00    Types: Cigars    Quit date: 01/24/2013  . Smokeless tobacco: Never Used  . Alcohol use No     Comment: 12/04/2015 "nothing in 3-4 years"  . Drug use: No  . Sexual activity: Not on file   Other Topics Concern  . Not on file   Social History Narrative   Divorced, 1 child.    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    Owned roofing co. In [place]    Family History  Problem Relation Age of Onset  . Heart attack Mother   . Cancer Mother   . Heart attack Sister     Review of Systems:  As stated in the HPI and otherwise negative.   BP (!) 102/50   Pulse 65   Ht 5\' 10"  (1.778 m)   Wt 217 lb 9.6 oz (98.7 kg)   SpO2 96%   BMI 31.22 kg/m   Physical Examination:  General: Well developed, well nourished, NAD  HEENT: OP clear, mucus membranes moist  SKIN: warm, dry. No rashes. Neuro: No focal deficits  Musculoskeletal: Muscle strength 5/5 all ext  Psychiatric: Mood and affect normal  Neck: No JVD, no carotid bruits, no  thyromegaly, no lymphadenopathy.  Lungs:Clear bilaterally, no wheezes, rhonci, crackles Cardiovascular: Regular rate and rhythm. No murmurs, gallops or rubs. Abdomen:Soft. Bowel sounds present. Non-tender.  Extremities: No lower extremity edema. Pulses are 2 + in the bilateral DP/PT.  Echo November 2017:  Left ventricle: The cavity size was mildly dilated. Systolic   function was normal. The estimated ejection fraction was in the   range of 60% to 65%. Wall motion was normal; there were no   regional wall motion abnormalities. There was an increased   relative contribution of atrial contraction to ventricular   filling. Doppler parameters are consistent with abnormal left   ventricular relaxation (grade 1 diastolic dysfunction). - Aortic valve: Trileaflet; normal thickness, mildly calcified   leaflets. There was moderate regurgitation. - Left atrium: The atrium was moderately dilated. - Atrial septum: There was increased thickness of the septum,   consistent with lipomatous hypertrophy. - Pulmonary arteries: PA peak pressure: 36 mm Hg (S). - Impressions: The right ventricular systolic pressure was   increased consistent with mild pulmonary hypertension.  Impressions:  - The right ventricular systolic pressure was increased consistent   with mild pulmonary hypertension.  EKG:  EKG is not ordered today. The ekg ordered today demonstrates   Recent Labs: 12/06/2015: B Natriuretic Peptide 155.6 12/07/2015: Magnesium 1.8 05/04/2016: ALT 41 05/24/2016: BUN 24; Creatinine, Ser 1.11; Hemoglobin 9.7; Platelets 199; Potassium 4.3; Sodium 139   Lipid Panel    Component Value Date/Time   CHOL 124 05/04/2016 0746   TRIG 78 05/04/2016 0746   HDL 44 05/04/2016 0746   CHOLHDL 2.8 05/04/2016 0746   CHOLHDL 3.6 03/03/2015 0001   VLDL 27 03/03/2015 0001   LDLCALC 64 05/04/2016 0746     Wt Readings from Last 3 Encounters:  08/26/16 217 lb 9.6 oz (98.7 kg)  05/24/16 211 lb 12.8 oz (96.1 kg)    02/23/16 208 lb (94.3 kg)     Other studies Reviewed: Additional studies/ records that were reviewed today include: . Review of the above records demonstrates:    Assessment and Plan:   1. CAD with chronic stable angina: He has no worsening of his chronic angina. He is on Imdur. The RCA is known to be occluded and not amenable to PCI. There is moderate disease in the LAD and Circumflex by cath in 2013. Will continue ASA/Plavix, statin, beta blocker and Imdur.    2. HTN: BP is controlled.   3. Carotid artery disease: Moderate bilateral ICA stenosis, stable by dopplers July 2018.   4. Hyperlipidemia: LDL at goal. Continue statin.   5. Aortic valve insufficiency: Moderate by echo November 2017. Repeat echo in 2019.   Current medicines are reviewed at length with the patient today.  The patient does not have concerns regarding medicines.  The following changes have been made:  no change  Labs/ tests ordered today include:   No orders of the defined types were placed in this encounter.   Disposition:   FU with me in 12 months  Signed, Lauree Chandler, MD 08/26/2016 3:13 PM    [facility] [place], [place], [place]  [address] Phone: [phone]; Fax: [phone]

## 2016-08-26 NOTE — Patient Instructions (Signed)

## 2016-08-30 ENCOUNTER — Encounter: Payer: Self-pay | Admitting: Family Medicine

## 2016-08-30 ENCOUNTER — Ambulatory Visit (INDEPENDENT_AMBULATORY_CARE_PROVIDER_SITE_OTHER): Payer: Medicare Other | Admitting: Family Medicine

## 2016-08-30 VITALS — BP 120/70 | HR 81 | Ht 70.0 in | Wt 219.0 lb

## 2016-08-30 DIAGNOSIS — E119 Type 2 diabetes mellitus without complications: Secondary | ICD-10-CM

## 2016-08-30 DIAGNOSIS — D890 Polyclonal hypergammaglobulinemia: Secondary | ICD-10-CM

## 2016-08-30 DIAGNOSIS — D6959 Other secondary thrombocytopenia: Secondary | ICD-10-CM

## 2016-08-30 DIAGNOSIS — M109 Gout, unspecified: Secondary | ICD-10-CM | POA: Diagnosis not present

## 2016-08-30 DIAGNOSIS — T50905A Adverse effect of unspecified drugs, medicaments and biological substances, initial encounter: Secondary | ICD-10-CM | POA: Diagnosis not present

## 2016-08-30 DIAGNOSIS — D638 Anemia in other chronic diseases classified elsewhere: Secondary | ICD-10-CM | POA: Diagnosis not present

## 2016-08-30 LAB — CBC WITH DIFFERENTIAL/PLATELET
Basophils Absolute: 0 cells/uL (ref 0–200)
Basophils Relative: 0 %
Eosinophils Absolute: 109 cells/uL (ref 15–500)
Eosinophils Relative: 1 %
HCT: 30 % — ABNORMAL LOW (ref 38.5–50.0)
Hemoglobin: 10 g/dL — ABNORMAL LOW (ref 13.2–17.1)
Lymphocytes Relative: 18 %
Lymphs Abs: 1962 cells/uL (ref 850–3900)
MCH: 31.2 pg (ref 27.0–33.0)
MCHC: 33.3 g/dL (ref 32.0–36.0)
MCV: 93.5 fL (ref 80.0–100.0)
MPV: 10.1 fL (ref 7.5–12.5)
Monocytes Absolute: 545 cells/uL (ref 200–950)
Monocytes Relative: 5 %
Neutro Abs: 8284 cells/uL — ABNORMAL HIGH (ref 1500–7800)
Neutrophils Relative %: 76 %
Platelets: 233 10*3/uL (ref 140–400)
RBC: 3.21 MIL/uL — ABNORMAL LOW (ref 4.20–5.80)
RDW: 17.7 % — ABNORMAL HIGH (ref 11.0–15.0)
WBC: 10.9 10*3/uL — ABNORMAL HIGH (ref 4.0–10.5)

## 2016-08-30 LAB — POCT GLYCOSYLATED HEMOGLOBIN (HGB A1C): Hemoglobin A1C: 7.4

## 2016-08-30 NOTE — Progress Notes (Signed)
Subjective:    Patient ID: Paul [name], male    DOB: Mar 20, 1940, 76 y.o.   MRN: [id number]  Paul [name] is a 76 y.o. male who presents for follow-up of Type 2 diabetes mellitus.  Patient is checking home blood sugars.   Home blood sugar records: 106 to 135 How often is blood sugars being checked: once a day Current symptoms/probems  None. Daily foot checks: yes    Any foot concerns: None Since being switched to Uloric, his lower leg swelling and discomfort has gotten much better. Apparently the problem revolves around side effects of allopurinol causing difficulty of thrombocytopenia and probably concomitant hepatitis. He was also noted to have a gammopathy. Last eye exam: 08/31/16  Exercise: walking he is now able to walk much better since starting on the Uloric. He also was given a steroid injection into his left knee for treatment of generalized arthritis which is again help with his walking. He is also taking iron supplementation for his anemia. Continues on metformin. Is also taking simvastatin and having no difficulty with that. Continues on Lopressor for his blood pressure. The following portions of the patient's history were reviewed and updated as appropriate: allergies, current medications, past medical history, past social history and problem list.  ROS as in subjective above.     Objective:    Physical Exam Alert and in no distress otherwise not examined.  Blood pressure 120/70, pulse 81, height 5\' 10"  (1.778 m), weight 219 lb (99.3 kg), SpO2 97 %.  Lab Review Diabetic Labs Latest Ref Rng & Units 08/30/2016 05/24/2016 05/04/2016 12/23/2015 12/08/2015  HbA1c - 7.4 - - - -  Microalbumin mg/L - - - - -  Micro/Creat Ratio - - - - - -  Chol 100 - 199 mg/dL - - 124 - -  HDL >39 mg/dL - - 44 - -  Calc LDL 0 - 99 mg/dL - - 64 - -  Triglycerides 0 - 149 mg/dL - - 78 - -  Creatinine 0.76 - 1.27 mg/dL - 1.11 - 1.14 0.93  GFR >60.00 mL/min - - - - -   BP/Weight 08/30/2016  08/26/2016 05/24/2016 02/23/2016 0/24/0973  Systolic BP 532 992 426 834 196  Diastolic BP 70 50 53 44 65  Wt. (Lbs) 219 217.6 211.8 208 -  BMI 31.42 31.22 30.39 29.84 -   Foot/eye exam completion dates Latest Ref Rng & Units 08/28/2015 03/03/2015  Eye Exam No Retinopathy No Retinopathy -  Foot Form Completion - - Done  Paul [name]  reports that he quit smoking about 3 years ago. His smoking use included Cigars. He quit after 20.00 years of use. He has never used smokeless tobacco. He reports that he does not drink alcohol or use drugs.  his A1c is 7.4    Assessment & Plan:    Controlled type 2 diabetes mellitus without complication, without long-term current use of insulin ([place]) - Plan: HgB A1c, CBC with Differential/Platelet, Comprehensive metabolic panel  Thrombocytopenia due to drugs - Plan: CBC with Differential/Platelet  Anemia of chronic disease - Plan: CBC with Differential/Platelet  Gout of foot, unspecified cause, unspecified chronicity, unspecified laterality - Plan: Uric Acid  Polyclonal gammopathy  1. Rx changes: none 2. Education: Reviewed 'ABCs' of diabetes management (respective goals in parentheses):  A1C (<7), blood pressure (<130/80), and cholesterol (LDL <100). 3. Compliance at present is estimated to be good. Efforts to improve compliance (if necessary) will be directed at increased exercise. Follow up: 4 months  he is doing much better now that he is off of his allopurinol. His diabetes seems to be under good control. Continue with present medication regimen for the above diagnoses.

## 2016-08-31 DIAGNOSIS — H4321 Crystalline deposits in vitreous body, right eye: Secondary | ICD-10-CM | POA: Diagnosis not present

## 2016-08-31 DIAGNOSIS — E119 Type 2 diabetes mellitus without complications: Secondary | ICD-10-CM | POA: Diagnosis not present

## 2016-08-31 DIAGNOSIS — H2513 Age-related nuclear cataract, bilateral: Secondary | ICD-10-CM | POA: Diagnosis not present

## 2016-08-31 LAB — COMPREHENSIVE METABOLIC PANEL
ALT: 25 U/L (ref 9–46)
AST: 25 U/L (ref 10–35)
Albumin: 3.4 g/dL — ABNORMAL LOW (ref 3.6–5.1)
Alkaline Phosphatase: 72 U/L (ref 40–115)
BUN: 23 mg/dL (ref 7–25)
CO2: 23 mmol/L (ref 20–31)
Calcium: 8.8 mg/dL (ref 8.6–10.3)
Chloride: 104 mmol/L (ref 98–110)
Creat: 0.96 mg/dL (ref 0.70–1.18)
Glucose, Bld: 247 mg/dL — ABNORMAL HIGH (ref 65–99)
Potassium: 4.6 mmol/L (ref 3.5–5.3)
Sodium: 136 mmol/L (ref 135–146)
Total Bilirubin: 0.7 mg/dL (ref 0.2–1.2)
Total Protein: 6.7 g/dL (ref 6.1–8.1)

## 2016-08-31 LAB — URIC ACID: Uric Acid, Serum: 5.2 mg/dL (ref 4.0–8.0)

## 2016-08-31 LAB — HM DIABETES EYE EXAM

## 2016-09-07 ENCOUNTER — Encounter: Payer: Self-pay | Admitting: Family Medicine

## 2016-10-24 ENCOUNTER — Other Ambulatory Visit: Payer: Self-pay | Admitting: Cardiovascular Disease

## 2016-10-27 ENCOUNTER — Other Ambulatory Visit: Payer: Self-pay | Admitting: Family Medicine

## 2016-12-03 DIAGNOSIS — D696 Thrombocytopenia, unspecified: Secondary | ICD-10-CM | POA: Diagnosis not present

## 2016-12-03 DIAGNOSIS — M25562 Pain in left knee: Secondary | ICD-10-CM | POA: Diagnosis not present

## 2016-12-03 DIAGNOSIS — M109 Gout, unspecified: Secondary | ICD-10-CM | POA: Diagnosis not present

## 2016-12-03 DIAGNOSIS — N183 Chronic kidney disease, stage 3 (moderate): Secondary | ICD-10-CM | POA: Diagnosis not present

## 2016-12-03 DIAGNOSIS — I82A11 Acute embolism and thrombosis of right axillary vein: Secondary | ICD-10-CM | POA: Diagnosis not present

## 2016-12-03 DIAGNOSIS — Z23 Encounter for immunization: Secondary | ICD-10-CM | POA: Diagnosis not present

## 2016-12-09 ENCOUNTER — Encounter: Payer: Self-pay | Admitting: Family Medicine

## 2016-12-20 DIAGNOSIS — N183 Chronic kidney disease, stage 3 (moderate): Secondary | ICD-10-CM | POA: Diagnosis not present

## 2017-01-09 ENCOUNTER — Other Ambulatory Visit: Payer: Self-pay | Admitting: Family Medicine

## 2017-01-11 ENCOUNTER — Telehealth: Payer: Self-pay

## 2017-01-11 NOTE — Telephone Encounter (Signed)
Last seen 01/19/16, okay to refill? Thanks!

## 2017-01-11 NOTE — Telephone Encounter (Signed)
Needs an appt

## 2017-01-11 NOTE — Telephone Encounter (Signed)
Called patient and scheduled for an appointment.

## 2017-01-17 ENCOUNTER — Encounter: Payer: Self-pay | Admitting: Family Medicine

## 2017-01-17 ENCOUNTER — Ambulatory Visit (INDEPENDENT_AMBULATORY_CARE_PROVIDER_SITE_OTHER): Payer: Medicare Other | Admitting: Family Medicine

## 2017-01-17 VITALS — BP 138/62 | HR 78 | Ht 70.0 in | Wt 211.4 lb

## 2017-01-17 DIAGNOSIS — M109 Gout, unspecified: Secondary | ICD-10-CM

## 2017-01-17 MED ORDER — TRIAMCINOLONE ACETONIDE 40 MG/ML IJ SUSP
40.0000 mg | Freq: Once | INTRAMUSCULAR | Status: AC
  Administered 2017-01-17: 40 mg via INTRA_ARTICULAR

## 2017-01-17 MED ORDER — LIDOCAINE HCL 2 % IJ SOLN
3.0000 mL | Freq: Once | INTRAMUSCULAR | Status: AC
  Administered 2017-01-17: 60 mg

## 2017-01-17 MED ORDER — TRAMADOL HCL 50 MG PO TABS: 50.0000 mg | ORAL_TABLET | Freq: Three times a day (TID) | ORAL | 0 refills | Status: DC | PRN

## 2017-01-17 NOTE — Progress Notes (Signed)
   Subjective:    Patient ID: Paul [name], male    DOB: [birth year], 76 y.o.   MRN: [id number]  HPI He has a long history of difficulty with gout.  He is recently seen by rheumatology.  Apparently several months ago he did have a knee effusion which was drained and given steroid.  Approximately in mid December he was also having difficulty and a Sterapred Dosepak was called in which did help his left knee quite down but about 3 days later he had difficulty with increased pain and swelling.  He decided to come here for further care.  Continues on medications listed in the chart   Review of Systems     Objective:   Physical Exam Exam of the left knee does show an effusion it is warm and tender to palpation.  Also pain on motion of the knee      Assessment & Plan:  Gouty arthritis - Plan: triamcinolone acetonide (KENALOG-40) injection 40 mg, lidocaine (XYLOCAINE) 2 % (with pres) injection 60 mg, Synovial fluid, cell count, Synovial fluid, crystal, Body fluid culture He is interested in getting an injection into the to help this.  I explained the benefits and risks.  The knee was prepped laterally.  30 cc of yellow slightly cloudy material was removed without difficulty.  40 mg of Kenalog and 3 cc of Xylocaine was injected into the joint.  Encouraged him to set up an appointment with rheumatology concerning Follow-up since her present medication regimen is obviously not holding him.  We will also call in tramadol.

## 2017-01-18 LAB — SYNOVIAL FLUID, CRYSTAL

## 2017-01-21 ENCOUNTER — Encounter: Payer: Self-pay | Admitting: Family Medicine

## 2017-01-21 ENCOUNTER — Ambulatory Visit (INDEPENDENT_AMBULATORY_CARE_PROVIDER_SITE_OTHER): Payer: Medicare Other | Admitting: Family Medicine

## 2017-01-21 VITALS — BP 140/60 | HR 63 | Resp 16 | Wt 212.6 lb

## 2017-01-21 DIAGNOSIS — T50905A Adverse effect of unspecified drugs, medicaments and biological substances, initial encounter: Secondary | ICD-10-CM

## 2017-01-21 DIAGNOSIS — D638 Anemia in other chronic diseases classified elsewhere: Secondary | ICD-10-CM | POA: Diagnosis not present

## 2017-01-21 DIAGNOSIS — E1159 Type 2 diabetes mellitus with other circulatory complications: Secondary | ICD-10-CM

## 2017-01-21 DIAGNOSIS — E785 Hyperlipidemia, unspecified: Secondary | ICD-10-CM | POA: Diagnosis not present

## 2017-01-21 DIAGNOSIS — E1140 Type 2 diabetes mellitus with diabetic neuropathy, unspecified: Secondary | ICD-10-CM | POA: Diagnosis not present

## 2017-01-21 DIAGNOSIS — D6959 Other secondary thrombocytopenia: Secondary | ICD-10-CM

## 2017-01-21 DIAGNOSIS — E1169 Type 2 diabetes mellitus with other specified complication: Secondary | ICD-10-CM | POA: Diagnosis not present

## 2017-01-21 DIAGNOSIS — E538 Deficiency of other specified B group vitamins: Secondary | ICD-10-CM

## 2017-01-21 DIAGNOSIS — E1121 Type 2 diabetes mellitus with diabetic nephropathy: Secondary | ICD-10-CM | POA: Diagnosis not present

## 2017-01-21 DIAGNOSIS — I10 Essential (primary) hypertension: Secondary | ICD-10-CM | POA: Diagnosis not present

## 2017-01-21 DIAGNOSIS — I152 Hypertension secondary to endocrine disorders: Secondary | ICD-10-CM

## 2017-01-21 LAB — POCT GLYCOSYLATED HEMOGLOBIN (HGB A1C): Hemoglobin A1C: 7.2

## 2017-01-21 LAB — POCT UA - MICROALBUMIN
Albumin/Creatinine Ratio, Urine, POC: 39.6
Creatinine, POC: 91.7 mg/dL
Microalbumin Ur, POC: 36.3 mg/L

## 2017-01-21 NOTE — Progress Notes (Signed)
Subjective:    Patient ID: Paul [name], male    DOB: 10/23/1940, 76 y.o.   MRN: [id number]  Paul [name] is a 76 y.o. male who presents for follow-up of Type 2 diabetes mellitus.  Home blood sugar records: He has not been checking his sugars Current symptoms/problems include none and have been unchanged. Daily foot checks:   Any foot concerns: no Exercise: The patient does not participate in regular exercise at present. 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  The left knee effusion was taken care of and steroid injection placed.  He is doing much better with that.  He continues to use tramadol on an as-needed basis.  He is using simvastatin without difficulty.  Does not take nitroglycerin but does continue on his other blood pressure/cardiac medications.  Continues on metformin.  He also continues on Plavix. the following portions of the patient's history were reviewed and updated as appropriate: allergies, current medications, past medical history, past social history and problem list.  ROS as in subjective above.     Objective:    Physical Exam Alert and in no distress.  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  Also decreased sensation is noted.. Micro albumin  Blood pressure 140/60, pulse 63, resp. rate 16, weight 212 lb 9.6 oz (96.4 kg), SpO2 98 %.  Lab Review Diabetic Labs Latest Ref Rng & Units 01/21/2017 08/30/2016 05/24/2016 05/04/2016 12/23/2015  HbA1c - 7.2 7.4 - - -  Microalbumin mg/L 36.3 - - - -  Micro/Creat Ratio - 39.6 - - - -  Chol 100 - 199 mg/dL - - - 124 -  HDL >39 mg/dL - - - 44 -  Calc LDL 0 - 99 mg/dL - - - 64 -  Triglycerides 0 - 149 mg/dL - - - 78 -  Creatinine 0.70 - 1.18 mg/dL - 0.96 1.11 - 1.14  GFR >60.00 mL/min - - - - -   BP/Weight 01/21/2017 01/17/2017 08/30/2016 08/26/2016 5/64/3329  Systolic BP 518 841 660 630 160  Diastolic BP 60 62 70 50 53  Wt. (Lbs) 212.6 211.4 219 217.6 211.8    BMI 30.5 30.33 31.42 31.22 30.39   Foot/eye exam completion dates Latest Ref Rng & Units 01/21/2017 08/31/2016  Eye Exam No Retinopathy - No Retinopathy  Foot Form Completion - Done -  Microalbumin is positive A1c is 7.2 [place]  reports that he quit smoking about 3 years ago. His smoking use included cigars. He quit after 20.00 years of use. he has never used smokeless tobacco. He reports that he does not drink alcohol or use drugs.     Assessment & Plan:    Controlled type 2 diabetes mellitus with diabetic nephropathy, without long-term current use of insulin ([place]) - Plan: HgB A1c, POCT UA - Microalbumin  Thrombocytopenia due to drugs  Anemia of chronic disease  Vitamin B12 deficiency  Hyperlipidemia associated with type 2 diabetes mellitus ([name])  Type 2 diabetes mellitus with diabetic neuropathy, without long-term current use of insulin ([name])  Diabetic nephropathy associated with type 2 diabetes mellitus ([place])  Hypertension complicating diabetes ([place])   1. Rx changes: none 2. Education: Reviewed 'ABCs' of diabetes management (respective goals in parentheses):  A1C (<7), blood pressure (<130/80), and cholesterol (LDL <100). 3. Compliance at present is estimated to be good. Efforts to improve compliance (if necessary) will be directed at increased exercise. 4. Follow up: 4 months

## 2017-02-07 ENCOUNTER — Other Ambulatory Visit: Payer: Self-pay | Admitting: Family Medicine

## 2017-02-07 ENCOUNTER — Other Ambulatory Visit: Payer: Self-pay | Admitting: Cardiovascular Disease

## 2017-03-28 ENCOUNTER — Other Ambulatory Visit: Payer: Self-pay | Admitting: Family Medicine

## 2017-03-28 ENCOUNTER — Other Ambulatory Visit: Payer: Self-pay | Admitting: Cardiovascular Disease

## 2017-03-28 DIAGNOSIS — E7849 Other hyperlipidemia: Secondary | ICD-10-CM

## 2017-04-11 ENCOUNTER — Ambulatory Visit (INDEPENDENT_AMBULATORY_CARE_PROVIDER_SITE_OTHER): Payer: Medicare Other | Admitting: Family Medicine

## 2017-04-11 ENCOUNTER — Encounter: Payer: Self-pay | Admitting: Family Medicine

## 2017-04-11 VITALS — BP 120/54 | HR 98 | Temp 98.0°F | Wt 197.4 lb

## 2017-04-11 DIAGNOSIS — R634 Abnormal weight loss: Secondary | ICD-10-CM

## 2017-04-11 DIAGNOSIS — E1121 Type 2 diabetes mellitus with diabetic nephropathy: Secondary | ICD-10-CM

## 2017-04-11 DIAGNOSIS — R34 Anuria and oliguria: Secondary | ICD-10-CM

## 2017-04-11 DIAGNOSIS — R531 Weakness: Secondary | ICD-10-CM | POA: Diagnosis not present

## 2017-04-11 DIAGNOSIS — D649 Anemia, unspecified: Secondary | ICD-10-CM | POA: Diagnosis not present

## 2017-04-11 DIAGNOSIS — R10816 Epigastric abdominal tenderness: Secondary | ICD-10-CM

## 2017-04-11 LAB — POCT HEMOGLOBIN: Hemoglobin: 9.8 g/dL — AB (ref 14.1–18.1)

## 2017-04-11 NOTE — Progress Notes (Signed)
Chief Complaint  Patient presents with  . Possible UTI    only has urinated alittle bit since friday, he is drinking 2-3 big bottles of water a day. no pain, just can't urinate much   Patient presents today stating "I just don't feel good".  He reports he hasn't voided normally since Friday. He goes 3 times/day, but it is just small amounts.  No odor, not bloody, slightly dark in color. Denies blood in urine. Denies dysuria or pain with urination. No abdominal or kidney pain.  Denies vomiting, diarrhea.  He has been drinking 2-3 large bottles of water/d, 3-4 cups of coffee/d. He has reported some dry mouth. No pain, no dizziness.  PMH, PSH, SH reviewed He has h/o DVT, anemia of chronic disease, DM, CAD, h/o UTI, gout. Lab Results  Component Value Date   HGBA1C 7.2 01/21/2017    Outpatient Encounter Medications as of 04/11/2017  Medication Sig Note  . ACCU-CHEK AVIVA PLUS test strip Test once to twice daily   . aspirin 81 MG chewable tablet Chew 81 mg by mouth daily.   . clopidogrel (PLAVIX) 75 MG tablet TAKE 1 TABLET BY MOUTH  DAILY   . colchicine 0.6 MG tablet TAKE 1 TABLET BY MOUTH  DAILY   . febuxostat (ULORIC) 40 MG tablet Take 40 mg by mouth daily.   . ferrous sulfate 325 (65 FE) MG tablet Take 325 mg by mouth daily with breakfast.   . fish oil-omega-3 fatty acids 1000 MG capsule Take 1 g by mouth 2 (two) times daily.    . isosorbide mononitrate (IMDUR) 30 MG 24 hr tablet TAKE 1 TABLET BY MOUTH  TWICE A DAY   . metFORMIN (GLUCOPHAGE-XR) 500 MG 24 hr tablet TAKE 2 TABLETS BY MOUTH  DAILY   . metoprolol tartrate (LOPRESSOR) 25 MG tablet TAKE 1 TABLET BY MOUTH TWO  TIMES DAILY   . Multiple Vitamins-Minerals (CENTRUM) tablet Take 1 tablet by mouth daily.   . nitroGLYCERIN (NITROSTAT) 0.4 MG SL tablet Place 0.4 mg under the tongue every 5 (five) minutes as needed for chest pain. For 3 doses   . simvastatin (ZOCOR) 20 MG tablet TAKE 1 TABLET BY MOUTH AT  BEDTIME   . traMADol (ULTRAM) 50  MG tablet Take 1 tablet (50 mg total) by mouth every 8 (eight) hours as needed. 04/11/2017: Uses prn, less than once a week.  . vitamin B-12 (CYANOCOBALAMIN) 1000 MCG tablet Take 1 tablet (1,000 mcg total) by mouth daily.    No facility-administered encounter medications on file as of 04/11/2017.    Allergies  Allergen Reactions  . Allopurinol Other (See Comments)    thrombocytopenia    ROS:  No fever, chills, URI symptoms, back pain, bleeding, bruising, rashes. Denies abdominal pain, dysuria, just decreased urine production and weakness. No black or bloody stools. Appetite is good, no nausea, vomiting. 15# weight loss in the last 3 months  PHYSICAL EXAM:  BP (!) 120/54   Pulse 98   Temp 98 F (36.7 C)   Wt 197 lb 6.4 oz (89.5 kg)   SpO2 96%   BMI 28.32 kg/m    Wt Readings from Last 3 Encounters:  04/11/17 197 lb 6.4 oz (89.5 kg)  01/21/17 212 lb 9.6 oz (96.4 kg)  01/17/17 211 lb 6.4 oz (95.9 kg)   Elderly male, who appears pale, with pale lips, slightly dry.  His oral mucosa was not completely dry, but slightly. Conjunctiva and sclera were clear, anicteric. Neck: no lymphadenopathy, thyromegaly  or mass Heart: regular rate and rhythm, no murmur Lungs: clear bilaterally--just slightly diminished BS at left base compared to right. Abdomen: mildly tender in epigastrium, no mass, no rebound or guarding. No organomegaly or tenderness in RUQ.  (he reports the epigastric tenderness is not new). No bladder distension or mass noted. Rectal: normal sphincter tone. Prostate is normal in size, nontender, nonboggy, no nodule. Heme negative stool Extremities: no edema Skin: no rash Psych: normal mood, affect, hygiene and grooming Neuro: alert and oriented. Normal gait, grossly normal strength and cranial nerves.  Hg 9.8  He drank 2 large cups of water but was unable to given urine specimen during visit.   ASSESSMENT/PLAN:  Decreased urine output - suspect dehydration, though  etiology unclear. r/o AKI. no e/o BPH, bladder distension - Plan: Comprehensive metabolic panel  Anemia, unspecified type - h/o anemia of chronic dz, Hg fairly stable per FS POC test - Plan: Hemoglobin, CBC with Differential/Platelet, Ferritin, Iron  Controlled type 2 diabetes mellitus with diabetic nephropathy, without long-term current use of insulin ([place]) - Plan: TSH  Weight loss - noted since last visit 3 mos ago (some may be related to dehydration) - Plan: TSH  Weakness  Epigastric abdominal tenderness without rebound tenderness - ? how chronic (not new per pt); heme negative stool.  h/o small AAA, consider recheck sooner than the 47yr f/u as suggested 12/2015 on [place]   Elderly male, who reports not feeling well, fatigued/weak, appears pale and dehydrated. Decreased urinary output can be related to dehydration, r/o AKI. He has chronic anemia, and Hg is about the same as last check (10.0), heme negative stool.  CT angio from 12/2015 was reviewed, which noted nodularity of liver contour suggesting cirrhosis. Thoracic aorta was atherosclerotic, but no aneurysm. [place] of abdomen 12/2015 showed 3.4cm infrarenal AAA. F/u was rec for 3 years. Consider f/u US.  He was advised to push fluids to hydrate, and we would f/u with his lab results tomorrow.

## 2017-04-11 NOTE — Patient Instructions (Addendum)
We will be in touch with your test results tomorrow. Please drink plenty of fluids tonight. Your poor urinary output may be related to dehydration (small amounts, dark color). We are checking your kidney function in your blood tests.

## 2017-04-12 LAB — COMPREHENSIVE METABOLIC PANEL
ALT: 63 IU/L — ABNORMAL HIGH (ref 0–44)
AST: 70 IU/L — ABNORMAL HIGH (ref 0–40)
Albumin/Globulin Ratio: 0.8 — ABNORMAL LOW (ref 1.2–2.2)
Albumin: 3.2 g/dL — ABNORMAL LOW (ref 3.5–4.8)
Alkaline Phosphatase: 118 IU/L — ABNORMAL HIGH (ref 39–117)
BUN/Creatinine Ratio: 19 (ref 10–24)
BUN: 19 mg/dL (ref 8–27)
Bilirubin Total: 0.7 mg/dL (ref 0.0–1.2)
CO2: 17 mmol/L — ABNORMAL LOW (ref 20–29)
Calcium: 8.9 mg/dL (ref 8.6–10.2)
Chloride: 97 mmol/L (ref 96–106)
Creatinine, Ser: 1.02 mg/dL (ref 0.76–1.27)
GFR calc Af Amer: 83 mL/min/{1.73_m2} (ref >59)
GFR calc non Af Amer: 72 mL/min/{1.73_m2} (ref >59)
Globulin, Total: 3.9 g/dL (ref 1.5–4.5)
Glucose: 136 mg/dL — ABNORMAL HIGH (ref 65–99)
Potassium: 4.8 mmol/L (ref 3.5–5.2)
Sodium: 134 mmol/L (ref 134–144)
Total Protein: 7.1 g/dL (ref 6.0–8.5)

## 2017-04-12 LAB — CBC WITH DIFFERENTIAL/PLATELET
Basophils Absolute: 0 10*3/uL (ref 0.0–0.2)
Basos: 0 %
EOS (ABSOLUTE): 0.1 10*3/uL (ref 0.0–0.4)
Eos: 1 %
Hematocrit: 29.5 % — ABNORMAL LOW (ref 37.5–51.0)
Hemoglobin: 9.9 g/dL — ABNORMAL LOW (ref 13.0–17.7)
Immature Grans (Abs): 0 10*3/uL (ref 0.0–0.1)
Immature Granulocytes: 0 %
Lymphocytes Absolute: 2.7 10*3/uL (ref 0.7–3.1)
Lymphs: 24 %
MCH: 31.8 pg (ref 26.6–33.0)
MCHC: 33.6 g/dL (ref 31.5–35.7)
MCV: 95 fL (ref 79–97)
Monocytes Absolute: 0.6 10*3/uL (ref 0.1–0.9)
Monocytes: 6 %
Neutrophils Absolute: 7.8 10*3/uL — ABNORMAL HIGH (ref 1.4–7.0)
Neutrophils: 69 %
Platelets: 370 10*3/uL (ref 150–379)
RBC: 3.11 x10E6/uL — ABNORMAL LOW (ref 4.14–5.80)
RDW: 20.2 % — ABNORMAL HIGH (ref 12.3–15.4)
WBC: 11.3 10*3/uL — ABNORMAL HIGH (ref 3.4–10.8)

## 2017-04-12 LAB — FERRITIN: Ferritin: 492 ng/mL — ABNORMAL HIGH (ref 30–400)

## 2017-04-12 LAB — TSH: TSH: 1.68 u[IU]/mL (ref 0.450–4.500)

## 2017-04-12 LAB — IRON: Iron: 46 ug/dL (ref 38–169)

## 2017-04-13 ENCOUNTER — Telehealth: Payer: Self-pay | Admitting: Family Medicine

## 2017-04-13 NOTE — Telephone Encounter (Signed)
Please see below sent it to wrong nurse

## 2017-04-13 NOTE — Telephone Encounter (Signed)
Called patient back and he stated that he was waiting on a call with his u/s appt. I went back and looked at the conversation between he and Tarsha yesterday and he clearly told her he was feeling better and was not having any pain, this is why she did not order. I asked him if he was having a pain today or feeling worse-he stated no, just weak and wants to have the u/s to try to figure out what is going on with his liver. Is this okay to order and if so, what do you want me to order? Abdominal u/s?

## 2017-04-13 NOTE — Telephone Encounter (Signed)
New Message  Pt verbalized he was in the office on 3.11.19 and the morning of 3.12.19, he received a phone call stating he may need an MRI and would like to speak to the person who called him.  Please f/u with patient there are no notes stating where someone called him.  Please f/u

## 2017-04-13 NOTE — Telephone Encounter (Signed)
Patient advised.

## 2017-04-13 NOTE — Telephone Encounter (Signed)
Let him know I'm sending this to Dr. Redmond School for his opinion, since it is his patient.  JCL--he would "like to know what is going on with his liver", and would like the US done.  If you're okay/in agreement with [place], we can set it up, let V know.

## 2017-04-14 ENCOUNTER — Other Ambulatory Visit: Payer: Self-pay | Admitting: *Deleted

## 2017-04-14 DIAGNOSIS — R7989 Other specified abnormal findings of blood chemistry: Secondary | ICD-10-CM

## 2017-04-14 DIAGNOSIS — R109 Unspecified abdominal pain: Secondary | ICD-10-CM

## 2017-04-14 DIAGNOSIS — R945 Abnormal results of liver function studies: Secondary | ICD-10-CM

## 2017-04-14 NOTE — Telephone Encounter (Signed)
So is this an abdominal u/s?

## 2017-04-14 NOTE — Telephone Encounter (Signed)
Set this up 

## 2017-04-14 NOTE — Telephone Encounter (Signed)
yes

## 2017-04-14 NOTE — Telephone Encounter (Signed)
Patient scheduled for Mon 04/18/17 at [facility] and was advised.

## 2017-04-18 ENCOUNTER — Ambulatory Visit (HOSPITAL_COMMUNITY)
Admission: RE | Admit: 2017-04-18 | Discharge: 2017-04-18 | Disposition: A | Discharge status: Home / Self Care | Payer: Medicare Other | Source: Ambulatory Visit | Attending: Family Medicine | Admitting: Family Medicine

## 2017-04-18 DIAGNOSIS — R7989 Other specified abnormal findings of blood chemistry: Secondary | ICD-10-CM

## 2017-04-18 DIAGNOSIS — R945 Abnormal results of liver function studies: Secondary | ICD-10-CM | POA: Diagnosis not present

## 2017-04-18 DIAGNOSIS — I714 Abdominal aortic aneurysm, without rupture: Secondary | ICD-10-CM | POA: Diagnosis not present

## 2017-04-18 DIAGNOSIS — R109 Unspecified abdominal pain: Secondary | ICD-10-CM | POA: Insufficient documentation

## 2017-04-19 ENCOUNTER — Telehealth: Payer: Self-pay | Admitting: Family Medicine

## 2017-04-19 NOTE — Telephone Encounter (Signed)
Paul [name] with [facility] called to give a call report.  She advised she left two messages yesterday. Lower Abdominal Aortic Aneurism increased in size currently 4.6 cm.  Recommend follow up by abdominal and pelvis CTA in 6 months.  Also a referral to a vascular surgeon with a consultation.

## 2017-04-19 NOTE — Telephone Encounter (Signed)
Let her know that we are taking care of it

## 2017-05-17 ENCOUNTER — Encounter: Payer: Self-pay | Admitting: Family Medicine

## 2017-05-17 ENCOUNTER — Ambulatory Visit (INDEPENDENT_AMBULATORY_CARE_PROVIDER_SITE_OTHER): Payer: Medicare Other | Admitting: Family Medicine

## 2017-05-17 VITALS — BP 136/68 | HR 74 | Temp 98.0°F | Ht 69.0 in | Wt 200.8 lb

## 2017-05-17 DIAGNOSIS — I152 Hypertension secondary to endocrine disorders: Secondary | ICD-10-CM

## 2017-05-17 DIAGNOSIS — I2510 Atherosclerotic heart disease of native coronary artery without angina pectoris: Secondary | ICD-10-CM

## 2017-05-17 DIAGNOSIS — E1121 Type 2 diabetes mellitus with diabetic nephropathy: Secondary | ICD-10-CM | POA: Diagnosis not present

## 2017-05-17 DIAGNOSIS — M1A0490 Idiopathic chronic gout, unspecified hand, without tophus (tophi): Secondary | ICD-10-CM | POA: Diagnosis not present

## 2017-05-17 DIAGNOSIS — I714 Abdominal aortic aneurysm, without rupture, unspecified: Secondary | ICD-10-CM

## 2017-05-17 DIAGNOSIS — E1159 Type 2 diabetes mellitus with other circulatory complications: Secondary | ICD-10-CM

## 2017-05-17 DIAGNOSIS — E785 Hyperlipidemia, unspecified: Secondary | ICD-10-CM

## 2017-05-17 DIAGNOSIS — I10 Essential (primary) hypertension: Secondary | ICD-10-CM | POA: Diagnosis not present

## 2017-05-17 DIAGNOSIS — E1169 Type 2 diabetes mellitus with other specified complication: Secondary | ICD-10-CM | POA: Diagnosis not present

## 2017-05-17 LAB — POCT GLYCOSYLATED HEMOGLOBIN (HGB A1C): Hemoglobin A1C: 6.2

## 2017-05-17 MED ORDER — FEBUXOSTAT 40 MG PO TABS: 40.0000 mg | ORAL_TABLET | Freq: Every day | ORAL | 3 refills | Status: DC

## 2017-05-17 MED ORDER — LOSARTAN POTASSIUM 25 MG PO TABS
25.0000 mg | ORAL_TABLET | Freq: Every day | ORAL | 3 refills | Status: DC
Start: 2017-05-17 — End: 2017-11-11

## 2017-05-17 MED ORDER — METFORMIN HCL ER 500 MG PO TB24: 1000.0000 mg | ORAL_TABLET | Freq: Every day | ORAL | 1 refills | Status: DC

## 2017-05-17 MED ORDER — CLOPIDOGREL BISULFATE 75 MG PO TABS: 75.0000 mg | ORAL_TABLET | Freq: Every day | ORAL | 3 refills | Status: DC

## 2017-05-17 NOTE — Addendum Note (Signed)
Addended by: Elyse Jarvis on: 05/17/2017 01:54 PM   Modules accepted: Orders

## 2017-05-17 NOTE — Patient Instructions (Addendum)
Take colchicine twice a day for the next week and then call me.  Tylenol can  be used for pain

## 2017-05-17 NOTE — Progress Notes (Signed)
Subjective:    Patient ID: Paul [name], male    DOB: 1940/09/28, 77 y.o.   MRN: [id number]  Paul [name] is a 77 y.o. male who presents for follow-up of Type 2 diabetes mellitus.  Patient is checking home blood sugars.   Home blood sugar records: meter records How often is blood sugars being checked: 3-4 x a week Current symptoms/problems include none and have been unchanged. Daily foot checks:yes   Any foot concerns: no Last eye exam: July 2018 Exercise: walk, push up  Presently he is having difficulty with hand pain and swelling.  He does have a history of gout and presently is on [place].  He did have difficulty with allopurinol and was diagnosed with an allergic reaction to this.  He is also taking 1 colchicine daily and has been on this for several years.  He does follow up with rheumatology concerning this.  He also has an appointment to see his cardiologist.  He does state that he does get dyspnea on exertion walking to his mailbox and back.  He also states that he can walk up a flight of steps but has to stop several times.  He states that this is been constant for the last several years.  He is also scheduled for follow-up concerning his AAA.  Recent CT scanning did show an enlargement of the aneurysm.  He continues on his Metformin as well as simvastatin.  Also is taking isosorbide as well as Plavix and having no difficulty with that.  Continues on iron supplementation and also takes omega-3.  He is not had use nitroglycerin. The following portions of the patient's history were reviewed and updated as appropriate: allergies, current medications, past medical history, past social history and problem list.  ROS as in subjective above.     Objective:    Physical Exam Alert and in no distress .    Exam of his hands does show some swelling of the PIP and DIP joints but it is not hot red and only minimally tender.   Lab Review Diabetic Labs Latest Ref Rng & Units 04/11/2017  01/21/2017 08/30/2016 05/24/2016 05/04/2016  HbA1c - - 7.2 7.4 - -  Microalbumin mg/L - 36.3 - - -  Micro/Creat Ratio - - 39.6 - - -  Chol 100 - 199 mg/dL - - - - 124  HDL >39 mg/dL - - - - 44  Calc LDL 0 - 99 mg/dL - - - - 64  Triglycerides 0 - 149 mg/dL - - - - 78  Creatinine 0.76 - 1.27 mg/dL 1.02 - 0.96 1.11 -  GFR >60.00 mL/min - - - - -   BP/Weight 04/11/2017 01/21/2017 01/17/2017 08/30/2016 7/58/8325  Systolic BP 498 264 158 309 407  Diastolic BP 54 60 62 70 50  Wt. (Lbs) 197.4 212.6 211.4 219 217.6  BMI 28.32 30.5 30.33 31.42 31.22   Foot/eye exam completion dates Latest Ref Rng & Units 01/21/2017 08/31/2016  Eye Exam No Retinopathy - No Retinopathy  Foot Form Completion - Done -  A1c is 6.2.  Paul [name]  reports that he quit smoking about 4 years ago. His smoking use included cigars. He quit after 20.00 years of use. He has never used smokeless tobacco. He reports that he does not drink alcohol or use drugs.     Assessment & Plan:    Controlled type 2 diabetes mellitus with diabetic nephropathy, without long-term current use of insulin ([place]) - Plan: metFORMIN (GLUCOPHAGE-XR) 500 MG  24 hr tablet  Hyperlipidemia associated with type 2 diabetes mellitus ([place])  Diabetic nephropathy associated with type 2 diabetes mellitus ([place])  Abdominal aortic aneurysm (AAA) without rupture ([name])  Hypertension complicating diabetes ([place]) - Plan: losartan (COZAAR) 25 MG tablet  Atherosclerosis of native coronary artery of native heart without angina pectoris - Plan: clopidogrel (PLAVIX) 75 MG tablet  Chronic gout of hand, unspecified cause, unspecified laterality - Plan: febuxostat (ULORIC) 40 MG tablet   1. Rx changes: cozaar 2. Education: Reviewed 'ABCs' of diabetes management (respective goals in parentheses):  A1C (<7), blood pressure (<130/80), and cholesterol (LDL <100). 3. Compliance at present is estimated to be good. Efforts to improve compliance (if necessary) will be directed at  Maintain the present drug regimen.  Plus adding Cozaar. 4. Follow up: 4 months Overall he is doing fairly well although the swelling of his joints is concerning.  He is to increase his colchicine to twice per day and use Tylenol.  He will call me in a week or 2 and let me know how this is going. He will continue to be followed by rheumatology as well as cardiology.  He is also scheduled for follow-up concerning the AAA.

## 2017-05-24 ENCOUNTER — Telehealth: Payer: Self-pay | Admitting: Family Medicine

## 2017-05-24 NOTE — Telephone Encounter (Signed)
Patient called and states since he is doubling his Colchicine he is back to normal and feeling fine.

## 2017-05-30 ENCOUNTER — Other Ambulatory Visit: Payer: Self-pay | Admitting: Family Medicine

## 2017-05-30 NOTE — Telephone Encounter (Signed)
Is this okay to refill? 

## 2017-06-01 ENCOUNTER — Ambulatory Visit (INDEPENDENT_AMBULATORY_CARE_PROVIDER_SITE_OTHER): Payer: Medicare Other | Admitting: Vascular Surgery

## 2017-06-01 ENCOUNTER — Encounter: Payer: Self-pay | Admitting: Vascular Surgery

## 2017-06-01 ENCOUNTER — Encounter: Payer: Medicare Other | Admitting: Vascular Surgery

## 2017-06-01 VITALS — BP 113/62 | HR 67 | Temp 97.1°F | Resp 18 | Ht 69.0 in | Wt 193.0 lb

## 2017-06-01 DIAGNOSIS — I714 Abdominal aortic aneurysm, without rupture, unspecified: Secondary | ICD-10-CM

## 2017-06-01 NOTE — Progress Notes (Signed)
Patient name: Paul [name] MRN: [id number] DOB: 08/30/1940 Sex: male   REASON FOR CONSULT:    Abdominal aortic aneurysm.  The patient is referred by Dr. Jill Alexanders.  HPI:   Paul [name] is a pleasant 77 y.o. male, who has been followed with an abdominal aortic aneurysm for many years according to the patient.  This had remained stable at approximately 3.7 cm.  He went for about a year before his next follow-up visit and the aneurysm had enlarged to 4.6 cm.  For this reason he was sent for vascular consultation.  He denies any history of abdominal pain or back pain.  There is no family history of aneurysmal disease that he is aware of.  He denies any claudication, rest pain, or history of nonhealing ulcers.  Past Medical History:  Diagnosis Date  . AAA (abdominal aortic aneurysm) ([place])   . Abnormal LFTs   . AKI (acute kidney injury) ([place])   . Anemia of chronic disease   . Arthritis    "all over" (12/04/2015)  . Atherosclerosis of native coronary artery of native heart without angina pectoris   . CAD (coronary artery disease)    with cath 09/26/08 showing severe heart disease in RCA with POBA, mild to mod diesease in circ and LAD  . Chronic back pain    back problems prior to surgery   . Constipation   . Coronary atherosclerosis of native coronary artery   . Dehydration   . Diabetes mellitus    with neuropathy  . Diarrhea   . Elevated liver enzymes   . Generalized weakness   . Gout of right hand   . Hiatal hernia   . History of gastroesophageal reflux (GERD)   . History of stomach ulcers   . Hyperlipidemia   . Hypertension   . Metabolic acidosis   . Normochromic anemia 02/23/2016  . 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 Thrombocytopenia ([place])   . Vitamin B12 deficiency   . Vitamin B12 deficiency   . Vitamin D deficiency   . Weakness     Family History  Problem Relation Age of Onset  . Heart attack Mother   . Cancer Mother   . Heart disease Mother   .  Heart attack Sister   . Heart disease Sister     SOCIAL HISTORY: Social History   Socioeconomic History  . Marital status: Married    Spouse name: Not on file  . Number of children: Not on file  . Years of education: Not on file  . Highest education level: Not on file  Occupational History  . Not on file  Social Needs  . Financial resource strain: Not on file  . Food insecurity:    Worry: Not on file    Inability: Not on file  . Transportation needs:    Medical: Not on file    Non-medical: Not on file  Tobacco Use  . Smoking status: Former Smoker    Years: 20.00    Types: Cigars    Last attempt to quit: 01/24/2013    Years since quitting: 4.3  . Smokeless tobacco: Never Used  Substance and Sexual Activity  . Alcohol use: No    Comment: 12/04/2015 "nothing in 3-4 years"  . Drug use: No  . Sexual activity: Not on file  Lifestyle  . Physical activity:    Days per week: Not on file    Minutes per session: Not on file  . Stress: Not on  file  Relationships  . Social connections:    Talks on phone: Not on file    Gets together: Not on file    Attends religious service: Not on file    Active member of club or organization: Not on file    Attends meetings of clubs or organizations: Not on file    Relationship status: Not on file  . Intimate partner violence:    Fear of current or ex partner: Not on file    Emotionally abused: Not on file    Physically abused: Not on file    Forced sexual activity: Not on file  Other Topics Concern  . Not on file  Social History Narrative   Divorced, 1 child.    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    Owned roofing co. In [place]    Allergies  Allergen Reactions  . Allopurinol Other (See Comments)    thrombocytopenia    Current Outpatient Medications  Medication Sig Dispense Refill  . ACCU-CHEK AVIVA PLUS test strip Test once to twice daily 200 each 4  . aspirin 81 MG chewable tablet Chew 81 mg  by mouth daily.    . clopidogrel (PLAVIX) 75 MG tablet Take 1 tablet (75 mg total) by mouth daily. 90 tablet 3  . colchicine 0.6 MG tablet TAKE 1 TABLET BY MOUTH  DAILY 90 tablet 1  . febuxostat (ULORIC) 40 MG tablet Take 1 tablet (40 mg total) by mouth daily. 90 tablet 3  . ferrous sulfate 325 (65 FE) MG tablet Take 325 mg by mouth daily with breakfast.    . fish oil-omega-3 fatty acids 1000 MG capsule Take 1 g by mouth 2 (two) times daily.     . isosorbide mononitrate (IMDUR) 30 MG 24 hr tablet TAKE 1 TABLET BY MOUTH  TWICE A DAY 180 tablet 2  . losartan (COZAAR) 25 MG tablet Take 1 tablet (25 mg total) by mouth daily. 90 tablet 3  . metFORMIN (GLUCOPHAGE-XR) 500 MG 24 hr tablet Take 2 tablets (1,000 mg total) by mouth daily. 180 tablet 1  . metoprolol tartrate (LOPRESSOR) 25 MG tablet TAKE 1 TABLET BY MOUTH TWO  TIMES DAILY 180 tablet 1  . Multiple Vitamins-Minerals (CENTRUM) tablet Take 1 tablet by mouth daily.    . nitroGLYCERIN (NITROSTAT) 0.4 MG SL tablet Place 0.4 mg under the tongue every 5 (five) minutes as needed for chest pain. For 3 doses    . simvastatin (ZOCOR) 20 MG tablet TAKE 1 TABLET BY MOUTH AT  BEDTIME 90 tablet 3  . traMADol (ULTRAM) 50 MG tablet Take 1 tablet (50 mg total) by mouth every 8 (eight) hours as needed. 30 tablet 0  . vitamin B-12 (CYANOCOBALAMIN) 1000 MCG tablet Take 1 tablet (1,000 mcg total) by mouth daily. 90 tablet 3   No current facility-administered medications for this visit.     REVIEW OF SYSTEMS:  [X]  denotes positive finding, [ ]  denotes negative finding Cardiac  Comments:  Chest pain or chest pressure:    Shortness of breath upon exertion:    Short of breath when lying flat:    Irregular heart rhythm:        Vascular    Pain in calf, thigh, or hip brought on by ambulation:    Pain in feet at night that wakes you up from your sleep:     Blood clot in your veins:    Leg swelling:  Pulmonary    Oxygen at home:    Productive cough:      Wheezing:         Neurologic    Sudden weakness in arms or legs:     Sudden numbness in arms or legs:     Sudden onset of difficulty speaking or slurred speech:    Temporary loss of vision in one eye:     Problems with dizziness:         Gastrointestinal    Blood in stool:     Vomited blood:         Genitourinary    Burning when urinating:     Blood in urine:        Psychiatric    Major depression:         Hematologic    Bleeding problems:    Problems with blood clotting too easily:        Skin    Rashes or ulcers:        Constitutional    Fever or chills:     PHYSICAL EXAM:   Vitals:   06/01/17 1302  BP: 113/62  Pulse: 67  Resp: 18  Temp: (!) 97.1 F (36.2 C)  TempSrc: Oral  SpO2: 97%  Weight: 193 lb (87.5 kg)  Height: 5\' 9"  (1.753 m)    GENERAL: The patient is a well-nourished male, in no acute distress. The vital signs are documented above. CARDIAC: There is a regular rate and rhythm.  VASCULAR: I do not detect carotid bruits. On the right side he has a palpable femoral, popliteal, and posterior tibial pulse.  I cannot palpate a dorsalis pedis pulse. On the left side, he has a palpable femoral, popliteal, posterior tibial pulse.  I cannot palpate dorsalis pedis pulse. He has mild bilateral lower extremity swelling. PULMONARY: There is good air exchange bilaterally without wheezing or rales. ABDOMEN: Soft and non-tender with normal pitched bowel sounds.  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 NEUROLOGIC: No focal weakness or paresthesias are detected. SKIN: There are no ulcers or rashes noted. PSYCHIATRIC: The patient has a normal affect.  DATA:    LABS: Hemoglobin on 04/11/2017 was 9.8.  Creatinine 1.02 with a GFR of 72.  ULTRASOUND ABDOMINAL AORTA: I have reviewed the ultrasound of the abdominal aorta that was done on 04/18/2017.  This shows that the maximum diameter of the  aneurysm 4.6 cm in maximum diameter.  MEDICAL ISSUES:   ASYMPTOMATIC 4.6 CM INFRARENAL ABDOMINAL AORTIC ANEURYSM: This patient has an asymptomatic 4.6 cm infrarenal abdominal aortic aneurysm.  This had been relatively stable for many years at 3.7 cm but increased about a centimeter over a period of slightly over a year.  I have explained that in a normal risk patient we would consider elective repair at 5.5 cm.  The risk of rupture for aneurysm at this size is smaller than the risk of repair.  I would recommend following this at six-month intervals given the enlargement and given its size.  I have ordered a follow-up duplex scan in 6 months and I will see him back at that time.  If the aneurysm continues to enlarge that he will need a CT angiogram to help [place] determine if he is a candidate for endovascular repair if the aneurysm continues to enlarge.  His blood pressure is under good control and he is not a smoker.  I explained that these  of the 2 factors that would put somewhat at risk for increased aneurysm expansion.  I will see him back in 6 months.  He knows to call sooner if he has problems.  Deitra Mayo [facility] [phone]

## 2017-06-02 ENCOUNTER — Encounter: Payer: Medicare Other | Admitting: Vascular Surgery

## 2017-06-02 DIAGNOSIS — D696 Thrombocytopenia, unspecified: Secondary | ICD-10-CM | POA: Diagnosis not present

## 2017-06-02 DIAGNOSIS — M25562 Pain in left knee: Secondary | ICD-10-CM | POA: Diagnosis not present

## 2017-06-02 DIAGNOSIS — N183 Chronic kidney disease, stage 3 (moderate): Secondary | ICD-10-CM | POA: Diagnosis not present

## 2017-06-02 DIAGNOSIS — I82A11 Acute embolism and thrombosis of right axillary vein: Secondary | ICD-10-CM | POA: Diagnosis not present

## 2017-06-02 DIAGNOSIS — M109 Gout, unspecified: Secondary | ICD-10-CM | POA: Diagnosis not present

## 2017-06-09 ENCOUNTER — Telehealth: Payer: Self-pay | Admitting: Family Medicine

## 2017-06-09 NOTE — Telephone Encounter (Signed)
Pt called and wanted to let you know that his rheumatologist dr changed his ULORIC RX to 80 MG because his Uric acid was 6 he used wanted to make sure you was aware  Pt can be reached at [phone]

## 2017-07-09 ENCOUNTER — Other Ambulatory Visit: Payer: Self-pay | Admitting: Cardiovascular Disease

## 2017-07-14 ENCOUNTER — Other Ambulatory Visit: Payer: Self-pay

## 2017-07-14 DIAGNOSIS — I714 Abdominal aortic aneurysm, without rupture, unspecified: Secondary | ICD-10-CM

## 2017-08-31 DIAGNOSIS — Z9889 Other specified postprocedural states: Secondary | ICD-10-CM | POA: Diagnosis not present

## 2017-08-31 DIAGNOSIS — H4321 Crystalline deposits in vitreous body, right eye: Secondary | ICD-10-CM | POA: Diagnosis not present

## 2017-08-31 DIAGNOSIS — H25813 Combined forms of age-related cataract, bilateral: Secondary | ICD-10-CM | POA: Diagnosis not present

## 2017-08-31 DIAGNOSIS — E119 Type 2 diabetes mellitus without complications: Secondary | ICD-10-CM | POA: Diagnosis not present

## 2017-08-31 LAB — HM DIABETES EYE EXAM

## 2017-09-09 ENCOUNTER — Encounter: Payer: Self-pay | Admitting: Family Medicine

## 2017-09-19 ENCOUNTER — Ambulatory Visit (INDEPENDENT_AMBULATORY_CARE_PROVIDER_SITE_OTHER): Payer: Medicare Other | Admitting: Family Medicine

## 2017-09-19 ENCOUNTER — Encounter: Payer: Self-pay | Admitting: Family Medicine

## 2017-09-19 VITALS — BP 118/40 | HR 60 | Temp 98.4°F | Ht 69.0 in | Wt 187.0 lb

## 2017-09-19 DIAGNOSIS — E1121 Type 2 diabetes mellitus with diabetic nephropathy: Secondary | ICD-10-CM | POA: Diagnosis not present

## 2017-09-19 DIAGNOSIS — R350 Frequency of micturition: Secondary | ICD-10-CM | POA: Diagnosis not present

## 2017-09-19 DIAGNOSIS — I10 Essential (primary) hypertension: Secondary | ICD-10-CM

## 2017-09-19 DIAGNOSIS — H269 Unspecified cataract: Secondary | ICD-10-CM | POA: Insufficient documentation

## 2017-09-19 DIAGNOSIS — E1169 Type 2 diabetes mellitus with other specified complication: Secondary | ICD-10-CM | POA: Diagnosis not present

## 2017-09-19 DIAGNOSIS — I959 Hypotension, unspecified: Secondary | ICD-10-CM | POA: Diagnosis not present

## 2017-09-19 DIAGNOSIS — E1159 Type 2 diabetes mellitus with other circulatory complications: Secondary | ICD-10-CM

## 2017-09-19 DIAGNOSIS — I152 Hypertension secondary to endocrine disorders: Secondary | ICD-10-CM

## 2017-09-19 DIAGNOSIS — M1A0490 Idiopathic chronic gout, unspecified hand, without tophus (tophi): Secondary | ICD-10-CM

## 2017-09-19 DIAGNOSIS — N3001 Acute cystitis with hematuria: Secondary | ICD-10-CM | POA: Diagnosis not present

## 2017-09-19 DIAGNOSIS — E785 Hyperlipidemia, unspecified: Secondary | ICD-10-CM

## 2017-09-19 DIAGNOSIS — R634 Abnormal weight loss: Secondary | ICD-10-CM

## 2017-09-19 LAB — POCT URINALYSIS DIP (PROADVANTAGE DEVICE)
Bilirubin, UA: NEGATIVE
Glucose, UA: NEGATIVE mg/dL
Ketones, POC UA: NEGATIVE mg/dL
Nitrite, UA: POSITIVE — AB
Protein Ur, POC: 100 mg/dL — AB
Urobilinogen, Ur: NEGATIVE
pH, UA: 6 (ref 5.0–8.0)

## 2017-09-19 LAB — POCT GLYCOSYLATED HEMOGLOBIN (HGB A1C): Hemoglobin A1C: 5.3 % (ref 4.0–5.6)

## 2017-09-19 MED ORDER — SULFAMETHOXAZOLE-TRIMETHOPRIM 800-160 MG PO TABS: 1.0000 | ORAL_TABLET | Freq: Two times a day (BID) | ORAL | 0 refills | Status: DC

## 2017-09-19 NOTE — Progress Notes (Signed)
   Subjective:    Patient ID: Paul [name], male    DOB: 1940/06/22, 77 y.o.   MRN: [id number]  HPI He is here for a follow-up visit concerning his diabetes however he is complaining of a several month history of weight loss with anorexia.  He did have a colonoscopy in 2011.  He also complains of urinary frequency, dysuria and nocturia.  No fever, chills, abdominal pain.  He has been checking his blood sugars regularly and they are in the low 100s.  He has had no difficulty with his feet.  He is getting ready to see ophthalmology and is due for bilateral cataract surgery.  Continues on colchicine twice per day on an as-needed basis for treatment of his gout.  He has followed up concerning his AAA.  His most recent reading was 4.6. A1c is 5.3  Review of Systems     Objective:   Physical Exam Alert and in no distress.  Cardiac exam shows regular rhythm with bradycardia.  Blood pressure diastolic is low.  Urine dipstick was positive.       Assessment & Plan:  Hyperlipidemia associated with type 2 diabetes mellitus ([place]) - Plan: Lipid panel  Diabetic nephropathy associated with type 2 diabetes mellitus ([place]) - Plan: HgB A1c, CBC with Differential/Platelet, Comprehensive metabolic panel  Hypertension complicating diabetes ([place]) - Plan: CBC with Differential/Platelet, Comprehensive metabolic panel  Chronic gout of hand, unspecified cause, unspecified laterality  Frequent urination - Plan: POCT Urinalysis DIP (Proadvantage Device), Urine Culture  Acute cystitis with hematuria - Plan: Urine Culture, sulfamethoxazole-trimethoprim (BACTRIM DS,SEPTRA DS) 800-160 MG tablet  Hypotension, unspecified hypotension type - Plan: EKG 12-Lead  Weight loss - Plan: CBC with Differential/Platelet, Comprehensive metabolic panel, Lipid panel  Cataract of both eyes, unspecified cataract type  I will go ahead and treat the urinary tract infection.  He will follow-up with me in that in a couple weeks.   We will probably readjust his metformin as his most recent numbers are quite good.  Concerning that he is having the weight loss and will probably refer to GI for that.  I will have him hold the Cozaar.  Close follow-up concerning all these issues.

## 2017-09-19 NOTE — Patient Instructions (Addendum)
Periodically check it 2 hours after meal and see what those numbers Hold taking your Cozaar

## 2017-09-20 LAB — COMPREHENSIVE METABOLIC PANEL
ALT: 23 IU/L (ref 0–44)
AST: 29 IU/L (ref 0–40)
Albumin/Globulin Ratio: 0.8 — ABNORMAL LOW (ref 1.2–2.2)
Albumin: 3.2 g/dL — ABNORMAL LOW (ref 3.5–4.8)
Alkaline Phosphatase: 106 IU/L (ref 39–117)
BUN/Creatinine Ratio: 18 (ref 10–24)
BUN: 23 mg/dL (ref 8–27)
Bilirubin Total: 0.7 mg/dL (ref 0.0–1.2)
CO2: 20 mmol/L (ref 20–29)
Calcium: 9.1 mg/dL (ref 8.6–10.2)
Chloride: 104 mmol/L (ref 96–106)
Creatinine, Ser: 1.25 mg/dL (ref 0.76–1.27)
GFR calc Af Amer: 65 mL/min/{1.73_m2} (ref >59)
GFR calc non Af Amer: 56 mL/min/{1.73_m2} — ABNORMAL LOW (ref >59)
Globulin, Total: 3.8 g/dL (ref 1.5–4.5)
Glucose: 116 mg/dL — ABNORMAL HIGH (ref 65–99)
Potassium: 4.3 mmol/L (ref 3.5–5.2)
Sodium: 138 mmol/L (ref 134–144)
Total Protein: 7 g/dL (ref 6.0–8.5)

## 2017-09-20 LAB — CBC WITH DIFFERENTIAL/PLATELET
Basophils Absolute: 0.1 10*3/uL (ref 0.0–0.2)
Basos: 1 %
EOS (ABSOLUTE): 0.2 10*3/uL (ref 0.0–0.4)
Eos: 1 %
Hematocrit: 25 % — ABNORMAL LOW (ref 37.5–51.0)
Hemoglobin: 8.5 g/dL — ABNORMAL LOW (ref 13.0–17.7)
Immature Grans (Abs): 0.2 10*3/uL — ABNORMAL HIGH (ref 0.0–0.1)
Immature Granulocytes: 1 %
Lymphocytes Absolute: 2.9 10*3/uL (ref 0.7–3.1)
Lymphs: 20 %
MCH: 33.3 pg — ABNORMAL HIGH (ref 26.6–33.0)
MCHC: 34 g/dL (ref 31.5–35.7)
MCV: 98 fL — ABNORMAL HIGH (ref 79–97)
Monocytes Absolute: 0.9 10*3/uL (ref 0.1–0.9)
Monocytes: 6 %
Neutrophils Absolute: 10.1 10*3/uL — ABNORMAL HIGH (ref 1.4–7.0)
Neutrophils: 71 %
Platelets: 397 10*3/uL (ref 150–450)
RBC: 2.55 x10E6/uL — CL (ref 4.14–5.80)
RDW: 27 % — ABNORMAL HIGH (ref 12.3–15.4)
WBC: 14.3 10*3/uL — ABNORMAL HIGH (ref 3.4–10.8)

## 2017-09-20 LAB — LIPID PANEL
Chol/HDL Ratio: 2.4 ratio (ref 0.0–5.0)
Cholesterol, Total: 83 mg/dL — ABNORMAL LOW (ref 100–199)
HDL: 34 mg/dL — ABNORMAL LOW (ref >39)
LDL Calculated: 32 mg/dL (ref 0–99)
Triglycerides: 84 mg/dL (ref 0–149)
VLDL Cholesterol Cal: 17 mg/dL (ref 5–40)

## 2017-09-21 ENCOUNTER — Other Ambulatory Visit: Payer: Self-pay

## 2017-09-21 ENCOUNTER — Telehealth: Payer: Self-pay | Admitting: Family Medicine

## 2017-09-21 LAB — URINE CULTURE

## 2017-09-21 NOTE — Telephone Encounter (Signed)
Called pt to tell him Dr Redmond School wanted to see him Paul [name] or Fri this week for bp f/u and pt said he is out of town until Sunday night and then has cataract surgery on Monday morning.  Sched him for here on Tues am. Also advised pt to expect a call from [name].

## 2017-09-22 ENCOUNTER — Other Ambulatory Visit: Payer: Self-pay | Admitting: Oncology

## 2017-09-22 DIAGNOSIS — M1A0490 Idiopathic chronic gout, unspecified hand, without tophus (tophi): Secondary | ICD-10-CM

## 2017-09-22 DIAGNOSIS — D638 Anemia in other chronic diseases classified elsewhere: Secondary | ICD-10-CM

## 2017-09-22 DIAGNOSIS — D890 Polyclonal hypergammaglobulinemia: Secondary | ICD-10-CM

## 2017-09-23 ENCOUNTER — Other Ambulatory Visit: Payer: Self-pay

## 2017-09-23 DIAGNOSIS — M1A0490 Idiopathic chronic gout, unspecified hand, without tophus (tophi): Secondary | ICD-10-CM

## 2017-09-24 ENCOUNTER — Other Ambulatory Visit: Payer: Self-pay | Admitting: Family Medicine

## 2017-09-24 DIAGNOSIS — E1121 Type 2 diabetes mellitus with diabetic nephropathy: Secondary | ICD-10-CM

## 2017-09-26 DIAGNOSIS — H2512 Age-related nuclear cataract, left eye: Secondary | ICD-10-CM | POA: Diagnosis not present

## 2017-09-26 DIAGNOSIS — H25812 Combined forms of age-related cataract, left eye: Secondary | ICD-10-CM | POA: Diagnosis not present

## 2017-09-27 ENCOUNTER — Telehealth: Payer: Self-pay | Admitting: *Deleted

## 2017-09-27 ENCOUNTER — Other Ambulatory Visit: Payer: Self-pay

## 2017-09-27 ENCOUNTER — Encounter: Payer: Self-pay | Admitting: Oncology

## 2017-09-27 ENCOUNTER — Ambulatory Visit (INDEPENDENT_AMBULATORY_CARE_PROVIDER_SITE_OTHER): Payer: Medicare Other | Admitting: Family Medicine

## 2017-09-27 ENCOUNTER — Ambulatory Visit (INDEPENDENT_AMBULATORY_CARE_PROVIDER_SITE_OTHER): Payer: Medicare Other | Admitting: Oncology

## 2017-09-27 ENCOUNTER — Encounter: Payer: Self-pay | Admitting: Family Medicine

## 2017-09-27 ENCOUNTER — Ambulatory Visit (HOSPITAL_COMMUNITY)
Admission: RE | Admit: 2017-09-27 | Discharge: 2017-09-27 | Disposition: A | Discharge status: Home / Self Care | Payer: Medicare Other | Source: Ambulatory Visit | Attending: Oncology | Admitting: Oncology

## 2017-09-27 VITALS — BP 112/52 | HR 61 | Temp 97.7°F | Resp 20 | Wt 187.2 lb

## 2017-09-27 VITALS — BP 133/40 | HR 64 | Temp 98.6°F | Ht 70.0 in | Wt 187.2 lb

## 2017-09-27 DIAGNOSIS — D890 Polyclonal hypergammaglobulinemia: Secondary | ICD-10-CM | POA: Diagnosis not present

## 2017-09-27 DIAGNOSIS — R634 Abnormal weight loss: Secondary | ICD-10-CM

## 2017-09-27 DIAGNOSIS — K7460 Unspecified cirrhosis of liver: Secondary | ICD-10-CM

## 2017-09-27 DIAGNOSIS — I959 Hypotension, unspecified: Secondary | ICD-10-CM | POA: Diagnosis not present

## 2017-09-27 DIAGNOSIS — D72829 Elevated white blood cell count, unspecified: Secondary | ICD-10-CM

## 2017-09-27 DIAGNOSIS — D473 Essential (hemorrhagic) thrombocythemia: Secondary | ICD-10-CM | POA: Diagnosis not present

## 2017-09-27 DIAGNOSIS — R3121 Asymptomatic microscopic hematuria: Secondary | ICD-10-CM

## 2017-09-27 DIAGNOSIS — D649 Anemia, unspecified: Secondary | ICD-10-CM

## 2017-09-27 DIAGNOSIS — J90 Pleural effusion, not elsewhere classified: Secondary | ICD-10-CM | POA: Insufficient documentation

## 2017-09-27 DIAGNOSIS — Z720 Tobacco use: Secondary | ICD-10-CM

## 2017-09-27 DIAGNOSIS — J449 Chronic obstructive pulmonary disease, unspecified: Secondary | ICD-10-CM | POA: Insufficient documentation

## 2017-09-27 DIAGNOSIS — Z87440 Personal history of urinary (tract) infections: Secondary | ICD-10-CM

## 2017-09-27 DIAGNOSIS — D638 Anemia in other chronic diseases classified elsewhere: Secondary | ICD-10-CM

## 2017-09-27 DIAGNOSIS — M1A0490 Idiopathic chronic gout, unspecified hand, without tophus (tophi): Secondary | ICD-10-CM | POA: Diagnosis not present

## 2017-09-27 DIAGNOSIS — M109 Gout, unspecified: Secondary | ICD-10-CM | POA: Diagnosis not present

## 2017-09-27 DIAGNOSIS — Z888 Allergy status to other drugs, medicaments and biological substances status: Secondary | ICD-10-CM

## 2017-09-27 DIAGNOSIS — Z961 Presence of intraocular lens: Secondary | ICD-10-CM

## 2017-09-27 DIAGNOSIS — Z9842 Cataract extraction status, left eye: Secondary | ICD-10-CM

## 2017-09-27 DIAGNOSIS — F17200 Nicotine dependence, unspecified, uncomplicated: Secondary | ICD-10-CM | POA: Insufficient documentation

## 2017-09-27 DIAGNOSIS — Z6826 Body mass index (BMI) 26.0-26.9, adult: Secondary | ICD-10-CM

## 2017-09-27 DIAGNOSIS — Z86718 Personal history of other venous thrombosis and embolism: Secondary | ICD-10-CM

## 2017-09-27 DIAGNOSIS — R05 Cough: Secondary | ICD-10-CM | POA: Diagnosis not present

## 2017-09-27 DIAGNOSIS — Z79899 Other long term (current) drug therapy: Secondary | ICD-10-CM

## 2017-09-27 HISTORY — DX: Abnormal weight loss: R63.4

## 2017-09-27 LAB — SAVE SMEAR

## 2017-09-27 NOTE — Telephone Encounter (Signed)
Call from [place] at [place] - pt has labs ordered by Dr Beryle Beams and wants to know when should they be done?

## 2017-09-27 NOTE — Progress Notes (Signed)
Hematology and Oncology Follow Up Visit  Paul [name] [id number] 09-29-1940 77 y.o. 09/27/2017 4:35 PM   Principle Diagnosis: Encounter Diagnoses  Name Primary?  . Normochromic anemia Yes  . Cirrhosis of liver without ascites, unspecified hepatic cirrhosis type ([place])   . Asymptomatic microscopic hematuria   . Abnormal weight loss   . Anemia of chronic disease   . Polyclonal gammopathy      Interim History: Dr. Redmond School asked me to reevaluate this patient who I initially saw in consultation when he was hospitalized in November 2017.  He had an acute right lower extremity DVT with a concomitant acute gout attack.  He was thrombocytopenic and had acute liver enzyme elevations.  We were able to determine that he was having an allergic reaction to allopurinol resulting in both the acute hepatitis and the thrombocytopenia.  Both resolved when the drug was stopped.  I last saw him for a follow-up visit in April 2018.  He was doing well.  Liver functions normal and platelet count 187,000.  He had a persistent normochromic anemia.  He had a mild elevation of IgG immunoglobulin and borderline increase in serum free light chain ratio but immunofixation electrophoresis showed a polyclonal pattern consistent with chronic inflammation and I felt his anemia was most likely due to anemia of chronic disease (gout).  Hemoglobin in July 2018 was 10 g.  Stable in March of this year at 9.9 g but at time of visit to his primary care physician on August 19, hemoglobin had fallen to 8.5 with MCV 98, in addition over the last few months he has had a rise in his white count up to 14,000 and platelet count, 397,000.  Gout is currently under control.  He is on colchicine and Uloric.  He reports a recent urinary tract infection about 2 weeks ago treated with antibiotics. Of more concern, he has lost 33 pounds over the last 4 months since April.  Food does not taste good to him anymore.  Along with the weight loss,  generalized fatigue and weakness.  Difficulty walking due to weak legs. He admits to an intermittent hacking cough which he says is not changed from his baseline.  No hemoptysis.  He smokes 4 or 5 cigarettes daily. No dysphagia.  No change in bowel habit.  No hematochezia or melena. He states prostate exam normal just a few months ago. He has not noted any hematuria but I did notice that a follow-up urine specimen done on August 19 shows significant microscopic hematuria and proteinuria without leukocytosis or bacteria. He denies any bone pain. He denies any history of thyroid disease. Cardiac status is stable and he had a recent good report from his cardiologist.  Medications: reviewed  Allergies:  Allergies  Allergen Reactions  . Allopurinol Other (See Comments)    thrombocytopenia    Review of Systems: See interim history He just had an intraocular lens placed and a left cataract removed yesterday Remaining ROS negative:   Physical Exam: Blood pressure (!) 133/40, pulse 64, temperature 98.6 F (37 C), temperature source Oral, height 5\' 10"  (1.778 m), weight 187 lb 3.2 oz (84.9 kg), SpO2 100 %. Wt Readings from Last 3 Encounters:  09/27/17 187 lb 3.2 oz (84.9 kg)  09/27/17 187 lb 3.2 oz (84.9 kg)  09/19/17 187 lb (84.8 kg)     General appearance: Pale elderly Caucasian man HENNT: Pharynx no erythema, exudate, mass, or ulcer. No thyromegaly or thyroid nodules Lymph nodes: No cervical, supraclavicular, or  axillary lymphadenopathy Breasts: Lungs: Clear to auscultation, resonant to percussion throughout Heart: Regular rhythm, no murmur, no gallop, no rub, no click, no edema Abdomen: Soft, nontender, normal bowel sounds, no mass, no organomegaly Extremities: No edema, no calf tenderness Musculoskeletal: no joint deformities GU: Not examined Rectal exam: Prostate small and flat.  No nodules.  Stool dark brown but guaiac negative. Vascular: Carotid pulses 2+, no  bruits, Neurologic: Alert, oriented, PERRLA, , cranial nerves grossly normal, motor strength 5 over 5, reflexes 1+ symmetric, upper body coordination normal, gait normal, Skin: No rash or ecchymosis  Lab Results: CBC W/Diff    Component Value Date/Time   WBC 14.3 (H) 09/19/2017 1037   WBC 10.9 (H) 08/30/2016 1406   RBC 2.55 (LL) 09/19/2017 1037   RBC 3.21 (L) 08/30/2016 1406   HGB 8.5 (L) 09/19/2017 1037   HCT 25.0 (L) 09/19/2017 1037   PLT 397 09/19/2017 1037   MCV 98 (H) 09/19/2017 1037   MCH 33.3 (H) 09/19/2017 1037   MCH 31.2 08/30/2016 1406   MCHC 34.0 09/19/2017 1037   MCHC 33.3 08/30/2016 1406   RDW 27.0 (H) 09/19/2017 1037   LYMPHSABS 2.9 09/19/2017 1037   MONOABS 545 08/30/2016 1406   EOSABS 0.2 09/19/2017 1037   BASOSABS 0.1 09/19/2017 1037     Chemistry      Component Value Date/Time   NA 138 09/19/2017 1037   K 4.3 09/19/2017 1037   CL 104 09/19/2017 1037   CO2 20 09/19/2017 1037   BUN 23 09/19/2017 1037   CREATININE 1.25 09/19/2017 1037   CREATININE 0.96 08/30/2016 1406   GLU 123 04/25/2015      Component Value Date/Time   CALCIUM 9.1 09/19/2017 1037   ALKPHOS 106 09/19/2017 1037   AST 29 09/19/2017 1037   ALT 23 09/19/2017 1037   BILITOT 0.7 09/19/2017 1037       Radiological Studies: No results found.  Impression: Unintentional weight loss, anemia, leukocytosis, and thrombocytosis. This is a disease prescription for possible occult malignancy. Although he has no localizing physical findings, he is at risk for lung cancer in view of his chronic smoking history.  He is also at risk for and liver and pancreatic cancer.  CT scan angiogram of the chest done in 2017 showed part of his liver which had a nodular contour suggestive of cirrhosis and the patient admitted to me today he was a very heavy alcohol user in the past. He has not had a colonoscopy in 8 years.  Prior study was negative and stool is guaiac negative today but that does not exclude  possibility of a GI primary. Although the microscopic hematuria may be the result of recent urinary tract infection, the bladder can also be another site of occult malignancy.  Plan: I voiced my concerns to the patient that it was possible that he had an underlying hidden cancer. I am going to get a chest x-ray today.  I am scheduling him for a CT scan of the abdomen and pelvis to look at his liver, pancreas, and bladder. I am checking an alpha-fetoprotein level. Further evaluation based on results of the above. I discussed my impressions with Dr. Redmond School.   CC: Patient Care Team: Denita Lung, MD as PCP - General (Family Medicine)   Murriel Hopper, MD, [place]  Hematology-Oncology/Internal Medicine     8/27/20194:35 PM

## 2017-09-27 NOTE — Progress Notes (Signed)
   Subjective:    Patient ID: Paul [name], male    DOB: [birth year], 77 y.o.   MRN: [id number]  HPI He is here for a recheck.  Since last being seen he did have cataract surgery.  He states that he was never contacted concerning seeing [name] or having blood work done.  He continues to feel quite weak and has lost approximately 25 pounds since December.   Review of Systems     Objective:   Physical Exam Alert and quite pale appearing.  Blood pressure is recorded.       Assessment & Plan:  Weight loss  Polyclonal gammopathy  Hypotension, unspecified hypotension type  Anemia, unspecified type  Blood was drawn today as per recommendation for [name].  His office was called and they state that they will call him to set up an appointment.  I did have him hold his losartan.  I think his blood pressure issues are related to an underlying gammopathy versus cancer

## 2017-09-27 NOTE — Telephone Encounter (Signed)
Pt was seen today by Dr Beryle Beams and labs were done.

## 2017-09-27 NOTE — Patient Instructions (Addendum)
To lab today Return visit 2 weeks to review results

## 2017-09-27 NOTE — Patient Instructions (Signed)
Hold taking the losartan for now

## 2017-09-27 NOTE — Telephone Encounter (Signed)
He was supposed to see me here yesterday & have labs. What happened?

## 2017-09-28 LAB — IMMUNOFIXATION ELECTROPHORESIS
IgA/Immunoglobulin A, Serum: 459 mg/dL — ABNORMAL HIGH (ref 61–437)
IgG (Immunoglobin G), Serum: 2156 mg/dL — ABNORMAL HIGH (ref 700–1600)
IgM (Immunoglobulin M), Srm: 88 mg/dL (ref 15–143)
Total Protein: 7.3 g/dL (ref 6.0–8.5)

## 2017-09-28 LAB — CBC WITH DIFFERENTIAL/PLATELET
Basophils Absolute: 0.1 10*3/uL (ref 0.0–0.2)
Basos: 1 %
EOS (ABSOLUTE): 0.3 10*3/uL (ref 0.0–0.4)
Eos: 3 %
Hematocrit: 24.5 % — ABNORMAL LOW (ref 37.5–51.0)
Hemoglobin: 8.3 g/dL — ABNORMAL LOW (ref 13.0–17.7)
Immature Grans (Abs): 0 10*3/uL (ref 0.0–0.1)
Immature Granulocytes: 0 %
Lymphocytes Absolute: 2.1 10*3/uL (ref 0.7–3.1)
Lymphs: 22 %
MCH: 33.3 pg — ABNORMAL HIGH (ref 26.6–33.0)
MCHC: 33.9 g/dL (ref 31.5–35.7)
MCV: 98 fL — ABNORMAL HIGH (ref 79–97)
Monocytes Absolute: 0.5 10*3/uL (ref 0.1–0.9)
Monocytes: 5 %
Neutrophils Absolute: 6.7 10*3/uL (ref 1.4–7.0)
Neutrophils: 69 %
Platelets: 343 10*3/uL (ref 150–450)
RBC: 2.49 x10E6/uL — CL (ref 4.14–5.80)
RDW: 29.4 % — ABNORMAL HIGH (ref 12.3–15.4)
WBC: 9.6 10*3/uL (ref 3.4–10.8)

## 2017-09-28 LAB — MICROSCOPIC EXAMINATION

## 2017-09-28 LAB — URINALYSIS, COMPLETE
Bilirubin, UA: NEGATIVE
Glucose, UA: NEGATIVE
Nitrite, UA: NEGATIVE
Protein, UA: NEGATIVE
RBC, UA: NEGATIVE
Specific Gravity, UA: 1.021 (ref 1.005–1.030)
Urobilinogen, Ur: 0.2 mg/dL (ref 0.2–1.0)
pH, UA: 5 (ref 5.0–7.5)

## 2017-09-28 LAB — FERRITIN: Ferritin: 304 ng/mL (ref 30–400)

## 2017-09-28 LAB — KAPPA/LAMBDA LIGHT CHAINS
Ig Kappa Free Light Chain: 185.7 mg/L — ABNORMAL HIGH (ref 3.3–19.4)
Ig Lambda Free Light Chain: 76.2 mg/L — ABNORMAL HIGH (ref 5.7–26.3)
Kappa/Lambda FluidC Ratio: 2.44 — ABNORMAL HIGH (ref 0.26–1.65)

## 2017-09-28 LAB — AFP TUMOR MARKER: AFP, Serum, Tumor Marker: 2.4 ng/mL (ref 0.0–8.3)

## 2017-09-28 LAB — IRON AND TIBC
Iron Saturation: 37 % (ref 15–55)
Iron: 91 ug/dL (ref 38–169)
Total Iron Binding Capacity: 243 ug/dL — ABNORMAL LOW (ref 250–450)
UIBC: 152 ug/dL (ref 111–343)

## 2017-09-28 LAB — LACTATE DEHYDROGENASE: LDH: 156 IU/L (ref 121–224)

## 2017-09-28 LAB — URIC ACID: Uric Acid: 4 mg/dL (ref 3.7–8.6)

## 2017-09-28 LAB — RETICULOCYTES: Retic Ct Pct: 2.4 % (ref 0.6–2.6)

## 2017-09-28 LAB — SEDIMENTATION RATE: Sed Rate: 16 mm/hr (ref 0–30)

## 2017-09-29 ENCOUNTER — Telehealth: Payer: Self-pay | Admitting: *Deleted

## 2017-09-29 LAB — URINE CULTURE: Organism ID, Bacteria: NO GROWTH

## 2017-09-29 NOTE — Telephone Encounter (Signed)
-----   Message from Annia Belt, MD sent at 09/28/2017  5:26 PM EDT ----- Call pt: CXR normal except some minor scar tissue on right; no obvious tumors. Urine now clean - no residual infection and no blood. Results sent to his primary Dr Redmond School

## 2017-09-29 NOTE — Telephone Encounter (Signed)
Pt called / informed "CXR normal except some minor scar tissue on right; no obvious tumors. Urine now clean - no residual infection and no blood. Results sent to his primary Dr Redmond School " per Dr Beryle Beams.  Stated he's glad to hear good news; he asked aboout CT scan - told him it will need to be authorized thru his ins co first then someone will call him.

## 2017-10-01 LAB — SPECIMEN STATUS REPORT

## 2017-10-01 LAB — HEPATITIS C ANTIBODY: Hep C Virus Ab: 0.2 s/co ratio (ref 0.0–0.9)

## 2017-10-04 ENCOUNTER — Encounter: Payer: Self-pay | Admitting: Family Medicine

## 2017-10-04 ENCOUNTER — Telehealth: Payer: Self-pay

## 2017-10-04 ENCOUNTER — Ambulatory Visit (INDEPENDENT_AMBULATORY_CARE_PROVIDER_SITE_OTHER): Payer: Medicare Other | Admitting: Family Medicine

## 2017-10-04 VITALS — BP 120/60 | HR 60 | Temp 98.2°F | Wt 183.2 lb

## 2017-10-04 DIAGNOSIS — D890 Polyclonal hypergammaglobulinemia: Secondary | ICD-10-CM

## 2017-10-04 DIAGNOSIS — R634 Abnormal weight loss: Secondary | ICD-10-CM

## 2017-10-04 DIAGNOSIS — D638 Anemia in other chronic diseases classified elsewhere: Secondary | ICD-10-CM | POA: Diagnosis not present

## 2017-10-04 NOTE — Telephone Encounter (Signed)
The patient has been notified of his appt made for 10/07/2017 with [facility] radiology.  Also instructions we given as to NPO 4 hrs prior and make sure that the Prep Kit was picked up prior to his appt.

## 2017-10-04 NOTE — Progress Notes (Signed)
   Subjective:    Patient ID: Paul [name], male    DOB: 24-Dec-1940, 77 y.o.   MRN: [id number]  HPI He is here for a recheck visit.  Presently he is holding his losartan but continues on Lopressor.  He was seen by [name].  All the blood work so far has been negative.  He was scheduled for a CT however I checked on it and it is scheduled for 1 week from today.   Review of Systems     Objective:   Physical Exam Alert and in no distress.  Blood pressure is recorded.       Assessment & Plan:  Chronic disease anemia  Polyclonal gammopathy  Weight loss I discussed the results of his blood work with him.  Also explained that we are now concerned over him having cancer.  He is aware of this.  We will attempt to get his CT scan scheduled sooner.

## 2017-10-04 NOTE — Telephone Encounter (Signed)
Pt was called to inform him that hia CT Scan hgas been moved up to 10-07-17 at 9:30 am . He was also advised that he would have to go by and pick up contrast before the ct scan. [place]

## 2017-10-05 DIAGNOSIS — H2511 Age-related nuclear cataract, right eye: Secondary | ICD-10-CM | POA: Diagnosis not present

## 2017-10-07 ENCOUNTER — Ambulatory Visit (HOSPITAL_COMMUNITY)
Admission: RE | Admit: 2017-10-07 | Discharge: 2017-10-07 | Disposition: A | Discharge status: Home / Self Care | Payer: Medicare Other | Source: Ambulatory Visit | Attending: Oncology | Admitting: Oncology

## 2017-10-07 DIAGNOSIS — I714 Abdominal aortic aneurysm, without rupture: Secondary | ICD-10-CM | POA: Insufficient documentation

## 2017-10-07 DIAGNOSIS — K7460 Unspecified cirrhosis of liver: Secondary | ICD-10-CM | POA: Diagnosis not present

## 2017-10-07 DIAGNOSIS — R634 Abnormal weight loss: Secondary | ICD-10-CM

## 2017-10-07 MED ORDER — IOPAMIDOL (ISOVUE-300) INJECTION 61%
INTRAVENOUS | Status: AC
  Filled 2017-10-07: qty 100

## 2017-10-07 MED ORDER — IOPAMIDOL (ISOVUE-300) INJECTION 61%
100.0000 mL | Freq: Once | INTRAVENOUS | Status: AC | PRN
  Administered 2017-10-07: 100 mL via INTRAVENOUS

## 2017-10-07 NOTE — Patient Instructions (Signed)
Please make sure the Metformin Home medication gets held for 48 hours if the patient gets admitted. A nursing communication order has been placed.  

## 2017-10-09 ENCOUNTER — Other Ambulatory Visit: Payer: Self-pay | Admitting: Cardiovascular Disease

## 2017-10-10 DIAGNOSIS — H2511 Age-related nuclear cataract, right eye: Secondary | ICD-10-CM | POA: Diagnosis not present

## 2017-10-10 DIAGNOSIS — H25811 Combined forms of age-related cataract, right eye: Secondary | ICD-10-CM | POA: Diagnosis not present

## 2017-10-11 ENCOUNTER — Ambulatory Visit (INDEPENDENT_AMBULATORY_CARE_PROVIDER_SITE_OTHER): Payer: Medicare Other | Admitting: Oncology

## 2017-10-11 ENCOUNTER — Ambulatory Visit (HOSPITAL_COMMUNITY): Payer: Medicare Other

## 2017-10-11 ENCOUNTER — Encounter: Payer: Self-pay | Admitting: Oncology

## 2017-10-11 ENCOUNTER — Other Ambulatory Visit: Payer: Self-pay

## 2017-10-11 VITALS — BP 118/45 | HR 56 | Temp 97.6°F | Ht 70.0 in | Wt 186.1 lb

## 2017-10-11 DIAGNOSIS — N182 Chronic kidney disease, stage 2 (mild): Secondary | ICD-10-CM

## 2017-10-11 DIAGNOSIS — Z87440 Personal history of urinary (tract) infections: Secondary | ICD-10-CM

## 2017-10-11 DIAGNOSIS — M109 Gout, unspecified: Secondary | ICD-10-CM

## 2017-10-11 DIAGNOSIS — D509 Iron deficiency anemia, unspecified: Secondary | ICD-10-CM | POA: Diagnosis not present

## 2017-10-11 DIAGNOSIS — M1A0490 Idiopathic chronic gout, unspecified hand, without tophus (tophi): Secondary | ICD-10-CM

## 2017-10-11 DIAGNOSIS — I714 Abdominal aortic aneurysm, without rupture, unspecified: Secondary | ICD-10-CM

## 2017-10-11 DIAGNOSIS — Z862 Personal history of diseases of the blood and blood-forming organs and certain disorders involving the immune mechanism: Secondary | ICD-10-CM

## 2017-10-11 DIAGNOSIS — K7460 Unspecified cirrhosis of liver: Secondary | ICD-10-CM | POA: Diagnosis not present

## 2017-10-11 DIAGNOSIS — R634 Abnormal weight loss: Secondary | ICD-10-CM

## 2017-10-11 DIAGNOSIS — D638 Anemia in other chronic diseases classified elsewhere: Secondary | ICD-10-CM

## 2017-10-11 DIAGNOSIS — D890 Polyclonal hypergammaglobulinemia: Secondary | ICD-10-CM

## 2017-10-11 DIAGNOSIS — Z888 Allergy status to other drugs, medicaments and biological substances status: Secondary | ICD-10-CM

## 2017-10-11 DIAGNOSIS — F17200 Nicotine dependence, unspecified, uncomplicated: Secondary | ICD-10-CM

## 2017-10-11 DIAGNOSIS — R531 Weakness: Secondary | ICD-10-CM

## 2017-10-11 DIAGNOSIS — I82411 Acute embolism and thrombosis of right femoral vein: Secondary | ICD-10-CM

## 2017-10-11 DIAGNOSIS — R9389 Abnormal findings on diagnostic imaging of other specified body structures: Secondary | ICD-10-CM

## 2017-10-11 DIAGNOSIS — Z720 Tobacco use: Secondary | ICD-10-CM

## 2017-10-11 DIAGNOSIS — D539 Nutritional anemia, unspecified: Secondary | ICD-10-CM

## 2017-10-11 DIAGNOSIS — Z8619 Personal history of other infectious and parasitic diseases: Secondary | ICD-10-CM

## 2017-10-11 DIAGNOSIS — Z6826 Body mass index (BMI) 26.0-26.9, adult: Secondary | ICD-10-CM

## 2017-10-11 NOTE — Patient Instructions (Signed)
Schedule high resolution CT scan of Chest here at [facility] Return for lab on Monday 10/14 MD visit 1 week after lab on 10/21

## 2017-10-11 NOTE — Progress Notes (Signed)
Hematology and Oncology Follow Up Visit  Paul [name] [id number] Aug 10, 1940 77 y.o. 10/11/2017 10:00 AM   Principle Diagnosis: Encounter Diagnoses  Name Primary?  Marland Kitchen Unintentional weight loss   . Polyclonal gammopathy   . DVT of deep femoral vein, right ([place])   . Chronic gout of hand, unspecified cause, unspecified laterality   . Anemia of chronic disease   . Weakness   . Abdominal aortic aneurysm (AAA) without rupture ([place])   . Current smoker   . Abnormal CXR Yes  . Macrocytic anemia      Interim History:   Short interim follow-up visit for this 77 year old man I initially saw when he was hospitalized 2 years ago in 2017 with an allergic reaction to allopurinol resulting in drug induced hepatitis and thrombocytopenia.  He was on the drug for chronic gout.  Gout currently controlled with colchicine and Uloric.  I just reevaluated him on September 27, 2017 for acute on chronic anemia, leukocytosis, thrombocytosis, and an unintentional 40 pound weight loss.  Please see office consultation for details. He had no focal physical findings.  Rectal exam normal and stool guaiac negative.  No localizing findings on review of systems.  He was recently treated for a urinary tract infection. I was concerned with possibility of occult malignancy.  He is a former heavy smoker and current light smoker 4 or 5 cigarettes daily.  CT scans done during the 2017 admission showed changes of cirrhosis putting him at risk for hepatocellular carcinoma. Recent urine analysis showed large positive blood and red cells. A repeat urine analysis at time of his visit here on 8/27 but now without protein or blood.  Repeat serum protein and immunofixation electrophoresis shows persistent elevation of IgG and IgA but polyclonal not a monoclonal pattern consistent with chronic liver disease. Chemistry profile with no significant abnormalities.  Normal liver functions except slightly decreased albumin. CBC with acute on  chronic microcytic anemia hemoglobin down from baseline 10 recorded in March of this year to 8.5.  Iron studies and ferritin normal.  ESR normal at 16 mm. Chest radiograph with no gross signs of malignancy.  Some mild pleural thickening noted at the right base. CT scan of the abdomen and pelvis with a nodular contour to the liver but no mass lesions, no splenomegaly, no pancreatic mass.  Mild generalized thickening of the bladder wall. Alpha-fetoprotein level normal.  Hepatitis C negative.  HIV screen to be done.  His blood sugars have been running low since he lost weight.  So sugars as low as 74 and recent hemoglobin A1c 5.5.  He decided to stop taking his Glucophage about 3 weeks ago.  Since that time, diarrhea which she was having about 3 times a week has resolved, his appetite has improved significantly, and he is gained back some weight 3 or 4 pounds on his scale but currently at 186 pounds little change from August 27 when he weighed 187 pounds.  He is accompanied by his lady friend today.   Medications: reviewed  Allergies:  Allergies  Allergen Reactions  . Allopurinol Other (See Comments)    thrombocytopenia    Review of Systems: No new symptoms  Physical Exam: Blood pressure (!) 118/45, pulse (!) 56, temperature 97.6 F (36.4 C), temperature source Oral, height _0  (1.778 m), weight 186 lb 1.6 oz (84.4 kg), SpO2 99 %. Wt Readings from Last 3 Encounters:  10/11/17 186 lb 1.6 oz (84.4 kg)  10/04/17 183 lb 3.2 oz (83.1 kg)  09/27/17 187 lb 3.2 oz (84.9 kg)   Complete physical exam done on August 27.  Not repeated today.  Lab Results: CBC W/Diff    Component Value Date/Time   WBC 9.6 09/27/2017 1030   WBC 10.9 (H) 08/30/2016 1406   RBC 2.49 (LL) 09/27/2017 1030   RBC 3.21 (L) 08/30/2016 1406   HGB 8.3 (L) 09/27/2017 1030   HCT 24.5 (L) 09/27/2017 1030   PLT 343 09/27/2017 1030   MCV 98 (H) 09/27/2017 1030   MCH 33.3 (H) 09/27/2017 1030   MCH 31.2 08/30/2016 1406    MCHC 33.9 09/27/2017 1030   MCHC 33.3 08/30/2016 1406   RDW 29.4 (H) 09/27/2017 1030   LYMPHSABS 2.1 09/27/2017 1030   MONOABS 545 08/30/2016 1406   EOSABS 0.3 09/27/2017 1030   BASOSABS 0.1 09/27/2017 1030     Chemistry      Component Value Date/Time   NA 138 09/19/2017 1037   K 4.3 09/19/2017 1037   CL 104 09/19/2017 1037   CO2 20 09/19/2017 1037   BUN 23 09/19/2017 1037   CREATININE 1.25 09/19/2017 1037   CREATININE 0.96 08/30/2016 1406   GLU 123 04/25/2015      Component Value Date/Time   CALCIUM 9.1 09/19/2017 1037   ALKPHOS 106 09/19/2017 1037   AST 29 09/19/2017 1037   ALT 23 09/19/2017 1037   BILITOT 0.7 09/19/2017 1037       Radiological Studies: Dg Chest 2 View  Result Date: 09/28/2017 CLINICAL DATA:  Recent weight loss and productive cough, initial encounter EXAM: CHEST - 2 VIEW COMPARISON:  12/04/2015 FINDINGS: Cardiac shadow is within normal limits. The lungs are hyperinflated consistent with COPD. Focal calcification is noted in the posterior aspect of the right lower lobe stable from the prior exam. No focal confluent infiltrate is seen. Mild apical density is noted likely related to scarring but new from the prior exam. Pleural thickening is noted on the right new from the prior exam suspicious for small effusion. IMPRESSION: COPD. New right-sided effusion and chronic changes. Given the patient's clinical history CT of the chest may be helpful to rule out underlying occult malignancy. Electronically Signed   By: Inez Catalina M.D.   On: 09/28/2017 09:47   Ct Abdomen Pelvis W Contrast  Result Date: 10/07/2017 CLINICAL DATA:  Unintentional 40 lb weight loss in past 4 months. Non localized abdominal pain. Cirrhosis. EXAM: CT ABDOMEN AND PELVIS WITH CONTRAST TECHNIQUE: Multidetector CT imaging of the abdomen and pelvis was performed using the standard protocol following bolus administration of intravenous contrast. CONTRAST:  146m ISOVUE-300 IOPAMIDOL (ISOVUE-300)  INJECTION 61% COMPARISON:  None. FINDINGS: Lower Chest: Small right pleural effusion versus pleural thickening. Hepatobiliary: No hepatic masses identified. Enlarged caudate lobe and mild capsular nodularity are suspicious for cirrhosis. Gallbladder is unremarkable. Pancreas:  No mass or inflammatory changes. Spleen: Within normal limits in size and appearance. Adrenals/Urinary Tract: No masses identified. No evidence of hydronephrosis. Mild diffuse bladder wall thickening, which is suspicious for cystitis in absence of enlarged prostate. Stomach/Bowel: No evidence of obstruction, inflammatory process or abnormal fluid collections. Vascular/Lymphatic: No pathologically enlarged lymph nodes. 4.0 cm infrarenal abdominal aortic aneurysm. Reproductive: No mass or other significant abnormality. Penile prosthesis noted. Other:  None. Musculoskeletal:  No suspicious bone lesions identified. IMPRESSION: No radiographic evidence of malignancy. Mild diffuse bladder wall thickening, suspicious for cystitis. Suggest correlation with urinalysis, and consider cystoscopy for further evaluation. Probable hepatic cirrhosis. 4.0 cm infrarenal abdominal aortic aneurysm. Recommend followup by ultrasound  in 1 year. This recommendation follows ACR consensus guidelines: White Paper of the ACR Incidental Findings Committee II on Vascular Findings. J Am Coll Radiol 2013; 10:789-794. Electronically Signed   By: Earle Gell M.D.   On: 10/07/2017 12:59    Impression:  Unintentional weight loss and acute on chronic macrocytic anemia.  No gross evidence for occult malignancy on studies done so far. I was unimpressed with his chest x-ray findings but per radiology  suggestion I will go ahead and get a high-resolution noncontrast CT scan of the chest given his increased risk for lung cancer. We are left with a borderline acute on chronic macrocytic anemia with a negative myeloma screen.  No evidence for a hemolytic process.  Borderline  renal function.  CKD 2.  Since he is feeling better and starting to gain weight I would like to give him another period of observation before considering doing a bone marrow biopsy. I am going to have him come back in 1 month.  Repeat labs and do an HIV screen at that time and then decide whether or not we need to do any further diagnostic testing.  100% of this visit spent with direct face-to-face contact with the patient and his significant other to review lab and x-ray data and discussed management.   CC: Patient Care Team: Denita Lung, MD as PCP - General (Family Medicine)   Murriel Hopper, MD, [place]  Hematology-Oncology/Internal Medicine     9/10/201910:00 AM

## 2017-11-03 ENCOUNTER — Ambulatory Visit (HOSPITAL_COMMUNITY)
Admission: RE | Admit: 2017-11-03 | Discharge: 2017-11-03 | Disposition: A | Discharge status: Home / Self Care | Payer: Medicare Other | Source: Ambulatory Visit | Attending: Oncology | Admitting: Oncology

## 2017-11-03 DIAGNOSIS — I700 Atherosclerosis of aorta: Secondary | ICD-10-CM | POA: Diagnosis not present

## 2017-11-03 DIAGNOSIS — R918 Other nonspecific abnormal finding of lung field: Secondary | ICD-10-CM | POA: Insufficient documentation

## 2017-11-03 DIAGNOSIS — I2510 Atherosclerotic heart disease of native coronary artery without angina pectoris: Secondary | ICD-10-CM | POA: Diagnosis not present

## 2017-11-03 DIAGNOSIS — F17200 Nicotine dependence, unspecified, uncomplicated: Secondary | ICD-10-CM | POA: Diagnosis not present

## 2017-11-03 DIAGNOSIS — J8410 Pulmonary fibrosis, unspecified: Secondary | ICD-10-CM | POA: Insufficient documentation

## 2017-11-03 DIAGNOSIS — I712 Thoracic aortic aneurysm, without rupture: Secondary | ICD-10-CM | POA: Insufficient documentation

## 2017-11-03 DIAGNOSIS — R9389 Abnormal findings on diagnostic imaging of other specified body structures: Secondary | ICD-10-CM | POA: Diagnosis not present

## 2017-11-03 DIAGNOSIS — J439 Emphysema, unspecified: Secondary | ICD-10-CM | POA: Diagnosis not present

## 2017-11-03 DIAGNOSIS — R634 Abnormal weight loss: Secondary | ICD-10-CM

## 2017-11-03 DIAGNOSIS — J438 Other emphysema: Secondary | ICD-10-CM | POA: Diagnosis not present

## 2017-11-04 ENCOUNTER — Telehealth: Payer: Self-pay | Admitting: *Deleted

## 2017-11-04 NOTE — Telephone Encounter (Signed)
-----   Message from Annia Belt, MD sent at 11/04/2017  9:54 AM EDT ----- Call pt: no sign of cancer on CT chest; some scarring in the lungs consistent with emphysema; widening of the aorta which should be followed once a year. Calcium deposits seen in the coronary blood vessels due to atherosclerosis. I will forward copy of report to Dr. Redmond School.

## 2017-11-04 NOTE — Telephone Encounter (Signed)
Pt called/ informed "no sign of cancer on CT chest; some scarring in the lungs consistent with emphysema; widening of the aorta which should be followed once a year. Calcium deposits seen in the coronary blood vessels due to atherosclerosis. I will forward copy of report to Dr. Redmond School." per Dr Beryle Beams.  Stated "this sound darn good" and thanks for calling.

## 2017-11-10 NOTE — Progress Notes (Signed)
Chief Complaint  Patient presents with  . Follow-up    CAD     History of Present Illness: 77 yo male with history of CAD, DM, bilateral carotid artery disease, HTN, HLD and DVT here today for cardiology follow up. His echo in 2009 showed mild LVH and normal LV function with no significant valvular abnormalities. His cath iin 2009 showed a near total occlusion of the RCA beyond the stent in the mid portion. I attempted PCI and was able to balloon the mid lesion but could never pass a balloon distally. He did well after that.  Cardiac cath in December 2013 in the setting of worsened chest pain/dsyspnea showed total occlusion of the RCA which was not amenable to PCI (PCI attempted in 2009). The LAD and Circumflex had moderate disease. Normal ABI 10/01/13. Carotid artery dopplers July 2018 with stable moderate bilateral ICA stenosis. He was found to have a DVT in November 2017 and was treated with Xarelto. He is now off of Xarelto and back on ASA and Plavix. Echo November 2017 with normal LV systolic function, moderate AI. He has been followed by [name] in the Heme/Onc clinic for unintentional weight loss and chronic macrocytic anemia. Malignancy workup negative.   He is here today for follow up. The patient denies any chest pain, dyspnea, palpitations, lower extremity edema, orthopnea, PND, dizziness, near syncope or syncope.   Primary Care Physician: Denita Lung, MD   Past Medical History:  Diagnosis Date  . AAA (abdominal aortic aneurysm) ([place])   . Abnormal LFTs   . AKI (acute kidney injury) ([place])   . Anemia of chronic disease   . Arthritis    "all over" (12/04/2015)  . Atherosclerosis of native coronary artery of native heart without angina pectoris   . CAD (coronary artery disease)    with cath 09/26/08 showing severe heart disease in RCA with POBA, mild to mod diesease in circ and LAD  . Chronic back pain    back problems prior to surgery   . Constipation   . Coronary  atherosclerosis of native coronary artery   . Dehydration   . Diabetes mellitus    with neuropathy  . Diarrhea   . Elevated liver enzymes   . Generalized weakness   . Gout of right hand   . Hiatal hernia   . History of gastroesophageal reflux (GERD)   . History of stomach ulcers   . Hyperlipidemia   . Hypertension   . Metabolic acidosis   . Normochromic anemia 02/23/2016  . 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 Thrombocytopenia ([place])   . Unintentional weight loss 09/27/2017   33 lbs between 4/19 & 8/19  . Vitamin B12 deficiency   . Vitamin B12 deficiency   . Vitamin D deficiency   . Weakness     Past Surgical History:  Procedure Laterality Date  . ANKLE FRACTURE SURGERY Left    "crushed"  . BACK SURGERY  X 3   "fell 4 stories in 1977"  . CARPAL TUNNEL RELEASE Left   . CORONARY ANGIOPLASTY WITH STENT PLACEMENT  09/26/2008   [name] 06/03/2010  . FRACTURE SURGERY    . LEFT HEART CATHETERIZATION WITH CORONARY ANGIOGRAM N/A 01/21/2012   Procedure: LEFT HEART CATHETERIZATION WITH CORONARY ANGIOGRAM;  Surgeon: Burnell Blanks, MD;  Location: [facility] CATH LAB;  Service: Cardiovascular;  Laterality: N/A;  . LUMBAR [place]; ~ 1980  . POSTERIOR LUMBAR FUSION  ~ 1998   "3rd OR to reconstruct  my back after fall in 1977"  . 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 ACCU-CHEK AVIVA PLUS test strip Test once to twice daily 200 each 4  . aspirin 81 MG chewable tablet Chew 81 mg by mouth daily.    . clopidogrel (PLAVIX) 75 MG tablet Take 1 tablet (75 mg total) by mouth daily. 90 tablet 3  . colchicine 0.6 MG tablet TAKE 1 TABLET BY MOUTH  DAILY (Patient taking differently: 0.6 mg 2 (two) times daily. ) 90 tablet 1  . febuxostat (ULORIC) 40 MG tablet Take 1 tablet (40 mg total) by mouth daily. 90 tablet 3  . ferrous sulfate 325 (65 FE) MG tablet Take 325 mg by mouth daily with breakfast.    . fish oil-omega-3 fatty  acids 1000 MG capsule Take 1 g by mouth 2 (two) times daily.     . isosorbide mononitrate (IMDUR) 30 MG 24 hr tablet TAKE 1 TABLET BY MOUTH  TWICE A DAY 180 tablet 0  . metoprolol tartrate (LOPRESSOR) 25 MG tablet Take 1 tablet (25 mg total) by mouth 2 (two) times daily. Please make yearly appt with Dr. Angelena Form for July. 1st attempt 180 tablet 0  . Multiple Vitamins-Minerals (CENTRUM) tablet Take 1 tablet by mouth daily.    . nitroGLYCERIN (NITROSTAT) 0.4 MG SL tablet Place 0.4 mg under the tongue every 5 (five) minutes as needed for chest pain. For 3 doses    . simvastatin (ZOCOR) 20 MG tablet TAKE 1 TABLET BY MOUTH AT  BEDTIME 90 tablet 3  . vitamin B-12 (CYANOCOBALAMIN) 1000 MCG tablet Take 1 tablet (1,000 mcg total) by mouth daily. 90 tablet 3   No current facility-administered medications for this visit.     Allergies  Allergen Reactions  . Allopurinol Other (See Comments)    thrombocytopenia    Social History   Socioeconomic History  . Marital status: Married    Spouse name: Not on file  . Number of children: Not on file  . Years of education: Not on file  . Highest education level: Not on file  Occupational History  . Not on file  Social Needs  . Financial resource strain: Not on file  . Food insecurity:    Worry: Not on file    Inability: Not on file  . Transportation needs:    Medical: Not on file    Non-medical: Not on file  Tobacco Use  . Smoking status: Former Smoker    Years: 20.00    Types: Cigars    Last attempt to quit: 01/24/2013    Years since quitting: 4.8  . Smokeless tobacco: Never Used  Substance and Sexual Activity  . Alcohol use: No    Comment: 12/04/2015 "nothing in 3-4 years"  . Drug use: No  . Sexual activity: Not on file  Lifestyle  . Physical activity:    Days per week: Not on file    Minutes per session: Not on file  . Stress: Not on file  Relationships  . Social connections:    Talks on phone: Not on file    Gets together: Not on  file    Attends religious service: Not on file    Active member of club or organization: Not on file    Attends meetings of clubs or organizations: Not on file    Relationship status: Not on file  . Intimate partner violence:  Fear of current or ex partner: Not on file    Emotionally abused: Not on file    Physically abused: Not on file    Forced sexual activity: Not on file  Other Topics Concern  . Not on file  Social History Narrative   Divorced, 1 child.    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    Owned roofing co. In [place]    Family History  Problem Relation Age of Onset  . Heart attack Mother   . Cancer Mother   . Heart disease Mother   . Heart attack Sister   . Heart disease Sister     Review of Systems:  As stated in the HPI and otherwise negative.   BP (!) 110/50   Pulse 70   Ht 5\' 10"  (1.778 m)   Wt 190 lb 12.8 oz (86.5 kg)   SpO2 99%   BMI 27.38 kg/m   Physical Examination:  General: Well developed, well nourished, NAD  HEENT: OP clear, mucus membranes moist  SKIN: warm, dry. No rashes. Neuro: No focal deficits  Musculoskeletal: Muscle strength 5/5 all ext  Psychiatric: Mood and affect normal  Neck: No JVD, no carotid bruits, no thyromegaly, no lymphadenopathy.  Lungs:Clear bilaterally, no wheezes, rhonci, crackles Cardiovascular: Regular rate and rhythm. Soft systolic murmur.  Abdomen:Soft. Bowel sounds present. Non-tender.  Extremities: No lower extremity edema. Pulses are 2 + in the bilateral DP/PT.  Echo November 2017: Left ventricle: The cavity size was mildly dilated. Systolic   function was normal. The estimated ejection fraction was in the   range of 60% to 65%. Wall motion was normal; there were no   regional wall motion abnormalities. There was an increased   relative contribution of atrial contraction to ventricular   filling. Doppler parameters are consistent with abnormal left   ventricular relaxation  (grade 1 diastolic dysfunction). - Aortic valve: Trileaflet; normal thickness, mildly calcified   leaflets. There was moderate regurgitation. - Left atrium: The atrium was moderately dilated. - Atrial septum: There was increased thickness of the septum,   consistent with lipomatous hypertrophy. - Pulmonary arteries: PA peak pressure: 36 mm Hg (S). - Impressions: The right ventricular systolic pressure was   increased consistent with mild pulmonary hypertension.  Impressions:  - The right ventricular systolic pressure was increased consistent   with mild pulmonary hypertension.  EKG:  EKG is not ordered today. The ekg ordered today demonstrates   Recent Labs: 04/11/2017: TSH 1.680 09/19/2017: ALT 23; BUN 23; Creatinine, Ser 1.25; Potassium 4.3; Sodium 138 09/27/2017: Hemoglobin 8.3; Platelets 343   Lipid Panel    Component Value Date/Time   CHOL 83 (L) 09/19/2017 1037   TRIG 84 09/19/2017 1037   HDL 34 (L) 09/19/2017 1037   CHOLHDL 2.4 09/19/2017 1037   CHOLHDL 3.6 03/03/2015 0001   VLDL 27 03/03/2015 0001   LDLCALC 32 09/19/2017 1037     Wt Readings from Last 3 Encounters:  11/11/17 190 lb 12.8 oz (86.5 kg)  10/11/17 186 lb 1.6 oz (84.4 kg)  10/04/17 183 lb 3.2 oz (83.1 kg)     Other studies Reviewed: Additional studies/ records that were reviewed today include: . Review of the above records demonstrates:    Assessment and Plan:   1. CAD with chronic stable angina: He has stable chest pain, no recent change in frequency or severity. The RCA is known to be occluded and not amenable to PCI. There is moderate disease in the  LAD and Circumflex by cath in 2013. Continue ASA, Plavix, statin, beta blocker and Imdur. .    2. HTN: BP is controlled. No changes.   3. Carotid artery disease: Moderate bilateral ICA stenosis, stable by dopplers July 2018. He does not wish to repeat now.   4. Hyperlipidemia: Lipids well controlled. Continue statin  5. Aortic valve  insufficiency: Moderate by echo November 2017. Will repeat echo in 2020.  Current medicines are reviewed at length with the patient today.  The patient does not have concerns regarding medicines.  The following changes have been made:  no change  Labs/ tests ordered today include:   No orders of the defined types were placed in this encounter.   Disposition:   FU with me in 12 months  Signed, Lauree Chandler, MD 11/11/2017 3:02 PM    [facility] [address], [place], [place]  [address] Phone: [phone]; Fax: [phone]

## 2017-11-11 ENCOUNTER — Ambulatory Visit: Payer: Medicare Other | Admitting: Cardiovascular Disease

## 2017-11-11 ENCOUNTER — Encounter: Payer: Self-pay | Admitting: Cardiovascular Disease

## 2017-11-11 VITALS — BP 110/50 | HR 70 | Ht 70.0 in | Wt 190.8 lb

## 2017-11-11 DIAGNOSIS — I351 Nonrheumatic aortic (valve) insufficiency: Secondary | ICD-10-CM

## 2017-11-11 DIAGNOSIS — E785 Hyperlipidemia, unspecified: Secondary | ICD-10-CM | POA: Diagnosis not present

## 2017-11-11 DIAGNOSIS — I6523 Occlusion and stenosis of bilateral carotid arteries: Secondary | ICD-10-CM | POA: Diagnosis not present

## 2017-11-11 DIAGNOSIS — I10 Essential (primary) hypertension: Secondary | ICD-10-CM | POA: Diagnosis not present

## 2017-11-11 DIAGNOSIS — I25118 Atherosclerotic heart disease of native coronary artery with other forms of angina pectoris: Secondary | ICD-10-CM

## 2017-11-11 NOTE — Patient Instructions (Signed)
Medication Instructions:  Your physician recommends that you continue on your current medications as directed. Please refer to the Current Medication list given to you today.  If you need a refill on your cardiac medications before your next appointment, please call your pharmacy.   Lab work: none If you have labs (blood work) drawn today and your tests are completely normal, you will receive your results only by: . MyChart Message (if you have MyChart) OR . A paper copy in the mail If you have any lab test that is abnormal or we need to change your treatment, we will call you to review the results.  Testing/Procedures: none  Follow-Up: At [facility], you and your health needs are our priority.  As part of our continuing mission to provide you with exceptional heart care, we have created designated Provider Care Teams.  These Care Teams include your primary Cardiologist (physician) and Advanced Practice Providers (APPs -  Physician Assistants and Nurse Practitioners) who all work together to provide you with the care you need, when you need it. You will need a follow up appointment in 12 months.  Please call our office 2 months in advance to schedule this appointment.  You may see Dr. McAlhany or one of the following Advanced Practice Providers on your designated Care Team:   Brittainy Simmons, PA-C Dayna Dunn, PA-C . Michele Lenze, PA-C  Any Other Special Instructions Will Be Listed Below (If Applicable).    

## 2017-11-14 ENCOUNTER — Other Ambulatory Visit (INDEPENDENT_AMBULATORY_CARE_PROVIDER_SITE_OTHER): Payer: Medicare Other

## 2017-11-14 DIAGNOSIS — R634 Abnormal weight loss: Secondary | ICD-10-CM

## 2017-11-14 DIAGNOSIS — D539 Nutritional anemia, unspecified: Secondary | ICD-10-CM | POA: Diagnosis not present

## 2017-11-14 DIAGNOSIS — D638 Anemia in other chronic diseases classified elsewhere: Secondary | ICD-10-CM | POA: Diagnosis not present

## 2017-11-15 LAB — CBC WITH DIFFERENTIAL/PLATELET
Basophils Absolute: 0.2 10*3/uL (ref 0.0–0.2)
Basos: 2 %
EOS (ABSOLUTE): 0.3 10*3/uL (ref 0.0–0.4)
Eos: 3 %
Hematocrit: 26.1 % — ABNORMAL LOW (ref 37.5–51.0)
Hemoglobin: 8.8 g/dL — ABNORMAL LOW (ref 13.0–17.7)
Immature Grans (Abs): 0.2 10*3/uL — ABNORMAL HIGH (ref 0.0–0.1)
Immature Granulocytes: 2 %
Lymphocytes Absolute: 2.4 10*3/uL (ref 0.7–3.1)
Lymphs: 22 %
MCH: 33.3 pg — ABNORMAL HIGH (ref 26.6–33.0)
MCHC: 33.7 g/dL (ref 31.5–35.7)
MCV: 99 fL — ABNORMAL HIGH (ref 79–97)
Monocytes Absolute: 0.7 10*3/uL (ref 0.1–0.9)
Monocytes: 7 %
Neutrophils Absolute: 6.9 10*3/uL (ref 1.4–7.0)
Neutrophils: 64 %
Platelets: 383 10*3/uL (ref 150–450)
RBC: 2.64 x10E6/uL — CL (ref 4.14–5.80)
RDW: 23.3 % — ABNORMAL HIGH (ref 12.3–15.4)
WBC: 10.6 10*3/uL (ref 3.4–10.8)

## 2017-11-15 LAB — HIV ANTIBODY (ROUTINE TESTING W REFLEX): HIV Screen 4th Generation wRfx: NONREACTIVE

## 2017-11-15 LAB — VITAMIN B12: Vitamin B-12: 789 pg/mL (ref 232–1245)

## 2017-11-15 LAB — FOLATE: Folate: >20 ng/mL (ref >3.0)

## 2017-11-21 ENCOUNTER — Ambulatory Visit (INDEPENDENT_AMBULATORY_CARE_PROVIDER_SITE_OTHER): Payer: Medicare Other | Admitting: Oncology

## 2017-11-21 ENCOUNTER — Other Ambulatory Visit: Payer: Self-pay

## 2017-11-21 ENCOUNTER — Encounter: Payer: Self-pay | Admitting: Oncology

## 2017-11-21 VITALS — BP 141/45 | HR 62 | Temp 97.8°F | Ht 70.0 in | Wt 193.8 lb

## 2017-11-21 DIAGNOSIS — Z888 Allergy status to other drugs, medicaments and biological substances status: Secondary | ICD-10-CM

## 2017-11-21 DIAGNOSIS — D696 Thrombocytopenia, unspecified: Secondary | ICD-10-CM | POA: Diagnosis not present

## 2017-11-21 DIAGNOSIS — D72824 Basophilia: Secondary | ICD-10-CM

## 2017-11-21 DIAGNOSIS — Z87440 Personal history of urinary (tract) infections: Secondary | ICD-10-CM

## 2017-11-21 DIAGNOSIS — M109 Gout, unspecified: Secondary | ICD-10-CM

## 2017-11-21 DIAGNOSIS — D890 Polyclonal hypergammaglobulinemia: Secondary | ICD-10-CM

## 2017-11-21 DIAGNOSIS — Z6827 Body mass index (BMI) 27.0-27.9, adult: Secondary | ICD-10-CM

## 2017-11-21 DIAGNOSIS — K7460 Unspecified cirrhosis of liver: Secondary | ICD-10-CM

## 2017-11-21 DIAGNOSIS — Z79899 Other long term (current) drug therapy: Secondary | ICD-10-CM

## 2017-11-21 DIAGNOSIS — R634 Abnormal weight loss: Secondary | ICD-10-CM

## 2017-11-21 DIAGNOSIS — Z720 Tobacco use: Secondary | ICD-10-CM

## 2017-11-21 DIAGNOSIS — D649 Anemia, unspecified: Secondary | ICD-10-CM

## 2017-11-21 NOTE — Progress Notes (Signed)
Hematology and Oncology Follow Up Visit  Paul [name] [id number] March 15, 1940 77 y.o. 11/21/2017 1:26 PM   Principle Diagnosis: Encounter Diagnoses  Name Primary?  . Polyclonal gammopathy   . Normochromic anemia Yes  . Basophilia   Clinical summary: 77 year old man I initially saw when he was hospitalized 2 years ago in 2017 with an allergic reaction to allopurinol resulting in drug induced hepatitis and thrombocytopenia.  He was on the drug for chronic gout.  Gout currently controlled with colchicine and Uloric.  I just reevaluated him on September 27, 2017 for acute on chronic anemia, leukocytosis, thrombocytosis, and an unintentional 40 pound weight loss.  Please see office consultation for details. He had no focal physical findings.  Rectal exam normal and stool guaiac negative.  No localizing findings on review of systems.  He was recently treated for a urinary tract infection. I was concerned with possibility of occult malignancy.  He is a former heavy smoker and current light smoker 4 or 5 cigarettes daily.  CT scans done during the 2017 admission showed changes of cirrhosis putting him at risk for hepatocellular carcinoma. Recent urine analysis showed large positive blood and red cells. A repeat urine analysis at time of his visit here on 8/27 but now without protein or blood.  Repeat serum protein and immunofixation electrophoresis shows persistent elevation of IgG and IgA but polyclonal not a monoclonal pattern consistent with chronic liver disease. Chemistry profile with no significant abnormalities.  Normal liver functions except slightly decreased albumin. CBC with acute on chronic microcytic anemia hemoglobin down from baseline 10 recorded in March of this year to 8.5.  Iron studies and ferritin normal.  ESR normal at 16 mm. Chest radiograph with no gross signs of malignancy.  Some mild pleural thickening noted at the right base. CT scan of the abdomen and pelvis with a nodular  contour to the liver but no mass lesions, no splenomegaly, no pancreatic mass.  Mild generalized thickening of the bladder wall. Alpha-fetoprotein level normal.  Hepatitis C negative.  HIV screen to be done.  His blood sugars have been running low since he lost weight.  So sugars as low as 74 and recent hemoglobin A1c 5.5.  He decided to stop taking his Glucophage.  Since that time, diarrhea which she was having about 3 times a week has resolved, his appetite has improved significantly, and he is gained back some weight 3 or 4 pounds on his scale but currently at 186 pounds little change from August 27 when he weighed 187 pounds.  Interim History: Fairly exhaustive evaluation to exclude occult malignancy has been unrevealing.  He has a persistent anemia, leukocytosis, mild thrombocytosis.  Recent CBC done on October 14 shows 2% basophils on the white count differential raising the possibility of a underlying myeloproliferative disorder.  In addition, the pattern of a macrocytic anemia with mild thrombocytosis could reflect an underlying myelodysplastic syndrome, specifically, the 5 Q- syndrome. I discussed with the patient today that the next step in his evaluation would be to look for a bone marrow etiology of his abnormal CBC results.  He is agreeable to this and I will schedule it with [facility] long radiology.  He would like to have conscious sedation. No other interim problems since last visit.  He states that he is gaining weight and has put on about 10 pounds.  Medications: reviewed  Allergies:  Allergies  Allergen Reactions  . Allopurinol Other (See Comments)    thrombocytopenia    Review  of Systems: See interim history Remaining ROS negative:   Physical Exam: Blood pressure (!) 141/45, pulse 62, temperature 97.8 F (36.6 C), temperature source Oral, height '5\' 10"'$  (1.778 m), weight 193 lb 12.8 oz (87.9 kg), SpO2 100 %. Wt Readings from Last 3 Encounters:  11/21/17 193 lb 12.8 oz  (87.9 kg)  11/11/17 190 lb 12.8 oz (86.5 kg)  10/11/17 186 lb 1.6 oz (84.4 kg)     General appearance: No exam today.  Lab Results: CBC W/Diff    Component Value Date/Time   WBC 10.6 11/14/2017 0958   WBC 10.9 (H) 08/30/2016 1406   RBC 2.64 (LL) 11/14/2017 0958   RBC 3.21 (L) 08/30/2016 1406   HGB 8.8 (L) 11/14/2017 0958   HCT 26.1 (L) 11/14/2017 0958   PLT 383 11/14/2017 0958   MCV 99 (H) 11/14/2017 0958   MCH 33.3 (H) 11/14/2017 0958   MCH 31.2 08/30/2016 1406   MCHC 33.7 11/14/2017 0958   MCHC 33.3 08/30/2016 1406   RDW 23.3 (H) 11/14/2017 0958   LYMPHSABS 2.4 11/14/2017 0958   MONOABS 545 08/30/2016 1406   EOSABS 0.3 11/14/2017 0958   BASOSABS 0.2 11/14/2017 0958     Chemistry      Component Value Date/Time   NA 138 09/19/2017 1037   K 4.3 09/19/2017 1037   CL 104 09/19/2017 1037   CO2 20 09/19/2017 1037   BUN 23 09/19/2017 1037   CREATININE 1.25 09/19/2017 1037   CREATININE 0.96 08/30/2016 1406   GLU 123 04/25/2015      Component Value Date/Time   CALCIUM 9.1 09/19/2017 1037   ALKPHOS 106 09/19/2017 1037   AST 29 09/19/2017 1037   ALT 23 09/19/2017 1037   BILITOT 0.7 09/19/2017 1037       Radiological Studies: Ct Chest High Resolution  Result Date: 11/04/2017 CLINICAL DATA:  Abnormal chest radiograph, weight loss. EXAM: CT CHEST WITHOUT CONTRAST TECHNIQUE: Multidetector CT imaging of the chest was performed following the standard protocol without intravenous contrast. High resolution imaging of the lungs, as well as inspiratory and expiratory imaging, was performed. COMPARISON:  12/05/2015. FINDINGS: Cardiovascular: Atherosclerotic calcification of the arterial vasculature, including extensive three-vessel involvement of the coronary arteries and aortic valve. Ascending aorta measures 4.3 cm. Heart is at the upper limits of normal in size. No pericardial effusion. Mediastinum/Nodes: Calcified and noncalcified mediastinal and hilar lymph nodes. No  pathologically enlarged mediastinal or axillary lymph nodes. Hilar regions are otherwise difficult to definitively characterize without IV contrast. Esophagus is grossly unremarkable. Lungs/Pleura: Peripheral and basilar predominant subpleural reticulation and ground-glass with traction bronchiectasis/bronchiolectasis, possibly minimally progressive from 12/05/2015. Small right pleural effusion with pleural thickening and calcification, indicative of chronicity. Mild paraseptal emphysema. No definitive honeycombing. 4 mm left lower lobe nodule (series 6, image 85), unchanged and considered benign. Airway is unremarkable. Upper Abdomen: Visualized portions of the liver, adrenal glands, left kidney, spleen, pancreas, stomach and bowel are grossly unremarkable. Upper abdominal lymph nodes are not enlarged by CT size criteria. Musculoskeletal: Degenerative changes in the spine. No worrisome lytic or sclerotic lesions. IMPRESSION: 1. Pulmonary parenchymal pattern of fibrosis, as described above, may be minimally progressive from 12/05/2015. Findings are categorized as probable UIP per consensus guidelines: Diagnosis of Idiopathic Pulmonary Fibrosis: An Official ATS/ERS/JRS/ALAT Clinical Practice Guideline. [place], Iss 5, [phone], Oct 02 2016. 2. Small right fibrothorax. 3. Aortic atherosclerosis (ICD10-170.0). Extensive three-vessel coronary artery calcification. 4. Ascending Aortic aneurysm NOS (ICD10-I71.9). Recommend annual imaging followup  by CTA or MRA. This recommendation follows 2010 ACCF/AHA/AATS/ACR/ASA/SCA/SCAI/SIR/STS/SVM Guidelines for the Diagnosis and Management of Patients with Thoracic Aortic Disease. Circulation. 2010; 121: V697-X480. 5.  Emphysema (ICD10-J43.9). Electronically Signed   By: Lorin Picket M.D.   On: 11/04/2017 08:31    Impression: Elderly man with unexplained weight loss, anemia, leukocytosis, mild thrombocytosis suggesting occult malignancy with negative  recent evaluation summarized above.  I am now going to explore the possibility of an underlying bone marrow disorder.  Bone marrow aspiration and biopsy will be scheduled and then I will meet with the patient to review results.     CC: Patient Care Team: Denita Lung, MD as PCP - General (Family Medicine)   Murriel Hopper, MD, [place]  Hematology-Oncology/Internal Medicine     10/21/20191:26 PM

## 2017-11-21 NOTE — Patient Instructions (Signed)
I am scheduling you for a bone marrow biopsy: Radiology department at [facility] will give you a call with time and day Follow up with Dr Darnell Level in 4-6 weeks to review results

## 2017-12-02 ENCOUNTER — Other Ambulatory Visit: Payer: Self-pay | Admitting: Radiology

## 2017-12-02 DIAGNOSIS — N183 Chronic kidney disease, stage 3 (moderate): Secondary | ICD-10-CM | POA: Diagnosis not present

## 2017-12-02 DIAGNOSIS — I82A11 Acute embolism and thrombosis of right axillary vein: Secondary | ICD-10-CM | POA: Diagnosis not present

## 2017-12-02 DIAGNOSIS — M25562 Pain in left knee: Secondary | ICD-10-CM | POA: Diagnosis not present

## 2017-12-02 DIAGNOSIS — M109 Gout, unspecified: Secondary | ICD-10-CM | POA: Diagnosis not present

## 2017-12-02 DIAGNOSIS — D696 Thrombocytopenia, unspecified: Secondary | ICD-10-CM | POA: Diagnosis not present

## 2017-12-05 ENCOUNTER — Other Ambulatory Visit: Payer: Self-pay

## 2017-12-05 ENCOUNTER — Ambulatory Visit (HOSPITAL_COMMUNITY)
Admission: RE | Admit: 2017-12-05 | Discharge: 2017-12-05 | Disposition: A | Discharge status: Home / Self Care | Payer: Medicare Other | Source: Ambulatory Visit | Attending: Oncology | Admitting: Oncology

## 2017-12-05 ENCOUNTER — Encounter (HOSPITAL_COMMUNITY): Payer: Self-pay

## 2017-12-05 DIAGNOSIS — E559 Vitamin D deficiency, unspecified: Secondary | ICD-10-CM | POA: Diagnosis not present

## 2017-12-05 DIAGNOSIS — R531 Weakness: Secondary | ICD-10-CM | POA: Diagnosis not present

## 2017-12-05 DIAGNOSIS — G8929 Other chronic pain: Secondary | ICD-10-CM | POA: Insufficient documentation

## 2017-12-05 DIAGNOSIS — D72824 Basophilia: Secondary | ICD-10-CM | POA: Insufficient documentation

## 2017-12-05 DIAGNOSIS — D72822 Plasmacytosis: Secondary | ICD-10-CM | POA: Diagnosis not present

## 2017-12-05 DIAGNOSIS — M549 Dorsalgia, unspecified: Secondary | ICD-10-CM | POA: Insufficient documentation

## 2017-12-05 DIAGNOSIS — R7989 Other specified abnormal findings of blood chemistry: Secondary | ICD-10-CM | POA: Insufficient documentation

## 2017-12-05 DIAGNOSIS — M109 Gout, unspecified: Secondary | ICD-10-CM | POA: Insufficient documentation

## 2017-12-05 DIAGNOSIS — Z87891 Personal history of nicotine dependence: Secondary | ICD-10-CM | POA: Insufficient documentation

## 2017-12-05 DIAGNOSIS — Z79899 Other long term (current) drug therapy: Secondary | ICD-10-CM | POA: Insufficient documentation

## 2017-12-05 DIAGNOSIS — Z8719 Personal history of other diseases of the digestive system: Secondary | ICD-10-CM | POA: Diagnosis not present

## 2017-12-05 DIAGNOSIS — R197 Diarrhea, unspecified: Secondary | ICD-10-CM | POA: Diagnosis not present

## 2017-12-05 DIAGNOSIS — M1990 Unspecified osteoarthritis, unspecified site: Secondary | ICD-10-CM | POA: Diagnosis not present

## 2017-12-05 DIAGNOSIS — R634 Abnormal weight loss: Secondary | ICD-10-CM | POA: Diagnosis not present

## 2017-12-05 DIAGNOSIS — D890 Polyclonal hypergammaglobulinemia: Secondary | ICD-10-CM | POA: Diagnosis not present

## 2017-12-05 DIAGNOSIS — E119 Type 2 diabetes mellitus without complications: Secondary | ICD-10-CM | POA: Diagnosis not present

## 2017-12-05 DIAGNOSIS — D7589 Other specified diseases of blood and blood-forming organs: Secondary | ICD-10-CM | POA: Diagnosis not present

## 2017-12-05 DIAGNOSIS — Z7982 Long term (current) use of aspirin: Secondary | ICD-10-CM | POA: Diagnosis not present

## 2017-12-05 DIAGNOSIS — I2510 Atherosclerotic heart disease of native coronary artery without angina pectoris: Secondary | ICD-10-CM | POA: Diagnosis not present

## 2017-12-05 DIAGNOSIS — D649 Anemia, unspecified: Secondary | ICD-10-CM | POA: Diagnosis not present

## 2017-12-05 DIAGNOSIS — E785 Hyperlipidemia, unspecified: Secondary | ICD-10-CM | POA: Insufficient documentation

## 2017-12-05 DIAGNOSIS — I714 Abdominal aortic aneurysm, without rupture: Secondary | ICD-10-CM | POA: Insufficient documentation

## 2017-12-05 DIAGNOSIS — K219 Gastro-esophageal reflux disease without esophagitis: Secondary | ICD-10-CM | POA: Insufficient documentation

## 2017-12-05 DIAGNOSIS — I10 Essential (primary) hypertension: Secondary | ICD-10-CM | POA: Insufficient documentation

## 2017-12-05 DIAGNOSIS — R945 Abnormal results of liver function studies: Secondary | ICD-10-CM | POA: Insufficient documentation

## 2017-12-05 DIAGNOSIS — D696 Thrombocytopenia, unspecified: Secondary | ICD-10-CM | POA: Insufficient documentation

## 2017-12-05 DIAGNOSIS — Z8249 Family history of ischemic heart disease and other diseases of the circulatory system: Secondary | ICD-10-CM | POA: Insufficient documentation

## 2017-12-05 DIAGNOSIS — Z955 Presence of coronary angioplasty implant and graft: Secondary | ICD-10-CM | POA: Insufficient documentation

## 2017-12-05 DIAGNOSIS — Z7902 Long term (current) use of antithrombotics/antiplatelets: Secondary | ICD-10-CM | POA: Insufficient documentation

## 2017-12-05 DIAGNOSIS — K449 Diaphragmatic hernia without obstruction or gangrene: Secondary | ICD-10-CM | POA: Diagnosis not present

## 2017-12-05 LAB — CBC WITH DIFFERENTIAL/PLATELET
Abs Immature Granulocytes: 0.17 10*3/uL — ABNORMAL HIGH (ref 0.00–0.07)
Basophils Absolute: 0.2 10*3/uL — ABNORMAL HIGH (ref 0.0–0.1)
Basophils Relative: 2 %
Eosinophils Absolute: 0.3 10*3/uL (ref 0.0–0.5)
Eosinophils Relative: 2 %
HCT: 29.2 % — ABNORMAL LOW (ref 39.0–52.0)
Hemoglobin: 9.6 g/dL — ABNORMAL LOW (ref 13.0–17.0)
Immature Granulocytes: 1 %
Lymphocytes Relative: 29 %
Lymphs Abs: 3.8 10*3/uL (ref 0.7–4.0)
MCH: 31.8 pg (ref 26.0–34.0)
MCHC: 32.9 g/dL (ref 30.0–36.0)
MCV: 96.7 fL (ref 80.0–100.0)
Monocytes Absolute: 0.8 10*3/uL (ref 0.1–1.0)
Monocytes Relative: 6 %
Neutro Abs: 8 10*3/uL — ABNORMAL HIGH (ref 1.7–7.7)
Neutrophils Relative %: 60 %
Platelets: 382 10*3/uL (ref 150–400)
RBC: 3.02 MIL/uL — ABNORMAL LOW (ref 4.22–5.81)
RDW: 22.5 % — ABNORMAL HIGH (ref 11.5–15.5)
WBC: 13.3 10*3/uL — ABNORMAL HIGH (ref 4.0–10.5)
nRBC: 0 % (ref 0.0–0.2)

## 2017-12-05 LAB — BASIC METABOLIC PANEL
Anion gap: 7 (ref 5–15)
BUN: 26 mg/dL — ABNORMAL HIGH (ref 8–23)
CO2: 24 mmol/L (ref 22–32)
Calcium: 9 mg/dL (ref 8.9–10.3)
Chloride: 106 mmol/L (ref 98–111)
Creatinine, Ser: 1.24 mg/dL (ref 0.61–1.24)
GFR calc Af Amer: >60 mL/min (ref >60)
GFR calc non Af Amer: 55 mL/min — ABNORMAL LOW (ref >60)
Glucose, Bld: 103 mg/dL — ABNORMAL HIGH (ref 70–99)
Potassium: 4.1 mmol/L (ref 3.5–5.1)
Sodium: 137 mmol/L (ref 135–145)

## 2017-12-05 LAB — PROTIME-INR
INR: 0.95
Prothrombin Time: 12.6 seconds (ref 11.4–15.2)

## 2017-12-05 LAB — GLUCOSE, CAPILLARY: Glucose-Capillary: 97 mg/dL (ref 70–99)

## 2017-12-05 MED ORDER — NALOXONE HCL 0.4 MG/ML IJ SOLN
INTRAMUSCULAR | Status: AC
  Filled 2017-12-05: qty 1

## 2017-12-05 MED ORDER — FLUMAZENIL 0.5 MG/5ML IV SOLN
INTRAVENOUS | Status: AC
  Filled 2017-12-05: qty 5

## 2017-12-05 MED ORDER — FENTANYL CITRATE (PF) 100 MCG/2ML IJ SOLN
INTRAMUSCULAR | Status: AC
  Filled 2017-12-05: qty 4

## 2017-12-05 MED ORDER — FENTANYL CITRATE (PF) 100 MCG/2ML IJ SOLN
INTRAMUSCULAR | Status: DC | PRN
  Administered 2017-12-05 (×2): 25 ug via INTRAVENOUS

## 2017-12-05 MED ORDER — SODIUM CHLORIDE 0.9 % IV SOLN
INTRAVENOUS | Status: DC
  Administered 2017-12-05: 09:00:00 via INTRAVENOUS

## 2017-12-05 MED ORDER — MIDAZOLAM HCL 2 MG/2ML IJ SOLN
INTRAMUSCULAR | Status: AC
  Filled 2017-12-05: qty 4

## 2017-12-05 MED ORDER — MIDAZOLAM HCL 2 MG/2ML IJ SOLN
INTRAMUSCULAR | Status: DC | PRN
  Administered 2017-12-05 (×2): 0.5 mg via INTRAVENOUS

## 2017-12-05 NOTE — H&P (Signed)
Chief Complaint: Patient was seen in consultation today for bone marrow biopsy  Referring Physician(s): Hood M  Supervising Physician: Jacqulynn Cadet  Patient Status: [facility] - Out-pt  History of Present Illness: Paul [name] is a 77 y.o. male with a past medical history of AAA, CAD with stent placement, HLD, HTN, GERD, anemia and thrombocytopenia who presents today for bone marrow biopsy to evaluate unexplained anemia at the request of [name].  Patient was seen by [name] on 11/21/17 for follow up of acute on chronic anemia, leukocytosis, thrombocytosis and unintentional 40 lb weight loss. He had previously undergone CT chest and CT abdomen/pelvis about a month prior without any obvious malignancy. There is concern for occult malignancy and as such a bone marrow biopsy was requested for further evaluation.  Patient denies any complaints today, he states he understands the procedure and would like to proceed.  Past Medical History:  Diagnosis Date  . AAA (abdominal aortic aneurysm) ([place])   . Abnormal LFTs   . AKI (acute kidney injury) ([place])   . Anemia of chronic disease   . Arthritis    "all over" (12/04/2015)  . Atherosclerosis of native coronary artery of native heart without angina pectoris   . CAD (coronary artery disease)    with cath 09/26/08 showing severe heart disease in RCA with POBA, mild to mod diesease in circ and LAD  . Chronic back pain    back problems prior to surgery   . Constipation   . Coronary atherosclerosis of native coronary artery   . Dehydration   . Diabetes mellitus    with neuropathy  . Diarrhea   . Elevated liver enzymes   . Generalized weakness   . Gout of right hand   . Hiatal hernia   . History of gastroesophageal reflux (GERD)   . History of stomach ulcers   . Hyperlipidemia   . Hypertension   . Metabolic acidosis   . Normochromic anemia 02/23/2016  . 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  Thrombocytopenia ([place])   . Unintentional weight loss 09/27/2017   33 lbs between 4/19 & 8/19  . Vitamin B12 deficiency   . Vitamin B12 deficiency   . Vitamin D deficiency   . Weakness     Past Surgical History:  Procedure Laterality Date  . ANKLE FRACTURE SURGERY Left    "crushed"  . BACK SURGERY  X 3   "fell 4 stories in 1977"  . CARPAL TUNNEL RELEASE Left   . CORONARY ANGIOPLASTY WITH STENT PLACEMENT  09/26/2008   [name] 06/03/2010  . FRACTURE SURGERY    . LEFT HEART CATHETERIZATION WITH CORONARY ANGIOGRAM N/A 01/21/2012   Procedure: LEFT HEART CATHETERIZATION WITH CORONARY ANGIOGRAM;  Surgeon: Burnell Blanks, MD;  Location: [facility] CATH LAB;  Service: Cardiovascular;  Laterality: N/A;  . LUMBAR [place]; ~ 1980  . POSTERIOR LUMBAR FUSION  ~ 1998   "3rd OR to reconstruct my back after fall in 1977"  . 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 Authorizing Provider  aspirin 81 MG chewable tablet Chew 81 mg by mouth daily.   Yes [provider]  clopidogrel (PLAVIX) 75 MG tablet Take 1 tablet (75 mg total) by mouth daily. 05/17/17  Yes Denita Lung, MD  colchicine 0.6 MG tablet TAKE 1 TABLET BY MOUTH  DAILY Patient taking differently: 0.6  mg 2 (two) times daily.  05/30/17  Yes Denita Lung, MD  febuxostat (ULORIC) 40 MG tablet Take 1 tablet (40 mg total) by mouth daily. 05/17/17  Yes Denita Lung, MD  ferrous sulfate 325 (65 FE) MG tablet Take 325 mg by mouth daily with breakfast.   Yes [provider]  fish oil-omega-3 fatty acids 1000 MG capsule Take 1 g by mouth 2 (two) times daily.    Yes [provider]  isosorbide mononitrate (IMDUR) 30 MG 24 hr tablet TAKE 1 TABLET BY MOUTH  TWICE A DAY 10/10/17  Yes Burnell Blanks, MD  metoprolol tartrate (LOPRESSOR) 25 MG tablet Take 1 tablet (25 mg total) by mouth 2 (two)  times daily. Please make yearly appt with Dr. Angelena Form for July. 1st attempt 07/11/17  Yes Burnell Blanks, MD  Multiple Vitamins-Minerals (CENTRUM) tablet Take 1 tablet by mouth daily.   Yes [provider]  simvastatin (ZOCOR) 20 MG tablet TAKE 1 TABLET BY MOUTH AT  BEDTIME 03/28/17  Yes Burnell Blanks, MD  vitamin B-12 (CYANOCOBALAMIN) 1000 MCG tablet Take 1 tablet (1,000 mcg total) by mouth daily. 05/09/15  Yes Denita Lung, MD  ACCU-CHEK AVIVA PLUS test strip Test once to twice daily 06/20/15   Denita Lung, MD  nitroGLYCERIN (NITROSTAT) 0.4 MG SL tablet Place 0.4 mg under the tongue every 5 (five) minutes as needed for chest pain. For 3 doses 10/12/11   Denita Lung, MD     Family History  Problem Relation Age of Onset  . Heart attack Mother   . Cancer Mother   . Heart disease Mother   . Heart attack Sister   . Heart disease Sister     Social History   Socioeconomic History  . Marital status: Married    Spouse name: Not on file  . Number of children: Not on file  . Years of education: Not on file  . Highest education level: Not on file  Occupational History  . Not on file  Social Needs  . Financial resource strain: Not on file  . Food insecurity:    Worry: Not on file    Inability: Not on file  . Transportation needs:    Medical: Not on file    Non-medical: Not on file  Tobacco Use  . Smoking status: Former Smoker    Years: 20.00    Types: Cigars    Last attempt to quit: 01/24/2013    Years since quitting: 4.8  . Smokeless tobacco: Never Used  Substance and Sexual Activity  . Alcohol use: No    Comment: "Moonshine" lately.  . Drug use: No  . Sexual activity: Not on file  Lifestyle  . Physical activity:    Days per week: Not on file    Minutes per session: Not on file  . Stress: Not on file  Relationships  . Social connections:    Talks on phone: Not on file    Gets together: Not on file    Attends religious service: Not on  file    Active member of club or organization: Not on file    Attends meetings of clubs or organizations: Not on file    Relationship status: Not on file  Other Topics Concern  . Not on file  Social History Narrative   Divorced, 1 child.    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    [place]  Review of Systems: A 12 point ROS discussed and pertinent positives are indicated in the HPI above.  All other systems are negative.  Review of Systems  Constitutional: Negative for activity change, appetite change, chills, fatigue and fever.  Respiratory: Positive for cough (occasional; dry). Negative for shortness of breath.   Cardiovascular: Negative for chest pain.  Gastrointestinal: Negative for abdominal distention, abdominal pain, diarrhea, nausea and vomiting.  Skin: Negative for rash.  Neurological: Negative for dizziness and syncope.  Psychiatric/Behavioral: Negative for confusion.    Vital Signs: BP (!) 137/52 (BP Location: Right Arm)   Pulse (!) 59   Temp 98 F (36.7 C) (Oral)   Resp 16   SpO2 98%   Physical Exam  Constitutional: He is oriented to person, place, and time. No distress.  HENT:  Head: Normocephalic.  Cardiovascular: Normal rate, regular rhythm and normal heart sounds.  Pulmonary/Chest: Effort normal and breath sounds normal.  Abdominal: Soft. He exhibits no distension. There is no tenderness.  Neurological: He is alert and oriented to person, place, and time.  Skin: Skin is warm and dry. He is not diaphoretic.  Psychiatric: He has a normal mood and affect. His behavior is normal. Judgment and thought content normal.  Vitals reviewed.    MD Evaluation Airway: WNL(top and bottom dentures) Heart: WNL Abdomen: WNL Chest/ Lungs: WNL ASA  Classification: 3 Mallampati/Airway Score: One   Imaging: No results found.  Labs:  CBC: Recent Labs    09/19/17 1037 09/27/17 1030 11/14/17 0958  12/05/17 0913  WBC 14.3* 9.6 10.6 13.3*  HGB 8.5* 8.3* 8.8* 9.6*  HCT 25.0* 24.5* 26.1* 29.2*  PLT 397 343 383 382    COAGS: No results for input(s): INR, APTT in the last 8760 hours.  BMP: Recent Labs    04/11/17 1659 09/19/17 1037  NA 134 138  K 4.8 4.3  CL 97 104  CO2 17* 20  GLUCOSE 136* 116*  BUN 19 23  CALCIUM 8.9 9.1  CREATININE 1.02 1.25  GFRNONAA 72 56*  GFRAA 83 65    LIVER FUNCTION TESTS: Recent Labs    04/11/17 1659 09/19/17 1037 09/27/17 1525  BILITOT 0.7 0.7  --   AST 70* 29  --   ALT 63* 23  --   ALKPHOS 118* 106  --   PROT 7.1 7.0 7.3  ALBUMIN 3.2* 3.2*  --     TUMOR MARKERS: No results for input(s): AFPTM, CEA, CA199, CHROMGRNA in the last 8760 hours.  Assessment and Plan:  Patient with unexplained acute on chronic anemia, leukocytosis, thrombocytopenia and unintentional weight loss followed by [name]. Request has been made for bone marrow biopsy to evaluate above and r/o myelodysplastic syndrome.  Patient has been NPO since 8 pm last night aside from a few sips of water early this morning, he does Plavix with last dose yesterday morning, afebrile, WBC 13.3, H/H 9.6/29.2, INR pending.  Risks and benefits discussed with the patient including, but not limited to bleeding, infection, damage to adjacent structures or low yield requiring additional tests.  All of the patient's questions were answered, patient is agreeable to proceed.  Consent signed and in chart.  Thank you for this interesting consult.  I greatly enjoyed meeting Paul [name] and look forward to participating in their care.  A copy of this report was sent to the requesting provider on this date.  Electronically Signed: Joaquim Nam, PA-C 12/05/2017, 10:05 AM   I spent a total of  30  Minutes in face to face in clinical consultation, greater than 50% of which was counseling/coordinating care for bone marrow biopsy.

## 2017-12-05 NOTE — Discharge Instructions (Signed)
Bone Marrow Aspiration and Bone Marrow Biopsy, Adult, Care After °This sheet gives you information about how to care for yourself after your procedure. Your health care provider may also give you more specific instructions. If you have problems or questions, contact your health care provider. °What can I expect after the procedure? °After the procedure, it is common to have: °· Mild pain and tenderness. °· Swelling. °· Bruising. ° °Follow these instructions at home: °· Take over-the-counter or prescription medicines only as told by your health care provider. °· Do not take baths, swim, or use a hot tub until your health care provider approves. Ask if you can take a shower or have a sponge bath. °· Follow instructions from your health care provider about how to take care of the puncture site. Make sure you: °? Wash your hands with soap and water before you change your bandage (dressing). If soap and water are not available, use hand sanitizer. °? Change your dressing as told by your health care provider. °· Check your puncture site every day for signs of infection. Check for: °? More redness, swelling, or pain. °? More fluid or blood. °? Warmth. °? Pus or a bad smell. °· Return to your normal activities as told by your health care provider. Ask your health care provider what activities are safe for you. °· Do not drive for 24 hours if you were given a medicine to help you relax (sedative). °· Keep all follow-up visits as told by your health care provider. This is important. °Contact a health care provider if: °· You have more redness, swelling, or pain around the puncture site. °· You have more fluid or blood coming from the puncture site. °· Your puncture site feels warm to the touch. °· You have pus or a bad smell coming from the puncture site. °· You have a fever. °· Your pain is not controlled with medicine. °This information is not intended to replace advice given to you by your health care provider. Make sure  you discuss any questions you have with your health care provider. °Document Released: 08/07/2004 Document Revised: 08/08/2015 Document Reviewed: 07/02/2015 °Elsevier Interactive Patient Education © 2018 Elsevier Inc. °Moderate Conscious Sedation, Adult, Care After °These instructions provide you with information about caring for yourself after your procedure. Your health care provider may also give you more specific instructions. Your treatment has been planned according to current medical practices, but problems sometimes occur. Call your health care provider if you have any problems or questions after your procedure. °What can I expect after the procedure? °After your procedure, it is common: °· To feel sleepy for several hours. °· To feel clumsy and have poor balance for several hours. °· To have poor judgment for several hours. °· To vomit if you eat too soon. ° °Follow these instructions at home: °For at least 24 hours after the procedure: ° °· Do not: °? Participate in activities where you could fall or become injured. °? Drive. °? Use heavy machinery. °? Drink alcohol. °? Take sleeping pills or medicines that cause drowsiness. °? Make important decisions or sign legal documents. °? Take care of children on your own. °· Rest. °Eating and drinking °· Follow the diet recommended by your health care provider. °· If you vomit: °? Drink water, juice, or soup when you can drink without vomiting. °? Make sure you have little or no nausea before eating solid foods. °General instructions °· Have a responsible adult stay with you until you are   awake and alert.  Take over-the-counter and prescription medicines only as told by your health care provider.  If you smoke, do not smoke without supervision.  Keep all follow-up visits as told by your health care provider. This is important. Contact a health care provider if:  You keep feeling nauseous or you keep vomiting.  You feel light-headed.  You develop a  rash.  You have a fever. Get help right away if:  You have trouble breathing. This information is not intended to replace advice given to you by your health care provider. Make sure you discuss any questions you have with your health care provider. Document Released: 11/08/2012 Document Revised: 06/23/2015 Document Reviewed: 05/10/2015 Elsevier Interactive Patient Education  [employer].

## 2017-12-05 NOTE — Procedures (Signed)
Interventional Radiology Procedure Note  Procedure: CT guided aspirate and core biopsy of right iliac bone Complications: None Recommendations: - Bedrest supine x 1 hrs - Hydrocodone PRN  Pain - Follow biopsy results  Signed,  Darcee Dekker K. Donyell Ding, MD   

## 2017-12-14 ENCOUNTER — Ambulatory Visit (HOSPITAL_COMMUNITY)
Admission: RE | Admit: 2017-12-14 | Discharge: 2017-12-14 | Disposition: A | Discharge status: Home / Self Care | Payer: Medicare Other | Source: Ambulatory Visit | Attending: Vascular Surgery | Admitting: Vascular Surgery

## 2017-12-14 ENCOUNTER — Encounter (HOSPITAL_COMMUNITY): Payer: Self-pay | Admitting: Oncology

## 2017-12-14 ENCOUNTER — Other Ambulatory Visit: Payer: Self-pay | Admitting: Oncology

## 2017-12-14 ENCOUNTER — Encounter: Payer: Self-pay | Admitting: Vascular Surgery

## 2017-12-14 ENCOUNTER — Ambulatory Visit (INDEPENDENT_AMBULATORY_CARE_PROVIDER_SITE_OTHER): Payer: Medicare Other | Admitting: Vascular Surgery

## 2017-12-14 ENCOUNTER — Encounter: Payer: Self-pay | Admitting: Oncology

## 2017-12-14 VITALS — BP 143/58 | HR 65 | Resp 18 | Ht 70.0 in | Wt 191.4 lb

## 2017-12-14 DIAGNOSIS — I714 Abdominal aortic aneurysm, without rupture, unspecified: Secondary | ICD-10-CM

## 2017-12-14 DIAGNOSIS — D4620 Refractory anemia with excess of blasts, unspecified: Secondary | ICD-10-CM

## 2017-12-14 NOTE — Progress Notes (Signed)
I called the patient to give him the final results of the bone marrow aspiration and biopsy done on November 4. 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  Dyserythropoiesis.  Rare ringed sideroblasts.  Hypo-lobation in some of the megakaryocytes.  No excess blasts.  (3%).  No excess plasma cells.  Scattered atypical lymphoid aggregates.  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  No deletions in chromosomes 5 or 7. We can monitor his counts at present.  This gives [place] a reason for his persistent anemia above and beyond his other medical comorbidities.  If hemoglobin is consistently less than 10 or continues to fall, he would be a candidate for an erythropoietin stimulating agent such as Aranesp. Check erythropoietin baseline level at time of his next visit.

## 2017-12-14 NOTE — Progress Notes (Signed)
Patient name: Paul [name] MRN: [id number] DOB: January 07, 1941 Sex: male  REASON FOR VISIT:   Follow-up of abdominal aortic aneurysm  HPI:   Paul [name] is a pleasant 77 y.o. male who I saw in consultation on 06/01/2017 with a 4.6 cm infrarenal abdominal aortic aneurysm.  He was asymptomatic.  He comes in for a six-month follow-up visit.  Since I saw him last, he had lost 40 pounds and was being worked up for this.  A CT scan was ordered and his metformin was held prior to his CT with contrast.  After his metformin was stopped he began feeling better and is started gaining weight.  It appears that his weight loss may have potentially been related to this.  His blood sugar has been under good control.  He denies any abdominal pain or back pain.  Of note his CT scan in September showed that the aneurysm was 4.0 cm in maximum diameter.  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  He denies any claudication, rest pain, or nonhealing ulcers.  He does have a history of carotid disease and is followed by Dr. Darlina Guys.  He denies any history of stroke, TIAs, expressive or receptive aphasia, or amaurosis fugax.  Past Medical History:  Diagnosis Date  . AAA (abdominal aortic aneurysm) ([place])   . Abnormal LFTs   . AKI (acute kidney injury) ([place])   . Anemia of chronic disease   . Arthritis    "all over" (12/04/2015)  . Atherosclerosis of native coronary artery of native heart without angina pectoris   . CAD (coronary artery disease)    with cath 09/26/08 showing severe heart disease in RCA with POBA, mild to mod diesease in circ and LAD  . Chronic back pain    back problems prior to surgery   . Constipation   . Coronary atherosclerosis of native coronary artery   . Dehydration   . Diabetes mellitus    with neuropathy  . Diarrhea   . Elevated liver enzymes   . Generalized weakness   . Gout of right hand   . Hiatal hernia   . History of gastroesophageal reflux  (GERD)   . History of stomach ulcers   . Hyperlipidemia   . Hypertension   . Metabolic acidosis   . Normochromic anemia 02/23/2016  . 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 Thrombocytopenia ([place])   . Unintentional weight loss 09/27/2017   33 lbs between 4/19 & 8/19  . Vitamin B12 deficiency   . Vitamin B12 deficiency   . Vitamin D deficiency   . Weakness     Family History  Problem Relation Age of Onset  . Heart attack Mother   . Cancer Mother   . Heart disease Mother   . Heart attack Sister   . Heart disease Sister     SOCIAL HISTORY: Social History   Tobacco Use  . Smoking status: Former Smoker    Years: 20.00    Types: Cigars    Last attempt to quit: 01/24/2013    Years since quitting: 4.8  . Smokeless tobacco: Never Used  Substance Use Topics  . Alcohol use: No    Comment: "Moonshine" lately.    Allergies  Allergen Reactions  . Allopurinol Other (See Comments)    thrombocytopenia    Current Outpatient Medications  Medication Sig Dispense Refill  . ACCU-CHEK AVIVA PLUS test strip Test once to twice daily 200 each 4  . aspirin 81 MG chewable  tablet Chew 81 mg by mouth daily.    . clopidogrel (PLAVIX) 75 MG tablet Take 1 tablet (75 mg total) by mouth daily. 90 tablet 3  . colchicine 0.6 MG tablet TAKE 1 TABLET BY MOUTH  DAILY (Patient taking differently: 0.6 mg 2 (two) times daily. ) 90 tablet 1  . febuxostat (ULORIC) 40 MG tablet Take 1 tablet (40 mg total) by mouth daily. 90 tablet 3  . ferrous sulfate 325 (65 FE) MG tablet Take 325 mg by mouth daily with breakfast.    . fish oil-omega-3 fatty acids 1000 MG capsule Take 1 g by mouth 2 (two) times daily.     . isosorbide mononitrate (IMDUR) 30 MG 24 hr tablet TAKE 1 TABLET BY MOUTH  TWICE A DAY 180 tablet 0  . metoprolol tartrate (LOPRESSOR) 25 MG tablet Take 1 tablet (25 mg total) by mouth 2 (two) times daily. Please make yearly appt with Dr. Angelena Form for July. 1st attempt 180 tablet 0  . Multiple  Vitamins-Minerals (CENTRUM) tablet Take 1 tablet by mouth daily.    . nitroGLYCERIN (NITROSTAT) 0.4 MG SL tablet Place 0.4 mg under the tongue every 5 (five) minutes as needed for chest pain. For 3 doses    . simvastatin (ZOCOR) 20 MG tablet TAKE 1 TABLET BY MOUTH AT  BEDTIME 90 tablet 3  . vitamin B-12 (CYANOCOBALAMIN) 1000 MCG tablet Take 1 tablet (1,000 mcg total) by mouth daily. 90 tablet 3   No current facility-administered medications for this visit.     REVIEW OF SYSTEMS:  [X]  denotes positive finding, [ ]  denotes negative finding Cardiac  Comments:  Chest pain or chest pressure:    Shortness of breath upon exertion:    Short of breath when lying flat:    Irregular heart rhythm:        Vascular    Pain in calf, thigh, or hip brought on by ambulation:    Pain in feet at night that wakes you up from your sleep:     Blood clot in your veins:    Leg swelling:         Pulmonary    Oxygen at home:    Productive cough:     Wheezing:         Neurologic    Sudden weakness in arms or legs:     Sudden numbness in arms or legs:     Sudden onset of difficulty speaking or slurred speech:    Temporary loss of vision in one eye:     Problems with dizziness:         Gastrointestinal    Blood in stool:     Vomited blood:         Genitourinary    Burning when urinating:     Blood in urine:        Psychiatric    Major depression:         Hematologic    Bleeding problems:    Problems with blood clotting too easily:        Skin    Rashes or ulcers:        Constitutional    Fever or chills:     PHYSICAL EXAM:   Vitals:   12/14/17 0829  BP: (!) 143/58  Pulse: 65  Resp: 18  SpO2: 96%  Weight: 191 lb 6.4 oz (86.8 kg)  Height: 5\' 10"  (1.778 m)    GENERAL: The patient is a well-nourished male, in no acute  distress. The vital signs are documented above. CARDIAC: There is a regular rate and rhythm.  VASCULAR: I do not detect carotid bruits. He has palpable femoral,  popliteal, and posterior tibial pulses bilaterally. PULMONARY: There is good air exchange bilaterally without wheezing or rales. ABDOMEN: Soft and non-tender with normal pitched bowel sounds.  His aneurysm is palpable and nontender. MUSCULOSKELETAL: There are no major deformities or cyanosis. NEUROLOGIC: No focal weakness or paresthesias are detected. SKIN: There are no ulcers or rashes noted. PSYCHIATRIC: The patient has a normal affect.  DATA:    DUPLEX ABDOMINAL AORTA: I have independently interpreted his duplex of the abdominal aorta.  The maximum diameter of the aneurysm is 4.0 cm.  Thus the aneurysm has not increased in size.  The right common iliac artery measures 1.6 cm in maximum diameter.  The left common iliac artery measures 1.8 cm in maximum diameter.  Of note duplex of the abdominal aorta shows that there are elevated velocities in the right common iliac artery and left common iliac artery.  Based on the velocities I would suspect these are about 50%.  CT ABDOMEN PELVIS: I did review his CT that was done on 10/07/2017.  This shows that the maximum diameter of his aneurysm is 4.0 cm.  He does have calcific disease in his iliac arteries.  MEDICAL ISSUES:   4.0 CM INFRARENAL ABDOMINAL AORTIC ANEURYSM: Duplex today and CT scan in September show a 4.0 cm infrarenal abdominal aortic aneurysm.  I think we can stretch his follow-up out to 1 year and I have ordered a duplex at that time.  He understands we would not consider elective repair in a normal risk patient unless it reached 5.5 cm in maximum diameter.  He does continue to smoke 4 to 5 cigarettes a day and I again discussed with him the importance of complete tobacco cessation as continued tobacco use does increase his risk of aneurysm expansion.  His blood pressure is under good control.  I will see him back in 1 year.  He knows to call sooner if he has problems.  Of note his carotid disease is followed by cardiology.  He is  asymptomatic.  Deitra Mayo [facility] [phone]

## 2017-12-18 ENCOUNTER — Other Ambulatory Visit: Payer: Self-pay | Admitting: Family Medicine

## 2017-12-21 ENCOUNTER — Other Ambulatory Visit: Payer: Self-pay | Admitting: Oncology

## 2017-12-25 ENCOUNTER — Other Ambulatory Visit: Payer: Self-pay | Admitting: Cardiovascular Disease

## 2018-01-02 ENCOUNTER — Encounter: Payer: Self-pay | Admitting: Oncology

## 2018-01-02 ENCOUNTER — Ambulatory Visit: Payer: Medicare Other | Admitting: Oncology

## 2018-01-02 ENCOUNTER — Other Ambulatory Visit: Payer: Self-pay

## 2018-01-02 VITALS — BP 141/53 | HR 60 | Temp 97.9°F | Wt 194.9 lb

## 2018-01-02 DIAGNOSIS — D539 Nutritional anemia, unspecified: Secondary | ICD-10-CM

## 2018-01-02 DIAGNOSIS — Z23 Encounter for immunization: Secondary | ICD-10-CM | POA: Diagnosis not present

## 2018-01-02 DIAGNOSIS — N183 Chronic kidney disease, stage 3 (moderate): Secondary | ICD-10-CM | POA: Diagnosis not present

## 2018-01-02 DIAGNOSIS — D469 Myelodysplastic syndrome, unspecified: Secondary | ICD-10-CM | POA: Diagnosis not present

## 2018-01-02 DIAGNOSIS — D890 Polyclonal hypergammaglobulinemia: Secondary | ICD-10-CM

## 2018-01-02 DIAGNOSIS — M109 Gout, unspecified: Secondary | ICD-10-CM

## 2018-01-02 NOTE — Progress Notes (Signed)
Patient comes in today to discuss results of recent December 05, 2017 bone marrow aspiration, biopsy, and cytogenetics done as ongoing evaluation of acute on chronic macrocytic anemia with borderline elevation of white count and platelets.  No monocytosis.  Please see previous notes for complete details of recent evaluation. Bone marrow is hypercellular.  Scattered paratrabecular lymphoid aggregates.  No monoclonal populations on immunohistochemical staining or flow cytometry.  3% blasts.  Rare ring sideroblasts.  Dyserythropoiesis.  Dysmegakaryopoiesis.  No deletion in chromosomes 5 or 7.  Loss of Y chromosome which is normal for age. I did start him on vitamin B6 (pyridoxine). 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  Weight has stabilized.  Appetite remains spotty. Extensive evaluation to rule out occult malignancy negative.  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 Other contributions to his anemia include stage III chronic renal insufficiency and chronic gouty arthritis.  I discussed these findings in detail with the patient and gave him written notes to help him understand this bone marrow disorder. I am going to check a baseline erythropoietin level. Check for presence of aJAK-2 mutation His hemoglobin has stabilized over the last 3 months and is currently 9.6 as of November 4. If hemoglobin is consistently less than 10 g or drifts back down in the 8 g range, he would be a good candidate for an ESA.  I will transition his hematology care to Dr.Feng at the [facility].

## 2018-01-02 NOTE — Patient Instructions (Addendum)
To lab today - we will call with results  We will make a referral to one of the Hematologists at the [facility] for your ongoing Hematology care. They should call you for an appointment after the new year. If you don't hear from anyone by the end of January, call [phone] to schedule with Dr Burr Medico.

## 2018-01-03 LAB — CBC WITH DIFFERENTIAL/PLATELET
Basophils Absolute: 0.2 10*3/uL (ref 0.0–0.2)
Basos: 2 %
EOS (ABSOLUTE): 0.3 10*3/uL (ref 0.0–0.4)
Eos: 3 %
Hematocrit: 29.2 % — ABNORMAL LOW (ref 37.5–51.0)
Hemoglobin: 9.8 g/dL — ABNORMAL LOW (ref 13.0–17.7)
Immature Grans (Abs): 0.2 10*3/uL — ABNORMAL HIGH (ref 0.0–0.1)
Immature Granulocytes: 2 %
Lymphocytes Absolute: 3.3 10*3/uL — ABNORMAL HIGH (ref 0.7–3.1)
Lymphs: 28 %
MCH: 31 pg (ref 26.6–33.0)
MCHC: 33.6 g/dL (ref 31.5–35.7)
MCV: 92 fL (ref 79–97)
Monocytes Absolute: 0.6 10*3/uL (ref 0.1–0.9)
Monocytes: 5 %
Neutrophils Absolute: 6.9 10*3/uL (ref 1.4–7.0)
Neutrophils: 60 %
Platelets: 378 10*3/uL (ref 150–450)
RBC: 3.16 x10E6/uL — ABNORMAL LOW (ref 4.14–5.80)
RDW: 21.1 % — ABNORMAL HIGH (ref 12.3–15.4)
WBC: 11.5 10*3/uL — ABNORMAL HIGH (ref 3.4–10.8)

## 2018-01-03 LAB — ERYTHROPOIETIN: Erythropoietin: 21.2 m[IU]/mL — ABNORMAL HIGH (ref 2.6–18.5)

## 2018-01-03 LAB — RETICULOCYTES: Retic Ct Pct: 2.3 % (ref 0.6–2.6)

## 2018-01-03 NOTE — Addendum Note (Signed)
Addended by: Truddie Crumble on: 01/03/2018 10:13 AM   Modules accepted: Orders

## 2018-01-05 ENCOUNTER — Other Ambulatory Visit (INDEPENDENT_AMBULATORY_CARE_PROVIDER_SITE_OTHER): Payer: Medicare Other

## 2018-01-05 ENCOUNTER — Telehealth: Payer: Self-pay | Admitting: *Deleted

## 2018-01-05 DIAGNOSIS — D469 Myelodysplastic syndrome, unspecified: Secondary | ICD-10-CM | POA: Diagnosis not present

## 2018-01-05 NOTE — Telephone Encounter (Signed)
Pt called / informed "Hemoglobin stable and a little better than last time at 9.8." per Dr Beryle Beams. Also informed Dr Darnell Level would like for him to return for another blood test he forgot to order - stated he will be here today @ 1100AM.

## 2018-01-05 NOTE — Telephone Encounter (Signed)
-----   Message from Annia Belt, MD sent at 01/03/2018 10:22 AM EST ----- Call pt:  Hemoglobin stable and a little better than last time at 9.8. Lab is going to call him back for a blood test I forgot to order yesterday

## 2018-01-05 NOTE — Telephone Encounter (Signed)
Noted thanks °

## 2018-01-10 LAB — JAK2 GENOTYPR

## 2018-01-11 ENCOUNTER — Telehealth: Payer: Self-pay | Admitting: *Deleted

## 2018-01-11 NOTE — Telephone Encounter (Signed)
Pt called / informed "genetic test shows no mutation in gene that can cause a blood disorder similar to his. This is a good result " per Dr Beryle Beams. Stated "good" and thanks for calling.

## 2018-01-11 NOTE — Telephone Encounter (Signed)
-----   Message from Annia Belt, MD sent at 01/10/2018  3:50 PM EST ----- Call pt: genetic test shows no mutation in gene that can cause a blood disorder similar to his. This is a good result

## 2018-02-06 ENCOUNTER — Other Ambulatory Visit: Payer: Self-pay

## 2018-02-06 ENCOUNTER — Telehealth: Payer: Self-pay

## 2018-02-06 MED ORDER — GLUCOSE BLOOD VI STRP: ORAL_STRIP | 4 refills | Status: DC

## 2018-02-06 NOTE — Telephone Encounter (Signed)
Patient called to get a refill on his test strips.

## 2018-03-30 ENCOUNTER — Encounter: Payer: Self-pay | Admitting: Hematology

## 2018-03-30 ENCOUNTER — Telehealth: Payer: Self-pay | Admitting: Hematology

## 2018-03-30 NOTE — Telephone Encounter (Signed)
New hem appt has been scheduled for the pt to see Dr. Burr Medico on 3/18 at 1pm. Letter mailed.

## 2018-04-05 ENCOUNTER — Other Ambulatory Visit: Payer: Self-pay | Admitting: Family Medicine

## 2018-04-05 ENCOUNTER — Other Ambulatory Visit: Payer: Self-pay | Admitting: Cardiovascular Disease

## 2018-04-05 DIAGNOSIS — I2510 Atherosclerotic heart disease of native coronary artery without angina pectoris: Secondary | ICD-10-CM

## 2018-04-05 DIAGNOSIS — E7849 Other hyperlipidemia: Secondary | ICD-10-CM

## 2018-04-17 ENCOUNTER — Encounter: Payer: Self-pay | Admitting: *Deleted

## 2018-04-19 ENCOUNTER — Inpatient Hospital Stay: Payer: Medicare Other

## 2018-04-19 ENCOUNTER — Telehealth: Payer: Self-pay | Admitting: Hematology

## 2018-04-19 ENCOUNTER — Inpatient Hospital Stay: Payer: Medicare Other | Attending: Hematology | Admitting: Hematology

## 2018-04-19 ENCOUNTER — Other Ambulatory Visit: Payer: Self-pay

## 2018-04-19 ENCOUNTER — Encounter: Payer: Self-pay | Admitting: Hematology

## 2018-04-19 VITALS — BP 141/54 | HR 66 | Temp 97.8°F | Resp 18 | Ht 70.0 in | Wt 191.0 lb

## 2018-04-19 DIAGNOSIS — Z7982 Long term (current) use of aspirin: Secondary | ICD-10-CM | POA: Insufficient documentation

## 2018-04-19 DIAGNOSIS — F17210 Nicotine dependence, cigarettes, uncomplicated: Secondary | ICD-10-CM | POA: Diagnosis not present

## 2018-04-19 DIAGNOSIS — Z809 Family history of malignant neoplasm, unspecified: Secondary | ICD-10-CM | POA: Insufficient documentation

## 2018-04-19 DIAGNOSIS — E538 Deficiency of other specified B group vitamins: Secondary | ICD-10-CM | POA: Insufficient documentation

## 2018-04-19 DIAGNOSIS — E559 Vitamin D deficiency, unspecified: Secondary | ICD-10-CM | POA: Diagnosis not present

## 2018-04-19 DIAGNOSIS — G8929 Other chronic pain: Secondary | ICD-10-CM | POA: Diagnosis not present

## 2018-04-19 DIAGNOSIS — Z86718 Personal history of other venous thrombosis and embolism: Secondary | ICD-10-CM | POA: Insufficient documentation

## 2018-04-19 DIAGNOSIS — Z79899 Other long term (current) drug therapy: Secondary | ICD-10-CM

## 2018-04-19 DIAGNOSIS — E785 Hyperlipidemia, unspecified: Secondary | ICD-10-CM | POA: Insufficient documentation

## 2018-04-19 DIAGNOSIS — I714 Abdominal aortic aneurysm, without rupture: Secondary | ICD-10-CM | POA: Diagnosis not present

## 2018-04-19 DIAGNOSIS — K7460 Unspecified cirrhosis of liver: Secondary | ICD-10-CM | POA: Insufficient documentation

## 2018-04-19 DIAGNOSIS — Z8711 Personal history of peptic ulcer disease: Secondary | ICD-10-CM | POA: Diagnosis not present

## 2018-04-19 DIAGNOSIS — K219 Gastro-esophageal reflux disease without esophagitis: Secondary | ICD-10-CM

## 2018-04-19 DIAGNOSIS — E1140 Type 2 diabetes mellitus with diabetic neuropathy, unspecified: Secondary | ICD-10-CM | POA: Insufficient documentation

## 2018-04-19 DIAGNOSIS — D469 Myelodysplastic syndrome, unspecified: Secondary | ICD-10-CM | POA: Insufficient documentation

## 2018-04-19 DIAGNOSIS — M109 Gout, unspecified: Secondary | ICD-10-CM | POA: Diagnosis not present

## 2018-04-19 DIAGNOSIS — J439 Emphysema, unspecified: Secondary | ICD-10-CM | POA: Insufficient documentation

## 2018-04-19 DIAGNOSIS — N183 Chronic kidney disease, stage 3 (moderate): Secondary | ICD-10-CM

## 2018-04-19 DIAGNOSIS — K5900 Constipation, unspecified: Secondary | ICD-10-CM | POA: Insufficient documentation

## 2018-04-19 DIAGNOSIS — M129 Arthropathy, unspecified: Secondary | ICD-10-CM | POA: Diagnosis not present

## 2018-04-19 DIAGNOSIS — I129 Hypertensive chronic kidney disease with stage 1 through stage 4 chronic kidney disease, or unspecified chronic kidney disease: Secondary | ICD-10-CM | POA: Diagnosis not present

## 2018-04-19 DIAGNOSIS — I2510 Atherosclerotic heart disease of native coronary artery without angina pectoris: Secondary | ICD-10-CM | POA: Diagnosis not present

## 2018-04-19 DIAGNOSIS — M549 Dorsalgia, unspecified: Secondary | ICD-10-CM

## 2018-04-19 LAB — CBC WITH DIFFERENTIAL (CANCER CENTER ONLY)
Abs Immature Granulocytes: 0.16 10*3/uL — ABNORMAL HIGH (ref 0.00–0.07)
Basophils Absolute: 0.2 10*3/uL — ABNORMAL HIGH (ref 0.0–0.1)
Basophils Relative: 2 %
Eosinophils Absolute: 0.3 10*3/uL (ref 0.0–0.5)
Eosinophils Relative: 3 %
HCT: 29 % — ABNORMAL LOW (ref 39.0–52.0)
Hemoglobin: 9.6 g/dL — ABNORMAL LOW (ref 13.0–17.0)
Immature Granulocytes: 1 %
Lymphocytes Relative: 24 %
Lymphs Abs: 2.7 10*3/uL (ref 0.7–4.0)
MCH: 30.1 pg (ref 26.0–34.0)
MCHC: 33.1 g/dL (ref 30.0–36.0)
MCV: 90.9 fL (ref 80.0–100.0)
Monocytes Absolute: 0.5 10*3/uL (ref 0.1–1.0)
Monocytes Relative: 5 %
Neutro Abs: 7.3 10*3/uL (ref 1.7–7.7)
Neutrophils Relative %: 65 %
Platelet Count: 325 10*3/uL (ref 150–400)
RBC: 3.19 MIL/uL — ABNORMAL LOW (ref 4.22–5.81)
RDW: 22.3 % — ABNORMAL HIGH (ref 11.5–15.5)
WBC Count: 11.1 10*3/uL — ABNORMAL HIGH (ref 4.0–10.5)
nRBC: 0 % (ref 0.0–0.2)

## 2018-04-19 LAB — RETIC PANEL
Immature Retic Fract: 19.9 % — ABNORMAL HIGH (ref 2.3–15.9)
RBC.: 3.19 MIL/uL — ABNORMAL LOW (ref 4.22–5.81)
Retic Count, Absolute: 105 10*3/uL (ref 19.0–186.0)
Retic Ct Pct: 3.3 % — ABNORMAL HIGH (ref 0.4–3.1)
Reticulocyte Hemoglobin: 33.6 pg (ref >27.9)

## 2018-04-19 LAB — IRON AND TIBC
Iron: 87 ug/dL (ref 42–163)
Saturation Ratios: 34 % (ref 20–55)
TIBC: 256 ug/dL (ref 202–409)
UIBC: 169 ug/dL (ref 117–376)

## 2018-04-19 LAB — FERRITIN: Ferritin: 140 ng/mL (ref 24–336)

## 2018-04-19 LAB — VITAMIN B12: Vitamin B-12: 915 pg/mL — ABNORMAL HIGH (ref 180–914)

## 2018-04-19 NOTE — Telephone Encounter (Signed)
Gave avs and calendar ° °

## 2018-04-19 NOTE — Progress Notes (Signed)
[place]   [phone] Fax:([phone]) [phone]   Clinic New Consult Note   Patient Care Team: Denita Lung, MD as PCP - General (Family Medicine)  Date of Service:  04/19/2018   CHIEF COMPLAINTS/PURPOSE OF CONSULTATION:  MDS/MPN, with mild leukocytosis and moderate anemia, JAK2 V617F (-)   Clinical summary per [name] 78 year old man I initially saw when he was hospitalized 2 years ago in 2017 with an allergic reaction to allopurinol resulting in drug induced hepatitis and thrombocytopenia. He was on the drug for chronic gout. Gout currently controlled with colchicine and Uloric.I just reevaluated him on September 27, 2017 for acute on chronic anemia, leukocytosis, thrombocytosis, and an unintentional 40 pound weight loss.  Repeat serum protein and immunofixation electrophoresis shows persistent elevation of IgG and IgA but polyclonal not amonoclonalpattern consistent with chronic liver disease.  December 05, 2017 bone marrow aspiration, biopsy, and cytogenetics done as ongoing evaluation of acute on chronic macrocytic anemia with borderline elevation of white count and platelets.  No monocytosis.  Please see previous notes for complete details of recent evaluation. Bone marrow is hypercellular.  Scattered paratrabecular lymphoid aggregates.  No monoclonal populations on immunohistochemical staining or flow cytometry.  3% blasts.  Rare ring sideroblasts.  Dyserythropoiesis.  Dysmegakaryopoiesis.  No deletion in chromosomes 5 or 7.  Loss of Y chromosome which is normal for age. Fairly exhaustive evaluation to exclude occult malignancy has been unrevealing.  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 NEGATIVE for the JAK2 V617F mutation.   HISTORY OF PRESENTING ILLNESS:  Paul [name] 78 y.o. 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 The patient was previously under the care of [name] and has now transferred  his care to me. The patient presents to the clinic today accompanied by his sister.   He notes he has not required any treatment so far.  He has not required blood transfusion. He notes he can walk independently. He denies pain with walking. But notes SOB for the past 2 years. He denies chest pain or abdominal issues.   Today the patient notes he lives alone an abel to take care of himself but is not very active. He will still go places as needed. He note she was on oral iron but stopped 1 month ago due to constipation.   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 He currently smokes 1/2 ppd. He did quit for 30 years and restarted 10 years ago. He used to drink alcohol moderately for years. He stopped officially 10 years ago. He notes he is married but separated and lives alone. He doe snot have children. He is able to do every independently. His sister lives very close to him.  They have a PMHx of DM. He stopped metformin due to diarrhea and his levels remain stable.  He has had gout for 20 years and was put on allopurinol and was found to be allergic to it. He is currently on different medication and tolerating well. He has HTN and he notes h/o of LE DVT in 2017 and was treated with xarelto for 3 months. He notes his AAA has decreased and is seen by Dr. Doren Custard, vascular surgeon. He has had several orthopedic surgeries in the past and stent placed in his heart.     REVIEW OF SYSTEMS:   Constitutional: Denies fevers, chills or abnormal night sweats Eyes: Denies blurriness of vision, double vision or watery eyes Ears, nose, mouth, throat, and face:  Denies mucositis or sore throat Respiratory: Denies cough and wheezes (+) SOB upon exertion  Cardiovascular: Denies palpitation, chest discomfort or lower extremity swelling Gastrointestinal:  Denies nausea, heartburn or change in bowel habits Skin: Denies abnormal skin rashes MSK: (+) Gout  Lymphatics: Denies new lymphadenopathy or easy  bruising Neurological:Denies numbness, tingling or new weaknesses Behavioral/Psych: Mood is stable, no new changes  All other systems were reviewed with the patient and are negative.  MEDICAL HISTORY:  Past Medical History:  Diagnosis Date  . AAA (abdominal aortic aneurysm) ([place])   . Abnormal LFTs   . AKI (acute kidney injury) ([place])   . Anemia of chronic disease   . Arthritis    "all over" (12/04/2015)  . Atherosclerosis of native coronary artery of native heart without angina pectoris   . CAD (coronary artery disease)    with cath 09/26/08 showing severe heart disease in RCA with POBA, mild to mod diesease in circ and LAD  . Chronic back pain    back problems prior to surgery   . Constipation   . Coronary atherosclerosis of native coronary artery   . Dehydration   . Diabetes mellitus    with neuropathy  . Diarrhea   . Elevated liver enzymes   . Generalized weakness   . Gout of right hand   . Hiatal hernia   . History of gastroesophageal reflux (GERD)   . History of stomach ulcers   . Hyperlipidemia   . Hypertension   . Metabolic acidosis   . Normochromic anemia 02/23/2016  . 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 Thrombocytopenia ([place])   . Unintentional weight loss 09/27/2017   33 lbs between 4/19 & 8/19  . Vitamin B12 deficiency   . Vitamin B12 deficiency   . Vitamin D deficiency   . Weakness     SURGICAL HISTORY: Past Surgical History:  Procedure Laterality Date  . ANKLE FRACTURE SURGERY Left    "crushed"  . BACK SURGERY  X 3   "fell 4 stories in 1977"  . CARPAL TUNNEL RELEASE Left   . CORONARY ANGIOPLASTY WITH STENT PLACEMENT  09/26/2008   [name] 06/03/2010  . FRACTURE SURGERY    . LEFT HEART CATHETERIZATION WITH CORONARY ANGIOGRAM N/A 01/21/2012   Procedure: LEFT HEART CATHETERIZATION WITH CORONARY ANGIOGRAM;  Surgeon: Burnell Blanks, MD;  Location: [facility] CATH LAB;  Service: Cardiovascular;  Laterality: N/A;  . LUMBAR [place]; ~ 1980  . POSTERIOR  LUMBAR FUSION  ~ 1998   "3rd OR to reconstruct my back after fall in 1977"  . 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 and 2nd    SOCIAL HISTORY: Social History   Socioeconomic History  . Marital status: Legally Separated    Spouse name: Not on file  . Number of children: 0  . Years of education: Not on file  . Highest education level: Not on file  Occupational History  . Not on file  Social Needs  . Financial resource strain: Not on file  . Food insecurity:    Worry: Not on file    Inability: Not on file  . Transportation needs:    Medical: Not on file    Non-medical: Not on file  Tobacco Use  . Smoking status: Current Every Day Smoker    Packs/day: 0.50    Years: 20.00    Pack years: 10.00    Types: Cigars    Last attempt to quit: 01/24/2013    Years since quitting: 5.2  .  Smokeless tobacco: Never Used  Substance and Sexual Activity  . Alcohol use: Yes    Comment: "Moonshine" lately. moderate drinker for 20 years   . Drug use: No  . Sexual activity: Not on file  Lifestyle  . Physical activity:    Days per week: Not on file    Minutes per session: Not on file  . Stress: Not on file  Relationships  . Social connections:    Talks on phone: Not on file    Gets together: Not on file    Attends religious service: Not on file    Active member of club or organization: Not on file    Attends meetings of clubs or organizations: Not on file    Relationship status: Not on file  . Intimate partner violence:    Fear of current or ex partner: Not on file    Emotionally abused: Not on file    Physically abused: Not on file    Forced sexual activity: Not on file  Other Topics Concern  . Not on file  Social History Narrative   Divorced, 1 child.    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    Owned roofing co. In [place]    FAMILY HISTORY: Family History  Problem Relation Age of Onset  . Heart attack Mother   . Cancer Mother   . Heart disease Mother    . Heart attack Sister   . Heart disease Sister     ALLERGIES:  is allergic to allopurinol and metformin and related.  MEDICATIONS:  Current Outpatient Medications  Medication Sig Dispense Refill  . aspirin 81 MG chewable tablet Chew 81 mg by mouth daily.    . clopidogrel (PLAVIX) 75 MG tablet TAKE 1 TABLET BY MOUTH  DAILY 90 tablet 0  . colchicine 0.6 MG tablet TAKE 1 TABLET BY MOUTH  DAILY 90 tablet 1  . febuxostat (ULORIC) 40 MG tablet Take 1 tablet (40 mg total) by mouth daily. (Patient taking differently: Take 80 mg by mouth daily. ) 90 tablet 3  . fish oil-omega-3 fatty acids 1000 MG capsule Take 1 g by mouth 2 (two) times daily.     Marland Kitchen glucose blood (ACCU-CHEK AVIVA PLUS) test strip Test once to twice daily 200 each 4  . isosorbide mononitrate (IMDUR) 30 MG 24 hr tablet TAKE 1 TABLET BY MOUTH  TWICE A DAY 180 tablet 3  . metoprolol tartrate (LOPRESSOR) 25 MG tablet TAKE 1 TABLET BY MOUTH 2  TIMES DAILY 180 tablet 3  . Multiple Vitamins-Minerals (CENTRUM) tablet Take 1 tablet by mouth daily.    . nitroGLYCERIN (NITROSTAT) 0.4 MG SL tablet Place 0.4 mg under the tongue every 5 (five) minutes as needed for chest pain. For 3 doses    . pyridoxine (B-6) 100 MG tablet Take 100 mg by mouth daily.    . simvastatin (ZOCOR) 20 MG tablet TAKE 1 TABLET BY MOUTH AT  BEDTIME 90 tablet 2  . vitamin B-12 (CYANOCOBALAMIN) 1000 MCG tablet Take 1 tablet (1,000 mcg total) by mouth daily. 90 tablet 3   No current facility-administered medications for this visit.     PHYSICAL EXAMINATION: ECOG PERFORMANCE STATUS: 3 - Symptomatic, >50% confined to bed  Vitals:   04/19/18 1306  BP: (!) 141/54  Pulse: 66  Resp: 18  Temp: 97.8 F (36.6 C)  SpO2: 97%   Filed Weights   04/19/18 1306  Weight: 191 lb (86.6 kg)    GENERAL:alert,  no distress and comfortable SKIN: skin color, texture, turgor are normal, no rashes or significant lesions EYES: normal, conjunctiva are pink and non-injected, sclera  clear OROPHARYNX:no exudate, no erythema and lips, buccal mucosa, and tongue normal  NECK: supple, thyroid normal size, non-tender, without nodularity LYMPH:  no palpable lymphadenopathy in the cervical, axillary or inguinal LUNGS: clear to auscultation except mild crackles on bilateral lung bases, normal breathing effort HEART: regular rate & rhythm and no murmurs and no lower extremity edema ABDOMEN:abdomen soft, non-tender and normal bowel sounds (+) No hepatosplenomegaly  Musculoskeletal:no cyanosis of digits and no clubbing  PSYCH: alert & oriented x 3 with fluent speech NEURO: no focal motor/sensory deficits  LABORATORY DATA:  I have reviewed the data as listed CBC Latest Ref Rng & Units 04/19/2018 01/02/2018 12/05/2017  WBC 4.0 - 10.5 K/uL 11.1(H) 11.5(H) 13.3(H)  Hemoglobin 13.0 - 17.0 g/dL 9.6(L) 9.8(L) 9.6(L)  Hematocrit 39.0 - 52.0 % 29.0(L) 29.2(L) 29.2(L)  Platelets 150 - 400 K/uL 325 378 382    CMP Latest Ref Rng & Units 12/05/2017 09/27/2017 09/19/2017  Glucose 70 - 99 mg/dL 103(H) - 116(H)  BUN 8 - 23 mg/dL 26(H) - 23  Creatinine 0.61 - 1.24 mg/dL 1.24 - 1.25  Sodium 135 - 145 mmol/L 137 - 138  Potassium 3.5 - 5.1 mmol/L 4.1 - 4.3  Chloride 98 - 111 mmol/L 106 - 104  CO2 22 - 32 mmol/L 24 - 20  Calcium 8.9 - 10.3 mg/dL 9.0 - 9.1  Total Protein 6.0 - 8.5 g/dL - 7.3 7.0  Total Bilirubin 0.0 - 1.2 mg/dL - - 0.7  Alkaline Phos 39 - 117 IU/L - - 106  AST 0 - 40 IU/L - - 29  ALT 0 - 44 IU/L - - 23     RADIOGRAPHIC STUDIES: I have personally reviewed the radiological images as listed and agreed with the findings in the report. No results found.  ASSESSMENT & PLAN:  MISTY RAGO is a 78 y.o. caucasian male with a history of AAA, Gout, DM, arthritis, HLD, HTN, vitamin deficiencies, H/o DVT of right femoral vein.    1. MDS/MPN, JAK2 (-), cytogenetics normal  -I reviewed his medical record extensively and discussed the key findings with pt. He has had anemia issues  since mid 2017 and Hg has in 9-10 range most of time, he also has mild leukocytosis, thrombocytosis resolved now -Other contributions to his anemia include stage III chronic renal insufficiency and chronic gouty arthritis.  Previous iron study and B12 level were normal.  She was on oral iron, off since a month ago due to constipation. -[name] did fairly exhaustive evaluation to exclude occult malignancy including CT CAP which has been unrevealing.  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  I reviewed with patient. -We discussed the nature history of MDS and MPN, risk of thrombosis from MPN.  He did have one episode of right lower extremity DVT, provoked, off Xarelto now.  His leukocytosis is very mild, no thrombocytosis, risk of thrombosis is not very high, no need treatment such as hydrea. He is currently on aspirin and Plavix, which also helps to prevent thrombosis. -He has not been on active treatment as his levels have been stable. Will monitor labs every 2 months. If Hg drop to 9-9.5 or below I will start Aranesp injections.   -Will get baseline labs today and monitor every 2 months  -f/u in 6 months    2.  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 He currently smokes 1/2ppd.  -I reviewed his CT chest from 11/2017 with pt in person which shows mild emphysema and small right pleural effusion, pulmonary parenchymal fibrosis, probable UIP.  strongly encouraged him to stop smoking to prevent further exacerbation and may need oxygen.  -I recommend he bee seen by Pulmonology for further workup. He will think about it    4. AAA, CAD with stent placed -He notes his AAA has decreased and is being followed by Dr. Doren Custard, vascular surgeon. -He does have blockage in heart  -On Plavix and ASA  5.  Mild liver cirrhosis  -He has long intermittent history of moderate alcohol drinking  -I reviewed his CT AP from 10/2017 which showed nodularity suspicious of cirrhosis.  -No hepatomegaly on exam  -monitor LFTs   6. H/o of right LE DVT, provoked  -Treated with 3 months of Xarelto, off now    PLAN:  Lab today and every 2 monthsX3 F/u in 6 months or sooner if his anemia gets worse     Orders Placed This Encounter  Procedures  . CBC with Differential (Cancer Center Only)    Standing Status:   Standing    Number of Occurrences:   100    Standing Expiration Date:   04/19/2019  . Retic Panel    Standing Status:   Standing    Number of Occurrences:   100    Standing Expiration Date:   04/19/2019  . Ferritin    Standing Status:   Standing    Number of Occurrences:   100    Standing Expiration Date:   04/19/2019  . Iron and TIBC    Standing Status:   Standing    Number of Occurrences:   100    Standing Expiration Date:   04/19/2019  . Vitamin B12    Standing Status:   Standing    Number of Occurrences:   100    Standing Expiration Date:   04/19/2019    All questions were answered. The patient knows to call the clinic with any problems, questions or concerns. I spent 30 minutes counseling the patient face to face. The total time spent in the appointment was 40 minutes and more than 50% was on counseling.     Truitt Merle, MD 04/19/2018 4:01 PM  I, Joslyn Devon, am acting as scribe for Truitt Merle, MD.   I have reviewed the above documentation for accuracy and completeness, and I agree with the above.

## 2018-04-20 ENCOUNTER — Telehealth: Payer: Self-pay

## 2018-04-20 NOTE — Telephone Encounter (Signed)
-----   Message from Truitt Merle, MD sent at 04/20/2018 10:52 AM EDT ----- Please let pt know his lab results, Hg 9.6, overall stable, no lab evidence of nutritional anemia, continue observation, thanks   Truitt Merle  04/20/2018

## 2018-04-20 NOTE — Telephone Encounter (Signed)
Spoke with patient regarding lab results, per Dr. Burr Medico Hg 9.6 overall stable, no lab evidence of nutritional anemia, we will continue to observe.  Patient verbalized an understanding.

## 2018-06-12 DIAGNOSIS — M109 Gout, unspecified: Secondary | ICD-10-CM | POA: Diagnosis not present

## 2018-06-12 DIAGNOSIS — D696 Thrombocytopenia, unspecified: Secondary | ICD-10-CM | POA: Diagnosis not present

## 2018-06-12 DIAGNOSIS — I82A11 Acute embolism and thrombosis of right axillary vein: Secondary | ICD-10-CM | POA: Diagnosis not present

## 2018-06-12 DIAGNOSIS — N183 Chronic kidney disease, stage 3 (moderate): Secondary | ICD-10-CM | POA: Diagnosis not present

## 2018-06-12 DIAGNOSIS — M25562 Pain in left knee: Secondary | ICD-10-CM | POA: Diagnosis not present

## 2018-06-14 ENCOUNTER — Other Ambulatory Visit: Payer: Self-pay | Admitting: Family Medicine

## 2018-06-14 DIAGNOSIS — I2510 Atherosclerotic heart disease of native coronary artery without angina pectoris: Secondary | ICD-10-CM

## 2018-06-14 NOTE — Telephone Encounter (Signed)
Is this ok to refill?  

## 2018-06-19 ENCOUNTER — Inpatient Hospital Stay: Payer: Medicare Other | Attending: Hematology

## 2018-06-19 ENCOUNTER — Other Ambulatory Visit: Payer: Self-pay

## 2018-06-19 DIAGNOSIS — Z79899 Other long term (current) drug therapy: Secondary | ICD-10-CM | POA: Insufficient documentation

## 2018-06-19 DIAGNOSIS — D469 Myelodysplastic syndrome, unspecified: Secondary | ICD-10-CM | POA: Insufficient documentation

## 2018-06-19 LAB — RETIC PANEL
Immature Retic Fract: 23 % — ABNORMAL HIGH (ref 2.3–15.9)
RBC.: 3.2 MIL/uL — ABNORMAL LOW (ref 4.22–5.81)
Retic Count, Absolute: 110.1 10*3/uL (ref 19.0–186.0)
Retic Ct Pct: 3.4 % — ABNORMAL HIGH (ref 0.4–3.1)
Reticulocyte Hemoglobin: 33 pg (ref >27.9)

## 2018-06-19 LAB — CBC WITH DIFFERENTIAL (CANCER CENTER ONLY)
Abs Immature Granulocytes: 0.29 10*3/uL — ABNORMAL HIGH (ref 0.00–0.07)
Basophils Absolute: 0.2 10*3/uL — ABNORMAL HIGH (ref 0.0–0.1)
Basophils Relative: 2 %
Eosinophils Absolute: 0.3 10*3/uL (ref 0.0–0.5)
Eosinophils Relative: 2 %
HCT: 28.9 % — ABNORMAL LOW (ref 39.0–52.0)
Hemoglobin: 9.7 g/dL — ABNORMAL LOW (ref 13.0–17.0)
Immature Granulocytes: 2 %
Lymphocytes Relative: 23 %
Lymphs Abs: 3.1 10*3/uL (ref 0.7–4.0)
MCH: 30.3 pg (ref 26.0–34.0)
MCHC: 33.6 g/dL (ref 30.0–36.0)
MCV: 90.3 fL (ref 80.0–100.0)
Monocytes Absolute: 0.5 10*3/uL (ref 0.1–1.0)
Monocytes Relative: 4 %
Neutro Abs: 9 10*3/uL — ABNORMAL HIGH (ref 1.7–7.7)
Neutrophils Relative %: 67 %
Platelet Count: 343 10*3/uL (ref 150–400)
RBC: 3.2 MIL/uL — ABNORMAL LOW (ref 4.22–5.81)
RDW: 22.7 % — ABNORMAL HIGH (ref 11.5–15.5)
WBC Count: 13.4 10*3/uL — ABNORMAL HIGH (ref 4.0–10.5)
nRBC: 0 % (ref 0.0–0.2)

## 2018-06-21 ENCOUNTER — Telehealth: Payer: Self-pay

## 2018-06-21 NOTE — Telephone Encounter (Signed)
Spoke with patient regarding lab results.  Per Dr. Burr Medico his mild anemia is stable, no other concerns, patient verbalized an understanding.

## 2018-06-21 NOTE — Telephone Encounter (Signed)
-----   Message from Truitt Merle, MD sent at 06/20/2018 10:40 PM EDT ----- Please let pt know the lab results, mild anemia is stable, no other concerns, thanks  Truitt Merle  06/20/2018

## 2018-08-21 ENCOUNTER — Other Ambulatory Visit: Payer: Self-pay

## 2018-08-21 ENCOUNTER — Inpatient Hospital Stay: Payer: Medicare Other | Attending: Hematology

## 2018-08-21 DIAGNOSIS — D469 Myelodysplastic syndrome, unspecified: Secondary | ICD-10-CM | POA: Insufficient documentation

## 2018-08-21 LAB — CBC WITH DIFFERENTIAL (CANCER CENTER ONLY)
Abs Immature Granulocytes: 0.2 10*3/uL — ABNORMAL HIGH (ref 0.00–0.07)
Basophils Absolute: 0.2 10*3/uL — ABNORMAL HIGH (ref 0.0–0.1)
Basophils Relative: 2 %
Eosinophils Absolute: 0.3 10*3/uL (ref 0.0–0.5)
Eosinophils Relative: 2 %
HCT: 29.1 % — ABNORMAL LOW (ref 39.0–52.0)
Hemoglobin: 10 g/dL — ABNORMAL LOW (ref 13.0–17.0)
Immature Granulocytes: 2 %
Lymphocytes Relative: 25 %
Lymphs Abs: 2.9 10*3/uL (ref 0.7–4.0)
MCH: 30.3 pg (ref 26.0–34.0)
MCHC: 34.4 g/dL (ref 30.0–36.0)
MCV: 88.2 fL (ref 80.0–100.0)
Monocytes Absolute: 0.4 10*3/uL (ref 0.1–1.0)
Monocytes Relative: 3 %
Neutro Abs: 7.9 10*3/uL — ABNORMAL HIGH (ref 1.7–7.7)
Neutrophils Relative %: 66 %
Platelet Count: 401 10*3/uL — ABNORMAL HIGH (ref 150–400)
RBC: 3.3 MIL/uL — ABNORMAL LOW (ref 4.22–5.81)
RDW: 22.8 % — ABNORMAL HIGH (ref 11.5–15.5)
WBC Count: 11.9 10*3/uL — ABNORMAL HIGH (ref 4.0–10.5)
nRBC: 0 % (ref 0.0–0.2)

## 2018-08-21 LAB — RETIC PANEL
Immature Retic Fract: 15.4 % (ref 2.3–15.9)
RBC.: 3.29 MIL/uL — ABNORMAL LOW (ref 4.22–5.81)
Retic Count, Absolute: 107.6 10*3/uL (ref 19.0–186.0)
Retic Ct Pct: 3.3 % — ABNORMAL HIGH (ref 0.4–3.1)
Reticulocyte Hemoglobin: 33.9 pg (ref >27.9)

## 2018-08-21 LAB — FERRITIN: Ferritin: 136 ng/mL (ref 24–336)

## 2018-09-02 ENCOUNTER — Observation Stay (HOSPITAL_BASED_OUTPATIENT_CLINIC_OR_DEPARTMENT_OTHER): Payer: Medicare Other

## 2018-09-02 ENCOUNTER — Other Ambulatory Visit: Payer: Self-pay

## 2018-09-02 ENCOUNTER — Emergency Department (HOSPITAL_COMMUNITY): Payer: Medicare Other

## 2018-09-02 ENCOUNTER — Encounter (HOSPITAL_COMMUNITY): Payer: Self-pay

## 2018-09-02 ENCOUNTER — Observation Stay (HOSPITAL_COMMUNITY)
Admission: EM | Admit: 2018-09-02 | Discharge: 2018-09-03 | Disposition: A | Discharge status: Home / Self Care | Payer: Medicare Other | Attending: Internal Medicine | Admitting: Internal Medicine

## 2018-09-02 DIAGNOSIS — D649 Anemia, unspecified: Secondary | ICD-10-CM | POA: Insufficient documentation

## 2018-09-02 DIAGNOSIS — I10 Essential (primary) hypertension: Secondary | ICD-10-CM | POA: Diagnosis present

## 2018-09-02 DIAGNOSIS — Z7982 Long term (current) use of aspirin: Secondary | ICD-10-CM | POA: Diagnosis not present

## 2018-09-02 DIAGNOSIS — I25119 Atherosclerotic heart disease of native coronary artery with unspecified angina pectoris: Secondary | ICD-10-CM | POA: Diagnosis not present

## 2018-09-02 DIAGNOSIS — I499 Cardiac arrhythmia, unspecified: Secondary | ICD-10-CM | POA: Diagnosis not present

## 2018-09-02 DIAGNOSIS — I2510 Atherosclerotic heart disease of native coronary artery without angina pectoris: Secondary | ICD-10-CM | POA: Diagnosis not present

## 2018-09-02 DIAGNOSIS — Z79899 Other long term (current) drug therapy: Secondary | ICD-10-CM | POA: Diagnosis not present

## 2018-09-02 DIAGNOSIS — I200 Unstable angina: Secondary | ICD-10-CM

## 2018-09-02 DIAGNOSIS — E118 Type 2 diabetes mellitus with unspecified complications: Secondary | ICD-10-CM

## 2018-09-02 DIAGNOSIS — R0789 Other chest pain: Secondary | ICD-10-CM | POA: Diagnosis not present

## 2018-09-02 DIAGNOSIS — Z20828 Contact with and (suspected) exposure to other viral communicable diseases: Secondary | ICD-10-CM | POA: Insufficient documentation

## 2018-09-02 DIAGNOSIS — I351 Nonrheumatic aortic (valve) insufficiency: Secondary | ICD-10-CM | POA: Diagnosis not present

## 2018-09-02 DIAGNOSIS — C946 Myelodysplastic disease, not classified: Secondary | ICD-10-CM | POA: Diagnosis present

## 2018-09-02 DIAGNOSIS — I152 Hypertension secondary to endocrine disorders: Secondary | ICD-10-CM | POA: Diagnosis present

## 2018-09-02 DIAGNOSIS — D638 Anemia in other chronic diseases classified elsewhere: Secondary | ICD-10-CM | POA: Diagnosis present

## 2018-09-02 DIAGNOSIS — F17200 Nicotine dependence, unspecified, uncomplicated: Secondary | ICD-10-CM | POA: Diagnosis not present

## 2018-09-02 DIAGNOSIS — D469 Myelodysplastic syndrome, unspecified: Secondary | ICD-10-CM | POA: Diagnosis not present

## 2018-09-02 DIAGNOSIS — E785 Hyperlipidemia, unspecified: Secondary | ICD-10-CM

## 2018-09-02 DIAGNOSIS — D471 Chronic myeloproliferative disease: Secondary | ICD-10-CM | POA: Diagnosis not present

## 2018-09-02 DIAGNOSIS — I959 Hypotension, unspecified: Secondary | ICD-10-CM | POA: Diagnosis not present

## 2018-09-02 DIAGNOSIS — I259 Chronic ischemic heart disease, unspecified: Secondary | ICD-10-CM | POA: Diagnosis not present

## 2018-09-02 DIAGNOSIS — E1169 Type 2 diabetes mellitus with other specified complication: Secondary | ICD-10-CM | POA: Diagnosis not present

## 2018-09-02 DIAGNOSIS — E1159 Type 2 diabetes mellitus with other circulatory complications: Secondary | ICD-10-CM | POA: Diagnosis not present

## 2018-09-02 DIAGNOSIS — R079 Chest pain, unspecified: Secondary | ICD-10-CM | POA: Diagnosis present

## 2018-09-02 DIAGNOSIS — R0602 Shortness of breath: Secondary | ICD-10-CM | POA: Diagnosis not present

## 2018-09-02 LAB — CREATININE, SERUM
Creatinine, Ser: 1.24 mg/dL (ref 0.61–1.24)
GFR calc Af Amer: >60 mL/min (ref >60)
GFR calc non Af Amer: 56 mL/min — ABNORMAL LOW (ref >60)

## 2018-09-02 LAB — CBC WITH DIFFERENTIAL/PLATELET
Abs Immature Granulocytes: 0.19 10*3/uL — ABNORMAL HIGH (ref 0.00–0.07)
Basophils Absolute: 0.2 10*3/uL — ABNORMAL HIGH (ref 0.0–0.1)
Basophils Relative: 2 %
Eosinophils Absolute: 0.3 10*3/uL (ref 0.0–0.5)
Eosinophils Relative: 3 %
HCT: 26 % — ABNORMAL LOW (ref 39.0–52.0)
Hemoglobin: 8.9 g/dL — ABNORMAL LOW (ref 13.0–17.0)
Immature Granulocytes: 2 %
Lymphocytes Relative: 25 %
Lymphs Abs: 2.9 10*3/uL (ref 0.7–4.0)
MCH: 30.6 pg (ref 26.0–34.0)
MCHC: 34.2 g/dL (ref 30.0–36.0)
MCV: 89.3 fL (ref 80.0–100.0)
Monocytes Absolute: 0.3 10*3/uL (ref 0.1–1.0)
Monocytes Relative: 2 %
Neutro Abs: 7.4 10*3/uL (ref 1.7–7.7)
Neutrophils Relative %: 66 %
Platelets: 295 10*3/uL (ref 150–400)
RBC: 2.91 MIL/uL — ABNORMAL LOW (ref 4.22–5.81)
RDW: 22.3 % — ABNORMAL HIGH (ref 11.5–15.5)
WBC: 11.3 10*3/uL — ABNORMAL HIGH (ref 4.0–10.5)
nRBC: 0.2 % (ref 0.0–0.2)

## 2018-09-02 LAB — COMPREHENSIVE METABOLIC PANEL
ALT: 21 U/L (ref 0–44)
AST: 29 U/L (ref 15–41)
Albumin: 2.7 g/dL — ABNORMAL LOW (ref 3.5–5.0)
Alkaline Phosphatase: 71 U/L (ref 38–126)
Anion gap: 7 (ref 5–15)
BUN: 21 mg/dL (ref 8–23)
CO2: 21 mmol/L — ABNORMAL LOW (ref 22–32)
Calcium: 8.8 mg/dL — ABNORMAL LOW (ref 8.9–10.3)
Chloride: 108 mmol/L (ref 98–111)
Creatinine, Ser: 1.14 mg/dL (ref 0.61–1.24)
GFR calc Af Amer: >60 mL/min (ref >60)
GFR calc non Af Amer: >60 mL/min (ref >60)
Glucose, Bld: 154 mg/dL — ABNORMAL HIGH (ref 70–99)
Potassium: 3.8 mmol/L (ref 3.5–5.1)
Sodium: 136 mmol/L (ref 135–145)
Total Bilirubin: 0.9 mg/dL (ref 0.3–1.2)
Total Protein: 7.1 g/dL (ref 6.5–8.1)

## 2018-09-02 LAB — CBC
HCT: 25.4 % — ABNORMAL LOW (ref 39.0–52.0)
Hemoglobin: 8.6 g/dL — ABNORMAL LOW (ref 13.0–17.0)
MCH: 30.2 pg (ref 26.0–34.0)
MCHC: 33.9 g/dL (ref 30.0–36.0)
MCV: 89.1 fL (ref 80.0–100.0)
Platelets: 260 10*3/uL (ref 150–400)
RBC: 2.85 MIL/uL — ABNORMAL LOW (ref 4.22–5.81)
RDW: 22.5 % — ABNORMAL HIGH (ref 11.5–15.5)
WBC: 11.7 10*3/uL — ABNORMAL HIGH (ref 4.0–10.5)
nRBC: 0.2 % (ref 0.0–0.2)

## 2018-09-02 LAB — TROPONIN I (HIGH SENSITIVITY)
Troponin I (High Sensitivity): 6 ng/L (ref <18)
Troponin I (High Sensitivity): 7 ng/L (ref <18)

## 2018-09-02 LAB — ECHOCARDIOGRAM COMPLETE

## 2018-09-02 LAB — HEMOGLOBIN AND HEMATOCRIT, BLOOD
HCT: 26.2 % — ABNORMAL LOW (ref 39.0–52.0)
HCT: 27.7 % — ABNORMAL LOW (ref 39.0–52.0)
Hemoglobin: 8.9 g/dL — ABNORMAL LOW (ref 13.0–17.0)
Hemoglobin: 9.7 g/dL — ABNORMAL LOW (ref 13.0–17.0)

## 2018-09-02 LAB — HEMOGLOBIN A1C
Hgb A1c MFr Bld: 5.2 % (ref 4.8–5.6)
Mean Plasma Glucose: 102.54 mg/dL

## 2018-09-02 LAB — GLUCOSE, CAPILLARY
Glucose-Capillary: 154 mg/dL — ABNORMAL HIGH (ref 70–99)
Glucose-Capillary: 99 mg/dL (ref 70–99)

## 2018-09-02 LAB — LIPASE, BLOOD: Lipase: 40 U/L (ref 11–51)

## 2018-09-02 LAB — CBG MONITORING, ED
Glucose-Capillary: 97 mg/dL (ref 70–99)
Glucose-Capillary: 119 mg/dL — ABNORMAL HIGH (ref 70–99)

## 2018-09-02 LAB — ABO/RH: ABO/RH(D): O POS

## 2018-09-02 LAB — SARS CORONAVIRUS 2 (TAT 6-24 HRS): SARS Coronavirus 2: NEGATIVE

## 2018-09-02 LAB — PREPARE RBC (CROSSMATCH)

## 2018-09-02 MED ORDER — ACETAMINOPHEN 325 MG PO TABS: 650.0000 mg | ORAL_TABLET | ORAL | Status: DC | PRN

## 2018-09-02 MED ORDER — CLOPIDOGREL BISULFATE 75 MG PO TABS
75.0000 mg | ORAL_TABLET | Freq: Every day | ORAL | Status: DC
  Administered 2018-09-02 – 2018-09-03 (×2): 75 mg via ORAL
  Filled 2018-09-02 (×2): qty 1

## 2018-09-02 MED ORDER — ONDANSETRON HCL 4 MG/2ML IJ SOLN: 4.0000 mg | Freq: Four times a day (QID) | INTRAMUSCULAR | Status: DC | PRN

## 2018-09-02 MED ORDER — SIMVASTATIN 20 MG PO TABS
20.0000 mg | ORAL_TABLET | Freq: Every day | ORAL | Status: DC
  Administered 2018-09-02: 20 mg via ORAL
  Filled 2018-09-02: qty 1

## 2018-09-02 MED ORDER — SODIUM CHLORIDE 0.9% IV SOLUTION
Freq: Once | INTRAVENOUS | Status: AC
  Administered 2018-09-02: 18:00:00 via INTRAVENOUS

## 2018-09-02 MED ORDER — METOPROLOL TARTRATE 25 MG PO TABS
25.0000 mg | ORAL_TABLET | Freq: Two times a day (BID) | ORAL | Status: DC
  Administered 2018-09-02 – 2018-09-03 (×2): 25 mg via ORAL
  Filled 2018-09-02 (×2): qty 1

## 2018-09-02 MED ORDER — ASPIRIN 81 MG PO CHEW
81.0000 mg | CHEWABLE_TABLET | Freq: Every day | ORAL | Status: DC
  Administered 2018-09-03: 81 mg via ORAL
  Filled 2018-09-02: qty 1

## 2018-09-02 MED ORDER — ISOSORBIDE MONONITRATE ER 30 MG PO TB24
30.0000 mg | ORAL_TABLET | Freq: Two times a day (BID) | ORAL | Status: DC
  Administered 2018-09-02 – 2018-09-03 (×2): 30 mg via ORAL
  Filled 2018-09-02 (×2): qty 1

## 2018-09-02 MED ORDER — ENOXAPARIN SODIUM 40 MG/0.4ML ~~LOC~~ SOLN
40.0000 mg | Freq: Every day | SUBCUTANEOUS | Status: DC
  Administered 2018-09-02: 40 mg via SUBCUTANEOUS
  Filled 2018-09-02 (×2): qty 0.4

## 2018-09-02 MED ORDER — NITROGLYCERIN 0.4 MG SL SUBL: 0.4000 mg | SUBLINGUAL_TABLET | SUBLINGUAL | Status: DC | PRN

## 2018-09-02 MED ORDER — INSULIN ASPART 100 UNIT/ML ~~LOC~~ SOLN: 0.0000 [IU] | Freq: Three times a day (TID) | SUBCUTANEOUS | Status: DC

## 2018-09-02 MED ORDER — INSULIN ASPART 100 UNIT/ML ~~LOC~~ SOLN: 0.0000 [IU] | Freq: Every day | SUBCUTANEOUS | Status: DC

## 2018-09-02 NOTE — ED Provider Notes (Signed)
[facility] EMERGENCY DEPARTMENT Provider Note   CSN: [id number] Arrival date & time: 09/02/18  0202     History   Chief Complaint Chief Complaint  Patient presents with  . Chest Pain    HPI Paul [name] is a 78 y.o. male.     Patient here with episode of chest pain onset after he went to lie down to go to sleep.  States he was sitting in his recliner and walked to go to his bed.  Upon lying down he felt pressure over his chest that radiated to both sides.  This was associated with some shortness of breath.  He went back to his recliner with no change in the pain.  EMS was called and gave him aspirin and nitroglycerin the pain is since resolved.  He believes it lasted about 30 minutes.  Patient reports he occasionally gets pain in his upper chest when he walks around but this pain was different in different location.  He has not had this type of pain before.  There was no nausea, vomiting, diaphoresis.  No cough or fever.  Patient has known CAD and states 1 of his arteries is occluded.  He denies any abdominal pain or back pain.  He denies any dizziness or lightheadedness.  No nausea vomiting, cough or fever.  The history is provided by the patient and the EMS personnel.  Chest Pain Associated symptoms: shortness of breath   Associated symptoms: no abdominal pain, no dizziness, no fever, no headache, no nausea, no vomiting and no weakness     Past Medical History:  Diagnosis Date  . AAA (abdominal aortic aneurysm) ([place])   . Abnormal LFTs   . AKI (acute kidney injury) ([place])   . Anemia of chronic disease   . Arthritis    "all over" (12/04/2015)  . Atherosclerosis of native coronary artery of native heart without angina pectoris   . CAD (coronary artery disease)    with cath 09/26/08 showing severe heart disease in RCA with POBA, mild to mod diesease in circ and LAD  . Chronic back pain    back problems prior to surgery   . Constipation   . Coronary  atherosclerosis of native coronary artery   . Dehydration   . Diabetes mellitus    with neuropathy  . Diarrhea   . Elevated liver enzymes   . Generalized weakness   . Gout of right hand   . Hiatal hernia   . History of gastroesophageal reflux (GERD)   . History of stomach ulcers   . Hyperlipidemia   . Hypertension   . Metabolic acidosis   . Normochromic anemia 02/23/2016  . 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 Thrombocytopenia ([place])   . Unintentional weight loss 09/27/2017   33 lbs between 4/19 & 8/19  . Vitamin B12 deficiency   . Vitamin B12 deficiency   . Vitamin D deficiency   . Weakness     Patient Active Problem List   Diagnosis Date Noted  . 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 Unintentional weight loss 09/27/2017  . Cataract of both eyes 09/19/2017  . 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 Hyperlipidemia associated with type 2 diabetes mellitus ([place]) 01/21/2017  . Type 2 diabetes mellitus with diabetic neuropathy, without long-term current use of insulin ([place]) 01/21/2017  . Diabetic nephropathy associated with type 2 diabetes mellitus ([place]) 01/21/2017  . 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 DVT of deep femoral vein, right ([place]) 12/06/2015  .  Thrombocytopenia due to drugs   . Allergy status to unspecified drugs, medicaments and biological substances status   . AAA (abdominal aortic aneurysm) ([name])-infrarenal 3.4 cm 12/04/2015  . Vitamin B12 deficiency 04/22/2015  . Controlled diabetes mellitus type 2 with complications ([place]) 81/82/9937  . Anemia of chronic disease 04/18/2015  . Hypertension complicating diabetes ([place]) 07/22/2010  . Coronary atherosclerosis of native coronary artery 09/17/2008    Past Surgical History:  Procedure Laterality Date  . ANKLE FRACTURE SURGERY Left    "crushed"  . BACK SURGERY  X 3   "fell 4 stories in 1977"  . CARPAL TUNNEL RELEASE Left   . CORONARY ANGIOPLASTY WITH STENT PLACEMENT  09/26/2008    [name] 06/03/2010  . FRACTURE SURGERY    . LEFT HEART CATHETERIZATION WITH CORONARY ANGIOGRAM N/A 01/21/2012   Procedure: LEFT HEART CATHETERIZATION WITH CORONARY ANGIOGRAM;  Surgeon: Burnell Blanks, MD;  Location: [facility] CATH LAB;  Service: Cardiovascular;  Laterality: N/A;  . LUMBAR [place]; ~ 1980  . POSTERIOR LUMBAR FUSION  ~ 1998   "3rd OR to reconstruct my back after fall in 1977"  . 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 and 2nd        Home Medications    Prior to Admission medications   Medication Sig Start Date End Date Taking? Authorizing Provider  aspirin 81 MG chewable tablet Chew 81 mg by mouth daily.    [provider]  clopidogrel (PLAVIX) 75 MG tablet TAKE 1 TABLET BY MOUTH  DAILY 06/14/18   Denita Lung, MD  colchicine 0.6 MG tablet TAKE 1 TABLET BY MOUTH  DAILY 06/14/18   Denita Lung, MD  febuxostat (ULORIC) 40 MG tablet Take 1 tablet (40 mg total) by mouth daily. Patient taking differently: Take 80 mg by mouth daily.  05/17/17   Denita Lung, MD  fish oil-omega-3 fatty acids 1000 MG capsule Take 1 g by mouth 2 (two) times daily.     [provider]  glucose blood (ACCU-CHEK AVIVA PLUS) test strip Test once to twice daily 02/06/18   Denita Lung, MD  isosorbide mononitrate (IMDUR) 30 MG 24 hr tablet TAKE 1 TABLET BY MOUTH  TWICE A DAY 12/26/17   Burnell Blanks, MD  metoprolol tartrate (LOPRESSOR) 25 MG tablet TAKE 1 TABLET BY MOUTH 2  TIMES DAILY 12/26/17   Burnell Blanks, MD  Multiple Vitamins-Minerals (CENTRUM) tablet Take 1 tablet by mouth daily.    [provider]  nitroGLYCERIN (NITROSTAT) 0.4 MG SL tablet Place 0.4 mg under the tongue every 5 (five) minutes as needed for chest pain. For 3 doses 10/12/11   Denita Lung, MD  pyridoxine (B-6) 100 MG tablet Take 100 mg by mouth daily.    [provider]  simvastatin (ZOCOR) 20 MG tablet TAKE 1 TABLET BY MOUTH AT  BEDTIME 04/05/18   Burnell Blanks, MD  vitamin B-12 (CYANOCOBALAMIN) 1000 MCG tablet Take 1 tablet (1,000 mcg total) by mouth daily. 05/09/15   Denita Lung, MD    Family History Family History  Problem Relation Age of Onset  . Heart attack Mother   . Cancer Mother   . Heart disease Mother   . Heart attack Sister   . Heart disease Sister     Social History Social History   Tobacco Use  . Smoking status: Current Every Day Smoker    Packs/day: 0.50    Years: 20.00    Pack  years: 10.00    Types: Cigars    Last attempt to quit: 01/24/2013    Years since quitting: 5.6  . Smokeless tobacco: Never Used  Substance Use Topics  . Alcohol use: Yes    Comment: "Moonshine" lately. moderate drinker for 20 years   . Drug use: No     Allergies   Allopurinol and Metformin and related   Review of Systems Review of Systems  Constitutional: Negative for activity change, appetite change and fever.  HENT: Negative for congestion and rhinorrhea.   Eyes: Negative for visual disturbance.  Respiratory: Positive for chest tightness and shortness of breath.   Cardiovascular: Positive for chest pain.  Gastrointestinal: Negative for abdominal pain, nausea and vomiting.  Genitourinary: Negative for dysuria and hematuria.  Musculoskeletal: Negative for arthralgias and myalgias.  Skin: Negative for rash.  Neurological: Negative for dizziness, weakness and headaches.   all other systems are negative except as noted in the HPI and PMH.     Physical Exam Updated Vital Signs BP (!) 158/36   Pulse 65   Temp 98.1 F (36.7 C) (Oral)   Resp (!) 21   SpO2 98%   Physical Exam Vitals signs and nursing note reviewed.  Constitutional:      General: He is not in acute distress.    Appearance: He is well-developed.  HENT:     Head: Normocephalic and atraumatic.     Mouth/Throat:     Pharynx: No oropharyngeal exudate.  Eyes:     Conjunctiva/sclera: Conjunctivae normal.     Pupils: Pupils are equal, round, and reactive to  light.  Neck:     Musculoskeletal: Normal range of motion and neck supple.     Comments: No meningismus. Cardiovascular:     Rate and Rhythm: Normal rate and regular rhythm.     Heart sounds: Normal heart sounds. No murmur.  Pulmonary:     Effort: Pulmonary effort is normal. No respiratory distress.     Breath sounds: Normal breath sounds.  Abdominal:     Palpations: Abdomen is soft.     Tenderness: There is no abdominal tenderness. There is no guarding or rebound.  Musculoskeletal: Normal range of motion.        General: No tenderness.  Skin:    General: Skin is warm.  Neurological:     Mental Status: He is alert and oriented to person, place, and time.     Cranial Nerves: No cranial nerve deficit.     Motor: No abnormal muscle tone.     Coordination: Coordination normal.     Comments: No ataxia on finger to nose bilaterally. No pronator drift. 5/5 strength throughout. CN 2-12 intact.Equal grip strength. Sensation intact.   Psychiatric:        Behavior: Behavior normal.      ED Treatments / Results  Labs (all labs ordered are listed, but only abnormal results are displayed) Labs Reviewed  CBC WITH DIFFERENTIAL/PLATELET - Abnormal; Notable for the following components:      Result Value   WBC 11.3 (*)    RBC 2.91 (*)    Hemoglobin 8.9 (*)    HCT 26.0 (*)    RDW 22.3 (*)    Basophils Absolute 0.2 (*)    Abs Immature Granulocytes 0.19 (*)    All other components within normal limits  COMPREHENSIVE METABOLIC PANEL - Abnormal; Notable for the following components:   CO2 21 (*)    Glucose, Bld 154 (*)    Calcium 8.8 (*)  Albumin 2.7 (*)    All other components within normal limits  CBC - Abnormal; Notable for the following components:   WBC 11.7 (*)    RBC 2.85 (*)    Hemoglobin 8.6 (*)    HCT 25.4 (*)    RDW 22.5 (*)    All other components within normal limits  CREATININE, SERUM - Abnormal; Notable for the following components:   GFR calc non Af Amer 56 (*)     All other components within normal limits  SARS CORONAVIRUS 2  LIPASE, BLOOD  HEMOGLOBIN A1C  TROPONIN I (HIGH SENSITIVITY)  TROPONIN I (HIGH SENSITIVITY)    EKG EKG Interpretation  Date/Time:  Saturday September 02 2018 02:05:14 EDT Ventricular Rate:  71 PR Interval:    QRS Duration: 100 QT Interval:  411 QTC Calculation: 447 R Axis:   28 Text Interpretation:  Sinus rhythm No significant change was found Confirmed by Ezequiel Essex [phone]) on 09/02/2018 2:38:22 AM   Radiology Dg Chest Portable 1 View  Result Date: 09/02/2018 CLINICAL DATA:  Chest pain EXAM: PORTABLE CHEST 1 VIEW COMPARISON:  September 27, 2017 FINDINGS: The heart size is mildly enlarged. No pneumothorax. No large pleural effusion. 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 There is no acute osseous abnormality. Aortic calcifications are noted. IMPRESSION: No active disease. Electronically Signed   By: Constance Holster M.D.   On: 09/02/2018 03:12    Procedures Procedures (including critical care time)  Medications Ordered in ED Medications - No data to display   Initial Impression / Assessment and Plan / ED Course  I have reviewed the triage vital signs and the nursing notes.  Pertinent labs & imaging results that were available during my care of the patient were reviewed by me and considered in my medical decision making (see chart for details).      Known CAD with episode of chest pain or shortness of breath that is since resolved after nitroglycerin.  EKG shows sinus rhythm without acute ST changes.  Patient remains chest pain-free.  Cardiac cath in December 2013 in the setting of worsened chest pain/dsyspnea showed total occlusion of the RCA which was not amenable to PCI (PCI attempted in 2009). The LAD and Circumflex had moderate disease  Troponin negative.  Chest x-ray negative.  Patient remains chest pain-free and feels at baseline. Labs with stable anemia. Concern for angina. Low suspicion for pulmonary  embolism or aortic dissection.  Discussed with cardiology fellow Dr. Georgette Shell who agrees there is some concern for angina agrees with overnight observation on the hospitalist service.  No need for heparin unless enzymes become positive.  Patient remains chest pain free. Admission d/w Dr. Jonelle Sidle.   CRITICAL CARE Performed by: Ezequiel Essex Total critical care time: 32 minutes Critical care time was exclusive of separately billable procedures and treating other patients. Critical care was necessary to treat or prevent imminent or life-threatening deterioration. Critical care was time spent personally by me on the following activities: development of treatment plan with patient and/or surrogate as well as nursing, discussions with consultants, evaluation of patient's response to treatment, examination of patient, obtaining history from patient or surrogate, ordering and performing treatments and interventions, ordering and review of laboratory studies, ordering and review of radiographic studies, pulse oximetry and re-evaluation of patient's condition.    Final Clinical Impressions(s) / ED Diagnoses   Final diagnoses:  Unstable angina [facility])    ED Discharge Orders    None       Arleny Kruger, Annie Main, MD  09/02/18 0715  

## 2018-09-02 NOTE — Progress Notes (Signed)
Echocardiogram 2D Echocardiogram has been performed.  Oneal Deputy Jaquitta Dupriest 09/02/2018, 2:19 PM

## 2018-09-02 NOTE — ED Triage Notes (Signed)
Pt brought in by [facility] from home for centralized CP starting approx 45min ago. Pt self-administered 324mg  ASA, pt given x1 nitro, states pain is now 0/10. Pt has hx of cardiac stents.

## 2018-09-02 NOTE — ED Notes (Addendum)
ED TO INPATIENT HANDOFF REPORT  ED Nurse Name and Phone #: Thurmond Butts [place] Name/Age/Gender Paul [name] 78 y.o. male Room/Bed: 036C/036C  Code Status   Code Status: Full Code  Home/SNF/Other Home Patient oriented to: self, place, time and situation Is this baseline? Yes   Triage Complete: Triage complete  Chief Complaint CP  Triage Note Pt brought in by [facility] from home for centralized CP starting approx 17min ago. Pt self-administered 324mg  ASA, pt given x1 nitro, states pain is now 0/10. Pt has hx of cardiac stents.    Allergies Allergies  Allergen Reactions  . Allopurinol Other (See Comments)    thrombocytopenia  . Metformin And Related Diarrhea    Level of Care/Admitting Diagnosis ED Disposition    ED Disposition Condition [facility]: [place] [[id number]]  Level of Care: Telemetry Cardiac [103]  I expect the patient will be discharged within 24 hours: No (not a candidate for 5C-Observation unit)  Covid Evaluation: Asymptomatic Screening Protocol (No Symptoms)  Diagnosis: Chest pain [937169]  Admitting Physician: Elwyn Reach [[id number]]  Attending Physician: Elwyn Reach [[id number]]  PT Class (Do Not Modify): Observation [104]  PT Acc Code (Do Not Modify): Observation [10022]       B Medical/Surgery History Past Medical History:  Diagnosis Date  . AAA (abdominal aortic aneurysm) ([place])   . Abnormal LFTs   . AKI (acute kidney injury) ([place])   . Anemia of chronic disease   . Arthritis    "all over" (12/04/2015)  . Atherosclerosis of native coronary artery of native heart without angina pectoris   . CAD (coronary artery disease)    with cath 09/26/08 showing severe heart disease in RCA with POBA, mild to mod diesease in circ and LAD  . Chronic back pain    back problems prior to surgery   . Constipation   . Coronary atherosclerosis of native coronary artery   . Dehydration   . Diabetes mellitus    with  neuropathy  . Diarrhea   . Elevated liver enzymes   . Generalized weakness   . Gout of right hand   . Hiatal hernia   . History of gastroesophageal reflux (GERD)   . History of stomach ulcers   . Hyperlipidemia   . Hypertension   . Metabolic acidosis   . Normochromic anemia 02/23/2016  . 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 Thrombocytopenia ([place])   . Unintentional weight loss 09/27/2017   33 lbs between 4/19 & 8/19  . Vitamin B12 deficiency   . Vitamin B12 deficiency   . Vitamin D deficiency   . Weakness    Past Surgical History:  Procedure Laterality Date  . ANKLE FRACTURE SURGERY Left    "crushed"  . BACK SURGERY  X 3   "fell 4 stories in 1977"  . CARPAL TUNNEL RELEASE Left   . CORONARY ANGIOPLASTY WITH STENT PLACEMENT  09/26/2008   [name] 06/03/2010  . FRACTURE SURGERY    . LEFT HEART CATHETERIZATION WITH CORONARY ANGIOGRAM N/A 01/21/2012   Procedure: LEFT HEART CATHETERIZATION WITH CORONARY ANGIOGRAM;  Surgeon: Burnell Blanks, MD;  Location: [facility] CATH LAB;  Service: Cardiovascular;  Laterality: N/A;  . LUMBAR [place]; ~ 1980  . POSTERIOR LUMBAR FUSION  ~ 1998   "3rd OR to reconstruct my back after fall in 1977"  . 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 and 2nd     A IV Location/Drains/Wounds Patient Lines/Drains/Airways  Status   Active Line/Drains/Airways    Name:   Placement date:   Placement time:   Site:   Days:   Peripheral IV 09/02/18 Left Antecubital   09/02/18    -    Antecubital   less than 1          Intake/Output Last 24 hours No intake or output data in the 24 hours ending 09/02/18 1324  Labs/Imaging Results for orders placed or performed during the hospital encounter of 09/02/18 (from the past 48 hour(s))  CBC with Differential/Platelet     Status: Abnormal   Collection Time: 09/02/18  2:33 AM  Result Value Ref Range   WBC 11.3 (H) 4.0 - 10.5 K/uL   RBC 2.91 (L) 4.22 - 5.81 MIL/uL   Hemoglobin 8.9 (L) 13.0 - 17.0 g/dL   HCT 26.0 (L) 39.0 -  52.0 %   MCV 89.3 80.0 - 100.0 fL   MCH 30.6 26.0 - 34.0 pg   MCHC 34.2 30.0 - 36.0 g/dL   RDW 22.3 (H) 11.5 - 15.5 %   Platelets 295 150 - 400 K/uL   nRBC 0.2 0.0 - 0.2 %   Neutrophils Relative % 66 %   Neutro Abs 7.4 1.7 - 7.7 K/uL   Lymphocytes Relative 25 %   Lymphs Abs 2.9 0.7 - 4.0 K/uL   Monocytes Relative 2 %   Monocytes Absolute 0.3 0.1 - 1.0 K/uL   Eosinophils Relative 3 %   Eosinophils Absolute 0.3 0.0 - 0.5 K/uL   Basophils Relative 2 %   Basophils Absolute 0.2 (H) 0.0 - 0.1 K/uL   Immature Granulocytes 2 %   Abs Immature Granulocytes 0.19 (H) 0.00 - 0.07 K/uL   Acanthocytes PRESENT    Schistocytes PRESENT    Basophilic Stippling PRESENT     Comment: Performed at [facility], [address], [place], [place] [address]  Comprehensive metabolic panel     Status: Abnormal   Collection Time: 09/02/18  2:33 AM  Result Value Ref Range   Sodium 136 135 - 145 mmol/L   Potassium 3.8 3.5 - 5.1 mmol/L   Chloride 108 98 - 111 mmol/L   CO2 21 (L) 22 - 32 mmol/L   Glucose, Bld 154 (H) 70 - 99 mg/dL   BUN 21 8 - 23 mg/dL   Creatinine, Ser 1.14 0.61 - 1.24 mg/dL   Calcium 8.8 (L) 8.9 - 10.3 mg/dL   Total Protein 7.1 6.5 - 8.1 g/dL   Albumin 2.7 (L) 3.5 - 5.0 g/dL   AST 29 15 - 41 U/L   ALT 21 0 - 44 U/L   Alkaline Phosphatase 71 38 - 126 U/L   Total Bilirubin 0.9 0.3 - 1.2 mg/dL   GFR calc non Af Amer >60 >60 mL/min   GFR calc Af Amer >60 >60 mL/min   Anion gap 7 5 - 15    Comment: Performed at [facility] [address] [place], [place] [address]  Lipase, blood     Status: None   Collection Time: 09/02/18  2:33 AM  Result Value Ref Range   Lipase 40 11 - 51 U/L    Comment: Performed at [facility], [place] [address] [place], [place] [address]  Troponin I (High Sensitivity)     Status: None   Collection Time: 09/02/18  2:33 AM  Result Value Ref Range   Troponin I (High Sensitivity) 6 <18 ng/L    Comment: (NOTE) Elevated  high sensitivity troponin  I (hsTnI) values and significant  changes across serial measurements may suggest ACS but many other  chronic and acute conditions are known to elevate hsTnI results.  Refer to the "Links" section for chest pain algorithms and additional  guidance. Performed at [facility], [place] [address] [place], [place] [address]   SARS CORONAVIRUS 2 Nasal Swab Aptima Multi Swab     Status: None   Collection Time: 09/02/18  3:52 AM   Specimen: Aptima Multi Swab; Nasal Swab  Result Value Ref Range   SARS Coronavirus 2 NEGATIVE NEGATIVE    Comment: (NOTE) SARS-CoV-2 target nucleic acids are NOT DETECTED. The SARS-CoV-2 RNA is generally detectable in upper and lower respiratory specimens during the acute phase of infection. Negative results do not preclude SARS-CoV-2 infection, do not rule out co-infections with other pathogens, and should not be used as the sole basis for treatment or other patient management decisions. Negative results must be combined with clinical observations, patient history, and epidemiological information. The expected result is Negative. Fact Sheet for Patients: SugarRoll.be Fact Sheet for Healthcare Providers: https://www.woods-mathews.com/ This test is not yet approved or cleared by the [place] FDA and  has been authorized for detection and/or diagnosis of SARS-CoV-2 by FDA under an Emergency Use Authorization (EUA). This EUA will remain  in effect (meaning this test can be used) for the duration of the COVID-19 declaration under Section 56 4(b)(1) of the Act, 21 U.S.C. section 360bbb-3(b)(1), unless the authorization is terminated or revoked sooner. Performed at [facility], [place] [address], [place], [place] [address]   Troponin I (High Sensitivity)     Status: None   Collection Time: 09/02/18  4:16 AM  Result Value Ref Range   Troponin I (High Sensitivity) 7 <18 ng/L    Comment: (NOTE) Elevated high  sensitivity troponin I (hsTnI) values and significant  changes across serial measurements may suggest ACS but many other  chronic and acute conditions are known to elevate hsTnI results.  Refer to the "Links" section for chest pain algorithms and additional  guidance. Performed at [facility], [place] [address], [place], [place] [address]   CBC     Status: Abnormal   Collection Time: 09/02/18  5:06 AM  Result Value Ref Range   WBC 11.7 (H) 4.0 - 10.5 K/uL   RBC 2.85 (L) 4.22 - 5.81 MIL/uL   Hemoglobin 8.6 (L) 13.0 - 17.0 g/dL   HCT 25.4 (L) 39.0 - 52.0 %   MCV 89.1 80.0 - 100.0 fL   MCH 30.2 26.0 - 34.0 pg   MCHC 33.9 30.0 - 36.0 g/dL   RDW 22.5 (H) 11.5 - 15.5 %   Platelets 260 150 - 400 K/uL   nRBC 0.2 0.0 - 0.2 %    Comment: Performed at [facility], [place] [address], [place], [place] [address]  Creatinine, serum     Status: Abnormal   Collection Time: 09/02/18  5:06 AM  Result Value Ref Range   Creatinine, Ser 1.24 0.61 - 1.24 mg/dL   GFR calc non Af Amer 56 (L) >60 mL/min   GFR calc Af Amer >60 >60 mL/min    Comment: Performed at [facility] [address], [place], [place] [address]  Hemoglobin A1c     Status: None   Collection Time: 09/02/18  5:06 AM  Result Value Ref Range   Hgb A1c MFr Bld 5.2 4.8 - 5.6 %  Comment: (NOTE) Pre diabetes:          5.7%-6.4% Diabetes:              >6.4% Glycemic control for   <7.0% adults with diabetes    Mean Plasma Glucose 102.54 mg/dL    Comment: Performed at [facility] [address], [place], [place] [address]  CBG monitoring, ED     Status: None   Collection Time: 09/02/18  8:20 AM  Result Value Ref Range   Glucose-Capillary 97 70 - 99 mg/dL  Hemoglobin and hematocrit, blood     Status: Abnormal   Collection Time: 09/02/18 11:48 AM  Result Value Ref Range   Hemoglobin 8.9 (L) 13.0 - 17.0 g/dL   HCT 26.2 (L) 39.0 - 52.0 %    Comment: Performed at [facility] [address]  [place], [place] [address]  CBG monitoring, ED     Status: Abnormal   Collection Time: 09/02/18 12:43 PM  Result Value Ref Range   Glucose-Capillary 119 (H) 70 - 99 mg/dL   Dg Chest Portable 1 View  Result Date: 09/02/2018 CLINICAL DATA:  Chest pain EXAM: PORTABLE CHEST 1 VIEW COMPARISON:  September 27, 2017 FINDINGS: The heart size is mildly enlarged. No pneumothorax. No large pleural effusion. 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 There is no acute osseous abnormality. Aortic calcifications are noted. IMPRESSION: No active disease. Electronically Signed   By: Constance Holster M.D.   On: 09/02/2018 03:12    Pending Labs Unresulted Labs (From admission, onward)    Start     Ordered   09/09/18 0500  Creatinine, serum  (enoxaparin (LOVENOX)    CrCl >/= 30 ml/min)  Weekly,   R    Comments: while on enoxaparin therapy    09/02/18 0452          Vitals/Pain Today's Vitals   09/02/18 0807 09/02/18 0830 09/02/18 0845 09/02/18 1247  BP: (!) 152/97 (!) 129/52 (!) 131/53 (!) 146/46  Pulse: (!) 57 63 (!) 55 (!) 54  Resp: 18 18 (!) 21 18  Temp:      TempSrc:      SpO2: 98% 98% 98% 98%  PainSc:        Isolation Precautions No active isolations  Medications Medications  acetaminophen (TYLENOL) tablet 650 mg (has no administration in time range)  ondansetron (ZOFRAN) injection 4 mg (has no administration in time range)  enoxaparin (LOVENOX) injection 40 mg (has no administration in time range)  insulin aspart (novoLOG) injection 0-9 Units (0 Units Subcutaneous Not Given 09/02/18 1245)  insulin aspart (novoLOG) injection 0-5 Units (has no administration in time range)  aspirin chewable tablet 81 mg (has no administration in time range)  isosorbide mononitrate (IMDUR) 24 hr tablet 30 mg (has no administration in time range)  metoprolol tartrate (LOPRESSOR) tablet 25 mg (has no administration in time range)  nitroGLYCERIN (NITROSTAT) SL tablet 0.4 mg (has no administration in time range)   simvastatin (ZOCOR) tablet 20 mg (has no administration in time range)  clopidogrel (PLAVIX) tablet 75 mg (has no administration in time range)    Mobility walks with device Low fall risk   Focused Assessments    R Recommendations: See Admitting Provider Note  Report given to: Fransico Meadow RN  Additional Notes:

## 2018-09-02 NOTE — Progress Notes (Addendum)
78 y.o. male with medical history significant of myelodysplastic syndrome, coronary artery disease status post cath in August 2010 with disease of the RCA and LAD not amendable for PCI,diabetes, hypertension, DVT in 2017 (off xarelto now)abdominal aortic aneurysm admitted this morning for chest pain evaluation : resolved after sl nitro/asa by EMS.Cardiology consulted. EKG WNL,  Tropx2 unremarkable. On asa/plavix/nitrates/b-blockers/statins. 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 Hgb 8.6, watch creatinine 1.1-1.2.   Admitted for observation ( after Midnight).  Cardiology eval pending Echo ordered Repeat Hgb as he states his counts have been >9.5 through out this year. May need 1 unit PRBC if hgb <9 on repeat test given c/o chest pain

## 2018-09-02 NOTE — ED Notes (Signed)
Pt received breakfast tray 

## 2018-09-02 NOTE — Consult Note (Addendum)
Cardiology Consultation:   Patient ID: MABRY TIFT MRN: [id number]; DOB: 01-08-1941  Admit date: 09/02/2018 Date of Consult: 09/02/2018  Primary Care Provider: Denita Lung, MD Primary Cardiologist: Lauree Chandler, MD  Primary Electrophysiologist:  None    Patient Profile:   Paul [name] is a 78 y.o. male with a hx of CAD, DM, carotid artery disease, HTN, HL, and DVT  who is being seen today for the evaluation of chest pain at the request of Dr. Earnest Conroy.  History of Present Illness:   Paul [name] is a 78 yo male with PMH noted above.  He is followed by Dr. Angelena Form as an outpatient.  Underwent cardiac cath in 2009 which showed near total occlusion of the RCA beyond previously placed stent.  It was attempted to undergo PCI but was unable to pass balloon distally therefore area was medically managed.  Echo at that time showed normal LV EDP with mild LVH.  In December 2013 underwent coronary angiogram in the setting of worsening chest pain and dyspnea that showed a total occlusion of the RCA which was not amendable to PCI again.  The LAD and circumflex had moderate disease.  He was found to have a DVT in November 2017 and treated with Xarelto.  He was transitioned off Xarelto and placed back on aspirin and Plavix.  Carotid Dopplers in July 2018 showed stable moderate bilateral stenosis.  He is followed by [name] in the hematology oncology clinic for unintentional weight loss and chronic macrocytic anemia.  His malignancy work-up was negative.  He was last seen in the office on 11/11/2017 and reported doing well.  It was noted again that his RCA is known to be occluded but not felt to be amendable to PCI.  He was continued on aspirin, statin, beta-blocker and Imdur.  He currently lives at home and does all of his ADLs independently.  States he does have episodes where he gets intermittently short of breath walking to the mailbox, but this is been present for the past 5  years.  This improves whenever he sits to rest.  States these episodes are usually high up in his chest and shoulder area when he has them. He is currently taking all of his home medications without any complications.  Yesterday was in his usual state of health until the evening.  Was watching TV, then got up to go to bed.  Got dressed for bed, and when he went to lie down had a sharp pain that started on the right side of his chest around his rib area and radiated across his sternum.  States he has been very concerned over COVID and got nervous.  Symptoms persisted for about 15 minutes, he called 911 and took 3 baby aspirin.  At the time the paramedics arrived his symptoms had resolved.  He was given 1 sublingual nitroglycerin but symptoms have essentially resolved.  States by the time he was on the way to the ER he felt much better.  ED his labs showed stable electrolytes, creatinine 1.1, HsT 6>>7, hemoglobin 8.9.  EKG showed sinus rhythm with no acute ischemic changes.  Chest x-ray was negative.  No recurrence of symptoms while in the ED.  COVID screen is pending.  Cardiology asked to evaluate as he is admitted for ACS rule out.  Heart Pathway Score:     Past Medical History:  Diagnosis Date  . AAA (abdominal aortic aneurysm) ([place])   . Abnormal LFTs   . AKI (acute  kidney injury) ([place])   . Anemia of chronic disease   . Arthritis    "all over" (12/04/2015)  . Atherosclerosis of native coronary artery of native heart without angina pectoris   . CAD (coronary artery disease)    with cath 09/26/08 showing severe heart disease in RCA with POBA, mild to mod diesease in circ and LAD  . Chronic back pain    back problems prior to surgery   . Constipation   . Coronary atherosclerosis of native coronary artery   . Dehydration   . Diabetes mellitus    with neuropathy  . Diarrhea   . Elevated liver enzymes   . Generalized weakness   . Gout of right hand   . Hiatal hernia   . History of  gastroesophageal reflux (GERD)   . History of stomach ulcers   . Hyperlipidemia   . Hypertension   . Metabolic acidosis   . Normochromic anemia 02/23/2016  . 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 Thrombocytopenia ([place])   . Unintentional weight loss 09/27/2017   33 lbs between 4/19 & 8/19  . Vitamin B12 deficiency   . Vitamin B12 deficiency   . Vitamin D deficiency   . Weakness     Past Surgical History:  Procedure Laterality Date  . ANKLE FRACTURE SURGERY Left    "crushed"  . BACK SURGERY  X 3   "fell 4 stories in 1977"  . CARPAL TUNNEL RELEASE Left   . CORONARY ANGIOPLASTY WITH STENT PLACEMENT  09/26/2008   [name] 06/03/2010  . FRACTURE SURGERY    . LEFT HEART CATHETERIZATION WITH CORONARY ANGIOGRAM N/A 01/21/2012   Procedure: LEFT HEART CATHETERIZATION WITH CORONARY ANGIOGRAM;  Surgeon: Burnell Blanks, MD;  Location: [facility] CATH LAB;  Service: Cardiovascular;  Laterality: N/A;  . LUMBAR [place]; ~ 1980  . POSTERIOR LUMBAR FUSION  ~ 1998   "3rd OR to reconstruct my back after fall in 1977"  . 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 and 2nd     Home Medications:  Prior to Admission medications   Medication Sig Start Date End Date Taking? Authorizing Provider  aspirin 81 MG chewable tablet Chew 81 mg by mouth daily.    [provider]  clopidogrel (PLAVIX) 75 MG tablet TAKE 1 TABLET BY MOUTH  DAILY 06/14/18   Denita Lung, MD  colchicine 0.6 MG tablet TAKE 1 TABLET BY MOUTH  DAILY 06/14/18   Denita Lung, MD  febuxostat (ULORIC) 40 MG tablet Take 1 tablet (40 mg total) by mouth daily. Patient taking differently: Take 80 mg by mouth daily.  05/17/17   Denita Lung, MD  fish oil-omega-3 fatty acids 1000 MG capsule Take 1 g by mouth 2 (two) times daily.     [provider]  glucose blood (ACCU-CHEK AVIVA PLUS) test strip Test once to twice daily 02/06/18   Denita Lung, MD  isosorbide mononitrate (IMDUR) 30 MG 24 hr tablet TAKE 1 TABLET BY MOUTH  TWICE A  DAY 12/26/17   Burnell Blanks, MD  metoprolol tartrate (LOPRESSOR) 25 MG tablet TAKE 1 TABLET BY MOUTH 2  TIMES DAILY 12/26/17   Burnell Blanks, MD  Multiple Vitamins-Minerals (CENTRUM) tablet Take 1 tablet by mouth daily.    [provider]  nitroGLYCERIN (NITROSTAT) 0.4 MG SL tablet Place 0.4 mg under the tongue every 5 (five) minutes as needed for chest pain. For 3 doses 10/12/11   Denita Lung, MD  pyridoxine (  B-6) 100 MG tablet Take 100 mg by mouth daily.    [provider]  simvastatin (ZOCOR) 20 MG tablet TAKE 1 TABLET BY MOUTH AT  BEDTIME 04/05/18   Burnell Blanks, MD  vitamin B-12 (CYANOCOBALAMIN) 1000 MCG tablet Take 1 tablet (1,000 mcg total) by mouth daily. 05/09/15   Denita Lung, MD    Inpatient Medications: Scheduled Meds: . enoxaparin (LOVENOX) injection  40 mg Subcutaneous Daily  . insulin aspart  0-5 Units Subcutaneous QHS  . insulin aspart  0-9 Units Subcutaneous TID WC   Continuous Infusions:  PRN Meds: acetaminophen, ondansetron (ZOFRAN) IV  Allergies:    Allergies  Allergen Reactions  . Allopurinol Other (See Comments)    thrombocytopenia  . Metformin And Related Diarrhea    Social History:   Social History   Socioeconomic History  . Marital status: Legally Separated    Spouse name: Not on file  . Number of children: 0  . Years of education: Not on file  . Highest education level: Not on file  Occupational History  . Not on file  Social Needs  . Financial resource strain: Not on file  . Food insecurity    Worry: Not on file    Inability: Not on file  . Transportation needs    Medical: Not on file    Non-medical: Not on file  Tobacco Use  . Smoking status: Current Every Day Smoker    Packs/day: 0.50    Years: 20.00    Pack years: 10.00    Types: Cigars    Last attempt to quit: 01/24/2013    Years since quitting: 5.6  . Smokeless tobacco: Never Used  Substance and Sexual Activity  . Alcohol use:  Yes    Comment: "Moonshine" lately. moderate drinker for 20 years   . Drug use: No  . Sexual activity: Not on file  Lifestyle  . Physical activity    Days per week: Not on file    Minutes per session: Not on file  . Stress: Not on file  Relationships  . Social Herbalist on phone: Not on file    Gets together: Not on file    Attends religious service: Not on file    Active member of club or organization: Not on file    Attends meetings of clubs or organizations: Not on file    Relationship status: Not on file  . Intimate partner violence    Fear of current or ex partner: Not on file    Emotionally abused: Not on file    Physically abused: Not on file    Forced sexual activity: Not on file  Other Topics Concern  . Not on file  Social History Narrative   Divorced, 1 child.    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    Owned roofing co. In [place]    Family History:    Family History  Problem Relation Age of Onset  . Heart attack Mother   . Cancer Mother   . Heart disease Mother   . Heart attack Sister   . Heart disease Sister      ROS:  Please see the history of present illness.   All other ROS reviewed and negative.     Physical Exam/Data:   Vitals:   09/02/18 0700 09/02/18 0807 09/02/18 0830 09/02/18 0845  BP: (!) 140/37 (!) 152/97 (!) 129/52 (!) 131/53  Pulse: 65 (!) 57 63 (!)  55  Resp: 19 18 18  (!) 21  Temp:      TempSrc:      SpO2: 99% 98% 98% 98%   No intake or output data in the 24 hours ending 09/02/18 0936 Last 3 Weights 04/19/2018 01/02/2018 12/14/2017  Weight (lbs) 191 lb 194 lb 14.4 oz 191 lb 6.4 oz  Weight (kg) 86.637 kg 88.406 kg 86.818 kg     There is no height or weight on file to calculate BMI.  General:  Well nourished, well developed, in no acute distress HEENT: normal Lymph: no adenopathy Neck: no JVD Endocrine:  No thryomegaly Vascular: No carotid bruits Cardiac:  normal S1, S2; RRR; + systolic  murmur  Lungs:  clear to auscultation bilaterally, no wheezing, rhonchi or rales  Abd: soft, nontender, no hepatomegaly  Ext: no edema Musculoskeletal:  No deformities, BUE and BLE strength normal and equal Skin: warm and dry  Neuro:  CNs 2-12 intact, no focal abnormalities noted Psych:  Normal affect   EKG:  The EKG was personally reviewed and demonstrates: SR without ischemic changes  Relevant CV Studies:  TTE: 12/05/15  Study Conclusions  - Left ventricle: The cavity size was mildly dilated. Systolic   function was normal. The estimated ejection fraction was in the   range of 60% to 65%. Wall motion was normal; there were no   regional wall motion abnormalities. There was an increased   relative contribution of atrial contraction to ventricular   filling. Doppler parameters are consistent with abnormal left   ventricular relaxation (grade 1 diastolic dysfunction). - Aortic valve: Trileaflet; normal thickness, mildly calcified   leaflets. There was moderate regurgitation. - Left atrium: The atrium was moderately dilated. - Atrial septum: There was increased thickness of the septum,   consistent with lipomatous hypertrophy. - Pulmonary arteries: PA peak pressure: 36 mm Hg (S). - Impressions: The right ventricular systolic pressure was   increased consistent with mild pulmonary hypertension.  Impressions:  - The right ventricular systolic pressure was increased consistent   with mild pulmonary hypertension.  Laboratory Data:  High Sensitivity Troponin:   Recent Labs  Lab 09/02/18 0233 09/02/18 0416  TROPONINIHS 6 7     Cardiac EnzymesNo results for input(s): TROPONINI in the last 168 hours. No results for input(s): TROPIPOC in the last 168 hours.  Chemistry Recent Labs  Lab 09/02/18 0233 09/02/18 0506  NA 136  --   K 3.8  --   CL 108  --   CO2 21*  --   GLUCOSE 154*  --   BUN 21  --   CREATININE 1.14 1.24  CALCIUM 8.8*  --   GFRNONAA >60 56*  GFRAA >60  >60  ANIONGAP 7  --     Recent Labs  Lab 09/02/18 0233  PROT 7.1  ALBUMIN 2.7*  AST 29  ALT 21  ALKPHOS 71  BILITOT 0.9   Hematology Recent Labs  Lab 09/02/18 0233 09/02/18 0506  WBC 11.3* 11.7*  RBC 2.91* 2.85*  HGB 8.9* 8.6*  HCT 26.0* 25.4*  MCV 89.3 89.1  MCH 30.6 30.2  MCHC 34.2 33.9  RDW 22.3* 22.5*  PLT 295 260   BNPNo results for input(s): BNP, PROBNP in the last 168 hours.  DDimer No results for input(s): DDIMER in the last 168 hours.   Radiology/Studies:  Dg Chest Portable 1 View  Result Date: 09/02/2018 CLINICAL DATA:  Chest pain EXAM: PORTABLE CHEST 1 VIEW COMPARISON:  September 27, 2017 FINDINGS: The heart  size is mildly enlarged. No pneumothorax. No large pleural effusion. 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 There is no acute osseous abnormality. Aortic calcifications are noted. IMPRESSION: No active disease. Electronically Signed   By: Constance Holster M.D.   On: 09/02/2018 03:12    Assessment and Plan:   Paul [name] is a 78 y.o. male with a hx of CAD, DM, carotid artery disease, HTN, HL, and DVT  who is being seen today for the evaluation of chest pain at the request of Dr. Earnest Conroy.  1. Chest pain: Symptoms are different from his prior angina. Essentially resolved prior to receiving SL nitro. Has been very concerned about COVID and feels that he may have overreacted with his symptoms. EKG without ischemia. HsT 6>>7. Pain free since prior to arrival. Does have known CAD. At this time, symptoms do not appear to be consistent with ACS.  -- echo pending, if no acute change would not anticipate further work up. Consider outpatient stress testing.   2. CAD: known CTO of the RCA, with moderate disease in the LAD, LCx at last cath back in 2013. Has been compliant with ASA, plavix. Can consider increasing Imdur.   3. HTN: blood pressures were elevated on admission. Improved at the time of assessment  4. HL: on statin  For questions or updates, please  contact [facility] Please consult [email or web address] for contact info under   Signed, Reino Bellis, NP  09/02/2018 9:36 AM   The patient was seen and examined, and I agree with the history, physical exam, assessment and plan as documented above, with modifications as noted below. I have also personally reviewed all relevant documentation, old records, labs, and both radiographic and cardiovascular studies. I have also independently interpreted old and new ECG's.  Briefly, this is a 78 year old male with history of coronary disease with specific details reviewed above including total occlusion of the RCA not amenable to PCI.    He also has a myeloproliferative disorder with dysplastic features with most recent hemoglobin of 8.6 earlier this morning.  He had eaten two [place] burgers yesterday evening and then went to bed around midnight.  He had some mild shortness of breath and some right-sided chest discomfort.  He tried rolling around in the bed and then became very worried about COVID and called 911.  He was instructed to take 4 baby aspirin which he did and then received sublingual nitroglycerin when EMS arrived.  Since that time his symptoms have never recurred.  He is hemodynamically stable.  Chest x-ray is normal.  ECG is without acute ischemic abnormalities.  An echocardiogram has been ordered by internal medicine.  Troponins are normal.  His symptoms may be due to gradually declining hemoglobin and he may require packed red blood cell transfusion.  Apart from this, I do not feel he needs any further cardiac work-up.  Continue aspirin, Plavix, metoprolol, Imdur, and simvastatin.   Kate Sable, MD, [facility]  09/02/2018 10:52 AM   Echo shows normal LV systolic function.  [facility] will sign off.   Medication Recommendations:  As above  Other recommendations (labs, testing, etc):  NA Follow up as an outpatient:  Follow up with Dr. Angelena Form as previously  scheduled

## 2018-09-02 NOTE — ED Notes (Signed)
Lunch tray ordered for pt.

## 2018-09-02 NOTE — H&P (Signed)
History and Physical   Paul [name] PPJ:[id number] DOB: [birth year] DOA: 09/02/2018  Referring MD/NP/PA: Dr Wyvonnia Dusky  PCP: Denita Lung, MD   Outpatient Specialists: Dr. Burr Medico, oncology  Patient coming from: Home  Chief Complaint: Chest pain  HPI: Paul [name] is a 78 y.o. male with medical history significant of myelodysplastic syndrome, coronary artery disease status post cath in August 2010 with disease of the RCA and LAD.  No further cardiac work-up since then, diabetes, hypertension, abdominal aortic aneurysm who presents with central chest pain rated as 6 out of 10.  Pain is pressure-like radiating to both right and left shoulders.  It was worsened when he was working from recliner to his bed.  Associated with shortness of breath.  Not relieved by initial rest.  Patient called EMS and received aspirin and nitroglycerin and the paste has been relieved.  Symptoms lasted between 30 minutes to an hour.  He denied any nausea vomiting or diaphoresis.  Due to patient's known history his being admitted for rule out MI.  Cardiology has been consulted by the ER..  ED Course: Temperature is 98.1 blood pressure 173/57 pulse 73 respirate of 23 oxygen sats 98% on room air.  White count 11.3 hemoglobin 8.9 and platelet 295.  Sodium is 136 potassium 3.8 chloride 108 CO2 to of 21 BUN 20 creatinine 1.14 and calcium 8.8.  Glucose is 154.  Chest x-ray showed no active findings.  COVID-19 testing is pending.  Initial cardiac enzymes negative.  Patient is being admitted for rule out MI.  Cardiologist has been consulted by ER and will follow with the patient.  Review of Systems: As per HPI otherwise 10 point review of systems negative.    Past Medical History:  Diagnosis Date  . AAA (abdominal aortic aneurysm) ([place])   . Abnormal LFTs   . AKI (acute kidney injury) ([place])   . Anemia of chronic disease   . Arthritis    "all over" (12/04/2015)  . Atherosclerosis of native coronary artery of native  heart without angina pectoris   . CAD (coronary artery disease)    with cath 09/26/08 showing severe heart disease in RCA with POBA, mild to mod diesease in circ and LAD  . Chronic back pain    back problems prior to surgery   . Constipation   . Coronary atherosclerosis of native coronary artery   . Dehydration   . Diabetes mellitus    with neuropathy  . Diarrhea   . Elevated liver enzymes   . Generalized weakness   . Gout of right hand   . Hiatal hernia   . History of gastroesophageal reflux (GERD)   . History of stomach ulcers   . Hyperlipidemia   . Hypertension   . Metabolic acidosis   . Normochromic anemia 02/23/2016  . 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 Thrombocytopenia ([place])   . Unintentional weight loss 09/27/2017   33 lbs between 4/19 & 8/19  . Vitamin B12 deficiency   . Vitamin B12 deficiency   . Vitamin D deficiency   . Weakness     Past Surgical History:  Procedure Laterality Date  . ANKLE FRACTURE SURGERY Left    "crushed"  . BACK SURGERY  X 3   "fell 4 stories in 1977"  . CARPAL TUNNEL RELEASE Left   . CORONARY ANGIOPLASTY WITH STENT PLACEMENT  09/26/2008   [name] 06/03/2010  . FRACTURE SURGERY    . LEFT HEART CATHETERIZATION WITH CORONARY ANGIOGRAM N/A 01/21/2012   Procedure:  LEFT HEART CATHETERIZATION WITH CORONARY ANGIOGRAM;  Surgeon: Burnell Blanks, MD;  Location: [facility] CATH LAB;  Service: Cardiovascular;  Laterality: N/A;  . LUMBAR Secor; ~ 1980  . POSTERIOR LUMBAR FUSION  ~ 1998   "3rd OR to reconstruct my back after fall in 1977"  . 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 He has a 10.00 pack-year smoking history. He has never used smokeless tobacco. He reports current alcohol use. He reports that he does not use drugs.  Allergies  Allergen Reactions  . Allopurinol Other (See Comments)    thrombocytopenia  . Metformin And Related Diarrhea    Family History  Problem Relation Age of  Onset  . Heart attack Mother   . Cancer Mother   . Heart disease Mother   . Heart attack Sister   . Heart disease Sister      Prior to Admission medications   Medication Sig Start Date End Date Taking? Authorizing Provider  aspirin 81 MG chewable tablet Chew 81 mg by mouth daily.    [provider]  clopidogrel (PLAVIX) 75 MG tablet TAKE 1 TABLET BY MOUTH  DAILY 06/14/18   Denita Lung, MD  colchicine 0.6 MG tablet TAKE 1 TABLET BY MOUTH  DAILY 06/14/18   Denita Lung, MD  febuxostat (ULORIC) 40 MG tablet Take 1 tablet (40 mg total) by mouth daily. Patient taking differently: Take 80 mg by mouth daily.  05/17/17   Denita Lung, MD  fish oil-omega-3 fatty acids 1000 MG capsule Take 1 g by mouth 2 (two) times daily.     [provider]  glucose blood (ACCU-CHEK AVIVA PLUS) test strip Test once to twice daily 02/06/18   Denita Lung, MD  isosorbide mononitrate (IMDUR) 30 MG 24 hr tablet TAKE 1 TABLET BY MOUTH  TWICE A DAY 12/26/17   Burnell Blanks, MD  metoprolol tartrate (LOPRESSOR) 25 MG tablet TAKE 1 TABLET BY MOUTH 2  TIMES DAILY 12/26/17   Burnell Blanks, MD  Multiple Vitamins-Minerals (CENTRUM) tablet Take 1 tablet by mouth daily.    [provider]  nitroGLYCERIN (NITROSTAT) 0.4 MG SL tablet Place 0.4 mg under the tongue every 5 (five) minutes as needed for chest pain. For 3 doses 10/12/11   Denita Lung, MD  pyridoxine (B-6) 100 MG tablet Take 100 mg by mouth daily.    [provider]  simvastatin (ZOCOR) 20 MG tablet TAKE 1 TABLET BY MOUTH AT  BEDTIME 04/05/18   Burnell Blanks, MD  vitamin B-12 (CYANOCOBALAMIN) 1000 MCG tablet Take 1 tablet (1,000 mcg total) by mouth daily. 05/09/15   Denita Lung, MD    Physical Exam: Vitals:   09/02/18 0300 09/02/18 0315 09/02/18 0345 09/02/18 0400  BP: (!) 156/56 (!) 149/42 (!) 150/44 (!) 155/50  Pulse: 64 64  64  Resp: 20 (!) 23 (!) 23 (!) 21  Temp:      TempSrc:       SpO2: 98% 98%  98%      Constitutional: NAD, calm, comfortable Vitals:   09/02/18 0300 09/02/18 0315 09/02/18 0345 09/02/18 0400  BP: (!) 156/56 (!) 149/42 (!) 150/44 (!) 155/50  Pulse: 64 64  64  Resp: 20 (!) 23 (!) 23 (!) 21  Temp:      TempSrc:      SpO2: 98% 98%  98%   Eyes: PERRL,  lids and conjunctivae normal ENMT: Mucous membranes are moist. Posterior pharynx clear of any exudate or lesions.Normal dentition.  Neck: normal, supple, no masses, no thyromegaly Respiratory: clear to auscultation bilaterally, no wheezing, no crackles. Normal respiratory effort. No accessory muscle use.  Cardiovascular: Regular rate and rhythm, no murmurs / rubs / gallops. No extremity edema. 2+ pedal pulses. No carotid bruits.  Abdomen: no tenderness, no masses palpated. No hepatosplenomegaly. Bowel sounds positive.  Musculoskeletal: no clubbing / cyanosis. No joint deformity upper and lower extremities. Good ROM, no contractures. Normal muscle tone.  Skin: no rashes, lesions, ulcers. No induration Neurologic: CN 2-12 grossly intact. Sensation intact, DTR normal. Strength 5/5 in all 4.  Psychiatric: Normal judgment and insight. Alert and oriented x 3. Normal mood.     Labs on Admission: I have personally reviewed following labs and imaging studies  CBC: Recent Labs  Lab 09/02/18 0233  WBC 11.3*  NEUTROABS 7.4  HGB 8.9*  HCT 26.0*  MCV 89.3  PLT 952   Basic Metabolic Panel: Recent Labs  Lab 09/02/18 0233  NA 136  K 3.8  CL 108  CO2 21*  GLUCOSE 154*  BUN 21  CREATININE 1.14  CALCIUM 8.8*   GFR: CrCl cannot be calculated (Unknown ideal weight.). Liver Function Tests: Recent Labs  Lab 09/02/18 0233  AST 29  ALT 21  ALKPHOS 71  BILITOT 0.9  PROT 7.1  ALBUMIN 2.7*   Recent Labs  Lab 09/02/18 0233  LIPASE 40   No results for input(s): AMMONIA in the last 168 hours. Coagulation Profile: No results for input(s): INR, PROTIME in the last 168 hours. Cardiac  Enzymes: No results for input(s): CKTOTAL, CKMB, CKMBINDEX, TROPONINI in the last 168 hours. BNP (last 3 results) No results for input(s): PROBNP in the last 8760 hours. HbA1C: Recent Labs    09/02/18 0506  HGBA1C 5.2   CBG: No results for input(s): GLUCAP in the last 168 hours. Lipid Profile: No results for input(s): CHOL, HDL, LDLCALC, TRIG, CHOLHDL, LDLDIRECT in the last 72 hours. Thyroid Function Tests: No results for input(s): TSH, T4TOTAL, FREET4, T3FREE, THYROIDAB in the last 72 hours. Anemia Panel: No results for input(s): VITAMINB12, FOLATE, FERRITIN, TIBC, IRON, RETICCTPCT in the last 72 hours. Urine analysis:    Component Value Date/Time   COLORURINE AMBER (A) 12/04/2015 1137   APPEARANCEUR Clear 09/27/2017 1520   LABSPEC 1.013 12/04/2015 1137   PHURINE 5.5 12/04/2015 1137   GLUCOSEU Negative 09/27/2017 1520   HGBUR MODERATE (A) 12/04/2015 1137   BILIRUBINUR Negative 09/27/2017 1520   KETONESUR negative 09/19/2017 1017   KETONESUR NEGATIVE 12/04/2015 1137   PROTEINUR Negative 09/27/2017 1520   PROTEINUR NEGATIVE 12/04/2015 1137   UROBILINOGEN negative 11/17/2015 0911   NITRITE Negative 09/27/2017 1520   NITRITE NEGATIVE 12/04/2015 1137   LEUKOCYTESUR Trace (A) 09/27/2017 1520   Sepsis Labs: @LABRCNTIP (procalcitonin:4,lacticidven:4) )No results found for this or any previous visit (from the past 240 hour(s)).   Radiological Exams on Admission: Dg Chest Portable 1 View  Result Date: 09/02/2018 CLINICAL DATA:  Chest pain EXAM: PORTABLE CHEST 1 VIEW COMPARISON:  September 27, 2017 FINDINGS: The heart size is mildly enlarged. No pneumothorax. No large pleural effusion. 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 There is no acute osseous abnormality. Aortic calcifications are noted. IMPRESSION: No active disease. Electronically Signed   By: Constance Holster M.D.   On: 09/02/2018 03:12    EKG: Independently reviewed.  EKG showed normal sinus rhythm with a rate of 71.  No  significant ST changes.  Assessment/Plan Principal Problem:   Chest pain Active Problems:   Coronary atherosclerosis of native coronary artery   Hypertension complicating diabetes ([place])   Controlled diabetes mellitus type 2 with complications ([name])   Anemia of chronic disease   MDS/MPN (myelodysplastic/myeloproliferative neoplasms) ([place])     #1 chest pain: Sounds like angina.  With patient's known coronary artery disease as well as diabetes hypertension still patient is in significant risk for acute coronary syndrome.  Unstable angina most likely.  At this point symptoms have resolved.  We will admit the patient to complete cardiac work-up.  Cardiology consulted to advise on possible cardiac stress testing or cardiac cath.  Continue aspirin and statin.  #2 diabetes: Sliding scale insulin.  Monitor blood sugar closely.  #3 hypertension: Blood pressure elevated.  Resume home regimen and adjust.  #4 myelodysplastic syndrome: Being followed by oncology.  Patient has leukocytosis and anemia which is most likely part of his disease.  Monitor closely   DVT prophylaxis: Lovenox Code Status: Full code Family Communication: No family at bedside Disposition Plan: Home Consults called: Cardiology on call Admission status: Observation  Severity of Illness: The appropriate patient status for this patient is OBSERVATION. Observation status is judged to be reasonable and necessary in order to provide the required intensity of service to ensure the patient's safety. The patient's presenting symptoms, physical exam findings, and initial radiographic and laboratory data in the context of their medical condition is felt to place them at decreased risk for further clinical deterioration. Furthermore, it is anticipated that the patient will be medically stable for discharge from the hospital within 2 midnights of admission. The following factors support the patient status of observation.   " The patient's  presenting symptoms include chest pain. " The physical exam findings include no significant finding on exam. " The initial radiographic and laboratory data are mostly within normal.     Marifer Hurd,LAWAL MD [facility] Pager [phone]  If 7PM-7AM, please contact night-coverage [email or web address] Password [email or web address]  09/02/2018, 5:20 AM

## 2018-09-03 DIAGNOSIS — R079 Chest pain, unspecified: Secondary | ICD-10-CM | POA: Diagnosis not present

## 2018-09-03 LAB — BPAM RBC
Blood Product Expiration Date: 202008282359
ISSUE DATE / TIME: 202008011632
Unit Type and Rh: 5100

## 2018-09-03 LAB — TYPE AND SCREEN
ABO/RH(D): O POS
Antibody Screen: NEGATIVE
Unit division: 0

## 2018-09-03 LAB — GLUCOSE, CAPILLARY
Glucose-Capillary: 99 mg/dL (ref 70–99)
Glucose-Capillary: 108 mg/dL — ABNORMAL HIGH (ref 70–99)

## 2018-09-03 NOTE — Discharge Summary (Signed)
Physician Discharge Summary  Patient ID: Paul [name] MRN: [id number] DOB/AGE: 1940-02-12 78 y.o.  Admit date: 09/02/2018 Discharge date: 09/03/2018  Admission Diagnoses:  Discharge Diagnoses:  Principal Problem:   Chest pain Active Problems:   Coronary atherosclerosis of native coronary artery   Hypertension complicating diabetes ([place])   Controlled diabetes mellitus type 2 with complications ([name])   Anemia of chronic disease   MDS/MPN (myelodysplastic/myeloproliferative neoplasms) ([place])   Discharged Condition: stable  Hospital Course: Patient is a 78 year old Caucasian male with past medical history significant for myelodysplastic syndrome, coronary artery disease status post cath in August 2010 that revealed disease involving the RCA and LAD.  Patient was admitted with chest pain, atypical, occurred after patient ate 2 burgers.  Patient was also worried, and wanted to be sure that he did not have COVID-19 infection.  Cardiac work-up has come back negative.  COVID-19 testing also came back negative.  Patient has been cleared for discharge by the cardiology team.  Patient is eager to be discharged back home.  Patient will be discharged back home to the care of the primary care provider and cardiology team.   Consults: cardiology  Significant Diagnostic Studies:  Negative troponin. No new EKG changes.  Discharge Exam: Blood pressure (!) 144/53, pulse (!) 58, temperature 97.8 F (36.6 C), temperature source Oral, resp. rate 20, height 5\' 10"  (1.778 m), weight 85.2 kg, SpO2 99 %.   Disposition: Discharge disposition: 01-Home or Self Care   Discharge Instructions    Diet - low sodium heart healthy   Complete by: As directed    Increase activity slowly   Complete by: As directed      Allergies as of 09/03/2018      Reactions   Allopurinol Other (See Comments)   thrombocytopenia   Metformin And Related Diarrhea      Medication List    TAKE these medications    acetaminophen 500 MG tablet Commonly known as: TYLENOL Take 1,000 mg by mouth every 6 (six) hours as needed.   aspirin 81 MG chewable tablet Chew 81 mg by mouth daily.   Centrum tablet Take 1 tablet by mouth daily.   clopidogrel 75 MG tablet Commonly known as: PLAVIX TAKE 1 TABLET BY MOUTH  DAILY   colchicine 0.6 MG tablet TAKE 1 TABLET BY MOUTH  DAILY   Febuxostat 80 MG Tabs Take 80 mg by mouth daily.   fish oil-omega-3 fatty acids 1000 MG capsule Take 1 g by mouth 2 (two) times daily.   glucose blood test strip Commonly known as: Accu-Chek Aviva Plus Test once to twice daily   hydroxypropyl methylcellulose / hypromellose 2.5 % ophthalmic solution Commonly known as: ISOPTO TEARS / GONIOVISC Place 1 drop into both eyes as needed for dry eyes.   isosorbide mononitrate 30 MG 24 hr tablet Commonly known as: IMDUR TAKE 1 TABLET BY MOUTH  TWICE A DAY   metoprolol tartrate 25 MG tablet Commonly known as: LOPRESSOR TAKE 1 TABLET BY MOUTH 2  TIMES DAILY   nitroGLYCERIN 0.4 MG SL tablet Commonly known as: NITROSTAT Place 0.4 mg under the tongue every 5 (five) minutes as needed for chest pain. For 3 doses   pyridoxine 100 MG tablet Commonly known as: B-6 Take 100 mg by mouth daily.   simvastatin 20 MG tablet Commonly known as: ZOCOR TAKE 1 TABLET BY MOUTH AT  BEDTIME What changed: when to take this   vitamin B-12 1000 MCG tablet Commonly known as: CYANOCOBALAMIN Take 1 tablet (  1,000 mcg total) by mouth daily.        SignedBonnell Public 09/03/2018, 3:19 PM

## 2018-09-03 NOTE — Care Management Obs Status (Signed)
[place] NOTIFICATION   Patient Details  Name: Paul [name] MRN: [id number] Date of Birth: 12-05-1940   Medicare Observation Status Notification Given:  Yes    Claudie Leach, RN 09/03/2018, 9:24 AM

## 2018-09-04 LAB — PATHOLOGIST SMEAR REVIEW

## 2018-09-26 DIAGNOSIS — H4321 Crystalline deposits in vitreous body, right eye: Secondary | ICD-10-CM | POA: Diagnosis not present

## 2018-09-26 DIAGNOSIS — Z961 Presence of intraocular lens: Secondary | ICD-10-CM | POA: Diagnosis not present

## 2018-09-26 DIAGNOSIS — E119 Type 2 diabetes mellitus without complications: Secondary | ICD-10-CM | POA: Diagnosis not present

## 2018-09-26 DIAGNOSIS — H43811 Vitreous degeneration, right eye: Secondary | ICD-10-CM | POA: Diagnosis not present

## 2018-09-26 DIAGNOSIS — H04123 Dry eye syndrome of bilateral lacrimal glands: Secondary | ICD-10-CM | POA: Diagnosis not present

## 2018-09-26 LAB — HM DIABETES EYE EXAM

## 2018-10-23 ENCOUNTER — Other Ambulatory Visit: Payer: Self-pay

## 2018-10-23 ENCOUNTER — Inpatient Hospital Stay: Payer: Medicare Other

## 2018-10-23 ENCOUNTER — Encounter: Payer: Self-pay | Admitting: Nurse Practitioner

## 2018-10-23 ENCOUNTER — Inpatient Hospital Stay: Payer: Medicare Other | Attending: Hematology | Admitting: Nurse Practitioner

## 2018-10-23 VITALS — BP 157/52 | HR 56 | Temp 98.2°F | Resp 20 | Ht 70.0 in | Wt 191.4 lb

## 2018-10-23 DIAGNOSIS — I10 Essential (primary) hypertension: Secondary | ICD-10-CM | POA: Insufficient documentation

## 2018-10-23 DIAGNOSIS — Z86718 Personal history of other venous thrombosis and embolism: Secondary | ICD-10-CM | POA: Diagnosis not present

## 2018-10-23 DIAGNOSIS — I714 Abdominal aortic aneurysm, without rupture: Secondary | ICD-10-CM | POA: Insufficient documentation

## 2018-10-23 DIAGNOSIS — Z23 Encounter for immunization: Secondary | ICD-10-CM

## 2018-10-23 DIAGNOSIS — K5900 Constipation, unspecified: Secondary | ICD-10-CM | POA: Insufficient documentation

## 2018-10-23 DIAGNOSIS — I2510 Atherosclerotic heart disease of native coronary artery without angina pectoris: Secondary | ICD-10-CM | POA: Insufficient documentation

## 2018-10-23 DIAGNOSIS — M109 Gout, unspecified: Secondary | ICD-10-CM | POA: Diagnosis not present

## 2018-10-23 DIAGNOSIS — E538 Deficiency of other specified B group vitamins: Secondary | ICD-10-CM | POA: Diagnosis not present

## 2018-10-23 DIAGNOSIS — D469 Myelodysplastic syndrome, unspecified: Secondary | ICD-10-CM

## 2018-10-23 DIAGNOSIS — E785 Hyperlipidemia, unspecified: Secondary | ICD-10-CM | POA: Insufficient documentation

## 2018-10-23 DIAGNOSIS — E1122 Type 2 diabetes mellitus with diabetic chronic kidney disease: Secondary | ICD-10-CM | POA: Insufficient documentation

## 2018-10-23 DIAGNOSIS — Z79899 Other long term (current) drug therapy: Secondary | ICD-10-CM | POA: Insufficient documentation

## 2018-10-23 DIAGNOSIS — K7460 Unspecified cirrhosis of liver: Secondary | ICD-10-CM | POA: Insufficient documentation

## 2018-10-23 LAB — RETIC PANEL
Immature Retic Fract: 25.3 % — ABNORMAL HIGH (ref 2.3–15.9)
RBC.: 2.95 MIL/uL — ABNORMAL LOW (ref 4.22–5.81)
Retic Count, Absolute: 83.2 10*3/uL (ref 19.0–186.0)
Retic Ct Pct: 2.8 % (ref 0.4–3.1)
Reticulocyte Hemoglobin: 33.3 pg (ref >27.9)

## 2018-10-23 LAB — CBC WITH DIFFERENTIAL (CANCER CENTER ONLY)
Abs Immature Granulocytes: 0.87 10*3/uL — ABNORMAL HIGH (ref 0.00–0.07)
Basophils Absolute: 0.2 10*3/uL — ABNORMAL HIGH (ref 0.0–0.1)
Basophils Relative: 2 %
Eosinophils Absolute: 0.3 10*3/uL (ref 0.0–0.5)
Eosinophils Relative: 2 %
HCT: 26.2 % — ABNORMAL LOW (ref 39.0–52.0)
Hemoglobin: 8.7 g/dL — ABNORMAL LOW (ref 13.0–17.0)
Immature Granulocytes: 6 %
Lymphocytes Relative: 23 %
Lymphs Abs: 3.7 10*3/uL (ref 0.7–4.0)
MCH: 29.5 pg (ref 26.0–34.0)
MCHC: 33.2 g/dL (ref 30.0–36.0)
MCV: 88.8 fL (ref 80.0–100.0)
Monocytes Absolute: 0.3 10*3/uL (ref 0.1–1.0)
Monocytes Relative: 2 %
Neutro Abs: 10.6 10*3/uL — ABNORMAL HIGH (ref 1.7–7.7)
Neutrophils Relative %: 65 %
Platelet Count: 300 10*3/uL (ref 150–400)
RBC: 2.95 MIL/uL — ABNORMAL LOW (ref 4.22–5.81)
RDW: 22.5 % — ABNORMAL HIGH (ref 11.5–15.5)
WBC Count: 16 10*3/uL — ABNORMAL HIGH (ref 4.0–10.5)
nRBC: 0.2 % (ref 0.0–0.2)

## 2018-10-23 LAB — VITAMIN B12: Vitamin B-12: 1438 pg/mL — ABNORMAL HIGH (ref 180–914)

## 2018-10-23 MED ORDER — INFLUENZA VAC SPLIT QUAD 0.5 ML IM SUSY
PREFILLED_SYRINGE | INTRAMUSCULAR | Status: AC
  Filled 2018-10-23: qty 0.5

## 2018-10-23 MED ORDER — INFLUENZA VAC A&B SA ADJ QUAD 0.5 ML IM PRSY
0.5000 mL | PREFILLED_SYRINGE | Freq: Once | INTRAMUSCULAR | Status: AC
  Administered 2018-10-23: 0.5 mL via INTRAMUSCULAR

## 2018-10-23 NOTE — Progress Notes (Addendum)
[place]   [phone] [phone]) [phone]   Clinic Follow up Note   Patient Care Team: Denita Lung, MD as PCP - General (Family Medicine) Burnell Blanks, MD as PCP - Cardiology (Cardiology) 10/23/2018  CHIEF COMPLAINT: MDS/MPN, with mild leukocytosis and moderate anemia, JAK2 V617F (-)  CURRENT THERAPY: Observation  INTERVAL HISTORY: Paul [name] returns for f/u as scheduled. He was last seen by Dr. Burr Medico in 04/2018, with CBC monitoring q2 months since then. He was brought to ED for chest pain on 09/02/18. Cardiac workup was negative. COVID19 test also negative. Hgb down to 8.6 during overnight observation, he received 1 unit RBCs. He remains very fatigued and sedentary, not able to be very mobile. He has chronic dyspnea which is stable. Does not use oxygen or inhaler. Also has intermittent chest pain, requiring frequent nitroglycerine doses, has f/u with cardiology next month. Denies current chest pain. Cough with clear phlegm is at baseline. Denies bleeding, bloody or black stool. Not on iron. Denies recent infection, weight loss. Denies pain. He lives with his wife, no home health. He manages fine at home.    MEDICAL HISTORY:  Past Medical History:  Diagnosis Date  . AAA (abdominal aortic aneurysm) ([place])   . Abnormal LFTs   . AKI (acute kidney injury) ([place])   . Anemia of chronic disease   . Arthritis    "all over" (12/04/2015)  . Atherosclerosis of native coronary artery of native heart without angina pectoris   . CAD (coronary artery disease)    with cath 09/26/08 showing severe heart disease in RCA with POBA, mild to mod diesease in circ and LAD  . Chronic back pain    back problems prior to surgery   . Constipation   . Coronary atherosclerosis of native coronary artery   . Dehydration   . Diabetes mellitus    with neuropathy  . Diarrhea   . Elevated liver enzymes   . Generalized weakness   . Gout of right hand   . Hiatal hernia   .  History of gastroesophageal reflux (GERD)   . History of stomach ulcers   . Hyperlipidemia   . Hypertension   . Metabolic acidosis   . Normochromic anemia 02/23/2016  . 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 Thrombocytopenia ([place])   . Unintentional weight loss 09/27/2017   33 lbs between 4/19 & 8/19  . Vitamin B12 deficiency   . Vitamin B12 deficiency   . Vitamin D deficiency   . Weakness     SURGICAL HISTORY: Past Surgical History:  Procedure Laterality Date  . ANKLE FRACTURE SURGERY Left    "crushed"  . BACK SURGERY  X 3   "fell 4 stories in 1977"  . CARPAL TUNNEL RELEASE Left   . CORONARY ANGIOPLASTY WITH STENT PLACEMENT  09/26/2008   [name] 06/03/2010  . FRACTURE SURGERY    . LEFT HEART CATHETERIZATION WITH CORONARY ANGIOGRAM N/A 01/21/2012   Procedure: LEFT HEART CATHETERIZATION WITH CORONARY ANGIOGRAM;  Surgeon: Burnell Blanks, MD;  Location: [facility] CATH LAB;  Service: Cardiovascular;  Laterality: N/A;  . LUMBAR [place]; ~ 1980  . POSTERIOR LUMBAR FUSION  ~ 1998   "3rd OR to reconstruct my back after fall in 1977"  . 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  ALLERGIES:  is allergic to allopurinol and  metformin and related.  MEDICATIONS:  Current Outpatient Medications  Medication Sig Dispense Refill  . acetaminophen (TYLENOL) 500 MG tablet Take 1,000 mg by mouth every 6 (six) hours as needed.    Marland Kitchen aspirin 81 MG chewable tablet Chew 81 mg by mouth daily.    . clopidogrel (PLAVIX) 75 MG tablet TAKE 1 TABLET BY MOUTH  DAILY 90 tablet 1  . colchicine 0.6 MG tablet TAKE 1 TABLET BY MOUTH  DAILY (Patient taking differently: Take 0.6 mg by mouth daily. ) 90 tablet 1  . Febuxostat 80 MG TABS Take 80 mg by mouth daily.    . fish oil-omega-3 fatty acids 1000 MG capsule Take 1 g by mouth 2 (two) times daily.     Marland Kitchen glucose blood (ACCU-CHEK AVIVA PLUS) test  strip Test once to twice daily 200 each 4  . hydroxypropyl methylcellulose / hypromellose (ISOPTO TEARS / GONIOVISC) 2.5 % ophthalmic solution Place 1 drop into both eyes as needed for dry eyes.    . isosorbide mononitrate (IMDUR) 30 MG 24 hr tablet TAKE 1 TABLET BY MOUTH  TWICE A DAY (Patient taking differently: Take 30 mg by mouth 2 (two) times daily. ) 180 tablet 3  . metoprolol tartrate (LOPRESSOR) 25 MG tablet TAKE 1 TABLET BY MOUTH 2  TIMES DAILY (Patient taking differently: Take 25 mg by mouth 2 (two) times daily. ) 180 tablet 3  . Multiple Vitamins-Minerals (CENTRUM) tablet Take 1 tablet by mouth daily.    . nitroGLYCERIN (NITROSTAT) 0.4 MG SL tablet Place 0.4 mg under the tongue every 5 (five) minutes as needed for chest pain. For 3 doses    . pyridoxine (B-6) 100 MG tablet Take 100 mg by mouth daily.    . simvastatin (ZOCOR) 20 MG tablet TAKE 1 TABLET BY MOUTH AT  BEDTIME (Patient taking differently: Take 20 mg by mouth daily at 6 PM. ) 90 tablet 2  . vitamin B-12 (CYANOCOBALAMIN) 1000 MCG tablet Take 1 tablet (1,000 mcg total) by mouth daily. 90 tablet 3   No current facility-administered medications for this visit.     PHYSICAL EXAMINATION: ECOG PERFORMANCE STATUS: 2-3  Vitals:   10/23/18 1520 10/23/18 1528  BP: (!) 173/55 (!) 157/52  Pulse:  (!) 56  Resp:    Temp:    SpO2:     Filed Weights   10/23/18 1519  Weight: 191 lb 6.4 oz (86.8 kg)    GENERAL:alert, no distress and comfortable SKIN: no rash  EYES:  sclera clear NECK: without mass LUNGS: clear with normal breathing effort HEART: regular rate & rhythm, no lower extremity edema Musculoskeletal:no cyanosis of digits NEURO: alert & oriented x 3 with fluent speech, normal gait   LABORATORY DATA:  I have reviewed the data as listed CBC Latest Ref Rng & Units 10/23/2018 09/02/2018 09/02/2018  WBC 4.0 - 10.5 K/uL 16.0(H) - -  Hemoglobin 13.0 - 17.0 g/dL 8.7(L) 9.7(L) 8.9(L)  Hematocrit 39.0 - 52.0 % 26.2(L) 27.7(L)  26.2(L)  Platelets 150 - 400 K/uL 300 - -     CMP Latest Ref Rng & Units 09/02/2018 09/02/2018 12/05/2017  Glucose 70 - 99 mg/dL - 154(H) 103(H)  BUN 8 - 23 mg/dL - 21 26(H)  Creatinine 0.61 - 1.24 mg/dL 1.24 1.14 1.24  Sodium 135 - 145 mmol/L - 136 137  Potassium 3.5 - 5.1 mmol/L - 3.8 4.1  Chloride 98 - 111 mmol/L - 108 106  CO2 22 - 32 mmol/L - 21(L) 24  Calcium 8.9 -  10.3 mg/dL - 8.8(L) 9.0  Total Protein 6.5 - 8.1 g/dL - 7.1 -  Total Bilirubin 0.3 - 1.2 mg/dL - 0.9 -  Alkaline Phos 38 - 126 U/L - 71 -  AST 15 - 41 U/L - 29 -  ALT 0 - 44 U/L - 21 -      RADIOGRAPHIC STUDIES: I have personally reviewed the radiological images as listed and agreed with the findings in the report. No results found.   ASSESSMENT & PLAN: Paul [name] is a 78 y.o. caucasian male with a history of AAA, Gout, DM, arthritis, HLD, HTN, vitamin deficiencies, H/o DVT of right femoral vein.    1. MDS/MPN, JAK2 (-), cytogenetics normal  -He has had anemia issues since mid 2017 and Hg has in 9-10 range most of time, he also has mild leukocytosis, no thrombocytosis  -Other contributions to his anemia include stage III chronic renal insufficiency and chronic gouty arthritis, heart disease, and pulmonary fibrosis.  Previous iron study and B12 level were normal.  She was on oral iron, off since earlier this year due to constipation. -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   -His leukocytosis was very mild, slightly elevated today. No thrombocytosis, risk of thrombosis is not very high, he does not need treatment such as hydrea. He is currently on aspirin and Plavix, which also helps to prevent thrombosis. He is sedentary due to his fatigue and chronic medical conditions, he was encouraged to increase mobility to reduce risk of thrombosis.   -He has not been on active treatment as his levels have been stable. His anemia worsened over the last 2 months, now 8.7, and not improved with RBC transfusion. Plan to initiate treatment at this point  2. Gout -Had allergic reaction to Allopurinol  -Now controlled with colchicine and Uloric  3. 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 He currently smokes 1/2ppd.  -CT chest from 11/2017 shows mild emphysema and small right pleural effusion, pulmonary parenchymal fibrosis, probable UIP.  -chest xray on 09/02/18 for cardiac work up shows pulmonary fibrosis; he is symptomatic with dyspnea, fatigue  -I recommend referral to pulmonology for further work up and management, he declined   4. AAA, CAD with stent placed -followed by Dr. Doren Custard, vascular surgeon. -On Plavix and ASA  5. Mild liver cirrhosis  -h/o moderate alcohol; CT AP from 10/2017 which showed nodularity suspicious of cirrhosis.   6. H/o of right LE DVT, provoked  -Treated with 3 months of Xarelto remotely  Disposition:  Paul [name] appears stable. He has stable chronic but significant fatigue, his anemia has worsened since July. He received RBC transfusion on 8/1, Hgb improved to 9.7 but dropped over the next 6 weeks, to 8.7 today. We reviewed the natural course of MDS/MPN; he has worsening symptomatic anemia, which in his case is related to his bone marrow disorder and component of anemia of chronic disease, related to CKD, coronary disease and pulmonary fibrosis. He declined referral to pulmonology today. He has slightly elevated WBC, up to 16.0 today. PLT still normal.   The patient was seen by Dr. Burr Medico today who reviewed his condition and recommends treatment for his symptomatic anemia with Aranesp. She reviewed indication and potential side effects such as risk of thrombosis and black box warming for potential tumor  growth if patient has any active malignancy. He reports  being up to date on prostate cancer screening, no known history of cancer. He had previous DVT, treated with xarelto x3 months remotely. On aspirin and plavix now. Will keep Hgb goal 9-10.5. he was encouraged to increase mobility and activity.    He will receive flu vaccine today. He will return next week to start therapy after we get insurance approval. He understands and agrees to the plan. Will monitor counts closely, monthly x3 with injection. Will increase his dose based on Hgb response. F/u in 3 months or sooner if needed.   All questions were answered. The patient knows to call the clinic with any problems, questions or concerns. No barriers to learning was detected.     Alla Feeling, NP 10/23/18   Addendum   I have seen the patient, examined him. I agree with the assessment and and plan and have edited the notes.   Pt has developed worsening anemia, he received one unit of bleeding transfusion last month. He has been more fatigued, with dyspnea on exertion, and intermittent chest pain, which probably more related to his cardiac disease and pulmonary fibrosis.  However I do think his anemia may have contributed to his symptoms also.  I recommend him to start ESA in a few weeks to improve his anemia and decrease his need for blood transfusion.  Potential benefit and side effects especially risk of thrombosis, tumor grow if he has active cancer, etc.  He voiced good understanding and agrees to proceed.  We will get his insurance approval and start in a few weeks. Will check his insurance which ESA is on the preferred list.  Truitt Merle  10/23/2018

## 2018-10-24 ENCOUNTER — Telehealth: Payer: Self-pay | Admitting: Nurse Practitioner

## 2018-10-24 LAB — IRON AND TIBC
Iron: 71 ug/dL (ref 42–163)
Saturation Ratios: 29 % (ref 20–55)
TIBC: 248 ug/dL (ref 202–409)
UIBC: 177 ug/dL (ref 117–376)

## 2018-10-24 LAB — FERRITIN: Ferritin: 190 ng/mL (ref 24–336)

## 2018-10-24 NOTE — Telephone Encounter (Signed)
Scheduled appt per 9/21 los.  Spoke with patient and patient is aware of their appt date and time.

## 2018-10-26 ENCOUNTER — Other Ambulatory Visit: Payer: Self-pay | Admitting: Nurse Practitioner

## 2018-10-27 ENCOUNTER — Other Ambulatory Visit: Payer: Self-pay | Admitting: Hematology

## 2018-10-30 ENCOUNTER — Telehealth: Payer: Self-pay

## 2018-10-30 ENCOUNTER — Inpatient Hospital Stay: Payer: Medicare Other

## 2018-10-30 ENCOUNTER — Other Ambulatory Visit: Payer: Self-pay

## 2018-10-30 VITALS — BP 154/62 | HR 72 | Temp 98.2°F | Resp 18

## 2018-10-30 DIAGNOSIS — E1122 Type 2 diabetes mellitus with diabetic chronic kidney disease: Secondary | ICD-10-CM | POA: Diagnosis not present

## 2018-10-30 DIAGNOSIS — M109 Gout, unspecified: Secondary | ICD-10-CM | POA: Diagnosis not present

## 2018-10-30 DIAGNOSIS — Z23 Encounter for immunization: Secondary | ICD-10-CM | POA: Diagnosis not present

## 2018-10-30 DIAGNOSIS — Z79899 Other long term (current) drug therapy: Secondary | ICD-10-CM | POA: Diagnosis not present

## 2018-10-30 DIAGNOSIS — K5900 Constipation, unspecified: Secondary | ICD-10-CM | POA: Diagnosis not present

## 2018-10-30 DIAGNOSIS — D469 Myelodysplastic syndrome, unspecified: Secondary | ICD-10-CM | POA: Diagnosis not present

## 2018-10-30 DIAGNOSIS — E785 Hyperlipidemia, unspecified: Secondary | ICD-10-CM | POA: Diagnosis not present

## 2018-10-30 DIAGNOSIS — Z86718 Personal history of other venous thrombosis and embolism: Secondary | ICD-10-CM | POA: Diagnosis not present

## 2018-10-30 DIAGNOSIS — D638 Anemia in other chronic diseases classified elsewhere: Secondary | ICD-10-CM

## 2018-10-30 DIAGNOSIS — K7460 Unspecified cirrhosis of liver: Secondary | ICD-10-CM | POA: Diagnosis not present

## 2018-10-30 DIAGNOSIS — E538 Deficiency of other specified B group vitamins: Secondary | ICD-10-CM | POA: Diagnosis not present

## 2018-10-30 DIAGNOSIS — I714 Abdominal aortic aneurysm, without rupture: Secondary | ICD-10-CM | POA: Diagnosis not present

## 2018-10-30 DIAGNOSIS — I10 Essential (primary) hypertension: Secondary | ICD-10-CM | POA: Diagnosis not present

## 2018-10-30 DIAGNOSIS — I2510 Atherosclerotic heart disease of native coronary artery without angina pectoris: Secondary | ICD-10-CM | POA: Diagnosis not present

## 2018-10-30 MED ORDER — EPOETIN ALFA-EPBX 10000 UNIT/ML IJ SOLN
10000.0000 [IU] | Freq: Once | INTRAMUSCULAR | Status: AC
  Administered 2018-10-30: 10000 [IU] via SUBCUTANEOUS
  Filled 2018-10-30: qty 1

## 2018-10-30 NOTE — Telephone Encounter (Signed)
-----   Message from Truitt Merle, MD sent at 10/27/2018  4:40 PM EDT ----- Regarding: RE: TX plan change Retacrit is short acting epo, does not last long. But he may not need tha tmuch, so OK to start at every 4 weeks.  Renlee Floor, please let pt know that his insurance does not approve the long acting growth factor for his anemia, so we will use the short acting one, please schedule a lab appointment in 2 weeks after first dose, then we can decide if he needs more frequent injections in a few months. OK to keep his scheduled appointment.  Thanks  Krista Blue   ----- Message ----- From: Alla Feeling, NP Sent: 10/26/2018   9:01 PM EDT To: Elliot Gault, Truitt Merle, MD, # Subject: RE: TX plan change                             I corrected the order, now Retacrit 10,000 units given monthly. First injection 9/28.  Dr. Burr Medico, can you confirm the dosing is correct.   Thanks, Regan Rakers  ----- Message ----- From: Elliot Gault Sent: 10/24/2018   1:52 PM EDT To: Alla Feeling, NP, Daisey Must Subject: TX plan change                                 Good Afternoon - patient is scheduled for tx on 9/28. Your tx plan is for Aranesp. His insurance co prefers Retacrit. Can you change the tx plan?  No Josem Kaufmann is required and he will be good to go.  Thank you,  Velna Hatchet

## 2018-10-30 NOTE — Patient Instructions (Signed)

## 2018-10-30 NOTE — Telephone Encounter (Signed)
Spoke with patient regarding injection today and need for repeat labs in 2 weeks. Patient verbalized understanding and agreement, and denied any further needs at this time.   Scheduling message sent for patient to be scheduled for labs week of 11/13/18

## 2018-10-31 ENCOUNTER — Telehealth: Payer: Self-pay | Admitting: Hematology

## 2018-10-31 NOTE — Telephone Encounter (Signed)
Scheduled appt per 9/28 sch message - pt aware of appt date and time

## 2018-11-13 ENCOUNTER — Inpatient Hospital Stay: Payer: Medicare Other | Attending: Hematology

## 2018-11-13 ENCOUNTER — Other Ambulatory Visit: Payer: Self-pay

## 2018-11-13 DIAGNOSIS — Z79899 Other long term (current) drug therapy: Secondary | ICD-10-CM | POA: Insufficient documentation

## 2018-11-13 DIAGNOSIS — D469 Myelodysplastic syndrome, unspecified: Secondary | ICD-10-CM | POA: Insufficient documentation

## 2018-11-13 LAB — CBC WITH DIFFERENTIAL (CANCER CENTER ONLY)
Abs Immature Granulocytes: 0.18 10*3/uL — ABNORMAL HIGH (ref 0.00–0.07)
Basophils Absolute: 0.2 10*3/uL — ABNORMAL HIGH (ref 0.0–0.1)
Basophils Relative: 2 %
Eosinophils Absolute: 0.3 10*3/uL (ref 0.0–0.5)
Eosinophils Relative: 3 %
HCT: 27.7 % — ABNORMAL LOW (ref 39.0–52.0)
Hemoglobin: 9.3 g/dL — ABNORMAL LOW (ref 13.0–17.0)
Immature Granulocytes: 2 %
Lymphocytes Relative: 27 %
Lymphs Abs: 3.1 10*3/uL (ref 0.7–4.0)
MCH: 30.3 pg (ref 26.0–34.0)
MCHC: 33.6 g/dL (ref 30.0–36.0)
MCV: 90.2 fL (ref 80.0–100.0)
Monocytes Absolute: 0.3 10*3/uL (ref 0.1–1.0)
Monocytes Relative: 3 %
Neutro Abs: 7.3 10*3/uL (ref 1.7–7.7)
Neutrophils Relative %: 63 %
Platelet Count: 180 10*3/uL (ref 150–400)
RBC: 3.07 MIL/uL — ABNORMAL LOW (ref 4.22–5.81)
RDW: 22.5 % — ABNORMAL HIGH (ref 11.5–15.5)
WBC Count: 11.4 10*3/uL — ABNORMAL HIGH (ref 4.0–10.5)
nRBC: 0 % (ref 0.0–0.2)

## 2018-11-13 LAB — RETIC PANEL
Immature Retic Fract: 18.9 % — ABNORMAL HIGH (ref 2.3–15.9)
RBC.: 3.09 MIL/uL — ABNORMAL LOW (ref 4.22–5.81)
Retic Count, Absolute: 116.5 10*3/uL (ref 19.0–186.0)
Retic Ct Pct: 3.8 % — ABNORMAL HIGH (ref 0.4–3.1)
Reticulocyte Hemoglobin: 32.7 pg (ref >27.9)

## 2018-11-18 ENCOUNTER — Other Ambulatory Visit: Payer: Self-pay | Admitting: Family Medicine

## 2018-11-18 ENCOUNTER — Other Ambulatory Visit: Payer: Self-pay | Admitting: Cardiovascular Disease

## 2018-11-18 DIAGNOSIS — E7849 Other hyperlipidemia: Secondary | ICD-10-CM

## 2018-11-18 DIAGNOSIS — I2510 Atherosclerotic heart disease of native coronary artery without angina pectoris: Secondary | ICD-10-CM

## 2018-11-23 ENCOUNTER — Ambulatory Visit: Payer: Medicare Other | Admitting: Cardiovascular Disease

## 2018-11-23 ENCOUNTER — Other Ambulatory Visit: Payer: Self-pay

## 2018-11-23 ENCOUNTER — Encounter: Payer: Self-pay | Admitting: Cardiovascular Disease

## 2018-11-23 VITALS — BP 164/54 | HR 60 | Ht 70.0 in | Wt 190.4 lb

## 2018-11-23 DIAGNOSIS — I10 Essential (primary) hypertension: Secondary | ICD-10-CM | POA: Diagnosis not present

## 2018-11-23 DIAGNOSIS — E7849 Other hyperlipidemia: Secondary | ICD-10-CM | POA: Diagnosis not present

## 2018-11-23 DIAGNOSIS — I25118 Atherosclerotic heart disease of native coronary artery with other forms of angina pectoris: Secondary | ICD-10-CM

## 2018-11-23 DIAGNOSIS — I351 Nonrheumatic aortic (valve) insufficiency: Secondary | ICD-10-CM | POA: Diagnosis not present

## 2018-11-23 MED ORDER — AMLODIPINE BESYLATE 5 MG PO TABS: 5.0000 mg | ORAL_TABLET | Freq: Every day | ORAL | 3 refills | Status: DC

## 2018-11-23 MED ORDER — SIMVASTATIN 20 MG PO TABS: 20.0000 mg | ORAL_TABLET | Freq: Every day | ORAL | 3 refills | Status: DC

## 2018-11-23 MED ORDER — ISOSORBIDE MONONITRATE ER 30 MG PO TB24: 30.0000 mg | ORAL_TABLET | Freq: Two times a day (BID) | ORAL | 3 refills | Status: DC

## 2018-11-23 MED ORDER — METOPROLOL TARTRATE 25 MG PO TABS: 25.0000 mg | ORAL_TABLET | Freq: Two times a day (BID) | ORAL | 3 refills | Status: DC

## 2018-11-23 NOTE — Patient Instructions (Signed)
Medication Instructions:  Your physician has recommended you make the following change in your medication:  1.) start amlodipine (Norvasc) 5 mg once a day  *If you need a refill on your cardiac medications before your next appointment, please call your pharmacy*  Lab Work: none If you have labs (blood work) drawn today and your tests are completely normal, you will receive your results only by: Marland Kitchen MyChart Message (if you have MyChart) OR . A paper copy in the mail If you have any lab test that is abnormal or we need to change your treatment, we will call you to review the results.  Testing/Procedures: none  Follow-Up: At [facility], you and your health needs are our priority.  As part of our continuing mission to provide you with exceptional heart care, we have created designated Provider Care Teams.  These Care Teams include your primary Cardiologist (physician) and Advanced Practice Providers (APPs -  Physician Assistants and Nurse Practitioners) who all work together to provide you with the care you need, when you need it.  Your next appointment:   3 months  The format for your next appointment:   In Person  Provider:   Lauree Chandler, MD  Other Instructions

## 2018-11-23 NOTE — Progress Notes (Signed)
Chief Complaint  Patient presents with  . Follow-up    CAD     History of Present Illness: 78 yo male with history of CAD, DM, bilateral carotid artery disease, HTN, HLD and DVT here today for cardiology follow up. His echo in 2009 showed mild LVH and normal LV function with no significant valvular abnormalities. His cath in 2009 showed a near total occlusion of the RCA beyond the stent in the mid portion. I attempted PCI and was able to balloon the mid lesion but could never pass a balloon distally. He did well after that.  Cardiac cath in December 2013 in the setting of worsened chest pain/dsyspnea showed total occlusion of the RCA which was not amenable to PCI (PCI attempted in 2009). The LAD and Circumflex had moderate disease. Normal ABI 10/01/13. Carotid artery dopplers July 2018 with stable moderate bilateral ICA stenosis. He was found to have a DVT in November 2017 and was treated with Xarelto. He is now off of Xarelto and back on ASA and Plavix. Echo November 2017 with normal LV systolic function, moderate AI. He has been followed by in the Heme/Onc clinic for unintentional weight loss and chronic macrocytic anemia. Malignancy workup was negative. Chest pain in August 2020 and admitted overnight with negative troponin. His chest pain occurred after eating two burgers. Echo 09/02/18 with LVEF=60-65%. Moderate AI. Mild AS.   He is here today for follow up. The patient denies any dyspnea, palpitations, lower extremity edema, orthopnea, PND, dizziness, near syncope or syncope. He has been having chest pain several times per week, mostly at night when getting ready for bed. This started in August when he was more anemic. This is getting less frequent as his Hgb has come up. No exertional chest pain.   Primary Care Physician: Denita Lung, MD   Past Medical History:  Diagnosis Date  . AAA (abdominal aortic aneurysm) ([place])   . Abnormal LFTs   . AKI (acute kidney injury) ([place])   . Anemia of  chronic disease   . Arthritis    "all over" (12/04/2015)  . Atherosclerosis of native coronary artery of native heart without angina pectoris   . CAD (coronary artery disease)    with cath 09/26/08 showing severe heart disease in RCA with POBA, mild to mod diesease in circ and LAD  . Chronic back pain    back problems prior to surgery   . Constipation   . Coronary atherosclerosis of native coronary artery   . Dehydration   . Diabetes mellitus    with neuropathy  . Diarrhea   . Elevated liver enzymes   . Generalized weakness   . Gout of right hand   . Hiatal hernia   . History of gastroesophageal reflux (GERD)   . History of stomach ulcers   . Hyperlipidemia   . Hypertension   . Metabolic acidosis   . Normochromic anemia 02/23/2016  . 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 Thrombocytopenia ([place])   . Unintentional weight loss 09/27/2017   33 lbs between 4/19 & 8/19  . Vitamin B12 deficiency   . Vitamin B12 deficiency   . Vitamin D deficiency   . Weakness     Past Surgical History:  Procedure Laterality Date  . ANKLE FRACTURE SURGERY Left    "crushed"  . BACK SURGERY  X 3   "fell 4 stories in 1977"  . CARPAL TUNNEL RELEASE Left   . CORONARY ANGIOPLASTY WITH STENT PLACEMENT  09/26/2008   [name]  06/03/2010  . FRACTURE SURGERY    . LEFT HEART CATHETERIZATION WITH CORONARY ANGIOGRAM N/A 01/21/2012   Procedure: LEFT HEART CATHETERIZATION WITH CORONARY ANGIOGRAM;  Surgeon: Burnell Blanks, MD;  Location: [facility] CATH LAB;  Service: Cardiovascular;  Laterality: N/A;  . LUMBAR [place]; ~ 1980  . POSTERIOR LUMBAR FUSION  ~ 1998   "3rd OR to reconstruct my back after fall in 1977"  . 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 acetaminophen (TYLENOL) 500 MG tablet Take 1,000 mg by mouth every 6 (six) hours as needed.    Marland Kitchen aspirin 81 MG chewable tablet Chew 81 mg by mouth daily.    . clopidogrel (PLAVIX) 75 MG  tablet TAKE 1 TABLET BY MOUTH  DAILY 90 tablet 3  . colchicine 0.6 MG tablet TAKE 1 TABLET BY MOUTH  DAILY 90 tablet 3  . Febuxostat 80 MG TABS Take 80 mg by mouth daily.    . fish oil-omega-3 fatty acids 1000 MG capsule Take 1 g by mouth 2 (two) times daily.     Marland Kitchen glucose blood (ACCU-CHEK AVIVA PLUS) test strip Test once to twice daily 200 each 4  . hydroxypropyl methylcellulose / hypromellose (ISOPTO TEARS / GONIOVISC) 2.5 % ophthalmic solution Place 1 drop into both eyes as needed for dry eyes.    . isosorbide mononitrate (IMDUR) 30 MG 24 hr tablet Take 1 tablet (30 mg total) by mouth 2 (two) times daily. 180 tablet 3  . metoprolol tartrate (LOPRESSOR) 25 MG tablet Take 1 tablet (25 mg total) by mouth 2 (two) times daily. 180 tablet 3  . Multiple Vitamins-Minerals (CENTRUM) tablet Take 1 tablet by mouth daily.    . nitroGLYCERIN (NITROSTAT) 0.4 MG SL tablet Place 0.4 mg under the tongue every 5 (five) minutes as needed for chest pain. For 3 doses    . pyridoxine (B-6) 100 MG tablet Take 100 mg by mouth daily.    . simvastatin (ZOCOR) 20 MG tablet Take 1 tablet (20 mg total) by mouth at bedtime. 90 tablet 3  . amLODipine (NORVASC) 5 MG tablet Take 1 tablet (5 mg total) by mouth daily. 90 tablet 3   No current facility-administered medications for this visit.     Allergies  Allergen Reactions  . Allopurinol Other (See Comments)    thrombocytopenia  . Metformin And Related Diarrhea    Social History   Socioeconomic History  . Marital status: Married    Spouse name: Scientist, physiological  . Number of children: 0  . Years of education: Not on file  . Highest education level: Not on file  Occupational History  . Not on file  Social Needs  . Financial resource strain: Not on file  . Food insecurity    Worry: Not on file    Inability: Not on file  . Transportation needs    Medical: Not on file    Non-medical: Not on file  Tobacco Use  . Smoking status: Current Every Day Smoker    Packs/day:  0.50    Years: 20.00    Pack years: 10.00    Types: Cigars    Last attempt to quit: 01/24/2013    Years since quitting: 5.8  . Smokeless tobacco: Never Used  Substance and Sexual Activity  . Alcohol use: Yes    Comment: "Moonshine" lately. moderate drinker for 20 years   . Drug use: No  . Sexual  activity: Not on file  Lifestyle  . Physical activity    Days per week: Not on file    Minutes per session: Not on file  . Stress: Not on file  Relationships  . Social Herbalist on phone: Not on file    Gets together: Not on file    Attends religious service: Not on file    Active member of club or organization: Not on file    Attends meetings of clubs or organizations: Not on file    Relationship status: Not on file  . Intimate partner violence    Fear of current or ex partner: Not on file    Emotionally abused: Not on file    Physically abused: Not on file    Forced sexual activity: Not on file  Other Topics Concern  . Not on file  Social History Narrative   Divorced, 1 child.    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    Owned roofing co. In [place]    Family History  Problem Relation Age of Onset  . Heart attack Mother   . Cancer Mother   . Heart disease Mother   . Heart attack Sister   . Heart disease Sister     Review of Systems:  As stated in the HPI and otherwise negative.   BP (!) 164/54   Pulse 60   Ht 5\' 10"  (1.778 m)   Wt 190 lb 6.4 oz (86.4 kg)   SpO2 96%   BMI 27.32 kg/m   Physical Examination:  General: Well developed, well nourished, NAD  HEENT: OP clear, mucus membranes moist  SKIN: warm, dry. No rashes. Neuro: No focal deficits  Musculoskeletal: Muscle strength 5/5 all ext  Psychiatric: Mood and affect normal  Neck: No JVD, no carotid bruits, no thyromegaly, no lymphadenopathy.  Lungs:Clear bilaterally, no wheezes, rhonci, crackles Cardiovascular: Regular rate and rhythm. Soft systolic murmur noted.   Abdomen:Soft. Bowel sounds present. Non-tender.  Extremities: No lower extremity edema. Pulses are 2 + in the bilateral DP/PT.  Echo August 2020:  1. The left ventricle has normal systolic function with an ejection fraction of 60-65%. The cavity size was normal. There is moderately increased left ventricular wall thickness. Left ventricular diastolic Doppler parameters are consistent with impaired  relaxation. Elevated mean left atrial pressure.  2. The right ventricle has normal systolic function. The cavity was normal. There is no increase in right ventricular wall thickness.  3. Left atrial size was mildly dilated.  4. No evidence of mitral valve stenosis.  5. The aortic valve has an indeterminate number of cusps. Severely thickening of the aortic valve. Severe calcifcation of the aortic valve. Aortic valve regurgitation is moderate by color flow Doppler. Mild stenosis of the aortic valve. Severe aortic  annular calcification noted.  6. The aorta is normal in size and structure.  7. The aortic root is normal in size and structure.  8. Pulmonary hypertension is indeterminate, inadequate TR jet.  EKG:  EKG is ordered today. The ekg ordered today demonstrates NSR, rate 60 bpm. Chronic T wave inversions inferior leads  Recent Labs: 09/02/2018: ALT 21; BUN 21; Creatinine, Ser 1.24; Potassium 3.8; Sodium 136 11/13/2018: Hemoglobin 9.3; Platelet Count 180   Lipid Panel    Component Value Date/Time   CHOL 83 (L) 09/19/2017 1037   TRIG 84 09/19/2017 1037   HDL 34 (L) 09/19/2017 1037   CHOLHDL 2.4 09/19/2017 1037   CHOLHDL 3.6  03/03/2015 0001   VLDL 27 03/03/2015 0001   LDLCALC 32 09/19/2017 1037     Wt Readings from Last 3 Encounters:  11/23/18 190 lb 6.4 oz (86.4 kg)  10/23/18 191 lb 6.4 oz (86.8 kg)  09/03/18 187 lb 14.4 oz (85.2 kg)     Other studies Reviewed: Additional studies/ records that were reviewed today include: . Review of the above records demonstrates:     Assessment and Plan:   1. CAD with chronic stable angina: Still having some chest pains, mostly at night. This was worse when he was more anemic and is now much improved. His BP has been elevated too. His RCA is known to be occluded and not amenable to PCI. There is moderate disease in the LAD and Circumflex by cath in 2013. LV function normal by echo in August 2020. Will continue ASA, Plavix, statin, Imdur and beta blocker. He is due for an epo injection next week. If his chest pain persists after BP is controlled and anemia is corrected, would need a repeat cardiac cath.   2. HTN: BP is elevated today. Will add Norvasc 5 mg daily.   3. Carotid artery disease: Moderate bilateral ICA stenosis, stable by dopplers July 2018. He does not wish to repeat now.   4. Hyperlipidemia: Lipids well controlled in 2019. Will continue statin.   5. Aortic valve disease: Mild AS and moderate by echo August 2020.   Current medicines are reviewed at length with the patient today.  The patient does not have concerns regarding medicines.  The following changes have been made:  no change  Labs/ tests ordered today include:   No orders of the defined types were placed in this encounter.   Disposition:   FU with me in 12 months  Signed, Lauree Chandler, MD 11/23/2018 4:39 PM    [facility] [address], [place], [place]  [address] Phone: [phone]; Fax: [phone]

## 2018-11-24 NOTE — Addendum Note (Signed)
Addended by: Carylon Perches on: 11/24/2018 10:37 AM   Modules accepted: Orders

## 2018-11-29 ENCOUNTER — Other Ambulatory Visit: Payer: Self-pay

## 2018-11-29 ENCOUNTER — Inpatient Hospital Stay: Payer: Medicare Other

## 2018-11-29 VITALS — BP 157/58 | HR 64 | Temp 98.2°F | Resp 18

## 2018-11-29 DIAGNOSIS — D638 Anemia in other chronic diseases classified elsewhere: Secondary | ICD-10-CM

## 2018-11-29 DIAGNOSIS — D469 Myelodysplastic syndrome, unspecified: Secondary | ICD-10-CM

## 2018-11-29 DIAGNOSIS — Z79899 Other long term (current) drug therapy: Secondary | ICD-10-CM | POA: Diagnosis not present

## 2018-11-29 LAB — CBC WITH DIFFERENTIAL (CANCER CENTER ONLY)
Abs Immature Granulocytes: 0.17 10*3/uL — ABNORMAL HIGH (ref 0.00–0.07)
Basophils Absolute: 0.2 10*3/uL — ABNORMAL HIGH (ref 0.0–0.1)
Basophils Relative: 2 %
Eosinophils Absolute: 0.3 10*3/uL (ref 0.0–0.5)
Eosinophils Relative: 3 %
HCT: 28.1 % — ABNORMAL LOW (ref 39.0–52.0)
Hemoglobin: 9.6 g/dL — ABNORMAL LOW (ref 13.0–17.0)
Immature Granulocytes: 1 %
Lymphocytes Relative: 23 %
Lymphs Abs: 2.7 10*3/uL (ref 0.7–4.0)
MCH: 30.2 pg (ref 26.0–34.0)
MCHC: 34.2 g/dL (ref 30.0–36.0)
MCV: 88.4 fL (ref 80.0–100.0)
Monocytes Absolute: 0.3 10*3/uL (ref 0.1–1.0)
Monocytes Relative: 3 %
Neutro Abs: 8.5 10*3/uL — ABNORMAL HIGH (ref 1.7–7.7)
Neutrophils Relative %: 68 %
Platelet Count: 265 10*3/uL (ref 150–400)
RBC: 3.18 MIL/uL — ABNORMAL LOW (ref 4.22–5.81)
RDW: 22.3 % — ABNORMAL HIGH (ref 11.5–15.5)
WBC Count: 12.2 10*3/uL — ABNORMAL HIGH (ref 4.0–10.5)
nRBC: 0 % (ref 0.0–0.2)

## 2018-11-29 LAB — RETIC PANEL
Immature Retic Fract: 16.2 % — ABNORMAL HIGH (ref 2.3–15.9)
RBC.: 3.14 MIL/uL — ABNORMAL LOW (ref 4.22–5.81)
Retic Count, Absolute: 88.2 10*3/uL (ref 19.0–186.0)
Retic Ct Pct: 2.8 % (ref 0.4–3.1)
Reticulocyte Hemoglobin: 33 pg (ref >27.9)

## 2018-11-29 MED ORDER — EPOETIN ALFA-EPBX 10000 UNIT/ML IJ SOLN
INTRAMUSCULAR | Status: AC
  Filled 2018-11-29: qty 1

## 2018-11-29 MED ORDER — EPOETIN ALFA-EPBX 10000 UNIT/ML IJ SOLN
10000.0000 [IU] | Freq: Once | INTRAMUSCULAR | Status: AC
  Administered 2018-11-29: 10000 [IU] via SUBCUTANEOUS

## 2018-11-29 MED ORDER — EPOETIN ALFA-EPBX 40000 UNIT/ML IJ SOLN
INTRAMUSCULAR | Status: AC
  Filled 2018-11-29: qty 1

## 2018-11-29 NOTE — Patient Instructions (Signed)
Darbepoetin Alfa injection What is this medicine? DARBEPOETIN ALFA (dar be POE e tin AL fa) helps your body make more red blood cells. It is used to treat anemia caused by chronic kidney failure and chemotherapy. This medicine may be used for other purposes; ask your health care provider or pharmacist if you have questions. COMMON BRAND NAME(S): Aranesp What should I tell my health care provider before I take this medicine? They need to know if you have any of these conditions:  blood clotting disorders or history of blood clots  cancer patient not on chemotherapy  cystic fibrosis  heart disease, such as angina, heart failure, or a history of a heart attack  hemoglobin level of 12 g/dL or greater  high blood pressure  low levels of folate, iron, or vitamin B12  seizures  an unusual or allergic reaction to darbepoetin, erythropoietin, albumin, hamster proteins, latex, other medicines, foods, dyes, or preservatives  pregnant or trying to get pregnant  breast-feeding How should I use this medicine? This medicine is for injection into a vein or under the skin. It is usually given by a health care professional in a hospital or clinic setting. If you get this medicine at home, you will be taught how to prepare and give this medicine. Use exactly as directed. Take your medicine at regular intervals. Do not take your medicine more often than directed. It is important that you put your used needles and syringes in a special sharps container. Do not put them in a trash can. If you do not have a sharps container, call your pharmacist or healthcare provider to get one. A special MedGuide will be given to you by the pharmacist with each prescription and refill. Be sure to read this information carefully each time. Talk to your pediatrician regarding the use of this medicine in children. While this medicine may be used in children as young as 1 month of age for selected conditions, precautions do  apply. Overdosage: If you think you have taken too much of this medicine contact a poison control center or emergency room at once. NOTE: This medicine is only for you. Do not share this medicine with others. What if I miss a dose? If you miss a dose, take it as soon as you can. If it is almost time for your next dose, take only that dose. Do not take double or extra doses. What may interact with this medicine? Do not take this medicine with any of the following medications:  epoetin alfa This list may not describe all possible interactions. Give your health care provider a list of all the medicines, herbs, non-prescription drugs, or dietary supplements you use. Also tell them if you smoke, drink alcohol, or use illegal drugs. Some items may interact with your medicine. What should I watch for while using this medicine? Your condition will be monitored carefully while you are receiving this medicine. You may need blood work done while you are taking this medicine. This medicine may cause a decrease in vitamin B6. You should make sure that you get enough vitamin B6 while you are taking this medicine. Discuss the foods you eat and the vitamins you take with your health care professional. What side effects may I notice from receiving this medicine? Side effects that you should report to your doctor or health care professional as soon as possible:  allergic reactions like skin rash, itching or hives, swelling of the face, lips, or tongue  breathing problems  changes in   vision  chest pain  confusion, trouble speaking or understanding  feeling faint or lightheaded, falls  high blood pressure  muscle aches or pains  pain, swelling, warmth in the leg  rapid weight gain  severe headaches  sudden numbness or weakness of the face, arm or leg  trouble walking, dizziness, loss of balance or coordination  seizures (convulsions)  swelling of the ankles, feet, hands  unusually weak or  tired Side effects that usually do not require medical attention (report to your doctor or health care professional if they continue or are bothersome):  diarrhea  fever, chills (flu-like symptoms)  headaches  nausea, vomiting  redness, stinging, or swelling at site where injected This list may not describe all possible side effects. Call your doctor for medical advice about side effects. You may report side effects to FDA at 1-800-FDA-1088. Where should I keep my medicine? Keep out of the reach of children. Store in a refrigerator between 2 and 8 degrees C (36 and 46 degrees F). Do not freeze. Do not shake. Throw away any unused portion if using a single-dose vial. Throw away any unused medicine after the expiration date. NOTE: This sheet is a summary. It may not cover all possible information. If you have questions about this medicine, talk to your doctor, pharmacist, or health care provider.  2020 Elsevier/Gold Standard (2017-02-02 16:44:20)  

## 2018-12-08 DIAGNOSIS — I82A11 Acute embolism and thrombosis of right axillary vein: Secondary | ICD-10-CM | POA: Diagnosis not present

## 2018-12-08 DIAGNOSIS — N1831 Chronic kidney disease, stage 3a: Secondary | ICD-10-CM | POA: Diagnosis not present

## 2018-12-08 DIAGNOSIS — M25562 Pain in left knee: Secondary | ICD-10-CM | POA: Diagnosis not present

## 2018-12-08 DIAGNOSIS — Z7189 Other specified counseling: Secondary | ICD-10-CM | POA: Diagnosis not present

## 2018-12-08 DIAGNOSIS — M109 Gout, unspecified: Secondary | ICD-10-CM | POA: Diagnosis not present

## 2018-12-29 ENCOUNTER — Other Ambulatory Visit: Payer: Self-pay

## 2018-12-29 ENCOUNTER — Inpatient Hospital Stay: Payer: Medicare Other

## 2018-12-29 ENCOUNTER — Inpatient Hospital Stay: Payer: Medicare Other | Attending: Hematology

## 2018-12-29 VITALS — BP 154/72 | HR 68 | Temp 98.7°F | Resp 18

## 2018-12-29 DIAGNOSIS — D638 Anemia in other chronic diseases classified elsewhere: Secondary | ICD-10-CM

## 2018-12-29 DIAGNOSIS — D469 Myelodysplastic syndrome, unspecified: Secondary | ICD-10-CM

## 2018-12-29 DIAGNOSIS — I129 Hypertensive chronic kidney disease with stage 1 through stage 4 chronic kidney disease, or unspecified chronic kidney disease: Secondary | ICD-10-CM | POA: Insufficient documentation

## 2018-12-29 DIAGNOSIS — D631 Anemia in chronic kidney disease: Secondary | ICD-10-CM | POA: Diagnosis not present

## 2018-12-29 DIAGNOSIS — N183 Chronic kidney disease, stage 3 unspecified: Secondary | ICD-10-CM | POA: Diagnosis not present

## 2018-12-29 DIAGNOSIS — Z79899 Other long term (current) drug therapy: Secondary | ICD-10-CM | POA: Diagnosis not present

## 2018-12-29 LAB — CBC WITH DIFFERENTIAL (CANCER CENTER ONLY)
Abs Immature Granulocytes: 0.35 10*3/uL — ABNORMAL HIGH (ref 0.00–0.07)
Basophils Absolute: 0.2 10*3/uL — ABNORMAL HIGH (ref 0.0–0.1)
Basophils Relative: 1 %
Eosinophils Absolute: 0.2 10*3/uL (ref 0.0–0.5)
Eosinophils Relative: 2 %
HCT: 26.3 % — ABNORMAL LOW (ref 39.0–52.0)
Hemoglobin: 8.9 g/dL — ABNORMAL LOW (ref 13.0–17.0)
Immature Granulocytes: 2 %
Lymphocytes Relative: 14 %
Lymphs Abs: 2.3 10*3/uL (ref 0.7–4.0)
MCH: 29.7 pg (ref 26.0–34.0)
MCHC: 33.8 g/dL (ref 30.0–36.0)
MCV: 87.7 fL (ref 80.0–100.0)
Monocytes Absolute: 0.2 10*3/uL (ref 0.1–1.0)
Monocytes Relative: 1 %
Neutro Abs: 12.5 10*3/uL — ABNORMAL HIGH (ref 1.7–7.7)
Neutrophils Relative %: 80 %
Platelet Count: 221 10*3/uL (ref 150–400)
RBC: 3 MIL/uL — ABNORMAL LOW (ref 4.22–5.81)
RDW: 23.1 % — ABNORMAL HIGH (ref 11.5–15.5)
WBC Count: 15.7 10*3/uL — ABNORMAL HIGH (ref 4.0–10.5)
nRBC: 0 % (ref 0.0–0.2)

## 2018-12-29 LAB — RETIC PANEL
Immature Retic Fract: 17.6 % — ABNORMAL HIGH (ref 2.3–15.9)
RBC.: 2.97 MIL/uL — ABNORMAL LOW (ref 4.22–5.81)
Retic Count, Absolute: 65.6 10*3/uL (ref 19.0–186.0)
Retic Ct Pct: 2.2 % (ref 0.4–3.1)
Reticulocyte Hemoglobin: 33.3 pg (ref >27.9)

## 2018-12-29 MED ORDER — EPOETIN ALFA-EPBX 10000 UNIT/ML IJ SOLN
10000.0000 [IU] | Freq: Once | INTRAMUSCULAR | Status: AC
  Administered 2018-12-29: 15:00:00 10000 [IU] via SUBCUTANEOUS

## 2018-12-29 MED ORDER — EPOETIN ALFA-EPBX 10000 UNIT/ML IJ SOLN
INTRAMUSCULAR | Status: AC
  Filled 2018-12-29: qty 1

## 2018-12-29 NOTE — Patient Instructions (Signed)

## 2019-01-04 DIAGNOSIS — M109 Gout, unspecified: Secondary | ICD-10-CM | POA: Diagnosis not present

## 2019-01-04 DIAGNOSIS — M0579 Rheumatoid arthritis with rheumatoid factor of multiple sites without organ or systems involvement: Secondary | ICD-10-CM | POA: Diagnosis not present

## 2019-01-04 DIAGNOSIS — M25562 Pain in left knee: Secondary | ICD-10-CM | POA: Diagnosis not present

## 2019-01-17 ENCOUNTER — Ambulatory Visit (INDEPENDENT_AMBULATORY_CARE_PROVIDER_SITE_OTHER): Payer: Medicare Other | Admitting: Family Medicine

## 2019-01-17 ENCOUNTER — Other Ambulatory Visit: Payer: Self-pay

## 2019-01-17 ENCOUNTER — Encounter: Payer: Self-pay | Admitting: Family Medicine

## 2019-01-17 VITALS — BP 126/72 | HR 60 | Temp 97.8°F | Wt 189.6 lb

## 2019-01-17 DIAGNOSIS — I2510 Atherosclerotic heart disease of native coronary artery without angina pectoris: Secondary | ICD-10-CM | POA: Diagnosis not present

## 2019-01-17 DIAGNOSIS — F17200 Nicotine dependence, unspecified, uncomplicated: Secondary | ICD-10-CM | POA: Insufficient documentation

## 2019-01-17 DIAGNOSIS — M1A0490 Idiopathic chronic gout, unspecified hand, without tophus (tophi): Secondary | ICD-10-CM | POA: Diagnosis not present

## 2019-01-17 DIAGNOSIS — E118 Type 2 diabetes mellitus with unspecified complications: Secondary | ICD-10-CM

## 2019-01-17 DIAGNOSIS — D638 Anemia in other chronic diseases classified elsewhere: Secondary | ICD-10-CM | POA: Diagnosis not present

## 2019-01-17 DIAGNOSIS — E1169 Type 2 diabetes mellitus with other specified complication: Secondary | ICD-10-CM | POA: Diagnosis not present

## 2019-01-17 DIAGNOSIS — E785 Hyperlipidemia, unspecified: Secondary | ICD-10-CM

## 2019-01-17 DIAGNOSIS — D469 Myelodysplastic syndrome, unspecified: Secondary | ICD-10-CM | POA: Diagnosis not present

## 2019-01-17 LAB — POCT GLYCOSYLATED HEMOGLOBIN (HGB A1C): Hemoglobin A1C: 5.7 % — AB (ref 4.0–5.6)

## 2019-01-17 NOTE — Progress Notes (Signed)
   Subjective:    Patient ID: Paul [name], male    DOB: 1940/08/17, 78 y.o.   MRN: [id number]  HPI He is here for a medication check.  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  His hemoglobin is being monitored carefully.  He does have underlying gout and is presently on Uloric and doing well on that.  He has some been seen by cardiology has had no chest pain, shortness of breath.  He has an underlying history of diabetes but has been off Metformin for a year and a half now.  He is cut down on his cigarettes to only half a pack per day   Review of Systems     Objective:   Physical Exam Alert and in no distress.  Cardiac exam shows regular rhythm without murmurs gallops.  Lungs clear to auscultation. Hemoglobin A1c is 5.7    Assessment & Plan:  Anemia of chronic disease - Plan: CBC with Differential  Controlled type 2 diabetes mellitus with complication, without long-term current use of insulin ([place]) - Plan: HgB A1c  Atherosclerosis of native coronary artery of native heart without angina pectoris - Plan: Lipid panel  MDS/MPN (myelodysplastic/myeloproliferative neoplasms) ([place]) - Plan: CBC with Differential, Comprehensive metabolic panel  Chronic gout of hand, unspecified cause, unspecified laterality - Plan: Uric Acid  Hyperlipidemia associated with type 2 diabetes mellitus ([place]) - Plan: Lipid panel  Current smoker I again encouraged him to quit all smoking.  He will continue to be followed by oncology as well as cardiology and rheumatology.  In general he seems to be doing fairly well.

## 2019-01-18 LAB — COMPREHENSIVE METABOLIC PANEL
ALT: 18 IU/L (ref 0–44)
AST: 29 IU/L (ref 0–40)
Albumin/Globulin Ratio: 0.6 — ABNORMAL LOW (ref 1.2–2.2)
Albumin: 2.9 g/dL — ABNORMAL LOW (ref 3.7–4.7)
Alkaline Phosphatase: 126 IU/L — ABNORMAL HIGH (ref 39–117)
BUN/Creatinine Ratio: 21 (ref 10–24)
BUN: 24 mg/dL (ref 8–27)
Bilirubin Total: 0.7 mg/dL (ref 0.0–1.2)
CO2: 20 mmol/L (ref 20–29)
Calcium: 8.9 mg/dL (ref 8.6–10.2)
Chloride: 103 mmol/L (ref 96–106)
Creatinine, Ser: 1.17 mg/dL (ref 0.76–1.27)
GFR calc Af Amer: 70 mL/min/{1.73_m2} (ref >59)
GFR calc non Af Amer: 60 mL/min/{1.73_m2} (ref >59)
Globulin, Total: 4.5 g/dL (ref 1.5–4.5)
Glucose: 156 mg/dL — ABNORMAL HIGH (ref 65–99)
Potassium: 5.4 mmol/L — ABNORMAL HIGH (ref 3.5–5.2)
Sodium: 134 mmol/L (ref 134–144)
Total Protein: 7.4 g/dL (ref 6.0–8.5)

## 2019-01-18 LAB — CBC WITH DIFFERENTIAL/PLATELET
Basophils Absolute: 0.3 10*3/uL — ABNORMAL HIGH (ref 0.0–0.2)
Basos: 2 %
EOS (ABSOLUTE): 0.2 10*3/uL (ref 0.0–0.4)
Eos: 1 %
Hematocrit: 27.2 % — ABNORMAL LOW (ref 37.5–51.0)
Hemoglobin: 9.4 g/dL — ABNORMAL LOW (ref 13.0–17.7)
Immature Grans (Abs): 0.4 10*3/uL — ABNORMAL HIGH (ref 0.0–0.1)
Immature Granulocytes: 3 %
Lymphocytes Absolute: 3 10*3/uL (ref 0.7–3.1)
Lymphs: 19 %
MCH: 29.1 pg (ref 26.6–33.0)
MCHC: 34.6 g/dL (ref 31.5–35.7)
MCV: 84 fL (ref 79–97)
Monocytes Absolute: 0.5 10*3/uL (ref 0.1–0.9)
Monocytes: 3 %
Neutrophils Absolute: 11.7 10*3/uL — ABNORMAL HIGH (ref 1.4–7.0)
Neutrophils: 72 %
Platelets: 381 10*3/uL (ref 150–450)
RBC: 3.23 x10E6/uL — ABNORMAL LOW (ref 4.14–5.80)
RDW: 22 % — ABNORMAL HIGH (ref 11.6–15.4)
WBC: 16.1 10*3/uL — ABNORMAL HIGH (ref 3.4–10.8)

## 2019-01-18 LAB — LIPID PANEL
Chol/HDL Ratio: 2.8 ratio (ref 0.0–5.0)
Cholesterol, Total: 97 mg/dL — ABNORMAL LOW (ref 100–199)
HDL: 35 mg/dL — ABNORMAL LOW (ref >39)
LDL Chol Calc (NIH): 45 mg/dL (ref 0–99)
Triglycerides: 86 mg/dL (ref 0–149)
VLDL Cholesterol Cal: 17 mg/dL (ref 5–40)

## 2019-01-18 LAB — URIC ACID: Uric Acid: 2.2 mg/dL — ABNORMAL LOW (ref 3.8–8.4)

## 2019-01-19 NOTE — Progress Notes (Signed)
[place]   [phone] [phone]   Clinic Follow up Note   Patient Care Team: Denita Lung, MD as PCP - General (Family Medicine) Burnell Blanks, MD as PCP - Cardiology (Cardiology)  Date of Service:  01/29/2019  CHIEF COMPLAINT: MDS/MPN, with mild leukocytosis and moderate anemia, JAK2 V617F (-)  Clinical summary per [name] 78 year old man I initially saw when he was hospitalized 2 years ago in 2017 with an allergic reaction to allopurinol resulting in drug induced hepatitis and thrombocytopenia. He was on the drug for chronic gout. Gout currently controlled with colchicine and Uloric.I just reevaluated him on September 27, 2017 for acute on chronic anemia, leukocytosis, thrombocytosis, and an unintentional 40 pound weight loss.  Repeat serum protein and immunofixation electrophoresis shows persistent elevation of IgG and IgA but polyclonal not amonoclonalpattern consistent with chronic liver disease.  December 05, 2017 bone marrow aspiration, biopsy, and cytogenetics done as ongoing evaluation of acute on chronic macrocytic anemia with borderline elevation of white count and platelets. No monocytosis. Please see previous notes for complete details of recent evaluation. Bone marrow is hypercellular. Scattered paratrabecular lymphoid aggregates. No monoclonal populations on immunohistochemical staining or flow cytometry. 3% blasts. Rare ring sideroblasts. Dyserythropoiesis. Dysmegakaryopoiesis.No deletion in chromosomes 5 or 7. Loss of Y chromosome which is normal for age. Fairly exhaustive evaluation to exclude occult malignancy has been unrevealing.  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 NEGATIVE for the JAK2 V617F mutation.   CURRENT THERAPY:  Retacrit monthly started 10/30/18. Will increase to every 2 weeks starting 01/29/19  INTERVAL HISTORY:  Paul [name] is  here for a follow up. He was last seen by me in 04/2018. He was seen by NP Laice in interim. He presents to the clinic alone. He notes he is still weak and tired. He notes with start of Retacrit he felt good for a few weeks but then will feel down again. He notes he is willing to come more often for more treatment. He notes he does not do much at home. He mostly sits but can shower as needed. He notes his wife gets the mail and does not walk around due to low energy. He denies depression.    REVIEW OF SYSTEMS:    Constitutional: Denies fevers, chills or abnormal weight loss (+) fatigue and weakness Eyes: Denies blurriness of vision Ears, nose, mouth, throat, and face: Denies mucositis or sore throat Respiratory: Denies cough, dyspnea or wheezes Cardiovascular: Denies palpitation, chest discomfort or lower extremity swelling Gastrointestinal:  Denies nausea, heartburn or change in bowel habits Skin: Denies abnormal skin rashes Lymphatics: Denies new lymphadenopathy or easy bruising Neurological:Denies numbness, tingling or new weaknesses Behavioral/Psych: Mood is stable, no new changes  All other systems were reviewed with the patient and are negative.  MEDICAL HISTORY:  Past Medical History:  Diagnosis Date  . AAA (abdominal aortic aneurysm) ([place])   . Abnormal LFTs   . AKI (acute kidney injury) ([place])   . Anemia of chronic disease   . Arthritis    "all over" (12/04/2015)  . Atherosclerosis of native coronary artery of native heart without angina pectoris   . CAD (coronary artery disease)    with cath 09/26/08 showing severe heart disease in RCA with POBA, mild to mod diesease in circ and LAD  . Chronic back pain    back problems prior to surgery   . Constipation   . Coronary atherosclerosis of native coronary artery   .  Dehydration   . Diabetes mellitus    with neuropathy  . Diarrhea   . Elevated liver enzymes   . Generalized weakness   . Gout of right hand   . Hiatal hernia   .  History of gastroesophageal reflux (GERD)   . History of stomach ulcers   . Hyperlipidemia   . Hypertension   . Metabolic acidosis   . Normochromic anemia 02/23/2016  . 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 Thrombocytopenia ([place])   . Unintentional weight loss 09/27/2017   33 lbs between 4/19 & 8/19  . Vitamin B12 deficiency   . Vitamin B12 deficiency   . Vitamin D deficiency   . Weakness     SURGICAL HISTORY: Past Surgical History:  Procedure Laterality Date  . ANKLE FRACTURE SURGERY Left    "crushed"  . BACK SURGERY  X 3   "fell 4 stories in 1977"  . CARPAL TUNNEL RELEASE Left   . CORONARY ANGIOPLASTY WITH STENT PLACEMENT  09/26/2008   [name] 06/03/2010  . FRACTURE SURGERY    . LEFT HEART CATHETERIZATION WITH CORONARY ANGIOGRAM N/A 01/21/2012   Procedure: LEFT HEART CATHETERIZATION WITH CORONARY ANGIOGRAM;  Surgeon: Burnell Blanks, MD;  Location: [facility] CATH LAB;  Service: Cardiovascular;  Laterality: N/A;  . LUMBAR [place]; ~ 1980  . POSTERIOR LUMBAR FUSION  ~ 1998   "3rd OR to reconstruct my back after fall in 1977"  . 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  ALLERGIES:  is allergic to allopurinol and metformin and related.  MEDICATIONS:  Current Outpatient Medications  Medication Sig Dispense Refill  . acetaminophen (TYLENOL) 500 MG tablet Take 1,000 mg by mouth every 6 (six) hours as needed.    Marland Kitchen amLODipine (NORVASC) 5 MG tablet Take 1 tablet (5 mg total) by mouth daily. 90 tablet 3  . aspirin 81 MG chewable tablet Chew 81 mg by mouth daily.    . clopidogrel (PLAVIX) 75 MG tablet TAKE 1 TABLET BY MOUTH  DAILY 90 tablet 3  . Febuxostat 80 MG TABS Take 40 mg by mouth daily.     . fish oil-omega-3 fatty acids 1000 MG capsule Take 1 g by mouth 2 (two) times daily.     Marland Kitchen glucose blood (ACCU-CHEK AVIVA PLUS) test strip Test once to twice daily 200  each 4  . hydroxypropyl methylcellulose / hypromellose (ISOPTO TEARS / GONIOVISC) 2.5 % ophthalmic solution Place 1 drop into both eyes as needed for dry eyes.    . isosorbide mononitrate (IMDUR) 30 MG 24 hr tablet Take 1 tablet (30 mg total) by mouth 2 (two) times daily. 180 tablet 3  . metoprolol tartrate (LOPRESSOR) 25 MG tablet Take 1 tablet (25 mg total) by mouth 2 (two) times daily. 180 tablet 3  . Multiple Vitamins-Minerals (CENTRUM) tablet Take 1 tablet by mouth daily.    . nitroGLYCERIN (NITROSTAT) 0.4 MG SL tablet Place 0.4 mg under the tongue every 5 (five) minutes as needed for chest pain. For 3 doses    . pyridoxine (B-6) 100 MG tablet Take 100 mg by mouth daily.    . simvastatin (ZOCOR) 20 MG tablet Take 1 tablet (20 mg total) by mouth at bedtime. 90 tablet 3   No current facility-administered medications for this visit.    PHYSICAL EXAMINATION: ECOG PERFORMANCE STATUS: 2 - Symptomatic, <50% confined to  bed  Vitals:   01/29/19 1337  BP: (!) 140/53  Pulse: 72  Resp: 17  Temp: 98.3 F (36.8 C)  SpO2: 98%   Filed Weights   01/29/19 1337  Weight: 187 lb 11.2 oz (85.1 kg)    Due to COVID19 we will limit examination to appearance. Patient had no complaints.  GENERAL:alert, no distress and comfortable SKIN: skin color normal, no rashes or significant lesions EYES: normal, Conjunctiva are pink and non-injected, sclera clear  NEURO: alert & oriented x 3 with fluent speech   LABORATORY DATA:  I have reviewed the data as listed CBC Latest Ref Rng & Units 01/29/2019 01/17/2019 12/29/2018  WBC 4.0 - 10.5 K/uL 15.9(H) 16.1(H) 15.7(H)  Hemoglobin 13.0 - 17.0 g/dL 9.2(L) 9.4(L) 8.9(L)  Hematocrit 39.0 - 52.0 % 27.4(L) 27.2(L) 26.3(L)  Platelets 150 - 400 K/uL 359 381 221     CMP Latest Ref Rng & Units 01/17/2019 09/02/2018 09/02/2018  Glucose 65 - 99 mg/dL 156(H) - 154(H)  BUN 8 - 27 mg/dL 24 - 21  Creatinine 0.76 - 1.27 mg/dL 1.17 1.24 1.14  Sodium 134 - 144 mmol/L 134 -  136  Potassium 3.5 - 5.2 mmol/L 5.4(H) - 3.8  Chloride 96 - 106 mmol/L 103 - 108  CO2 20 - 29 mmol/L 20 - 21(L)  Calcium 8.6 - 10.2 mg/dL 8.9 - 8.8(L)  Total Protein 6.0 - 8.5 g/dL 7.4 - 7.1  Total Bilirubin 0.0 - 1.2 mg/dL 0.7 - 0.9  Alkaline Phos 39 - 117 IU/L 126(H) - 71  AST 0 - 40 IU/L 29 - 29  ALT 0 - 44 IU/L 18 - 21      RADIOGRAPHIC STUDIES: I have personally reviewed the radiological images as listed and agreed with the findings in the report. No results found.   ASSESSMENT & PLAN:  Paul [name] is a 78 y.o. male with    1. MDS/MPN, JAK2 (-), cytogenetics normal  -He has had anemia issues since mid 2017 and Hg has in 9-10 range most of time, he also has mild leukocytosis, thrombocytosis resolved now -Other contributions to his anemia include stage III chronic renal insufficiency and chronic gouty arthritis. Previous iron study and B12 level were normal. 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  -His leukocytosis is very mild, no thrombocytosis, risk of thrombosis is not very high, no need treatment such as hydrea. He is currently on aspirin and Plavix, which also helps to prevent thrombosis.  -He was on oral iron, but stopped due to constipation. -His anemia has worsened so I started him on treatment with monthly Retacrit beginning 10/30/18. Aranesp was not approved by his insurance.  -S/p 3 doses he notes this has helped but doe snot last long. After a 2-3 weeks he is back to feeling very fatigued and weak like he does today. I will increase Retacrit to every 2 weeks starting today. If not adequate, will increase dosage in future. Labs reviewed, Hg 9.2 today, will proceed with injection.  -F/u in 6 weeks    2. 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 He currently  smokes 1/2ppd.  -I reviewed his CT chest from 11/2017 with pt in person which shows mild emphysema and small right pleural effusion, pulmonary parenchymal fibrosis, probable UIP.  strongly encouraged him to stop  smoking to prevent further exacerbation and may need oxygen.  -I recommend he bee seen by Pulmonology for further workup. He will think about it    4. AAA, CAD with stent placed -He notes his AAA has decreased and is being followed by Dr. Doren Custard, vascular surgeon. -He does have blockage in heart  -On Plavix and ASA  5. Mild liver cirrhosis  -He has long intermittent history of moderate alcohol drinking  -CT AP from 10/2017 showed nodularity suspicious of cirrhosis.  -No hepatomegaly on exam  -monitor LFTs   6. H/o of right LE DVT, provoked  -Treated with 3 months of Xarelto, off now   PLAN:  -Proceed with Retacrit today, will increase to every 2 weeks   -Lab and Retacrit every 2 weeks x3 -F/u in 6 weeks to see if he needs higher dosage    No problem-specific Assessment & Plan notes found for this encounter.   No orders of the defined types were placed in this encounter.  All questions were answered. The patient knows to call the clinic with any problems, questions or concerns. No barriers to learning was detected. I spent 15 minutes counseling the patient face to face. The total time spent in the appointment was 20 minutes and more than 50% was on counseling and review of test results     Joslyn Devon 01/29/2019   I, Joslyn Devon, am acting as scribe for Truitt Merle, MD.   I have reviewed the above documentation for accuracy and completeness, and I agree with the above.

## 2019-01-29 ENCOUNTER — Other Ambulatory Visit: Payer: Self-pay

## 2019-01-29 ENCOUNTER — Inpatient Hospital Stay: Payer: Medicare Other | Attending: Hematology

## 2019-01-29 ENCOUNTER — Inpatient Hospital Stay (HOSPITAL_BASED_OUTPATIENT_CLINIC_OR_DEPARTMENT_OTHER): Payer: Medicare Other | Admitting: Hematology

## 2019-01-29 ENCOUNTER — Inpatient Hospital Stay: Payer: Medicare Other

## 2019-01-29 VITALS — BP 140/53 | HR 72 | Temp 98.3°F | Resp 17 | Ht 70.0 in | Wt 187.7 lb

## 2019-01-29 DIAGNOSIS — J439 Emphysema, unspecified: Secondary | ICD-10-CM | POA: Diagnosis not present

## 2019-01-29 DIAGNOSIS — D469 Myelodysplastic syndrome, unspecified: Secondary | ICD-10-CM | POA: Diagnosis not present

## 2019-01-29 DIAGNOSIS — I129 Hypertensive chronic kidney disease with stage 1 through stage 4 chronic kidney disease, or unspecified chronic kidney disease: Secondary | ICD-10-CM | POA: Insufficient documentation

## 2019-01-29 DIAGNOSIS — K7460 Unspecified cirrhosis of liver: Secondary | ICD-10-CM | POA: Insufficient documentation

## 2019-01-29 DIAGNOSIS — E785 Hyperlipidemia, unspecified: Secondary | ICD-10-CM | POA: Diagnosis not present

## 2019-01-29 DIAGNOSIS — R531 Weakness: Secondary | ICD-10-CM | POA: Insufficient documentation

## 2019-01-29 DIAGNOSIS — D638 Anemia in other chronic diseases classified elsewhere: Secondary | ICD-10-CM

## 2019-01-29 DIAGNOSIS — E538 Deficiency of other specified B group vitamins: Secondary | ICD-10-CM | POA: Diagnosis not present

## 2019-01-29 DIAGNOSIS — I714 Abdominal aortic aneurysm, without rupture: Secondary | ICD-10-CM | POA: Diagnosis not present

## 2019-01-29 DIAGNOSIS — M1990 Unspecified osteoarthritis, unspecified site: Secondary | ICD-10-CM | POA: Diagnosis not present

## 2019-01-29 DIAGNOSIS — F17210 Nicotine dependence, cigarettes, uncomplicated: Secondary | ICD-10-CM | POA: Insufficient documentation

## 2019-01-29 DIAGNOSIS — Z79899 Other long term (current) drug therapy: Secondary | ICD-10-CM | POA: Diagnosis not present

## 2019-01-29 DIAGNOSIS — K5900 Constipation, unspecified: Secondary | ICD-10-CM | POA: Insufficient documentation

## 2019-01-29 DIAGNOSIS — I2510 Atherosclerotic heart disease of native coronary artery without angina pectoris: Secondary | ICD-10-CM | POA: Diagnosis not present

## 2019-01-29 DIAGNOSIS — E119 Type 2 diabetes mellitus without complications: Secondary | ICD-10-CM | POA: Insufficient documentation

## 2019-01-29 DIAGNOSIS — M109 Gout, unspecified: Secondary | ICD-10-CM | POA: Insufficient documentation

## 2019-01-29 DIAGNOSIS — G629 Polyneuropathy, unspecified: Secondary | ICD-10-CM | POA: Insufficient documentation

## 2019-01-29 LAB — CBC WITH DIFFERENTIAL (CANCER CENTER ONLY)
Abs Immature Granulocytes: 0.6 10*3/uL — ABNORMAL HIGH (ref 0.00–0.07)
Basophils Absolute: 0.3 10*3/uL — ABNORMAL HIGH (ref 0.0–0.1)
Basophils Relative: 2 %
Eosinophils Absolute: 0.5 10*3/uL (ref 0.0–0.5)
Eosinophils Relative: 3 %
HCT: 27.4 % — ABNORMAL LOW (ref 39.0–52.0)
Hemoglobin: 9.2 g/dL — ABNORMAL LOW (ref 13.0–17.0)
Immature Granulocytes: 4 %
Lymphocytes Relative: 16 %
Lymphs Abs: 2.5 10*3/uL (ref 0.7–4.0)
MCH: 29.3 pg (ref 26.0–34.0)
MCHC: 33.6 g/dL (ref 30.0–36.0)
MCV: 87.3 fL (ref 80.0–100.0)
Monocytes Absolute: 0.4 10*3/uL (ref 0.1–1.0)
Monocytes Relative: 3 %
Neutro Abs: 11.5 10*3/uL — ABNORMAL HIGH (ref 1.7–7.7)
Neutrophils Relative %: 72 %
Platelet Count: 359 10*3/uL (ref 150–400)
RBC: 3.14 MIL/uL — ABNORMAL LOW (ref 4.22–5.81)
RDW: 22.1 % — ABNORMAL HIGH (ref 11.5–15.5)
WBC Count: 15.9 10*3/uL — ABNORMAL HIGH (ref 4.0–10.5)
nRBC: 0 % (ref 0.0–0.2)

## 2019-01-29 LAB — RETIC PANEL
Immature Retic Fract: 14.8 % (ref 2.3–15.9)
RBC.: 3.15 MIL/uL — ABNORMAL LOW (ref 4.22–5.81)
Retic Count, Absolute: 91.7 10*3/uL (ref 19.0–186.0)
Retic Ct Pct: 2.9 % (ref 0.4–3.1)
Reticulocyte Hemoglobin: 32.4 pg (ref >27.9)

## 2019-01-29 LAB — FERRITIN: Ferritin: 227 ng/mL (ref 24–336)

## 2019-01-29 MED ORDER — EPOETIN ALFA-EPBX 10000 UNIT/ML IJ SOLN
10000.0000 [IU] | Freq: Once | INTRAMUSCULAR | Status: AC
  Administered 2019-01-29: 10000 [IU] via SUBCUTANEOUS

## 2019-01-29 MED ORDER — EPOETIN ALFA-EPBX 10000 UNIT/ML IJ SOLN
INTRAMUSCULAR | Status: AC
  Filled 2019-01-29: qty 1

## 2019-01-29 NOTE — Patient Instructions (Signed)

## 2019-01-30 ENCOUNTER — Telehealth: Payer: Self-pay | Admitting: Hematology

## 2019-01-30 NOTE — Telephone Encounter (Signed)
Scheduled appt per 12/28 los.  Spoke with pt and he is aware of the appt date and time.

## 2019-01-31 ENCOUNTER — Encounter: Payer: Self-pay | Admitting: Hematology

## 2019-02-12 ENCOUNTER — Inpatient Hospital Stay: Payer: Medicare Other

## 2019-02-12 ENCOUNTER — Inpatient Hospital Stay: Payer: Medicare Other | Attending: Hematology

## 2019-02-12 ENCOUNTER — Other Ambulatory Visit: Payer: Self-pay

## 2019-02-12 VITALS — BP 151/44 | HR 65 | Temp 97.0°F | Resp 18

## 2019-02-12 DIAGNOSIS — I129 Hypertensive chronic kidney disease with stage 1 through stage 4 chronic kidney disease, or unspecified chronic kidney disease: Secondary | ICD-10-CM | POA: Insufficient documentation

## 2019-02-12 DIAGNOSIS — D631 Anemia in chronic kidney disease: Secondary | ICD-10-CM | POA: Diagnosis not present

## 2019-02-12 DIAGNOSIS — D469 Myelodysplastic syndrome, unspecified: Secondary | ICD-10-CM

## 2019-02-12 DIAGNOSIS — N189 Chronic kidney disease, unspecified: Secondary | ICD-10-CM | POA: Insufficient documentation

## 2019-02-12 DIAGNOSIS — D638 Anemia in other chronic diseases classified elsewhere: Secondary | ICD-10-CM

## 2019-02-12 DIAGNOSIS — Z79899 Other long term (current) drug therapy: Secondary | ICD-10-CM | POA: Insufficient documentation

## 2019-02-12 LAB — CBC WITH DIFFERENTIAL (CANCER CENTER ONLY)
Abs Immature Granulocytes: 0.84 10*3/uL — ABNORMAL HIGH (ref 0.00–0.07)
Basophils Absolute: 0.3 10*3/uL — ABNORMAL HIGH (ref 0.0–0.1)
Basophils Relative: 2 %
Eosinophils Absolute: 0.7 10*3/uL — ABNORMAL HIGH (ref 0.0–0.5)
Eosinophils Relative: 4 %
HCT: 25.8 % — ABNORMAL LOW (ref 39.0–52.0)
Hemoglobin: 8.6 g/dL — ABNORMAL LOW (ref 13.0–17.0)
Immature Granulocytes: 5 %
Lymphocytes Relative: 16 %
Lymphs Abs: 2.6 10*3/uL (ref 0.7–4.0)
MCH: 29 pg (ref 26.0–34.0)
MCHC: 33.3 g/dL (ref 30.0–36.0)
MCV: 86.9 fL (ref 80.0–100.0)
Monocytes Absolute: 0.3 10*3/uL (ref 0.1–1.0)
Monocytes Relative: 2 %
Neutro Abs: 11.2 10*3/uL — ABNORMAL HIGH (ref 1.7–7.7)
Neutrophils Relative %: 71 %
Platelet Count: 234 10*3/uL (ref 150–400)
RBC: 2.97 MIL/uL — ABNORMAL LOW (ref 4.22–5.81)
RDW: 21.8 % — ABNORMAL HIGH (ref 11.5–15.5)
WBC Count: 15.8 10*3/uL — ABNORMAL HIGH (ref 4.0–10.5)
nRBC: 0 % (ref 0.0–0.2)

## 2019-02-12 LAB — RETIC PANEL
Immature Retic Fract: 20 % — ABNORMAL HIGH (ref 2.3–15.9)
RBC.: 2.94 MIL/uL — ABNORMAL LOW (ref 4.22–5.81)
Retic Count, Absolute: 98.2 10*3/uL (ref 19.0–186.0)
Retic Ct Pct: 3.3 % — ABNORMAL HIGH (ref 0.4–3.1)
Reticulocyte Hemoglobin: 32.4 pg (ref >27.9)

## 2019-02-12 MED ORDER — EPOETIN ALFA-EPBX 10000 UNIT/ML IJ SOLN
INTRAMUSCULAR | Status: AC
  Filled 2019-02-12: qty 1

## 2019-02-12 MED ORDER — EPOETIN ALFA-EPBX 10000 UNIT/ML IJ SOLN
10000.0000 [IU] | Freq: Once | INTRAMUSCULAR | Status: AC
  Administered 2019-02-12: 10000 [IU] via SUBCUTANEOUS

## 2019-02-13 ENCOUNTER — Telehealth: Payer: Self-pay

## 2019-02-13 ENCOUNTER — Other Ambulatory Visit: Payer: Self-pay | Admitting: Hematology

## 2019-02-13 NOTE — Telephone Encounter (Signed)
Left voice message for patient regarding lab results.  Per Dr. Burr Medico she plans to increase his Retacrit dose with his next injection due to his persistent moderate anemia.  I encouraged him to call back if he has any questions regarding this message.

## 2019-02-13 NOTE — Progress Notes (Signed)
Chief Complaint  Patient presents with  . Follow-up    CAD     History of Present Illness: 79 yo male with history of CAD, DM, bilateral carotid artery disease, HTN, HLD and DVT here today for cardiology follow up. His echo in 2009 showed mild LVH and normal LV function with no significant valvular abnormalities. His cath in 2009 showed a near total occlusion of the RCA beyond the stent in the mid portion. I attempted PCI and was able to balloon the mid lesion but could never pass a balloon distally. He did well after that.  Cardiac cath in December 2013 in the setting of worsened chest pain/dsyspnea showed total occlusion of the RCA which was not amenable to PCI (PCI attempted in 2009). The LAD and Circumflex had moderate disease. Normal ABI 10/01/13. Carotid artery dopplers July 2018 with stable moderate bilateral ICA stenosis. He was found to have a DVT in November 2017 and was treated with Xarelto. He is off of Xarelto and back on ASA and Plavix. He has been followed by in the Heme/Onc clinic for unintentional weight loss and chronic macrocytic anemia. Malignancy workup was negative. Chest pain in August 2020 and admitted overnight with negative troponin. His chest pain occurred after eating two burgers. Echo 09/02/18 with LVEF=60-65%. Moderate AI. Mild AS. I saw him in October 2020 and he c/o chest pain at night and worsened when he was anemic.   He is here today for follow up. He has no exertional chest pain. He continues to have resting chest pain at night when lying in bed. Worse when Hgb is down. Recent increase in dose of his Retacrit. He denies any dyspnea, palpitations, lower extremity edema, orthopnea, PND, dizziness, near syncope or syncope.   Primary Care Physician: Denita Lung, MD   Past Medical History:  Diagnosis Date  . AAA (abdominal aortic aneurysm) ([place])   . Abnormal LFTs   . AKI (acute kidney injury) ([place])   . Anemia of chronic disease   . Arthritis    "all over"  (12/04/2015)  . Atherosclerosis of native coronary artery of native heart without angina pectoris   . CAD (coronary artery disease)    with cath 09/26/08 showing severe heart disease in RCA with POBA, mild to mod diesease in circ and LAD  . Chronic back pain    back problems prior to surgery   . Constipation   . Coronary atherosclerosis of native coronary artery   . Dehydration   . Diabetes mellitus    with neuropathy  . Diarrhea   . Elevated liver enzymes   . Generalized weakness   . Gout of right hand   . Hiatal hernia   . History of gastroesophageal reflux (GERD)   . History of stomach ulcers   . Hyperlipidemia   . Hypertension   . Metabolic acidosis   . Normochromic anemia 02/23/2016  . 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 Thrombocytopenia ([place])   . Unintentional weight loss 09/27/2017   33 lbs between 4/19 & 8/19  . Vitamin B12 deficiency   . Vitamin B12 deficiency   . Vitamin D deficiency   . Weakness     Past Surgical History:  Procedure Laterality Date  . ANKLE FRACTURE SURGERY Left    "crushed"  . BACK SURGERY  X 3   "fell 4 stories in 1977"  . CARPAL TUNNEL RELEASE Left   . CORONARY ANGIOPLASTY WITH STENT PLACEMENT  09/26/2008   [name] 06/03/2010  . FRACTURE  SURGERY    . LEFT HEART CATHETERIZATION WITH CORONARY ANGIOGRAM N/A 01/21/2012   Procedure: LEFT HEART CATHETERIZATION WITH CORONARY ANGIOGRAM;  Surgeon: Burnell Blanks, MD;  Location: [facility] CATH LAB;  Service: Cardiovascular;  Laterality: N/A;  . LUMBAR [place]; ~ 1980  . POSTERIOR LUMBAR FUSION  ~ 1998   "3rd OR to reconstruct my back after fall in 1977"  . 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 acetaminophen (TYLENOL) 500 MG tablet Take 1,000 mg by mouth every 6 (six) hours as needed.    Marland Kitchen amLODipine (NORVASC) 5 MG tablet Take 1 tablet (5 mg total) by mouth daily. 90 tablet 3  . aspirin 81 MG chewable tablet Chew 81 mg by  mouth daily.    . clopidogrel (PLAVIX) 75 MG tablet TAKE 1 TABLET BY MOUTH  DAILY 90 tablet 3  . febuxostat (ULORIC) 40 MG tablet Take 40 mg by mouth daily.    . fish oil-omega-3 fatty acids 1000 MG capsule Take 1 g by mouth 2 (two) times daily.     Marland Kitchen glucose blood (ACCU-CHEK AVIVA PLUS) test strip Test once to twice daily 200 each 4  . hydroxypropyl methylcellulose / hypromellose (ISOPTO TEARS / GONIOVISC) 2.5 % ophthalmic solution Place 1 drop into both eyes as needed for dry eyes.    . isosorbide mononitrate (IMDUR) 30 MG 24 hr tablet Take one tablet by mouth every morning, take two tablets by mouth every evening 270 tablet 3  . metoprolol tartrate (LOPRESSOR) 25 MG tablet Take 1 tablet (25 mg total) by mouth 2 (two) times daily. 180 tablet 3  . Multiple Vitamins-Minerals (CENTRUM) tablet Take 1 tablet by mouth daily.    . nitroGLYCERIN (NITROSTAT) 0.4 MG SL tablet Place 0.4 mg under the tongue every 5 (five) minutes as needed for chest pain. For 3 doses    . pyridoxine (B-6) 100 MG tablet Take 100 mg by mouth daily.    . simvastatin (ZOCOR) 20 MG tablet Take 1 tablet (20 mg total) by mouth at bedtime. 90 tablet 3   No current facility-administered medications for this visit.    Allergies  Allergen Reactions  . Allopurinol Other (See Comments)    thrombocytopenia  . Metformin And Related Diarrhea    Social History   Socioeconomic History  . Marital status: Married    Spouse name: Scientist, physiological  . Number of children: 0  . Years of education: Not on file  . Highest education level: Not on file  Occupational History  . Not on file  Tobacco Use  . Smoking status: Current Every Day Smoker    Packs/day: 0.50    Years: 20.00    Pack years: 10.00    Types: Cigars    Last attempt to quit: 01/24/2013    Years since quitting: 6.0  . Smokeless tobacco: Never Used  Substance and Sexual Activity  . Alcohol use: Not Currently  . Drug use: No  . Sexual activity: Not on file  Other Topics  Concern  . Not on file  Social History Narrative   Divorced, 1 child.    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    Owned roofing co. In [place]   Social Determinants of Health   Financial Resource Strain:   . Difficulty of Paying Living Expenses: Not on file  Food Insecurity:   . Worried About [employer] of  Food in the Last Year: Not on file  . Ran Out of Food in the Last Year: Not on file  Transportation Needs:   . Lack of Transportation (Medical): Not on file  . Lack of Transportation (Non-Medical): Not on file  Physical Activity:   . Days of Exercise per Week: Not on file  . Minutes of Exercise per Session: Not on file  Stress:   . Feeling of Stress : Not on file  Social Connections:   . Frequency of Communication with Friends and Family: Not on file  . Frequency of Social Gatherings with Friends and Family: Not on file  . Attends Religious Services: Not on file  . Active Member of Clubs or Organizations: Not on file  . Attends Archivist Meetings: Not on file  . Marital Status: Not on file  Intimate Partner Violence:   . Fear of Current or Ex-Partner: Not on file  . Emotionally Abused: Not on file  . Physically Abused: Not on file  . Sexually Abused: Not on file    Family History  Problem Relation Age of Onset  . Heart attack Mother   . Cancer Mother   . Heart disease Mother   . Heart attack Sister   . Heart disease Sister     Review of Systems:  As stated in the HPI and otherwise negative.   BP 124/78   Pulse (!) 57   Ht 5\' 10"  (1.778 m)   Wt 193 lb 3.2 oz (87.6 kg)   SpO2 98%   BMI 27.72 kg/m   Physical Examination: General: Well developed, well nourished, NAD  HEENT: OP clear, mucus membranes moist  SKIN: warm, dry. No rashes. Neuro: No focal deficits  Musculoskeletal: Muscle strength 5/5 all ext  Psychiatric: Mood and affect normal  Neck: No JVD, no carotid bruits, no thyromegaly, no lymphadenopathy.    Lungs:Clear bilaterally, no wheezes, rhonci, crackles Cardiovascular: Regular rate and rhythm. No murmurs, gallops or rubs. Abdomen:Soft. Bowel sounds present. Non-tender.  Extremities: No lower extremity edema. Pulses are 2 + in the bilateral DP/PT.  Echo August 2020:  1. The left ventricle has normal systolic function with an ejection fraction of 60-65%. The cavity size was normal. There is moderately increased left ventricular wall thickness. Left ventricular diastolic Doppler parameters are consistent with impaired  relaxation. Elevated mean left atrial pressure.  2. The right ventricle has normal systolic function. The cavity was normal. There is no increase in right ventricular wall thickness.  3. Left atrial size was mildly dilated.  4. No evidence of mitral valve stenosis.  5. The aortic valve has an indeterminate number of cusps. Severely thickening of the aortic valve. Severe calcifcation of the aortic valve. Aortic valve regurgitation is moderate by color flow Doppler. Mild stenosis of the aortic valve. Severe aortic  annular calcification noted.  6. The aorta is normal in size and structure.  7. The aortic root is normal in size and structure.  8. Pulmonary hypertension is indeterminate, inadequate TR jet.  EKG:  EKG is not ordered today. The ekg ordered today demonstrates   Recent Labs: 01/17/2019: ALT 18; BUN 24; Creatinine, Ser 1.17; Potassium 5.4; Sodium 134 02/12/2019: Hemoglobin 8.6; Platelet Count 234   Lipid Panel    Component Value Date/Time   CHOL 97 (L) 01/17/2019 1500   TRIG 86 01/17/2019 1500   HDL 35 (L) 01/17/2019 1500   CHOLHDL 2.8 01/17/2019 1500   CHOLHDL 3.6 03/03/2015 0001   VLDL 27 03/03/2015  0001   LDLCALC 45 01/17/2019 1500     Wt Readings from Last 3 Encounters:  02/14/19 193 lb 3.2 oz (87.6 kg)  01/29/19 187 lb 11.2 oz (85.1 kg)  01/17/19 189 lb 9.6 oz (86 kg)     Other studies Reviewed: Additional studies/ records that were reviewed  today include: . Review of the above records demonstrates:    Assessment and Plan:   1. CAD with chronic stable angina: He has no exertional chest pain. He continues to have resting chest pain at night when lying in bed. Worse when Hgb is down. Recent increase in dose of his Retacrit. His RCA is known to be occluded and not amenable to PCI. There is moderate disease in the LAD and Circumflex by cath in 2013. LV function normal by echo in August 2020. Continue ASA, Plavix, statin, beta blocker and Imdur. Will increase Imdur to 30 mg in the am and 60 mg in the pm.   2. HTN: BP is better controlled on Norvasc. No changes  3. Carotid artery disease: Moderate bilateral ICA stenosis, stable by dopplers July 2018. He does not wish to repeat now.   4. Hyperlipidemia: Lipids well controlled in December 2020. Continue statin.    5. Aortic valve disease: Mild AS and moderate by echo August 2020.   Current medicines are reviewed at length with the patient today.  The patient does not have concerns regarding medicines.  The following changes have been made:  no change  Labs/ tests ordered today include:   No orders of the defined types were placed in this encounter.   Disposition:   FU with me in 12 months  Signed, Lauree Chandler, MD 02/14/2019 11:40 AM    [facility] [place], [place], [place]  [address] Phone: [phone]; Fax: [phone]

## 2019-02-14 ENCOUNTER — Ambulatory Visit: Payer: Medicare Other | Admitting: Cardiovascular Disease

## 2019-02-14 ENCOUNTER — Encounter: Payer: Self-pay | Admitting: Cardiovascular Disease

## 2019-02-14 ENCOUNTER — Other Ambulatory Visit: Payer: Self-pay

## 2019-02-14 VITALS — BP 124/78 | HR 57 | Ht 70.0 in | Wt 193.2 lb

## 2019-02-14 DIAGNOSIS — I6523 Occlusion and stenosis of bilateral carotid arteries: Secondary | ICD-10-CM | POA: Diagnosis not present

## 2019-02-14 DIAGNOSIS — I351 Nonrheumatic aortic (valve) insufficiency: Secondary | ICD-10-CM | POA: Diagnosis not present

## 2019-02-14 DIAGNOSIS — E785 Hyperlipidemia, unspecified: Secondary | ICD-10-CM | POA: Diagnosis not present

## 2019-02-14 DIAGNOSIS — I10 Essential (primary) hypertension: Secondary | ICD-10-CM

## 2019-02-14 DIAGNOSIS — I25118 Atherosclerotic heart disease of native coronary artery with other forms of angina pectoris: Secondary | ICD-10-CM | POA: Diagnosis not present

## 2019-02-14 MED ORDER — ISOSORBIDE MONONITRATE ER 30 MG PO TB24: ORAL_TABLET | ORAL | 3 refills | Status: DC

## 2019-02-14 MED ORDER — AMLODIPINE BESYLATE 5 MG PO TABS: 5.0000 mg | ORAL_TABLET | Freq: Every day | ORAL | 3 refills | Status: DC

## 2019-02-14 NOTE — Patient Instructions (Signed)
Medication Instructions:  Your physician has recommended you make the following change in your medication:  1.) change isosorbide 30 mg (IMDUR) to --one table every AM; two tablets      every PM  *If you need a refill on your cardiac medications before your next appointment, please call your pharmacy*  Lab Work: none If you have labs (blood work) drawn today and your tests are completely normal, you will receive your results only by: Marland Kitchen MyChart Message (if you have MyChart) OR . A paper copy in the mail If you have any lab test that is abnormal or we need to change your treatment, we will call you to review the results.  Testing/Procedures: none  Follow-Up: At [facility], you and your health needs are our priority.  As part of our continuing mission to provide you with exceptional heart care, we have created designated Provider Care Teams.  These Care Teams include your primary Cardiologist (physician) and Advanced Practice Providers (APPs -  Physician Assistants and Nurse Practitioners) who all work together to provide you with the care you need, when you need it.  Your next appointment:   3 month(s)  The format for your next appointment:   Either In Person or Virtual  Provider:   You may see Lauree Chandler, MD or one of the following Advanced Practice Providers on your designated Care Team:    Melina Copa, PA-C  Ermalinda Barrios, PA-C   Other Instructions

## 2019-02-26 ENCOUNTER — Inpatient Hospital Stay: Payer: Medicare Other

## 2019-02-26 ENCOUNTER — Other Ambulatory Visit: Payer: Self-pay

## 2019-02-26 VITALS — BP 142/56 | HR 60 | Temp 98.5°F | Resp 18

## 2019-02-26 DIAGNOSIS — D469 Myelodysplastic syndrome, unspecified: Secondary | ICD-10-CM

## 2019-02-26 DIAGNOSIS — I129 Hypertensive chronic kidney disease with stage 1 through stage 4 chronic kidney disease, or unspecified chronic kidney disease: Secondary | ICD-10-CM | POA: Diagnosis not present

## 2019-02-26 DIAGNOSIS — D638 Anemia in other chronic diseases classified elsewhere: Secondary | ICD-10-CM

## 2019-02-26 DIAGNOSIS — Z79899 Other long term (current) drug therapy: Secondary | ICD-10-CM | POA: Diagnosis not present

## 2019-02-26 DIAGNOSIS — D631 Anemia in chronic kidney disease: Secondary | ICD-10-CM | POA: Diagnosis not present

## 2019-02-26 DIAGNOSIS — N189 Chronic kidney disease, unspecified: Secondary | ICD-10-CM | POA: Diagnosis not present

## 2019-02-26 LAB — RETIC PANEL
Immature Retic Fract: 12.5 % (ref 2.3–15.9)
RBC.: 3.22 MIL/uL — ABNORMAL LOW (ref 4.22–5.81)
Retic Count, Absolute: 97.9 10*3/uL (ref 19.0–186.0)
Retic Ct Pct: 3 % (ref 0.4–3.1)
Reticulocyte Hemoglobin: 31.9 pg (ref >27.9)

## 2019-02-26 LAB — CBC WITH DIFFERENTIAL (CANCER CENTER ONLY)
Abs Immature Granulocytes: 0.53 10*3/uL — ABNORMAL HIGH (ref 0.00–0.07)
Basophils Absolute: 0.3 10*3/uL — ABNORMAL HIGH (ref 0.0–0.1)
Basophils Relative: 1 %
Eosinophils Absolute: 0.6 10*3/uL — ABNORMAL HIGH (ref 0.0–0.5)
Eosinophils Relative: 3 %
HCT: 27.7 % — ABNORMAL LOW (ref 39.0–52.0)
Hemoglobin: 9.3 g/dL — ABNORMAL LOW (ref 13.0–17.0)
Immature Granulocytes: 3 %
Lymphocytes Relative: 16 %
Lymphs Abs: 3.1 10*3/uL (ref 0.7–4.0)
MCH: 28.7 pg (ref 26.0–34.0)
MCHC: 33.6 g/dL (ref 30.0–36.0)
MCV: 85.5 fL (ref 80.0–100.0)
Monocytes Absolute: 0.6 10*3/uL (ref 0.1–1.0)
Monocytes Relative: 3 %
Neutro Abs: 13.7 10*3/uL — ABNORMAL HIGH (ref 1.7–7.7)
Neutrophils Relative %: 74 %
Platelet Count: 311 10*3/uL (ref 150–400)
RBC: 3.24 MIL/uL — ABNORMAL LOW (ref 4.22–5.81)
RDW: 20.9 % — ABNORMAL HIGH (ref 11.5–15.5)
WBC Count: 18.7 10*3/uL — ABNORMAL HIGH (ref 4.0–10.5)
nRBC: 0 % (ref 0.0–0.2)

## 2019-02-26 MED ORDER — EPOETIN ALFA-EPBX 10000 UNIT/ML IJ SOLN
INTRAMUSCULAR | Status: AC
  Filled 2019-02-26: qty 2

## 2019-02-26 MED ORDER — EPOETIN ALFA-EPBX 10000 UNIT/ML IJ SOLN
20000.0000 [IU] | Freq: Once | INTRAMUSCULAR | Status: AC
  Administered 2019-02-26: 20000 [IU] via SUBCUTANEOUS

## 2019-02-26 NOTE — Patient Instructions (Signed)

## 2019-03-07 DIAGNOSIS — M109 Gout, unspecified: Secondary | ICD-10-CM | POA: Diagnosis not present

## 2019-03-07 DIAGNOSIS — M25562 Pain in left knee: Secondary | ICD-10-CM | POA: Diagnosis not present

## 2019-03-07 DIAGNOSIS — I82A11 Acute embolism and thrombosis of right axillary vein: Secondary | ICD-10-CM | POA: Diagnosis not present

## 2019-03-07 DIAGNOSIS — M0579 Rheumatoid arthritis with rheumatoid factor of multiple sites without organ or systems involvement: Secondary | ICD-10-CM | POA: Diagnosis not present

## 2019-03-07 NOTE — Progress Notes (Signed)
[place]   [phone] [phone]   Clinic Follow up Note   Patient Care Team: Denita Lung, MD as PCP - General (Family Medicine) Burnell Blanks, MD as PCP - Cardiology (Cardiology)  Date of Service:  03/12/2019  CHIEF COMPLAINT: F/u of MDS/MPN, with mildleukocytosis and moderate anemia,JAK2 V617F (-)  CURRENT THERAPY:  Retacrit monthly started 10/30/18. Will increase to every 2 weeks starting 01/29/19  INTERVAL HISTORY:  Paul [name] is here for a follow up. He notes he does not feel well due to no energy. He did not feel difference after increasing Retacrit. He also notes no appetite. He notes his weight is mostly stable.  I reviewed his medication list with him. He denies any chest pain and he denies an pain overall. He notes he did have mild chest tightness before current dose adjustment of cardiac medication. He notes he did have artery blockage but no MI or stent placement. He notes he tries to be active at home with exercises and stretching.  He plans to have second COVID19 vaccine next week.     REVIEW OF SYSTEMS:   Constitutional: Denies fevers, chills or abnormal weight loss (+) No appetite, mostly stable weight.  Eyes: Denies blurriness of vision Ears, nose, mouth, throat, and face: Denies mucositis or sore throat Respiratory: Denies cough, dyspnea or wheezes Cardiovascular: Denies palpitation, chest discomfort or lower extremity swelling Gastrointestinal:  Denies nausea, heartburn or change in bowel habits Skin: Denies abnormal skin rashes Lymphatics: Denies new lymphadenopathy or easy bruising Neurological:Denies numbness, tingling or new weaknesses Behavioral/Psych: Mood is stable, no new changes  All other systems were reviewed with the patient and are negative.  MEDICAL HISTORY:  Past Medical History:  Diagnosis Date  . AAA (abdominal aortic aneurysm) ([place])   . Abnormal LFTs   . AKI (acute kidney injury)  ([place])   . Anemia of chronic disease   . Arthritis    "all over" (12/04/2015)  . Atherosclerosis of native coronary artery of native heart without angina pectoris   . CAD (coronary artery disease)    with cath 09/26/08 showing severe heart disease in RCA with POBA, mild to mod diesease in circ and LAD  . Chronic back pain    back problems prior to surgery   . Constipation   . Coronary atherosclerosis of native coronary artery   . Dehydration   . Diabetes mellitus    with neuropathy  . Diarrhea   . Elevated liver enzymes   . Generalized weakness   . Gout of right hand   . Hiatal hernia   . History of gastroesophageal reflux (GERD)   . History of stomach ulcers   . Hyperlipidemia   . Hypertension   . Metabolic acidosis   . Normochromic anemia 02/23/2016  . 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 Thrombocytopenia ([place])   . Unintentional weight loss 09/27/2017   33 lbs between 4/19 & 8/19  . Vitamin B12 deficiency   . Vitamin B12 deficiency   . Vitamin D deficiency   . Weakness     SURGICAL HISTORY: Past Surgical History:  Procedure Laterality Date  . ANKLE FRACTURE SURGERY Left    "crushed"  . BACK SURGERY  X 3   "fell 4 stories in 1977"  . CARPAL TUNNEL RELEASE Left   . CORONARY ANGIOPLASTY WITH STENT PLACEMENT  09/26/2008   [name] 06/03/2010  . FRACTURE SURGERY    . LEFT HEART CATHETERIZATION WITH CORONARY ANGIOGRAM N/A 01/21/2012  Procedure: LEFT HEART CATHETERIZATION WITH CORONARY ANGIOGRAM;  Surgeon: Burnell Blanks, MD;  Location: [facility] CATH LAB;  Service: Cardiovascular;  Laterality: N/A;  . LUMBAR [place]; ~ 1980  . POSTERIOR LUMBAR FUSION  ~ 1998   "3rd OR to reconstruct my back after fall in 1977"  . 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  ALLERGIES:  is allergic to allopurinol and metformin and related.  MEDICATIONS:  Current  Outpatient Medications  Medication Sig Dispense Refill  . acetaminophen (TYLENOL) 500 MG tablet Take 1,000 mg by mouth every 6 (six) hours as needed.    Marland Kitchen amLODipine (NORVASC) 5 MG tablet Take 1 tablet (5 mg total) by mouth daily. 90 tablet 3  . aspirin 81 MG chewable tablet Chew 81 mg by mouth daily.    . clopidogrel (PLAVIX) 75 MG tablet TAKE 1 TABLET BY MOUTH  DAILY 90 tablet 3  . febuxostat (ULORIC) 40 MG tablet Take 40 mg by mouth daily.    . fish oil-omega-3 fatty acids 1000 MG capsule Take 1 g by mouth 2 (two) times daily.     Marland Kitchen glucose blood (ACCU-CHEK AVIVA PLUS) test strip Test once to twice daily 200 each 4  . hydroxypropyl methylcellulose / hypromellose (ISOPTO TEARS / GONIOVISC) 2.5 % ophthalmic solution Place 1 drop into both eyes as needed for dry eyes.    . isosorbide mononitrate (IMDUR) 30 MG 24 hr tablet Take one tablet by mouth every morning, take two tablets by mouth every evening 270 tablet 3  . metoprolol tartrate (LOPRESSOR) 25 MG tablet Take 1 tablet (25 mg total) by mouth 2 (two) times daily. 180 tablet 3  . Multiple Vitamins-Minerals (CENTRUM) tablet Take 1 tablet by mouth daily.    . nitroGLYCERIN (NITROSTAT) 0.4 MG SL tablet Place 0.4 mg under the tongue every 5 (five) minutes as needed for chest pain. For 3 doses    . pyridoxine (B-6) 100 MG tablet Take 100 mg by mouth daily.    . simvastatin (ZOCOR) 20 MG tablet Take 1 tablet (20 mg total) by mouth at bedtime. 90 tablet 3   No current facility-administered medications for this visit.   Facility-Administered Medications Ordered in Other Visits  Medication Dose Route Frequency Provider Last Rate Last Admin  . epoetin alfa-epbx (RETACRIT) injection 20,000 Units  20,000 Units Subcutaneous Once Truitt Merle, MD        PHYSICAL EXAMINATION: ECOG PERFORMANCE STATUS: 2 - Symptomatic, <50% confined to bed  Vitals:   03/12/19 1337  BP: (!) 147/53  Pulse: (!) 54  Resp: 17  Temp: 98.5 F (36.9 C)  SpO2: 97%   Filed  Weights   03/12/19 1337  Weight: 188 lb (85.3 kg)    Due to COVID19 we will limit examination to appearance. Patient had no complaints.  GENERAL:alert, no distress and comfortable SKIN: skin color normal, no rashes or significant lesions EYES: normal, Conjunctiva are pink and non-injected, sclera clear  NEURO: alert & oriented x 3 with fluent speech   LABORATORY DATA:  I have reviewed the data as listed CBC Latest Ref Rng & Units 03/12/2019 03/08/2019 02/26/2019  WBC 4.0 - 10.5 K/uL 18.2(H) 19.5(H) 18.7(H)  Hemoglobin 13.0 - 17.0 g/dL 9.6(L) 8.8(L) 9.3(L)  Hematocrit 39.0 - 52.0 % 28.9(L) 26.3(L) 27.7(L)  Platelets 150 - 400 K/uL 329 305 311  CMP Latest Ref Rng & Units 01/17/2019 09/02/2018 09/02/2018  Glucose 65 - 99 mg/dL 156(H) - 154(H)  BUN 8 - 27 mg/dL 24 - 21  Creatinine 0.76 - 1.27 mg/dL 1.17 1.24 1.14  Sodium 134 - 144 mmol/L 134 - 136  Potassium 3.5 - 5.2 mmol/L 5.4(H) - 3.8  Chloride 96 - 106 mmol/L 103 - 108  CO2 20 - 29 mmol/L 20 - 21(L)  Calcium 8.6 - 10.2 mg/dL 8.9 - 8.8(L)  Total Protein 6.0 - 8.5 g/dL 7.4 - 7.1  Total Bilirubin 0.0 - 1.2 mg/dL 0.7 - 0.9  Alkaline Phos 39 - 117 IU/L 126(H) - 71  AST 0 - 40 IU/L 29 - 29  ALT 0 - 44 IU/L 18 - 21      RADIOGRAPHIC STUDIES: I have personally reviewed the radiological images as listed and agreed with the findings in the report. No results found.   ASSESSMENT & PLAN:  Paul [name] is a 79 y.o. male with   1. MDS/MPN, JAK2 (-), cytogenetics normal -Hehas had anemia issues since mid 2017 andHghasin 9-10 range most of time, he also has mild leukocytosis, thrombocytosisresolved now -Other contributions to his anemia include stage III chronic renal insufficiency and chronic gouty arthritis.Previous iron study and B12 level were normal. 11/4/19Bone marrow is consistent with a myeloproliferative disorder with dysplastic features. -His leukocytosis is very mild, no thrombocytosis, risk of thrombosis is not  very high, no need treatment such as hydrea.He is currently on aspirin and Plavix, which also helps to prevent thrombosis.  -He was on oral iron, but stopped due to constipation. -His anemia has worsened so I started him on treatment with monthly Retacrit beginning 10/30/18. Aranesp was not approved by his insurance. Retacrit increased to every 2 weeks on 01/29/19.  -Labs reviewed, Hg only mildly improved to 9.6 today. Goal is 9 or 10-11. He remains to have little to no energy and no appetite. I discussed increasing Retacrit to once weekly. I reviewed Retacrit can increase risk of blood clots. He would like to continue current regimen for now and re-evaluate in a few months. This is understandable.  -I encouraged him to work on eating adequately and remain active at home to manage.  -Proceed with Retacrit today and continue every 2 weeks  -F/u in 2 months to see if he needs more frequent injections  -He plans to receive second COVID19 Vaccination next week. I encouraged him to continue precautions.    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 He currently smokes 1/2ppd.  -I reviewed his CT chest from 10/2019with pt in personwhich shows mild emphysema and small rightpleural effusion,pulmonary parenchymal fibrosis, probable UIP. strongly encouraged him to stop smoking to prevent further exacerbation and may need oxygen.  -I recommend he bee seen by Pulmonology for further workup.He will think about it  4. AAA, CAD with stent placed -He notes his AAA has decreased and is being followed by Dr. Doren Custard, vascular surgeon. -He does have blockage in heart  -On Plavixand ASA  5.Mild liver cirrhosis -He has long intermittent history of moderatealcoholdrinking  -CT AP from 10/2017 showed nodularity suspicious of cirrhosis.  -monitor  LFTs  6. H/o ofright LEDVT, provoked -Treated with 3 months of Xarelto, off now  PLAN: -Proceed with Retacrit today -lab and Retacrit every 2 weeks X4  -F/u in 2 months,  to adjust his treatment if needed   No problem-specific Assessment & Plan notes found for this encounter.   No orders of the defined types were placed in this encounter.  All questions were answered. The patient knows to call the clinic with any problems, questions or concerns. No barriers to learning was detected. The total time spent in the appointment was 20 minutes.     Truitt Merle, MD 03/12/2019   I, Joslyn Devon, am acting as scribe for Truitt Merle, MD.   I have reviewed the above documentation for accuracy and completeness, and I agree with the above.

## 2019-03-08 ENCOUNTER — Other Ambulatory Visit: Payer: Self-pay

## 2019-03-08 ENCOUNTER — Encounter: Payer: Self-pay | Admitting: Family Medicine

## 2019-03-08 ENCOUNTER — Ambulatory Visit (INDEPENDENT_AMBULATORY_CARE_PROVIDER_SITE_OTHER): Payer: Medicare Other | Admitting: Family Medicine

## 2019-03-08 ENCOUNTER — Ambulatory Visit
Admission: RE | Admit: 2019-03-08 | Discharge: 2019-03-08 | Disposition: A | Discharge status: Home / Self Care | Payer: Medicare Other | Source: Ambulatory Visit | Attending: Family Medicine | Admitting: Family Medicine

## 2019-03-08 VITALS — BP 130/58 | HR 63 | Wt 192.2 lb

## 2019-03-08 DIAGNOSIS — L989 Disorder of the skin and subcutaneous tissue, unspecified: Secondary | ICD-10-CM | POA: Diagnosis not present

## 2019-03-08 DIAGNOSIS — F17200 Nicotine dependence, unspecified, uncomplicated: Secondary | ICD-10-CM

## 2019-03-08 DIAGNOSIS — M19072 Primary osteoarthritis, left ankle and foot: Secondary | ICD-10-CM | POA: Diagnosis not present

## 2019-03-08 DIAGNOSIS — Z89419 Acquired absence of unspecified great toe: Secondary | ICD-10-CM | POA: Diagnosis not present

## 2019-03-08 DIAGNOSIS — R208 Other disturbances of skin sensation: Secondary | ICD-10-CM

## 2019-03-08 DIAGNOSIS — E1140 Type 2 diabetes mellitus with diabetic neuropathy, unspecified: Secondary | ICD-10-CM | POA: Diagnosis not present

## 2019-03-08 LAB — CBC WITH DIFFERENTIAL/PLATELET
Basophils Absolute: 0.2 10*3/uL (ref 0.0–0.2)
Basos: 1 %
EOS (ABSOLUTE): 0.4 10*3/uL (ref 0.0–0.4)
Eos: 2 %
Hematocrit: 26.3 % — ABNORMAL LOW (ref 37.5–51.0)
Hemoglobin: 8.8 g/dL — ABNORMAL LOW (ref 13.0–17.7)
Lymphocytes Absolute: 2.1 10*3/uL (ref 0.7–3.1)
Lymphs: 11 %
MCH: 28.4 pg (ref 26.6–33.0)
MCHC: 33.5 g/dL (ref 31.5–35.7)
MCV: 85 fL (ref 79–97)
Monocytes Absolute: 2 10*3/uL — ABNORMAL HIGH (ref 0.1–0.9)
Monocytes: 10 %
Neutrophils Absolute: 14.8 10*3/uL — ABNORMAL HIGH (ref 1.4–7.0)
Neutrophils: 69 %
Platelets: 305 10*3/uL (ref 150–450)
RBC: 3.1 x10E6/uL — ABNORMAL LOW (ref 4.14–5.80)
RDW: 20.7 % — ABNORMAL HIGH (ref 11.6–15.4)
WBC: 19.5 10*3/uL — ABNORMAL HIGH (ref 3.4–10.8)

## 2019-03-08 LAB — IMMATURE CELLS: Bands(Auto) Relative: 7 %

## 2019-03-08 NOTE — Progress Notes (Signed)
   Subjective:    Patient ID: Paul [name], male    DOB: [birth year], 79 y.o.   MRN: [id number]  HPI Chief Complaint  Patient presents with  . black spot on foot    black spot on left foot big toe- no pain   He is here with complaints of a dark spot on his left great toe. He noticed this last night or this morning.  No pain but he does not have any sensation in his great toe due to diabetic neuropathy. Has has had 2 toes on his right foot amputated in the past including his right great toe.  Reports feeling fine. No fever, chills, chest pain, shortness of breath, abdominal pain, N/V/D.   States he has a history of diabetes but is not on medication and his blood sugars have been good.  Last Hgb A1c 5.7%  States metformin gave him diarrhea and he lost a lot of weight.   Smoking 1/2 pack per day and is not wanting to quit.   Reviewed allergies, medications, past medical, surgical, family, and social history.    Review of Systems Pertinent positives and negatives in the history of present illness.     Objective:   Physical Exam Constitutional:      General: He is not in acute distress.    Appearance: Normal appearance. He is not ill-appearing.  Musculoskeletal:     Right lower leg: Normal.     Left lower leg: Normal.     Right ankle: Normal.     Left ankle: Normal.     Left foot: Normal capillary refill. No swelling or tenderness. Normal pulse.     Comments: Plantar surface of left great toe1 cm x 1.5 cm circular area of hard callus and underling darkish purplish area. Surrounding tissue is pink and healthy appearing. No edema or drainage.  Toenail on left great toe is thickened, discolored.  Left foot with severely decreased sensation to midfoot.   Skin:    General: Skin is warm and dry.     Capillary Refill: Capillary refill takes less than 2 seconds.  Neurological:     General: No focal deficit present.     Mental Status: He is alert and oriented to person, place, and  time.    BP (!) 130/58   Pulse 63   Wt 192 lb 3.2 oz (87.2 kg)   BMI 27.58 kg/m        Assessment & Plan:  Sore on toe - Plan: CBC with Differential/Platelet, DG Foot Complete Left, Ambulatory referral to Podiatry  Type 2 diabetes mellitus with diabetic neuropathy, without long-term current use of insulin ([place]) - Plan: CBC with Differential/Platelet  Current smoker  History of amputation of great toe ([name])  Decreased sensation of foot - Plan: DG Foot Complete Left, Ambulatory referral to Podiatry  Here with complaints of a newly found dark spot on left great toe.  He has a history of amputation to the great toe on his right foot along with another toe on that foot.  History of diabetic neuropathy and diabetes well controlled. Encouraged him to stop smoking and he says he has no plan to stop. I will get a stat CBC, foot x-ray and urgent referral to [facility] for further evaluation.  Dr. Clare Charon also examined patient and is okay with this plan. I will have him follow up next week.

## 2019-03-12 ENCOUNTER — Inpatient Hospital Stay (HOSPITAL_BASED_OUTPATIENT_CLINIC_OR_DEPARTMENT_OTHER): Payer: Medicare Other | Admitting: Hematology

## 2019-03-12 ENCOUNTER — Inpatient Hospital Stay: Payer: Medicare Other

## 2019-03-12 ENCOUNTER — Inpatient Hospital Stay: Payer: Medicare Other | Attending: Hematology

## 2019-03-12 ENCOUNTER — Other Ambulatory Visit: Payer: Self-pay

## 2019-03-12 ENCOUNTER — Encounter: Payer: Self-pay | Admitting: Hematology

## 2019-03-12 VITALS — BP 147/53 | HR 54 | Temp 98.5°F | Resp 17 | Ht 70.0 in | Wt 188.0 lb

## 2019-03-12 DIAGNOSIS — J439 Emphysema, unspecified: Secondary | ICD-10-CM | POA: Diagnosis not present

## 2019-03-12 DIAGNOSIS — I129 Hypertensive chronic kidney disease with stage 1 through stage 4 chronic kidney disease, or unspecified chronic kidney disease: Secondary | ICD-10-CM | POA: Insufficient documentation

## 2019-03-12 DIAGNOSIS — I714 Abdominal aortic aneurysm, without rupture: Secondary | ICD-10-CM | POA: Insufficient documentation

## 2019-03-12 DIAGNOSIS — Z86718 Personal history of other venous thrombosis and embolism: Secondary | ICD-10-CM | POA: Diagnosis not present

## 2019-03-12 DIAGNOSIS — J90 Pleural effusion, not elsewhere classified: Secondary | ICD-10-CM | POA: Insufficient documentation

## 2019-03-12 DIAGNOSIS — D469 Myelodysplastic syndrome, unspecified: Secondary | ICD-10-CM | POA: Diagnosis not present

## 2019-03-12 DIAGNOSIS — N183 Chronic kidney disease, stage 3 unspecified: Secondary | ICD-10-CM | POA: Insufficient documentation

## 2019-03-12 DIAGNOSIS — K7460 Unspecified cirrhosis of liver: Secondary | ICD-10-CM | POA: Diagnosis not present

## 2019-03-12 DIAGNOSIS — F17210 Nicotine dependence, cigarettes, uncomplicated: Secondary | ICD-10-CM | POA: Diagnosis not present

## 2019-03-12 DIAGNOSIS — M109 Gout, unspecified: Secondary | ICD-10-CM | POA: Insufficient documentation

## 2019-03-12 DIAGNOSIS — D631 Anemia in chronic kidney disease: Secondary | ICD-10-CM | POA: Diagnosis not present

## 2019-03-12 DIAGNOSIS — Z79899 Other long term (current) drug therapy: Secondary | ICD-10-CM | POA: Insufficient documentation

## 2019-03-12 DIAGNOSIS — D638 Anemia in other chronic diseases classified elsewhere: Secondary | ICD-10-CM

## 2019-03-12 DIAGNOSIS — I2510 Atherosclerotic heart disease of native coronary artery without angina pectoris: Secondary | ICD-10-CM | POA: Diagnosis not present

## 2019-03-12 LAB — CBC WITH DIFFERENTIAL (CANCER CENTER ONLY)
Abs Immature Granulocytes: 1.45 10*3/uL — ABNORMAL HIGH (ref 0.00–0.07)
Basophils Absolute: 0.4 10*3/uL — ABNORMAL HIGH (ref 0.0–0.1)
Basophils Relative: 2 %
Eosinophils Absolute: 0.5 10*3/uL (ref 0.0–0.5)
Eosinophils Relative: 3 %
HCT: 28.9 % — ABNORMAL LOW (ref 39.0–52.0)
Hemoglobin: 9.6 g/dL — ABNORMAL LOW (ref 13.0–17.0)
Immature Granulocytes: 8 %
Lymphocytes Relative: 16 %
Lymphs Abs: 2.9 10*3/uL (ref 0.7–4.0)
MCH: 27.9 pg (ref 26.0–34.0)
MCHC: 33.2 g/dL (ref 30.0–36.0)
MCV: 84 fL (ref 80.0–100.0)
Monocytes Absolute: 0.6 10*3/uL (ref 0.1–1.0)
Monocytes Relative: 3 %
Neutro Abs: 12.5 10*3/uL — ABNORMAL HIGH (ref 1.7–7.7)
Neutrophils Relative %: 68 %
Platelet Count: 329 10*3/uL (ref 150–400)
RBC: 3.44 MIL/uL — ABNORMAL LOW (ref 4.22–5.81)
RDW: 19.6 % — ABNORMAL HIGH (ref 11.5–15.5)
WBC Count: 18.2 10*3/uL — ABNORMAL HIGH (ref 4.0–10.5)
nRBC: 0 % (ref 0.0–0.2)

## 2019-03-12 LAB — RETIC PANEL
Immature Retic Fract: 24 % — ABNORMAL HIGH (ref 2.3–15.9)
RBC.: 3.46 MIL/uL — ABNORMAL LOW (ref 4.22–5.81)
Retic Count, Absolute: 115.2 10*3/uL (ref 19.0–186.0)
Retic Ct Pct: 3.3 % — ABNORMAL HIGH (ref 0.4–3.1)
Reticulocyte Hemoglobin: 32 pg (ref >27.9)

## 2019-03-12 MED ORDER — EPOETIN ALFA-EPBX 10000 UNIT/ML IJ SOLN
INTRAMUSCULAR | Status: AC
  Filled 2019-03-12: qty 2

## 2019-03-12 MED ORDER — EPOETIN ALFA-EPBX 10000 UNIT/ML IJ SOLN
20000.0000 [IU] | Freq: Once | INTRAMUSCULAR | Status: AC
  Administered 2019-03-12: 15:00:00 20000 [IU] via SUBCUTANEOUS

## 2019-03-12 NOTE — Patient Instructions (Signed)
Paul [name]   [phone] [phone]   Clinic Follow up Note   Patient Care Team: Denita Lung, MD as PCP - General (Family Medicine) Burnell Blanks, MD as PCP - Cardiology (Cardiology)  Date of Service:  03/12/2019  CHIEF COMPLAINT: MDS/MPN, with mild leukocytosis and moderate anemia, JAK2 V617F (-)  Clinical summary per [name] 79 year old man I initially saw when he was hospitalized 2 years ago in 2017 with an allergic reaction to allopurinol resulting in drug induced hepatitis and thrombocytopenia. He was on the drug for chronic gout. Gout currently controlled with colchicine and Uloric.I just reevaluated him on September 27, 2017 for acute on chronic anemia, leukocytosis, thrombocytosis, and an unintentional 40 pound weight loss.  Repeat serum protein and immunofixation electrophoresis shows persistent elevation of IgG and IgA but polyclonal not amonoclonalpattern consistent with chronic liver disease.  December 05, 2017 bone marrow aspiration, biopsy, and cytogenetics done as ongoing evaluation of acute on chronic macrocytic anemia with borderline elevation of white count and platelets. No monocytosis. Please see previous notes for complete details of recent evaluation. Bone marrow is hypercellular. Scattered paratrabecular lymphoid aggregates. No monoclonal populations on immunohistochemical staining or flow cytometry. 3% blasts. Rare ring sideroblasts. Dyserythropoiesis. Dysmegakaryopoiesis.No deletion in chromosomes 5 or 7. Loss of Y chromosome which is normal for age. Fairly exhaustive evaluation to exclude occult malignancy has been unrevealing.  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 NEGATIVE for the JAK2 V617F mutation.   CURRENT THERAPY:  Retacrit monthly started 10/30/18. Will increase to every 2 weeks starting 01/29/19  INTERVAL HISTORY:  Paul [name] is  here for a follow up. He was last seen by me in 04/2018. He was seen by NP Laice in interim. He presents to the clinic alone. He notes he is still weak and tired. He notes with start of Retacrit he felt good for a few weeks but then will feel down again. He notes he is willing to come more often for more treatment. He notes he does not do much at home. He mostly sits but can shower as needed. He notes his wife gets the mail and does not walk around due to low energy. He denies depression.    REVIEW OF SYSTEMS:    Constitutional: Denies fevers, chills or abnormal weight loss (+) fatigue and weakness Eyes: Denies blurriness of vision Ears, nose, mouth, throat, and face: Denies mucositis or sore throat Respiratory: Denies cough, dyspnea or wheezes Cardiovascular: Denies palpitation, chest discomfort or lower extremity swelling Gastrointestinal:  Denies nausea, heartburn or change in bowel habits Skin: Denies abnormal skin rashes Lymphatics: Denies new lymphadenopathy or easy bruising Neurological:Denies numbness, tingling or new weaknesses Behavioral/Psych: Mood is stable, no new changes  All other systems were reviewed with the patient and are negative.  MEDICAL HISTORY:  Past Medical History:  Diagnosis Date  . AAA (abdominal aortic aneurysm) ([place])   . Abnormal LFTs   . AKI (acute kidney injury) ([place])   . Anemia of chronic disease   . Arthritis    "all over" (12/04/2015)  . Atherosclerosis of native coronary artery of native heart without angina pectoris   . CAD (coronary artery disease)    with cath 09/26/08 showing severe heart disease in RCA with POBA, mild to mod diesease in circ and LAD  . Chronic back pain    back problems prior to surgery   . Constipation   . Coronary atherosclerosis of native coronary artery   .  Dehydration   . Diabetes mellitus    with neuropathy  . Diarrhea   . Elevated liver enzymes   . Generalized weakness   . Gout of right hand   . Hiatal hernia   .  History of gastroesophageal reflux (GERD)   . History of stomach ulcers   . Hyperlipidemia   . Hypertension   . Metabolic acidosis   . Normochromic anemia 02/23/2016  . 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 Thrombocytopenia ([place])   . Unintentional weight loss 09/27/2017   33 lbs between 4/19 & 8/19  . Vitamin B12 deficiency   . Vitamin B12 deficiency   . Vitamin D deficiency   . Weakness     SURGICAL HISTORY: Past Surgical History:  Procedure Laterality Date  . ANKLE FRACTURE SURGERY Left    "crushed"  . BACK SURGERY  X 3   "fell 4 stories in 1977"  . CARPAL TUNNEL RELEASE Left   . CORONARY ANGIOPLASTY WITH STENT PLACEMENT  09/26/2008   [name] 06/03/2010  . FRACTURE SURGERY    . LEFT HEART CATHETERIZATION WITH CORONARY ANGIOGRAM N/A 01/21/2012   Procedure: LEFT HEART CATHETERIZATION WITH CORONARY ANGIOGRAM;  Surgeon: Burnell Blanks, MD;  Location: [facility] CATH LAB;  Service: Cardiovascular;  Laterality: N/A;  . LUMBAR [place]; ~ 1980  . POSTERIOR LUMBAR FUSION  ~ 1998   "3rd OR to reconstruct my back after fall in 1977"  . 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  ALLERGIES:  is allergic to allopurinol and metformin and related.  MEDICATIONS:  Current Outpatient Medications  Medication Sig Dispense Refill  . acetaminophen (TYLENOL) 500 MG tablet Take 1,000 mg by mouth every 6 (six) hours as needed.    Marland Kitchen amLODipine (NORVASC) 5 MG tablet Take 1 tablet (5 mg total) by mouth daily. 90 tablet 3  . aspirin 81 MG chewable tablet Chew 81 mg by mouth daily.    . clopidogrel (PLAVIX) 75 MG tablet TAKE 1 TABLET BY MOUTH  DAILY 90 tablet 3  . febuxostat (ULORIC) 40 MG tablet Take 40 mg by mouth daily.    . fish oil-omega-3 fatty acids 1000 MG capsule Take 1 g by mouth 2 (two) times daily.     Marland Kitchen glucose blood (ACCU-CHEK AVIVA PLUS) test strip Test once to twice  daily 200 each 4  . hydroxypropyl methylcellulose / hypromellose (ISOPTO TEARS / GONIOVISC) 2.5 % ophthalmic solution Place 1 drop into both eyes as needed for dry eyes.    . isosorbide mononitrate (IMDUR) 30 MG 24 hr tablet Take one tablet by mouth every morning, take two tablets by mouth every evening 270 tablet 3  . metoprolol tartrate (LOPRESSOR) 25 MG tablet Take 1 tablet (25 mg total) by mouth 2 (two) times daily. 180 tablet 3  . Multiple Vitamins-Minerals (CENTRUM) tablet Take 1 tablet by mouth daily.    . nitroGLYCERIN (NITROSTAT) 0.4 MG SL tablet Place 0.4 mg under the tongue every 5 (five) minutes as needed for chest pain. For 3 doses    . pyridoxine (B-6) 100 MG tablet Take 100 mg by mouth daily.    . simvastatin (ZOCOR) 20 MG tablet Take 1 tablet (20 mg total) by mouth at bedtime. 90 tablet 3   Current Facility-Administered Medications  Medication Dose Route Frequency Provider Last Rate Last Admin  . epoetin alfa-epbx (RETACRIT) injection  20,000 Units  20,000 Units Subcutaneous Once Truitt Merle, MD        PHYSICAL EXAMINATION: ECOG PERFORMANCE STATUS: 2 - Symptomatic, <50% confined to bed  There were no vitals filed for this visit. There were no vitals filed for this visit.  Due to [place] we will limit examination to appearance. Patient had no complaints.  GENERAL:alert, no distress and comfortable SKIN: skin color normal, no rashes or significant lesions EYES: normal, Conjunctiva are pink and non-injected, sclera clear  NEURO: alert & oriented x 3 with fluent speech   LABORATORY DATA:  I have reviewed the data as listed CBC Latest Ref Rng & Units 03/12/2019 03/08/2019 02/26/2019  WBC 4.0 - 10.5 K/uL 18.2(H) 19.5(H) 18.7(H)  Hemoglobin 13.0 - 17.0 g/dL 9.6(L) 8.8(L) 9.3(L)  Hematocrit 39.0 - 52.0 % 28.9(L) 26.3(L) 27.7(L)  Platelets 150 - 400 K/uL 329 305 311     CMP Latest Ref Rng & Units 01/17/2019 09/02/2018 09/02/2018  Glucose 65 - 99 mg/dL 156(H) - 154(H)  BUN 8 - 27  mg/dL 24 - 21  Creatinine 0.76 - 1.27 mg/dL 1.17 1.24 1.14  Sodium 134 - 144 mmol/L 134 - 136  Potassium 3.5 - 5.2 mmol/L 5.4(H) - 3.8  Chloride 96 - 106 mmol/L 103 - 108  CO2 20 - 29 mmol/L 20 - 21(L)  Calcium 8.6 - 10.2 mg/dL 8.9 - 8.8(L)  Total Protein 6.0 - 8.5 g/dL 7.4 - 7.1  Total Bilirubin 0.0 - 1.2 mg/dL 0.7 - 0.9  Alkaline Phos 39 - 117 IU/L 126(H) - 71  AST 0 - 40 IU/L 29 - 29  ALT 0 - 44 IU/L 18 - 21      RADIOGRAPHIC STUDIES: I have personally reviewed the radiological images as listed and agreed with the findings in the report. No results found.   ASSESSMENT & PLAN:  Paul [name] is a 79 y.o. male with    1. MDS/MPN, JAK2 (-), cytogenetics normal  -He has had anemia issues since mid 2017 and Hg has in 9-10 range most of time, he also has mild leukocytosis, thrombocytosis resolved now -Other contributions to his anemia include stage III chronic renal insufficiency and chronic gouty arthritis. Previous iron study and B12 level were normal. 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  -His leukocytosis is very mild, no thrombocytosis, risk of thrombosis is not very high, no need treatment such as hydrea. He is currently on aspirin and Plavix, which also helps to prevent thrombosis.  -He was on oral iron, but stopped due to constipation. -His anemia has worsened so I started him on treatment with monthly Retacrit beginning 10/30/18. Aranesp was not approved by his insurance.  -S/p 3 doses he notes this has helped but doe snot last long. After a 2-3 weeks he is back to feeling very fatigued and weak like he does today. I will increase Retacrit to every 2 weeks starting today. If not adequate, will increase dosage in future. Labs reviewed, Hg 9.2 today, will proceed with injection.  -F/u in 6 weeks    2. 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 He currently smokes 1/2ppd.  -I reviewed his CT chest from 11/2017 with pt in person which shows mild emphysema and small right pleural effusion, pulmonary parenchymal fibrosis, probable UIP.  strongly encouraged him  to stop smoking to prevent further exacerbation and may need oxygen.  -I recommend he bee seen by Pulmonology for further workup. He will think about it    4. AAA, CAD with stent placed -He notes his AAA has decreased and is being followed by Dr. Doren Custard, vascular surgeon. -He does have blockage in heart  -On Plavix and ASA  5. Mild liver cirrhosis  -He has long intermittent history of moderate alcohol drinking  -CT AP from 10/2017 showed nodularity suspicious of cirrhosis.  -No hepatomegaly on exam  -monitor LFTs   6. H/o of right LE DVT, provoked  -Treated with 3 months of Xarelto, off now   PLAN:  -Proceed with Retacrit today, will increase to every 2 weeks   -Lab and Retacrit every 2 weeks x3 -F/u in 6 weeks to see if he needs higher dosage    No problem-specific Assessment & Plan notes found for this encounter.   No orders of the defined types were placed in this encounter.  All questions were answered. The patient knows to call the clinic with any problems, questions or concerns. No barriers to learning was detected. I spent 15 minutes counseling the patient face to face. The total time spent in the appointment was 20 minutes and more than 50% was on counseling and review of test results     Tildon Husky, LPN 09/01/1910   I, Joslyn Devon, am acting as scribe for Truitt Merle, MD.   I have reviewed the above documentation for accuracy and completeness, and I agree with the above.      Epoetin Alfa injection What is this medicine? EPOETIN ALFA (e POE e tin AL fa) helps your body make more red blood cells. This medicine is used to treat anemia caused by chronic kidney  disease, cancer chemotherapy, or HIV-therapy. It may also be used before surgery if you have anemia. This medicine may be used for other purposes; ask your health care provider or pharmacist if you have questions. COMMON BRAND NAME(S): Epogen, Procrit, Retacrit What should I tell my health care provider before I take this medicine? They need to know if you have any of these conditions:  cancer  heart disease  high blood pressure  history of blood clots  history of stroke  low levels of folate, iron, or vitamin B12 in the blood  seizures  an unusual or allergic reaction to erythropoietin, albumin, benzyl alcohol, hamster proteins, other medicines, foods, dyes, or preservatives  pregnant or trying to get pregnant  breast-feeding How should I use this medicine? This medicine is for injection into a vein or under the skin. It is usually given by a health care professional in a hospital or clinic setting. If you get this medicine at home, you will be taught how to prepare and give this medicine. Use exactly as directed. Take your medicine at regular intervals. Do not take your medicine more often than directed. It is important that you put your used needles and syringes in a special sharps container. Do not put them in a trash can. If you do not have a sharps container, call your pharmacist or healthcare provider to get one. A special MedGuide will be given to you by the pharmacist with each prescription and refill. Be sure to read this information carefully each time. Talk to your pediatrician regarding the use of this medicine in children. While this drug may be prescribed for selected conditions, precautions do apply. Overdosage: If you think you have  taken too much of this medicine contact a poison control center or emergency room at once. NOTE: This medicine is only for you. Do not share this medicine with others. What if I miss a dose? If you miss a dose, take it as soon as you  can. If it is almost time for your next dose, take only that dose. Do not take double or extra doses. What may interact with this medicine? Interactions have not been studied. This list may not describe all possible interactions. Give your health care provider a list of all the medicines, herbs, non-prescription drugs, or dietary supplements you use. Also tell them if you smoke, drink alcohol, or use illegal drugs. Some items may interact with your medicine. What should I watch for while using this medicine? Your condition will be monitored carefully while you are receiving this medicine. You may need blood work done while you are taking this medicine. This medicine may cause a decrease in vitamin B6. You should make sure that you get enough vitamin B6 while you are taking this medicine. Discuss the foods you eat and the vitamins you take with your health care professional. What side effects may I notice from receiving this medicine? Side effects that you should report to your doctor or health care professional as soon as possible:  allergic reactions like skin rash, itching or hives, swelling of the face, lips, or tongue  seizures  signs and symptoms of a blood clot such as breathing problems; changes in vision; chest pain; severe, sudden headache; pain, swelling, warmth in the leg; trouble speaking; sudden numbness or weakness of the face, arm or leg  signs and symptoms of a stroke like changes in vision; confusion; trouble speaking or understanding; severe headaches; sudden numbness or weakness of the face, arm or leg; trouble walking; dizziness; loss of balance or coordination Side effects that usually do not require medical attention (report to your doctor or health care professional if they continue or are bothersome):  chills  cough  dizziness  fever  headaches  joint pain  muscle cramps  muscle pain  nausea, vomiting  pain, redness, or irritation at site where  injected This list may not describe all possible side effects. Call your doctor for medical advice about side effects. You may report side effects to FDA at 1-800-FDA-1088. Where should I keep my medicine? Keep out of the reach of children. Store in a refrigerator between 2 and 8 degrees C (36 and 46 degrees F). Do not freeze or shake. Throw away any unused portion if using a single-dose vial. Multi-dose vials can be kept in the refrigerator for up to 21 days after the initial dose. Throw away unused medicine. NOTE: This sheet is a summary. It may not cover all possible information. If you have questions about this medicine, talk to your doctor, pharmacist, or health care provider.  2020 Elsevier/Gold Standard (2016-08-27 08:35:19)

## 2019-03-13 ENCOUNTER — Encounter: Payer: Self-pay | Admitting: Family Medicine

## 2019-03-13 ENCOUNTER — Ambulatory Visit (INDEPENDENT_AMBULATORY_CARE_PROVIDER_SITE_OTHER): Payer: Medicare Other | Admitting: Family Medicine

## 2019-03-13 ENCOUNTER — Telehealth: Payer: Self-pay | Admitting: Hematology

## 2019-03-13 VITALS — BP 148/72 | HR 70 | Temp 98.6°F | Wt 189.4 lb

## 2019-03-13 DIAGNOSIS — L84 Corns and callosities: Secondary | ICD-10-CM

## 2019-03-13 DIAGNOSIS — B351 Tinea unguium: Secondary | ICD-10-CM | POA: Insufficient documentation

## 2019-03-13 NOTE — Progress Notes (Signed)
   Subjective:    Patient ID: Paul [name], male    DOB: 1940/06/29, 79 y.o.   MRN: [id number]  HPI He is here for follow-up visit from his last appointment.  He was given a referral to podiatry but says he never heard anything from anybody.  He did have a large callus as well as thickening of his nails and needed to see podiatry.   Review of Systems     Objective:   Physical Exam Alert and in no distress.  The callus is not changed in the nail his quite thick with the right great toe.       Assessment & Plan:  Callus of foot  Onychomycosis I reviewed the record and they indeed did call him but he said he did not get  the phone call.  I wrote the phone number down and he is to call them to get this definitively taken care of.  He was comfortable with that

## 2019-03-13 NOTE — Telephone Encounter (Signed)
Scheduled appt per 2/8 los.  Spoke with pt and he is aware of the appt date and times

## 2019-03-16 ENCOUNTER — Ambulatory Visit: Payer: Medicare Other | Admitting: Podiatry

## 2019-03-16 ENCOUNTER — Other Ambulatory Visit: Payer: Self-pay

## 2019-03-16 DIAGNOSIS — E1142 Type 2 diabetes mellitus with diabetic polyneuropathy: Secondary | ICD-10-CM | POA: Diagnosis not present

## 2019-03-16 DIAGNOSIS — L97521 Non-pressure chronic ulcer of other part of left foot limited to breakdown of skin: Secondary | ICD-10-CM

## 2019-03-26 ENCOUNTER — Inpatient Hospital Stay: Payer: Medicare Other

## 2019-03-26 ENCOUNTER — Other Ambulatory Visit: Payer: Self-pay

## 2019-03-26 VITALS — BP 142/72 | HR 72 | Temp 98.2°F | Resp 18

## 2019-03-26 DIAGNOSIS — D469 Myelodysplastic syndrome, unspecified: Secondary | ICD-10-CM | POA: Diagnosis not present

## 2019-03-26 DIAGNOSIS — M109 Gout, unspecified: Secondary | ICD-10-CM | POA: Diagnosis not present

## 2019-03-26 DIAGNOSIS — J439 Emphysema, unspecified: Secondary | ICD-10-CM | POA: Diagnosis not present

## 2019-03-26 DIAGNOSIS — Z79899 Other long term (current) drug therapy: Secondary | ICD-10-CM | POA: Diagnosis not present

## 2019-03-26 DIAGNOSIS — K7460 Unspecified cirrhosis of liver: Secondary | ICD-10-CM | POA: Diagnosis not present

## 2019-03-26 DIAGNOSIS — D638 Anemia in other chronic diseases classified elsewhere: Secondary | ICD-10-CM

## 2019-03-26 DIAGNOSIS — J90 Pleural effusion, not elsewhere classified: Secondary | ICD-10-CM | POA: Diagnosis not present

## 2019-03-26 DIAGNOSIS — I714 Abdominal aortic aneurysm, without rupture: Secondary | ICD-10-CM | POA: Diagnosis not present

## 2019-03-26 DIAGNOSIS — D631 Anemia in chronic kidney disease: Secondary | ICD-10-CM | POA: Diagnosis not present

## 2019-03-26 DIAGNOSIS — I129 Hypertensive chronic kidney disease with stage 1 through stage 4 chronic kidney disease, or unspecified chronic kidney disease: Secondary | ICD-10-CM | POA: Diagnosis not present

## 2019-03-26 DIAGNOSIS — Z86718 Personal history of other venous thrombosis and embolism: Secondary | ICD-10-CM | POA: Diagnosis not present

## 2019-03-26 DIAGNOSIS — I2510 Atherosclerotic heart disease of native coronary artery without angina pectoris: Secondary | ICD-10-CM | POA: Diagnosis not present

## 2019-03-26 DIAGNOSIS — N183 Chronic kidney disease, stage 3 unspecified: Secondary | ICD-10-CM | POA: Diagnosis not present

## 2019-03-26 LAB — CBC WITH DIFFERENTIAL (CANCER CENTER ONLY)
Abs Immature Granulocytes: 0.29 10*3/uL — ABNORMAL HIGH (ref 0.00–0.07)
Basophils Absolute: 0.2 10*3/uL — ABNORMAL HIGH (ref 0.0–0.1)
Basophils Relative: 1 %
Eosinophils Absolute: 0.4 10*3/uL (ref 0.0–0.5)
Eosinophils Relative: 3 %
HCT: 26.7 % — ABNORMAL LOW (ref 39.0–52.0)
Hemoglobin: 8.9 g/dL — ABNORMAL LOW (ref 13.0–17.0)
Immature Granulocytes: 2 %
Lymphocytes Relative: 18 %
Lymphs Abs: 3 10*3/uL (ref 0.7–4.0)
MCH: 28.6 pg (ref 26.0–34.0)
MCHC: 33.3 g/dL (ref 30.0–36.0)
MCV: 85.9 fL (ref 80.0–100.0)
Monocytes Absolute: 0.4 10*3/uL (ref 0.1–1.0)
Monocytes Relative: 2 %
Neutro Abs: 12.5 10*3/uL — ABNORMAL HIGH (ref 1.7–7.7)
Neutrophils Relative %: 74 %
Platelet Count: 265 10*3/uL (ref 150–400)
RBC: 3.11 MIL/uL — ABNORMAL LOW (ref 4.22–5.81)
RDW: 19.6 % — ABNORMAL HIGH (ref 11.5–15.5)
WBC Count: 16.9 10*3/uL — ABNORMAL HIGH (ref 4.0–10.5)
nRBC: 0 % (ref 0.0–0.2)

## 2019-03-26 LAB — RETIC PANEL
Immature Retic Fract: 10.3 % (ref 2.3–15.9)
RBC.: 3.12 MIL/uL — ABNORMAL LOW (ref 4.22–5.81)
Retic Count, Absolute: 76.1 10*3/uL (ref 19.0–186.0)
Retic Ct Pct: 2.4 % (ref 0.4–3.1)
Reticulocyte Hemoglobin: 32.4 pg (ref >27.9)

## 2019-03-26 MED ORDER — EPOETIN ALFA-EPBX 10000 UNIT/ML IJ SOLN
20000.0000 [IU] | Freq: Once | INTRAMUSCULAR | Status: AC
  Administered 2019-03-26: 15:00:00 20000 [IU] via SUBCUTANEOUS

## 2019-03-26 MED ORDER — EPOETIN ALFA-EPBX 10000 UNIT/ML IJ SOLN
INTRAMUSCULAR | Status: AC
  Filled 2019-03-26: qty 2

## 2019-03-26 NOTE — Patient Instructions (Signed)

## 2019-03-30 ENCOUNTER — Ambulatory Visit: Payer: Medicare Other | Admitting: Podiatry

## 2019-03-30 ENCOUNTER — Other Ambulatory Visit: Payer: Self-pay

## 2019-03-30 VITALS — Temp 96.6°F

## 2019-03-30 DIAGNOSIS — E11621 Type 2 diabetes mellitus with foot ulcer: Secondary | ICD-10-CM

## 2019-03-30 DIAGNOSIS — L97529 Non-pressure chronic ulcer of other part of left foot with unspecified severity: Secondary | ICD-10-CM

## 2019-03-30 MED ORDER — SILVER SULFADIAZINE 1 % EX CREA: TOPICAL_CREAM | CUTANEOUS | 0 refills | Status: DC

## 2019-03-30 NOTE — Progress Notes (Signed)
  Subjective:  Patient ID: Paul [name], male    DOB: Jun 10, 1940,  MRN: [id number]  Chief Complaint  Patient presents with  . Wound Check    L hallux (plantar). Pt stated, "It doesn't seem to be healing well. No pain. No pus, fever/chills, or N&V".   79 y.o. male presents for wound care. Hx confirmed with patient.  Objective:  Physical Exam: Wound Location: left hallux Wound Measurement: 0.3x0.5 Wound Base: Granular/Healthy Peri-wound: Calloused Exudate: Scant/small amount Serosanguinous exudate wound without warmth, erythema, signs of acute infection    Assessment:   1. Diabetic ulcer of left great toe [facility])      Plan:  Patient was evaluated and treated and all questions answered.  Ulcer Left hallux -Offload ulcer with surgical shoe -Wound cleansed and debrided Procedure: Selective Debridement of Wound Rationale: Removal of devitalized tissue from the wound to promote healing.  Pre-Debridement Wound Measurements: 0.3 cm x 0.5 cm x 0.2 cm  Post-Debridement Wound Measurements: same as pre-debridement. Type of Debridement: sharp selective Tissue Removed: Devitalized soft-tissue Dressing: Dry, sterile, compression dressing. Disposition: Patient tolerated procedure well. Patient to return in 1 week for follow-up.   No follow-ups on file.  MDM

## 2019-03-30 NOTE — Progress Notes (Signed)
  Subjective:  Patient ID: Paul [name], male    DOB: [birth year],  MRN: [id number]  Chief Complaint  Patient presents with  . Foot Problem    Pt states "dark spot" 1 week duration.    79 y.o. male presents for wound care. Hx confirmed with patient. Present on great toe unsure how it happened Objective:  Physical Exam: Wound Location: left hallux Wound Measurement: 0.5x0.5 Wound Base: Granular/Healthy Peri-wound: bulla Exudate: None: wound tissue dry wound without warmth, erythema, signs of acute infection Absent protective sensation  Assessment:   1. Ulcer of great toe, left, limited to breakdown of skin ([place])   2. DM type 2 with diabetic peripheral neuropathy ([place])      Plan:  Patient was evaluated and treated and all questions answered.  Ulcer left hallux -Wound cleansed and debrided -Apply medihoney daily  Procedure: Selective Debridement of Wound Rationale: Removal of devitalized tissue from the wound to promote healing.  Pre-Debridement Wound Measurements: 0.5 cm x 0.5 cm x 0.1 cm  Post-Debridement Wound Measurements: same as pre-debridement. Type of Debridement: sharp selective Tissue Removed: Devitalized soft-tissue Dressing: Dry, sterile, compression dressing. Disposition: Patient tolerated procedure well. Patient to return in 1 week for follow-up.   Return in about 2 weeks (around 03/30/2019) for Wound Care.

## 2019-04-09 ENCOUNTER — Inpatient Hospital Stay: Payer: Medicare Other | Attending: Hematology

## 2019-04-09 ENCOUNTER — Other Ambulatory Visit: Payer: Self-pay

## 2019-04-09 ENCOUNTER — Inpatient Hospital Stay: Payer: Medicare Other

## 2019-04-09 VITALS — BP 142/52 | HR 63 | Temp 98.9°F | Resp 16

## 2019-04-09 DIAGNOSIS — D631 Anemia in chronic kidney disease: Secondary | ICD-10-CM | POA: Diagnosis not present

## 2019-04-09 DIAGNOSIS — Z79899 Other long term (current) drug therapy: Secondary | ICD-10-CM | POA: Diagnosis not present

## 2019-04-09 DIAGNOSIS — C946 Myelodysplastic disease, not classified: Secondary | ICD-10-CM

## 2019-04-09 DIAGNOSIS — N183 Chronic kidney disease, stage 3 unspecified: Secondary | ICD-10-CM | POA: Insufficient documentation

## 2019-04-09 DIAGNOSIS — I129 Hypertensive chronic kidney disease with stage 1 through stage 4 chronic kidney disease, or unspecified chronic kidney disease: Secondary | ICD-10-CM | POA: Insufficient documentation

## 2019-04-09 DIAGNOSIS — D469 Myelodysplastic syndrome, unspecified: Secondary | ICD-10-CM

## 2019-04-09 DIAGNOSIS — D638 Anemia in other chronic diseases classified elsewhere: Secondary | ICD-10-CM

## 2019-04-09 LAB — CBC WITH DIFFERENTIAL (CANCER CENTER ONLY)
Abs Immature Granulocytes: 0.37 10*3/uL — ABNORMAL HIGH (ref 0.00–0.07)
Basophils Absolute: 0.3 10*3/uL — ABNORMAL HIGH (ref 0.0–0.1)
Basophils Relative: 2 %
Eosinophils Absolute: 0.6 10*3/uL — ABNORMAL HIGH (ref 0.0–0.5)
Eosinophils Relative: 3 %
HCT: 30 % — ABNORMAL LOW (ref 39.0–52.0)
Hemoglobin: 10 g/dL — ABNORMAL LOW (ref 13.0–17.0)
Immature Granulocytes: 2 %
Lymphocytes Relative: 20 %
Lymphs Abs: 3.8 10*3/uL (ref 0.7–4.0)
MCH: 28.2 pg (ref 26.0–34.0)
MCHC: 33.3 g/dL (ref 30.0–36.0)
MCV: 84.5 fL (ref 80.0–100.0)
Monocytes Absolute: 0.5 10*3/uL (ref 0.1–1.0)
Monocytes Relative: 3 %
Neutro Abs: 13 10*3/uL — ABNORMAL HIGH (ref 1.7–7.7)
Neutrophils Relative %: 70 %
Platelet Count: 329 10*3/uL (ref 150–400)
RBC: 3.55 MIL/uL — ABNORMAL LOW (ref 4.22–5.81)
RDW: 19.1 % — ABNORMAL HIGH (ref 11.5–15.5)
WBC Count: 18.6 10*3/uL — ABNORMAL HIGH (ref 4.0–10.5)
nRBC: 0 % (ref 0.0–0.2)

## 2019-04-09 LAB — RETIC PANEL
Immature Retic Fract: 12.1 % (ref 2.3–15.9)
RBC.: 3.51 MIL/uL — ABNORMAL LOW (ref 4.22–5.81)
Retic Count, Absolute: 98.6 10*3/uL (ref 19.0–186.0)
Retic Ct Pct: 2.8 % (ref 0.4–3.1)
Reticulocyte Hemoglobin: 32 pg (ref >27.9)

## 2019-04-09 MED ORDER — EPOETIN ALFA-EPBX 10000 UNIT/ML IJ SOLN
INTRAMUSCULAR | Status: AC
  Filled 2019-04-09: qty 2

## 2019-04-09 MED ORDER — EPOETIN ALFA-EPBX 10000 UNIT/ML IJ SOLN
20000.0000 [IU] | Freq: Once | INTRAMUSCULAR | Status: AC
  Administered 2019-04-09: 20000 [IU] via SUBCUTANEOUS

## 2019-04-09 MED ORDER — EPOETIN ALFA-EPBX 40000 UNIT/ML IJ SOLN
INTRAMUSCULAR | Status: AC
  Filled 2019-04-09: qty 1

## 2019-04-09 NOTE — Patient Instructions (Signed)

## 2019-04-13 ENCOUNTER — Other Ambulatory Visit: Payer: Self-pay

## 2019-04-13 ENCOUNTER — Ambulatory Visit: Payer: Medicare Other | Admitting: Podiatry

## 2019-04-13 VITALS — Temp 98.0°F

## 2019-04-13 DIAGNOSIS — E11621 Type 2 diabetes mellitus with foot ulcer: Secondary | ICD-10-CM

## 2019-04-13 DIAGNOSIS — L97529 Non-pressure chronic ulcer of other part of left foot with unspecified severity: Secondary | ICD-10-CM

## 2019-04-13 NOTE — Progress Notes (Signed)
  Subjective:  Patient ID: Paul [name], male    DOB: 1940-02-10,  MRN: [id number]  Chief Complaint  Patient presents with  . Wound Check    L hallux. Pt stated, "It's doing well. No pain/unusual drainage/foul odors/fever/chills/N&V. I'm using Silvadene".  . Diabetes    HgbA1c = 5.7 per pt.   79 y.o. male presents for wound care. Hx confirmed with patient.  Objective:  Physical Exam: Wound Location: left hallux Wound Measurement: 0.2x0.3 Wound Base: Granular/Healthy Peri-wound: Calloused Exudate: Scant/small amount Serosanguinous exudate wound without warmth, erythema, signs of acute infection  Assessment:   1. Diabetic ulcer of left great toe [facility]      Plan:  Patient was evaluated and treated and all questions answered.  Ulcer Left hallux -Continue surgical shoe -Debrided as below  Procedure: Selective Debridement of Wound Rationale: Removal of devitalized tissue from the wound to promote healing.  Pre-Debridement Wound Measurements: 0.3 cm x 0.2 x cm x 0.1 cm  Post-Debridement Wound Measurements: same as pre-debridement. Type of Debridement: sharp selective Tissue Removed: Devitalized soft-tissue Dressing: Dry, sterile, compression dressing. Disposition: Patient tolerated procedure well. Patient to return in 1 week for follow-up.   Return in about 3 weeks (around 05/04/2019) for Wound Care.

## 2019-04-20 ENCOUNTER — Other Ambulatory Visit: Payer: Self-pay

## 2019-04-20 DIAGNOSIS — D638 Anemia in other chronic diseases classified elsewhere: Secondary | ICD-10-CM

## 2019-04-20 DIAGNOSIS — D469 Myelodysplastic syndrome, unspecified: Secondary | ICD-10-CM

## 2019-04-23 ENCOUNTER — Inpatient Hospital Stay: Payer: Medicare Other

## 2019-04-23 ENCOUNTER — Other Ambulatory Visit: Payer: Self-pay

## 2019-04-23 VITALS — BP 156/54 | HR 60 | Temp 98.3°F | Resp 18

## 2019-04-23 DIAGNOSIS — I129 Hypertensive chronic kidney disease with stage 1 through stage 4 chronic kidney disease, or unspecified chronic kidney disease: Secondary | ICD-10-CM | POA: Diagnosis not present

## 2019-04-23 DIAGNOSIS — D638 Anemia in other chronic diseases classified elsewhere: Secondary | ICD-10-CM

## 2019-04-23 DIAGNOSIS — D469 Myelodysplastic syndrome, unspecified: Secondary | ICD-10-CM

## 2019-04-23 DIAGNOSIS — Z79899 Other long term (current) drug therapy: Secondary | ICD-10-CM | POA: Diagnosis not present

## 2019-04-23 DIAGNOSIS — D631 Anemia in chronic kidney disease: Secondary | ICD-10-CM | POA: Diagnosis not present

## 2019-04-23 DIAGNOSIS — N183 Chronic kidney disease, stage 3 unspecified: Secondary | ICD-10-CM | POA: Diagnosis not present

## 2019-04-23 LAB — CBC WITH DIFFERENTIAL (CANCER CENTER ONLY)
Abs Immature Granulocytes: 0.23 10*3/uL — ABNORMAL HIGH (ref 0.00–0.07)
Basophils Absolute: 0.3 10*3/uL — ABNORMAL HIGH (ref 0.0–0.1)
Basophils Relative: 2 %
Eosinophils Absolute: 0.5 10*3/uL (ref 0.0–0.5)
Eosinophils Relative: 4 %
HCT: 30 % — ABNORMAL LOW (ref 39.0–52.0)
Hemoglobin: 10.1 g/dL — ABNORMAL LOW (ref 13.0–17.0)
Immature Granulocytes: 2 %
Lymphocytes Relative: 24 %
Lymphs Abs: 3.2 10*3/uL (ref 0.7–4.0)
MCH: 28.4 pg (ref 26.0–34.0)
MCHC: 33.7 g/dL (ref 30.0–36.0)
MCV: 84.3 fL (ref 80.0–100.0)
Monocytes Absolute: 0.5 10*3/uL (ref 0.1–1.0)
Monocytes Relative: 4 %
Neutro Abs: 8.6 10*3/uL — ABNORMAL HIGH (ref 1.7–7.7)
Neutrophils Relative %: 64 %
Platelet Count: 179 10*3/uL (ref 150–400)
RBC: 3.56 MIL/uL — ABNORMAL LOW (ref 4.22–5.81)
RDW: 18.8 % — ABNORMAL HIGH (ref 11.5–15.5)
WBC Count: 13.3 10*3/uL — ABNORMAL HIGH (ref 4.0–10.5)
nRBC: 0 % (ref 0.0–0.2)

## 2019-04-23 MED ORDER — EPOETIN ALFA-EPBX 10000 UNIT/ML IJ SOLN
20000.0000 [IU] | Freq: Once | INTRAMUSCULAR | Status: AC
  Administered 2019-04-23: 20000 [IU] via SUBCUTANEOUS

## 2019-04-23 MED ORDER — EPOETIN ALFA-EPBX 10000 UNIT/ML IJ SOLN
INTRAMUSCULAR | Status: AC
  Filled 2019-04-23: qty 2

## 2019-04-23 NOTE — Patient Instructions (Signed)

## 2019-04-26 NOTE — Progress Notes (Signed)
[place]   [phone] [phone]   Clinic Follow up Note   Patient Care Team: Denita Lung, MD as PCP - General (Family Medicine) Burnell Blanks, MD as PCP - Cardiology (Cardiology)  Date of Service:  05/07/2019  CHIEF COMPLAINT: F/u of MDS/MPN, with mildleukocytosis and moderate anemia,JAK2 V617F (-)  CURRENT THERAPY:  Retacrit monthly started 10/30/18. Will increase to every 2 weeks starting 01/29/19. Increased to weekly on 05/07/19.   INTERVAL HISTORY:  Paul [name] is here for a follow up. He presents to the clinic alone. He notes he was feeling stable but last week he started feeling lowered energy. He will feel good for 2 days after injection then back to lower energy. He notes he lives less than 15 minutes. He notes he is able to come here weekly.     REVIEW OF SYSTEMS:   Constitutional: Denies fevers, chills or abnormal weight loss Eyes: Denies blurriness of vision Ears, nose, mouth, throat, and face: Denies mucositis or sore throat Respiratory: Denies cough, dyspnea or wheezes Cardiovascular: Denies palpitation, chest discomfort or lower extremity swelling Gastrointestinal:  Denies nausea, heartburn or change in bowel habits Skin: Denies abnormal skin rashes Lymphatics: Denies new lymphadenopathy or easy bruising Neurological:Denies numbness, tingling or new weaknesses Behavioral/Psych: Mood is stable, no new changes  All other systems were reviewed with the patient and are negative.  MEDICAL HISTORY:  Past Medical History:  Diagnosis Date  . AAA (abdominal aortic aneurysm) ([place])   . Abnormal LFTs   . AKI (acute kidney injury) ([place])   . Anemia of chronic disease   . Arthritis    "all over" (12/04/2015)  . Atherosclerosis of native coronary artery of native heart without angina pectoris   . CAD (coronary artery disease)    with cath 09/26/08 showing severe heart disease in RCA with POBA, mild to mod diesease in  circ and LAD  . Chronic back pain    back problems prior to surgery   . Constipation   . Coronary atherosclerosis of native coronary artery   . Dehydration   . Diabetes mellitus    with neuropathy  . Diarrhea   . Elevated liver enzymes   . Generalized weakness   . Gout of right hand   . Hiatal hernia   . History of gastroesophageal reflux (GERD)   . History of stomach ulcers   . Hyperlipidemia   . Hypertension   . Metabolic acidosis   . Normochromic anemia 02/23/2016  . 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 Thrombocytopenia ([place])   . Unintentional weight loss 09/27/2017   33 lbs between 4/19 & 8/19  . Vitamin B12 deficiency   . Vitamin B12 deficiency   . Vitamin D deficiency   . Weakness     SURGICAL HISTORY: Past Surgical History:  Procedure Laterality Date  . ANKLE FRACTURE SURGERY Left    "crushed"  . BACK SURGERY  X 3   "fell 4 stories in 1977"  . CARPAL TUNNEL RELEASE Left   . CORONARY ANGIOPLASTY WITH STENT PLACEMENT  09/26/2008   [name] 06/03/2010  . FRACTURE SURGERY    . LEFT HEART CATHETERIZATION WITH CORONARY ANGIOGRAM N/A 01/21/2012   Procedure: LEFT HEART CATHETERIZATION WITH CORONARY ANGIOGRAM;  Surgeon: Burnell Blanks, MD;  Location: [facility] CATH LAB;  Service: Cardiovascular;  Laterality: N/A;  . LUMBAR [place]; ~ 1980  . POSTERIOR LUMBAR FUSION  ~ 1998   "3rd OR to reconstruct my back after  fall in 1977"  . 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  ALLERGIES:  is allergic to allopurinol and metformin and related.  MEDICATIONS:  Current Outpatient Medications  Medication Sig Dispense Refill  . acetaminophen (TYLENOL) 500 MG tablet Take 1,000 mg by mouth every 6 (six) hours as needed.    Marland Kitchen amLODipine (NORVASC) 5 MG tablet Take 1 tablet (5 mg total) by mouth daily. 90 tablet 3  . aspirin 81 MG chewable tablet Chew 81 mg by mouth  daily.    . clopidogrel (PLAVIX) 75 MG tablet TAKE 1 TABLET BY MOUTH  DAILY 90 tablet 3  . ENBREL SURECLICK 50 MG/ML injection Inject into the skin once a week.    . febuxostat (ULORIC) 40 MG tablet Take 40 mg by mouth daily.    . fish oil-omega-3 fatty acids 1000 MG capsule Take 1 g by mouth 2 (two) times daily.     Marland Kitchen glucose blood (ACCU-CHEK AVIVA PLUS) test strip Test once to twice daily 200 each 4  . isosorbide mononitrate (IMDUR) 30 MG 24 hr tablet Take one tablet by mouth every morning, take two tablets by mouth every evening 270 tablet 3  . leflunomide (ARAVA) 20 MG tablet Take 20 mg by mouth daily.    . metoprolol tartrate (LOPRESSOR) 25 MG tablet Take 1 tablet (25 mg total) by mouth 2 (two) times daily. 180 tablet 3  . Multiple Vitamins-Minerals (CENTRUM) tablet Take 1 tablet by mouth daily.    . nitroGLYCERIN (NITROSTAT) 0.4 MG SL tablet Place 0.4 mg under the tongue every 5 (five) minutes as needed for chest pain. For 3 doses    . pyridoxine (B-6) 100 MG tablet Take 100 mg by mouth daily.    . silver sulfADIAZINE (SILVADENE) 1 % cream Apply pea-sized amount to wound daily. 50 g 0  . simvastatin (ZOCOR) 20 MG tablet Take 1 tablet (20 mg total) by mouth at bedtime. 90 tablet 3   No current facility-administered medications for this visit.    PHYSICAL EXAMINATION: ECOG PERFORMANCE STATUS: 2 - Symptomatic, <50% confined to bed  Vitals:   05/07/19 1413  BP: (!) 170/52  Pulse: 70  Resp: 18  Temp: 98.9 F (37.2 C)  SpO2: 98%   Filed Weights   05/07/19 1413  Weight: 192 lb 14.4 oz (87.5 kg)    Due to COVID19 we will limit examination to appearance. Patient had no complaints.  GENERAL:alert, no distress and comfortable SKIN: skin color normal, no rashes or significant lesions EYES: normal, Conjunctiva are pink and non-injected, sclera clear  NEURO: alert & oriented x 3 with fluent speech HEART: regular rate & rhythm and no murmurs and no lower extremity edema  LABORATORY  DATA:  I have reviewed the data as listed CBC Latest Ref Rng & Units 05/07/2019 04/23/2019 04/09/2019  WBC 4.0 - 10.5 K/uL 16.8(H) 13.3(H) 18.6(H)  Hemoglobin 13.0 - 17.0 g/dL 10.0(L) 10.1(L) 10.0(L)  Hematocrit 39.0 - 52.0 % 30.2(L) 30.0(L) 30.0(L)  Platelets 150 - 400 K/uL 160 179 329     CMP Latest Ref Rng & Units 01/17/2019 09/02/2018 09/02/2018  Glucose 65 - 99 mg/dL 156(H) - 154(H)  BUN 8 - 27 mg/dL 24 - 21  Creatinine 0.76 - 1.27 mg/dL 1.17 1.24 1.14  Sodium 134 - 144 mmol/L 134 - 136  Potassium 3.5 - 5.2 mmol/L 5.4(H) -  3.8  Chloride 96 - 106 mmol/L 103 - 108  CO2 20 - 29 mmol/L 20 - 21(L)  Calcium 8.6 - 10.2 mg/dL 8.9 - 8.8(L)  Total Protein 6.0 - 8.5 g/dL 7.4 - 7.1  Total Bilirubin 0.0 - 1.2 mg/dL 0.7 - 0.9  Alkaline Phos 39 - 117 IU/L 126(H) - 71  AST 0 - 40 IU/L 29 - 29  ALT 0 - 44 IU/L 18 - 21      RADIOGRAPHIC STUDIES: I have personally reviewed the radiological images as listed and agreed with the findings in the report. No results found.   ASSESSMENT & PLAN:  Paul [name] is a 79 y.o. male with    1. MDS/MPN, JAK2 (-), cytogenetics normal -Hehas had anemia issues since mid 2017 andHghasin 9-10 range most of time, he also has mild leukocytosis, thrombocytosisresolved now -Other contributions to his anemia include stage III chronic renal insufficiency and chronic gouty arthritis.Previous iron study and B12 level were normal. 11/4/19Bone marrow is consistent with a myeloproliferative disorder with dysplastic features. -His leukocytosis is very mild, no thrombocytosis, risk of thrombosis is not very high, no need treatment such as hydrea.He is currently on aspirin and Plavix, which also helps to prevent thrombosis. -He was on oral iron, but stopped due to constipation. -His anemia has worsened so I started him on treatment with monthly Retacrit beginning 10/30/18. Aranesp was not approved by his insurance. Retacrit increased to every 2 weeks on 01/29/19.   -Clinically he still experienced lower energy, especially the second week after injection. I discussed increasing activity at home and to elevate legs when sitting to reduce risk of blood clots.  -I also discussed increasing frequency of Retacrit to weekly. He is agreeable. CBC reviewed, WBC 16.8, Hg 10, Hct 30.2%, ANC 11.3. Will proceed with Retacrit today then continue weekly.  -F/u in 2 months  -I encouraged him to follow up with PCP and other physicians for other age appropriate cancer screenings.   2.Gout, RA  -Had allergic reaction to Allopurinol  -Now controlled withcolchicine and Uloric -For his RA he has started Enbrel. He has received 3 injections so far.    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 He currently smokes 1/2ppd.  -I reviewed his CT chest from 10/2019with pt in personwhich shows mild emphysema and small rightpleural effusion,pulmonary parenchymal fibrosis, probable UIP. strongly encouraged him to stop smoking to prevent further exacerbation and may need oxygen.  -I recommend he bee seen by Pulmonology for further workup.He will think about it  4. AAA, CAD with stent placed -He notes his AAA has decreased and is being followed by Dr. Doren Custard, vascular surgeon. -He does have blockage in heart  -On Plavixand ASA  5.Mild liver cirrhosis -He has long intermittent history of moderatealcoholdrinking  -CT AP from 10/2017 showed nodularity suspicious of cirrhosis.  -monitor LFTs  6. H/o ofright LEDVT, provoked -Treated with 3 months of Xarelto, off now -I encouraged him to increase physical activity at home and elevate legs when sitting. I encouraged him to watch for leg swelling, such as one side larger than the other.    PLAN: -Proceed with Retacrit today and will increase to every week  -lab and Retacrit every weekly X8 -F/u in 2 months with lab   No problem-specific Assessment &  Plan notes found for this encounter.   No orders of the defined types were placed in this encounter.  All questions  were answered. The patient knows to call the clinic with any problems, questions or concerns. No barriers to learning was detected. The total time spent in the appointment was 20 minutes.     Truitt Merle, MD 05/07/2019   I, Joslyn Devon, am acting as scribe for Truitt Merle, MD.   I have reviewed the above documentation for accuracy and completeness, and I agree with the above.

## 2019-05-03 ENCOUNTER — Ambulatory Visit (INDEPENDENT_AMBULATORY_CARE_PROVIDER_SITE_OTHER): Payer: Medicare Other | Admitting: Podiatry

## 2019-05-03 ENCOUNTER — Other Ambulatory Visit: Payer: Self-pay

## 2019-05-03 VITALS — Temp 97.0°F

## 2019-05-03 DIAGNOSIS — L97529 Non-pressure chronic ulcer of other part of left foot with unspecified severity: Secondary | ICD-10-CM | POA: Diagnosis not present

## 2019-05-03 DIAGNOSIS — M19079 Primary osteoarthritis, unspecified ankle and foot: Secondary | ICD-10-CM

## 2019-05-03 DIAGNOSIS — E11621 Type 2 diabetes mellitus with foot ulcer: Secondary | ICD-10-CM | POA: Diagnosis not present

## 2019-05-03 NOTE — Progress Notes (Signed)
  Subjective:  Patient ID: Paul [name], male    DOB: [birth year],  MRN: [id number]  Chief Complaint  Patient presents with  . Wound Check    Pt states improvement and denies fever/chills/nausea/vomiting.    79 y.o. male presents for wound care. Hx confirmed with patient.  Objective:  Physical Exam: Wound Location: left hallux Wound Measurement: 0.2x0.3 Wound Base: Granular/Healthy Peri-wound: Calloused Exudate: Scant/small amount Serosanguinous exudate wound without warmth, erythema, signs of acute infection  Assessment:   1. Diabetic ulcer of left great toe ([place])   2. Arthritis of big toe      Plan:  Patient was evaluated and treated and all questions answered.  Ulcer Left hallux; Hallux IPJ Arthritis -Ulcer debrided as below -Should pain persist would consider hallux IPJ arthroplasty. Discussed with patient.  Procedure: Selective Debridement of Wound Rationale: Removal of devitalized tissue from the wound to promote healing.  Pre-Debridement Wound Measurements: 0.3 cm x 0.3 cm x 0.1 cm  Post-Debridement Wound Measurements: same as pre-debridement. Type of Debridement: sharp selective Tissue Removed: Devitalized soft-tissue Dressing: Dry, sterile, compression dressing. Disposition: Patient tolerated procedure well. Patient to return in 1 week for follow-up.     No follow-ups on file.

## 2019-05-07 ENCOUNTER — Inpatient Hospital Stay: Payer: Medicare Other

## 2019-05-07 ENCOUNTER — Inpatient Hospital Stay (HOSPITAL_BASED_OUTPATIENT_CLINIC_OR_DEPARTMENT_OTHER): Payer: Medicare Other | Admitting: Hematology

## 2019-05-07 ENCOUNTER — Encounter: Payer: Self-pay | Admitting: Hematology

## 2019-05-07 ENCOUNTER — Inpatient Hospital Stay: Payer: Medicare Other | Attending: Hematology

## 2019-05-07 ENCOUNTER — Other Ambulatory Visit: Payer: Self-pay

## 2019-05-07 VITALS — BP 170/52 | HR 70 | Temp 98.9°F | Resp 18 | Ht 70.0 in | Wt 192.9 lb

## 2019-05-07 DIAGNOSIS — D469 Myelodysplastic syndrome, unspecified: Secondary | ICD-10-CM | POA: Diagnosis not present

## 2019-05-07 DIAGNOSIS — K219 Gastro-esophageal reflux disease without esophagitis: Secondary | ICD-10-CM | POA: Diagnosis not present

## 2019-05-07 DIAGNOSIS — I714 Abdominal aortic aneurysm, without rupture: Secondary | ICD-10-CM | POA: Diagnosis not present

## 2019-05-07 DIAGNOSIS — D631 Anemia in chronic kidney disease: Secondary | ICD-10-CM | POA: Insufficient documentation

## 2019-05-07 DIAGNOSIS — Z955 Presence of coronary angioplasty implant and graft: Secondary | ICD-10-CM | POA: Diagnosis not present

## 2019-05-07 DIAGNOSIS — N183 Chronic kidney disease, stage 3 unspecified: Secondary | ICD-10-CM | POA: Diagnosis not present

## 2019-05-07 DIAGNOSIS — J439 Emphysema, unspecified: Secondary | ICD-10-CM | POA: Insufficient documentation

## 2019-05-07 DIAGNOSIS — D638 Anemia in other chronic diseases classified elsewhere: Secondary | ICD-10-CM

## 2019-05-07 DIAGNOSIS — Z86718 Personal history of other venous thrombosis and embolism: Secondary | ICD-10-CM | POA: Diagnosis not present

## 2019-05-07 DIAGNOSIS — I129 Hypertensive chronic kidney disease with stage 1 through stage 4 chronic kidney disease, or unspecified chronic kidney disease: Secondary | ICD-10-CM | POA: Diagnosis not present

## 2019-05-07 DIAGNOSIS — E559 Vitamin D deficiency, unspecified: Secondary | ICD-10-CM | POA: Insufficient documentation

## 2019-05-07 DIAGNOSIS — E1122 Type 2 diabetes mellitus with diabetic chronic kidney disease: Secondary | ICD-10-CM | POA: Diagnosis not present

## 2019-05-07 DIAGNOSIS — E538 Deficiency of other specified B group vitamins: Secondary | ICD-10-CM | POA: Insufficient documentation

## 2019-05-07 DIAGNOSIS — Z79899 Other long term (current) drug therapy: Secondary | ICD-10-CM | POA: Diagnosis not present

## 2019-05-07 DIAGNOSIS — K7460 Unspecified cirrhosis of liver: Secondary | ICD-10-CM | POA: Insufficient documentation

## 2019-05-07 DIAGNOSIS — I2510 Atherosclerotic heart disease of native coronary artery without angina pectoris: Secondary | ICD-10-CM | POA: Diagnosis not present

## 2019-05-07 DIAGNOSIS — E785 Hyperlipidemia, unspecified: Secondary | ICD-10-CM | POA: Insufficient documentation

## 2019-05-07 LAB — CBC WITH DIFFERENTIAL (CANCER CENTER ONLY)
Abs Immature Granulocytes: 0.69 10*3/uL — ABNORMAL HIGH (ref 0.00–0.07)
Basophils Absolute: 0.2 10*3/uL — ABNORMAL HIGH (ref 0.0–0.1)
Basophils Relative: 1 %
Eosinophils Absolute: 0.6 10*3/uL — ABNORMAL HIGH (ref 0.0–0.5)
Eosinophils Relative: 3 %
HCT: 30.2 % — ABNORMAL LOW (ref 39.0–52.0)
Hemoglobin: 10 g/dL — ABNORMAL LOW (ref 13.0–17.0)
Immature Granulocytes: 4 %
Lymphocytes Relative: 21 %
Lymphs Abs: 3.5 10*3/uL (ref 0.7–4.0)
MCH: 28 pg (ref 26.0–34.0)
MCHC: 33.1 g/dL (ref 30.0–36.0)
MCV: 84.6 fL (ref 80.0–100.0)
Monocytes Absolute: 0.5 10*3/uL (ref 0.1–1.0)
Monocytes Relative: 3 %
Neutro Abs: 11.3 10*3/uL — ABNORMAL HIGH (ref 1.7–7.7)
Neutrophils Relative %: 68 %
Platelet Count: 160 10*3/uL (ref 150–400)
RBC: 3.57 MIL/uL — ABNORMAL LOW (ref 4.22–5.81)
RDW: 18.9 % — ABNORMAL HIGH (ref 11.5–15.5)
WBC Count: 16.8 10*3/uL — ABNORMAL HIGH (ref 4.0–10.5)
nRBC: 0 % (ref 0.0–0.2)

## 2019-05-07 MED ORDER — EPOETIN ALFA-EPBX 10000 UNIT/ML IJ SOLN
INTRAMUSCULAR | Status: AC
  Filled 2019-05-07: qty 2

## 2019-05-07 MED ORDER — EPOETIN ALFA-EPBX 10000 UNIT/ML IJ SOLN
20000.0000 [IU] | Freq: Once | INTRAMUSCULAR | Status: AC
  Administered 2019-05-07: 20000 [IU] via SUBCUTANEOUS

## 2019-05-07 NOTE — Patient Instructions (Signed)

## 2019-05-08 ENCOUNTER — Telehealth: Payer: Self-pay

## 2019-05-08 NOTE — Telephone Encounter (Signed)
.  mychart

## 2019-05-09 ENCOUNTER — Telehealth: Payer: Self-pay | Admitting: Hematology

## 2019-05-09 NOTE — Telephone Encounter (Signed)
Scheduled appts per 4/5 los. Pt confirmed next appt date and time. Pt stated he would refer to mychart.

## 2019-05-14 ENCOUNTER — Other Ambulatory Visit: Payer: Self-pay

## 2019-05-14 ENCOUNTER — Inpatient Hospital Stay: Payer: Medicare Other

## 2019-05-14 VITALS — BP 148/52 | HR 58 | Temp 98.7°F | Resp 18

## 2019-05-14 DIAGNOSIS — K7460 Unspecified cirrhosis of liver: Secondary | ICD-10-CM | POA: Diagnosis not present

## 2019-05-14 DIAGNOSIS — J439 Emphysema, unspecified: Secondary | ICD-10-CM | POA: Diagnosis not present

## 2019-05-14 DIAGNOSIS — Z955 Presence of coronary angioplasty implant and graft: Secondary | ICD-10-CM | POA: Diagnosis not present

## 2019-05-14 DIAGNOSIS — D469 Myelodysplastic syndrome, unspecified: Secondary | ICD-10-CM

## 2019-05-14 DIAGNOSIS — E1122 Type 2 diabetes mellitus with diabetic chronic kidney disease: Secondary | ICD-10-CM | POA: Diagnosis not present

## 2019-05-14 DIAGNOSIS — E785 Hyperlipidemia, unspecified: Secondary | ICD-10-CM | POA: Diagnosis not present

## 2019-05-14 DIAGNOSIS — I714 Abdominal aortic aneurysm, without rupture: Secondary | ICD-10-CM | POA: Diagnosis not present

## 2019-05-14 DIAGNOSIS — Z79899 Other long term (current) drug therapy: Secondary | ICD-10-CM | POA: Diagnosis not present

## 2019-05-14 DIAGNOSIS — E559 Vitamin D deficiency, unspecified: Secondary | ICD-10-CM | POA: Diagnosis not present

## 2019-05-14 DIAGNOSIS — K219 Gastro-esophageal reflux disease without esophagitis: Secondary | ICD-10-CM | POA: Diagnosis not present

## 2019-05-14 DIAGNOSIS — D638 Anemia in other chronic diseases classified elsewhere: Secondary | ICD-10-CM

## 2019-05-14 DIAGNOSIS — Z86718 Personal history of other venous thrombosis and embolism: Secondary | ICD-10-CM | POA: Diagnosis not present

## 2019-05-14 DIAGNOSIS — I129 Hypertensive chronic kidney disease with stage 1 through stage 4 chronic kidney disease, or unspecified chronic kidney disease: Secondary | ICD-10-CM | POA: Diagnosis not present

## 2019-05-14 DIAGNOSIS — D631 Anemia in chronic kidney disease: Secondary | ICD-10-CM | POA: Diagnosis not present

## 2019-05-14 DIAGNOSIS — N183 Chronic kidney disease, stage 3 unspecified: Secondary | ICD-10-CM | POA: Diagnosis not present

## 2019-05-14 DIAGNOSIS — C946 Myelodysplastic disease, not classified: Secondary | ICD-10-CM

## 2019-05-14 DIAGNOSIS — I2510 Atherosclerotic heart disease of native coronary artery without angina pectoris: Secondary | ICD-10-CM | POA: Diagnosis not present

## 2019-05-14 DIAGNOSIS — E538 Deficiency of other specified B group vitamins: Secondary | ICD-10-CM | POA: Diagnosis not present

## 2019-05-14 LAB — CBC WITH DIFFERENTIAL (CANCER CENTER ONLY)
Abs Immature Granulocytes: 0.73 10*3/uL — ABNORMAL HIGH (ref 0.00–0.07)
Basophils Absolute: 0.3 10*3/uL — ABNORMAL HIGH (ref 0.0–0.1)
Basophils Relative: 2 %
Eosinophils Absolute: 0.6 10*3/uL — ABNORMAL HIGH (ref 0.0–0.5)
Eosinophils Relative: 4 %
HCT: 31.2 % — ABNORMAL LOW (ref 39.0–52.0)
Hemoglobin: 10.3 g/dL — ABNORMAL LOW (ref 13.0–17.0)
Immature Granulocytes: 4 %
Lymphocytes Relative: 23 %
Lymphs Abs: 4.2 10*3/uL — ABNORMAL HIGH (ref 0.7–4.0)
MCH: 27.2 pg (ref 26.0–34.0)
MCHC: 33 g/dL (ref 30.0–36.0)
MCV: 82.5 fL (ref 80.0–100.0)
Monocytes Absolute: 0.5 10*3/uL (ref 0.1–1.0)
Monocytes Relative: 3 %
Neutro Abs: 11.7 10*3/uL — ABNORMAL HIGH (ref 1.7–7.7)
Neutrophils Relative %: 64 %
Platelet Count: 210 10*3/uL (ref 150–400)
RBC: 3.78 MIL/uL — ABNORMAL LOW (ref 4.22–5.81)
RDW: 18.9 % — ABNORMAL HIGH (ref 11.5–15.5)
WBC Count: 18.1 10*3/uL — ABNORMAL HIGH (ref 4.0–10.5)
nRBC: 0 % (ref 0.0–0.2)

## 2019-05-14 MED ORDER — EPOETIN ALFA-EPBX 10000 UNIT/ML IJ SOLN
INTRAMUSCULAR | Status: AC
  Filled 2019-05-14: qty 2

## 2019-05-14 MED ORDER — EPOETIN ALFA-EPBX 10000 UNIT/ML IJ SOLN
20000.0000 [IU] | Freq: Once | INTRAMUSCULAR | Status: AC
  Administered 2019-05-14: 20000 [IU] via SUBCUTANEOUS

## 2019-05-14 NOTE — Patient Instructions (Signed)

## 2019-05-15 NOTE — Progress Notes (Signed)
0  Chief Complaint  Patient presents with  . Follow-up    CAD   History of Present Illness: 79 yo male with history of CAD, DM, bilateral carotid artery disease, HTN, HLD and DVT here today for cardiology follow up. His echo in 2009 showed mild LVH and normal LV function with no significant valvular abnormalities. His cath in 2009 showed a near total occlusion of the RCA beyond the stent in the mid portion. I attempted PCI and was able to balloon the mid lesion but could never pass a balloon distally. He did well after that.  Cardiac cath in December 2013 in the setting of worsened chest pain/dsyspnea showed total occlusion of the RCA which was not amenable to PCI (PCI attempted in 2009). The LAD and Circumflex had moderate disease. Normal ABI 10/01/13. Carotid artery dopplers July 2018 with stable moderate bilateral ICA stenosis. He was found to have a DVT in November 2017 and was treated with Xarelto. He is off of Xarelto and back on ASA and Plavix. He has been followed by in the Heme/Onc clinic for unintentional weight loss and chronic macrocytic anemia. Malignancy workup was negative. Chest pain in August 2020 and admitted overnight with negative troponin. His chest pain occurred after eating two burgers. Echo 09/02/18 with LVEF=60-65%. Moderate AI. Mild AS. I saw him in October 2020 and he c/o chest pain at night and worsened when he was anemic. I saw him in January 2021 and we discussed having his anemia treated before any invasive cardiac workup. His Imdur was increased.   He is here today for follow up. The patient denies any chest pain, dyspnea, palpitations, lower extremity edema, orthopnea, PND, dizziness, near syncope or syncope.   Primary Care Physician: Denita Lung, MD  Past Medical History:  Diagnosis Date  . AAA (abdominal aortic aneurysm) ([place])   . Abnormal LFTs   . AKI (acute kidney injury) ([place])   . Anemia of chronic disease   . Arthritis    "all over" (12/04/2015)  .  Atherosclerosis of native coronary artery of native heart without angina pectoris   . CAD (coronary artery disease)    with cath 09/26/08 showing severe heart disease in RCA with POBA, mild to mod diesease in circ and LAD  . Chronic back pain    back problems prior to surgery   . Constipation   . Coronary atherosclerosis of native coronary artery   . Dehydration   . Diabetes mellitus    with neuropathy  . Diarrhea   . Elevated liver enzymes   . Generalized weakness   . Gout of right hand   . Hiatal hernia   . History of gastroesophageal reflux (GERD)   . History of stomach ulcers   . Hyperlipidemia   . Hypertension   . Metabolic acidosis   . Normochromic anemia 02/23/2016  . 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 Thrombocytopenia ([place])   . Unintentional weight loss 09/27/2017   33 lbs between 4/19 & 8/19  . Vitamin B12 deficiency   . Vitamin B12 deficiency   . Vitamin D deficiency   . Weakness     Past Surgical History:  Procedure Laterality Date  . ANKLE FRACTURE SURGERY Left    "crushed"  . BACK SURGERY  X 3   "fell 4 stories in 1977"  . CARPAL TUNNEL RELEASE Left   . CORONARY ANGIOPLASTY WITH STENT PLACEMENT  09/26/2008   [name] 06/03/2010  . FRACTURE SURGERY    . LEFT HEART CATHETERIZATION  WITH CORONARY ANGIOGRAM N/A 01/21/2012   Procedure: LEFT HEART CATHETERIZATION WITH CORONARY ANGIOGRAM;  Surgeon: Burnell Blanks, MD;  Location: [facility] CATH LAB;  Service: Cardiovascular;  Laterality: N/A;  . LUMBAR [place]; ~ 1980  . POSTERIOR LUMBAR FUSION  ~ 1998   "3rd OR to reconstruct my back after fall in 1977"  . 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 amLODipine (NORVASC) 5 MG tablet Take 1 tablet (5 mg total) by mouth daily. 90 tablet 3  . aspirin 81 MG chewable tablet Chew 81 mg by mouth daily.    . clopidogrel (PLAVIX) 75 MG tablet TAKE 1 TABLET BY MOUTH  DAILY 90 tablet 3  . ENBREL  SURECLICK 50 MG/ML injection Inject into the skin once a week.    . febuxostat (ULORIC) 40 MG tablet Take 40 mg by mouth daily.    . fish oil-omega-3 fatty acids 1000 MG capsule Take 1 g by mouth 2 (two) times daily.     Marland Kitchen glucose blood (ACCU-CHEK AVIVA PLUS) test strip Test once to twice daily 200 each 4  . isosorbide mononitrate (IMDUR) 30 MG 24 hr tablet Take one tablet by mouth every morning, take two tablets by mouth every evening 270 tablet 3  . metoprolol tartrate (LOPRESSOR) 25 MG tablet Take 1 tablet (25 mg total) by mouth 2 (two) times daily. 180 tablet 3  . Multiple Vitamins-Minerals (CENTRUM) tablet Take 1 tablet by mouth daily.    . nitroGLYCERIN (NITROSTAT) 0.4 MG SL tablet Place 0.4 mg under the tongue every 5 (five) minutes as needed for chest pain. For 3 doses    . pyridoxine (B-6) 100 MG tablet Take 100 mg by mouth daily.    . silver sulfADIAZINE (SILVADENE) 1 % cream Apply pea-sized amount to wound daily. 50 g 0  . simvastatin (ZOCOR) 20 MG tablet Take 1 tablet (20 mg total) by mouth at bedtime. 90 tablet 3   No current facility-administered medications for this visit.    Allergies  Allergen Reactions  . Allopurinol Other (See Comments)    thrombocytopenia  . Metformin And Related Diarrhea    Social History   Socioeconomic History  . Marital status: Married    Spouse name: Scientist, physiological  . Number of children: 0  . Years of education: Not on file  . Highest education level: Not on file  Occupational History  . Not on file  Tobacco Use  . Smoking status: Current Every Day Smoker    Packs/day: 0.50    Years: 20.00    Pack years: 10.00    Types: Cigars    Last attempt to quit: 01/24/2013    Years since quitting: 6.3  . Smokeless tobacco: Never Used  Substance and Sexual Activity  . Alcohol use: Not Currently  . Drug use: No  . Sexual activity: Not on file  Other Topics Concern  . Not on file  Social History Narrative   Divorced, 1 child.    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    Owned roofing co. In [place]   Social Determinants of Health   Financial Resource Strain:   . Difficulty of Paying Living Expenses:   Food Insecurity:   . Worried About Charity fundraiser in the Last Year:   . Arboriculturist in the Last Year:   Transportation Needs:   . Lack  of Transportation (Medical):   Marland Kitchen Lack of Transportation (Non-Medical):   Physical Activity:   . Days of Exercise per Week:   . Minutes of Exercise per Session:   Stress:   . Feeling of Stress :   Social Connections:   . Frequency of Communication with Friends and Family:   . Frequency of Social Gatherings with Friends and Family:   . Attends Religious Services:   . Active Member of Clubs or Organizations:   . Attends Archivist Meetings:   Marland Kitchen Marital Status:   Intimate Partner Violence:   . Fear of Current or Ex-Partner:   . Emotionally Abused:   Marland Kitchen Physically Abused:   . Sexually Abused:     Family History  Problem Relation Age of Onset  . Heart attack Mother   . Cancer Mother   . Heart disease Mother   . Heart attack Sister   . Heart disease Sister     Review of Systems:  As stated in the HPI and otherwise negative.   BP 134/60   Pulse 61   Ht 5\' 10"  (1.778 m)   Wt 192 lb 9.6 oz (87.4 kg)   SpO2 96%   BMI 27.64 kg/m   Physical Examination:  General: Well developed, well nourished, NAD  HEENT: OP clear, mucus membranes moist  SKIN: warm, dry. No rashes. Neuro: No focal deficits  Musculoskeletal: Muscle strength 5/5 all ext  Psychiatric: Mood and affect normal  Neck: No JVD, no carotid bruits, no thyromegaly, no lymphadenopathy.  Lungs:Clear bilaterally, no wheezes, rhonci, crackles Cardiovascular: Regular rate and rhythm. No murmurs, gallops or rubs. Abdomen:Soft. Bowel sounds present. Non-tender.  Extremities: No lower extremity edema. Pulses are 2 + in the bilateral DP/PT.  Echo August 2020:  1. The left ventricle  has normal systolic function with an ejection fraction of 60-65%. The cavity size was normal. There is moderately increased left ventricular wall thickness. Left ventricular diastolic Doppler parameters are consistent with impaired  relaxation. Elevated mean left atrial pressure.  2. The right ventricle has normal systolic function. The cavity was normal. There is no increase in right ventricular wall thickness.  3. Left atrial size was mildly dilated.  4. No evidence of mitral valve stenosis.  5. The aortic valve has an indeterminate number of cusps. Severely thickening of the aortic valve. Severe calcifcation of the aortic valve. Aortic valve regurgitation is moderate by color flow Doppler. Mild stenosis of the aortic valve. Severe aortic  annular calcification noted.  6. The aorta is normal in size and structure.  7. The aortic root is normal in size and structure.  8. Pulmonary hypertension is indeterminate, inadequate TR jet.  EKG:  EKG is not ordered today. The ekg ordered today demonstrates   Recent Labs: 01/17/2019: ALT 18; BUN 24; Creatinine, Ser 1.17; Potassium 5.4; Sodium 134 05/14/2019: Hemoglobin 10.3; Platelet Count 210   Lipid Panel    Component Value Date/Time   CHOL 97 (L) 01/17/2019 1500   TRIG 86 01/17/2019 1500   HDL 35 (L) 01/17/2019 1500   CHOLHDL 2.8 01/17/2019 1500   CHOLHDL 3.6 03/03/2015 0001   VLDL 27 03/03/2015 0001   LDLCALC 45 01/17/2019 1500     Wt Readings from Last 3 Encounters:  05/16/19 192 lb 9.6 oz (87.4 kg)  05/07/19 192 lb 14.4 oz (87.5 kg)  03/13/19 189 lb 6.4 oz (85.9 kg)     Other studies Reviewed: Additional studies/ records that were reviewed today include: . Review of  the above records demonstrates:    Assessment and Plan:   1. CAD with chronic stable angina: He has no chest pain. Anemia improved. His RCA is known to be occluded and not amenable to PCI. There is moderate disease in the LAD and Circumflex by cath in 2013. LV  function normal by echo in August 2020. Will continue Plavix, ASA, statin, Imdur and beta blocker.   2. HTN: BP is well controlled.   3. Carotid artery disease: Moderate bilateral ICA stenosis, stable by dopplers July 2018. He does not wish to repeat.   4. Hyperlipidemia: Lipids well controlled in December 2020. Will continue statin.     5. Aortic valve disease: Mild AS and moderate by echo August 2020.   Current medicines are reviewed at length with the patient today.  The patient does not have concerns regarding medicines.  The following changes have been made:  no change  Labs/ tests ordered today include:   No orders of the defined types were placed in this encounter.   Disposition:   FU with me in 12 months  Signed, Lauree Chandler, MD 05/16/2019 9:29 AM    [facility] [address], [place], [place]  [address] Phone: [phone]; Fax: [phone]

## 2019-05-16 ENCOUNTER — Encounter: Payer: Self-pay | Admitting: Cardiovascular Disease

## 2019-05-16 ENCOUNTER — Other Ambulatory Visit: Payer: Self-pay

## 2019-05-16 ENCOUNTER — Ambulatory Visit: Payer: Medicare Other | Admitting: Cardiovascular Disease

## 2019-05-16 VITALS — BP 134/60 | HR 61 | Ht 70.0 in | Wt 192.6 lb

## 2019-05-16 DIAGNOSIS — I10 Essential (primary) hypertension: Secondary | ICD-10-CM | POA: Diagnosis not present

## 2019-05-16 DIAGNOSIS — E785 Hyperlipidemia, unspecified: Secondary | ICD-10-CM | POA: Diagnosis not present

## 2019-05-16 DIAGNOSIS — I351 Nonrheumatic aortic (valve) insufficiency: Secondary | ICD-10-CM | POA: Diagnosis not present

## 2019-05-16 DIAGNOSIS — I25118 Atherosclerotic heart disease of native coronary artery with other forms of angina pectoris: Secondary | ICD-10-CM | POA: Diagnosis not present

## 2019-05-16 DIAGNOSIS — I6523 Occlusion and stenosis of bilateral carotid arteries: Secondary | ICD-10-CM | POA: Diagnosis not present

## 2019-05-16 NOTE — Patient Instructions (Signed)
Medication Instructions:  No changes *If you need a refill on your cardiac medications before your next appointment, please call your pharmacy*   Lab Work: None today.  Testing/Procedures: none  Follow-Up: At [facility], you and your health needs are our priority.  As part of our continuing mission to provide you with exceptional heart care, we have created designated Provider Care Teams.  These Care Teams include your primary Cardiologist (physician) and Advanced Practice Providers (APPs -  Physician Assistants and Nurse Practitioners) who all work together to provide you with the care you need, when you need it.  Your next appointment:   6 month(s)  The format for your next appointment:   In Person  Provider:   Lauree Chandler, MD   Other Instructions

## 2019-05-21 ENCOUNTER — Inpatient Hospital Stay: Payer: Medicare Other

## 2019-05-21 ENCOUNTER — Other Ambulatory Visit: Payer: Self-pay

## 2019-05-21 VITALS — BP 138/62 | HR 64 | Temp 98.5°F | Resp 20

## 2019-05-21 DIAGNOSIS — I129 Hypertensive chronic kidney disease with stage 1 through stage 4 chronic kidney disease, or unspecified chronic kidney disease: Secondary | ICD-10-CM | POA: Diagnosis not present

## 2019-05-21 DIAGNOSIS — E1122 Type 2 diabetes mellitus with diabetic chronic kidney disease: Secondary | ICD-10-CM | POA: Diagnosis not present

## 2019-05-21 DIAGNOSIS — Z955 Presence of coronary angioplasty implant and graft: Secondary | ICD-10-CM | POA: Diagnosis not present

## 2019-05-21 DIAGNOSIS — I714 Abdominal aortic aneurysm, without rupture: Secondary | ICD-10-CM | POA: Diagnosis not present

## 2019-05-21 DIAGNOSIS — E785 Hyperlipidemia, unspecified: Secondary | ICD-10-CM | POA: Diagnosis not present

## 2019-05-21 DIAGNOSIS — C946 Myelodysplastic disease, not classified: Secondary | ICD-10-CM

## 2019-05-21 DIAGNOSIS — J439 Emphysema, unspecified: Secondary | ICD-10-CM | POA: Diagnosis not present

## 2019-05-21 DIAGNOSIS — E559 Vitamin D deficiency, unspecified: Secondary | ICD-10-CM | POA: Diagnosis not present

## 2019-05-21 DIAGNOSIS — E538 Deficiency of other specified B group vitamins: Secondary | ICD-10-CM | POA: Diagnosis not present

## 2019-05-21 DIAGNOSIS — I2510 Atherosclerotic heart disease of native coronary artery without angina pectoris: Secondary | ICD-10-CM | POA: Diagnosis not present

## 2019-05-21 DIAGNOSIS — D638 Anemia in other chronic diseases classified elsewhere: Secondary | ICD-10-CM

## 2019-05-21 DIAGNOSIS — D631 Anemia in chronic kidney disease: Secondary | ICD-10-CM | POA: Diagnosis not present

## 2019-05-21 DIAGNOSIS — K219 Gastro-esophageal reflux disease without esophagitis: Secondary | ICD-10-CM | POA: Diagnosis not present

## 2019-05-21 DIAGNOSIS — D469 Myelodysplastic syndrome, unspecified: Secondary | ICD-10-CM

## 2019-05-21 DIAGNOSIS — Z79899 Other long term (current) drug therapy: Secondary | ICD-10-CM | POA: Diagnosis not present

## 2019-05-21 DIAGNOSIS — Z86718 Personal history of other venous thrombosis and embolism: Secondary | ICD-10-CM | POA: Diagnosis not present

## 2019-05-21 DIAGNOSIS — N183 Chronic kidney disease, stage 3 unspecified: Secondary | ICD-10-CM | POA: Diagnosis not present

## 2019-05-21 DIAGNOSIS — K7460 Unspecified cirrhosis of liver: Secondary | ICD-10-CM | POA: Diagnosis not present

## 2019-05-21 LAB — CBC WITH DIFFERENTIAL (CANCER CENTER ONLY)
Abs Immature Granulocytes: 0.29 10*3/uL — ABNORMAL HIGH (ref 0.00–0.07)
Basophils Absolute: 0.3 10*3/uL — ABNORMAL HIGH (ref 0.0–0.1)
Basophils Relative: 2 %
Eosinophils Absolute: 0.5 10*3/uL (ref 0.0–0.5)
Eosinophils Relative: 3 %
HCT: 31.6 % — ABNORMAL LOW (ref 39.0–52.0)
Hemoglobin: 10.4 g/dL — ABNORMAL LOW (ref 13.0–17.0)
Immature Granulocytes: 2 %
Lymphocytes Relative: 25 %
Lymphs Abs: 3.8 10*3/uL (ref 0.7–4.0)
MCH: 27.4 pg (ref 26.0–34.0)
MCHC: 32.9 g/dL (ref 30.0–36.0)
MCV: 83.4 fL (ref 80.0–100.0)
Monocytes Absolute: 0.4 10*3/uL (ref 0.1–1.0)
Monocytes Relative: 3 %
Neutro Abs: 9.9 10*3/uL — ABNORMAL HIGH (ref 1.7–7.7)
Neutrophils Relative %: 65 %
Platelet Count: 185 10*3/uL (ref 150–400)
RBC: 3.79 MIL/uL — ABNORMAL LOW (ref 4.22–5.81)
RDW: 19.1 % — ABNORMAL HIGH (ref 11.5–15.5)
WBC Count: 15.2 10*3/uL — ABNORMAL HIGH (ref 4.0–10.5)
nRBC: 0 % (ref 0.0–0.2)

## 2019-05-21 MED ORDER — EPOETIN ALFA-EPBX 10000 UNIT/ML IJ SOLN
20000.0000 [IU] | Freq: Once | INTRAMUSCULAR | Status: AC
  Administered 2019-05-21: 20000 [IU] via SUBCUTANEOUS

## 2019-05-21 MED ORDER — EPOETIN ALFA-EPBX 10000 UNIT/ML IJ SOLN
INTRAMUSCULAR | Status: AC
  Filled 2019-05-21: qty 2

## 2019-05-21 NOTE — Patient Instructions (Signed)

## 2019-05-24 ENCOUNTER — Other Ambulatory Visit: Payer: Self-pay

## 2019-05-24 ENCOUNTER — Ambulatory Visit: Payer: Medicare Other | Admitting: Podiatry

## 2019-05-24 ENCOUNTER — Telehealth: Payer: Self-pay | Admitting: *Deleted

## 2019-05-24 VITALS — Temp 97.6°F

## 2019-05-24 DIAGNOSIS — M19079 Primary osteoarthritis, unspecified ankle and foot: Secondary | ICD-10-CM

## 2019-05-24 DIAGNOSIS — L97529 Non-pressure chronic ulcer of other part of left foot with unspecified severity: Secondary | ICD-10-CM

## 2019-05-24 DIAGNOSIS — E11621 Type 2 diabetes mellitus with foot ulcer: Secondary | ICD-10-CM

## 2019-05-24 DIAGNOSIS — I739 Peripheral vascular disease, unspecified: Secondary | ICD-10-CM

## 2019-05-24 NOTE — Telephone Encounter (Signed)
-----   Message from Evelina Bucy, DPM sent at 05/24/2019  2:43 PM EDT ----- Can we order arterial studies

## 2019-05-24 NOTE — Progress Notes (Addendum)
  Subjective:  Patient ID: Paul [name], male    DOB: [birth year],  MRN: [id number]  Chief Complaint  Patient presents with  . Wound Check    Left 1st digit wound check. Pt states no concerns and denies fever/chills/nausea/vomiting. Pt states he is unsure of improvement "I cannot see the wound"   79 y.o. male presents for wound care. Hx confirmed with patient.  Objective:  Physical Exam: Wound Location: left hallux Wound Measurement: 0.3x0.4 Wound Base: Granular/Healthy Peri-wound: Calloused Exudate: Scant/small amount Serosanguinous exudate wound without warmth, erythema, signs of acute infection  Assessment:   1. Diabetic ulcer of left great toe ([place])   2. Arthritis of big toe   3. PAD (peripheral artery disease) ([place])      Plan:  Patient was evaluated and treated and all questions answered.  Ulcer Left hallux; Hallux IPJ Arthritis -Ulcer debrided as below -Again discussed possible IPJ arthroplasty. Will check circulatory studies. -Last A1c adequate. -Dispense tubefoam bunion shield.  Procedure: Selective Debridement of Wound Rationale: Removal of devitalized tissue from the wound to promote healing.  Pre-Debridement Wound Measurements: 0.3 cm x 0.4 cm x 0.1 cm  Post-Debridement Wound Measurements: same as pre-debridement. Type of Debridement: sharp selective Tissue Removed: Devitalized soft-tissue Dressing: Dry, sterile, compression dressing. Disposition: Patient tolerated procedure well. Patient to return in 1 week for follow-up.       Return in about 1 month (around 06/23/2019).

## 2019-05-24 NOTE — Telephone Encounter (Signed)
Faxed orders to [facility]. 

## 2019-05-25 ENCOUNTER — Telehealth: Payer: Self-pay | Admitting: *Deleted

## 2019-05-25 NOTE — Telephone Encounter (Signed)
-----   Message from Evelina Bucy, DPM sent at 05/24/2019  2:43 PM EDT ----- Can we order arterial studies

## 2019-05-28 ENCOUNTER — Other Ambulatory Visit: Payer: Self-pay

## 2019-05-28 ENCOUNTER — Inpatient Hospital Stay: Payer: Medicare Other

## 2019-05-28 VITALS — BP 154/53 | HR 76 | Temp 98.6°F | Resp 18

## 2019-05-28 DIAGNOSIS — D638 Anemia in other chronic diseases classified elsewhere: Secondary | ICD-10-CM

## 2019-05-28 DIAGNOSIS — C946 Myelodysplastic disease, not classified: Secondary | ICD-10-CM

## 2019-05-28 DIAGNOSIS — J439 Emphysema, unspecified: Secondary | ICD-10-CM | POA: Diagnosis not present

## 2019-05-28 DIAGNOSIS — D469 Myelodysplastic syndrome, unspecified: Secondary | ICD-10-CM

## 2019-05-28 DIAGNOSIS — Z955 Presence of coronary angioplasty implant and graft: Secondary | ICD-10-CM | POA: Diagnosis not present

## 2019-05-28 DIAGNOSIS — D631 Anemia in chronic kidney disease: Secondary | ICD-10-CM | POA: Diagnosis not present

## 2019-05-28 DIAGNOSIS — K7460 Unspecified cirrhosis of liver: Secondary | ICD-10-CM | POA: Diagnosis not present

## 2019-05-28 DIAGNOSIS — N183 Chronic kidney disease, stage 3 unspecified: Secondary | ICD-10-CM | POA: Diagnosis not present

## 2019-05-28 DIAGNOSIS — E559 Vitamin D deficiency, unspecified: Secondary | ICD-10-CM | POA: Diagnosis not present

## 2019-05-28 DIAGNOSIS — I2510 Atherosclerotic heart disease of native coronary artery without angina pectoris: Secondary | ICD-10-CM | POA: Diagnosis not present

## 2019-05-28 DIAGNOSIS — K219 Gastro-esophageal reflux disease without esophagitis: Secondary | ICD-10-CM | POA: Diagnosis not present

## 2019-05-28 DIAGNOSIS — Z79899 Other long term (current) drug therapy: Secondary | ICD-10-CM | POA: Diagnosis not present

## 2019-05-28 DIAGNOSIS — I714 Abdominal aortic aneurysm, without rupture: Secondary | ICD-10-CM | POA: Diagnosis not present

## 2019-05-28 DIAGNOSIS — Z86718 Personal history of other venous thrombosis and embolism: Secondary | ICD-10-CM | POA: Diagnosis not present

## 2019-05-28 DIAGNOSIS — I129 Hypertensive chronic kidney disease with stage 1 through stage 4 chronic kidney disease, or unspecified chronic kidney disease: Secondary | ICD-10-CM | POA: Diagnosis not present

## 2019-05-28 DIAGNOSIS — E538 Deficiency of other specified B group vitamins: Secondary | ICD-10-CM | POA: Diagnosis not present

## 2019-05-28 DIAGNOSIS — E1122 Type 2 diabetes mellitus with diabetic chronic kidney disease: Secondary | ICD-10-CM | POA: Diagnosis not present

## 2019-05-28 DIAGNOSIS — E785 Hyperlipidemia, unspecified: Secondary | ICD-10-CM | POA: Diagnosis not present

## 2019-05-28 LAB — CBC WITH DIFFERENTIAL (CANCER CENTER ONLY)
Abs Immature Granulocytes: 0.24 10*3/uL — ABNORMAL HIGH (ref 0.00–0.07)
Basophils Absolute: 0.3 10*3/uL — ABNORMAL HIGH (ref 0.0–0.1)
Basophils Relative: 2 %
Eosinophils Absolute: 0.5 10*3/uL (ref 0.0–0.5)
Eosinophils Relative: 3 %
HCT: 31 % — ABNORMAL LOW (ref 39.0–52.0)
Hemoglobin: 10.3 g/dL — ABNORMAL LOW (ref 13.0–17.0)
Immature Granulocytes: 2 %
Lymphocytes Relative: 26 %
Lymphs Abs: 4 10*3/uL (ref 0.7–4.0)
MCH: 27.4 pg (ref 26.0–34.0)
MCHC: 33.2 g/dL (ref 30.0–36.0)
MCV: 82.4 fL (ref 80.0–100.0)
Monocytes Absolute: 0.4 10*3/uL (ref 0.1–1.0)
Monocytes Relative: 3 %
Neutro Abs: 10.3 10*3/uL — ABNORMAL HIGH (ref 1.7–7.7)
Neutrophils Relative %: 64 %
Platelet Count: 192 10*3/uL (ref 150–400)
RBC: 3.76 MIL/uL — ABNORMAL LOW (ref 4.22–5.81)
RDW: 18.2 % — ABNORMAL HIGH (ref 11.5–15.5)
WBC Count: 15.7 10*3/uL — ABNORMAL HIGH (ref 4.0–10.5)
nRBC: 0 % (ref 0.0–0.2)

## 2019-05-28 MED ORDER — EPOETIN ALFA-EPBX 10000 UNIT/ML IJ SOLN
20000.0000 [IU] | Freq: Once | INTRAMUSCULAR | Status: AC
  Administered 2019-05-28: 20000 [IU] via SUBCUTANEOUS

## 2019-05-28 MED ORDER — EPOETIN ALFA-EPBX 10000 UNIT/ML IJ SOLN
INTRAMUSCULAR | Status: AC
  Filled 2019-05-28: qty 2

## 2019-05-28 NOTE — Patient Instructions (Signed)

## 2019-05-29 DIAGNOSIS — M25562 Pain in left knee: Secondary | ICD-10-CM | POA: Diagnosis not present

## 2019-05-29 DIAGNOSIS — M0579 Rheumatoid arthritis with rheumatoid factor of multiple sites without organ or systems involvement: Secondary | ICD-10-CM | POA: Diagnosis not present

## 2019-05-29 DIAGNOSIS — M109 Gout, unspecified: Secondary | ICD-10-CM | POA: Diagnosis not present

## 2019-05-29 DIAGNOSIS — Z7189 Other specified counseling: Secondary | ICD-10-CM | POA: Diagnosis not present

## 2019-05-29 DIAGNOSIS — I82A11 Acute embolism and thrombosis of right axillary vein: Secondary | ICD-10-CM | POA: Diagnosis not present

## 2019-06-04 ENCOUNTER — Other Ambulatory Visit: Payer: Self-pay

## 2019-06-04 ENCOUNTER — Inpatient Hospital Stay: Payer: Medicare Other | Attending: Hematology

## 2019-06-04 ENCOUNTER — Inpatient Hospital Stay: Payer: Medicare Other

## 2019-06-04 VITALS — BP 164/50 | HR 66 | Temp 98.9°F | Resp 18

## 2019-06-04 DIAGNOSIS — I129 Hypertensive chronic kidney disease with stage 1 through stage 4 chronic kidney disease, or unspecified chronic kidney disease: Secondary | ICD-10-CM | POA: Insufficient documentation

## 2019-06-04 DIAGNOSIS — N189 Chronic kidney disease, unspecified: Secondary | ICD-10-CM | POA: Insufficient documentation

## 2019-06-04 DIAGNOSIS — D638 Anemia in other chronic diseases classified elsewhere: Secondary | ICD-10-CM

## 2019-06-04 DIAGNOSIS — D631 Anemia in chronic kidney disease: Secondary | ICD-10-CM | POA: Insufficient documentation

## 2019-06-04 DIAGNOSIS — D469 Myelodysplastic syndrome, unspecified: Secondary | ICD-10-CM

## 2019-06-04 DIAGNOSIS — Z79899 Other long term (current) drug therapy: Secondary | ICD-10-CM | POA: Diagnosis not present

## 2019-06-04 LAB — CBC WITH DIFFERENTIAL (CANCER CENTER ONLY)
Abs Immature Granulocytes: 0.45 10*3/uL — ABNORMAL HIGH (ref 0.00–0.07)
Basophils Absolute: 0.2 10*3/uL — ABNORMAL HIGH (ref 0.0–0.1)
Basophils Relative: 1 %
Eosinophils Absolute: 0.4 10*3/uL (ref 0.0–0.5)
Eosinophils Relative: 2 %
HCT: 32.3 % — ABNORMAL LOW (ref 39.0–52.0)
Hemoglobin: 10.6 g/dL — ABNORMAL LOW (ref 13.0–17.0)
Immature Granulocytes: 3 %
Lymphocytes Relative: 25 %
Lymphs Abs: 4.1 10*3/uL — ABNORMAL HIGH (ref 0.7–4.0)
MCH: 27 pg (ref 26.0–34.0)
MCHC: 32.8 g/dL (ref 30.0–36.0)
MCV: 82.4 fL (ref 80.0–100.0)
Monocytes Absolute: 0.3 10*3/uL (ref 0.1–1.0)
Monocytes Relative: 2 %
Neutro Abs: 11.3 10*3/uL — ABNORMAL HIGH (ref 1.7–7.7)
Neutrophils Relative %: 67 %
Platelet Count: 210 10*3/uL (ref 150–400)
RBC: 3.92 MIL/uL — ABNORMAL LOW (ref 4.22–5.81)
RDW: 18.2 % — ABNORMAL HIGH (ref 11.5–15.5)
WBC Count: 16.8 10*3/uL — ABNORMAL HIGH (ref 4.0–10.5)
nRBC: 0 % (ref 0.0–0.2)

## 2019-06-04 MED ORDER — EPOETIN ALFA-EPBX 10000 UNIT/ML IJ SOLN
INTRAMUSCULAR | Status: AC
  Filled 2019-06-04: qty 2

## 2019-06-04 MED ORDER — EPOETIN ALFA-EPBX 10000 UNIT/ML IJ SOLN
20000.0000 [IU] | Freq: Once | INTRAMUSCULAR | Status: AC
  Administered 2019-06-04: 20000 [IU] via SUBCUTANEOUS

## 2019-06-04 NOTE — Patient Instructions (Signed)

## 2019-06-08 ENCOUNTER — Ambulatory Visit (HOSPITAL_COMMUNITY)
Admission: RE | Admit: 2019-06-08 | Discharge: 2019-06-08 | Disposition: A | Discharge status: Home / Self Care | Payer: Medicare Other | Source: Ambulatory Visit | Attending: Cardiology | Admitting: Cardiology

## 2019-06-08 ENCOUNTER — Other Ambulatory Visit: Payer: Self-pay

## 2019-06-08 DIAGNOSIS — E11621 Type 2 diabetes mellitus with foot ulcer: Secondary | ICD-10-CM | POA: Diagnosis not present

## 2019-06-08 DIAGNOSIS — I739 Peripheral vascular disease, unspecified: Secondary | ICD-10-CM | POA: Insufficient documentation

## 2019-06-08 DIAGNOSIS — L97529 Non-pressure chronic ulcer of other part of left foot with unspecified severity: Secondary | ICD-10-CM | POA: Diagnosis not present

## 2019-06-11 ENCOUNTER — Inpatient Hospital Stay: Payer: Medicare Other

## 2019-06-11 ENCOUNTER — Other Ambulatory Visit: Payer: Self-pay

## 2019-06-11 VITALS — BP 166/58 | HR 56 | Temp 98.0°F | Resp 16

## 2019-06-11 DIAGNOSIS — Z79899 Other long term (current) drug therapy: Secondary | ICD-10-CM | POA: Diagnosis not present

## 2019-06-11 DIAGNOSIS — D469 Myelodysplastic syndrome, unspecified: Secondary | ICD-10-CM

## 2019-06-11 DIAGNOSIS — D631 Anemia in chronic kidney disease: Secondary | ICD-10-CM | POA: Diagnosis not present

## 2019-06-11 DIAGNOSIS — N189 Chronic kidney disease, unspecified: Secondary | ICD-10-CM | POA: Diagnosis not present

## 2019-06-11 DIAGNOSIS — D638 Anemia in other chronic diseases classified elsewhere: Secondary | ICD-10-CM

## 2019-06-11 DIAGNOSIS — I129 Hypertensive chronic kidney disease with stage 1 through stage 4 chronic kidney disease, or unspecified chronic kidney disease: Secondary | ICD-10-CM | POA: Diagnosis not present

## 2019-06-11 LAB — CBC WITH DIFFERENTIAL (CANCER CENTER ONLY)
Abs Immature Granulocytes: 0.56 10*3/uL — ABNORMAL HIGH (ref 0.00–0.07)
Basophils Absolute: 0.2 10*3/uL — ABNORMAL HIGH (ref 0.0–0.1)
Basophils Relative: 1 %
Eosinophils Absolute: 0.3 10*3/uL (ref 0.0–0.5)
Eosinophils Relative: 1 %
HCT: 31.8 % — ABNORMAL LOW (ref 39.0–52.0)
Hemoglobin: 10.4 g/dL — ABNORMAL LOW (ref 13.0–17.0)
Immature Granulocytes: 3 %
Lymphocytes Relative: 23 %
Lymphs Abs: 4.3 10*3/uL — ABNORMAL HIGH (ref 0.7–4.0)
MCH: 26.3 pg (ref 26.0–34.0)
MCHC: 32.7 g/dL (ref 30.0–36.0)
MCV: 80.5 fL (ref 80.0–100.0)
Monocytes Absolute: 0.4 10*3/uL (ref 0.1–1.0)
Monocytes Relative: 2 %
Neutro Abs: 12.7 10*3/uL — ABNORMAL HIGH (ref 1.7–7.7)
Neutrophils Relative %: 70 %
Platelet Count: 194 10*3/uL (ref 150–400)
RBC: 3.95 MIL/uL — ABNORMAL LOW (ref 4.22–5.81)
RDW: 18.2 % — ABNORMAL HIGH (ref 11.5–15.5)
WBC Count: 18.4 10*3/uL — ABNORMAL HIGH (ref 4.0–10.5)
nRBC: 0 % (ref 0.0–0.2)

## 2019-06-11 MED ORDER — EPOETIN ALFA-EPBX 10000 UNIT/ML IJ SOLN
20000.0000 [IU] | Freq: Once | INTRAMUSCULAR | Status: AC
  Administered 2019-06-11: 20000 [IU] via SUBCUTANEOUS

## 2019-06-11 MED ORDER — EPOETIN ALFA-EPBX 10000 UNIT/ML IJ SOLN
INTRAMUSCULAR | Status: AC
  Filled 2019-06-11: qty 2

## 2019-06-12 ENCOUNTER — Ambulatory Visit (INDEPENDENT_AMBULATORY_CARE_PROVIDER_SITE_OTHER): Payer: Medicare Other | Admitting: Family Medicine

## 2019-06-12 ENCOUNTER — Encounter: Payer: Self-pay | Admitting: Family Medicine

## 2019-06-12 VITALS — BP 154/76 | HR 49 | Temp 97.5°F | Ht 69.0 in | Wt 189.4 lb

## 2019-06-12 DIAGNOSIS — I10 Essential (primary) hypertension: Secondary | ICD-10-CM

## 2019-06-12 DIAGNOSIS — E785 Hyperlipidemia, unspecified: Secondary | ICD-10-CM

## 2019-06-12 DIAGNOSIS — E1140 Type 2 diabetes mellitus with diabetic neuropathy, unspecified: Secondary | ICD-10-CM | POA: Diagnosis not present

## 2019-06-12 DIAGNOSIS — I2510 Atherosclerotic heart disease of native coronary artery without angina pectoris: Secondary | ICD-10-CM

## 2019-06-12 DIAGNOSIS — I152 Hypertension secondary to endocrine disorders: Secondary | ICD-10-CM

## 2019-06-12 DIAGNOSIS — E1159 Type 2 diabetes mellitus with other circulatory complications: Secondary | ICD-10-CM

## 2019-06-12 DIAGNOSIS — E538 Deficiency of other specified B group vitamins: Secondary | ICD-10-CM | POA: Diagnosis not present

## 2019-06-12 DIAGNOSIS — D469 Myelodysplastic syndrome, unspecified: Secondary | ICD-10-CM | POA: Diagnosis not present

## 2019-06-12 DIAGNOSIS — M1A0490 Idiopathic chronic gout, unspecified hand, without tophus (tophi): Secondary | ICD-10-CM

## 2019-06-12 DIAGNOSIS — E1169 Type 2 diabetes mellitus with other specified complication: Secondary | ICD-10-CM

## 2019-06-12 DIAGNOSIS — Z0000 Encounter for general adult medical examination without abnormal findings: Secondary | ICD-10-CM | POA: Diagnosis not present

## 2019-06-12 DIAGNOSIS — I714 Abdominal aortic aneurysm, without rupture, unspecified: Secondary | ICD-10-CM

## 2019-06-12 DIAGNOSIS — D638 Anemia in other chronic diseases classified elsewhere: Secondary | ICD-10-CM

## 2019-06-12 DIAGNOSIS — F17200 Nicotine dependence, unspecified, uncomplicated: Secondary | ICD-10-CM

## 2019-06-12 DIAGNOSIS — Z9849 Cataract extraction status, unspecified eye: Secondary | ICD-10-CM

## 2019-06-12 LAB — POCT GLYCOSYLATED HEMOGLOBIN (HGB A1C): Hemoglobin A1C: 5.2 % (ref 4.0–5.6)

## 2019-06-12 NOTE — Progress Notes (Signed)
Paul [name] is a 79 y.o. male who presents for annual wellness visit ,CPE and follow-up on chronic medical conditions.  He continues to be followed by oncology as well as cardiology.  He also follows up regularly with rheumatology.  He is not a very good historian and did not bring his medications in.  It was difficult to find out exactly what medications he is taking.  He does get weekly infusions.  He recently had an evaluation for PAD and the Doppler study was negative.  There is question as to whether he is still taking Arava.  He does continue to smoke 2 or 3 cigarettes/day and is not wanting to give it up.  He does have underlying gout and is on Uloric for this.  He continues on Norvasc, Lopressor and does have Imdur.  He does have nitroglycerin that he has for emergencies.  He is also taking Plavix as well as aspirin.  He is having no difficulty from simvastatin.  He does have a history of diabetes but presently is on no medications.  He has been seeing podiatry for treatment of diabetes related ulcer.  This seems to be healing.  Review of the record indicates he does need follow-up on AAA.  He has also had his cataract removed and seems to be doing well with no need for glasses.  He does have a previous history of vitamin B-12 deficiency.   Immunizations and Health Maintenance Immunization History  Administered Date(s) Administered  . Fluad Quad(high Dose 65+) 10/23/2018  . Influenza, High Dose Seasonal PF 10/31/2014, 11/17/2015  . Influenza,inj,Quad PF,6+ Mos 01/02/2018  . Influenza-Unspecified 11/30/2013, 12/02/2016  . PFIZER SARS-COV-2 Vaccination 02/26/2019, 03/19/2019  . PPD Test 04/22/2015  . Pneumococcal Conjugate-13 09/27/2013  . Pneumococcal Polysaccharide-23 09/06/2011  . Tdap 08/24/2010  . Zoster 02/02/2012   Health Maintenance Due  Topic Date Due  . FOOT EXAM  01/21/2018  . URINE MICROALBUMIN  01/21/2018    Last colonoscopy: 10-02-2009 Last PSA: unknown Dentist: N/A   Ophtho: Once a year Exercise: staying active  Other doctors caring for patient include: Dr. March Rummage podiatry, Dr. Angelena Form cardio, Dr. Burr Medico oncology, Dr. Park Pope.  Advanced Directives: ON FILE    Depression screen:  See questionnaire below.     Depression screen [facility] 2/9 06/12/2019 01/02/2018 11/21/2017 10/11/2017 09/27/2017  Decreased Interest 0 0 0 0 0  Down, Depressed, Hopeless 0 0 0 0 0  PHQ - 2 Score 0 0 0 0 0  Altered sleeping - 0 - - -  Tired, decreased energy - 1 - - -  Change in appetite - 0 - - -  Feeling bad or failure about yourself  - 0 - - -  Trouble concentrating - 0 - - -  Moving slowly or fidgety/restless - 0 - - -  Suicidal thoughts - 0 - - -  PHQ-9 Score - 1 - - -  Difficult doing work/chores - Not difficult at all - - -  Some recent data might be hidden    Fall Screen: See Questionaire below.   Fall Risk  06/12/2019 01/02/2018 11/21/2017 10/11/2017 09/27/2017  Falls in the past year? 0 0 No No No  Number falls in past yr: - - - - -  Injury with Fall? - - - - -  Risk for fall due to : - - - - -  Risk for fall due to: Comment - - - - -  Follow up - - - - -  ADL screen:  See questionnaire below.  Functional Status Survey: Is the patient deaf or have difficulty hearing?: No Does the patient have difficulty seeing, even when wearing glasses/contacts?: No Does the patient have difficulty concentrating, remembering, or making decisions?: No Does the patient have difficulty walking or climbing stairs?: No Does the patient have difficulty dressing or bathing?: No Does the patient have difficulty doing errands alone such as visiting a doctor's office or shopping?: No   Review of Systems  Constitutional: -, -unexpected weight change, -anorexia, -fatigue Allergy: -sneezing, -itching, -congestion Dermatology: denies changing moles, rash, lumps ENT: -runny nose, -ear pain, -sore throat,  Cardiology:  -chest pain, -palpitations, -orthopnea, Respiratory: -cough,  -shortness of breath, -dyspnea on exertion, -wheezing,  Gastroenterology: -abdominal pain, -nausea, -vomiting, -diarrhea, -constipation, -dysphagia Hematology: -bleeding or bruising problems Musculoskeletal: -arthralgias, -myalgias, -joint swelling, -back pain, - Ophthalmology: -vision changes,  Urology: -dysuria, -difficulty urinating,  -urinary frequency, -urgency, incontinence Neurology: -, -numbness, , -memory loss, -falls, -dizziness    PHYSICAL EXAM:  BP (!) 154/76   Pulse (!) 49   Temp (!) 97.5 F (36.4 C)   Ht 5\' 9"  (1.753 m)   Wt 189 lb 6.4 oz (85.9 kg)   SpO2 97%   BMI 27.97 kg/m   General Appearance: Alert, cooperative, no distress, appears stated age Head: Normocephalic, without obvious abnormality, atraumatic Eyes: PERRL, conjunctiva/corneas clear, EOM's intact, Ears: Normal TM's and external ear canals Nose: Nares normal, mucosa normal, no drainage or sinus   tenderness Throat: Lips, mucosa, and tongue normal; teeth and gums normal Neck: Supple, no lymphadenopathy, thyroid:no enlargement/tenderness/nodules; no carotid bruit or JVD Lungs: Clear to auscultation bilaterally without wheezes, rales or ronchi; respirations unlabored Heart: Regular rate and rhythm, S1 and S2 normal, no murmur, rub or gallop Abdomen: Soft, non-tender, nondistended, normoactive bowel sounds, no masses, no hepatosplenomegaly  Skin: Skin color, texture, turgor normal, no rashes or lesions Lymph nodes: Cervical, supraclavicular, and axillary nodes normal Neurologic: CNII-XII intact, normal strength, sensation and gait; reflexes 2+ and symmetric throughout   Psych: Normal mood, affect, hygiene and grooming Hemoglobin A1c is 5.2.  ASSESSMENT/PLAN: Routine general medical examination at a health care facility  Vitamin B12 deficiency - Plan: Vitamin B12  Type 2 diabetes mellitus with diabetic neuropathy, without long-term current use of insulin ([place]) - Plan: Comprehensive metabolic  panel  MDS/MPN (myelodysplastic/myeloproliferative neoplasms) ([place])  Hypertension complicating diabetes ([place]) - Plan: Comprehensive metabolic panel  Hyperlipidemia associated with type 2 diabetes mellitus ([place]) - Plan: Lipid panel  Current smoker  Anemia of chronic disease  Atherosclerosis of native coronary artery of native heart without angina pectoris  Chronic gout of hand, unspecified cause, unspecified laterality - Plan: Uric Acid  Status post cataract extraction, unspecified laterality  Abdominal aortic aneurysm (AAA) without rupture ([place]) - Plan: US AORTA He will continue with his mini visits to the doctor's offices for various issues revolving around his heart, rheumatologic, oncologic issues.    Medicare Attestation I have personally reviewed: The patient's medical and social history Their use of alcohol, tobacco or illicit drugs Their current medications and supplements The patient's functional ability including ADLs,fall risks, home safety risks, cognitive, and hearing and visual impairment Diet and physical activities Evidence for depression or mood disorders  The patient's weight, height, and BMI have been recorded in the chart.  I have made referrals, counseling, and provided education to the patient based on review of the above and I have provided the patient with a written personalized care plan for preventive services.  Jill Alexanders, MD   06/12/2019

## 2019-06-12 NOTE — Patient Instructions (Signed)
  Paul [name] , Thank you for taking time to come for your Medicare Wellness Visit. I appreciate your ongoing commitment to your health goals. Please review the following plan we discussed and let me know if I can assist you in the future.   These are the goals we discussed: Goals   None     This is a list of the screening recommended for you and due dates:  Health Maintenance  Topic Date Due  . COVID-19 Vaccine (1) Never done  . Complete foot exam   01/21/2018  . Urine Protein Check  01/21/2018  . Hemoglobin A1C  07/18/2019  . Flu Shot  09/02/2019  . Eye exam for diabetics  09/26/2019  . Tetanus Vaccine  09/09/2020  . Pneumonia vaccines  Completed

## 2019-06-12 NOTE — Addendum Note (Signed)
Addended by: Elyse Jarvis on: 06/12/2019 03:19 PM   Modules accepted: Orders

## 2019-06-13 LAB — COMPREHENSIVE METABOLIC PANEL
ALT: 16 IU/L (ref 0–44)
AST: 15 IU/L (ref 0–40)
Albumin/Globulin Ratio: 0.7 — ABNORMAL LOW (ref 1.2–2.2)
Albumin: 3.2 g/dL — ABNORMAL LOW (ref 3.7–4.7)
Alkaline Phosphatase: 76 IU/L (ref 39–117)
BUN/Creatinine Ratio: 22 (ref 10–24)
BUN: 26 mg/dL (ref 8–27)
Bilirubin Total: 0.6 mg/dL (ref 0.0–1.2)
CO2: 22 mmol/L (ref 20–29)
Calcium: 8.8 mg/dL (ref 8.6–10.2)
Chloride: 106 mmol/L (ref 96–106)
Creatinine, Ser: 1.2 mg/dL (ref 0.76–1.27)
GFR calc Af Amer: 67 mL/min/{1.73_m2} (ref >59)
GFR calc non Af Amer: 58 mL/min/{1.73_m2} — ABNORMAL LOW (ref >59)
Globulin, Total: 4.8 g/dL — ABNORMAL HIGH (ref 1.5–4.5)
Glucose: 80 mg/dL (ref 65–99)
Potassium: 4.7 mmol/L (ref 3.5–5.2)
Sodium: 135 mmol/L (ref 134–144)
Total Protein: 8 g/dL (ref 6.0–8.5)

## 2019-06-13 LAB — LIPID PANEL
Chol/HDL Ratio: 2.5 ratio (ref 0.0–5.0)
Cholesterol, Total: 101 mg/dL (ref 100–199)
HDL: 41 mg/dL (ref >39)
LDL Chol Calc (NIH): 47 mg/dL (ref 0–99)
Triglycerides: 53 mg/dL (ref 0–149)
VLDL Cholesterol Cal: 13 mg/dL (ref 5–40)

## 2019-06-13 LAB — VITAMIN B12: Vitamin B-12: >2000 pg/mL — ABNORMAL HIGH (ref 232–1245)

## 2019-06-13 LAB — URIC ACID: Uric Acid: 5.5 mg/dL (ref 3.8–8.4)

## 2019-06-14 ENCOUNTER — Ambulatory Visit
Admission: RE | Admit: 2019-06-14 | Discharge: 2019-06-14 | Disposition: A | Discharge status: Home / Self Care | Payer: Medicare Other | Source: Ambulatory Visit | Attending: Family Medicine | Admitting: Family Medicine

## 2019-06-14 DIAGNOSIS — I714 Abdominal aortic aneurysm, without rupture: Secondary | ICD-10-CM | POA: Diagnosis not present

## 2019-06-15 ENCOUNTER — Other Ambulatory Visit: Payer: Self-pay

## 2019-06-15 DIAGNOSIS — I714 Abdominal aortic aneurysm, without rupture, unspecified: Secondary | ICD-10-CM

## 2019-06-18 ENCOUNTER — Inpatient Hospital Stay: Payer: Medicare Other

## 2019-06-18 ENCOUNTER — Other Ambulatory Visit: Payer: Self-pay

## 2019-06-18 VITALS — BP 108/72 | HR 78 | Temp 98.2°F | Resp 18

## 2019-06-18 DIAGNOSIS — D469 Myelodysplastic syndrome, unspecified: Secondary | ICD-10-CM

## 2019-06-18 DIAGNOSIS — N189 Chronic kidney disease, unspecified: Secondary | ICD-10-CM | POA: Diagnosis not present

## 2019-06-18 DIAGNOSIS — D638 Anemia in other chronic diseases classified elsewhere: Secondary | ICD-10-CM

## 2019-06-18 DIAGNOSIS — Z79899 Other long term (current) drug therapy: Secondary | ICD-10-CM | POA: Diagnosis not present

## 2019-06-18 DIAGNOSIS — D631 Anemia in chronic kidney disease: Secondary | ICD-10-CM | POA: Diagnosis not present

## 2019-06-18 DIAGNOSIS — I129 Hypertensive chronic kidney disease with stage 1 through stage 4 chronic kidney disease, or unspecified chronic kidney disease: Secondary | ICD-10-CM | POA: Diagnosis not present

## 2019-06-18 LAB — CBC WITH DIFFERENTIAL (CANCER CENTER ONLY)
Abs Immature Granulocytes: 0.38 10*3/uL — ABNORMAL HIGH (ref 0.00–0.07)
Basophils Absolute: 0.2 10*3/uL — ABNORMAL HIGH (ref 0.0–0.1)
Basophils Relative: 1 %
Eosinophils Absolute: 0.3 10*3/uL (ref 0.0–0.5)
Eosinophils Relative: 1 %
HCT: 32.5 % — ABNORMAL LOW (ref 39.0–52.0)
Hemoglobin: 10.6 g/dL — ABNORMAL LOW (ref 13.0–17.0)
Immature Granulocytes: 2 %
Lymphocytes Relative: 23 %
Lymphs Abs: 4.1 10*3/uL — ABNORMAL HIGH (ref 0.7–4.0)
MCH: 26.4 pg (ref 26.0–34.0)
MCHC: 32.6 g/dL (ref 30.0–36.0)
MCV: 80.8 fL (ref 80.0–100.0)
Monocytes Absolute: 0.4 10*3/uL (ref 0.1–1.0)
Monocytes Relative: 2 %
Neutro Abs: 12.3 10*3/uL — ABNORMAL HIGH (ref 1.7–7.7)
Neutrophils Relative %: 71 %
Platelet Count: 210 10*3/uL (ref 150–400)
RBC: 4.02 MIL/uL — ABNORMAL LOW (ref 4.22–5.81)
RDW: 18.1 % — ABNORMAL HIGH (ref 11.5–15.5)
WBC Count: 17.6 10*3/uL — ABNORMAL HIGH (ref 4.0–10.5)
nRBC: 0 % (ref 0.0–0.2)

## 2019-06-18 MED ORDER — EPOETIN ALFA-EPBX 10000 UNIT/ML IJ SOLN
20000.0000 [IU] | Freq: Once | INTRAMUSCULAR | Status: AC
  Administered 2019-06-18: 20000 [IU] via SUBCUTANEOUS

## 2019-06-18 MED ORDER — EPOETIN ALFA-EPBX 10000 UNIT/ML IJ SOLN
INTRAMUSCULAR | Status: AC
  Filled 2019-06-18: qty 2

## 2019-06-18 NOTE — Patient Instructions (Signed)

## 2019-06-22 ENCOUNTER — Ambulatory Visit: Payer: Medicare Other | Admitting: Podiatry

## 2019-06-25 ENCOUNTER — Inpatient Hospital Stay: Payer: Medicare Other

## 2019-06-25 ENCOUNTER — Other Ambulatory Visit: Payer: Self-pay

## 2019-06-25 VITALS — BP 118/72 | HR 78 | Temp 98.2°F | Resp 18

## 2019-06-25 DIAGNOSIS — Z79899 Other long term (current) drug therapy: Secondary | ICD-10-CM | POA: Diagnosis not present

## 2019-06-25 DIAGNOSIS — N189 Chronic kidney disease, unspecified: Secondary | ICD-10-CM | POA: Diagnosis not present

## 2019-06-25 DIAGNOSIS — D638 Anemia in other chronic diseases classified elsewhere: Secondary | ICD-10-CM

## 2019-06-25 DIAGNOSIS — D469 Myelodysplastic syndrome, unspecified: Secondary | ICD-10-CM

## 2019-06-25 DIAGNOSIS — D631 Anemia in chronic kidney disease: Secondary | ICD-10-CM | POA: Diagnosis not present

## 2019-06-25 DIAGNOSIS — I129 Hypertensive chronic kidney disease with stage 1 through stage 4 chronic kidney disease, or unspecified chronic kidney disease: Secondary | ICD-10-CM | POA: Diagnosis not present

## 2019-06-25 LAB — CBC WITH DIFFERENTIAL (CANCER CENTER ONLY)
Abs Immature Granulocytes: 0.63 10*3/uL — ABNORMAL HIGH (ref 0.00–0.07)
Basophils Absolute: 0.2 10*3/uL — ABNORMAL HIGH (ref 0.0–0.1)
Basophils Relative: 1 %
Eosinophils Absolute: 0.1 10*3/uL (ref 0.0–0.5)
Eosinophils Relative: 1 %
HCT: 32.8 % — ABNORMAL LOW (ref 39.0–52.0)
Hemoglobin: 10.8 g/dL — ABNORMAL LOW (ref 13.0–17.0)
Immature Granulocytes: 4 %
Lymphocytes Relative: 14 %
Lymphs Abs: 2.5 10*3/uL (ref 0.7–4.0)
MCH: 26.9 pg (ref 26.0–34.0)
MCHC: 32.9 g/dL (ref 30.0–36.0)
MCV: 81.6 fL (ref 80.0–100.0)
Monocytes Absolute: 0.2 10*3/uL (ref 0.1–1.0)
Monocytes Relative: 1 %
Neutro Abs: 14 10*3/uL — ABNORMAL HIGH (ref 1.7–7.7)
Neutrophils Relative %: 79 %
Platelet Count: 197 10*3/uL (ref 150–400)
RBC: 4.02 MIL/uL — ABNORMAL LOW (ref 4.22–5.81)
RDW: 18.7 % — ABNORMAL HIGH (ref 11.5–15.5)
WBC Count: 17.6 10*3/uL — ABNORMAL HIGH (ref 4.0–10.5)
nRBC: 0 % (ref 0.0–0.2)

## 2019-06-25 MED ORDER — EPOETIN ALFA-EPBX 10000 UNIT/ML IJ SOLN
INTRAMUSCULAR | Status: AC
  Filled 2019-06-25: qty 2

## 2019-06-25 MED ORDER — EPOETIN ALFA-EPBX 10000 UNIT/ML IJ SOLN
20000.0000 [IU] | Freq: Once | INTRAMUSCULAR | Status: AC
  Administered 2019-06-25: 20000 [IU] via SUBCUTANEOUS

## 2019-06-25 NOTE — Patient Instructions (Signed)

## 2019-07-03 NOTE — Progress Notes (Signed)
[name]   [phone] [phone]   Clinic Follow up Note   Patient Care Team: Denita Lung, MD as PCP - General (Family Medicine) Burnell Blanks, MD as PCP - Cardiology (Cardiology) 07/04/2019  CHIEF COMPLAINT: F/u MDS/MPN  CURRENT THERAPY: Retacrit monthly started 10/30/18. Increase to every 2 weeks starting 01/29/19. Increased to weekly on 05/07/19. Decreased back to every 2 weeks from 07/04/19   INTERVAL HISTORY: [name] returns for f/u and inj as scheduled. He has been receiving weekly retacrit 20,000 units for 2 months, since 05/07/19. He is getting tired of coming here to often but feels better than he did 2 months ago. Appetite and energy are good. Denies cough, chest pain, dyspnea. Denies change in bowel habits or bleeding. Denies signs of thrombosis. Denies recent infection, fever or chills. He developed bilateral low back pain 2 weeks ago without obvious injury or strain. Pain is worse at night, limits sleep. Has to move to a recliner with heat to get sleep. Pain does not radiate or cause paresthesias down his legs.    MEDICAL HISTORY:  Past Medical History:  Diagnosis Date  . AAA (abdominal aortic aneurysm) ([place])   . Abnormal LFTs   . AKI (acute kidney injury) ([place])   . Anemia of chronic disease   . Arthritis    "all over" (12/04/2015)  . Atherosclerosis of native coronary artery of native heart without angina pectoris   . CAD (coronary artery disease)    with cath 09/26/08 showing severe heart disease in RCA with POBA, mild to mod diesease in circ and LAD  . Chronic back pain    back problems prior to surgery   . Constipation   . Coronary atherosclerosis of native coronary artery   . Dehydration   . Diabetes mellitus    with neuropathy  . Diarrhea   . Elevated liver enzymes   . Generalized weakness   . Gout of right hand   . Hiatal hernia   . History of gastroesophageal reflux (GERD)   . History of stomach ulcers   .  Hyperlipidemia   . Hypertension   . Metabolic acidosis   . Normochromic anemia 02/23/2016  . 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 Thrombocytopenia ([place])   . Unintentional weight loss 09/27/2017   33 lbs between 4/19 & 8/19  . Vitamin B12 deficiency   . Vitamin B12 deficiency   . Vitamin D deficiency   . Weakness     SURGICAL HISTORY: Past Surgical History:  Procedure Laterality Date  . ANKLE FRACTURE SURGERY Left    "crushed"  . BACK SURGERY  X 3   "fell 4 stories in 1977"  . CARPAL TUNNEL RELEASE Left   . CORONARY ANGIOPLASTY WITH STENT PLACEMENT  09/26/2008   [name] 06/03/2010  . FRACTURE SURGERY    . LEFT HEART CATHETERIZATION WITH CORONARY ANGIOGRAM N/A 01/21/2012   Procedure: LEFT HEART CATHETERIZATION WITH CORONARY ANGIOGRAM;  Surgeon: Burnell Blanks, MD;  Location: [facility] CATH LAB;  Service: Cardiovascular;  Laterality: N/A;  . LUMBAR [place]; ~ 1980  . POSTERIOR LUMBAR FUSION  ~ 1998   "3rd OR to reconstruct my back after fall in 1977"  . 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  ALLERGIES:  is allergic to allopurinol and metformin and related.  MEDICATIONS:  Current Outpatient  Medications  Medication Sig Dispense Refill  . amLODipine (NORVASC) 5 MG tablet Take 1 tablet (5 mg total) by mouth daily. 90 tablet 3  . aspirin 81 MG chewable tablet Chew 81 mg by mouth daily.    . clopidogrel (PLAVIX) 75 MG tablet TAKE 1 TABLET BY MOUTH  DAILY 90 tablet 3  . ENBREL SURECLICK 50 MG/ML injection Inject into the skin once a week.    . febuxostat (ULORIC) 40 MG tablet Take 40 mg by mouth daily.    . fish oil-omega-3 fatty acids 1000 MG capsule Take 1 g by mouth 2 (two) times daily.     Marland Kitchen glucose blood (ACCU-CHEK AVIVA PLUS) test strip Test once to twice daily 200 each 4  . isosorbide mononitrate (IMDUR) 30 MG 24 hr tablet Take one tablet by mouth every  morning, take two tablets by mouth every evening 270 tablet 3  . leflunomide (ARAVA) 20 MG tablet Take 20 mg by mouth daily.    . metoprolol tartrate (LOPRESSOR) 25 MG tablet Take 1 tablet (25 mg total) by mouth 2 (two) times daily. 180 tablet 3  . Multiple Vitamins-Minerals (CENTRUM) tablet Take 1 tablet by mouth daily.    . nitroGLYCERIN (NITROSTAT) 0.4 MG SL tablet Place 0.4 mg under the tongue every 5 (five) minutes as needed for chest pain. For 3 doses    . pyridoxine (B-6) 100 MG tablet Take 100 mg by mouth daily.    . silver sulfADIAZINE (SILVADENE) 1 % cream Apply pea-sized amount to wound daily. 50 g 0  . simvastatin (ZOCOR) 20 MG tablet Take 1 tablet (20 mg total) by mouth at bedtime. 90 tablet 3   No current facility-administered medications for this visit.    PHYSICAL EXAMINATION: ECOG PERFORMANCE STATUS: 0 - Asymptomatic  Vitals:   07/04/19 1430 07/04/19 1432  BP: (!) 196/58 (!) 175/48  Pulse: 60   Resp: 18   Temp: 97.8 F (36.6 C)   SpO2: 99%    Filed Weights   07/04/19 1430  Weight: 195 lb 9.6 oz (88.7 kg)    GENERAL:alert, no distress and comfortable SKIN: no rash  EYES: sclera clear LUNGS: clear with normal breathing effort HEART: regular rate & rhythm, no lower extremity edema Musculoskeletal: no focal lumbar, hip or osseous tenderness in the low back. Normal gait   NEURO: alert & oriented x 3 with fluent speech  LABORATORY DATA:  I have reviewed the data as listed CBC Latest Ref Rng & Units 07/04/2019 06/25/2019 06/18/2019  WBC 4.0 - 10.5 K/uL 19.8(H) 17.6(H) 17.6(H)  Hemoglobin 13.0 - 17.0 g/dL 10.5(L) 10.8(L) 10.6(L)  Hematocrit 39.0 - 52.0 % 31.9(L) 32.8(L) 32.5(L)  Platelets 150 - 400 K/uL 212 197 210     CMP Latest Ref Rng & Units 06/12/2019 01/17/2019 09/02/2018  Glucose 65 - 99 mg/dL 80 156(H) -  BUN 8 - 27 mg/dL 26 24 -  Creatinine 0.76 - 1.27 mg/dL 1.20 1.17 1.24  Sodium 134 - 144 mmol/L 135 134 -  Potassium 3.5 - 5.2 mmol/L 4.7 5.4(H) -   Chloride 96 - 106 mmol/L 106 103 -  CO2 20 - 29 mmol/L 22 20 -  Calcium 8.6 - 10.2 mg/dL 8.8 8.9 -  Total Protein 6.0 - 8.5 g/dL 8.0 7.4 -  Total Bilirubin 0.0 - 1.2 mg/dL 0.6 0.7 -  Alkaline Phos 39 - 117 IU/L 76 126(H) -  AST 0 - 40 IU/L 15 29 -  ALT 0 - 44 IU/L 16 18 -  RADIOGRAPHIC STUDIES: I have personally reviewed the radiological images as listed and agreed with the findings in the report. No results found.   ASSESSMENT & PLAN: Paul Yielding Perkinsis a 79 y.o.malewith a history of AAA,Gout, DM, arthritis, HLD, HTN, vitamin deficiencies, H/o DVT of right femoral vein.   1. MDS/MPN, JAK2 (-), cytogenetics normal -Hehas had anemia since mid 2017 andHghasin 9-10 range most of time, he also has mild leukocytosis, no thrombocytosis  -Other contributions to his anemia include stage III chronic renal insufficiency and chronic gouty arthritis, heart disease, and pulmonary fibrosis.Previous iron study and B12 level were normal. She was on oral iron, off since early 2019 due to constipation. -[name] did fairly exhaustive evaluation to exclude occult malignancyincluding CT CAPwhichwas negative -11/4/19Bone marrow is consistent with a myeloproliferative disorder with dysplastic features. 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 He currently smokes 1/2ppd.  -CT chest from 10/2019shows mild emphysema and small rightpleural effusion,pulmonary parenchymal fibrosis, probable UIP.  -chest xray on 09/02/18 for cardiac work up shows pulmonary fibrosis; he is symptomatic with dyspnea, fatigue which are also symptoms of anemia.  -Previously recommended  pulmonology referral but patient declined   4. AAA, CAD with stent placed -followed by Dr. Doren Custard, vascular surgeon. -On Plavixand ASA which also helps reduce risk of thrombosis   5.Mild liver cirrhosis -h/o moderate alcohol; CT AP from 10/2017 which showed nodularity suspicious of cirrhosis.   6. H/o ofright LEDVT, provoked -Treated with 3 months of Xarelto remotely  Disposition:  Mr. Heinbaugh appears stable. He has been treated with retacrit 20K units weekly for past 2 months. His anemia improved and stabilized around 10. He improved symptomatically. Due to his multiple other provider visits and tiring of coming here often, he prefers to revert back to q2 weeks which I think is reasonable. He will proceed with injection today, then q2 weeks for now. Leukocytosis is slightly higher WBC 19 but stable overall. No thrombocytosis; he does not need treatment such as hydrea. Will monitor CBC closely. MCV dropped slightly, will check iron with next labs.   For acute bilateral back pain, will obtain lumbar xray. Previous lumbar film from 2016 showed compression fracture in L2 and L3 felt to be chronic and degenerative disc disease worse from L3-S1. Will f/u with results and likely refer back to his established ortho.   Lab q2 weeks with Retacrit, f/u in 8 weeks.   No problem-specific Assessment & Plan notes found for this encounter.   Orders Placed This Encounter  Procedures  . DG Lumbar Spine Complete    Standing Status:   Future    Number of Occurrences:   1    Standing Expiration Date:   07/03/2020    Order Specific Question:   Reason for Exam (SYMPTOM  OR DIAGNOSIS REQUIRED)    Answer:   acute low back pain 2 weeks    Order Specific Question:   Preferred imaging location?    Answer:   [facility]    Order Specific Question:   Radiology Contrast Protocol - do NOT remove file path    Answer:   [email or web address]  . Iron and TIBC     Standing Status:   Future  Standing Expiration Date:   07/03/2020  . Ferritin    Standing Status:   Future    Standing Expiration Date:   07/03/2020   All questions were answered. The patient knows to call the clinic with any problems, questions or concerns. No barriers to learning was detected.    Alla Feeling, NP 07/04/19

## 2019-07-04 ENCOUNTER — Inpatient Hospital Stay: Payer: Medicare Other | Attending: Hematology

## 2019-07-04 ENCOUNTER — Ambulatory Visit (HOSPITAL_COMMUNITY)
Admission: RE | Admit: 2019-07-04 | Discharge: 2019-07-04 | Disposition: A | Discharge status: Home / Self Care | Payer: Medicare Other | Source: Ambulatory Visit | Attending: Nurse Practitioner | Admitting: Nurse Practitioner

## 2019-07-04 ENCOUNTER — Encounter: Payer: Self-pay | Admitting: Nurse Practitioner

## 2019-07-04 ENCOUNTER — Inpatient Hospital Stay (HOSPITAL_BASED_OUTPATIENT_CLINIC_OR_DEPARTMENT_OTHER): Payer: Medicare Other | Admitting: Nurse Practitioner

## 2019-07-04 ENCOUNTER — Inpatient Hospital Stay: Payer: Medicare Other

## 2019-07-04 ENCOUNTER — Other Ambulatory Visit: Payer: Self-pay

## 2019-07-04 VITALS — BP 175/48 | HR 60 | Temp 97.8°F | Resp 18 | Ht 69.0 in | Wt 195.6 lb

## 2019-07-04 VITALS — BP 168/54

## 2019-07-04 DIAGNOSIS — N189 Chronic kidney disease, unspecified: Secondary | ICD-10-CM | POA: Diagnosis not present

## 2019-07-04 DIAGNOSIS — K7460 Unspecified cirrhosis of liver: Secondary | ICD-10-CM | POA: Diagnosis not present

## 2019-07-04 DIAGNOSIS — E538 Deficiency of other specified B group vitamins: Secondary | ICD-10-CM | POA: Insufficient documentation

## 2019-07-04 DIAGNOSIS — Z79899 Other long term (current) drug therapy: Secondary | ICD-10-CM | POA: Insufficient documentation

## 2019-07-04 DIAGNOSIS — D472 Monoclonal gammopathy: Secondary | ICD-10-CM | POA: Diagnosis not present

## 2019-07-04 DIAGNOSIS — M109 Gout, unspecified: Secondary | ICD-10-CM | POA: Insufficient documentation

## 2019-07-04 DIAGNOSIS — M545 Low back pain, unspecified: Secondary | ICD-10-CM

## 2019-07-04 DIAGNOSIS — I2510 Atherosclerotic heart disease of native coronary artery without angina pectoris: Secondary | ICD-10-CM | POA: Diagnosis not present

## 2019-07-04 DIAGNOSIS — Z7982 Long term (current) use of aspirin: Secondary | ICD-10-CM | POA: Insufficient documentation

## 2019-07-04 DIAGNOSIS — E785 Hyperlipidemia, unspecified: Secondary | ICD-10-CM | POA: Insufficient documentation

## 2019-07-04 DIAGNOSIS — D469 Myelodysplastic syndrome, unspecified: Secondary | ICD-10-CM

## 2019-07-04 DIAGNOSIS — M5137 Other intervertebral disc degeneration, lumbosacral region: Secondary | ICD-10-CM | POA: Insufficient documentation

## 2019-07-04 DIAGNOSIS — D638 Anemia in other chronic diseases classified elsewhere: Secondary | ICD-10-CM

## 2019-07-04 DIAGNOSIS — D631 Anemia in chronic kidney disease: Secondary | ICD-10-CM | POA: Diagnosis not present

## 2019-07-04 DIAGNOSIS — J439 Emphysema, unspecified: Secondary | ICD-10-CM | POA: Insufficient documentation

## 2019-07-04 DIAGNOSIS — I714 Abdominal aortic aneurysm, without rupture: Secondary | ICD-10-CM | POA: Insufficient documentation

## 2019-07-04 DIAGNOSIS — Z7901 Long term (current) use of anticoagulants: Secondary | ICD-10-CM | POA: Insufficient documentation

## 2019-07-04 DIAGNOSIS — I129 Hypertensive chronic kidney disease with stage 1 through stage 4 chronic kidney disease, or unspecified chronic kidney disease: Secondary | ICD-10-CM | POA: Insufficient documentation

## 2019-07-04 DIAGNOSIS — E119 Type 2 diabetes mellitus without complications: Secondary | ICD-10-CM | POA: Diagnosis not present

## 2019-07-04 LAB — CBC WITH DIFFERENTIAL (CANCER CENTER ONLY)
Abs Immature Granulocytes: 0.87 10*3/uL — ABNORMAL HIGH (ref 0.00–0.07)
Basophils Absolute: 0.2 10*3/uL — ABNORMAL HIGH (ref 0.0–0.1)
Basophils Relative: 1 %
Eosinophils Absolute: 0.1 10*3/uL (ref 0.0–0.5)
Eosinophils Relative: 1 %
HCT: 31.9 % — ABNORMAL LOW (ref 39.0–52.0)
Hemoglobin: 10.5 g/dL — ABNORMAL LOW (ref 13.0–17.0)
Immature Granulocytes: 4 %
Lymphocytes Relative: 19 %
Lymphs Abs: 3.7 10*3/uL (ref 0.7–4.0)
MCH: 26.1 pg (ref 26.0–34.0)
MCHC: 32.9 g/dL (ref 30.0–36.0)
MCV: 79.4 fL — ABNORMAL LOW (ref 80.0–100.0)
Monocytes Absolute: 0.3 10*3/uL (ref 0.1–1.0)
Monocytes Relative: 1 %
Neutro Abs: 14.7 10*3/uL — ABNORMAL HIGH (ref 1.7–7.7)
Neutrophils Relative %: 74 %
Platelet Count: 212 10*3/uL (ref 150–400)
RBC: 4.02 MIL/uL — ABNORMAL LOW (ref 4.22–5.81)
RDW: 18.9 % — ABNORMAL HIGH (ref 11.5–15.5)
WBC Count: 19.8 10*3/uL — ABNORMAL HIGH (ref 4.0–10.5)
nRBC: 0 % (ref 0.0–0.2)

## 2019-07-04 MED ORDER — EPOETIN ALFA-EPBX 10000 UNIT/ML IJ SOLN
INTRAMUSCULAR | Status: AC
  Filled 2019-07-04: qty 2

## 2019-07-04 MED ORDER — EPOETIN ALFA-EPBX 10000 UNIT/ML IJ SOLN
20000.0000 [IU] | Freq: Once | INTRAMUSCULAR | Status: AC
  Administered 2019-07-04: 20000 [IU] via SUBCUTANEOUS

## 2019-07-04 NOTE — Progress Notes (Signed)
Ok to give per MD. Notified pharmacy

## 2019-07-04 NOTE — Patient Instructions (Signed)

## 2019-07-05 ENCOUNTER — Telehealth: Payer: Self-pay | Admitting: Nurse Practitioner

## 2019-07-05 NOTE — Telephone Encounter (Signed)
Scheduled appt per 6/2 los.  Spoke with pt and they are aware of the appt date and time 

## 2019-07-06 ENCOUNTER — Telehealth: Payer: Self-pay

## 2019-07-06 NOTE — Telephone Encounter (Signed)
TC to pt per Cira Rue NP to let him know that his lumbar spine xray shows chronic compression fractures. I let him know that Lacie would like for him to f/u with ortho if he has one. Patient stated that he does have an ortho doctor and will make sure to follow up and verbalized understanding of xray results.

## 2019-07-18 ENCOUNTER — Inpatient Hospital Stay: Payer: Medicare Other

## 2019-07-18 ENCOUNTER — Other Ambulatory Visit: Payer: Self-pay

## 2019-07-18 VITALS — BP 159/72 | HR 72 | Temp 98.2°F | Resp 18

## 2019-07-18 DIAGNOSIS — M109 Gout, unspecified: Secondary | ICD-10-CM | POA: Diagnosis not present

## 2019-07-18 DIAGNOSIS — D469 Myelodysplastic syndrome, unspecified: Secondary | ICD-10-CM

## 2019-07-18 DIAGNOSIS — D638 Anemia in other chronic diseases classified elsewhere: Secondary | ICD-10-CM

## 2019-07-18 DIAGNOSIS — E119 Type 2 diabetes mellitus without complications: Secondary | ICD-10-CM | POA: Diagnosis not present

## 2019-07-18 DIAGNOSIS — I714 Abdominal aortic aneurysm, without rupture: Secondary | ICD-10-CM | POA: Diagnosis not present

## 2019-07-18 DIAGNOSIS — I129 Hypertensive chronic kidney disease with stage 1 through stage 4 chronic kidney disease, or unspecified chronic kidney disease: Secondary | ICD-10-CM | POA: Diagnosis not present

## 2019-07-18 DIAGNOSIS — D472 Monoclonal gammopathy: Secondary | ICD-10-CM | POA: Diagnosis not present

## 2019-07-18 DIAGNOSIS — I2510 Atherosclerotic heart disease of native coronary artery without angina pectoris: Secondary | ICD-10-CM | POA: Diagnosis not present

## 2019-07-18 DIAGNOSIS — E785 Hyperlipidemia, unspecified: Secondary | ICD-10-CM | POA: Diagnosis not present

## 2019-07-18 DIAGNOSIS — M5137 Other intervertebral disc degeneration, lumbosacral region: Secondary | ICD-10-CM | POA: Diagnosis not present

## 2019-07-18 DIAGNOSIS — E538 Deficiency of other specified B group vitamins: Secondary | ICD-10-CM | POA: Diagnosis not present

## 2019-07-18 DIAGNOSIS — Z7982 Long term (current) use of aspirin: Secondary | ICD-10-CM | POA: Diagnosis not present

## 2019-07-18 DIAGNOSIS — N189 Chronic kidney disease, unspecified: Secondary | ICD-10-CM | POA: Diagnosis not present

## 2019-07-18 DIAGNOSIS — D631 Anemia in chronic kidney disease: Secondary | ICD-10-CM | POA: Diagnosis not present

## 2019-07-18 DIAGNOSIS — Z7901 Long term (current) use of anticoagulants: Secondary | ICD-10-CM | POA: Diagnosis not present

## 2019-07-18 DIAGNOSIS — J439 Emphysema, unspecified: Secondary | ICD-10-CM | POA: Diagnosis not present

## 2019-07-18 DIAGNOSIS — K7460 Unspecified cirrhosis of liver: Secondary | ICD-10-CM | POA: Diagnosis not present

## 2019-07-18 DIAGNOSIS — Z79899 Other long term (current) drug therapy: Secondary | ICD-10-CM | POA: Diagnosis not present

## 2019-07-18 LAB — CBC WITH DIFFERENTIAL (CANCER CENTER ONLY)
Abs Immature Granulocytes: 0.34 10*3/uL — ABNORMAL HIGH (ref 0.00–0.07)
Basophils Absolute: 0.1 10*3/uL (ref 0.0–0.1)
Basophils Relative: 1 %
Eosinophils Absolute: 0.2 10*3/uL (ref 0.0–0.5)
Eosinophils Relative: 1 %
HCT: 30.7 % — ABNORMAL LOW (ref 39.0–52.0)
Hemoglobin: 10.2 g/dL — ABNORMAL LOW (ref 13.0–17.0)
Immature Granulocytes: 2 %
Lymphocytes Relative: 20 %
Lymphs Abs: 3.4 10*3/uL (ref 0.7–4.0)
MCH: 26.6 pg (ref 26.0–34.0)
MCHC: 33.2 g/dL (ref 30.0–36.0)
MCV: 79.9 fL — ABNORMAL LOW (ref 80.0–100.0)
Monocytes Absolute: 0.4 10*3/uL (ref 0.1–1.0)
Monocytes Relative: 2 %
Neutro Abs: 12.4 10*3/uL — ABNORMAL HIGH (ref 1.7–7.7)
Neutrophils Relative %: 74 %
Platelet Count: 198 10*3/uL (ref 150–400)
RBC: 3.84 MIL/uL — ABNORMAL LOW (ref 4.22–5.81)
RDW: 19.6 % — ABNORMAL HIGH (ref 11.5–15.5)
WBC Count: 16.8 10*3/uL — ABNORMAL HIGH (ref 4.0–10.5)
nRBC: 0 % (ref 0.0–0.2)

## 2019-07-18 LAB — FERRITIN: Ferritin: 46 ng/mL (ref 24–336)

## 2019-07-18 LAB — IRON AND TIBC
Iron: 70 ug/dL (ref 42–163)
Saturation Ratios: 25 % (ref 20–55)
TIBC: 283 ug/dL (ref 202–409)
UIBC: 213 ug/dL (ref 117–376)

## 2019-07-18 MED ORDER — EPOETIN ALFA-EPBX 10000 UNIT/ML IJ SOLN
INTRAMUSCULAR | Status: AC
  Filled 2019-07-18: qty 2

## 2019-07-18 MED ORDER — EPOETIN ALFA-EPBX 10000 UNIT/ML IJ SOLN
20000.0000 [IU] | Freq: Once | INTRAMUSCULAR | Status: AC
  Administered 2019-07-18: 20000 [IU] via SUBCUTANEOUS

## 2019-07-18 NOTE — Patient Instructions (Signed)

## 2019-07-31 DIAGNOSIS — M25562 Pain in left knee: Secondary | ICD-10-CM | POA: Diagnosis not present

## 2019-07-31 DIAGNOSIS — M0579 Rheumatoid arthritis with rheumatoid factor of multiple sites without organ or systems involvement: Secondary | ICD-10-CM | POA: Diagnosis not present

## 2019-07-31 DIAGNOSIS — Z7189 Other specified counseling: Secondary | ICD-10-CM | POA: Diagnosis not present

## 2019-07-31 DIAGNOSIS — M109 Gout, unspecified: Secondary | ICD-10-CM | POA: Diagnosis not present

## 2019-07-31 DIAGNOSIS — I82A11 Acute embolism and thrombosis of right axillary vein: Secondary | ICD-10-CM | POA: Diagnosis not present

## 2019-08-01 ENCOUNTER — Inpatient Hospital Stay: Payer: Medicare Other

## 2019-08-01 ENCOUNTER — Other Ambulatory Visit: Payer: Self-pay

## 2019-08-01 VITALS — BP 138/66 | HR 69 | Temp 98.4°F | Resp 18

## 2019-08-01 DIAGNOSIS — D472 Monoclonal gammopathy: Secondary | ICD-10-CM | POA: Diagnosis not present

## 2019-08-01 DIAGNOSIS — M5137 Other intervertebral disc degeneration, lumbosacral region: Secondary | ICD-10-CM | POA: Diagnosis not present

## 2019-08-01 DIAGNOSIS — N189 Chronic kidney disease, unspecified: Secondary | ICD-10-CM | POA: Diagnosis not present

## 2019-08-01 DIAGNOSIS — Z79899 Other long term (current) drug therapy: Secondary | ICD-10-CM | POA: Diagnosis not present

## 2019-08-01 DIAGNOSIS — Z7901 Long term (current) use of anticoagulants: Secondary | ICD-10-CM | POA: Diagnosis not present

## 2019-08-01 DIAGNOSIS — D638 Anemia in other chronic diseases classified elsewhere: Secondary | ICD-10-CM

## 2019-08-01 DIAGNOSIS — E119 Type 2 diabetes mellitus without complications: Secondary | ICD-10-CM | POA: Diagnosis not present

## 2019-08-01 DIAGNOSIS — E785 Hyperlipidemia, unspecified: Secondary | ICD-10-CM | POA: Diagnosis not present

## 2019-08-01 DIAGNOSIS — I714 Abdominal aortic aneurysm, without rupture: Secondary | ICD-10-CM | POA: Diagnosis not present

## 2019-08-01 DIAGNOSIS — Z7982 Long term (current) use of aspirin: Secondary | ICD-10-CM | POA: Diagnosis not present

## 2019-08-01 DIAGNOSIS — D631 Anemia in chronic kidney disease: Secondary | ICD-10-CM | POA: Diagnosis not present

## 2019-08-01 DIAGNOSIS — J439 Emphysema, unspecified: Secondary | ICD-10-CM | POA: Diagnosis not present

## 2019-08-01 DIAGNOSIS — M109 Gout, unspecified: Secondary | ICD-10-CM | POA: Diagnosis not present

## 2019-08-01 DIAGNOSIS — D469 Myelodysplastic syndrome, unspecified: Secondary | ICD-10-CM

## 2019-08-01 DIAGNOSIS — I2510 Atherosclerotic heart disease of native coronary artery without angina pectoris: Secondary | ICD-10-CM | POA: Diagnosis not present

## 2019-08-01 DIAGNOSIS — K7460 Unspecified cirrhosis of liver: Secondary | ICD-10-CM | POA: Diagnosis not present

## 2019-08-01 DIAGNOSIS — I129 Hypertensive chronic kidney disease with stage 1 through stage 4 chronic kidney disease, or unspecified chronic kidney disease: Secondary | ICD-10-CM | POA: Diagnosis not present

## 2019-08-01 DIAGNOSIS — E538 Deficiency of other specified B group vitamins: Secondary | ICD-10-CM | POA: Diagnosis not present

## 2019-08-01 LAB — CBC WITH DIFFERENTIAL (CANCER CENTER ONLY)
Abs Immature Granulocytes: 0.43 10*3/uL — ABNORMAL HIGH (ref 0.00–0.07)
Basophils Absolute: 0.1 10*3/uL (ref 0.0–0.1)
Basophils Relative: 1 %
Eosinophils Absolute: 0.2 10*3/uL (ref 0.0–0.5)
Eosinophils Relative: 1 %
HCT: 30.8 % — ABNORMAL LOW (ref 39.0–52.0)
Hemoglobin: 10.2 g/dL — ABNORMAL LOW (ref 13.0–17.0)
Immature Granulocytes: 2 %
Lymphocytes Relative: 19 %
Lymphs Abs: 3.4 10*3/uL (ref 0.7–4.0)
MCH: 26.2 pg (ref 26.0–34.0)
MCHC: 33.1 g/dL (ref 30.0–36.0)
MCV: 79.2 fL — ABNORMAL LOW (ref 80.0–100.0)
Monocytes Absolute: 0.4 10*3/uL (ref 0.1–1.0)
Monocytes Relative: 2 %
Neutro Abs: 13.6 10*3/uL — ABNORMAL HIGH (ref 1.7–7.7)
Neutrophils Relative %: 75 %
Platelet Count: 195 10*3/uL (ref 150–400)
RBC: 3.89 MIL/uL — ABNORMAL LOW (ref 4.22–5.81)
RDW: 20 % — ABNORMAL HIGH (ref 11.5–15.5)
WBC Count: 18.2 10*3/uL — ABNORMAL HIGH (ref 4.0–10.5)
nRBC: 0 % (ref 0.0–0.2)

## 2019-08-01 MED ORDER — EPOETIN ALFA-EPBX 10000 UNIT/ML IJ SOLN
INTRAMUSCULAR | Status: AC
  Filled 2019-08-01: qty 2

## 2019-08-01 MED ORDER — EPOETIN ALFA-EPBX 10000 UNIT/ML IJ SOLN
20000.0000 [IU] | Freq: Once | INTRAMUSCULAR | Status: AC
  Administered 2019-08-01: 20000 [IU] via SUBCUTANEOUS

## 2019-08-01 NOTE — Patient Instructions (Signed)
Darbepoetin Alfa injection What is this medicine? DARBEPOETIN ALFA (dar be POE e tin AL fa) helps your body make more red blood cells. It is used to treat anemia caused by chronic kidney failure and chemotherapy. This medicine may be used for other purposes; ask your health care provider or pharmacist if you have questions. COMMON BRAND NAME(S): Aranesp What should I tell my health care provider before I take this medicine? They need to know if you have any of these conditions:  blood clotting disorders or history of blood clots  cancer patient not on chemotherapy  cystic fibrosis  heart disease, such as angina, heart failure, or a history of a heart attack  hemoglobin level of 12 g/dL or greater  high blood pressure  low levels of folate, iron, or vitamin B12  seizures  an unusual or allergic reaction to darbepoetin, erythropoietin, albumin, hamster proteins, latex, other medicines, foods, dyes, or preservatives  pregnant or trying to get pregnant  breast-feeding How should I use this medicine? This medicine is for injection into a vein or under the skin. It is usually given by a health care professional in a hospital or clinic setting. If you get this medicine at home, you will be taught how to prepare and give this medicine. Use exactly as directed. Take your medicine at regular intervals. Do not take your medicine more often than directed. It is important that you put your used needles and syringes in a special sharps container. Do not put them in a trash can. If you do not have a sharps container, call your pharmacist or healthcare provider to get one. A special MedGuide will be given to you by the pharmacist with each prescription and refill. Be sure to read this information carefully each time. Talk to your pediatrician regarding the use of this medicine in children. While this medicine may be used in children as young as 1 month of age for selected conditions, precautions do  apply. Overdosage: If you think you have taken too much of this medicine contact a poison control center or emergency room at once. NOTE: This medicine is only for you. Do not share this medicine with others. What if I miss a dose? If you miss a dose, take it as soon as you can. If it is almost time for your next dose, take only that dose. Do not take double or extra doses. What may interact with this medicine? Do not take this medicine with any of the following medications:  epoetin alfa This list may not describe all possible interactions. Give your health care provider a list of all the medicines, herbs, non-prescription drugs, or dietary supplements you use. Also tell them if you smoke, drink alcohol, or use illegal drugs. Some items may interact with your medicine. What should I watch for while using this medicine? Your condition will be monitored carefully while you are receiving this medicine. You may need blood work done while you are taking this medicine. This medicine may cause a decrease in vitamin B6. You should make sure that you get enough vitamin B6 while you are taking this medicine. Discuss the foods you eat and the vitamins you take with your health care professional. What side effects may I notice from receiving this medicine? Side effects that you should report to your doctor or health care professional as soon as possible:  allergic reactions like skin rash, itching or hives, swelling of the face, lips, or tongue  breathing problems  changes in   vision  chest pain  confusion, trouble speaking or understanding  feeling faint or lightheaded, falls  high blood pressure  muscle aches or pains  pain, swelling, warmth in the leg  rapid weight gain  severe headaches  sudden numbness or weakness of the face, arm or leg  trouble walking, dizziness, loss of balance or coordination  seizures (convulsions)  swelling of the ankles, feet, hands  unusually weak or  tired Side effects that usually do not require medical attention (report to your doctor or health care professional if they continue or are bothersome):  diarrhea  fever, chills (flu-like symptoms)  headaches  nausea, vomiting  redness, stinging, or swelling at site where injected This list may not describe all possible side effects. Call your doctor for medical advice about side effects. You may report side effects to FDA at 1-800-FDA-1088. Where should I keep my medicine? Keep out of the reach of children. Store in a refrigerator between 2 and 8 degrees C (36 and 46 degrees F). Do not freeze. Do not shake. Throw away any unused portion if using a single-dose vial. Throw away any unused medicine after the expiration date. NOTE: This sheet is a summary. It may not cover all possible information. If you have questions about this medicine, talk to your doctor, pharmacist, or health care provider.  2020 Elsevier/Gold Standard (2017-02-02 16:44:20)  

## 2019-08-15 ENCOUNTER — Other Ambulatory Visit: Payer: Self-pay

## 2019-08-15 ENCOUNTER — Inpatient Hospital Stay: Payer: Medicare Other

## 2019-08-15 ENCOUNTER — Inpatient Hospital Stay: Payer: Medicare Other | Attending: Hematology

## 2019-08-15 VITALS — BP 128/66

## 2019-08-15 DIAGNOSIS — N189 Chronic kidney disease, unspecified: Secondary | ICD-10-CM | POA: Insufficient documentation

## 2019-08-15 DIAGNOSIS — K5900 Constipation, unspecified: Secondary | ICD-10-CM | POA: Insufficient documentation

## 2019-08-15 DIAGNOSIS — E860 Dehydration: Secondary | ICD-10-CM | POA: Diagnosis not present

## 2019-08-15 DIAGNOSIS — I129 Hypertensive chronic kidney disease with stage 1 through stage 4 chronic kidney disease, or unspecified chronic kidney disease: Secondary | ICD-10-CM | POA: Insufficient documentation

## 2019-08-15 DIAGNOSIS — Z79899 Other long term (current) drug therapy: Secondary | ICD-10-CM | POA: Diagnosis not present

## 2019-08-15 DIAGNOSIS — I2510 Atherosclerotic heart disease of native coronary artery without angina pectoris: Secondary | ICD-10-CM | POA: Insufficient documentation

## 2019-08-15 DIAGNOSIS — D469 Myelodysplastic syndrome, unspecified: Secondary | ICD-10-CM

## 2019-08-15 DIAGNOSIS — E119 Type 2 diabetes mellitus without complications: Secondary | ICD-10-CM | POA: Insufficient documentation

## 2019-08-15 DIAGNOSIS — M549 Dorsalgia, unspecified: Secondary | ICD-10-CM | POA: Diagnosis not present

## 2019-08-15 DIAGNOSIS — E538 Deficiency of other specified B group vitamins: Secondary | ICD-10-CM | POA: Insufficient documentation

## 2019-08-15 DIAGNOSIS — D638 Anemia in other chronic diseases classified elsewhere: Secondary | ICD-10-CM

## 2019-08-15 DIAGNOSIS — E559 Vitamin D deficiency, unspecified: Secondary | ICD-10-CM | POA: Insufficient documentation

## 2019-08-15 DIAGNOSIS — Z86718 Personal history of other venous thrombosis and embolism: Secondary | ICD-10-CM | POA: Diagnosis not present

## 2019-08-15 DIAGNOSIS — S51812A Laceration without foreign body of left forearm, initial encounter: Secondary | ICD-10-CM | POA: Insufficient documentation

## 2019-08-15 DIAGNOSIS — Z7982 Long term (current) use of aspirin: Secondary | ICD-10-CM | POA: Insufficient documentation

## 2019-08-15 DIAGNOSIS — R197 Diarrhea, unspecified: Secondary | ICD-10-CM | POA: Insufficient documentation

## 2019-08-15 DIAGNOSIS — I714 Abdominal aortic aneurysm, without rupture: Secondary | ICD-10-CM | POA: Insufficient documentation

## 2019-08-15 DIAGNOSIS — D72829 Elevated white blood cell count, unspecified: Secondary | ICD-10-CM | POA: Diagnosis not present

## 2019-08-15 DIAGNOSIS — Z7902 Long term (current) use of antithrombotics/antiplatelets: Secondary | ICD-10-CM | POA: Diagnosis not present

## 2019-08-15 DIAGNOSIS — D631 Anemia in chronic kidney disease: Secondary | ICD-10-CM | POA: Diagnosis not present

## 2019-08-15 DIAGNOSIS — J439 Emphysema, unspecified: Secondary | ICD-10-CM | POA: Insufficient documentation

## 2019-08-15 LAB — CBC WITH DIFFERENTIAL (CANCER CENTER ONLY)
Abs Immature Granulocytes: 1.21 10*3/uL — ABNORMAL HIGH (ref 0.00–0.07)
Basophils Absolute: 0.2 10*3/uL — ABNORMAL HIGH (ref 0.0–0.1)
Basophils Relative: 1 %
Eosinophils Absolute: 0.3 10*3/uL (ref 0.0–0.5)
Eosinophils Relative: 1 %
HCT: 29.2 % — ABNORMAL LOW (ref 39.0–52.0)
Hemoglobin: 9.9 g/dL — ABNORMAL LOW (ref 13.0–17.0)
Immature Granulocytes: 5 %
Lymphocytes Relative: 15 %
Lymphs Abs: 3.3 10*3/uL (ref 0.7–4.0)
MCH: 26.7 pg (ref 26.0–34.0)
MCHC: 33.9 g/dL (ref 30.0–36.0)
MCV: 78.7 fL — ABNORMAL LOW (ref 80.0–100.0)
Monocytes Absolute: 0.9 10*3/uL (ref 0.1–1.0)
Monocytes Relative: 4 %
Neutro Abs: 17.1 10*3/uL — ABNORMAL HIGH (ref 1.7–7.7)
Neutrophils Relative %: 74 %
Platelet Count: 319 10*3/uL (ref 150–400)
RBC: 3.71 MIL/uL — ABNORMAL LOW (ref 4.22–5.81)
RDW: 20 % — ABNORMAL HIGH (ref 11.5–15.5)
WBC Count: 23 10*3/uL — ABNORMAL HIGH (ref 4.0–10.5)
nRBC: 0 % (ref 0.0–0.2)

## 2019-08-15 MED ORDER — EPOETIN ALFA-EPBX 10000 UNIT/ML IJ SOLN
20000.0000 [IU] | Freq: Once | INTRAMUSCULAR | Status: AC
  Administered 2019-08-15: 20000 [IU] via SUBCUTANEOUS

## 2019-08-15 NOTE — Patient Instructions (Signed)

## 2019-08-17 ENCOUNTER — Ambulatory Visit (INDEPENDENT_AMBULATORY_CARE_PROVIDER_SITE_OTHER): Payer: Medicare Other | Admitting: Podiatry

## 2019-08-17 ENCOUNTER — Encounter: Payer: Self-pay | Admitting: Podiatry

## 2019-08-17 ENCOUNTER — Ambulatory Visit (INDEPENDENT_AMBULATORY_CARE_PROVIDER_SITE_OTHER): Payer: Medicare Other

## 2019-08-17 ENCOUNTER — Other Ambulatory Visit: Payer: Self-pay

## 2019-08-17 VITALS — BP 168/62 | HR 60 | Temp 97.9°F

## 2019-08-17 DIAGNOSIS — L97521 Non-pressure chronic ulcer of other part of left foot limited to breakdown of skin: Secondary | ICD-10-CM | POA: Diagnosis not present

## 2019-08-17 DIAGNOSIS — M19079 Primary osteoarthritis, unspecified ankle and foot: Secondary | ICD-10-CM

## 2019-08-17 DIAGNOSIS — M1A9XX0 Chronic gout, unspecified, without tophus (tophi): Secondary | ICD-10-CM | POA: Diagnosis not present

## 2019-08-17 DIAGNOSIS — M205X2 Other deformities of toe(s) (acquired), left foot: Secondary | ICD-10-CM | POA: Diagnosis not present

## 2019-08-17 NOTE — Progress Notes (Signed)
  Subjective:  Patient ID: Paul [name], male    DOB: 04/25/1940,  MRN: [id number]  Chief Complaint  Patient presents with  . Wound Check    Pt states left forefoot has redness/swelling 2 days duration, no know injuries. Pt denies fever/chills/nausea/vomiting.    79 y.o. male presents with the above complaint. History confirmed with patient.  He has previously seen Dr. March Rummage for a callus and ulcer on the plantar medial left hallux.  He said few days ago his left forefoot began swelling and redness and was quite painful.  Objective:  Physical Exam: warm, good capillary refill, no trophic changes or ulcerative lesions, normal DP and PT pulses and reduced sensation at plantar soles of feet. Left Foot: Preulcerative callus on the plantar medial left hallux, hallux rigidus and hallux interphalangeal rigidus present.  Moderate edema and erythema diffusely spread throughout the left forefoot mild pain with palpation.  No ascending cellulitis no evidence of open wound or acute infection Radiographs: X-ray of the left foot: Moderate arthritis of the first metatarsal and interphalangeal joints of the hallux.  No evidence of osteomyelitis or soft tissue infection. Assessment:   1. Ulcer of great toe, left, limited to breakdown of skin ([name])   2. Hallux limitus of left foot   3. Arthritis of big toe   4. Chronic gout involving toe of left foot without tophus, unspecified cause      Plan:  Patient was evaluated and treated and all questions answered.  -His forefoot pain, edema, and erythema is in the absence of any known trauma and does not appear to be consistent with any sort of infection.  He does have a history of chronic gout, and is on Uloric for this.  He possibly could have a flare of this still.  I have ordered some lab work including a ESR, uric acid, and metabolic panel to evaluate this.  I will call with lab results  -Patient is diabetic with a qualifying condition for at risk foot  care. Procedure: Paring of Lesion Rationale: painful hyperkeratotic preulcerative lesion Type of Debridement: manual, sharp debridement. Instrumentation: #15 blade Number of Lesions: 1  Return in about 4 weeks (around 09/14/2019) for with Dr March Rummage, wound re-check.   Lanae Crumbly, DPM 08/17/2019

## 2019-08-23 NOTE — Progress Notes (Signed)
[place]   Telephone:(336) [phone] Fax:(336) [phone]   Clinic Follow up Note   Patient Care Team: Denita Lung, MD as PCP - General (Family Medicine) Burnell Blanks, MD as PCP - Cardiology (Cardiology)  Date of Service:  08/29/2019  CHIEF COMPLAINT: F/u ofMDS/MPN, with mildleukocytosis and moderate anemia,JAK2 V617F (-)  CURRENT THERAPY:  Retacrit monthly started 10/30/18. Will increase to every 2 weeks starting 01/29/19. Increased to weekly on 05/07/19. Decreased back to every 2 weeks from 07/04/19. Increased to every 10 days on 08/09/19  INTERVAL HISTORY:  Paul [name] is here for a follow up. He presents to the clinic alone. He notes his sister's dog jumped on his and scratched his left upper forearm 4 days ago. This is covered with bandage. He has mild to moderate bleeding from this. He notes he has been more fatigued lately. He also notes SOB with walking. He notes he cannot walk to his mail box and back. He is overall not very active. He is mostly sitting.    REVIEW OF SYSTEMS:   Constitutional: Denies fevers, chills or abnormal weight loss (+) Fatigue Eyes: Denies blurriness of vision Ears, nose, mouth, throat, and face: Denies mucositis or sore throat Respiratory: Denies cough or wheezes (+) SOB with walking Cardiovascular: Denies palpitation, chest discomfort or lower extremity swelling Gastrointestinal:  Denies nausea, heartburn or change in bowel habits Skin: Denies abnormal skin rashes (+) Mild open wound of left upper forearm Lymphatics: Denies new lymphadenopathy or easy bruising Neurological:Denies numbness, tingling or new weaknesses Behavioral/Psych: Mood is stable, no new changes  All other systems were reviewed with the patient and are negative.  MEDICAL HISTORY:  Past Medical History:  Diagnosis Date  . AAA (abdominal aortic aneurysm) ([name])   . Abnormal LFTs   . AKI (acute kidney injury) ([name])   . Anemia of chronic disease    . Arthritis    "all over" (12/04/2015)  . Atherosclerosis of native coronary artery of native heart without angina pectoris   . CAD (coronary artery disease)    with cath 09/26/08 showing severe heart disease in RCA with POBA, mild to mod diesease in circ and LAD  . Chronic back pain    back problems prior to surgery   . Constipation   . Coronary atherosclerosis of native coronary artery   . Dehydration   . Diabetes mellitus    with neuropathy  . Diarrhea   . Elevated liver enzymes   . Generalized weakness   . Gout of right hand   . Hiatal hernia   . History of gastroesophageal reflux (GERD)   . History of stomach ulcers   . Hyperlipidemia   . Hypertension   . Metabolic acidosis   . Normochromic anemia 02/23/2016  . 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 Thrombocytopenia ([place])   . Unintentional weight loss 09/27/2017   33 lbs between 4/19 & 8/19  . Vitamin B12 deficiency   . Vitamin B12 deficiency   . Vitamin D deficiency   . Weakness     SURGICAL HISTORY: Past Surgical History:  Procedure Laterality Date  . ANKLE FRACTURE SURGERY Left    "crushed"  . BACK SURGERY  X 3   "fell 4 stories in 1977"  . CARPAL TUNNEL RELEASE Left   . CORONARY ANGIOPLASTY WITH STENT PLACEMENT  09/26/2008   [name] 06/03/2010  . FRACTURE SURGERY    . LEFT HEART CATHETERIZATION WITH CORONARY ANGIOGRAM N/A 01/21/2012   Procedure: LEFT HEART CATHETERIZATION WITH  CORONARY ANGIOGRAM;  Surgeon: Burnell Blanks, MD;  Location: [facility] CATH LAB;  Service: Cardiovascular;  Laterality: N/A;  . LUMBAR [place]; ~ 1980  . POSTERIOR LUMBAR FUSION  ~ 1998   "3rd OR to reconstruct my back after fall in 1977"  . 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  ALLERGIES:  is allergic to allopurinol and metformin and related.  MEDICATIONS:  Current Outpatient Medications  Medication Sig  Dispense Refill  . amLODipine (NORVASC) 5 MG tablet Take 1 tablet (5 mg total) by mouth daily. 90 tablet 3  . aspirin 81 MG chewable tablet Chew 81 mg by mouth daily.    . clopidogrel (PLAVIX) 75 MG tablet TAKE 1 TABLET BY MOUTH  DAILY 90 tablet 3  . ENBREL SURECLICK 50 MG/ML injection Inject into the skin once a week.    . febuxostat (ULORIC) 40 MG tablet Take 40 mg by mouth daily.    . fish oil-omega-3 fatty acids 1000 MG capsule Take 1 g by mouth 2 (two) times daily.     Marland Kitchen glucose blood (ACCU-CHEK AVIVA PLUS) test strip Test once to twice daily 200 each 4  . isosorbide mononitrate (IMDUR) 30 MG 24 hr tablet Take one tablet by mouth every morning, take two tablets by mouth every evening 270 tablet 3  . leflunomide (ARAVA) 20 MG tablet Take 20 mg by mouth daily.    . metoprolol tartrate (LOPRESSOR) 25 MG tablet Take 1 tablet (25 mg total) by mouth 2 (two) times daily. 180 tablet 3  . Multiple Vitamins-Minerals (CENTRUM) tablet Take 1 tablet by mouth daily.    . nitroGLYCERIN (NITROSTAT) 0.4 MG SL tablet Place 0.4 mg under the tongue every 5 (five) minutes as needed for chest pain. For 3 doses    . pyridoxine (B-6) 100 MG tablet Take 100 mg by mouth daily.    . silver sulfADIAZINE (SILVADENE) 1 % cream Apply pea-sized amount to wound daily. 50 g 0  . simvastatin (ZOCOR) 20 MG tablet Take 1 tablet (20 mg total) by mouth at bedtime. 90 tablet 3  . predniSONE (DELTASONE) 5 MG tablet Take 5-10 mg by mouth daily.     No current facility-administered medications for this visit.    PHYSICAL EXAMINATION: ECOG PERFORMANCE STATUS: 2 - Symptomatic, <50% confined to bed  Vitals:   08/29/19 1325  BP: (!) 165/42  Pulse: 58  Resp: 17  Temp: 97.9 F (36.6 C)  SpO2: 98%   Filed Weights   08/29/19 1325  Weight: 192 lb 8 oz (87.3 kg)    Due to COVID19 we will limit examination to appearance. Patient had no complaints.  GENERAL:alert, no distress and comfortable SKIN: skin color normal, no rashes  or significant lesions (+) Mild open wound of left upper forearm, covered in bandage EYES: normal, Conjunctiva are pink and non-injected, sclera clear  NEURO: alert & oriented x 3 with fluent speech   LABORATORY DATA:  I have reviewed the data as listed CBC Latest Ref Rng & Units 08/29/2019 08/15/2019 08/01/2019  WBC 4.0 - 10.5 K/uL 14.5(H) 23.0(H) 18.2(H)  Hemoglobin 13.0 - 17.0 g/dL 9.3(L) 9.9(L) 10.2(L)  Hematocrit 39 - 52 % 27.9(L) 29.2(L) 30.8(L)  Platelets 150 - 400 K/uL 301 319 195     CMP Latest Ref Rng & Units 06/12/2019 01/17/2019 09/02/2018  Glucose 65 - 99  mg/dL 80 156(H) -  BUN 8 - 27 mg/dL 26 24 -  Creatinine 0.76 - 1.27 mg/dL 1.20 1.17 1.24  Sodium 134 - 144 mmol/L 135 134 -  Potassium 3.5 - 5.2 mmol/L 4.7 5.4(H) -  Chloride 96 - 106 mmol/L 106 103 -  CO2 20 - 29 mmol/L 22 20 -  Calcium 8.6 - 10.2 mg/dL 8.8 8.9 -  Total Protein 6.0 - 8.5 g/dL 8.0 7.4 -  Total Bilirubin 0.0 - 1.2 mg/dL 0.6 0.7 -  Alkaline Phos 39 - 117 IU/L 76 126(H) -  AST 0 - 40 IU/L 15 29 -  ALT 0 - 44 IU/L 16 18 -      RADIOGRAPHIC STUDIES: I have personally reviewed the radiological images as listed and agreed with the findings in the report. No results found.   ASSESSMENT & PLAN:  Paul [name] is a 79 y.o. male with    1. MDS/MPN, JAK2 (-), cytogenetics normal -Hehas had anemia issues since mid 2017 andHghasin 9-10 range most of time, he also has mild leukocytosis, thrombocytosisresolved now -Other contributions to his anemia include stage III chronic renal insufficiency and chronic gouty arthritis.Previous iron study and B12 level were normal. 11/4/19Bone marrow is consistent with a myeloproliferative disorder with dysplastic features. -His leukocytosis is very mild, no thrombocytosis, risk of thrombosis is not very high, no need treatment such as hydrea.He is currently on aspirin and Plavix, which also helps to prevent thrombosis. -His anemia has worsened so I started him  on treatment with monthly Retacrit beginning 10/30/18. Aranesp was not approved by his insurance.Retacrit increased to every 2 weeks on 01/29/19, increased to weekly on 05/07/19. He responded well and able to reduce to every 2 weeks on 07/04/19.  -He notes he has been feeling more fatigued and SOB this week after being jumped on and scratched by the family dog. This has lead to moderate bleeding. Labs reviewed from today, WBC 14.5, Hg 9.3, ANC 10.2.  -I discussed increasing frequency of Retacrit 3-4 times a month to maintain blood counts and his energy. He is agreeable. Will proceed with Retacrit today then change to every 10 days.  -His 07/2019 iron panel showed Iron 70, TIBC 283, Ferritin 46. I discussed increasing oral iron to BID. If not enough I discussed option of IV iron for more direct correction. He is agreeable. Will watch for constipation.  -F/u in 2 months   2.Gout, RA, back pain -Had allergic reaction to Allopurinol  -Now controlled withcolchicine and Uloric -For his RA he is on Enbrel.   -For recent acute bilateral back pain, a lumbar xray was obtained on 07/04/19 and showed stable chronic compression fractures of L2-L3 along with degenerative disc disease.  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 He currently smokes 1/2ppd.  -CT chest from 11/2017 shows mild emphysema and small rightpleural effusion,pulmonary parenchymal fibrosis, probable UIP.   -He was strongly encouraged to stop smoking to prevent further exacerbation and may need oxygen.  -chest xray on 09/02/18 for cardiac work up shows pulmonary fibrosis; he is symptomatic with dyspnea, fatigue which are also symptoms of anemia.  -Previously recommended pulmonology referral but patient declined -pulmonary symptoms are stable overall   4. AAA, CAD with stent placed -followed by Dr. Doren Custard, vascular surgeon. -On Plavixand ASA which also helps reduce risk of thrombosis    5.Mild liver cirrhosis -h/o moderate alcohol;CT AP from 10/2017 which showed nodularity suspicious of  cirrhosis.   6. H/o ofright LEDVT, provoked -Treated with 3 months of Xarelto, off now  7. Recent Left Forearm laceration  -After being scratched by the family dog he had moderate bleeding at mild wound of left upper forearm. He is on ASA and plavix  -I will change dressing for him in clinic today, and encourage him to watch with soap and water daily, contact his PCP if not healing    PLAN: -Increase Oral iron to BID.  -Proceed with Retacrit today, and will increase injection from every 14 days to every 10 days  -lab and Retacrit every 10 days for 2 months  -F/u in 2 months with lab   No problem-specific Assessment & Plan notes found for this encounter.   No orders of the defined types were placed in this encounter.  All questions were answered. The patient knows to call the clinic with any problems, questions or concerns. No barriers to learning was detected. The total time spent in the appointment was 30 minutes.     Truitt Merle, MD 08/29/2019   I, Joslyn Devon, am acting as scribe for Truitt Merle, MD.   I have reviewed the above documentation for accuracy and completeness, and I agree with the above.

## 2019-08-29 ENCOUNTER — Other Ambulatory Visit: Payer: Self-pay

## 2019-08-29 ENCOUNTER — Inpatient Hospital Stay: Payer: Medicare Other

## 2019-08-29 ENCOUNTER — Encounter: Payer: Self-pay | Admitting: Hematology

## 2019-08-29 ENCOUNTER — Inpatient Hospital Stay (HOSPITAL_BASED_OUTPATIENT_CLINIC_OR_DEPARTMENT_OTHER): Payer: Medicare Other | Admitting: Hematology

## 2019-08-29 VITALS — BP 165/42 | HR 58 | Temp 97.9°F | Resp 17 | Ht 69.0 in | Wt 192.5 lb

## 2019-08-29 DIAGNOSIS — Z7902 Long term (current) use of antithrombotics/antiplatelets: Secondary | ICD-10-CM | POA: Diagnosis not present

## 2019-08-29 DIAGNOSIS — E538 Deficiency of other specified B group vitamins: Secondary | ICD-10-CM | POA: Diagnosis not present

## 2019-08-29 DIAGNOSIS — D638 Anemia in other chronic diseases classified elsewhere: Secondary | ICD-10-CM

## 2019-08-29 DIAGNOSIS — N189 Chronic kidney disease, unspecified: Secondary | ICD-10-CM | POA: Diagnosis not present

## 2019-08-29 DIAGNOSIS — R197 Diarrhea, unspecified: Secondary | ICD-10-CM | POA: Diagnosis not present

## 2019-08-29 DIAGNOSIS — D469 Myelodysplastic syndrome, unspecified: Secondary | ICD-10-CM | POA: Diagnosis not present

## 2019-08-29 DIAGNOSIS — D72829 Elevated white blood cell count, unspecified: Secondary | ICD-10-CM | POA: Diagnosis not present

## 2019-08-29 DIAGNOSIS — I129 Hypertensive chronic kidney disease with stage 1 through stage 4 chronic kidney disease, or unspecified chronic kidney disease: Secondary | ICD-10-CM | POA: Diagnosis not present

## 2019-08-29 DIAGNOSIS — Z79899 Other long term (current) drug therapy: Secondary | ICD-10-CM | POA: Diagnosis not present

## 2019-08-29 DIAGNOSIS — Z86718 Personal history of other venous thrombosis and embolism: Secondary | ICD-10-CM | POA: Diagnosis not present

## 2019-08-29 DIAGNOSIS — I714 Abdominal aortic aneurysm, without rupture: Secondary | ICD-10-CM | POA: Diagnosis not present

## 2019-08-29 DIAGNOSIS — J439 Emphysema, unspecified: Secondary | ICD-10-CM | POA: Diagnosis not present

## 2019-08-29 DIAGNOSIS — I2510 Atherosclerotic heart disease of native coronary artery without angina pectoris: Secondary | ICD-10-CM | POA: Diagnosis not present

## 2019-08-29 DIAGNOSIS — E860 Dehydration: Secondary | ICD-10-CM | POA: Diagnosis not present

## 2019-08-29 DIAGNOSIS — S51812A Laceration without foreign body of left forearm, initial encounter: Secondary | ICD-10-CM | POA: Diagnosis not present

## 2019-08-29 DIAGNOSIS — E559 Vitamin D deficiency, unspecified: Secondary | ICD-10-CM | POA: Diagnosis not present

## 2019-08-29 DIAGNOSIS — Z7982 Long term (current) use of aspirin: Secondary | ICD-10-CM | POA: Diagnosis not present

## 2019-08-29 DIAGNOSIS — M549 Dorsalgia, unspecified: Secondary | ICD-10-CM | POA: Diagnosis not present

## 2019-08-29 DIAGNOSIS — D631 Anemia in chronic kidney disease: Secondary | ICD-10-CM | POA: Diagnosis not present

## 2019-08-29 DIAGNOSIS — K5900 Constipation, unspecified: Secondary | ICD-10-CM | POA: Diagnosis not present

## 2019-08-29 DIAGNOSIS — E119 Type 2 diabetes mellitus without complications: Secondary | ICD-10-CM | POA: Diagnosis not present

## 2019-08-29 LAB — CBC WITH DIFFERENTIAL (CANCER CENTER ONLY)
Abs Immature Granulocytes: 0.58 10*3/uL — ABNORMAL HIGH (ref 0.00–0.07)
Basophils Absolute: 0.2 10*3/uL — ABNORMAL HIGH (ref 0.0–0.1)
Basophils Relative: 1 %
Eosinophils Absolute: 0.3 10*3/uL (ref 0.0–0.5)
Eosinophils Relative: 2 %
HCT: 27.9 % — ABNORMAL LOW (ref 39.0–52.0)
Hemoglobin: 9.3 g/dL — ABNORMAL LOW (ref 13.0–17.0)
Immature Granulocytes: 4 %
Lymphocytes Relative: 19 %
Lymphs Abs: 2.8 10*3/uL (ref 0.7–4.0)
MCH: 26.1 pg (ref 26.0–34.0)
MCHC: 33.3 g/dL (ref 30.0–36.0)
MCV: 78.2 fL — ABNORMAL LOW (ref 80.0–100.0)
Monocytes Absolute: 0.4 10*3/uL (ref 0.1–1.0)
Monocytes Relative: 3 %
Neutro Abs: 10.2 10*3/uL — ABNORMAL HIGH (ref 1.7–7.7)
Neutrophils Relative %: 71 %
Platelet Count: 301 10*3/uL (ref 150–400)
RBC: 3.57 MIL/uL — ABNORMAL LOW (ref 4.22–5.81)
RDW: 20 % — ABNORMAL HIGH (ref 11.5–15.5)
WBC Count: 14.5 10*3/uL — ABNORMAL HIGH (ref 4.0–10.5)
nRBC: 0 % (ref 0.0–0.2)

## 2019-08-29 MED ORDER — DENOSUMAB 60 MG/ML ~~LOC~~ SOSY
PREFILLED_SYRINGE | SUBCUTANEOUS | Status: AC
  Filled 2019-08-29: qty 1

## 2019-08-29 MED ORDER — EPOETIN ALFA-EPBX 10000 UNIT/ML IJ SOLN
20000.0000 [IU] | Freq: Once | INTRAMUSCULAR | Status: AC
  Administered 2019-08-29: 20000 [IU] via SUBCUTANEOUS

## 2019-08-30 ENCOUNTER — Telehealth: Payer: Self-pay | Admitting: Hematology

## 2019-08-30 NOTE — Telephone Encounter (Signed)
Scheduled per 7/28 los. Pt is aware of appt time and date. Noted to give pt appt calendar on next visit.

## 2019-09-07 ENCOUNTER — Inpatient Hospital Stay: Payer: Medicare Other | Attending: Hematology

## 2019-09-07 ENCOUNTER — Other Ambulatory Visit: Payer: Self-pay

## 2019-09-07 ENCOUNTER — Inpatient Hospital Stay: Payer: Medicare Other

## 2019-09-07 VITALS — BP 153/46 | HR 53 | Temp 98.1°F | Resp 18

## 2019-09-07 DIAGNOSIS — D469 Myelodysplastic syndrome, unspecified: Secondary | ICD-10-CM

## 2019-09-07 DIAGNOSIS — D631 Anemia in chronic kidney disease: Secondary | ICD-10-CM | POA: Insufficient documentation

## 2019-09-07 DIAGNOSIS — Z79899 Other long term (current) drug therapy: Secondary | ICD-10-CM | POA: Diagnosis not present

## 2019-09-07 DIAGNOSIS — D638 Anemia in other chronic diseases classified elsewhere: Secondary | ICD-10-CM

## 2019-09-07 DIAGNOSIS — I129 Hypertensive chronic kidney disease with stage 1 through stage 4 chronic kidney disease, or unspecified chronic kidney disease: Secondary | ICD-10-CM | POA: Insufficient documentation

## 2019-09-07 DIAGNOSIS — N183 Chronic kidney disease, stage 3 unspecified: Secondary | ICD-10-CM | POA: Diagnosis not present

## 2019-09-07 LAB — CBC WITH DIFFERENTIAL (CANCER CENTER ONLY)
Abs Immature Granulocytes: 0.68 10*3/uL — ABNORMAL HIGH (ref 0.00–0.07)
Basophils Absolute: 0.2 10*3/uL — ABNORMAL HIGH (ref 0.0–0.1)
Basophils Relative: 2 %
Eosinophils Absolute: 0.5 10*3/uL (ref 0.0–0.5)
Eosinophils Relative: 3 %
HCT: 28.4 % — ABNORMAL LOW (ref 39.0–52.0)
Hemoglobin: 9.6 g/dL — ABNORMAL LOW (ref 13.0–17.0)
Immature Granulocytes: 5 %
Lymphocytes Relative: 21 %
Lymphs Abs: 3.2 10*3/uL (ref 0.7–4.0)
MCH: 27.2 pg (ref 26.0–34.0)
MCHC: 33.8 g/dL (ref 30.0–36.0)
MCV: 80.5 fL (ref 80.0–100.0)
Monocytes Absolute: 0.6 10*3/uL (ref 0.1–1.0)
Monocytes Relative: 4 %
Neutro Abs: 10 10*3/uL — ABNORMAL HIGH (ref 1.7–7.7)
Neutrophils Relative %: 65 %
Platelet Count: 303 10*3/uL (ref 150–400)
RBC: 3.53 MIL/uL — ABNORMAL LOW (ref 4.22–5.81)
RDW: 20.9 % — ABNORMAL HIGH (ref 11.5–15.5)
WBC Count: 15.2 10*3/uL — ABNORMAL HIGH (ref 4.0–10.5)
nRBC: 0.1 % (ref 0.0–0.2)

## 2019-09-07 LAB — IRON AND TIBC
Iron: 122 ug/dL (ref 42–163)
Saturation Ratios: 55 % (ref 20–55)
TIBC: 224 ug/dL (ref 202–409)
UIBC: 102 ug/dL — ABNORMAL LOW (ref 117–376)

## 2019-09-07 LAB — FERRITIN: Ferritin: 90 ng/mL (ref 24–336)

## 2019-09-07 MED ORDER — EPOETIN ALFA-EPBX 40000 UNIT/ML IJ SOLN
20000.0000 [IU] | Freq: Once | INTRAMUSCULAR | Status: AC
  Administered 2019-09-07: 20000 [IU] via SUBCUTANEOUS

## 2019-09-07 MED ORDER — EPOETIN ALFA-EPBX 10000 UNIT/ML IJ SOLN
INTRAMUSCULAR | Status: AC
  Filled 2019-09-07: qty 2

## 2019-09-07 NOTE — Patient Instructions (Signed)

## 2019-09-11 ENCOUNTER — Ambulatory Visit: Payer: Medicare Other | Admitting: Podiatry

## 2019-09-17 ENCOUNTER — Other Ambulatory Visit: Payer: Self-pay

## 2019-09-17 ENCOUNTER — Inpatient Hospital Stay: Payer: Medicare Other

## 2019-09-17 VITALS — BP 141/44 | HR 59 | Temp 98.0°F | Resp 18

## 2019-09-17 DIAGNOSIS — D469 Myelodysplastic syndrome, unspecified: Secondary | ICD-10-CM

## 2019-09-17 DIAGNOSIS — N183 Chronic kidney disease, stage 3 unspecified: Secondary | ICD-10-CM | POA: Diagnosis not present

## 2019-09-17 DIAGNOSIS — I129 Hypertensive chronic kidney disease with stage 1 through stage 4 chronic kidney disease, or unspecified chronic kidney disease: Secondary | ICD-10-CM | POA: Diagnosis not present

## 2019-09-17 DIAGNOSIS — D638 Anemia in other chronic diseases classified elsewhere: Secondary | ICD-10-CM

## 2019-09-17 DIAGNOSIS — Z79899 Other long term (current) drug therapy: Secondary | ICD-10-CM | POA: Diagnosis not present

## 2019-09-17 DIAGNOSIS — D631 Anemia in chronic kidney disease: Secondary | ICD-10-CM | POA: Diagnosis not present

## 2019-09-17 LAB — CBC WITH DIFFERENTIAL (CANCER CENTER ONLY)
Abs Immature Granulocytes: 0.52 10*3/uL — ABNORMAL HIGH (ref 0.00–0.07)
Basophils Absolute: 0.2 10*3/uL — ABNORMAL HIGH (ref 0.0–0.1)
Basophils Relative: 1 %
Eosinophils Absolute: 0.5 10*3/uL (ref 0.0–0.5)
Eosinophils Relative: 3 %
HCT: 29.8 % — ABNORMAL LOW (ref 39.0–52.0)
Hemoglobin: 9.9 g/dL — ABNORMAL LOW (ref 13.0–17.0)
Immature Granulocytes: 3 %
Lymphocytes Relative: 25 %
Lymphs Abs: 4.7 10*3/uL — ABNORMAL HIGH (ref 0.7–4.0)
MCH: 26.8 pg (ref 26.0–34.0)
MCHC: 33.2 g/dL (ref 30.0–36.0)
MCV: 80.8 fL (ref 80.0–100.0)
Monocytes Absolute: 0.6 10*3/uL (ref 0.1–1.0)
Monocytes Relative: 3 %
Neutro Abs: 12.3 10*3/uL — ABNORMAL HIGH (ref 1.7–7.7)
Neutrophils Relative %: 65 %
Platelet Count: 251 10*3/uL (ref 150–400)
RBC: 3.69 MIL/uL — ABNORMAL LOW (ref 4.22–5.81)
RDW: 20.5 % — ABNORMAL HIGH (ref 11.5–15.5)
WBC Count: 18.8 10*3/uL — ABNORMAL HIGH (ref 4.0–10.5)
nRBC: 0.1 % (ref 0.0–0.2)

## 2019-09-17 MED ORDER — EPOETIN ALFA-EPBX 10000 UNIT/ML IJ SOLN
20000.0000 [IU] | Freq: Once | INTRAMUSCULAR | Status: AC
  Administered 2019-09-17: 20000 [IU] via SUBCUTANEOUS

## 2019-09-17 MED ORDER — EPOETIN ALFA-EPBX 40000 UNIT/ML IJ SOLN: 20000.0000 [IU] | Freq: Once | INTRAMUSCULAR | Status: DC

## 2019-09-17 MED ORDER — EPOETIN ALFA-EPBX 10000 UNIT/ML IJ SOLN
INTRAMUSCULAR | Status: AC
  Filled 2019-09-17: qty 2

## 2019-09-17 NOTE — Patient Instructions (Signed)

## 2019-09-18 ENCOUNTER — Ambulatory Visit (INDEPENDENT_AMBULATORY_CARE_PROVIDER_SITE_OTHER): Payer: Medicare Other | Admitting: Podiatry

## 2019-09-18 VITALS — Temp 96.7°F

## 2019-09-18 DIAGNOSIS — S90822A Blister (nonthermal), left foot, initial encounter: Secondary | ICD-10-CM

## 2019-09-18 DIAGNOSIS — L97521 Non-pressure chronic ulcer of other part of left foot limited to breakdown of skin: Secondary | ICD-10-CM

## 2019-09-18 DIAGNOSIS — L089 Local infection of the skin and subcutaneous tissue, unspecified: Secondary | ICD-10-CM | POA: Diagnosis not present

## 2019-09-18 MED ORDER — DOXYCYCLINE HYCLATE 100 MG PO TABS
100.0000 mg | ORAL_TABLET | Freq: Two times a day (BID) | ORAL | 0 refills | Status: DC
Start: 2019-09-18 — End: 2019-12-06

## 2019-09-18 NOTE — Progress Notes (Signed)
  Subjective:  Patient ID: Paul [name], male    DOB: 03/11/1940,  MRN: [id number]  Chief Complaint  Patient presents with  . Wound Check    L hallux. Pt stated, "The blood blister started forming on the [medial] side of my toe on Saturday (3-4 days ago). No pus. I had not used silvadene or band-aids for a few days because I ran out. The area looks red and swollen today. No fever/chills/N&V/odor".    79 y.o. male presents for wound care. Hx confirmed with patient.  Objective:  Physical Exam: Wound Location: left hallux Wound Measurement: 2.5x1.5 Wound Base: Mixed Granular/Fibrotic Peri-wound: Calloused, blistered Exudate: None: wound tissue dry wound with mild warmth and erythema, signs of acute infection  Assessment:   1. Ulcer of great toe, left, limited to breakdown of skin ([place])   2. Infected blister of left foot, initial encounter    Plan:  Patient was evaluated and treated and all questions answered.  Ulcer left hallux -Dressing applied consisting of silvadene -Offload ulcer with surgical shoe -Wound cleansed and debrided.  Procedure: Excisional Debridement of Wound Indication: Removal of non-viable soft tissue from the wound to promote healing.  Anesthesia: none Pre-Debridement Wound Measurements: overlying blister  Post-Debridement Wound Measurements: 2.5 cm x 1.5 cm x 0.2 cm  Type of Debridement: Sharp Excisional Tissue Removed: Non-viable soft tissue Instrumentation: 15 blade and tissue nipper Depth of Debridement: subcutaneous tissue. Technique: Sharp excisional debridement to bleeding, viable wound base.  Dressing: Dry, sterile, compression dressing. Disposition: Patient tolerated procedure well. Patient to return in 1 week for follow-up.  Return in about 3 weeks (around 10/09/2019) for Wound Care, Left.

## 2019-09-27 ENCOUNTER — Inpatient Hospital Stay: Payer: Medicare Other

## 2019-09-27 ENCOUNTER — Other Ambulatory Visit: Payer: Self-pay

## 2019-09-27 VITALS — BP 133/42 | HR 43 | Temp 98.3°F | Resp 18

## 2019-09-27 DIAGNOSIS — D469 Myelodysplastic syndrome, unspecified: Secondary | ICD-10-CM

## 2019-09-27 DIAGNOSIS — I129 Hypertensive chronic kidney disease with stage 1 through stage 4 chronic kidney disease, or unspecified chronic kidney disease: Secondary | ICD-10-CM | POA: Diagnosis not present

## 2019-09-27 DIAGNOSIS — D638 Anemia in other chronic diseases classified elsewhere: Secondary | ICD-10-CM

## 2019-09-27 DIAGNOSIS — N183 Chronic kidney disease, stage 3 unspecified: Secondary | ICD-10-CM | POA: Diagnosis not present

## 2019-09-27 DIAGNOSIS — Z79899 Other long term (current) drug therapy: Secondary | ICD-10-CM | POA: Diagnosis not present

## 2019-09-27 DIAGNOSIS — D631 Anemia in chronic kidney disease: Secondary | ICD-10-CM | POA: Diagnosis not present

## 2019-09-27 LAB — CBC WITH DIFFERENTIAL (CANCER CENTER ONLY)
Abs Immature Granulocytes: 0.41 10*3/uL — ABNORMAL HIGH (ref 0.00–0.07)
Basophils Absolute: 0.2 10*3/uL — ABNORMAL HIGH (ref 0.0–0.1)
Basophils Relative: 2 %
Eosinophils Absolute: 0.4 10*3/uL (ref 0.0–0.5)
Eosinophils Relative: 3 %
HCT: 29.6 % — ABNORMAL LOW (ref 39.0–52.0)
Hemoglobin: 9.8 g/dL — ABNORMAL LOW (ref 13.0–17.0)
Immature Granulocytes: 3 %
Lymphocytes Relative: 21 %
Lymphs Abs: 3.5 10*3/uL (ref 0.7–4.0)
MCH: 27.1 pg (ref 26.0–34.0)
MCHC: 33.1 g/dL (ref 30.0–36.0)
MCV: 81.8 fL (ref 80.0–100.0)
Monocytes Absolute: 0.5 10*3/uL (ref 0.1–1.0)
Monocytes Relative: 3 %
Neutro Abs: 11.3 10*3/uL — ABNORMAL HIGH (ref 1.7–7.7)
Neutrophils Relative %: 68 %
Platelet Count: 242 10*3/uL (ref 150–400)
RBC: 3.62 MIL/uL — ABNORMAL LOW (ref 4.22–5.81)
RDW: 20.4 % — ABNORMAL HIGH (ref 11.5–15.5)
WBC Count: 16.4 10*3/uL — ABNORMAL HIGH (ref 4.0–10.5)
nRBC: 0 % (ref 0.0–0.2)

## 2019-09-27 MED ORDER — EPOETIN ALFA-EPBX 10000 UNIT/ML IJ SOLN
INTRAMUSCULAR | Status: AC
  Filled 2019-09-27: qty 2

## 2019-09-27 MED ORDER — EPOETIN ALFA-EPBX 40000 UNIT/ML IJ SOLN
20000.0000 [IU] | Freq: Once | INTRAMUSCULAR | Status: AC
  Administered 2019-09-27: 20000 [IU] via SUBCUTANEOUS

## 2019-09-27 NOTE — Progress Notes (Signed)
Notified dr and nurse of pt low blood pressure and pulse reading. Had the OK to administer scheduled retacrit.

## 2019-09-29 ENCOUNTER — Other Ambulatory Visit: Payer: Self-pay | Admitting: Cardiovascular Disease

## 2019-10-09 ENCOUNTER — Inpatient Hospital Stay: Payer: Medicare Other

## 2019-10-09 ENCOUNTER — Inpatient Hospital Stay: Payer: Medicare Other | Attending: Hematology

## 2019-10-09 ENCOUNTER — Other Ambulatory Visit: Payer: Self-pay

## 2019-10-09 VITALS — BP 123/56 | HR 59 | Resp 18

## 2019-10-09 DIAGNOSIS — K7460 Unspecified cirrhosis of liver: Secondary | ICD-10-CM | POA: Diagnosis not present

## 2019-10-09 DIAGNOSIS — E119 Type 2 diabetes mellitus without complications: Secondary | ICD-10-CM | POA: Insufficient documentation

## 2019-10-09 DIAGNOSIS — D638 Anemia in other chronic diseases classified elsewhere: Secondary | ICD-10-CM

## 2019-10-09 DIAGNOSIS — I129 Hypertensive chronic kidney disease with stage 1 through stage 4 chronic kidney disease, or unspecified chronic kidney disease: Secondary | ICD-10-CM | POA: Insufficient documentation

## 2019-10-09 DIAGNOSIS — N185 Chronic kidney disease, stage 5: Secondary | ICD-10-CM | POA: Insufficient documentation

## 2019-10-09 DIAGNOSIS — D631 Anemia in chronic kidney disease: Secondary | ICD-10-CM | POA: Diagnosis not present

## 2019-10-09 DIAGNOSIS — Z23 Encounter for immunization: Secondary | ICD-10-CM | POA: Diagnosis not present

## 2019-10-09 DIAGNOSIS — I2510 Atherosclerotic heart disease of native coronary artery without angina pectoris: Secondary | ICD-10-CM | POA: Insufficient documentation

## 2019-10-09 DIAGNOSIS — D469 Myelodysplastic syndrome, unspecified: Secondary | ICD-10-CM

## 2019-10-09 DIAGNOSIS — M1A9XX0 Chronic gout, unspecified, without tophus (tophi): Secondary | ICD-10-CM | POA: Diagnosis not present

## 2019-10-09 DIAGNOSIS — J439 Emphysema, unspecified: Secondary | ICD-10-CM | POA: Diagnosis not present

## 2019-10-09 DIAGNOSIS — I132 Hypertensive heart and chronic kidney disease with heart failure and with stage 5 chronic kidney disease, or end stage renal disease: Secondary | ICD-10-CM | POA: Diagnosis not present

## 2019-10-09 DIAGNOSIS — G8929 Other chronic pain: Secondary | ICD-10-CM | POA: Diagnosis not present

## 2019-10-09 DIAGNOSIS — D72829 Elevated white blood cell count, unspecified: Secondary | ICD-10-CM | POA: Insufficient documentation

## 2019-10-09 DIAGNOSIS — Z79899 Other long term (current) drug therapy: Secondary | ICD-10-CM | POA: Insufficient documentation

## 2019-10-09 DIAGNOSIS — M549 Dorsalgia, unspecified: Secondary | ICD-10-CM | POA: Diagnosis not present

## 2019-10-09 DIAGNOSIS — F17210 Nicotine dependence, cigarettes, uncomplicated: Secondary | ICD-10-CM | POA: Insufficient documentation

## 2019-10-09 DIAGNOSIS — I714 Abdominal aortic aneurysm, without rupture: Secondary | ICD-10-CM | POA: Insufficient documentation

## 2019-10-09 DIAGNOSIS — E785 Hyperlipidemia, unspecified: Secondary | ICD-10-CM | POA: Diagnosis not present

## 2019-10-09 LAB — CBC WITH DIFFERENTIAL (CANCER CENTER ONLY)
Abs Immature Granulocytes: 0.46 10*3/uL — ABNORMAL HIGH (ref 0.00–0.07)
Basophils Absolute: 0.2 10*3/uL — ABNORMAL HIGH (ref 0.0–0.1)
Basophils Relative: 1 %
Eosinophils Absolute: 0.5 10*3/uL (ref 0.0–0.5)
Eosinophils Relative: 3 %
HCT: 30 % — ABNORMAL LOW (ref 39.0–52.0)
Hemoglobin: 10 g/dL — ABNORMAL LOW (ref 13.0–17.0)
Immature Granulocytes: 3 %
Lymphocytes Relative: 21 %
Lymphs Abs: 3.6 10*3/uL (ref 0.7–4.0)
MCH: 27.4 pg (ref 26.0–34.0)
MCHC: 33.3 g/dL (ref 30.0–36.0)
MCV: 82.2 fL (ref 80.0–100.0)
Monocytes Absolute: 0.4 10*3/uL (ref 0.1–1.0)
Monocytes Relative: 3 %
Neutro Abs: 11.9 10*3/uL — ABNORMAL HIGH (ref 1.7–7.7)
Neutrophils Relative %: 69 %
Platelet Count: 251 10*3/uL (ref 150–400)
RBC: 3.65 MIL/uL — ABNORMAL LOW (ref 4.22–5.81)
RDW: 20.1 % — ABNORMAL HIGH (ref 11.5–15.5)
WBC Count: 17.1 10*3/uL — ABNORMAL HIGH (ref 4.0–10.5)
nRBC: 0 % (ref 0.0–0.2)

## 2019-10-09 MED ORDER — EPOETIN ALFA-EPBX 10000 UNIT/ML IJ SOLN
INTRAMUSCULAR | Status: AC
  Filled 2019-10-09: qty 2

## 2019-10-09 MED ORDER — EPOETIN ALFA-EPBX 40000 UNIT/ML IJ SOLN
20000.0000 [IU] | Freq: Once | INTRAMUSCULAR | Status: AC
  Administered 2019-10-09: 20000 [IU] via SUBCUTANEOUS

## 2019-10-12 ENCOUNTER — Ambulatory Visit (INDEPENDENT_AMBULATORY_CARE_PROVIDER_SITE_OTHER): Payer: Medicare Other | Admitting: Podiatry

## 2019-10-12 ENCOUNTER — Other Ambulatory Visit: Payer: Self-pay

## 2019-10-12 DIAGNOSIS — L97521 Non-pressure chronic ulcer of other part of left foot limited to breakdown of skin: Secondary | ICD-10-CM

## 2019-10-19 ENCOUNTER — Other Ambulatory Visit: Payer: Self-pay

## 2019-10-19 ENCOUNTER — Inpatient Hospital Stay: Payer: Medicare Other

## 2019-10-19 VITALS — BP 130/62 | HR 42 | Resp 16

## 2019-10-19 DIAGNOSIS — N185 Chronic kidney disease, stage 5: Secondary | ICD-10-CM | POA: Diagnosis not present

## 2019-10-19 DIAGNOSIS — D469 Myelodysplastic syndrome, unspecified: Secondary | ICD-10-CM

## 2019-10-19 DIAGNOSIS — J439 Emphysema, unspecified: Secondary | ICD-10-CM | POA: Diagnosis not present

## 2019-10-19 DIAGNOSIS — K7460 Unspecified cirrhosis of liver: Secondary | ICD-10-CM | POA: Diagnosis not present

## 2019-10-19 DIAGNOSIS — G8929 Other chronic pain: Secondary | ICD-10-CM | POA: Diagnosis not present

## 2019-10-19 DIAGNOSIS — Z23 Encounter for immunization: Secondary | ICD-10-CM | POA: Diagnosis not present

## 2019-10-19 DIAGNOSIS — F17210 Nicotine dependence, cigarettes, uncomplicated: Secondary | ICD-10-CM | POA: Diagnosis not present

## 2019-10-19 DIAGNOSIS — M549 Dorsalgia, unspecified: Secondary | ICD-10-CM | POA: Diagnosis not present

## 2019-10-19 DIAGNOSIS — I129 Hypertensive chronic kidney disease with stage 1 through stage 4 chronic kidney disease, or unspecified chronic kidney disease: Secondary | ICD-10-CM | POA: Diagnosis not present

## 2019-10-19 DIAGNOSIS — E785 Hyperlipidemia, unspecified: Secondary | ICD-10-CM | POA: Diagnosis not present

## 2019-10-19 DIAGNOSIS — D638 Anemia in other chronic diseases classified elsewhere: Secondary | ICD-10-CM

## 2019-10-19 DIAGNOSIS — Z79899 Other long term (current) drug therapy: Secondary | ICD-10-CM | POA: Diagnosis not present

## 2019-10-19 DIAGNOSIS — D631 Anemia in chronic kidney disease: Secondary | ICD-10-CM | POA: Diagnosis not present

## 2019-10-19 DIAGNOSIS — D72829 Elevated white blood cell count, unspecified: Secondary | ICD-10-CM | POA: Diagnosis not present

## 2019-10-19 DIAGNOSIS — I714 Abdominal aortic aneurysm, without rupture: Secondary | ICD-10-CM | POA: Diagnosis not present

## 2019-10-19 DIAGNOSIS — E119 Type 2 diabetes mellitus without complications: Secondary | ICD-10-CM | POA: Diagnosis not present

## 2019-10-19 DIAGNOSIS — I132 Hypertensive heart and chronic kidney disease with heart failure and with stage 5 chronic kidney disease, or end stage renal disease: Secondary | ICD-10-CM | POA: Diagnosis not present

## 2019-10-19 DIAGNOSIS — M1A9XX0 Chronic gout, unspecified, without tophus (tophi): Secondary | ICD-10-CM | POA: Diagnosis not present

## 2019-10-19 DIAGNOSIS — I2510 Atherosclerotic heart disease of native coronary artery without angina pectoris: Secondary | ICD-10-CM | POA: Diagnosis not present

## 2019-10-19 LAB — CBC WITH DIFFERENTIAL (CANCER CENTER ONLY)
Abs Immature Granulocytes: 0.54 10*3/uL — ABNORMAL HIGH (ref 0.00–0.07)
Basophils Absolute: 0.2 10*3/uL — ABNORMAL HIGH (ref 0.0–0.1)
Basophils Relative: 1 %
Eosinophils Absolute: 0.6 10*3/uL — ABNORMAL HIGH (ref 0.0–0.5)
Eosinophils Relative: 3 %
HCT: 29.4 % — ABNORMAL LOW (ref 39.0–52.0)
Hemoglobin: 9.9 g/dL — ABNORMAL LOW (ref 13.0–17.0)
Immature Granulocytes: 3 %
Lymphocytes Relative: 19 %
Lymphs Abs: 3.3 10*3/uL (ref 0.7–4.0)
MCH: 27 pg (ref 26.0–34.0)
MCHC: 33.7 g/dL (ref 30.0–36.0)
MCV: 80.3 fL (ref 80.0–100.0)
Monocytes Absolute: 0.4 10*3/uL (ref 0.1–1.0)
Monocytes Relative: 2 %
Neutro Abs: 12.2 10*3/uL — ABNORMAL HIGH (ref 1.7–7.7)
Neutrophils Relative %: 72 %
Platelet Count: 222 10*3/uL (ref 150–400)
RBC: 3.66 MIL/uL — ABNORMAL LOW (ref 4.22–5.81)
RDW: 19.5 % — ABNORMAL HIGH (ref 11.5–15.5)
WBC Count: 17.2 10*3/uL — ABNORMAL HIGH (ref 4.0–10.5)
nRBC: 0 % (ref 0.0–0.2)

## 2019-10-19 MED ORDER — EPOETIN ALFA-EPBX 10000 UNIT/ML IJ SOLN
INTRAMUSCULAR | Status: AC
  Filled 2019-10-19: qty 2

## 2019-10-19 MED ORDER — EPOETIN ALFA-EPBX 40000 UNIT/ML IJ SOLN
20000.0000 [IU] | Freq: Once | INTRAMUSCULAR | Status: DC
Start: 2019-10-19 — End: 2019-10-19

## 2019-10-19 MED ORDER — EPOETIN ALFA-EPBX 10000 UNIT/ML IJ SOLN
20000.0000 [IU] | Freq: Once | INTRAMUSCULAR | Status: AC
  Administered 2019-10-19: 20000 [IU] via SUBCUTANEOUS

## 2019-10-19 MED ORDER — EPOETIN ALFA-EPBX 40000 UNIT/ML IJ SOLN: 20000.0000 [IU] | Freq: Once | INTRAMUSCULAR | Status: DC

## 2019-10-19 NOTE — Patient Instructions (Signed)

## 2019-10-26 NOTE — Progress Notes (Signed)
[place]   Telephone:(336) [phone] Fax:(336) [phone]   Clinic Follow up Note   Patient Care Team: Denita Lung, MD as PCP - General (Family Medicine) Burnell Blanks, MD as PCP - Cardiology (Cardiology)  Date of Service:  10/29/2019   CHIEF COMPLAINT: F/u ofMDS/MPN, with mildleukocytosis and moderate anemia,JAK2 V617F (-)  CURRENT THERAPY:  Retacrit monthly started 10/30/18. Will increase to every 2 weeks starting 01/29/19. Increased to weekly on 05/07/19. Decreased back to every 2 weeks from 07/04/19. Increased to every 10 days on 08/09/19  INTERVAL HISTORY:  Paul [name] is here for a follow up. He presents to the clinic alone. He is clinically stable  Fatigue is stable, no chest pain or SOB, able to walk a block slowly Does not do much at home, able to do ADLs, wife dose most house chores  No other new complains. ROS otherwise negative    MEDICAL HISTORY:  Past Medical History:  Diagnosis Date  . AAA (abdominal aortic aneurysm) ([place])   . Abnormal LFTs   . AKI (acute kidney injury) ([place])   . Anemia of chronic disease   . Arthritis    "all over" (12/04/2015)  . Atherosclerosis of native coronary artery of native heart without angina pectoris   . CAD (coronary artery disease)    with cath 09/26/08 showing severe heart disease in RCA with POBA, mild to mod diesease in circ and LAD  . Chronic back pain    back problems prior to surgery   . Constipation   . Coronary atherosclerosis of native coronary artery   . Dehydration   . Diabetes mellitus    with neuropathy  . Diarrhea   . Elevated liver enzymes   . Generalized weakness   . Gout of right hand   . Hiatal hernia   . History of gastroesophageal reflux (GERD)   . History of stomach ulcers   . Hyperlipidemia   . Hypertension   . Metabolic acidosis   . Normochromic anemia 02/23/2016  . 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 Thrombocytopenia ([place])   . Unintentional weight loss 09/27/2017    33 lbs between 4/19 & 8/19  . Vitamin B12 deficiency   . Vitamin B12 deficiency   . Vitamin D deficiency   . Weakness     SURGICAL HISTORY: Past Surgical History:  Procedure Laterality Date  . ANKLE FRACTURE SURGERY Left    "crushed"  . BACK SURGERY  X 3   "fell 4 stories in 1977"  . CARPAL TUNNEL RELEASE Left   . CORONARY ANGIOPLASTY WITH STENT PLACEMENT  09/26/2008   [name] 06/03/2010  . FRACTURE SURGERY    . LEFT HEART CATHETERIZATION WITH CORONARY ANGIOGRAM N/A 01/21/2012   Procedure: LEFT HEART CATHETERIZATION WITH CORONARY ANGIOGRAM;  Surgeon: Burnell Blanks, MD;  Location[facility] CATH LAB;  Service: Cardiovascular;  Laterality: N/A;  . LUMBAR [place]; ~ 1980  . POSTERIOR LUMBAR FUSION  ~ 1998   "3rd OR to reconstruct my back after fall in 1977"  . 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  ALLERGIES:  is allergic to allopurinol and metformin and related.  MEDICATIONS:  Current Outpatient Medications  Medication Sig Dispense Refill  . amLODipine (NORVASC) 5 MG tablet Take 1 tablet (5 mg total) by mouth daily. 90 tablet 3  . aspirin 81 MG  chewable tablet Chew 81 mg by mouth daily.    . clopidogrel (PLAVIX) 75 MG tablet TAKE 1 TABLET BY MOUTH  DAILY 90 tablet 3  . doxycycline (VIBRA-TABS) 100 MG tablet Take 1 tablet (100 mg total) by mouth 2 (two) times daily. 14 tablet 0  . ENBREL SURECLICK 50 MG/ML injection Inject into the skin once a week.    . febuxostat (ULORIC) 40 MG tablet Take 40 mg by mouth daily.    . fish oil-omega-3 fatty acids 1000 MG capsule Take 1 g by mouth 2 (two) times daily.     Marland Kitchen glucose blood (ACCU-CHEK AVIVA PLUS) test strip Test once to twice daily 200 each 4  . isosorbide mononitrate (IMDUR) 30 MG 24 hr tablet Take one tablet by mouth every morning, take two tablets by mouth every evening 270 tablet 3  . leflunomide  (ARAVA) 20 MG tablet Take 20 mg by mouth daily.    . metoprolol tartrate (LOPRESSOR) 25 MG tablet TAKE 1 TABLET BY MOUTH  TWICE DAILY 180 tablet 2  . Multiple Vitamins-Minerals (CENTRUM) tablet Take 1 tablet by mouth daily.    . nitroGLYCERIN (NITROSTAT) 0.4 MG SL tablet Place 0.4 mg under the tongue every 5 (five) minutes as needed for chest pain. For 3 doses    . predniSONE (DELTASONE) 5 MG tablet Take 5-10 mg by mouth daily.    Marland Kitchen pyridoxine (B-6) 100 MG tablet Take 100 mg by mouth daily.    . silver sulfADIAZINE (SILVADENE) 1 % cream Apply pea-sized amount to wound daily. (Patient not taking: Reported on 09/18/2019) 50 g 0  . simvastatin (ZOCOR) 20 MG tablet Take 1 tablet (20 mg total) by mouth at bedtime. 90 tablet 3   No current facility-administered medications for this visit.   Facility-Administered Medications Ordered in Other Visits  Medication Dose Route Frequency Provider Last Rate Last Admin  . epoetin alfa-epbx (RETACRIT) injection 30,000 Units  30,000 Units Subcutaneous See admin instructions Truitt Merle, MD        PHYSICAL EXAMINATION: ECOG PERFORMANCE STATUS: 2 - Symptomatic, <50% confined to bed  Vitals:   10/29/19 1359  BP: (!) 152/47  Pulse: 60  Resp: 18  Temp: 97.8 F (36.6 C)  SpO2: 99%   Filed Weights   10/29/19 1359  Weight: 186 lb 12.8 oz (84.7 kg)    GENERAL:alert, no distress and comfortable SKIN: skin color, texture, turgor are normal, no rashes or significant lesions EYES: normal, Conjunctiva are pink and non-injected, sclera clear Musculoskeletal:no cyanosis of digits and no clubbing  NEURO: alert & oriented x 3 with fluent speech, no focal motor/sensory deficits  LABORATORY DATA:  I have reviewed the data as listed CBC Latest Ref Rng & Units 10/29/2019 10/19/2019 10/09/2019  WBC 4.0 - 10.5 K/uL 16.8(H) 17.2(H) 17.1(H)  Hemoglobin 13.0 - 17.0 g/dL 9.6(L) 9.9(L) 10.0(L)  Hematocrit 39 - 52 % 28.6(L) 29.4(L) 30.0(L)  Platelets 150 - 400 K/uL 219 222  251     CMP Latest Ref Rng & Units 06/12/2019 01/17/2019 09/02/2018  Glucose 65 - 99 mg/dL 80 156(H) -  BUN 8 - 27 mg/dL 26 24 -  Creatinine 0.76 - 1.27 mg/dL 1.20 1.17 1.24  Sodium 134 - 144 mmol/L 135 134 -  Potassium 3.5 - 5.2 mmol/L 4.7 5.4(H) -  Chloride 96 - 106 mmol/L 106 103 -  CO2 20 - 29 mmol/L 22 20 -  Calcium 8.6 - 10.2 mg/dL 8.8 8.9 -  Total Protein 6.0 - 8.5 g/dL 8.0  7.4 -  Total Bilirubin 0.0 - 1.2 mg/dL 0.6 0.7 -  Alkaline Phos 39 - 117 IU/L 76 126(H) -  AST 0 - 40 IU/L 15 29 -  ALT 0 - 44 IU/L 16 18 -      RADIOGRAPHIC STUDIES: I have personally reviewed the radiological images as listed and agreed with the findings in the report. No results found.   ASSESSMENT & PLAN:  Paul [name] is a 79 y.o. male with    1. MDS/MPN, JAK2 (-), cytogenetics normal -Hehas had anemia issues since mid 2017 andHghasin 9-10 range most of time, he also has mild leukocytosis, thrombocytosisresolved now -Other contributions to his anemia include stage III chronic renal insufficiency and chronic gouty arthritis.Previous iron study and B12 level were normal. 11/4/19Bone marrow is consistent with a myeloproliferative disorder with dysplastic features. -His leukocytosis is very mild, no thrombocytosis, risk of thrombosis is not very high, no need treatment such as hydrea.He is currently on aspirin and Plavix, which also helps to prevent thrombosis. -His anemia has worsened so I started him on treatment with monthly Retacrit beginning 10/30/18. He is currently on every 10 days -Lab reviewed, hemoglobin 9.6 today, slowly trending down, I will increase his Retacrit to 30 K today and continue injection every 10 days -Follow-up in 3 months   2.Gout, RA, back pain -Had allergic reaction to Allopurinol  -Now controlled withcolchicine and Uloric -For his RA he is on Enbrel.  -For recent acute bilateral back pain, a lumbar xray was obtained on 07/04/19 and showed stable chronic  compression fractures of L2-L3 along with degenerative disc disease.  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 He currently smokes 1/2ppd.  -CT chest from 11/2017 shows mild emphysema and small rightpleural effusion,pulmonary parenchymal fibrosis, probable UIP.   -He was strongly encouraged to stop smoking to prevent further exacerbation and may need oxygen.  -chest xray on 09/02/18 for cardiac work up shows pulmonary fibrosis; he is symptomatic with dyspnea, fatiguewhich are also symptoms of anemia.  -Previously recommended pulmonologyreferralbut patientdeclined -pulmonary symptoms are stable overall   4. AAA, CAD with stent placed -followed by Dr. Doren Custard, vascular surgeon. -On Plavixand ASAwhich also helps reduce risk of thrombosis  5.Mild liver cirrhosis -h/o moderate alcohol;CT AP from 10/2017 which showed nodularity suspicious of cirrhosis.   6. H/o ofright LEDVT, provoked -Treated with 3 months of Xarelto, off now   PLAN: -Proceed with Retacrit today, and will increase injection from 20K to 30K and continue every 10 days  -f/u in 3 months    No problem-specific Assessment & Plan notes found for this encounter.   No orders of the defined types were placed in this encounter.  All questions were answered. The patient knows to call the clinic with any problems, questions or concerns. No barriers to learning was detected. The total time spent in the appointment was 20 minutes.     Truitt Merle, MD 10/29/2019   I, Joslyn Devon, am acting as scribe for Truitt Merle, MD.   I have reviewed the above documentation for accuracy and completeness, and I agree with the above.

## 2019-10-29 ENCOUNTER — Inpatient Hospital Stay: Payer: Medicare Other

## 2019-10-29 ENCOUNTER — Ambulatory Visit: Payer: Medicare Other

## 2019-10-29 ENCOUNTER — Other Ambulatory Visit: Payer: Self-pay

## 2019-10-29 ENCOUNTER — Inpatient Hospital Stay (HOSPITAL_BASED_OUTPATIENT_CLINIC_OR_DEPARTMENT_OTHER): Payer: Medicare Other | Admitting: Hematology

## 2019-10-29 ENCOUNTER — Encounter: Payer: Self-pay | Admitting: Hematology

## 2019-10-29 VITALS — BP 152/47 | HR 60 | Temp 97.8°F | Resp 18 | Ht 69.0 in | Wt 186.8 lb

## 2019-10-29 DIAGNOSIS — K7460 Unspecified cirrhosis of liver: Secondary | ICD-10-CM | POA: Diagnosis not present

## 2019-10-29 DIAGNOSIS — Z23 Encounter for immunization: Secondary | ICD-10-CM | POA: Diagnosis not present

## 2019-10-29 DIAGNOSIS — Z79899 Other long term (current) drug therapy: Secondary | ICD-10-CM | POA: Diagnosis not present

## 2019-10-29 DIAGNOSIS — C946 Myelodysplastic disease, not classified: Secondary | ICD-10-CM

## 2019-10-29 DIAGNOSIS — J439 Emphysema, unspecified: Secondary | ICD-10-CM | POA: Diagnosis not present

## 2019-10-29 DIAGNOSIS — D638 Anemia in other chronic diseases classified elsewhere: Secondary | ICD-10-CM

## 2019-10-29 DIAGNOSIS — D631 Anemia in chronic kidney disease: Secondary | ICD-10-CM | POA: Diagnosis not present

## 2019-10-29 DIAGNOSIS — E119 Type 2 diabetes mellitus without complications: Secondary | ICD-10-CM | POA: Diagnosis not present

## 2019-10-29 DIAGNOSIS — I132 Hypertensive heart and chronic kidney disease with heart failure and with stage 5 chronic kidney disease, or end stage renal disease: Secondary | ICD-10-CM | POA: Diagnosis not present

## 2019-10-29 DIAGNOSIS — M1A9XX0 Chronic gout, unspecified, without tophus (tophi): Secondary | ICD-10-CM | POA: Diagnosis not present

## 2019-10-29 DIAGNOSIS — D469 Myelodysplastic syndrome, unspecified: Secondary | ICD-10-CM | POA: Diagnosis not present

## 2019-10-29 DIAGNOSIS — G8929 Other chronic pain: Secondary | ICD-10-CM | POA: Diagnosis not present

## 2019-10-29 DIAGNOSIS — E785 Hyperlipidemia, unspecified: Secondary | ICD-10-CM | POA: Diagnosis not present

## 2019-10-29 DIAGNOSIS — I714 Abdominal aortic aneurysm, without rupture: Secondary | ICD-10-CM | POA: Diagnosis not present

## 2019-10-29 DIAGNOSIS — N185 Chronic kidney disease, stage 5: Secondary | ICD-10-CM | POA: Diagnosis not present

## 2019-10-29 DIAGNOSIS — F17210 Nicotine dependence, cigarettes, uncomplicated: Secondary | ICD-10-CM | POA: Diagnosis not present

## 2019-10-29 DIAGNOSIS — M549 Dorsalgia, unspecified: Secondary | ICD-10-CM | POA: Diagnosis not present

## 2019-10-29 DIAGNOSIS — I129 Hypertensive chronic kidney disease with stage 1 through stage 4 chronic kidney disease, or unspecified chronic kidney disease: Secondary | ICD-10-CM | POA: Diagnosis not present

## 2019-10-29 DIAGNOSIS — I2510 Atherosclerotic heart disease of native coronary artery without angina pectoris: Secondary | ICD-10-CM | POA: Diagnosis not present

## 2019-10-29 DIAGNOSIS — D72829 Elevated white blood cell count, unspecified: Secondary | ICD-10-CM | POA: Diagnosis not present

## 2019-10-29 LAB — CBC WITH DIFFERENTIAL (CANCER CENTER ONLY)
Abs Immature Granulocytes: 0.54 10*3/uL — ABNORMAL HIGH (ref 0.00–0.07)
Basophils Absolute: 0.2 10*3/uL — ABNORMAL HIGH (ref 0.0–0.1)
Basophils Relative: 1 %
Eosinophils Absolute: 0.5 10*3/uL (ref 0.0–0.5)
Eosinophils Relative: 3 %
HCT: 28.6 % — ABNORMAL LOW (ref 39.0–52.0)
Hemoglobin: 9.6 g/dL — ABNORMAL LOW (ref 13.0–17.0)
Immature Granulocytes: 3 %
Lymphocytes Relative: 20 %
Lymphs Abs: 3.3 10*3/uL (ref 0.7–4.0)
MCH: 26.9 pg (ref 26.0–34.0)
MCHC: 33.6 g/dL (ref 30.0–36.0)
MCV: 80.1 fL (ref 80.0–100.0)
Monocytes Absolute: 0.4 10*3/uL (ref 0.1–1.0)
Monocytes Relative: 2 %
Neutro Abs: 11.9 10*3/uL — ABNORMAL HIGH (ref 1.7–7.7)
Neutrophils Relative %: 71 %
Platelet Count: 219 10*3/uL (ref 150–400)
RBC: 3.57 MIL/uL — ABNORMAL LOW (ref 4.22–5.81)
RDW: 19.1 % — ABNORMAL HIGH (ref 11.5–15.5)
WBC Count: 16.8 10*3/uL — ABNORMAL HIGH (ref 4.0–10.5)
nRBC: 0 % (ref 0.0–0.2)

## 2019-10-29 MED ORDER — EPOETIN ALFA-EPBX 10000 UNIT/ML IJ SOLN
30000.0000 [IU] | INTRAMUSCULAR | Status: DC
  Administered 2019-10-29: 30000 [IU] via SUBCUTANEOUS

## 2019-10-29 MED ORDER — EPOETIN ALFA-EPBX 10000 UNIT/ML IJ SOLN
INTRAMUSCULAR | Status: AC
  Filled 2019-10-29: qty 3

## 2019-10-29 MED ORDER — INFLUENZA VAC A&B SA ADJ QUAD 0.5 ML IM PRSY
0.5000 mL | PREFILLED_SYRINGE | Freq: Once | INTRAMUSCULAR | Status: AC
  Administered 2019-10-29: 0.5 mL via INTRAMUSCULAR

## 2019-10-29 MED ORDER — INFLUENZA VAC A&B SA ADJ QUAD 0.5 ML IM PRSY
PREFILLED_SYRINGE | INTRAMUSCULAR | Status: AC
  Filled 2019-10-29: qty 0.5

## 2019-10-30 ENCOUNTER — Telehealth: Payer: Self-pay | Admitting: Hematology

## 2019-10-30 NOTE — Telephone Encounter (Signed)
Scheduled per 9/27 los. Pt is aware of appt times and date. Noted to give pt appt calendar on next visit.

## 2019-11-01 NOTE — Progress Notes (Signed)
  Subjective:  Patient ID: Paul [name], male    DOB: 26-Dec-1940,  MRN: [id number]  Chief Complaint  Patient presents with  . Foot Ulcer    Ulcer located on left great toe. Area is intact, skin is slightly brownish/ black. No pain     79 y.o. male presents for wound care. Hx confirmed with patient.  Objective:  Physical Exam: Wound Location: left hallux Wound Measurement: 2.5x1 Wound Base: Mixed Granular/Fibrotic Peri-wound: Calloused, blistered Exudate: None: wound tissue dry wound with mild warmth and erythema, signs of acute infection  Assessment:   1. Ulcer of great toe, left, limited to breakdown of skin [facility])    Plan:  Patient was evaluated and treated and all questions answered.  Ulcer left hallux -Dressing applied consisting of silvadene -Offload ulcer with surgical shoe -Wound cleansed and minimally debrided.  Return in about 6 weeks (around 11/23/2019) for Capsulitis.

## 2019-11-08 ENCOUNTER — Inpatient Hospital Stay: Payer: Medicare Other

## 2019-11-08 ENCOUNTER — Inpatient Hospital Stay: Payer: Medicare Other | Attending: Hematology

## 2019-11-08 ENCOUNTER — Other Ambulatory Visit: Payer: Self-pay

## 2019-11-08 VITALS — BP 138/64

## 2019-11-08 DIAGNOSIS — C946 Myelodysplastic disease, not classified: Secondary | ICD-10-CM

## 2019-11-08 DIAGNOSIS — I132 Hypertensive heart and chronic kidney disease with heart failure and with stage 5 chronic kidney disease, or end stage renal disease: Secondary | ICD-10-CM | POA: Diagnosis not present

## 2019-11-08 DIAGNOSIS — D469 Myelodysplastic syndrome, unspecified: Secondary | ICD-10-CM

## 2019-11-08 DIAGNOSIS — D631 Anemia in chronic kidney disease: Secondary | ICD-10-CM | POA: Insufficient documentation

## 2019-11-08 DIAGNOSIS — D638 Anemia in other chronic diseases classified elsewhere: Secondary | ICD-10-CM

## 2019-11-08 DIAGNOSIS — Z79899 Other long term (current) drug therapy: Secondary | ICD-10-CM | POA: Insufficient documentation

## 2019-11-08 DIAGNOSIS — N186 End stage renal disease: Secondary | ICD-10-CM | POA: Insufficient documentation

## 2019-11-08 LAB — CBC WITH DIFFERENTIAL (CANCER CENTER ONLY)
Abs Immature Granulocytes: 0.61 10*3/uL — ABNORMAL HIGH (ref 0.00–0.07)
Basophils Absolute: 0.2 10*3/uL — ABNORMAL HIGH (ref 0.0–0.1)
Basophils Relative: 1 %
Eosinophils Absolute: 0.5 10*3/uL (ref 0.0–0.5)
Eosinophils Relative: 3 %
HCT: 29.6 % — ABNORMAL LOW (ref 39.0–52.0)
Hemoglobin: 10 g/dL — ABNORMAL LOW (ref 13.0–17.0)
Immature Granulocytes: 3 %
Lymphocytes Relative: 24 %
Lymphs Abs: 4.4 10*3/uL — ABNORMAL HIGH (ref 0.7–4.0)
MCH: 26.9 pg (ref 26.0–34.0)
MCHC: 33.8 g/dL (ref 30.0–36.0)
MCV: 79.6 fL — ABNORMAL LOW (ref 80.0–100.0)
Monocytes Absolute: 0.4 10*3/uL (ref 0.1–1.0)
Monocytes Relative: 2 %
Neutro Abs: 12.2 10*3/uL — ABNORMAL HIGH (ref 1.7–7.7)
Neutrophils Relative %: 67 %
Platelet Count: 199 10*3/uL (ref 150–400)
RBC: 3.72 MIL/uL — ABNORMAL LOW (ref 4.22–5.81)
RDW: 18.6 % — ABNORMAL HIGH (ref 11.5–15.5)
WBC Count: 18.4 10*3/uL — ABNORMAL HIGH (ref 4.0–10.5)
nRBC: 0 % (ref 0.0–0.2)

## 2019-11-08 LAB — IRON AND TIBC
Iron: 54 ug/dL (ref 42–163)
Saturation Ratios: 27 % (ref 20–55)
TIBC: 200 ug/dL — ABNORMAL LOW (ref 202–409)
UIBC: 146 ug/dL (ref 117–376)

## 2019-11-08 LAB — FERRITIN: Ferritin: 99 ng/mL (ref 24–336)

## 2019-11-08 MED ORDER — EPOETIN ALFA-EPBX 10000 UNIT/ML IJ SOLN
INTRAMUSCULAR | Status: AC
  Filled 2019-11-08: qty 3

## 2019-11-08 MED ORDER — EPOETIN ALFA-EPBX 10000 UNIT/ML IJ SOLN
30000.0000 [IU] | INTRAMUSCULAR | Status: DC
  Administered 2019-11-08: 30000 [IU] via SUBCUTANEOUS

## 2019-11-08 NOTE — Patient Instructions (Signed)

## 2019-11-19 ENCOUNTER — Inpatient Hospital Stay: Payer: Medicare Other

## 2019-11-19 ENCOUNTER — Other Ambulatory Visit: Payer: Self-pay

## 2019-11-19 VITALS — BP 145/63 | HR 63 | Resp 18

## 2019-11-19 DIAGNOSIS — D469 Myelodysplastic syndrome, unspecified: Secondary | ICD-10-CM

## 2019-11-19 DIAGNOSIS — C946 Myelodysplastic disease, not classified: Secondary | ICD-10-CM

## 2019-11-19 DIAGNOSIS — D638 Anemia in other chronic diseases classified elsewhere: Secondary | ICD-10-CM

## 2019-11-19 DIAGNOSIS — I132 Hypertensive heart and chronic kidney disease with heart failure and with stage 5 chronic kidney disease, or end stage renal disease: Secondary | ICD-10-CM | POA: Diagnosis not present

## 2019-11-19 DIAGNOSIS — Z79899 Other long term (current) drug therapy: Secondary | ICD-10-CM | POA: Diagnosis not present

## 2019-11-19 DIAGNOSIS — D631 Anemia in chronic kidney disease: Secondary | ICD-10-CM | POA: Diagnosis not present

## 2019-11-19 DIAGNOSIS — N186 End stage renal disease: Secondary | ICD-10-CM | POA: Diagnosis not present

## 2019-11-19 LAB — CBC WITH DIFFERENTIAL (CANCER CENTER ONLY)
Abs Immature Granulocytes: 0.96 10*3/uL — ABNORMAL HIGH (ref 0.00–0.07)
Basophils Absolute: 0.2 10*3/uL — ABNORMAL HIGH (ref 0.0–0.1)
Basophils Relative: 1 %
Eosinophils Absolute: 0.5 10*3/uL (ref 0.0–0.5)
Eosinophils Relative: 3 %
HCT: 28.9 % — ABNORMAL LOW (ref 39.0–52.0)
Hemoglobin: 9.5 g/dL — ABNORMAL LOW (ref 13.0–17.0)
Immature Granulocytes: 5 %
Lymphocytes Relative: 19 %
Lymphs Abs: 3.4 10*3/uL (ref 0.7–4.0)
MCH: 26.5 pg (ref 26.0–34.0)
MCHC: 32.9 g/dL (ref 30.0–36.0)
MCV: 80.7 fL (ref 80.0–100.0)
Monocytes Absolute: 0.3 10*3/uL (ref 0.1–1.0)
Monocytes Relative: 2 %
Neutro Abs: 12.6 10*3/uL — ABNORMAL HIGH (ref 1.7–7.7)
Neutrophils Relative %: 70 %
Platelet Count: 207 10*3/uL (ref 150–400)
RBC: 3.58 MIL/uL — ABNORMAL LOW (ref 4.22–5.81)
RDW: 18.7 % — ABNORMAL HIGH (ref 11.5–15.5)
WBC Count: 18 10*3/uL — ABNORMAL HIGH (ref 4.0–10.5)
nRBC: 0 % (ref 0.0–0.2)

## 2019-11-19 MED ORDER — EPOETIN ALFA-EPBX 10000 UNIT/ML IJ SOLN
INTRAMUSCULAR | Status: AC
  Filled 2019-11-19: qty 3

## 2019-11-19 MED ORDER — EPOETIN ALFA-EPBX 10000 UNIT/ML IJ SOLN
30000.0000 [IU] | INTRAMUSCULAR | Status: DC
  Administered 2019-11-19: 30000 [IU] via SUBCUTANEOUS

## 2019-11-23 ENCOUNTER — Other Ambulatory Visit: Payer: Self-pay | Admitting: Family Medicine

## 2019-11-23 DIAGNOSIS — I2510 Atherosclerotic heart disease of native coronary artery without angina pectoris: Secondary | ICD-10-CM

## 2019-11-27 ENCOUNTER — Ambulatory Visit (INDEPENDENT_AMBULATORY_CARE_PROVIDER_SITE_OTHER): Payer: Medicare Other | Admitting: Podiatry

## 2019-11-27 ENCOUNTER — Other Ambulatory Visit: Payer: Self-pay

## 2019-11-27 DIAGNOSIS — L97521 Non-pressure chronic ulcer of other part of left foot limited to breakdown of skin: Secondary | ICD-10-CM

## 2019-11-27 NOTE — Progress Notes (Signed)
  Subjective:  Patient ID: Paul [name], male    DOB: 1940-07-11,  MRN: [id number]  Chief Complaint  Patient presents with  . Diabetic Ulcer    Pt states left 1st ulcer started bleeding 1 week ago. Denies fever/nausea/vomiting/chills. Denies pus drainage.    79 y.o. male presents for wound care. Hx confirmed with patient.  Objective:  Physical Exam: Wound Location: left hallux Wound Measurement: 2x1 post-debridement Wound Base: granular Peri-wound: Calloused, blistered Exudate: None: wound tissue dry  Assessment:   1. Ulcer of great toe, left, limited to breakdown of skin [facility])    Plan:  Patient was evaluated and treated and all questions answered.  Ulcer left hallux -Continue surgical shoe. Wearing normal shoe today -Debrided as below  Procedure: Excisional Debridement of Wound Indication: Removal of non-viable soft tissue from the wound to promote healing.  Anesthesia: none Pre-Debridement Wound Measurements: overlying HPK  Post-Debridement Wound Measurements: 2 cm x 1 cm x 0.3 cm  Type of Debridement: Sharp Excisional Tissue Removed: Non-viable soft tissue Instrumentation: 15 blade and tissue nipper Depth of Debridement: subcutaneous tissue. Technique: Sharp excisional debridement to bleeding, viable wound base.  Dressing: Dry, sterile, compression dressing. Disposition: Patient tolerated procedure well. Patient to return in 1 week for follow-up.   Return in about 1 month (around 12/28/2019) for Wound Care.

## 2019-11-29 ENCOUNTER — Inpatient Hospital Stay: Payer: Medicare Other

## 2019-11-29 ENCOUNTER — Other Ambulatory Visit: Payer: Self-pay

## 2019-11-29 ENCOUNTER — Telehealth: Payer: Self-pay

## 2019-11-29 VITALS — BP 152/54 | HR 53 | Temp 97.9°F | Resp 18

## 2019-11-29 DIAGNOSIS — A419 Sepsis, unspecified organism: Secondary | ICD-10-CM | POA: Diagnosis not present

## 2019-11-29 DIAGNOSIS — C946 Myelodysplastic disease, not classified: Secondary | ICD-10-CM

## 2019-11-29 DIAGNOSIS — J189 Pneumonia, unspecified organism: Secondary | ICD-10-CM | POA: Diagnosis not present

## 2019-11-29 DIAGNOSIS — D638 Anemia in other chronic diseases classified elsewhere: Secondary | ICD-10-CM

## 2019-11-29 DIAGNOSIS — D469 Myelodysplastic syndrome, unspecified: Secondary | ICD-10-CM

## 2019-11-29 LAB — CBC WITH DIFFERENTIAL (CANCER CENTER ONLY)
Abs Immature Granulocytes: 0.62 10*3/uL — ABNORMAL HIGH (ref 0.00–0.07)
Basophils Absolute: 0.2 10*3/uL — ABNORMAL HIGH (ref 0.0–0.1)
Basophils Relative: 1 %
Eosinophils Absolute: 0.6 10*3/uL — ABNORMAL HIGH (ref 0.0–0.5)
Eosinophils Relative: 3 %
HCT: 30.3 % — ABNORMAL LOW (ref 39.0–52.0)
Hemoglobin: 10 g/dL — ABNORMAL LOW (ref 13.0–17.0)
Immature Granulocytes: 4 %
Lymphocytes Relative: 25 %
Lymphs Abs: 4 10*3/uL (ref 0.7–4.0)
MCH: 26.8 pg (ref 26.0–34.0)
MCHC: 33 g/dL (ref 30.0–36.0)
MCV: 81.2 fL (ref 80.0–100.0)
Monocytes Absolute: 0.5 10*3/uL (ref 0.1–1.0)
Monocytes Relative: 3 %
Neutro Abs: 10.3 10*3/uL — ABNORMAL HIGH (ref 1.7–7.7)
Neutrophils Relative %: 64 %
Platelet Count: 214 10*3/uL (ref 150–400)
RBC: 3.73 MIL/uL — ABNORMAL LOW (ref 4.22–5.81)
RDW: 18.8 % — ABNORMAL HIGH (ref 11.5–15.5)
WBC Count: 16.2 10*3/uL — ABNORMAL HIGH (ref 4.0–10.5)
nRBC: 0 % (ref 0.0–0.2)

## 2019-11-29 MED ORDER — EPOETIN ALFA-EPBX 10000 UNIT/ML IJ SOLN
INTRAMUSCULAR | Status: AC
  Filled 2019-11-29: qty 3

## 2019-11-29 MED ORDER — EPOETIN ALFA-EPBX 10000 UNIT/ML IJ SOLN
30000.0000 [IU] | INTRAMUSCULAR | Status: DC
  Administered 2019-11-29: 30000 [IU] via SUBCUTANEOUS

## 2019-11-29 NOTE — Telephone Encounter (Signed)
Paul [name] left vm stating he is in severe lower back pain.  He states he is only able to sleep for a couple hours at night.  He states walking is difficulty.  This started 3 months ago he states.  Xray done in 07/2019 showed stable L2-3 compression fractures.  Reviewed with Dr. Burr Medico.  She recommends Paul [name] contact his PCP for this.  I relayed this information to him.

## 2019-12-01 ENCOUNTER — Emergency Department (HOSPITAL_COMMUNITY): Payer: Medicare Other

## 2019-12-01 ENCOUNTER — Encounter (HOSPITAL_COMMUNITY): Payer: Self-pay | Admitting: Emergency Medicine

## 2019-12-01 ENCOUNTER — Other Ambulatory Visit: Payer: Self-pay

## 2019-12-01 ENCOUNTER — Inpatient Hospital Stay (HOSPITAL_COMMUNITY)
Admission: EM | Admit: 2019-12-01 | Discharge: 2019-12-06 | DRG: 871 | Disposition: A | Discharge status: Home / Self Care | Payer: Medicare Other | Attending: Internal Medicine | Admitting: Internal Medicine

## 2019-12-01 DIAGNOSIS — Z888 Allergy status to other drugs, medicaments and biological substances status: Secondary | ICD-10-CM

## 2019-12-01 DIAGNOSIS — Z955 Presence of coronary angioplasty implant and graft: Secondary | ICD-10-CM | POA: Diagnosis not present

## 2019-12-01 DIAGNOSIS — M069 Rheumatoid arthritis, unspecified: Secondary | ICD-10-CM | POA: Diagnosis not present

## 2019-12-01 DIAGNOSIS — E872 Acidosis: Secondary | ICD-10-CM | POA: Diagnosis not present

## 2019-12-01 DIAGNOSIS — J9601 Acute respiratory failure with hypoxia: Secondary | ICD-10-CM | POA: Diagnosis not present

## 2019-12-01 DIAGNOSIS — N179 Acute kidney failure, unspecified: Secondary | ICD-10-CM | POA: Diagnosis present

## 2019-12-01 DIAGNOSIS — I5033 Acute on chronic diastolic (congestive) heart failure: Secondary | ICD-10-CM | POA: Diagnosis not present

## 2019-12-01 DIAGNOSIS — I130 Hypertensive heart and chronic kidney disease with heart failure and stage 1 through stage 4 chronic kidney disease, or unspecified chronic kidney disease: Secondary | ICD-10-CM | POA: Diagnosis present

## 2019-12-01 DIAGNOSIS — M1990 Unspecified osteoarthritis, unspecified site: Secondary | ICD-10-CM | POA: Diagnosis present

## 2019-12-01 DIAGNOSIS — Z8249 Family history of ischemic heart disease and other diseases of the circulatory system: Secondary | ICD-10-CM

## 2019-12-01 DIAGNOSIS — R6520 Severe sepsis without septic shock: Secondary | ICD-10-CM | POA: Diagnosis not present

## 2019-12-01 DIAGNOSIS — J84112 Idiopathic pulmonary fibrosis: Secondary | ICD-10-CM | POA: Diagnosis present

## 2019-12-01 DIAGNOSIS — Z79899 Other long term (current) drug therapy: Secondary | ICD-10-CM

## 2019-12-01 DIAGNOSIS — E871 Hypo-osmolality and hyponatremia: Secondary | ICD-10-CM | POA: Diagnosis present

## 2019-12-01 DIAGNOSIS — I714 Abdominal aortic aneurysm, without rupture: Secondary | ICD-10-CM | POA: Diagnosis not present

## 2019-12-01 DIAGNOSIS — M545 Low back pain, unspecified: Secondary | ICD-10-CM | POA: Diagnosis not present

## 2019-12-01 DIAGNOSIS — Z20822 Contact with and (suspected) exposure to covid-19: Secondary | ICD-10-CM | POA: Diagnosis present

## 2019-12-01 DIAGNOSIS — E1122 Type 2 diabetes mellitus with diabetic chronic kidney disease: Secondary | ICD-10-CM | POA: Diagnosis not present

## 2019-12-01 DIAGNOSIS — J189 Pneumonia, unspecified organism: Secondary | ICD-10-CM | POA: Diagnosis present

## 2019-12-01 DIAGNOSIS — A419 Sepsis, unspecified organism: Principal | ICD-10-CM | POA: Diagnosis present

## 2019-12-01 DIAGNOSIS — K5900 Constipation, unspecified: Secondary | ICD-10-CM | POA: Diagnosis present

## 2019-12-01 DIAGNOSIS — F17210 Nicotine dependence, cigarettes, uncomplicated: Secondary | ICD-10-CM | POA: Diagnosis present

## 2019-12-01 DIAGNOSIS — M109 Gout, unspecified: Secondary | ICD-10-CM | POA: Diagnosis present

## 2019-12-01 DIAGNOSIS — I2510 Atherosclerotic heart disease of native coronary artery without angina pectoris: Secondary | ICD-10-CM | POA: Diagnosis present

## 2019-12-01 DIAGNOSIS — R042 Hemoptysis: Secondary | ICD-10-CM

## 2019-12-01 DIAGNOSIS — U071 COVID-19: Secondary | ICD-10-CM | POA: Diagnosis not present

## 2019-12-01 DIAGNOSIS — Z86718 Personal history of other venous thrombosis and embolism: Secondary | ICD-10-CM | POA: Diagnosis not present

## 2019-12-01 DIAGNOSIS — Z7902 Long term (current) use of antithrombotics/antiplatelets: Secondary | ICD-10-CM

## 2019-12-01 DIAGNOSIS — N1832 Chronic kidney disease, stage 3b: Secondary | ICD-10-CM | POA: Diagnosis present

## 2019-12-01 DIAGNOSIS — Z7982 Long term (current) use of aspirin: Secondary | ICD-10-CM

## 2019-12-01 DIAGNOSIS — E785 Hyperlipidemia, unspecified: Secondary | ICD-10-CM | POA: Diagnosis present

## 2019-12-01 DIAGNOSIS — K219 Gastro-esophageal reflux disease without esophagitis: Secondary | ICD-10-CM | POA: Diagnosis not present

## 2019-12-01 DIAGNOSIS — D631 Anemia in chronic kidney disease: Secondary | ICD-10-CM | POA: Diagnosis present

## 2019-12-01 DIAGNOSIS — J90 Pleural effusion, not elsewhere classified: Secondary | ICD-10-CM | POA: Diagnosis not present

## 2019-12-01 DIAGNOSIS — R0602 Shortness of breath: Secondary | ICD-10-CM | POA: Diagnosis not present

## 2019-12-01 DIAGNOSIS — G8929 Other chronic pain: Secondary | ICD-10-CM | POA: Diagnosis present

## 2019-12-01 DIAGNOSIS — I517 Cardiomegaly: Secondary | ICD-10-CM | POA: Diagnosis not present

## 2019-12-01 DIAGNOSIS — D469 Myelodysplastic syndrome, unspecified: Secondary | ICD-10-CM | POA: Diagnosis present

## 2019-12-01 DIAGNOSIS — K7460 Unspecified cirrhosis of liver: Secondary | ICD-10-CM | POA: Diagnosis not present

## 2019-12-01 DIAGNOSIS — J159 Unspecified bacterial pneumonia: Secondary | ICD-10-CM | POA: Diagnosis present

## 2019-12-01 DIAGNOSIS — R079 Chest pain, unspecified: Secondary | ICD-10-CM | POA: Diagnosis not present

## 2019-12-01 DIAGNOSIS — R590 Localized enlarged lymph nodes: Secondary | ICD-10-CM | POA: Diagnosis present

## 2019-12-01 DIAGNOSIS — Z8711 Personal history of peptic ulcer disease: Secondary | ICD-10-CM

## 2019-12-01 DIAGNOSIS — E1140 Type 2 diabetes mellitus with diabetic neuropathy, unspecified: Secondary | ICD-10-CM | POA: Diagnosis present

## 2019-12-01 LAB — BASIC METABOLIC PANEL
Anion gap: 8 (ref 5–15)
BUN: 26 mg/dL — ABNORMAL HIGH (ref 8–23)
CO2: 18 mmol/L — ABNORMAL LOW (ref 22–32)
Calcium: 8.5 mg/dL — ABNORMAL LOW (ref 8.9–10.3)
Chloride: 105 mmol/L (ref 98–111)
Creatinine, Ser: 1.36 mg/dL — ABNORMAL HIGH (ref 0.61–1.24)
GFR, Estimated: 54 mL/min — ABNORMAL LOW (ref >60)
Glucose, Bld: 130 mg/dL — ABNORMAL HIGH (ref 70–99)
Potassium: 4.7 mmol/L (ref 3.5–5.1)
Sodium: 131 mmol/L — ABNORMAL LOW (ref 135–145)

## 2019-12-01 LAB — CBC
HCT: 34.7 % — ABNORMAL LOW (ref 39.0–52.0)
Hemoglobin: 11 g/dL — ABNORMAL LOW (ref 13.0–17.0)
MCH: 26.1 pg (ref 26.0–34.0)
MCHC: 31.7 g/dL (ref 30.0–36.0)
MCV: 82.4 fL (ref 80.0–100.0)
Platelets: 275 10*3/uL (ref 150–400)
RBC: 4.21 MIL/uL — ABNORMAL LOW (ref 4.22–5.81)
RDW: 19.6 % — ABNORMAL HIGH (ref 11.5–15.5)
WBC: 32 10*3/uL — ABNORMAL HIGH (ref 4.0–10.5)
nRBC: 0.2 % (ref 0.0–0.2)

## 2019-12-01 LAB — CBC WITH DIFFERENTIAL/PLATELET
Abs Immature Granulocytes: 2.37 10*3/uL — ABNORMAL HIGH (ref 0.00–0.07)
Basophils Absolute: 0.5 10*3/uL — ABNORMAL HIGH (ref 0.0–0.1)
Basophils Relative: 2 %
Eosinophils Absolute: 0.6 10*3/uL — ABNORMAL HIGH (ref 0.0–0.5)
Eosinophils Relative: 2 %
HCT: 34.9 % — ABNORMAL LOW (ref 39.0–52.0)
Hemoglobin: 11.2 g/dL — ABNORMAL LOW (ref 13.0–17.0)
Immature Granulocytes: 7 %
Lymphocytes Relative: 21 %
Lymphs Abs: 6.8 10*3/uL — ABNORMAL HIGH (ref 0.7–4.0)
MCH: 26.6 pg (ref 26.0–34.0)
MCHC: 32.1 g/dL (ref 30.0–36.0)
MCV: 82.9 fL (ref 80.0–100.0)
Monocytes Absolute: 0.6 10*3/uL (ref 0.1–1.0)
Monocytes Relative: 2 %
Neutro Abs: 21 10*3/uL — ABNORMAL HIGH (ref 1.7–7.7)
Neutrophils Relative %: 66 %
Platelets: 258 10*3/uL (ref 150–400)
RBC: 4.21 MIL/uL — ABNORMAL LOW (ref 4.22–5.81)
RDW: 19.5 % — ABNORMAL HIGH (ref 11.5–15.5)
WBC: 31.9 10*3/uL — ABNORMAL HIGH (ref 4.0–10.5)
nRBC: 0.2 % (ref 0.0–0.2)

## 2019-12-01 LAB — D-DIMER, QUANTITATIVE: D-Dimer, Quant: 1.19 ug/mL-FEU — ABNORMAL HIGH (ref 0.00–0.50)

## 2019-12-01 LAB — HEPATIC FUNCTION PANEL
ALT: 17 U/L (ref 0–44)
AST: 25 U/L (ref 15–41)
Albumin: 2.5 g/dL — ABNORMAL LOW (ref 3.5–5.0)
Alkaline Phosphatase: 83 U/L (ref 38–126)
Bilirubin, Direct: 0.5 mg/dL — ABNORMAL HIGH (ref 0.0–0.2)
Indirect Bilirubin: 0.8 mg/dL (ref 0.3–0.9)
Total Bilirubin: 1.3 mg/dL — ABNORMAL HIGH (ref 0.3–1.2)
Total Protein: 7.5 g/dL (ref 6.5–8.1)

## 2019-12-01 LAB — LACTIC ACID, PLASMA: Lactic Acid, Venous: 1.1 mmol/L (ref 0.5–1.9)

## 2019-12-01 LAB — PROTIME-INR
INR: 1.2 (ref 0.8–1.2)
Prothrombin Time: 15 seconds (ref 11.4–15.2)

## 2019-12-01 LAB — RESPIRATORY PANEL BY RT PCR (FLU A&B, COVID)
Influenza A by PCR: NEGATIVE
Influenza B by PCR: NEGATIVE
SARS Coronavirus 2 by RT PCR: NEGATIVE

## 2019-12-01 LAB — TROPONIN I (HIGH SENSITIVITY): Troponin I (High Sensitivity): 4 ng/L (ref <18)

## 2019-12-01 LAB — APTT: aPTT: 36 seconds (ref 24–36)

## 2019-12-01 MED ORDER — LACTATED RINGERS IV BOLUS
2200.0000 mL | Freq: Once | INTRAVENOUS | Status: AC
  Administered 2019-12-01: 2200 mL via INTRAVENOUS

## 2019-12-01 MED ORDER — LACTATED RINGERS IV SOLN: INTRAVENOUS | Status: DC

## 2019-12-01 MED ORDER — SIMVASTATIN 20 MG PO TABS
20.0000 mg | ORAL_TABLET | Freq: Every day | ORAL | Status: DC
  Administered 2019-12-02 – 2019-12-05 (×4): 20 mg via ORAL
  Filled 2019-12-01 (×4): qty 1

## 2019-12-01 MED ORDER — ACETAMINOPHEN 325 MG PO TABS
650.0000 mg | ORAL_TABLET | Freq: Four times a day (QID) | ORAL | Status: DC | PRN
  Administered 2019-12-03 – 2019-12-05 (×4): 650 mg via ORAL
  Filled 2019-12-01 (×4): qty 2

## 2019-12-01 MED ORDER — ACETAMINOPHEN 650 MG RE SUPP: 650.0000 mg | Freq: Four times a day (QID) | RECTAL | Status: DC | PRN

## 2019-12-01 MED ORDER — METOPROLOL TARTRATE 25 MG PO TABS
25.0000 mg | ORAL_TABLET | Freq: Two times a day (BID) | ORAL | Status: DC
  Administered 2019-12-02 – 2019-12-06 (×9): 25 mg via ORAL
  Filled 2019-12-01 (×9): qty 1

## 2019-12-01 MED ORDER — IOHEXOL 350 MG/ML SOLN
100.0000 mL | Freq: Once | INTRAVENOUS | Status: AC | PRN
  Administered 2019-12-01: 100 mL via INTRAVENOUS

## 2019-12-01 MED ORDER — GUAIFENESIN-DM 100-10 MG/5ML PO SYRP
5.0000 mL | ORAL_SOLUTION | ORAL | Status: DC | PRN
  Administered 2019-12-03: 5 mL via ORAL
  Filled 2019-12-01: qty 5

## 2019-12-01 MED ORDER — FEBUXOSTAT 40 MG PO TABS
40.0000 mg | ORAL_TABLET | Freq: Every day | ORAL | Status: DC
  Administered 2019-12-02 – 2019-12-06 (×5): 40 mg via ORAL
  Filled 2019-12-01 (×5): qty 1

## 2019-12-01 MED ORDER — SODIUM CHLORIDE 0.9 % IV SOLN
500.0000 mg | INTRAVENOUS | Status: AC
  Administered 2019-12-01 – 2019-12-05 (×5): 500 mg via INTRAVENOUS
  Filled 2019-12-01 (×5): qty 500

## 2019-12-01 MED ORDER — LEFLUNOMIDE 20 MG PO TABS
20.0000 mg | ORAL_TABLET | Freq: Every day | ORAL | Status: DC
  Administered 2019-12-02 – 2019-12-06 (×5): 20 mg via ORAL
  Filled 2019-12-01 (×5): qty 1

## 2019-12-01 MED ORDER — SODIUM CHLORIDE 0.9 % IV SOLN
2.0000 g | INTRAVENOUS | Status: AC
  Administered 2019-12-01 – 2019-12-05 (×5): 2 g via INTRAVENOUS
  Filled 2019-12-01 (×5): qty 20

## 2019-12-01 NOTE — H&P (Signed)
History and Physical    Paul [name] BZJ:[id number] DOB: 03/23/1940 DOA: 12/01/2019  PCP: Denita Lung, MD Patient coming from: Home  Chief Complaint: Shortness of breath  HPI: Paul [name] is a 79 y.o. 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  Patient states today while sitting in his recliner he felt short of breath and started coughing.  He was not having any shortness of breath previously.  Whenever he coughs he notices blood mixed with his sputum.  He checked his temperature several times and did not have a fever.  Denies chest pain.  He was vaccinated against Covid back in January.  Denies nausea, vomiting, abdominal pain, diarrhea, or dysuria.  ED Course: Afebrile.  Tachypneic.  Satting 88-90% on room air, placed on 2 L supplemental oxygen.  Blood pressure elevated upon arrival to the ED, subsequent readings significantly improved without administration of any antihypertensive medications.  WBC 31.9 with left shift, hemoglobin 11.2 (above baseline), hematocrit 34.9, platelet 258k.  Sodium 131, potassium 4.7, chloride 105, bicarb 18, BUN 26, creatinine 1.3 (no significant change from baseline), glucose 130.  Lactic acid 1.1.  INR 1.2.  High-sensitivity troponin negative.  EKG without acute ischemic changes.  D-dimer 1.19.  UA and urine culture pending.  Blood culture x2 pending.  SARS-CoV-2 PCR test pending.  Influenza panel pending.  Chest x-ray showing moderate severity bilateral multifocal infiltrates and small bilateral pleural effusions.  CT angiogram chest negative for large PE.  Showing findings consistent with granulomatous disease and progressive pulmonary fibrosis.  Interlobular septal thickening with bilateral groundglass airspace opacities.  Findings concerning for pulmonary edema versus atypical infectious process.  Patient was given ceftriaxone, azithromycin, and 2.2 L LR boluses per sepsis protocol.  Review of Systems:  All systems reviewed and apart from history of presenting illness, are negative.  Past Medical History:  Diagnosis Date  . AAA (abdominal aortic aneurysm) ([place])   . Abnormal LFTs   . AKI (acute kidney injury) ([place])   . Anemia of chronic disease   . Arthritis    "all over" (12/04/2015)  . Atherosclerosis of native coronary artery of native heart without angina pectoris   . CAD (coronary artery disease)    with cath 09/26/08 showing severe heart disease in RCA with POBA, mild to mod diesease in circ and LAD  . Chronic back pain    back problems prior to surgery   . Constipation   . Coronary atherosclerosis of native coronary artery   . Dehydration   . Diabetes mellitus    with neuropathy  . Diarrhea   . Elevated liver enzymes   . Generalized weakness   . Gout of right hand   . Hiatal hernia   . History of gastroesophageal reflux (GERD)   . History of stomach ulcers   . Hyperlipidemia   . Hypertension   . Metabolic acidosis   . Normochromic anemia 02/23/2016  . 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 Thrombocytopenia ([place])   . Unintentional weight loss 09/27/2017   33 lbs between 4/19 & 8/19  . Vitamin B12 deficiency   . Vitamin B12 deficiency   . Vitamin D deficiency   . Weakness     Past Surgical History:  Procedure Laterality Date  .  ANKLE FRACTURE SURGERY Left    "crushed"  . BACK SURGERY  X 3   "fell 4 stories in 1977"  . CARPAL TUNNEL RELEASE Left   . CORONARY ANGIOPLASTY WITH STENT PLACEMENT  09/26/2008   [name] 06/03/2010  . FRACTURE SURGERY    . LEFT HEART CATHETERIZATION WITH CORONARY ANGIOGRAM N/A 01/21/2012   Procedure: LEFT HEART CATHETERIZATION WITH CORONARY ANGIOGRAM;  Surgeon: Burnell Blanks,  MD;  Location: [facility] CATH LAB;  Service: Cardiovascular;  Laterality: N/A;  . LUMBAR [place]; ~ 1980  . POSTERIOR LUMBAR FUSION  ~ 1998   "3rd OR to reconstruct my back after fall in 1977"  . 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 He has a 10.00 pack-year smoking history. He has never used smokeless tobacco. He reports previous alcohol use. He reports that he does not use drugs.  Allergies  Allergen Reactions  . Allopurinol Other (See Comments)    thrombocytopenia  . Metformin And Related Diarrhea    Family History  Problem Relation Age of Onset  . Heart attack Mother   . Cancer Mother   . Heart disease Mother   . Heart attack Sister   . Heart disease Sister     Prior to Admission medications   Medication Sig Start Date End Date Taking? Authorizing Provider  amLODipine (NORVASC) 5 MG tablet Take 1 tablet (5 mg total) by mouth daily. 02/14/19   Burnell Blanks, MD  aspirin 81 MG chewable tablet Chew 81 mg by mouth daily.    [provider]  clopidogrel (PLAVIX) 75 MG tablet TAKE 1 TABLET BY MOUTH  DAILY 11/23/19   Denita Lung, MD  doxycycline (VIBRA-TABS) 100 MG tablet Take 1 tablet (100 mg total) by mouth 2 (two) times daily. 09/18/19   Evelina Bucy, DPM  ENBREL SURECLICK 50 MG/ML injection Inject into the skin once a week. 04/26/19   [provider]  febuxostat (ULORIC) 40 MG tablet Take 40 mg by mouth daily.    [provider]  fish oil-omega-3 fatty acids 1000 MG capsule Take 1 g by mouth 2 (two) times daily.     [provider]  glucose blood (ACCU-CHEK AVIVA PLUS) test strip Test once to twice daily 02/06/18   Denita Lung, MD  isosorbide mononitrate (IMDUR) 30 MG 24 hr tablet Take one tablet by mouth every morning, take two tablets by mouth every evening 02/14/19   Burnell Blanks, MD  leflunomide (ARAVA) 20 MG tablet Take 20 mg by mouth daily. 05/21/19   [provider]  metoprolol tartrate (LOPRESSOR) 25 MG tablet TAKE 1 TABLET BY MOUTH  TWICE DAILY 10/01/19   Burnell Blanks, MD  Multiple Vitamins-Minerals (CENTRUM) tablet Take 1 tablet by mouth daily.    [provider]  nitroGLYCERIN (NITROSTAT) 0.4 MG SL tablet Place 0.4 mg under the tongue every 5 (five) minutes as needed for chest pain. For 3 doses 10/12/11   Denita Lung, MD  predniSONE (DELTASONE) 5 MG tablet Take 5-10 mg by mouth daily. 05/29/19   [provider]  pyridoxine (B-6) 100 MG tablet Take 100 mg by mouth daily.    [provider]  silver sulfADIAZINE (SILVADENE) 1 % cream Apply pea-sized amount to wound daily. 03/30/19   Evelina Bucy, DPM  simvastatin (ZOCOR) 20 MG tablet Take 1 tablet (20 mg total) by mouth at bedtime. 11/23/18  Burnell Blanks, MD    Physical Exam: Vitals:   12/01/19 2154 12/01/19 2155 12/01/19 2159 12/01/19 2201  BP:      Pulse: 73 73 71 71  Resp: (!) 25 (!) 28 (!) 33 (!) 29  Temp:      TempSrc:      SpO2: (!) 89% (!) 89% (!) 88% 94%  Weight:      Height:        Physical Exam Constitutional:      General: He is not in acute distress. HENT:     Head: Normocephalic and atraumatic.  Eyes:     Extraocular Movements: Extraocular movements intact.     Conjunctiva/sclera: Conjunctivae normal.  Cardiovascular:     Rate and Rhythm: Normal rate and regular rhythm.     Pulses: Normal pulses.  Pulmonary:     Effort: Pulmonary effort is normal.     Breath sounds: Rales present. No wheezing.  Abdominal:     General: Bowel sounds are normal. There is no distension.     Palpations: Abdomen is soft.     Tenderness: There is no abdominal tenderness.  Musculoskeletal:     Cervical back: Normal range of motion and neck supple.     Right lower leg: Edema present.     Left lower leg: Edema present.     Comments: +2 to +3 pedal edema bilaterally  Skin:    General: Skin is warm and dry.  Neurological:      General: No focal deficit present.     Mental Status: He is alert and oriented to person, place, and time.     Labs on Admission: I have personally reviewed following labs and imaging studies  CBC: Recent Labs  Lab 11/29/19 1319 12/01/19 1915  WBC 16.2* 31.9*  32.0*  NEUTROABS 10.3* 21.0*  HGB 10.0* 11.2*  11.0*  HCT 30.3* 34.9*  34.7*  MCV 81.2 82.9  82.4  PLT 214 258  034   Basic Metabolic Panel: Recent Labs  Lab 12/01/19 1915  NA 131*  K 4.7  CL 105  CO2 18*  GLUCOSE 130*  BUN 26*  CREATININE 1.36*  CALCIUM 8.5*   GFR: Estimated Creatinine Clearance: 47 mL/min (A) (by C-G formula based on SCr of 1.36 mg/dL (H)). Liver Function Tests: Recent Labs  Lab 12/01/19 2019  AST 25  ALT 17  ALKPHOS 83  BILITOT 1.3*  PROT 7.5  ALBUMIN 2.5*   No results for input(s): LIPASE, AMYLASE in the last 168 hours. No results for input(s): AMMONIA in the last 168 hours. Coagulation Profile: Recent Labs  Lab 12/01/19 2019  INR 1.2   Cardiac Enzymes: No results for input(s): CKTOTAL, CKMB, CKMBINDEX, TROPONINI in the last 168 hours. BNP (last 3 results) No results for input(s): PROBNP in the last 8760 hours. HbA1C: No results for input(s): HGBA1C in the last 72 hours. CBG: No results for input(s): GLUCAP in the last 168 hours. Lipid Profile: No results for input(s): CHOL, HDL, LDLCALC, TRIG, CHOLHDL, LDLDIRECT in the last 72 hours. Thyroid Function Tests: No results for input(s): TSH, T4TOTAL, FREET4, T3FREE, THYROIDAB in the last 72 hours. Anemia Panel: No results for input(s): VITAMINB12, FOLATE, FERRITIN, TIBC, IRON, RETICCTPCT in the last 72 hours. Urine analysis:    Component Value Date/Time   COLORURINE AMBER (A) 12/04/2015 1137   APPEARANCEUR Clear 09/27/2017 1520   LABSPEC 1.013 12/04/2015 1137   PHURINE 5.5 12/04/2015 1137   GLUCOSEU Negative 09/27/2017 1520   HGBUR MODERATE (  A) 12/04/2015 1137   BILIRUBINUR Negative 09/27/2017 1520   KETONESUR  negative 09/19/2017 1017   KETONESUR NEGATIVE 12/04/2015 1137   PROTEINUR Negative 09/27/2017 1520   PROTEINUR NEGATIVE 12/04/2015 1137   UROBILINOGEN negative 11/17/2015 0911   NITRITE Negative 09/27/2017 1520   NITRITE NEGATIVE 12/04/2015 1137   LEUKOCYTESUR Trace (A) 09/27/2017 1520    Radiological Exams on Admission: DG Chest 2 View  Result Date: 12/01/2019 CLINICAL DATA:  Hemoptysis. EXAM: CHEST - 2 VIEW COMPARISON:  September 02, 2018 FINDINGS: Moderate severity multifocal infiltrates are seen involving the bilateral lung bases and mid lung fields. The mild diffusely increased interstitial lung markings are also seen. Biapical pleural thickening and small bilateral pleural effusions are noted. No pneumothorax is identified. The heart size and mediastinal contours are within normal limits. Multilevel degenerative changes seen throughout the thoracic spine. IMPRESSION: 1. Moderate severity bilateral multifocal infiltrates. 2. Small bilateral pleural effusions. Electronically Signed   By: Virgina Norfolk M.D.   On: 12/01/2019 19:24   CT Angio Chest PE W and/or Wo Contrast  Result Date: 12/01/2019 CLINICAL DATA:  Shortness of breath with back pain. EXAM: CT ANGIOGRAPHY CHEST WITH CONTRAST TECHNIQUE: Multidetector CT imaging of the chest was performed using the standard protocol during bolus administration of intravenous contrast. Multiplanar CT image reconstructions and MIPs were obtained to evaluate the vascular anatomy. CONTRAST:  19mL OMNIPAQUE IOHEXOL 350 MG/ML SOLN COMPARISON:  CT chest dated 11/03/2017. FINDINGS: Cardiovascular: Evaluation is significantly limited by respiratory motion artifact. Given this limitation, no acute pulmonary embolism was detected. The main pulmonary artery is not significantly dilated. There are atherosclerotic changes of the thoracic aorta without evidence for an aneurysm. The heart size is enlarged. There are coronary artery calcifications. Mediastinum/Nodes:  --mediastinal adenopathy is noted, likely reactive. There are several calcified mediastinal lymph nodes. --hilar adenopathy is noted, likely reactive. -- No axillary lymphadenopathy. -- No supraclavicular lymphadenopathy. -- Normal thyroid gland where visualized. -  Unremarkable esophagus. Lungs/Pleura: Again noted are findings of pulmonary fibrosis. This appears to have significantly progressed since prior study. There is interlobular septal thickening. There are ground-glass airspace opacities bilaterally most evident in the left upper lobe and in the perihilar distribution. There are small to moderate-sized bilateral pleural effusions, left worse than right. There is no pneumothorax. The lung volumes are low. Upper Abdomen: Contrast bolus timing is not optimized for evaluation of the abdominal organs. There are numerable calcifications throughout the liver and spleen. Musculoskeletal: There is an age-indeterminate fracture through the superior endplate of the D53 vertebral body, favored to be chronic. There is subtle sclerosis and height loss of the T10 vertebral body, likely chronic. The liver surface appears lobular. Review of the MIP images confirms the above findings. IMPRESSION: 1. Evaluation is significantly limited by respiratory motion artifact. Given this limitation, no acute pulmonary embolism was detected. 2. Findings consistent with pulmonary fibrosis, progressed since prior study. 3. Interlobular septal thickening with bilateral ground-glass airspace opacities is suggestive of pulmonary edema. An atypical infectious process is not excluded. 4. Cardiomegaly with coronary artery disease. 5. Mediastinal and hilar adenopathy, likely reactive. 6. Findings of prior granulomatous disease. 7. Lobular liver surface suspicious for underlying cirrhosis. Aortic Atherosclerosis (ICD10-I70.0). Electronically Signed   By: Constance Holster M.D.   On: 12/01/2019 22:01    EKG: Independently reviewed.  Sinus  rhythm, no acute ischemic changes in comparison to prior tracing from October 2020.  Assessment/Plan Principal Problem:   CAP (community acquired pneumonia) Active Problems:   Sepsis ([place])  Acute hypoxemic respiratory failure ([name])   Hemoptysis   Hyponatremia   Acute hypoxemic respiratory failure Presumed sepsis Suspected community-acquired pneumonia Possible acute on chronic diastolic CHF Oxygen saturation 88-90% on room air, improved with 2 L supplemental oxygen.  Meets sepsis criteria -2 SIRS (tachypnea, WBC 31.9 with left shift) and source of infection present.  No severe sepsis as lactic acid is within normal range.  CT showing bilateral groundglass airspace opacities concerning for pneumonia versus pulmonary edema.  Does have signs of volume overload on exam.  Last echo done 09/02/2018 showing normal LVEF of 19-14% and diastolic parameters consistent with impaired relaxation. -Covid infection felt to be less likely as patient has already been vaccinated. Continue airborne precautions for now.  SARS-CoV-2 PCR test pending.  Influenza panel pending.  Continue antibiotics for coverage of bacterial pneumonia including ceftriaxone and azithromycin.  Received fluid boluses per sepsis protocol.  Check procalcitonin level.  Check second set of lactate.  Continue to monitor WBC count.  Blood culture x2 pending.  Check BNP level, if elevated, give Lasix and repeat echocardiogram -Continue supplemental oxygen, wean as tolerated  Hemoptysis No gross hemoptysis at this time.  Hemoglobin 11.2, above baseline. -Antitussives as needed, continue to monitor H&H  CKD stage IIIb Creatinine 1.3, no significant change from baseline. -Continue to monitor renal function  Mild hyponatremia Possibly due to poor oral intake in setting of acute illness.  Sodium 131. -Received fluid boluses.  Repeat BMP.  Normal anion gap metabolic acidosis Bicarb 18, anion gap 8.  Lactic acid normal. -Received fluid  boluses.  Repeat BMP.  CAD with stent -Hold aspirin and Plavix overnight given complaints of hemoptysis.  Resume medications in the morning if there is no recurrence of hemoptysis and H&H remain stable.  Continue metoprolol and statin.  Diet controlled type 2 diabetes A1c 5.2 on 09/02/2018. -Repeat A1c  Hypertension Systolic currently in the 140s. -Continue metoprolol.  Hold amlodipine and Imdur at this time.  Hyperlipidemia -Continue Zocor  Rheumatoid arthritis -On weekly Enbrel injections (every Wednesday).  Continue leflunomide.  Gout -Continue febuxostat  DVT prophylaxis: SCDs at this time given concern for hemoptysis Code Status: Patient wishes to be full code. Family Communication: No family available at this time. Disposition Plan: Status is: Inpatient  Remains inpatient appropriate because:IV treatments appropriate due to intensity of illness or inability to take PO, Inpatient level of care appropriate due to severity of illness and New supplemental oxygen requirement   Dispo: The patient is from: Home              Anticipated d/c is to: Home              Anticipated d/c date is: 3 days              Patient currently is not medically stable to d/c.  The medical decision making on this patient was of high complexity and the patient is at high risk for clinical deterioration, therefore this is a level 3 visit.  Shela Leff MD [facility]  If 7PM-7AM, please contact night-coverage [email or web address]  12/01/2019, 11:29 PM

## 2019-12-01 NOTE — ED Triage Notes (Signed)
Pt BIB EMS, c/o increased shortness of breath, and coughing up blood x 2 days. Pt also c/o chest and back pain. Pt 88% on room air, 4L [place] applied with improvement to 94%.

## 2019-12-01 NOTE — ED Notes (Signed)
Patient transported to CT 

## 2019-12-01 NOTE — ED Provider Notes (Signed)
[facility] EMERGENCY DEPARTMENT Provider Note   CSN: [id number] Arrival date & time: 12/01/19  1841     History Chief Complaint  Patient presents with  . Shortness of Breath    Paul [name] is a 79 y.o. male w/ hx of myeloprolifetive disorder presenting to the ED with shortness of breath and cough.  He reports coughing blood and sputum x 1 day.  He's felt SOB for 2 days.  Denies fevers, chills.  He reports worsening of his chronic back pain (he's noted to have compression fractures of the L-spine).  Normally he is not on supplemental oxygen.    He reports distant hx of "blood clot in my chest 25 years ago."  He's on aspirin + plavix.    Smokes 1/2 ppd.  He received covid vaccines in Jan-Feb of this year.  No booster shot yet.  HPI     Past Medical History:  Diagnosis Date  . AAA (abdominal aortic aneurysm) ([place])   . Abnormal LFTs   . AKI (acute kidney injury) ([place])   . Anemia of chronic disease   . Arthritis    "all over" (12/04/2015)  . Atherosclerosis of native coronary artery of native heart without angina pectoris   . CAD (coronary artery disease)    with cath 09/26/08 showing severe heart disease in RCA with POBA, mild to mod diesease in circ and LAD  . Chronic back pain    back problems prior to surgery   . Constipation   . Coronary atherosclerosis of native coronary artery   . Dehydration   . Diabetes mellitus    with neuropathy  . Diarrhea   . Elevated liver enzymes   . Generalized weakness   . Gout of right hand   . Hiatal hernia   . History of gastroesophageal reflux (GERD)   . History of stomach ulcers   . Hyperlipidemia   . Hypertension   . Metabolic acidosis   . Normochromic anemia 02/23/2016  . 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 Thrombocytopenia ([place])   . Unintentional weight loss 09/27/2017   33 lbs between 4/19 & 8/19  . Vitamin B12 deficiency   . Vitamin B12 deficiency   . Vitamin D deficiency   . Weakness      Patient Active Problem List   Diagnosis Date Noted  . CAP (community acquired pneumonia) 12/01/2019  . Sepsis ([place]) 12/01/2019  . Acute hypoxemic respiratory failure ([place]) 12/01/2019  . Hemoptysis 12/01/2019  . Hyponatremia 12/01/2019  . Onychomycosis 03/13/2019  . History of amputation of great toe ([place]) 03/08/2019  . Current smoker 01/17/2019  . 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 Unintentional weight loss 09/27/2017  . 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 Hyperlipidemia associated with type 2 diabetes mellitus ([place]) 01/21/2017  . Type 2 diabetes mellitus with diabetic neuropathy, without long-term current use of insulin ([place]) 01/21/2017  . Diabetic nephropathy associated with type 2 diabetes mellitus ([place]) 01/21/2017  . 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 Thrombocytopenia due to drugs   . Allergy status to unspecified drugs, medicaments and biological substances status   . AAA (abdominal aortic aneurysm) ([name])-infrarenal 3.4 cm 12/04/2015  . Vitamin B12 deficiency 04/22/2015  . Anemia of chronic disease 04/18/2015  . Hypertension complicating diabetes ([place]) 07/22/2010  . CAD (coronary artery disease) 09/17/2008    Past Surgical History:  Procedure Laterality Date  . ANKLE FRACTURE SURGERY Left    "crushed"  . BACK SURGERY  X 3   "fell  4 stories in 1977"  . CARPAL TUNNEL RELEASE Left   . CORONARY ANGIOPLASTY WITH STENT PLACEMENT  09/26/2008   [name] 06/03/2010  . FRACTURE SURGERY    . LEFT HEART CATHETERIZATION WITH CORONARY ANGIOGRAM N/A 01/21/2012   Procedure: LEFT HEART CATHETERIZATION WITH CORONARY ANGIOGRAM;  Surgeon: Burnell Blanks, MD;  Location: [facility] CATH LAB;  Service: Cardiovascular;  Laterality: N/A;  . LUMBAR [place]; ~ 1980  . POSTERIOR LUMBAR FUSION  ~ 1998   "3rd OR to reconstruct my back after fall in 1977"  . 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 and 2nd       Family History  Problem Relation Age of  Onset  . Heart attack Mother   . Cancer Mother   . Heart disease Mother   . Heart attack Sister   . Heart disease Sister     Social History   Tobacco Use  . Smoking status: Current Every Day Smoker    Packs/day: 0.50    Years: 20.00    Pack years: 10.00    Types: Cigars    Last attempt to quit: 01/24/2013    Years since quitting: 6.8  . Smokeless tobacco: Never Used  Vaping Use  . Vaping Use: Never used  Substance Use Topics  . Alcohol use: Not Currently  . Drug use: No    Home Medications Prior to Admission medications   Medication Sig Start Date End Date Taking? Authorizing Provider  amLODipine (NORVASC) 5 MG tablet Take 1 tablet (5 mg total) by mouth daily. 02/14/19  Yes Burnell Blanks, MD  aspirin 81 MG chewable tablet Chew 81 mg by mouth daily.   Yes [provider]  clopidogrel (PLAVIX) 75 MG tablet TAKE 1 TABLET BY MOUTH  DAILY 11/23/19  Yes Denita Lung, MD  ENBREL SURECLICK 50 MG/ML injection Inject 50 mg into the skin once a week.  04/26/19  Yes [provider]  febuxostat (ULORIC) 40 MG tablet Take 40 mg by mouth daily.   Yes [provider]  fish oil-omega-3 fatty acids 1000 MG capsule Take 1 g by mouth 2 (two) times daily.    Yes [provider]  isosorbide mononitrate (IMDUR) 30 MG 24 hr tablet Take one tablet by mouth every morning, take two tablets by mouth every evening 02/14/19  Yes Burnell Blanks, MD  leflunomide (ARAVA) 20 MG tablet Take 20 mg by mouth daily. 05/21/19  Yes [provider]  metoprolol tartrate (LOPRESSOR) 25 MG tablet TAKE 1 TABLET BY MOUTH  TWICE DAILY 10/01/19  Yes Burnell Blanks, MD  Multiple Vitamins-Minerals (CENTRUM) tablet Take 1 tablet by mouth daily.   Yes [provider]  nitroGLYCERIN (NITROSTAT) 0.4 MG SL tablet Place 0.4 mg under the tongue every 5 (five) minutes as needed for chest pain. For 3 doses 10/12/11  Yes Denita Lung, MD  pyridoxine (B-6)  100 MG tablet Take 100 mg by mouth daily.   Yes [provider]  simvastatin (ZOCOR) 20 MG tablet Take 1 tablet (20 mg total) by mouth at bedtime. 11/23/18  Yes Burnell Blanks, MD  doxycycline (VIBRA-TABS) 100 MG tablet Take 1 tablet (100 mg total) by mouth 2 (two) times daily. Patient not taking: Reported on 12/01/2019 09/18/19   Evelina Bucy, DPM  glucose blood (ACCU-CHEK AVIVA PLUS) test strip Test once to twice daily 02/06/18   Denita Lung, MD  silver sulfADIAZINE (SILVADENE) 1 % cream Apply pea-sized amount to  wound daily. Patient not taking: Reported on 12/01/2019 03/30/19   Evelina Bucy, DPM    Allergies    Allopurinol and Metformin and related  Review of Systems   Review of Systems  Constitutional: Negative for chills and fever.  HENT: Negative for ear pain and sore throat.   Eyes: Negative for pain and visual disturbance.  Respiratory: Positive for cough and shortness of breath.   Cardiovascular: Negative for chest pain and palpitations.  Gastrointestinal: Negative for abdominal pain and vomiting.  Genitourinary: Negative for dysuria and hematuria.  Musculoskeletal: Negative for arthralgias and back pain.  Skin: Negative for color change and rash.  Neurological: Negative for seizures and syncope.  All other systems reviewed and are negative.   Physical Exam Updated Vital Signs BP (!) 147/80   Pulse 71   Temp 97.8 F (36.6 C) (Oral)   Resp (!) 29   Ht 5\' 10"  (1.778 m)   Wt 84.4 kg   SpO2 94%   BMI 26.69 kg/m   Physical Exam Vitals and nursing note reviewed.  Constitutional:      Appearance: He is well-developed.  HENT:     Head: Normocephalic and atraumatic.  Eyes:     Conjunctiva/sclera: Conjunctivae normal.  Cardiovascular:     Rate and Rhythm: Normal rate and regular rhythm.     Pulses: Normal pulses.  Pulmonary:     Effort: Pulmonary effort is normal. No respiratory distress.     Comments: 90% on room air Diffuse crackles,  diminished BS in lung bases Abdominal:     Palpations: Abdomen is soft.     Tenderness: There is no abdominal tenderness.  Musculoskeletal:     Cervical back: Neck supple.  Skin:    General: Skin is warm and dry.  Neurological:     General: No focal deficit present.     Mental Status: He is alert and oriented to person, place, and time.  Psychiatric:        Mood and Affect: Mood normal.        Behavior: Behavior normal.     ED Results / Procedures / Treatments   Labs (all labs ordered are listed, but only abnormal results are displayed) Labs Reviewed  BASIC METABOLIC PANEL - Abnormal; Notable for the following components:      Result Value   Sodium 131 (*)    CO2 18 (*)    Glucose, Bld 130 (*)    BUN 26 (*)    Creatinine, Ser 1.36 (*)    Calcium 8.5 (*)    GFR, Estimated 54 (*)    All other components within normal limits  CBC - Abnormal; Notable for the following components:   WBC 32.0 (*)    RBC 4.21 (*)    Hemoglobin 11.0 (*)    HCT 34.7 (*)    RDW 19.6 (*)    All other components within normal limits  D-DIMER, QUANTITATIVE (NOT AT [facility]) - Abnormal; Notable for the following components:   D-Dimer, Quant 1.19 (*)    All other components within normal limits  HEPATIC FUNCTION PANEL - Abnormal; Notable for the following components:   Albumin 2.5 (*)    Total Bilirubin 1.3 (*)    Bilirubin, Direct 0.5 (*)    All other components within normal limits  CBC WITH DIFFERENTIAL/PLATELET - Abnormal; Notable for the following components:   WBC 31.9 (*)    RBC 4.21 (*)    Hemoglobin 11.2 (*)    HCT 34.9 (*)  RDW 19.5 (*)    Neutro Abs 21.0 (*)    Lymphs Abs 6.8 (*)    Eosinophils Absolute 0.6 (*)    Basophils Absolute 0.5 (*)    Abs Immature Granulocytes 2.37 (*)    All other components within normal limits  RESPIRATORY PANEL BY RT PCR (FLU A&B, COVID)  CULTURE, BLOOD (ROUTINE X 2)  CULTURE, BLOOD (ROUTINE X 2)  URINE CULTURE  LACTIC ACID, PLASMA  PROTIME-INR   APTT  LACTIC ACID, PLASMA  URINALYSIS, ROUTINE W REFLEX MICROSCOPIC  BRAIN NATRIURETIC PEPTIDE  PROCALCITONIN  HEMOGLOBIN A1C  CBC  BASIC METABOLIC PANEL  TROPONIN I (HIGH SENSITIVITY)    EKG None  Radiology DG Chest 2 View  Result Date: 12/01/2019 CLINICAL DATA:  Hemoptysis. EXAM: CHEST - 2 VIEW COMPARISON:  September 02, 2018 FINDINGS: Moderate severity multifocal infiltrates are seen involving the bilateral lung bases and mid lung fields. The mild diffusely increased interstitial lung markings are also seen. Biapical pleural thickening and small bilateral pleural effusions are noted. No pneumothorax is identified. The heart size and mediastinal contours are within normal limits. Multilevel degenerative changes seen throughout the thoracic spine. IMPRESSION: 1. Moderate severity bilateral multifocal infiltrates. 2. Small bilateral pleural effusions. Electronically Signed   By: Virgina Norfolk M.D.   On: 12/01/2019 19:24   CT Angio Chest PE W and/or Wo Contrast  Result Date: 12/01/2019 CLINICAL DATA:  Shortness of breath with back pain. EXAM: CT ANGIOGRAPHY CHEST WITH CONTRAST TECHNIQUE: Multidetector CT imaging of the chest was performed using the standard protocol during bolus administration of intravenous contrast. Multiplanar CT image reconstructions and MIPs were obtained to evaluate the vascular anatomy. CONTRAST:  161mL OMNIPAQUE IOHEXOL 350 MG/ML SOLN COMPARISON:  CT chest dated 11/03/2017. FINDINGS: Cardiovascular: Evaluation is significantly limited by respiratory motion artifact. Given this limitation, no acute pulmonary embolism was detected. The main pulmonary artery is not significantly dilated. There are atherosclerotic changes of the thoracic aorta without evidence for an aneurysm. The heart size is enlarged. There are coronary artery calcifications. Mediastinum/Nodes: --mediastinal adenopathy is noted, likely reactive. There are several calcified mediastinal lymph nodes.  --hilar adenopathy is noted, likely reactive. -- No axillary lymphadenopathy. -- No supraclavicular lymphadenopathy. -- Normal thyroid gland where visualized. -  Unremarkable esophagus. Lungs/Pleura: Again noted are findings of pulmonary fibrosis. This appears to have significantly progressed since prior study. There is interlobular septal thickening. There are ground-glass airspace opacities bilaterally most evident in the left upper lobe and in the perihilar distribution. There are small to moderate-sized bilateral pleural effusions, left worse than right. There is no pneumothorax. The lung volumes are low. Upper Abdomen: Contrast bolus timing is not optimized for evaluation of the abdominal organs. There are numerable calcifications throughout the liver and spleen. Musculoskeletal: There is an age-indeterminate fracture through the superior endplate of the Q46 vertebral body, favored to be chronic. There is subtle sclerosis and height loss of the T10 vertebral body, likely chronic. The liver surface appears lobular. Review of the MIP images confirms the above findings. IMPRESSION: 1. Evaluation is significantly limited by respiratory motion artifact. Given this limitation, no acute pulmonary embolism was detected. 2. Findings consistent with pulmonary fibrosis, progressed since prior study. 3. Interlobular septal thickening with bilateral ground-glass airspace opacities is suggestive of pulmonary edema. An atypical infectious process is not excluded. 4. Cardiomegaly with coronary artery disease. 5. Mediastinal and hilar adenopathy, likely reactive. 6. Findings of prior granulomatous disease. 7. Lobular liver surface suspicious for underlying cirrhosis. Aortic Atherosclerosis (ICD10-I70.0). Electronically  Signed   By: Constance Holster M.D.   On: 12/01/2019 22:01    Procedures .Critical Care Performed by: Wyvonnia Dusky, MD Authorized by: Wyvonnia Dusky, MD   Critical care provider statement:     Critical care time (minutes):  45   Critical care was necessary to treat or prevent imminent or life-threatening deterioration of the following conditions:  Sepsis   Critical care was time spent personally by me on the following activities:  Discussions with consultants, evaluation of patient's response to treatment, examination of patient, ordering and performing treatments and interventions, ordering and review of laboratory studies, ordering and review of radiographic studies, pulse oximetry, re-evaluation of patient's condition, obtaining history from patient or surrogate and review of old charts   (including critical care time)  Medications Ordered in ED Medications  cefTRIAXone (ROCEPHIN) 2 g in sodium chloride 0.9 % 100 mL IVPB (0 g Intravenous Stopped 12/01/19 2121)  azithromycin (ZITHROMAX) 500 mg in sodium chloride 0.9 % 250 mL IVPB (0 mg Intravenous Stopped 12/01/19 2302)  febuxostat (ULORIC) tablet 40 mg (has no administration in time range)  leflunomide (ARAVA) tablet 20 mg (has no administration in time range)  metoprolol tartrate (LOPRESSOR) tablet 25 mg (has no administration in time range)  simvastatin (ZOCOR) tablet 20 mg (has no administration in time range)  acetaminophen (TYLENOL) tablet 650 mg (has no administration in time range)    Or  acetaminophen (TYLENOL) suppository 650 mg (has no administration in time range)  guaiFENesin-dextromethorphan (ROBITUSSIN DM) 100-10 MG/5ML syrup 5 mL (has no administration in time range)  lactated ringers bolus 2,200 mL (0 mLs Intravenous Stopped 12/02/19 0109)  iohexol (OMNIPAQUE) 350 MG/ML injection 100 mL (100 mLs Intravenous Contrast Given 12/01/19 2136)    ED Course  I have reviewed the triage vital signs and the nursing notes.  Pertinent labs & imaging results that were available during my care of the patient were reviewed by me and considered in my medical decision making (see chart for details).  This patient complains of  cough, SOB .  This involves an extensive number of treatment options, and is a complaint that carries with it a high risk of complications and morbidity.  The differential diagnosis includes PNA vs PE vs ACS vs CHF vs Covid vs other  I ordered, reviewed, and interpreted labs, which included WBC 31.9, Lactate 1.1., hgb 11.2, Trop 4, Na 131, Cr 1.36, GFR 54.  Covid/flu negative. I ordered medication IV antibiotics, IV fluids for sepsis PNA I ordered imaging studies which included CT PE study I independently visualized and interpreted imaging which showed bilateral opacities, no acute PE and the monitor tracing which showed sinus rhythm  After the interventions stated above, I reevaluated the patient and found he remained HD stable.  Plan for medical admission for sepsis PNA.   Clinical Course as of Dec 01 132  Sat Dec 01, 2019  2207 IMPRESSION: 1. Evaluation is significantly limited by respiratory motion artifact. Given this limitation, no acute pulmonary embolism was detected. 2. Findings consistent with pulmonary fibrosis, progressed since prior study. 3. Interlobular septal thickening with bilateral ground-glass airspace opacities is suggestive of pulmonary edema. An atypical infectious process is not excluded. 4. Cardiomegaly with coronary artery disease. 5. Mediastinal and hilar adenopathy, likely reactive. 6. Findings of prior granulomatous disease. 7. Lobular liver surface suspicious for underlying cirrhosis.   [MT]  2208 Will admit for sepsis PNA   [MT]    Clinical Course User Index [MT] Octaviano Glow  J, MD    Final Clinical Impression(s) / ED Diagnoses Final diagnoses:  Sepsis, due to unspecified organism, unspecified whether acute organ dysfunction present [facility])  Community acquired pneumonia, unspecified laterality    Rx / DC Orders ED Discharge Orders    None       Jae Bruck, Carola Rhine, MD 12/02/19 [phone]

## 2019-12-01 NOTE — ED Notes (Signed)
Pt O2 sats decrease to 88%, this RN place pt back on 2L [place]

## 2019-12-02 ENCOUNTER — Inpatient Hospital Stay (HOSPITAL_COMMUNITY): Payer: Medicare Other

## 2019-12-02 DIAGNOSIS — N179 Acute kidney failure, unspecified: Secondary | ICD-10-CM

## 2019-12-02 DIAGNOSIS — E871 Hypo-osmolality and hyponatremia: Secondary | ICD-10-CM

## 2019-12-02 DIAGNOSIS — R6520 Severe sepsis without septic shock: Secondary | ICD-10-CM

## 2019-12-02 DIAGNOSIS — I5033 Acute on chronic diastolic (congestive) heart failure: Secondary | ICD-10-CM

## 2019-12-02 DIAGNOSIS — A419 Sepsis, unspecified organism: Principal | ICD-10-CM

## 2019-12-02 LAB — URINALYSIS, ROUTINE W REFLEX MICROSCOPIC
Bacteria, UA: NONE SEEN
Bilirubin Urine: NEGATIVE
Glucose, UA: NEGATIVE mg/dL
Ketones, ur: NEGATIVE mg/dL
Leukocytes,Ua: NEGATIVE
Nitrite: NEGATIVE
Protein, ur: 30 mg/dL — AB
Specific Gravity, Urine: 1.024 (ref 1.005–1.030)
pH: 5 (ref 5.0–8.0)

## 2019-12-02 LAB — ECHOCARDIOGRAM COMPLETE
AR max vel: 1.54 cm2
AV Area VTI: 1.52 cm2
AV Area mean vel: 1.54 cm2
AV Mean grad: 11 mmHg
AV Peak grad: 21.2 mmHg
Ao pk vel: 2.3 m/s
Area-P 1/2: 4.74 cm2
Height: 70 in
P 1/2 time: 221 msec
S' Lateral: 3.3 cm
Weight: 2976 oz

## 2019-12-02 LAB — CBC
HCT: 29.2 % — ABNORMAL LOW (ref 39.0–52.0)
Hemoglobin: 9.4 g/dL — ABNORMAL LOW (ref 13.0–17.0)
MCH: 26.7 pg (ref 26.0–34.0)
MCHC: 32.2 g/dL (ref 30.0–36.0)
MCV: 83 fL (ref 80.0–100.0)
Platelets: 222 10*3/uL (ref 150–400)
RBC: 3.52 MIL/uL — ABNORMAL LOW (ref 4.22–5.81)
RDW: 19.5 % — ABNORMAL HIGH (ref 11.5–15.5)
WBC: 24.4 10*3/uL — ABNORMAL HIGH (ref 4.0–10.5)
nRBC: 0 % (ref 0.0–0.2)

## 2019-12-02 LAB — HEMOGLOBIN A1C
Hgb A1c MFr Bld: 4.7 % — ABNORMAL LOW (ref 4.8–5.6)
Mean Plasma Glucose: 88.19 mg/dL

## 2019-12-02 LAB — COMPREHENSIVE METABOLIC PANEL
ALT: 17 U/L (ref 0–44)
AST: 19 U/L (ref 15–41)
Albumin: 2.6 g/dL — ABNORMAL LOW (ref 3.5–5.0)
Alkaline Phosphatase: 74 U/L (ref 38–126)
Anion gap: 5 (ref 5–15)
BUN: 25 mg/dL — ABNORMAL HIGH (ref 8–23)
CO2: 21 mmol/L — ABNORMAL LOW (ref 22–32)
Calcium: 8.5 mg/dL — ABNORMAL LOW (ref 8.9–10.3)
Chloride: 107 mmol/L (ref 98–111)
Creatinine, Ser: 1.26 mg/dL — ABNORMAL HIGH (ref 0.61–1.24)
GFR, Estimated: 59 mL/min — ABNORMAL LOW (ref >60)
Glucose, Bld: 138 mg/dL — ABNORMAL HIGH (ref 70–99)
Potassium: 4.6 mmol/L (ref 3.5–5.1)
Sodium: 133 mmol/L — ABNORMAL LOW (ref 135–145)
Total Bilirubin: 0.9 mg/dL (ref 0.3–1.2)
Total Protein: 7.5 g/dL (ref 6.5–8.1)

## 2019-12-02 LAB — TROPONIN I (HIGH SENSITIVITY)
Troponin I (High Sensitivity): 23 ng/L — ABNORMAL HIGH (ref <18)
Troponin I (High Sensitivity): 20 ng/L — ABNORMAL HIGH (ref <18)

## 2019-12-02 LAB — HEPATITIS PANEL, ACUTE
HCV Ab: NONREACTIVE
Hep A IgM: NONREACTIVE
Hep B C IgM: NONREACTIVE
Hepatitis B Surface Ag: NONREACTIVE

## 2019-12-02 LAB — LIPID PANEL
Cholesterol: 70 mg/dL (ref 0–200)
HDL: 25 mg/dL — ABNORMAL LOW (ref >40)
LDL Cholesterol: 35 mg/dL (ref 0–99)
Total CHOL/HDL Ratio: 2.8 RATIO
Triglycerides: 51 mg/dL (ref <150)
VLDL: 10 mg/dL (ref 0–40)

## 2019-12-02 LAB — BRAIN NATRIURETIC PEPTIDE: B Natriuretic Peptide: 610.7 pg/mL — ABNORMAL HIGH (ref 0.0–100.0)

## 2019-12-02 LAB — LACTIC ACID, PLASMA: Lactic Acid, Venous: 1.2 mmol/L (ref 0.5–1.9)

## 2019-12-02 LAB — PROCALCITONIN: Procalcitonin: 0.18 ng/mL

## 2019-12-02 MED ORDER — ISOSORBIDE MONONITRATE ER 60 MG PO TB24
60.0000 mg | ORAL_TABLET | Freq: Every day | ORAL | Status: DC
  Administered 2019-12-02 – 2019-12-05 (×4): 60 mg via ORAL
  Filled 2019-12-02 (×4): qty 1

## 2019-12-02 MED ORDER — NITROGLYCERIN 0.4 MG SL SUBL: 0.4000 mg | SUBLINGUAL_TABLET | SUBLINGUAL | Status: DC | PRN

## 2019-12-02 MED ORDER — ASPIRIN 81 MG PO CHEW: 81.0000 mg | CHEWABLE_TABLET | Freq: Every day | ORAL | Status: DC

## 2019-12-02 MED ORDER — AMLODIPINE BESYLATE 5 MG PO TABS
5.0000 mg | ORAL_TABLET | Freq: Every day | ORAL | Status: DC
  Administered 2019-12-02 – 2019-12-06 (×5): 5 mg via ORAL
  Filled 2019-12-02 (×5): qty 1

## 2019-12-02 MED ORDER — ISOSORBIDE MONONITRATE ER 30 MG PO TB24
30.0000 mg | ORAL_TABLET | Freq: Every day | ORAL | Status: DC
  Administered 2019-12-02 – 2019-12-06 (×5): 30 mg via ORAL
  Filled 2019-12-02 (×5): qty 1

## 2019-12-02 MED ORDER — FUROSEMIDE 10 MG/ML IJ SOLN
40.0000 mg | Freq: Once | INTRAMUSCULAR | Status: AC
  Administered 2019-12-02: 40 mg via INTRAVENOUS
  Filled 2019-12-02: qty 4

## 2019-12-02 NOTE — ED Notes (Signed)
This RN notified MD to see if pt can get any meds to bring BP down

## 2019-12-02 NOTE — Plan of Care (Signed)
  Problem: Education: Goal: Knowledge of General Education information will improve Description: Including pain rating scale, medication(s)/side effects and non-pharmacologic comfort measures Outcome: Completed/Met

## 2019-12-02 NOTE — ED Notes (Signed)
Pt removed all cardiac leads and pulse ox

## 2019-12-02 NOTE — Sepsis Progress Note (Signed)
Sepsis bundle complete. Vital signs stable.

## 2019-12-02 NOTE — Progress Notes (Signed)
PROGRESS NOTE    Paul [name]  UUV:[id number] DOB: 1940/12/05 DOA: 12/01/2019 PCP: Denita Lung, MD   Brief Narrative:  Paul [name] is a 79 y.o. 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  ED course: Patient afebrile, tachypneic, hypoxic with oxygen saturation 88 to 90% on room air placed on 2 L supplemental oxygen, blood pressure elevated upon arrival.  Subsequent reading significantly improved without administration of any antihypertensive medication.  WBC 31.9 with left shift, H&H 11.2/34.9, platelet: 258, sodium 131, potassium: 4.7 bicarb: 18, lactic acid: 1.1, INR: 1.2, troponin: Negative.  EKG without acute ischemic changes.  D-dimer: 1.19, UA and urine culture, blood culture: Pending, COVID-19 negative.  Influenza panel: Negative.  Chest x-ray shows moderate severity bilateral multifocal infiltrate and small bilateral pleural effusion.  CTA chest came back negative for PE.  Consistent with granulomatous disease and progressive pulmonary fibrosis.  Interlobular septal thickening with bilateral groundglass airspace opacities.  Findings concerning for pulmonary edema versus atypical infectious process.  Patient was given Rocephin and azithromycin and IV fluid as per sepsis protocol in the ED and admitted for further management of sepsis and acute hypoxemic respiratory failure likely secondary to pneumonia versus fluid overload.  Assessment & Plan:  Acute hypoxemic respiratory failure: In the setting of pulmonary fibrosis, sepsis  2/2 CAP  acute on chronic diastolic CHF: -Patient tachypneic, WBC more than 30,000, hypoxic upon arrival.  Meet sepsis criteria -Afebrile with normal lactic  acid  -Reviewed chest x-ray and CTA chest, COVID-19 negative -Reviewed previous echo done 09/02/2018 showing normal LVEF of 47-42% and diastolic parameters consistent with impaired relaxation.  Severe aortic annular calcification -Continue antibiotics for coverage of bacterial pneumonia including ceftriaxone and azithromycin.  -BNP elevated from baseline-610 -Repeat transthoracic echo -We will try to wean off of oxygen as tolerated -Strict INO's and daily weight. -Trial of Lasix 40 mg IV once as patient has crackles on the exams  Hemoptysis: -Multifactorial-CAP versus  Aortic valve disease -No gross hemoptysis at this time.  Hemoglobin 11.2, above baseline. -Antitussives as needed, continue to monitor H&H.  Hold aspirin and Plavix for now.  AKI on CKD stage IIIb: Creatinine: 1.36 at admission.  Improved to 1.26.  GFR: Improved from 54-59 -Continue to monitor renal function  Mild hyponatremia: Improving Possibly due to poor oral intake in setting of acute illness.  Sodium 131--> 133. -Received fluid boluses.  Repeat BMP.  Normal anion gap metabolic acidosis Bicarb 59--DGLOVFIE to 21, anion gap 8.  Lactic acid normal. -Received fluid boluses.    CAD with stent: Stable.  No ACS symptoms. -Aspirin and Plavix was hold due to hemoptysis.   -No further episodes of hemoptysis.  Continue to monitor H&H.  Continue  statin and metoprolol.  Continue to hold hold aspirin and Plavix for now.    Liver cirrhosis: -Noted on CTA chest.  -PT/INR: WNL, liver enzymes: WNL.  Mildly elevated total bilirubin: 1.3.   -Check acute hepatitis panel, lipid panel  Diet controlled type 2 diabetes -A1c 4.7%.  Hypertension: Elevated: -Continue home meds metoprolol, amlodipine and Imdur.  Monitor blood pressure closely.  Hydralazine as needed for blood pressure more than 160/100.  Hyperlipidemia -Continue Zocor  Rheumatoid arthritis -On weekly Enbrel injections (every Wednesday).  Continue  leflunomide.  MDS/MPN/anemia of chronic disease: -Followed by oncology outpatient -Gets epoetin injections every 10 days  Gout -Continue febuxostat  DVT prophylaxis: SCD, no chemical prophylaxis due to hemoptysis. Code Status: Full code Family Communication:None present at bedside.  Plan of care discussed with patient in length and he verbalized understanding and agreed with it. Disposition Plan: Home Consultants:   None  Procedures:   CT chest  Echo  Antimicrobials:   Rocephin  Azithromycin  Status is: Inpatient   Dispo: The patient is from: Home              Anticipated d/c is to: Home              Anticipated d/c date is: 12/04/2019              Patient currently not medically stable for the discharge.   Subjective: Patient seen and examined.  Tells me that he feels the same, no improvement in his symptoms.  Continues to have cough, shortness of breath and leg swelling.  Denies current chest pain, fever, chills, abdominal pain, hematemesis or melena.  Objective: Vitals:   12/01/19 2201 12/02/19 0054 12/02/19 0055 12/02/19 0550  BP:  (!) 116/104 (!) 153/104 135/63  Pulse: 71 70 68 74  Resp: (!) 29 19 (!) 26 20  Temp:    97.6 F (36.4 C)  TempSrc:    Oral  SpO2: 94% 91% 92% 99%  Weight:      Height:        Intake/Output Summary (Last 24 hours) at 12/02/2019 0921 Last data filed at 12/02/2019 0109 Gross per 24 hour  Intake 2850 ml  Output --  Net 2850 ml   Filed Weights   12/01/19 1926  Weight: 84.4 kg    Examination:  General exam: Appears calm and comfortable, on 2 L oxygen via nasal cannula, communicating well Respiratory system: Coarse breath sounds noted bilaterally.  No wheezing. Cardiovascular system: S1 & S2 heard, RRR. No JVD, murmurs, rubs, gallops or clicks.  1+ bilateral pitting edema positive Gastrointestinal system: Abdomen is nondistended, soft and nontender. No organomegaly or masses felt. Normal bowel sounds heard. Central  nervous system: Alert and oriented. No focal neurological deficits. Extremities: Symmetric 5 x 5 power. Skin: No rashes, lesions or ulcers Psychiatry: Judgement and insight appear normal. Mood & affect appropriate.    Data Reviewed: I have personally reviewed following labs and imaging studies  CBC: Recent Labs  Lab 11/29/19 1319 12/01/19 1915  WBC 16.2* 31.9*  32.0*  NEUTROABS 10.3* 21.0*  HGB 10.0* 11.2*  11.0*  HCT 30.3* 34.9*  34.7*  MCV 81.2 82.9  82.4  PLT 214 258  627   Basic Metabolic Panel: Recent Labs  Lab 12/01/19 1915  NA 131*  K 4.7  CL 105  CO2 18*  GLUCOSE 130*  BUN 26*  CREATININE 1.36*  CALCIUM 8.5*   GFR: Estimated Creatinine Clearance: 47 mL/min (A) (by C-G formula based on SCr of 1.36 mg/dL (H)). Liver Function Tests: Recent Labs  Lab 12/01/19 2019  AST 25  ALT 17  ALKPHOS 83  BILITOT 1.3*  PROT 7.5  ALBUMIN 2.5*   No results for input(s): LIPASE, AMYLASE in the last 168 hours. No results for input(s): AMMONIA in the last 168 hours. Coagulation Profile: Recent Labs  Lab 12/01/19 2019  INR 1.2   Cardiac Enzymes: No results for input(s): CKTOTAL, CKMB, CKMBINDEX, TROPONINI in the last 168 hours. BNP (last 3 results) No results for input(s):  PROBNP in the last 8760 hours. HbA1C: No results for input(s): HGBA1C in the last 72 hours. CBG: No results for input(s): GLUCAP in the last 168 hours. Lipid Profile: No results for input(s): CHOL, HDL, LDLCALC, TRIG, CHOLHDL, LDLDIRECT in the last 72 hours. Thyroid Function Tests: No results for input(s): TSH, T4TOTAL, FREET4, T3FREE, THYROIDAB in the last 72 hours. Anemia Panel: No results for input(s): VITAMINB12, FOLATE, FERRITIN, TIBC, IRON, RETICCTPCT in the last 72 hours. Sepsis Labs: Recent Labs  Lab 12/01/19 2019  LATICACIDVEN 1.1    Recent Results (from the past 240 hour(s))  Respiratory Panel by RT PCR (Flu A&B, Covid) - Nasopharyngeal Swab     Status: None   Collection  Time: 12/01/19  9:32 PM   Specimen: Nasopharyngeal Swab  Result Value Ref Range Status   SARS Coronavirus 2 by RT PCR NEGATIVE NEGATIVE Final    Comment: (NOTE) SARS-CoV-2 target nucleic acids are NOT DETECTED.  The SARS-CoV-2 RNA is generally detectable in upper respiratoy specimens during the acute phase of infection. The lowest concentration of SARS-CoV-2 viral copies this assay can detect is 131 copies/mL. A negative result does not preclude SARS-Cov-2 infection and should not be used as the sole basis for treatment or other patient management decisions. A negative result may occur with  improper specimen collection/handling, submission of specimen other than nasopharyngeal swab, presence of viral mutation(s) within the areas targeted by this assay, and inadequate number of viral copies (<131 copies/mL). A negative result must be combined with clinical observations, patient history, and epidemiological information. The expected result is Negative.  Fact Sheet for Patients:  PinkCheek.be  Fact Sheet for Healthcare Providers:  GravelBags.it  This test is no t yet approved or cleared by the [place] FDA and  has been authorized for detection and/or diagnosis of SARS-CoV-2 by FDA under an Emergency Use Authorization (EUA). This EUA will remain  in effect (meaning this test can be used) for the duration of the COVID-19 declaration under Section 564(b)(1) of the Act, 21 U.S.C. section 360bbb-3(b)(1), unless the authorization is terminated or revoked sooner.     Influenza A by PCR NEGATIVE NEGATIVE Final   Influenza B by PCR NEGATIVE NEGATIVE Final    Comment: (NOTE) The Xpert Xpress SARS-CoV-2/FLU/RSV assay is intended as an aid in  the diagnosis of influenza from Nasopharyngeal swab specimens and  should not be used as a sole basis for treatment. Nasal washings and  aspirates are unacceptable for Xpert Xpress  SARS-CoV-2/FLU/RSV  testing.  Fact Sheet for Patients: PinkCheek.be  Fact Sheet for Healthcare Providers: GravelBags.it  This test is not yet approved or cleared by the [place] FDA and  has been authorized for detection and/or diagnosis of SARS-CoV-2 by  FDA under an Emergency Use Authorization (EUA). This EUA will remain  in effect (meaning this test can be used) for the duration of the  Covid-19 declaration under Section 564(b)(1) of the Act, 21  U.S.C. section 360bbb-3(b)(1), unless the authorization is  terminated or revoked. Performed at [facility], [address], [place], [place] [address]       Radiology Studies: DG Chest 2 View  Result Date: 12/01/2019 CLINICAL DATA:  Hemoptysis. EXAM: CHEST - 2 VIEW COMPARISON:  September 02, 2018 FINDINGS: Moderate severity multifocal infiltrates are seen involving the bilateral lung bases and mid lung fields. The mild diffusely increased interstitial lung markings are also seen. Biapical pleural thickening and small bilateral pleural effusions are noted. No pneumothorax is identified. The  heart size and mediastinal contours are within normal limits. Multilevel degenerative changes seen throughout the thoracic spine. IMPRESSION: 1. Moderate severity bilateral multifocal infiltrates. 2. Small bilateral pleural effusions. Electronically Signed   By: Virgina Norfolk M.D.   On: 12/01/2019 19:24   CT Angio Chest PE W and/or Wo Contrast  Result Date: 12/01/2019 CLINICAL DATA:  Shortness of breath with back pain. EXAM: CT ANGIOGRAPHY CHEST WITH CONTRAST TECHNIQUE: Multidetector CT imaging of the chest was performed using the standard protocol during bolus administration of intravenous contrast. Multiplanar CT image reconstructions and MIPs were obtained to evaluate the vascular anatomy. CONTRAST:  149mL OMNIPAQUE IOHEXOL 350 MG/ML SOLN COMPARISON:  CT chest dated 11/03/2017.  FINDINGS: Cardiovascular: Evaluation is significantly limited by respiratory motion artifact. Given this limitation, no acute pulmonary embolism was detected. The main pulmonary artery is not significantly dilated. There are atherosclerotic changes of the thoracic aorta without evidence for an aneurysm. The heart size is enlarged. There are coronary artery calcifications. Mediastinum/Nodes: --mediastinal adenopathy is noted, likely reactive. There are several calcified mediastinal lymph nodes. --hilar adenopathy is noted, likely reactive. -- No axillary lymphadenopathy. -- No supraclavicular lymphadenopathy. -- Normal thyroid gland where visualized. -  Unremarkable esophagus. Lungs/Pleura: Again noted are findings of pulmonary fibrosis. This appears to have significantly progressed since prior study. There is interlobular septal thickening. There are ground-glass airspace opacities bilaterally most evident in the left upper lobe and in the perihilar distribution. There are small to moderate-sized bilateral pleural effusions, left worse than right. There is no pneumothorax. The lung volumes are low. Upper Abdomen: Contrast bolus timing is not optimized for evaluation of the abdominal organs. There are numerable calcifications throughout the liver and spleen. Musculoskeletal: There is an age-indeterminate fracture through the superior endplate of the O13 vertebral body, favored to be chronic. There is subtle sclerosis and height loss of the T10 vertebral body, likely chronic. The liver surface appears lobular. Review of the MIP images confirms the above findings. IMPRESSION: 1. Evaluation is significantly limited by respiratory motion artifact. Given this limitation, no acute pulmonary embolism was detected. 2. Findings consistent with pulmonary fibrosis, progressed since prior study. 3. Interlobular septal thickening with bilateral ground-glass airspace opacities is suggestive of pulmonary edema. An atypical  infectious process is not excluded. 4. Cardiomegaly with coronary artery disease. 5. Mediastinal and hilar adenopathy, likely reactive. 6. Findings of prior granulomatous disease. 7. Lobular liver surface suspicious for underlying cirrhosis. Aortic Atherosclerosis (ICD10-I70.0). Electronically Signed   By: Constance Holster M.D.   On: 12/01/2019 22:01    Scheduled Meds: . febuxostat  40 mg Oral Daily  . leflunomide  20 mg Oral Daily  . metoprolol tartrate  25 mg Oral BID  . simvastatin  20 mg Oral QHS   Continuous Infusions: . azithromycin Stopped (12/01/19 2302)  . cefTRIAXone (ROCEPHIN)  IV Stopped (12/01/19 2121)     LOS: 1 day   Time spent: 40 minutes  Jalyne Brodzinski Loann Quill, MD [facility]  If 7PM-7AM, please contact night-coverage [email or web address] 12/02/2019, 9:21 AM

## 2019-12-02 NOTE — ED Notes (Signed)
Tele  Breakfast Ordered 

## 2019-12-02 NOTE — Progress Notes (Signed)
*  PRELIMINARY RESULTS* Echocardiogram 2D Echocardiogram has been performed.  Paul [name] 12/02/2019, 11:57 AM

## 2019-12-03 DIAGNOSIS — R042 Hemoptysis: Secondary | ICD-10-CM

## 2019-12-03 DIAGNOSIS — J9601 Acute respiratory failure with hypoxia: Secondary | ICD-10-CM

## 2019-12-03 DIAGNOSIS — J189 Pneumonia, unspecified organism: Secondary | ICD-10-CM

## 2019-12-03 LAB — CBC WITH DIFFERENTIAL/PLATELET
Abs Immature Granulocytes: 1 10*3/uL — ABNORMAL HIGH (ref 0.00–0.07)
Basophils Absolute: 0.2 10*3/uL — ABNORMAL HIGH (ref 0.0–0.1)
Basophils Relative: 1 %
Eosinophils Absolute: 0.3 10*3/uL (ref 0.0–0.5)
Eosinophils Relative: 2 %
HCT: 26.9 % — ABNORMAL LOW (ref 39.0–52.0)
Hemoglobin: 8.7 g/dL — ABNORMAL LOW (ref 13.0–17.0)
Immature Granulocytes: 5 %
Lymphocytes Relative: 13 %
Lymphs Abs: 2.4 10*3/uL (ref 0.7–4.0)
MCH: 26.5 pg (ref 26.0–34.0)
MCHC: 32.3 g/dL (ref 30.0–36.0)
MCV: 82 fL (ref 80.0–100.0)
Monocytes Absolute: 0.4 10*3/uL (ref 0.1–1.0)
Monocytes Relative: 2 %
Neutro Abs: 14.6 10*3/uL — ABNORMAL HIGH (ref 1.7–7.7)
Neutrophils Relative %: 77 %
Platelets: 197 10*3/uL (ref 150–400)
RBC: 3.28 MIL/uL — ABNORMAL LOW (ref 4.22–5.81)
RDW: 19.3 % — ABNORMAL HIGH (ref 11.5–15.5)
WBC: 18.9 10*3/uL — ABNORMAL HIGH (ref 4.0–10.5)
nRBC: 0.1 % (ref 0.0–0.2)

## 2019-12-03 LAB — COMPREHENSIVE METABOLIC PANEL
ALT: 18 U/L (ref 0–44)
AST: 22 U/L (ref 15–41)
Albumin: 2.4 g/dL — ABNORMAL LOW (ref 3.5–5.0)
Alkaline Phosphatase: 80 U/L (ref 38–126)
Anion gap: 6 (ref 5–15)
BUN: 28 mg/dL — ABNORMAL HIGH (ref 8–23)
CO2: 22 mmol/L (ref 22–32)
Calcium: 8.4 mg/dL — ABNORMAL LOW (ref 8.9–10.3)
Chloride: 107 mmol/L (ref 98–111)
Creatinine, Ser: 1.41 mg/dL — ABNORMAL HIGH (ref 0.61–1.24)
GFR, Estimated: 51 mL/min — ABNORMAL LOW (ref >60)
Glucose, Bld: 96 mg/dL (ref 70–99)
Potassium: 4.6 mmol/L (ref 3.5–5.1)
Sodium: 135 mmol/L (ref 135–145)
Total Bilirubin: 0.8 mg/dL (ref 0.3–1.2)
Total Protein: 7.4 g/dL (ref 6.5–8.1)

## 2019-12-03 LAB — URINE CULTURE: Culture: NO GROWTH

## 2019-12-03 LAB — MAGNESIUM: Magnesium: 1.6 mg/dL — ABNORMAL LOW (ref 1.7–2.4)

## 2019-12-03 MED ORDER — MAGNESIUM SULFATE 2 GM/50ML IV SOLN
2.0000 g | Freq: Once | INTRAVENOUS | Status: AC
  Administered 2019-12-03: 2 g via INTRAVENOUS
  Filled 2019-12-03: qty 50

## 2019-12-03 MED ORDER — SODIUM CHLORIDE 0.9 % IV SOLN
INTRAVENOUS | Status: DC | PRN
  Administered 2019-12-03: 500 mL via INTRAVENOUS

## 2019-12-03 MED ORDER — FUROSEMIDE 10 MG/ML IJ SOLN
40.0000 mg | Freq: Two times a day (BID) | INTRAMUSCULAR | Status: DC
  Administered 2019-12-03 – 2019-12-04 (×3): 40 mg via INTRAVENOUS
  Filled 2019-12-03 (×3): qty 4

## 2019-12-03 NOTE — Consult Note (Signed)
NAME:  Paul [name], MRN:  [id number], DOB:  [birth year], LOS: 2 ADMISSION DATE:  12/01/2019, CONSULTATION DATE:  12/03/19 REFERRING MD:  Mckinley Jewel, MD CHIEF COMPLAINT:  hemoptysis  Brief History   79 year old man admitted with reported shortness of breath and chief complaint to me of hemoptysis whom we are consulted for evaluation of abnormal CT scan.   History of present illness   Patient notes chronic daily cough for months. Productive of white to tan sputum. 2 days ago started coughing in recliner and noted blood mixed in sputum. This prompted trip to ED. He denies any preceding shortness of breath. Has had LE swelling. In ED, had worsened leukocytosis with left shift. CXR with bilateral pleural effusions and prominent interstitial markings on my interpretation. CTA obtained without PE but re-demonstration of chronic R pleural effusion, anterior upper lobe interstitial thickening worse than prior, lower lobe interstitial thickening worse than prior, new left pleural effusion, GGOS, and denser infiltrates in upper lobes anteriorly on my interpretation.   He has received CTX and azithro and IV diuresis. He feels a bit crummy. Maybe a bit better than when he was admitted. Notes cough had improved in frequency, hemoptysis is better, sputum production is less. Unsure if LE swelling better.  Leukocytosis improving.  Past Medical History  RA, gout, HTN, HLD  Significant Hospital Events   Admitted 10/30  Consults:  n/a  Procedures:  n/a  Significant Diagnostic Tests:  CT chest 10/30 as discussed in this note  Micro Data:  Blood cx NGTD, COVID negative   Antimicrobials:  Azithro, CTX  Interim history/subjective:  n/a  Objective   Blood pressure (!) 149/61, pulse 67, temperature 97.9 F (36.6 C), temperature source Oral, resp. rate 18, height 5' 10" (1.778 m), weight 86.7 kg, SpO2 97 %.        Intake/Output Summary (Last 24 hours) at 12/03/2019 1445 Last data filed at  12/03/2019 1230 Gross per 24 hour  Intake 1491.93 ml  Output 1950 ml  Net -458.07 ml   Filed Weights   12/01/19 1926 12/02/19 1931  Weight: 84.4 kg 86.7 kg    Examination: General: lying in bed, in NAD Eyes: EOMI, no icterus Neck: No JVP, supple Lungs: Crackles in bases, diminished in bases, no wheeze Cardiovascular: RRR, no murmurs Abdomen: soft, BS present Extremities: warm, no edema MSK: no synovitis, no joint effusions Neuro: No weakness, sensation intact Psych: normal mood, full affect  Resolved Hospital Problem list   n/a  Assessment & Plan:  Abnormal CT: Bilateral upper lobe predominant infiltrates and GGOs with increased interstitial thickening. Bilateral pleural effusions. Suspect element of volume overload. In addition, he has hemoptysis which could be related to symptoms consistent with chronic bronchitis. Another explanation for CT changes and hemoptysis would be CAP. Given symptoms and leukocytosis (above chronic baseline leukocytosis) favor CAP or viral pneumonia of cause of symptoms and acute CT changes. Fortunately, hemoptysis, cough, and leukocytosis improving on current therapy.  As such, defer bronchoscopy at this time noting he is immunocompromised due to his RA meds.  He has chronic changes on CXR and CT consistent with mild fibrosis as well as chronic R effusion. 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   Recommendations: --Continue CTX and azithro for CAP --Aggressive diuresis as suspect element of volume overload, daily standing weights  --ESR, CRP, Anti-CCP, RF --Walk patient prior to discharge and if needed provide ambulatory oxygen --Will arrange outpatient follow up in pulmonary  clinic with repeat imaging when euvolemic, further diagnostic studies to be considered in outpatient setting   Best practice:  Per primary  Labs   CBC: Recent Labs  Lab 11/29/19 1319 12/01/19 1915 12/02/19 1037 12/03/19 0323  WBC 16.2* 31.9*   32.0* 24.4* 18.9*  NEUTROABS 10.3* 21.0*  --  14.6*  HGB 10.0* 11.2*  11.0* 9.4* 8.7*  HCT 30.3* 34.9*  34.7* 29.2* 26.9*  MCV 81.2 82.9  82.4 83.0 82.0  PLT 214 258  275 222 536    Basic Metabolic Panel: Recent Labs  Lab 12/01/19 1915 12/02/19 1032 12/03/19 0323  NA 131* 133* 135  K 4.7 4.6 4.6  CL 105 107 107  CO2 18* 21* 22  GLUCOSE 130* 138* 96  BUN 26* 25* 28*  CREATININE 1.36* 1.26* 1.41*  CALCIUM 8.5* 8.5* 8.4*  MG  --   --  1.6*   GFR: Estimated Creatinine Clearance: 45.3 mL/min (A) (by C-G formula based on SCr of 1.41 mg/dL (H)). Recent Labs  Lab 11/29/19 1319 12/01/19 1915 12/01/19 2019 12/02/19 1032 12/02/19 1037 12/03/19 0323  PROCALCITON  --   --   --  0.18  --   --   WBC 16.2* 31.9*  32.0*  --   --  24.4* 18.9*  LATICACIDVEN  --   --  1.1 1.2  --   --     Liver Function Tests: Recent Labs  Lab 12/01/19 2019 12/02/19 1032 12/03/19 0323  AST _0 ALT _1 ALKPHOS 83 74 80  BILITOT 1.3* 0.9 0.8  PROT 7.5 7.5 7.4  ALBUMIN 2.5* 2.6* 2.4*   No results for input(s): LIPASE, AMYLASE in the last 168 hours. No results for input(s): AMMONIA in the last 168 hours.  ABG No results found for: PHART, PCO2ART, PO2ART, HCO3, TCO2, ACIDBASEDEF, O2SAT   Coagulation Profile: Recent Labs  Lab 12/01/19 2019  INR 1.2    Cardiac Enzymes: No results for input(s): CKTOTAL, CKMB, CKMBINDEX, TROPONINI in the last 168 hours.  HbA1C: Hemoglobin A1C  Date/Time Value Ref Range Status  06/12/2019 03:19 PM 5.2 4.0 - 5.6 % Final  01/17/2019 02:49 PM 5.7 (A) 4.0 - 5.6 % Final  04/25/2015 12:00 AM 7.7  Final   Hgb A1c MFr Bld  Date/Time Value Ref Range Status  12/02/2019 10:34 AM 4.7 (L) 4.8 - 5.6 % Final    Comment:    (NOTE) Pre diabetes:          5.7%-6.4%  Diabetes:              >6.4%  Glycemic control for   <7.0% adults with diabetes   09/02/2018 05:06 AM 5.2 4.8 - 5.6 % Final    Comment:    (NOTE) Pre diabetes:           5.7%-6.4% Diabetes:              >6.4% Glycemic control for   <7.0% adults with diabetes     CBG: No results for input(s): GLUCAP in the last 168 hours.  Review of Systems:   No chest pain. No orthopnea or shortness of breath. No weight loss.   Past Medical History  He,  has a past medical history of AAA (abdominal aortic aneurysm) ([place]), Abnormal LFTs, AKI (acute kidney injury) ([place]), Anemia of chronic disease, Arthritis, Atherosclerosis of native coronary artery of native heart without angina pectoris, CAD (coronary artery disease), Chronic back pain, Constipation, Coronary atherosclerosis of native coronary  artery, Dehydration, Diabetes mellitus, Diarrhea, Elevated liver enzymes, Generalized weakness, Gout of right hand, Hiatal hernia, History of gastroesophageal reflux (GERD), History of stomach ulcers, Hyperlipidemia, Hypertension, Metabolic acidosis, Normochromic anemia (02/23/2016), 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   Surgical History    Past Surgical History:  Procedure Laterality Date  . ANKLE FRACTURE SURGERY Left    "crushed"  . BACK SURGERY  X 3   "fell 4 stories in 1977"  . CARPAL TUNNEL RELEASE Left   . CORONARY ANGIOPLASTY WITH STENT PLACEMENT  09/26/2008   [name] 06/03/2010  . FRACTURE SURGERY    . LEFT HEART CATHETERIZATION WITH CORONARY ANGIOGRAM N/A 01/21/2012   Procedure: LEFT HEART CATHETERIZATION WITH CORONARY ANGIOGRAM;  Surgeon: Burnell Blanks, MD;  Location: [facility] CATH LAB;  Service: Cardiovascular;  Laterality: N/A;  . LUMBAR [place]; ~ 1980  . POSTERIOR LUMBAR FUSION  ~ 1998   "3rd OR to reconstruct my back after fall in 1977"  . 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 He has a 10.00 pack-year smoking history. He has never used smokeless  tobacco. He reports previous alcohol use. He reports that he does not use drugs.   Family History   His family history includes Cancer in his mother; Heart attack in his mother and sister; Heart disease in his mother and sister.   Allergies Allergies  Allergen Reactions  . Allopurinol Other (See Comments)    thrombocytopenia  . Metformin And Related Diarrhea     Home Medications  Prior to Admission medications   Medication Sig Start Date End Date Taking? Authorizing Provider  amLODipine (NORVASC) 5 MG tablet Take 1 tablet (5 mg total) by mouth daily. 02/14/19  Yes Burnell Blanks, MD  aspirin 81 MG chewable tablet Chew 81 mg by mouth daily.   Yes [provider]  clopidogrel (PLAVIX) 75 MG tablet TAKE 1 TABLET BY MOUTH  DAILY 11/23/19  Yes Denita Lung, MD  ENBREL SURECLICK 50 MG/ML injection Inject 50 mg into the skin once a week.  04/26/19  Yes [provider]  febuxostat (ULORIC) 40 MG tablet Take 40 mg by mouth daily.   Yes [provider]  fish oil-omega-3 fatty acids 1000 MG capsule Take 1 g by mouth 2 (two) times daily.    Yes [provider]  isosorbide mononitrate (IMDUR) 30 MG 24 hr tablet Take one tablet by mouth every morning, take two tablets by mouth every evening 02/14/19  Yes Burnell Blanks, MD  leflunomide (ARAVA) 20 MG tablet Take 20 mg by mouth daily. 05/21/19  Yes [provider]  metoprolol tartrate (LOPRESSOR) 25 MG tablet TAKE 1 TABLET BY MOUTH  TWICE DAILY 10/01/19  Yes Burnell Blanks, MD  Multiple Vitamins-Minerals (CENTRUM) tablet Take 1 tablet by mouth daily.   Yes [provider]  nitroGLYCERIN (NITROSTAT) 0.4 MG SL tablet Place 0.4 mg under the tongue every 5 (five) minutes as needed for chest pain. For 3 doses 10/12/11  Yes Denita Lung, MD  pyridoxine (B-6) 100 MG tablet Take 100 mg by mouth daily.   Yes [provider]  simvastatin (ZOCOR) 20 MG tablet Take 1 tablet (20  mg total) by mouth at bedtime. 11/23/18  Yes Burnell Blanks, MD  doxycycline (VIBRA-TABS) 100 MG tablet Take 1  tablet (100 mg total) by mouth 2 (two) times daily. Patient not taking: Reported on 12/01/2019 09/18/19   Evelina Bucy, DPM  glucose blood (ACCU-CHEK AVIVA PLUS) test strip Test once to twice daily 02/06/18   Denita Lung, MD  silver sulfADIAZINE (SILVADENE) 1 % cream Apply pea-sized amount to wound daily. Patient not taking: Reported on 12/01/2019 03/30/19   Evelina Bucy, DPM     Critical care time: n/a

## 2019-12-03 NOTE — Progress Notes (Addendum)
PROGRESS NOTE    Paul [name]  TUU:[id number] DOB: 03-31-1940 DOA: 12/01/2019 PCP: Paul Lung, MD   Brief Narrative:  Paul [name] is a 79 y.o. 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  ED course: Patient afebrile, tachypneic, hypoxic with oxygen saturation 88 to 90% on room air placed on 2 L supplemental oxygen, blood pressure elevated upon arrival.  Subsequent reading significantly improved without administration of any antihypertensive medication.  WBC 31.9 with left shift, H&H 11.2/34.9, platelet: 258, sodium 131, potassium: 4.7 bicarb: 18, lactic acid: 1.1, INR: 1.2, troponin: Negative.  EKG without acute ischemic changes.  D-dimer: 1.19, UA and urine culture, blood culture: Pending, COVID-19 negative.  Influenza panel: Negative.  Chest x-ray shows moderate severity bilateral multifocal infiltrate and small bilateral pleural effusion.  CTA chest came back negative for PE.  Consistent with granulomatous disease and progressive pulmonary fibrosis.  Interlobular septal thickening with bilateral groundglass airspace opacities.  Findings concerning for pulmonary edema versus atypical infectious process.  Patient was given Rocephin and azithromycin and IV fluid as per sepsis protocol in the ED and admitted for further management of sepsis and acute hypoxemic respiratory failure likely secondary to pneumonia versus fluid overload.  Assessment & Plan:  Acute hypoxemic respiratory failure: In the setting of pulmonary fibrosis, severe sepsis  2/2 CAP & acute on chronic diastolic CHF: Improving -Patient tachypneic, WBC more than 30,000, hypoxic upon arrival.  Meet sepsis criteria -Afebrile  with normal lactic acid  -Reviewed chest x-ray and CTA chest, COVID-19 negative -Remained afebrile.  WBC trended down from 30,000-18,000-15.3 this morning. -Continue antibiotics for coverage of bacterial pneumonia including ceftriaxone and azithromycin.  -BNP elevated from baseline-610 -Repeat transthoracic echo-shows ejection fraction of 55 to 79%, grade 2 diastolic dysfunction, moderate to severe AR. -We will try to wean off of oxygen as tolerated -Strict INO's and daily weight. -Start on Lasix IV twice daily.  Consult PCCM.  Hemoptysis: Improving -Multifactorial-CAP versus bronchitis -PT/INR, APTT: WNL -Monitor H&H.  Continue to hold hold aspirin and Plavix -Consult PCCM.  AKI on CKD stage IIIb: Creatinine: 1.41, GFR: 50 trended down from 59. -Avoid nephrotoxic medication. -Decrease IV Lasix from twice daily to once daily  Mild hyponatremia: Resolved Possibly due to poor oral intake in setting of acute illness.  Sodium 131--> 133  Normal anion gap metabolic acidosis: Resolved Bicarb 18--improved to 22, anion gap 8.  Lactic acid normal.   CAD with stent: Stable.  No ACS symptoms. -Aspirin and Plavix was hold due to hemoptysis.   -Continue  statin and metoprolol.   Liver cirrhosis: -Noted on CTA chest.  -PT/INR: WNL, liver enzymes: WNL.  Mildly elevated total bilirubin: 1.3.   -Hepatitis panel: Negative  Diet controlled type 2 diabetes -A1c 4.7%.  Hypertension: Stable -Continue home meds metoprolol, amlodipine and Imdur.  Monitor blood pressure closely.    Hyperlipidemia -Continue Zocor  Rheumatoid arthritis -On weekly Enbrel injections (every Wednesday).  Continue leflunomide.  MDS/MPN/anemia of chronic disease: -Followed by oncology outpatient -Gets epoetin injections every 10 days H&H trended down from 11-8.7 today. -Patient reports hemoptysis.  Continue to monitor H&H closely.  Transfuse as needed.  Hypomagnesemia: Replenished.  Repeat magnesium level  tomorrow AM.  Gout -Continue febuxostat  Chronic lower back pain: -  Outpatient follow-up with PCP and MRI once discharged from the hospital  DVT prophylaxis: SCD, no chemical prophylaxis due to hemoptysis. Code Status: Full code Family Communication:None present at bedside.  Plan of care discussed with patient in length and he verbalized understanding and agreed with it. Disposition Plan: Home Consultants:  PCCM Procedures:   CT chest  Echo  Antimicrobials:   Rocephin  Azithromycin  Status is: Inpatient   Dispo: The patient is from: Home              Anticipated d/c is to: Home              Anticipated d/c date is: 12/05/2019              Patient currently not medically stable for the discharge.   Subjective: Patient seen and examined.  Resting comfortably on the bed.  Tells me that his breathing is better however he continues to have worsening lower back pain.  Denies numbness or weakness tingling or sensation in bilateral lower extremities.    objective: Vitals:   12/02/19 1931 12/02/19 2302 12/03/19 0104 12/03/19 0817  BP: (!) 151/53 (!) 150/52 (!) 141/92 (!) 149/61  Pulse: 69 82 72 67  Resp: 15   18  Temp: (!) 97.5 F (36.4 C) 98.9 F (37.2 C) 98.8 F (37.1 C) 97.9 F (36.6 C)  TempSrc: Oral Oral Oral Oral  SpO2: 97% 97% 96% 97%  Weight: 86.7 kg     Height:        Intake/Output Summary (Last 24 hours) at 12/03/2019 1400 Last data filed at 12/03/2019 1011 Gross per 24 hour  Intake 1371.93 ml  Output 1950 ml  Net -578.07 ml   Filed Weights   12/01/19 1926 12/02/19 1931  Weight: 84.4 kg 86.7 kg    Examination:  General exam: Appears calm and comfortable on 2 L of oxygen via nasal cannula.,  Communicating well Respiratory system: Clear to auscultation. Respiratory effort normal. Cardiovascular system: S1 & S2 heard, RRR. No JVD, murmurs, rubs, gallops or clicks. No pedal edema. Gastrointestinal system: Abdomen is nondistended, soft and nontender. No  organomegaly or masses felt. Normal bowel sounds heard. Central nervous system: Alert and oriented. No focal neurological deficits. Extremities: Symmetric 5 x 5 power. Skin: No rashes, lesions or ulcers. Psychiatry: Judgement and insight appear normal. Mood & affect appropriate.   Data Reviewed: I have personally reviewed following labs and imaging studies  CBC: Recent Labs  Lab 11/29/19 1319 12/01/19 1915 12/02/19 1037 12/03/19 0323  WBC 16.2* 31.9*  32.0* 24.4* 18.9*  NEUTROABS 10.3* 21.0*  --  14.6*  HGB 10.0* 11.2*  11.0* 9.4* 8.7*  HCT 30.3* 34.9*  34.7* 29.2* 26.9*  MCV 81.2 82.9  82.4 83.0 82.0  PLT 214 258  275 222 010   Basic Metabolic Panel: Recent Labs  Lab 12/01/19 1915 12/02/19 1032 12/03/19 0323  NA 131* 133* 135  K 4.7 4.6 4.6  CL 105 107 107  CO2 18* 21* 22  GLUCOSE 130* 138* 96  BUN 26* 25* 28*  CREATININE 1.36* 1.26* 1.41*  CALCIUM 8.5* 8.5* 8.4*  MG  --   --  1.6*   GFR: Estimated Creatinine Clearance: 45.3 mL/min (A) (by C-G formula based on SCr of 1.41 mg/dL (H)). Liver Function Tests: Recent Labs  Lab 12/01/19 2019 12/02/19 1032 12/03/19 0323  AST 25 19 22   ALT 17 17 18   ALKPHOS 83 74 80  BILITOT 1.3* 0.9 0.8  PROT 7.5 7.5  7.4  ALBUMIN 2.5* 2.6* 2.4*   No results for input(s): LIPASE, AMYLASE in the last 168 hours. No results for input(s): AMMONIA in the last 168 hours. Coagulation Profile: Recent Labs  Lab 12/01/19 2019  INR 1.2   Cardiac Enzymes: No results for input(s): CKTOTAL, CKMB, CKMBINDEX, TROPONINI in the last 168 hours. BNP (last 3 results) No results for input(s): PROBNP in the last 8760 hours. HbA1C: Recent Labs    12/02/19 1034  HGBA1C 4.7*   CBG: No results for input(s): GLUCAP in the last 168 hours. Lipid Profile: Recent Labs    12/02/19 1032  CHOL 70  HDL 25*  LDLCALC 35  TRIG 51  CHOLHDL 2.8   Thyroid Function Tests: No results for input(s): TSH, T4TOTAL, FREET4, T3FREE, THYROIDAB in the  last 72 hours. Anemia Panel: No results for input(s): VITAMINB12, FOLATE, FERRITIN, TIBC, IRON, RETICCTPCT in the last 72 hours. Sepsis Labs: Recent Labs  Lab 12/01/19 2019 12/02/19 1032  PROCALCITON  --  0.18  LATICACIDVEN 1.1 1.2    Recent Results (from the past 240 hour(s))  Blood Culture (routine x 2)     Status: None (Preliminary result)   Collection Time: 12/01/19  9:06 PM   Specimen: BLOOD LEFT ARM  Result Value Ref Range Status   Specimen Description BLOOD LEFT ARM  Final   Special Requests   Final    BOTTLES DRAWN AEROBIC AND ANAEROBIC Blood Culture adequate volume   Culture   Final    NO GROWTH 2 DAYS Performed at [facility], [place] [address], [place], [place] [address]    Report Status PENDING  Incomplete  Blood Culture (routine x 2)     Status: None (Preliminary result)   Collection Time: 12/01/19  9:26 PM   Specimen: BLOOD LEFT HAND  Result Value Ref Range Status   Specimen Description BLOOD LEFT HAND  Final   Special Requests   Final    BOTTLES DRAWN AEROBIC AND ANAEROBIC Blood Culture adequate volume   Culture   Final    NO GROWTH 2 DAYS Performed at [facility], [place] [address], [place], [place] [address]    Report Status PENDING  Incomplete  Respiratory Panel by RT PCR (Flu A&B, Covid) - Nasopharyngeal Swab     Status: None   Collection Time: 12/01/19  9:32 PM   Specimen: Nasopharyngeal Swab  Result Value Ref Range Status   SARS Coronavirus 2 by RT PCR NEGATIVE NEGATIVE Final    Comment: (NOTE) SARS-CoV-2 target nucleic acids are NOT DETECTED.  The SARS-CoV-2 RNA is generally detectable in upper respiratoy specimens during the acute phase of infection. The lowest concentration of SARS-CoV-2 viral copies this assay can detect is 131 copies/mL. A negative result does not preclude SARS-Cov-2 infection and should not be used as the sole basis for treatment or other patient management decisions. A negative result may occur with  improper  specimen collection/handling, submission of specimen other than nasopharyngeal swab, presence of viral mutation(s) within the areas targeted by this assay, and inadequate number of viral copies (<131 copies/mL). A negative result must be combined with clinical observations, patient history, and epidemiological information. The expected result is Negative.  Fact Sheet for Patients:  PinkCheek.be  Fact Sheet for Healthcare Providers:  GravelBags.it  This test is no t yet approved or cleared by the [place] FDA and  has been authorized for detection and/or diagnosis of SARS-CoV-2 by FDA under an Emergency Use Authorization (EUA). This EUA  will remain  in effect (meaning this test can be used) for the duration of the COVID-19 declaration under Section 564(b)(1) of the Act, 21 U.S.C. section 360bbb-3(b)(1), unless the authorization is terminated or revoked sooner.     Influenza A by PCR NEGATIVE NEGATIVE Final   Influenza B by PCR NEGATIVE NEGATIVE Final    Comment: (NOTE) The Xpert Xpress SARS-CoV-2/FLU/RSV assay is intended as an aid in  the diagnosis of influenza from Nasopharyngeal swab specimens and  should not be used as a sole basis for treatment. Nasal washings and  aspirates are unacceptable for Xpert Xpress SARS-CoV-2/FLU/RSV  testing.  Fact Sheet for Patients: PinkCheek.be  Fact Sheet for Healthcare Providers: GravelBags.it  This test is not yet approved or cleared by the [place] FDA and  has been authorized for detection and/or diagnosis of SARS-CoV-2 by  FDA under an Emergency Use Authorization (EUA). This EUA will remain  in effect (meaning this test can be used) for the duration of the  Covid-19 declaration under Section 564(b)(1) of the Act, 21  U.S.C. section 360bbb-3(b)(1), unless the authorization is  terminated or revoked. Performed at  [facility], [place] [address], [place], [place] [address]   Urine culture     Status: None   Collection Time: 12/02/19 12:07 AM   Specimen: In/Out Cath Urine  Result Value Ref Range Status   Specimen Description IN/OUT CATH URINE  Final   Special Requests NONE  Final   Culture   Final    NO GROWTH Performed at [facility], [place] [address], [place], [place] [address]    Report Status 12/03/2019 FINAL  Final      Radiology Studies: DG Chest 2 View  Result Date: 12/01/2019 CLINICAL DATA:  Hemoptysis. EXAM: CHEST - 2 VIEW COMPARISON:  September 02, 2018 FINDINGS: Moderate severity multifocal infiltrates are seen involving the bilateral [name] bases and mid [name] fields. The mild diffusely increased interstitial [name] markings are also seen. Biapical pleural thickening and small bilateral pleural effusions are noted. No pneumothorax is identified. The heart size and mediastinal contours are within normal limits. Multilevel degenerative changes seen throughout the thoracic spine. IMPRESSION: 1. Moderate severity bilateral multifocal infiltrates. 2. Small bilateral pleural effusions. Electronically Signed   By: Virgina Norfolk M.D.   On: 12/01/2019 19:24   CT Angio Chest PE W and/or Wo Contrast  Result Date: 12/01/2019 CLINICAL DATA:  Shortness of breath with back pain. EXAM: CT ANGIOGRAPHY CHEST WITH CONTRAST TECHNIQUE: Multidetector CT imaging of the chest was performed using the standard protocol during bolus administration of intravenous contrast. Multiplanar CT image reconstructions and MIPs were obtained to evaluate the vascular anatomy. CONTRAST:  13mL OMNIPAQUE IOHEXOL 350 MG/ML SOLN COMPARISON:  CT chest dated 11/03/2017. FINDINGS: Cardiovascular: Evaluation is significantly limited by respiratory motion artifact. Given this limitation, no acute pulmonary embolism was detected. The main pulmonary artery is not significantly dilated. There are atherosclerotic changes of the thoracic  aorta without evidence for an aneurysm. The heart size is enlarged. There are coronary artery calcifications. Mediastinum/Nodes: --mediastinal adenopathy is noted, likely reactive. There are several calcified mediastinal lymph nodes. --hilar adenopathy is noted, likely reactive. -- No axillary lymphadenopathy. -- No supraclavicular lymphadenopathy. -- Normal thyroid gland where visualized. -  Unremarkable esophagus. Lungs/Pleura: Again noted are findings of pulmonary fibrosis. This appears to have significantly progressed since prior study. There is interlobular septal thickening. There are ground-glass airspace opacities bilaterally most evident in the left upper lobe and in the  perihilar distribution. There are small to moderate-sized bilateral pleural effusions, left worse than right. There is no pneumothorax. The [name] volumes are low. Upper Abdomen: Contrast bolus timing is not optimized for evaluation of the abdominal organs. There are numerable calcifications throughout the liver and spleen. Musculoskeletal: There is an age-indeterminate fracture through the superior endplate of the X73 vertebral body, favored to be chronic. There is subtle sclerosis and height loss of the T10 vertebral body, likely chronic. The liver surface appears lobular. Review of the MIP images confirms the above findings. IMPRESSION: 1. Evaluation is significantly limited by respiratory motion artifact. Given this limitation, no acute pulmonary embolism was detected. 2. Findings consistent with pulmonary fibrosis, progressed since prior study. 3. Interlobular septal thickening with bilateral ground-glass airspace opacities is suggestive of pulmonary edema. An atypical infectious process is not excluded. 4. Cardiomegaly with coronary artery disease. 5. Mediastinal and hilar adenopathy, likely reactive. 6. Findings of prior granulomatous disease. 7. Lobular liver surface suspicious for underlying cirrhosis. Aortic Atherosclerosis  (ICD10-I70.0). Electronically Signed   By: Constance Holster M.D.   On: 12/01/2019 22:01   ECHOCARDIOGRAM COMPLETE  Result Date: 12/02/2019    ECHOCARDIOGRAM REPORT   Patient Name:   Paul [name] Date of Exam: 12/02/2019 Medical Rec #:  [id number]          Height:       70.0 in Accession #:    [id number]         Weight:       186.0 lb Date of Birth:  May 01, 1940           BSA:          2.024 m Patient Age:    8 years           BP:           157/84 mmHg Patient Gender: M                  HR:           71 bpm. Exam Location:  Inpatient Procedure: 2D Echo, Cardiac Doppler and Color Doppler Indications:    Congestive Heart Failure 428.0 / I50.9  History:        Patient has prior history of Echocardiogram examinations, most                 recent 09/02/2018. CAD; Risk Factors:Current Smoker, Hypertension,                 Diabetes and Dyslipidemia.  Sonographer:    Alvino Chapel RCS Referring Phys: [id number] [place]  1. Left ventricular ejection fraction, by estimation, is 55 to 60%. The left ventricle has normal function. The left ventricle has no regional wall motion abnormalities. Left ventricular diastolic parameters are consistent with Grade II diastolic dysfunction (pseudonormalization). Elevated left ventricular end-diastolic pressure.  2. Right ventricular systolic function is normal. The right ventricular size is normal. There is moderately elevated pulmonary artery systolic pressure. The estimated right ventricular systolic pressure is 94.1 mmHg.  3. Left atrial size was mildly dilated.  4. The mitral valve is normal in structure. Trivial mitral valve regurgitation. No evidence of mitral stenosis.  5. The aortic valve is tricuspid. Aortic valve regurgitation is moderate to severe. Mild aortic valve stenosis. Aortic valve area, by VTI measures 1.52 cm. Aortic valve mean gradient measures 11.0 mmHg. Aortic valve Vmax measures 2.30 m/s.  6. The inferior vena cava is dilated in size with  <50% respiratory  variability, suggesting right atrial pressure of 15 mmHg. FINDINGS  Left Ventricle: Left ventricular ejection fraction, by estimation, is 55 to 60%. The left ventricle has normal function. The left ventricle has no regional wall motion abnormalities. The left ventricular internal cavity size was normal in size. There is  no left ventricular hypertrophy. Left ventricular diastolic parameters are consistent with Grade II diastolic dysfunction (pseudonormalization). Elevated left ventricular end-diastolic pressure. Right Ventricle: The right ventricular size is normal. No increase in right ventricular wall thickness. Right ventricular systolic function is normal. There is moderately elevated pulmonary artery systolic pressure. The tricuspid regurgitant velocity is 3.41 m/s, and with an assumed right atrial pressure of 15 mmHg, the estimated right ventricular systolic pressure is 29.5 mmHg. Left Atrium: Left atrial size was mildly dilated. Right Atrium: Right atrial size was normal in size. Pericardium: There is no evidence of pericardial effusion. Mitral Valve: The mitral valve is normal in structure. There is mild thickening of the mitral valve leaflet(s). Trivial mitral valve regurgitation. No evidence of mitral valve stenosis. Tricuspid Valve: The tricuspid valve is normal in structure. Tricuspid valve regurgitation is mild . No evidence of tricuspid stenosis. Aortic Valve: The aortic valve is tricuspid. Aortic valve regurgitation is moderate to severe. Aortic regurgitation PHT measures 221 msec. Mild aortic stenosis is present. Aortic valve mean gradient measures 11.0 mmHg. Aortic valve peak gradient measures  21.2 mmHg. Aortic valve area, by VTI measures 1.52 cm. Pulmonic Valve: The pulmonic valve was normal in structure. Pulmonic valve regurgitation is trivial. No evidence of pulmonic stenosis. Aorta: The aortic root is normal in size and structure. Venous: The inferior vena cava is dilated in  size with less than 50% respiratory variability, suggesting right atrial pressure of 15 mmHg. IAS/Shunts: No atrial level shunt detected by color flow Doppler.  LEFT VENTRICLE PLAX 2D LVIDd:         5.80 cm  Diastology LVIDs:         3.30 cm  LV e' medial:    7.18 cm/s LV PW:         1.10 cm  LV E/e' medial:  15.3 LV IVS:        1.10 cm  LV e' lateral:   9.46 cm/s LVOT diam:     1.90 cm  LV E/e' lateral: 11.6 LV SV:         87 LV SV Index:   43 LVOT Area:     2.84 cm  RIGHT VENTRICLE RV S prime:     14.40 cm/s TAPSE (M-mode): 3.1 cm LEFT ATRIUM             Index       RIGHT ATRIUM           Index LA diam:        4.00 cm 1.98 cm/m  RA Area:     22.20 cm LA Vol (A2C):   77.2 ml 38.14 ml/m RA Volume:   71.80 ml  35.48 ml/m LA Vol (A4C):   86.5 ml 42.74 ml/m LA Biplane Vol: 83.0 ml 41.01 ml/m  AORTIC VALVE AV Area (Vmax):    1.54 cm AV Area (Vmean):   1.54 cm AV Area (VTI):     1.52 cm AV Vmax:           230.00 cm/s AV Vmean:          155.000 cm/s AV VTI:            0.576 m AV Peak Grad:  21.2 mmHg AV Mean Grad:      11.0 mmHg LVOT Vmax:         125.00 cm/s LVOT Vmean:        84.200 cm/s LVOT VTI:          0.308 m LVOT/AV VTI ratio: 0.53 AI PHT:            221 msec  AORTA Ao Root diam: 3.40 cm MITRAL VALVE                TRICUSPID VALVE MV Area (PHT): 4.74 cm     TR Peak grad:   46.5 mmHg MV Decel Time: 160 msec     TR Vmax:        341.00 cm/s MV E velocity: 110.00 cm/s MV A velocity: 96.80 cm/s   SHUNTS MV E/A ratio:  1.14         Systemic VTI:  0.31 m                             Systemic Diam: 1.90 cm Fransico Him MD Electronically signed by Fransico Him MD Signature Date/Time: 12/02/2019/1:32:46 PM    Final     Scheduled Meds: . amLODipine  5 mg Oral Daily  . febuxostat  40 mg Oral Daily  . furosemide  40 mg Intravenous Q12H  . isosorbide mononitrate  30 mg Oral Daily  . isosorbide mononitrate  60 mg Oral QHS  . leflunomide  20 mg Oral Daily  . metoprolol tartrate  25 mg Oral BID  .  simvastatin  20 mg Oral QHS   Continuous Infusions: . azithromycin 500 mg (12/02/19 2244)  . cefTRIAXone (ROCEPHIN)  IV 2 g (12/02/19 2145)     LOS: 2 days   Time spent: 40 minutes  Darrien Belter Loann Quill, MD [facility]  If 7PM-7AM, please contact night-coverage [email or web address] 12/03/2019, 2:00 PM

## 2019-12-04 ENCOUNTER — Inpatient Hospital Stay (HOSPITAL_COMMUNITY): Payer: Medicare Other

## 2019-12-04 DIAGNOSIS — J189 Pneumonia, unspecified organism: Secondary | ICD-10-CM | POA: Diagnosis not present

## 2019-12-04 DIAGNOSIS — R042 Hemoptysis: Secondary | ICD-10-CM | POA: Diagnosis not present

## 2019-12-04 LAB — COMPREHENSIVE METABOLIC PANEL
ALT: 21 U/L (ref 0–44)
AST: 25 U/L (ref 15–41)
Albumin: 2.3 g/dL — ABNORMAL LOW (ref 3.5–5.0)
Alkaline Phosphatase: 85 U/L (ref 38–126)
Anion gap: 8 (ref 5–15)
BUN: 32 mg/dL — ABNORMAL HIGH (ref 8–23)
CO2: 22 mmol/L (ref 22–32)
Calcium: 8.4 mg/dL — ABNORMAL LOW (ref 8.9–10.3)
Chloride: 102 mmol/L (ref 98–111)
Creatinine, Ser: 1.45 mg/dL — ABNORMAL HIGH (ref 0.61–1.24)
GFR, Estimated: 50 mL/min — ABNORMAL LOW (ref >60)
Glucose, Bld: 95 mg/dL (ref 70–99)
Potassium: 4.2 mmol/L (ref 3.5–5.1)
Sodium: 132 mmol/L — ABNORMAL LOW (ref 135–145)
Total Bilirubin: 0.8 mg/dL (ref 0.3–1.2)
Total Protein: 6.8 g/dL (ref 6.5–8.1)

## 2019-12-04 LAB — CBC
HCT: 26.8 % — ABNORMAL LOW (ref 39.0–52.0)
Hemoglobin: 8.7 g/dL — ABNORMAL LOW (ref 13.0–17.0)
MCH: 26.6 pg (ref 26.0–34.0)
MCHC: 32.5 g/dL (ref 30.0–36.0)
MCV: 82 fL (ref 80.0–100.0)
Platelets: 198 10*3/uL (ref 150–400)
RBC: 3.27 MIL/uL — ABNORMAL LOW (ref 4.22–5.81)
RDW: 19.3 % — ABNORMAL HIGH (ref 11.5–15.5)
WBC: 15.3 10*3/uL — ABNORMAL HIGH (ref 4.0–10.5)
nRBC: 0.1 % (ref 0.0–0.2)

## 2019-12-04 LAB — C-REACTIVE PROTEIN: CRP: 6.5 mg/dL — ABNORMAL HIGH (ref <1.0)

## 2019-12-04 LAB — SEDIMENTATION RATE: Sed Rate: 20 mm/hr — ABNORMAL HIGH (ref 0–16)

## 2019-12-04 LAB — MAGNESIUM: Magnesium: 1.8 mg/dL (ref 1.7–2.4)

## 2019-12-04 MED ORDER — FUROSEMIDE 10 MG/ML IJ SOLN: 40.0000 mg | Freq: Every day | INTRAMUSCULAR | Status: DC

## 2019-12-04 MED ORDER — GADOBUTROL 1 MMOL/ML IV SOLN
8.5000 mL | Freq: Once | INTRAVENOUS | Status: AC | PRN
  Administered 2019-12-04: 8.5 mL via INTRAVENOUS

## 2019-12-04 NOTE — Progress Notes (Signed)
SATURATION QUALIFICATIONS:  Patient Saturations on Room Air at Rest = 93%  Patient Saturations on Room Air while Ambulating = 85%  Patient Saturations on 2 Liters of oxygen while Ambulating = 91%  Please briefly explain why patient needs home oxygen: Patient gets short of breath while ambulating or moving around in his room.   Newman Nip

## 2019-12-04 NOTE — Progress Notes (Signed)
PT Cancellation Note  Patient Details Name: [name] MRN: [id number] DOB: 19-Jul-1940   Cancelled Treatment:    Reason Eval/Treat Not Completed: Patient at procedure or test/unavailable Will follow up as schedule allows.   Lou Miner, DPT  Acute Rehabilitation Services  Pager: [phone] Office: [phone]    Rudean Hitt 12/04/2019, 4:23 PM

## 2019-12-04 NOTE — Progress Notes (Signed)
PROGRESS NOTE    Paul SAEFONG  RCV:[id number] DOB: 06-25-1940 DOA: 12/01/2019 PCP: Denita Lung, MD   Brief Narrative:  Paul [name] is a 79 y.o. 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  ED course: Patient afebrile, tachypneic, hypoxic with oxygen saturation 88 to 90% on room air placed on 2 L supplemental oxygen, blood pressure elevated upon arrival.  Subsequent reading significantly improved without administration of any antihypertensive medication.  WBC 31.9 with left shift, H&H 11.2/34.9, platelet: 258, sodium 131, potassium: 4.7 bicarb: 18, lactic acid: 1.1, INR: 1.2, troponin: Negative.  EKG without acute ischemic changes.  D-dimer: 1.19, UA and urine culture, blood culture: Pending, COVID-19 negative.  Influenza panel: Negative.  Chest x-ray shows moderate severity bilateral multifocal infiltrate and small bilateral pleural effusion.  CTA chest came back negative for PE.  Consistent with granulomatous disease and progressive pulmonary fibrosis.  Interlobular septal thickening with bilateral groundglass airspace opacities.  Findings concerning for pulmonary edema versus atypical infectious process.  Patient was given Rocephin and azithromycin and IV fluid as per sepsis protocol in the ED and admitted for further management of sepsis and acute hypoxemic respiratory failure likely secondary to pneumonia versus fluid overload.  Assessment & Plan:  Acute hypoxemic respiratory failure: In the setting of pulmonary fibrosis, severe sepsis  2/2 CAP & acute on chronic diastolic CHF: Improving -Patient tachypneic, WBC more than 30,000, hypoxic upon arrival.  Meet sepsis criteria -Afebrile  with normal lactic acid  -Reviewed chest x-ray and CTA chest, COVID-19 negative -Remained afebrile.  WBC trended down from 30,000-18,000-15.3 this morning. -Continue antibiotics for coverage of bacterial pneumonia including ceftriaxone and azithromycin.  -BNP elevated from baseline-610 -Repeat transthoracic echo-shows ejection fraction of 55 to 10%, grade 2 diastolic dysfunction, moderate to severe AR. -We will try to wean off of oxygen as tolerated -Strict INO's and daily weight. -Change IV Lasix from twice daily to once daily -Appreciate PCCM help -CRP and sed rate: Mildly elevated.  RF: Pending.  Will check patient's oxygen saturation while walking on the day of discharge.  Follow-up with PCCM outpatient. -Ordered incentive spirometry.  Hemoptysis: Improving -Multifactorial-CAP versus bronchitis -PT/INR, APTT: WNL -Monitor H&H.  Continue to hold hold aspirin and Plavix -Appreciate PCCM help-recommended no bronchoscopy given immunocompromised status (history of RA).  Outpatient follow-up with PCCM  AKI on CKD stage IIIb: Creatinine: 1.41, GFR: 50 trended down from 59. -Avoid nephrotoxic medication. -Decrease IV Lasix from twice daily to once daily  Mild hyponatremia: Resolved Possibly due to poor oral intake in setting of acute illness.  Sodium 131--> 133--> 135.  Normal anion gap metabolic acidosis: Resolved Bicarb 18--improved to 22, anion gap 8.  Lactic acid normal.   CAD with stent: Stable.  No ACS symptoms. -Aspirin and Plavix was hold due to hemoptysis.   -Continue  statin and metoprolol.   Liver cirrhosis: -Noted on CTA chest.  -PT/INR: WNL, liver enzymes: WNL.  Mildly elevated total bilirubin: 1.3.   -Hepatitis panel: Negative  Diet controlled type 2 diabetes -A1c 4.7%.  Hypertension: Stable -Continue home meds metoprolol, amlodipine and Imdur.  Monitor blood pressure closely.    Hyperlipidemia -Continue Zocor  Rheumatoid arthritis -On weekly Enbrel  injections (every Wednesday).  Continue leflunomide.  MDS/MPN/anemia of chronic  disease: -Followed by oncology outpatient -Gets epoetin injections every 10 days H&H trended down from 11-8.7 today.  Hypomagnesemia: Replenished.  Repeat magnesium level tomorrow AM.  Gout -Continue febuxostat  Chronic lower back pain: --Reviewed x-ray from 6/21 which showed chronic compression fracture and DDD. -Patient reports worsening lower back pain and requesting for MRI.  MRI lumbar spine ordered.  Consult PT/OT.  DVT prophylaxis: SCD, no chemical prophylaxis due to hemoptysis. Code Status: Full code Family Communication:None present at bedside.  Plan of care discussed with patient in length and he verbalized understanding and agreed with it. Disposition Plan: Home Consultants:  PCCM Procedures:   CT chest  Echo  Antimicrobials:   Rocephin  Azithromycin  Status is: Inpatient   Dispo: The patient is from: Home              Anticipated d/c is to: Home              Anticipated d/c date is: 12/05/2019              Patient currently not medically stable for the discharge.   Subjective: Patient seen and examined.  Resting comfortably on the bed.  Tells me that his breathing has improved however he continues to have hemoptysis.  Tells me that he has severe lower back pain and wishes to get MRI while he is hospitalized.  Objective: Vitals:   12/03/19 2014 12/04/19 0455 12/04/19 0920 12/04/19 0930  BP: (!) 152/52 135/65 (!) 147/59 138/85  Pulse: 67 (!) 54 (!) 58 64  Resp: 19 19 18 18   Temp: 98 F (36.7 C) (!) 97.5 F (36.4 C) 97.6 F (36.4 C) 98.3 F (36.8 C)  TempSrc: Oral Oral Oral Oral  SpO2: 97% 99% 98% 99%  Weight:      Height:        Intake/Output Summary (Last 24 hours) at 12/04/2019 1459 Last data filed at 12/04/2019 1006 Gross per 24 hour  Intake 600 ml  Output 3520 ml  Net -2920 ml   Filed Weights   12/01/19 1926 12/02/19 1931  Weight: 84.4 kg 86.7 kg     Examination:  General exam: Appears calm and comfortable, on 2 L of oxygen via nasal cannula, communicating well Respiratory system: Clear to auscultation. Respiratory effort normal. Cardiovascular system: S1 & S2 heard, RRR. No JVD, murmurs, rubs, gallops or clicks. No pedal edema. Gastrointestinal system: Abdomen is nondistended, soft and nontender. No organomegaly or masses felt. Normal bowel sounds heard. Central nervous system: Alert and oriented. No focal neurological deficits. Extremities: Symmetric 5 x 5 power. Skin: No rashes, lesions or ulcers. Psychiatry: Judgement and insight appear normal. Mood & affect appropriate.   Data Reviewed: I have personally reviewed following labs and imaging studies  CBC: Recent Labs  Lab 11/29/19 1319 12/01/19 1915 12/02/19 1037 12/03/19 0323 12/04/19 0124  WBC 16.2* 31.9*  32.0* 24.4* 18.9* 15.3*  NEUTROABS 10.3* 21.0*  --  14.6*  --   HGB 10.0* 11.2*  11.0* 9.4* 8.7* 8.7*  HCT 30.3* 34.9*  34.7* 29.2* 26.9* 26.8*  MCV 81.2 82.9  82.4 83.0 82.0 82.0  PLT 214 258  275 222 197 784   Basic Metabolic Panel: Recent Labs  Lab 12/01/19 1915 12/02/19 1032 12/03/19 0323 12/04/19 0124  NA 131* 133* 135 132*  K 4.7 4.6 4.6 4.2  CL 105 107 107 102  CO2 18* 21* 22 22  GLUCOSE 130* 138* 96 95  BUN 26* 25* 28* 32*  CREATININE 1.36* 1.26*  1.41* 1.45*  CALCIUM 8.5* 8.5* 8.4* 8.4*  MG  --   --  1.6* 1.8   GFR: Estimated Creatinine Clearance: 44.1 mL/min (A) (by C-G formula based on SCr of 1.45 mg/dL (H)). Liver Function Tests: Recent Labs  Lab 12/01/19 2019 12/02/19 1032 12/03/19 0323 12/04/19 0124  AST 25 19 22 25   ALT 17 17 18 21   ALKPHOS 83 74 80 85  BILITOT 1.3* 0.9 0.8 0.8  PROT 7.5 7.5 7.4 6.8  ALBUMIN 2.5* 2.6* 2.4* 2.3*   No results for input(s): LIPASE, AMYLASE in the last 168 hours. No results for input(s): AMMONIA in the last 168 hours. Coagulation Profile: Recent Labs  Lab 12/01/19 2019  INR 1.2    Cardiac Enzymes: No results for input(s): CKTOTAL, CKMB, CKMBINDEX, TROPONINI in the last 168 hours. BNP (last 3 results) No results for input(s): PROBNP in the last 8760 hours. HbA1C: Recent Labs    12/02/19 1034  HGBA1C 4.7*   CBG: No results for input(s): GLUCAP in the last 168 hours. Lipid Profile: Recent Labs    12/02/19 1032  CHOL 70  HDL 25*  LDLCALC 35  TRIG 51  CHOLHDL 2.8   Thyroid Function Tests: No results for input(s): TSH, T4TOTAL, FREET4, T3FREE, THYROIDAB in the last 72 hours. Anemia Panel: No results for input(s): VITAMINB12, FOLATE, FERRITIN, TIBC, IRON, RETICCTPCT in the last 72 hours. Sepsis Labs: Recent Labs  Lab 12/01/19 2019 12/02/19 1032  PROCALCITON  --  0.18  LATICACIDVEN 1.1 1.2    Recent Results (from the past 240 hour(s))  Blood Culture (routine x 2)     Status: None (Preliminary result)   Collection Time: 12/01/19  9:06 PM   Specimen: BLOOD LEFT ARM  Result Value Ref Range Status   Specimen Description BLOOD LEFT ARM  Final   Special Requests   Final    BOTTLES DRAWN AEROBIC AND ANAEROBIC Blood Culture adequate volume   Culture   Final    NO GROWTH 3 DAYS Performed at [facility], [place] [address], [place], [place] [address]    Report Status PENDING  Incomplete  Blood Culture (routine x 2)     Status: None (Preliminary result)   Collection Time: 12/01/19  9:26 PM   Specimen: BLOOD LEFT HAND  Result Value Ref Range Status   Specimen Description BLOOD LEFT HAND  Final   Special Requests   Final    BOTTLES DRAWN AEROBIC AND ANAEROBIC Blood Culture adequate volume   Culture   Final    NO GROWTH 3 DAYS Performed at [facility], [place] [address], [place], [place] [address]    Report Status PENDING  Incomplete  Respiratory Panel by RT PCR (Flu A&B, Covid) - Nasopharyngeal Swab     Status: None   Collection Time: 12/01/19  9:32 PM   Specimen: Nasopharyngeal Swab  Result Value Ref Range Status   SARS Coronavirus 2 by  RT PCR NEGATIVE NEGATIVE Final    Comment: (NOTE) SARS-CoV-2 target nucleic acids are NOT DETECTED.  The SARS-CoV-2 RNA is generally detectable in upper respiratoy specimens during the acute phase of infection. The lowest concentration of SARS-CoV-2 viral copies this assay can detect is 131 copies/mL. A negative result does not preclude SARS-Cov-2 infection and should not be used as the sole basis for treatment or other patient management decisions. A negative result may occur with  improper specimen collection/handling, submission of specimen other than nasopharyngeal swab, presence of viral mutation(s) within the areas  targeted by this assay, and inadequate number of viral copies (<131 copies/mL). A negative result must be combined with clinical observations, patient history, and epidemiological information. The expected result is Negative.  Fact Sheet for Patients:  PinkCheek.be  Fact Sheet for Healthcare Providers:  GravelBags.it  This test is no t yet approved or cleared by the [place] FDA and  has been authorized for detection and/or diagnosis of SARS-CoV-2 by FDA under an Emergency Use Authorization (EUA). This EUA will remain  in effect (meaning this test can be used) for the duration of the COVID-19 declaration under Section 564(b)(1) of the Act, 21 U.S.C. section 360bbb-3(b)(1), unless the authorization is terminated or revoked sooner.     Influenza A by PCR NEGATIVE NEGATIVE Final   Influenza B by PCR NEGATIVE NEGATIVE Final    Comment: (NOTE) The Xpert Xpress SARS-CoV-2/FLU/RSV assay is intended as an aid in  the diagnosis of influenza from Nasopharyngeal swab specimens and  should not be used as a sole basis for treatment. Nasal washings and  aspirates are unacceptable for Xpert Xpress SARS-CoV-2/FLU/RSV  testing.  Fact Sheet for Patients: PinkCheek.be  Fact Sheet for  Healthcare Providers: GravelBags.it  This test is not yet approved or cleared by the [place] FDA and  has been authorized for detection and/or diagnosis of SARS-CoV-2 by  FDA under an Emergency Use Authorization (EUA). This EUA will remain  in effect (meaning this test can be used) for the duration of the  Covid-19 declaration under Section 564(b)(1) of the Act, 21  U.S.C. section 360bbb-3(b)(1), unless the authorization is  terminated or revoked. Performed at [facility], [place] [address], [place], [place] [address]   Urine culture     Status: None   Collection Time: 12/02/19 12:07 AM   Specimen: In/Out Cath Urine  Result Value Ref Range Status   Specimen Description IN/OUT CATH URINE  Final   Special Requests NONE  Final   Culture   Final    NO GROWTH Performed at [facility], [place] [address], [place], [place] [address]    Report Status 12/03/2019 FINAL  Final      Radiology Studies: No results found.  Scheduled Meds: . amLODipine  5 mg Oral Daily  . febuxostat  40 mg Oral Daily  . [START ON 12/05/2019] furosemide  40 mg Intravenous Daily  . isosorbide mononitrate  30 mg Oral Daily  . isosorbide mononitrate  60 mg Oral QHS  . leflunomide  20 mg Oral Daily  . metoprolol tartrate  25 mg Oral BID  . simvastatin  20 mg Oral QHS   Continuous Infusions: . sodium chloride 500 mL (12/03/19 2154)  . azithromycin 500 mg (12/03/19 2240)  . cefTRIAXone (ROCEPHIN)  IV Stopped (12/03/19 2240)     LOS: 3 days   Time spent: 40 minutes  Kostas Marrow Loann Quill, MD [facility]  If 7PM-7AM, please contact night-coverage [email or web address] 12/04/2019, 2:59 PM

## 2019-12-04 NOTE — Consult Note (Addendum)
NAME:  Paul [name], MRN:  [id number], DOB:  [birth year], LOS: 3 ADMISSION DATE:  12/01/2019, CONSULTATION DATE:  12/04/19 REFERRING MD:  Mckinley Jewel, MD CHIEF COMPLAINT:  hemoptysis  Brief History   79 year old man admitted with reported shortness of breath and chief complaint to me of hemoptysis whom we are consulted for evaluation of abnormal CT scan.   History of present illness   Patient notes chronic daily cough for months. Productive of white to tan sputum. 2 days ago started coughing in recliner and noted blood mixed in sputum. This prompted trip to ED. He denies any preceding shortness of breath. Has had LE swelling. In ED, had worsened leukocytosis with left shift. CXR with bilateral pleural effusions and prominent interstitial markings on my interpretation. CTA obtained without PE but re-demonstration of chronic R pleural effusion, anterior upper lobe interstitial thickening worse than prior, lower lobe interstitial thickening worse than prior, new left pleural effusion, GGOS, and denser infiltrates in upper lobes anteriorly on my interpretation.   He has received CTX and azithro and IV diuresis. He feels a bit crummy. Maybe a bit better than when he was admitted. Notes cough had improved in frequency, hemoptysis is better, sputum production is less. Unsure if LE swelling better.  Leukocytosis improving.  Past Medical History  RA, gout, HTN, HLD  Significant Hospital Events   Admitted 10/30  Consults:  n/a  Procedures:  n/a  Significant Diagnostic Tests:  CT chest 10/30 as discussed in this note  Micro Data:  Blood cx NGTD, COVID negative   Antimicrobials:  Azithro, CTX  Interim history/subjective:  Reports less cough and less hemoptysis. Still feels fatigued  Objective   Blood pressure 138/85, pulse 64, temperature 98.3 F (36.8 C), temperature source Oral, resp. rate 18, height 5\' 10"  (1.778 m), weight 86.7 kg, SpO2 99 %.        Intake/Output Summary  (Last 24 hours) at 12/04/2019 1256 Last data filed at 12/04/2019 1006 Gross per 24 hour  Intake 600 ml  Output 3520 ml  Net -2920 ml   Filed Weights   12/01/19 1926 12/02/19 1931  Weight: 84.4 kg 86.7 kg    Physical Exam: General: Well-appearing, no acute distress HENT: [place], AT, OP clear, MMM Eyes: EOMI, no scleral icterus Respiratory: Mild basilar crackles Cardiovascular: RRR, -M/R/G, no JVD Extremities:-Edema,-tenderness Neuro: AAO x4, CNII-XII grossly intact Skin: Intact, no rashes or bruising Psych: Normal mood, normal affect   Resolved Hospital Problem list   n/a  Assessment & Plan:  Abnormal CT: Bilateral upper lobe predominant infiltrates and GGOs with increased interstitial thickening. Bilateral pleural effusions. Suspect element of volume overload. In addition, he has hemoptysis which could be related to symptoms consistent with chronic bronchitis. Another explanation for CT changes and hemoptysis would be CAP. Given symptoms and leukocytosis (above chronic baseline leukocytosis) favor CAP or viral pneumonia of cause of symptoms and acute CT changes. Fortunately, hemoptysis, cough, and leukocytosis improving on current therapy.  As such, defer bronchoscopy at this time noting he is immunocompromised due to his RA meds.  He has chronic changes on CXR and CT consistent with mild fibrosis as well as chronic R effusion. 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 Mild elevated inflammatory markers. This is non-specific. Symptoms improved, less hemoptysis. On exam sputum with no frank blood or clots, only dark sputum. Leukocytosis improving  Recommendations: --On day 4 of 5 days of antibiotics for CAP --Diuresis PRN --Walk patient prior  to discharge and if needed provide ambulatory oxygen --Will arrange outpatient imaging when euvolemic, further diagnostic studies to be considered in outpatient setting. Patient to follow-up with Dr. Silas Flood.  Pulmonary  will sign off. Please call [phone] for questions or concerns or need for re-consult.  Best practice:  Per primary  Labs   CBC: Recent Labs  Lab 11/29/19 1319 12/01/19 1915 12/02/19 1037 12/03/19 0323 12/04/19 0124  WBC 16.2* 31.9*  32.0* 24.4* 18.9* 15.3*  NEUTROABS 10.3* 21.0*  --  14.6*  --   HGB 10.0* 11.2*  11.0* 9.4* 8.7* 8.7*  HCT 30.3* 34.9*  34.7* 29.2* 26.9* 26.8*  MCV 81.2 82.9  82.4 83.0 82.0 82.0  PLT 214 258  275 222 197 453    Basic Metabolic Panel: Recent Labs  Lab 12/01/19 1915 12/02/19 1032 12/03/19 0323 12/04/19 0124  NA 131* 133* 135 132*  K 4.7 4.6 4.6 4.2  CL 105 107 107 102  CO2 18* 21* 22 22  GLUCOSE 130* 138* 96 95  BUN 26* 25* 28* 32*  CREATININE 1.36* 1.26* 1.41* 1.45*  CALCIUM 8.5* 8.5* 8.4* 8.4*  MG  --   --  1.6* 1.8   GFR: Estimated Creatinine Clearance: 44.1 mL/min (A) (by C-G formula based on SCr of 1.45 mg/dL (H)). Recent Labs  Lab 12/01/19 1915 12/01/19 2019 12/02/19 1032 12/02/19 1037 12/03/19 0323 12/04/19 0124  PROCALCITON  --   --  0.18  --   --   --   WBC 31.9*  32.0*  --   --  24.4* 18.9* 15.3*  LATICACIDVEN  --  1.1 1.2  --   --   --     Liver Function Tests: Recent Labs  Lab 12/01/19 2019 12/02/19 1032 12/03/19 0323 12/04/19 0124  AST 25 19 22 25   ALT 17 17 18 21   ALKPHOS 83 74 80 85  BILITOT 1.3* 0.9 0.8 0.8  PROT 7.5 7.5 7.4 6.8  ALBUMIN 2.5* 2.6* 2.4* 2.3*   No results for input(s): LIPASE, AMYLASE in the last 168 hours. No results for input(s): AMMONIA in the last 168 hours.  ABG No results found for: PHART, PCO2ART, PO2ART, HCO3, TCO2, ACIDBASEDEF, O2SAT   Coagulation Profile: Recent Labs  Lab 12/01/19 2019  INR 1.2    Cardiac Enzymes: No results for input(s): CKTOTAL, CKMB, CKMBINDEX, TROPONINI in the last 168 hours.  HbA1C: Hemoglobin A1C  Date/Time Value Ref Range Status  06/12/2019 03:19 PM 5.2 4.0 - 5.6 % Final  01/17/2019 02:49 PM 5.7 (A) 4.0 - 5.6 % Final  04/25/2015 12:00  AM 7.7  Final   Hgb A1c MFr Bld  Date/Time Value Ref Range Status  12/02/2019 10:34 AM 4.7 (L) 4.8 - 5.6 % Final    Comment:    (NOTE) Pre diabetes:          5.7%-6.4%  Diabetes:              >6.4%  Glycemic control for   <7.0% adults with diabetes   09/02/2018 05:06 AM 5.2 4.8 - 5.6 % Final    Comment:    (NOTE) Pre diabetes:          5.7%-6.4% Diabetes:              >6.4% Glycemic control for   <7.0% adults with diabetes     CBG: No results for input(s): GLUCAP in the last 168 hours.  Rodman Pickle, M.D. [facility] Pulmonary/Critical Care Medicine 12/04/2019 12:56 PM

## 2019-12-05 LAB — CBC
HCT: 27.9 % — ABNORMAL LOW (ref 39.0–52.0)
Hemoglobin: 9.2 g/dL — ABNORMAL LOW (ref 13.0–17.0)
MCH: 26.9 pg (ref 26.0–34.0)
MCHC: 33 g/dL (ref 30.0–36.0)
MCV: 81.6 fL (ref 80.0–100.0)
Platelets: 212 10*3/uL (ref 150–400)
RBC: 3.42 MIL/uL — ABNORMAL LOW (ref 4.22–5.81)
RDW: 18.7 % — ABNORMAL HIGH (ref 11.5–15.5)
WBC: 16 10*3/uL — ABNORMAL HIGH (ref 4.0–10.5)
nRBC: 0.1 % (ref 0.0–0.2)

## 2019-12-05 LAB — BASIC METABOLIC PANEL
Anion gap: 8 (ref 5–15)
BUN: 33 mg/dL — ABNORMAL HIGH (ref 8–23)
CO2: 22 mmol/L (ref 22–32)
Calcium: 8.6 mg/dL — ABNORMAL LOW (ref 8.9–10.3)
Chloride: 103 mmol/L (ref 98–111)
Creatinine, Ser: 1.41 mg/dL — ABNORMAL HIGH (ref 0.61–1.24)
GFR, Estimated: 51 mL/min — ABNORMAL LOW (ref >60)
Glucose, Bld: 113 mg/dL — ABNORMAL HIGH (ref 70–99)
Potassium: 4.4 mmol/L (ref 3.5–5.1)
Sodium: 133 mmol/L — ABNORMAL LOW (ref 135–145)

## 2019-12-05 LAB — RHEUMATOID FACTOR: Rheumatoid fact SerPl-aCnc: 126.3 IU/mL — ABNORMAL HIGH (ref 0.0–13.9)

## 2019-12-05 MED ORDER — CLOPIDOGREL BISULFATE 75 MG PO TABS
75.0000 mg | ORAL_TABLET | Freq: Every day | ORAL | Status: DC
  Administered 2019-12-05 – 2019-12-06 (×2): 75 mg via ORAL
  Filled 2019-12-05 (×2): qty 1

## 2019-12-05 MED ORDER — FUROSEMIDE 40 MG PO TABS
40.0000 mg | ORAL_TABLET | Freq: Every day | ORAL | Status: DC
  Administered 2019-12-05 – 2019-12-06 (×2): 40 mg via ORAL
  Filled 2019-12-05: qty 1

## 2019-12-05 MED ORDER — ASPIRIN 81 MG PO CHEW
81.0000 mg | CHEWABLE_TABLET | Freq: Every day | ORAL | Status: DC
  Administered 2019-12-05 – 2019-12-06 (×2): 81 mg via ORAL
  Filled 2019-12-05 (×2): qty 1

## 2019-12-05 NOTE — Care Management Important Message (Signed)
Important Message  Patient Details  Name: STONEY KARCZEWSKI MRN: [id number] Date of Birth: 1940/04/12   Medicare Important Message Given:  Yes - Important Message mailed due to current National Emergency  Verbal consent obtained due to current National Emergency  Relationship to patient: Self Contact Name: Rashawd Laskaris Call Date: 12/05/19  Time: 0933 Phone: [phone] Outcome: No Answer/Busy Important Message mailed to: Patient address on file    Delorse Lek 12/05/2019, 9:33 AM

## 2019-12-05 NOTE — Plan of Care (Signed)
  Problem: Health Behavior/Discharge Planning: Goal: Ability to manage health-related needs will improve Outcome: Completed/Met   Problem: Clinical Measurements: Goal: Diagnostic test results will improve Outcome: Completed/Met Goal: Respiratory complications will improve Outcome: Completed/Met Goal: Cardiovascular complication will be avoided Outcome: Completed/Met   Problem: Nutrition: Goal: Adequate nutrition will be maintained Outcome: Completed/Met   Problem: Coping: Goal: Level of anxiety will decrease Outcome: Completed/Met   Problem: Elimination: Goal: Will not experience complications related to bowel motility Outcome: Completed/Met Goal: Will not experience complications related to urinary retention Outcome: Completed/Met   Problem: Safety: Goal: Ability to remain free from injury will improve Outcome: Completed/Met   Problem: Skin Integrity: Goal: Risk for impaired skin integrity will decrease Outcome: Completed/Met   Problem: Activity: Goal: Ability to tolerate increased activity will improve Outcome: Completed/Met   Problem: Clinical Measurements: Goal: Ability to maintain a body temperature in the normal range will improve Outcome: Completed/Met   Problem: Respiratory: Goal: Ability to maintain adequate ventilation will improve Outcome: Completed/Met Goal: Ability to maintain a clear airway will improve Outcome: Completed/Met

## 2019-12-05 NOTE — Evaluation (Signed)
Physical Therapy Evaluation Patient Details Name: Paul [name] MRN: [id number] DOB: 06-11-1940 Today's Date: 12/05/2019   History of Present Illness  79 y.o. 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 Found to have acute hypoxemic respiratory failure: In the setting of pulmonary fibrosis, severe sepsis  2/2 CAP & acute on chronic diastolic CHF.  Clinical Impression  Pt was seen for mobility on RW with cues for body mechanics and posture to increase safety and comfort with T12 fracture.  Pt has a great deal of injury from a fall decades ago, and would benefit from follow up PT to increase his safety with mobility.  If pt cannot have 24/7 help at home initially may need to be considered for rehab setting.  Note O2 sats at baseline were 95% and with gait on hallway were 85% on room air with pursed lip breathing to recover to 90+% range.    Follow Up Recommendations Home health PT;Supervision for mobility/OOB    Equipment Recommendations  Rolling walker with 5" wheels (if his is not in good repair)    Recommendations for Other Services       Precautions / Restrictions Precautions Precaution Comments: acute on chronic back pain, MRI on 12/04/19 revealed "Increased loss of height at the superior and inferior endplates of M84 since June 2021 radiograph with mild marrow edema. This is probably subacute." Restrictions Weight Bearing Restrictions: No      Mobility  Bed Mobility Overal bed mobility: Modified Independent             General bed mobility comments: extra time to sit up from sidelying    Transfers Overall transfer level: Needs assistance Equipment used: Rolling walker (2  wheeled);1 person hand held assist Transfers: Sit to/from Stand Sit to Stand: Min assist         General transfer comment: min assist to power up with pt getting up from bedside  Ambulation/Gait Ambulation/Gait assistance: Min guard Gait Distance (Feet): 50 Feet Assistive device: Rolling walker (2 wheeled);1 person hand held assist Gait Pattern/deviations: Step-through pattern;Decreased stride length;Wide base of support Gait velocity: reduced Gait velocity interpretation: <1.31 ft/sec, indicative of household ambulator General Gait Details: has hesitations at time over back pain twinges  Stairs            Wheelchair Mobility    Modified Rankin (Stroke Patients Only)       Balance Overall balance assessment: Needs assistance Sitting-balance support: Feet supported Sitting balance-Leahy Scale: Fair     Standing balance support: Bilateral upper extremity supported;During functional activity Standing balance-Leahy Scale: Poor                               Pertinent Vitals/Pain Pain Assessment: 0-10 Pain Score: 10-Worst pain ever Faces Pain Scale: Hurts whole lot Pain Location: back Pain Descriptors / Indicators: Guarding;Grimacing;Aching Pain Intervention(s): Limited activity within patient's tolerance;Monitored during session;Repositioned    Home Living Family/patient expects to be discharged to:: Private residence Living Arrangements: Spouse/significant other Available Help at Discharge: Family Type of Home: House Home Access: Stairs to enter   Technical brewer of Steps: 2 Home Layout: One level Home Equipment: Environmental consultant - 2 wheels      Prior  Function Level of Independence: Needs assistance   Gait / Transfers Assistance Needed: walked with no AD per pt but was in recliner a lot just recently  ADL's / Homemaking Assistance Needed: has his wife to assist him to get a bath but can bathe and dress alone, wife is working.  Wife cares for  house and meals  Comments: no walking out in community but keeps wife company on errands     Hand Dominance        Extremity/Trunk Assessment   Upper Extremity Assessment Upper Extremity Assessment: Defer to OT evaluation    Lower Extremity Assessment Lower Extremity Assessment: Generalized weakness    Cervical / Trunk Assessment Cervical / Trunk Assessment: Other exceptions (has back injury from decades ago, newer T12 compression frac)  Communication   Communication: No difficulties  Cognition Arousal/Alertness: Awake/alert Behavior During Therapy: WFL for tasks assessed/performed Overall Cognitive Status: Within Functional Limits for tasks assessed                                        General Comments General comments (skin integrity, edema, etc.): pt is limited by pain but is wanting to go directly home.  He is unable to stand up alone, needed help to get legs onto bed.  Will be impractical with wife working to go home without 24/7 help    Exercises     Assessment/Plan    PT Assessment Patient needs continued PT services  PT Problem List Decreased strength;Decreased range of motion;Decreased activity tolerance;Decreased balance;Decreased mobility;Decreased coordination;Decreased safety awareness;Pain       PT Treatment Interventions DME instruction;Gait training;Stair training;Functional mobility training;Therapeutic activities;Therapeutic exercise;Balance training;Neuromuscular re-education;Patient/family education    PT Goals (Current goals can be found in the Care Plan section)  Acute Rehab PT Goals Patient Stated Goal: to get his back pain under control PT Goal Formulation: With patient Time For Goal Achievement: 12/19/19 Potential to Achieve Goals: Good    Frequency Min 3X/week   Barriers to discharge Inaccessible home environment;Decreased caregiver support home alone wtih steps to enter    Co-evaluation               AM-PAC  PT "6 Clicks" Mobility  Outcome Measure Help needed turning from your back to your side while in a flat bed without using bedrails?: A Little Help needed moving from lying on your back to sitting on the side of a flat bed without using bedrails?: A Little Help needed moving to and from a bed to a chair (including a wheelchair)?: A Little Help needed standing up from a chair using your arms (e.g., wheelchair or bedside chair)?: A Little Help needed to walk in hospital room?: A Little Help needed climbing 3-5 steps with a railing? : A Lot 6 Click Score: 17    End of Session Equipment Utilized During Treatment: Gait belt Activity Tolerance: Patient limited by fatigue;Patient limited by pain Patient left: in bed;with call bell/phone within reach;with bed alarm set Nurse Communication: Mobility status PT Visit Diagnosis: Unsteadiness on feet (R26.81);Muscle weakness (generalized) (M62.81);Pain Pain - Right/Left:  (spine) Pain - part of body:  (spine)    Time: 5284-1324 PT Time Calculation (min) (ACUTE ONLY): 24 min   Charges:   PT Evaluation $PT Eval Moderate Complexity: 1 Mod PT Treatments $Gait Training: 8-22 mins       Ramond Dial 12/05/2019, 11:58 AM  Mee Hives,  PT MS Acute Rehab Dept. Number: [place] [facility] [place]

## 2019-12-05 NOTE — Progress Notes (Signed)
SATURATION QUALIFICATIONS: (This note is used to comply with regulatory documentation for home oxygen)  Patient Saturations on Room Air at Rest = 95%  Patient Saturations on Room Air while Ambulating = 85%  Patient Saturations on 0 Liters of oxygen while Ambulating =NT%  Please briefly explain why patient needs home oxygen:  Will need to be assessed for use of O2 to get a level of effective O2.  Mee Hives, PT MS Acute Rehab Dept. Number: [place] [facility] [place]

## 2019-12-05 NOTE — Evaluation (Addendum)
Occupational Therapy Evaluation Patient Details Name: Paul [name] MRN: [id number] DOB: 04-13-1940 Today's Date: 12/05/2019    History of Present Illness 79 y.o. 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 Found to have acute hypoxemic respiratory failure: In the setting of pulmonary fibrosis, severe sepsis  2/2 CAP & acute on chronic diastolic CHF.   Clinical Impression   Pt admitted with the above diagnoses and presents with below problem list. Pt will benefit from continued acute OT to address the below listed deficits and maximize independence with basic ADLs. PTA pt was setup to mod I with ADLs. Pt is currently min guard A with LB ADLs and functional transfers/mobility. Pt noted to seek external support walking in the room. Discussed potential benefit of rw both for balance and reducing back pain via offloading, pt ultimately declined rw. Discussed potential need for supplemental O2 for walking/OOB activity per PT note and report of O2 dropping to 85 while walking in the hall on RA earlier. Pt on RA throughout OT session, O2 assessed at rest after walking a short distance in the room (bathroom and back to EOB), O2 96.    Follow Up Recommendations  Home health OT;Supervision - Intermittent (OOB/mobility)    Equipment Recommendations   (Pt declined 3n1.)    Recommendations for Other Services       Precautions / Restrictions Precautions Precaution Comments: acute on chronic back pain, MRI on 12/04/19 revealed "Increased loss of height at the superior and inferior endplates of I71 since June 2021 radiograph with mild marrow edema. This is probably subacute." Restrictions Weight Bearing  Restrictions: No      Mobility Bed Mobility Overal bed mobility: Modified Independent             General bed mobility comments: extra time to sit up from sidelying    Transfers Overall transfer level: Needs assistance Equipment used: None Transfers: Sit to/from Stand Sit to Stand: Min guard         General transfer comment: min guard for safety. does seek external support of legs pressed into bed vs reaching for single extremity external support.     Balance Overall balance assessment: Needs assistance Sitting-balance support: Feet supported Sitting balance-Leahy Scale: Fair     Standing balance support: Single extremity supported;During functional activity Standing balance-Leahy Scale: Poor Standing balance comment: seeks external support for balance, furniture utilized for support. Pt declined use of walker.                            ADL either performed or assessed with clinical judgement   ADL Overall ADL's : Needs assistance/impaired Eating/Feeding: Set up;Sitting   Grooming: Set up;Sitting   Upper Body Bathing: Set up;Sitting   Lower Body Bathing: Min guard;Sit to/from stand   Upper Body Dressing : Set up;Sitting   Lower Body Dressing: Min guard;Sit to/from stand   Toilet Transfer: Min guard;Ambulation;Supervision/safety   Toileting- Water quality scientist and Hygiene: Min guard;Sit to/from stand;Sitting/lateral lean;Set up   Tub/ Shower Transfer: Min guard;Tub transfer;Ambulation;Grab bars   Functional mobility during ADLs: Min guard General ADL Comments: Pt completed bed mobility and functional mobility in the room. min guard for transfers and in room mobility. Pt would  likely benefit from use of rw at least for managing back, discussed this with pt but he declined use of walker.      Vision         Perception     Praxis      Pertinent Vitals/Pain Pain Assessment: Faces Pain Score: 10-Worst pain ever Faces Pain Scale: Hurts  whole lot Pain Location: back Pain Descriptors / Indicators: Constant;Guarding;Aching;Sore;Grimacing Pain Intervention(s): Limited activity within patient's tolerance;Monitored during session;Repositioned (pt declined heat packs)     Hand Dominance     Extremity/Trunk Assessment Upper Extremity Assessment Upper Extremity Assessment: Generalized weakness   Lower Extremity Assessment Lower Extremity Assessment: Defer to PT evaluation   Cervical / Trunk Assessment Cervical / Trunk Assessment: Other exceptions (has back injury from decades ago, newer T12 compression frac)   Communication Communication Communication: No difficulties   Cognition Arousal/Alertness: Awake/alert Behavior During Therapy: WFL for tasks assessed/performed Overall Cognitive Status: Within Functional Limits for tasks assessed                                     General Comments  Pt walked in the room during OT session. O2 96 at rest at end of OT session. RA throughout session. Discussed drop in O2 while ambualting further with PT earlier today and possible need for O2 with OOB activity. Also encouraged incentive spirometer exercises.      Exercises Exercises: Other exercises (LE strength is 4- to 4+)   Shoulder Instructions      Home Living Family/patient expects to be discharged to:: Private residence Living Arrangements: Spouse/significant other Available Help at Discharge: Family Type of Home: House Home Access: Stairs to enter Technical brewer of Steps: 2   Home Layout: One level     Bathroom Shower/Tub: Teacher, early years/pre: Standard     Home Equipment: Environmental consultant - 2 wheels;Grab bars - tub/shower          Prior Functioning/Environment Level of Independence: Needs assistance  Gait / Transfers Assistance Needed: mod I to independent. mod I for extra time and suspect seeks single extremity support at baseline.  ADL's / Homemaking Assistance Needed: wife  provides some setup assistance, supervision for tub shower transfer. No physical assist needed at baseline. Spouse completes household IADLs (meal prep, cleaning, etc)   Comments: does not drive. household ambulator. rides along on trips to grocery store but stays in the car while wife shops.         OT Problem List: Decreased strength;Decreased activity tolerance;Impaired balance (sitting and/or standing);Decreased knowledge of use of DME or AE;Decreased knowledge of precautions;Cardiopulmonary status limiting activity;Pain      OT Treatment/Interventions: Self-care/ADL training;Therapeutic exercise;Energy conservation;DME and/or AE instruction;Therapeutic activities;Patient/family education;Balance training    OT Goals(Current goals can be found in the care plan section) Acute Rehab OT Goals Patient Stated Goal: to get his back pain under control OT Goal Formulation: With patient Time For Goal Achievement: 12/19/19 Potential to Achieve Goals: Good ADL Goals Pt Will Perform Tub/Shower Transfer: Tub transfer;with supervision;ambulating Additional ADL Goal #1: Pt will independently verbalize 2 energy conservation strategies to incorporate into ADLs.  OT Frequency: Min 2X/week   Barriers to D/C:    spouse works        Co-evaluation              AM-PAC OT "6 Clicks" Daily Activity     Outcome Measure Help from another  person eating meals?: None Help from another person taking care of personal grooming?: None Help from another person toileting, which includes using toliet, bedpan, or urinal?: A Little Help from another person bathing (including washing, rinsing, drying)?: A Little Help from another person to put on and taking off regular upper body clothing?: None Help from another person to put on and taking off regular lower body clothing?: A Little 6 Click Score: 21   End of Session    Activity Tolerance: Patient limited by pain;Patient limited by fatigue;Patient  tolerated treatment well Patient left: in bed;with call bell/phone within reach  OT Visit Diagnosis: Unsteadiness on feet (R26.81);Muscle weakness (generalized) (M62.81);Pain                Time: 2297-9892 OT Time Calculation (min): 13 min Charges:  OT General Charges $OT Visit: 1 Visit OT Evaluation $OT Eval Low Complexity: [place], OT Acute Rehabilitation Services Pager: [phone] Office: [phone]   Hortencia Pilar 12/05/2019, 1:12 PM

## 2019-12-05 NOTE — Progress Notes (Signed)
PROGRESS NOTE    Paul [name]  ZOX:[id number] DOB: 06-09-1940 DOA: 12/01/2019 PCP: Denita Lung, MD   Brief Narrative:  Paul [name] is a 79 y.o. 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  ED course: Patient afebrile, tachypneic, hypoxic with oxygen saturation 88 to 90% on room air placed on 2 L supplemental oxygen, blood pressure elevated upon arrival.  Subsequent reading significantly improved without administration of any antihypertensive medication.  WBC 31.9 with left shift, H&H 11.2/34.9, platelet: 258, sodium 131, potassium: 4.7 bicarb: 18, lactic acid: 1.1, INR: 1.2, troponin: Negative.  EKG without acute ischemic changes.  D-dimer: 1.19, UA and urine culture, blood culture: Pending, COVID-19 negative.  Influenza panel: Negative.  Chest x-ray shows moderate severity bilateral multifocal infiltrate and small bilateral pleural effusion.  CTA chest came back negative for PE.  Consistent with granulomatous disease and progressive pulmonary fibrosis.  Interlobular septal thickening with bilateral groundglass airspace opacities.  Findings concerning for pulmonary edema versus atypical infectious process.  Patient was given Rocephin and azithromycin and IV fluid as per sepsis protocol in the ED and admitted for further management of sepsis and acute hypoxemic respiratory failure likely secondary to pneumonia versus fluid overload.  Assessment & Plan:  Acute hypoxemic respiratory failure: In the setting of pulmonary fibrosis, severe sepsis  2/2 CAP & acute on chronic diastolic CHF: Improving -Patient tachypneic, WBC more than 30,000, hypoxic upon arrival.  Meet sepsis criteria Afebrile with  normal lactic acid  -Reviewed chest x-ray and CTA chest, COVID-19 negative - WBC trended down from 30,000-18,000-15.3--16 this morning. -Continue antibiotics for coverage of bacterial pneumonia including ceftriaxone and azithromycin.  -BNP elevated from baseline-610 -Repeat transthoracic echo-shows ejection fraction of 55 to 81%, grade 2 diastolic dysfunction, moderate to severe AR. -Strict INO's and daily weight. -Changed IV Lasix BID to PO daily -Appreciate PCCM help -CRP and sed rate: Mildly elevated.  RF: Very high 126. - Will check patient's oxygen saturation while walking on the day of discharge.  Follow-up with PCCM outpatient. -Now on room air.  DC IV antibiotics tomorrow.  Anticipate discharge tomorrow.  Hemoptysis: Improving -Multifactorial-CAP versus bronchitis -PT/INR, APTT: WNL -Monitor H&H.   -Appreciate PCCM help-recommended no bronchoscopy given immunocompromised status (history of RA).  Outpatient follow-up with PCCM -Discussed with patient-we will resume aspirin and Plavix today  AKI on CKD stage IIIb: Creatinine: 1.41, GFR: 51 trended down from 59. -Avoid nephrotoxic medication. -Decrease Lasix dose from twice daily to once daily  Mild hyponatremia: Likely in the setting of IV diuresis.  Patient is asymptomatic.  Sodium: 133  Normal anion gap metabolic acidosis: Resolved Bicarb 18--improved to 22, anion gap 8.  Lactic acid normal.   CAD with stent: Stable.  No ACS symptoms. -Aspirin and Plavix was hold due to hemoptysis-resumed today. -Continue  statin and metoprolol.   Liver cirrhosis: -Noted on CTA chest.  -PT/INR: WNL, liver enzymes: WNL.  Mildly elevated total bilirubin: 1.3.   -Hepatitis panel: Negative  Diet controlled type 2 diabetes -A1c 4.7%.  Hypertension: Stable -Continue home meds metoprolol, amlodipine and Imdur.  Monitor blood pressure closely.    Hyperlipidemia -Continue Zocor  Rheumatoid arthritis -On weekly Enbrel injections  (every Wednesday).  Continue leflunomide.  MDS/MPN/anemia of chronic disease: -Followed by  oncology outpatient -Gets epoetin injections every 10 days  -H&H between 11-9.  Hypomagnesemia: Replenished.    Gout -Continue febuxostat  Chronic lower back pain: -Patient complaining of severe back pain and requested for MRI of lumbar spine. -MRI shows increased loss of height at the superior inferior endplates of K93 since June 2021.  This is probably subacute.  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  No high-grade stenosis. -PT/OT recommended home health PT/OT -Outpatient follow-up with PCP & spinal surgery -Tylenol/oxycodone as needed for pain control  DVT prophylaxis: SCD, no chemical prophylaxis due to hemoptysis. Code Status: Full code Family Communication:None present at bedside.  Plan of care discussed with patient in length and he verbalized understanding and agreed with it. Disposition Plan: Home Consultants:  PCCM Procedures:   CT chest  Echo  MRI lumbar spine  Antimicrobials:   Rocephin  Azithromycin  Status is: Inpatient   Dispo: The patient is from: Home with home health              Anticipated d/c is to: Home              Anticipated d/c date is: 12/06/2019              Patient currently not medically stable for the discharge.   Subjective: Patient seen and examined.  Resting comfortably on the bed.  Tells me that his shortness of breath and hemoptysis has improved.  He continues to have lower back pain.  Tells me that he is not comfortable going home today.  Objective: Vitals:   12/04/19 1748 12/04/19 2008 12/05/19 0450 12/05/19 0926  BP: (!) 128/51 (!) 153/39 (!) 165/61 (!) 146/54  Pulse: (!) 53 (!) 57 68 62  Resp: 18 20 18 18   Temp: 97.6 F (36.4 C) 97.6 F (36.4 C) 98 F (36.7 C) 98.6 F (37 C)  TempSrc: Oral Oral Oral   SpO2: 97% 97% 94% 96%  Weight:      Height:        Intake/Output Summary (Last 24  hours) at 12/05/2019 1512 Last data filed at 12/05/2019 1300 Gross per 24 hour  Intake 740 ml  Output 1300 ml  Net -560 ml   Filed Weights   12/01/19 1926 12/02/19 1931  Weight: 84.4 kg 86.7 kg    Examination:  General exam: Appears calm and comfortable, on room air, communicating well Respiratory system: Clear to auscultation. Respiratory effort normal. Cardiovascular system: S1 & S2 heard, RRR. No JVD, murmurs, rubs, gallops or clicks. No pedal edema. Gastrointestinal system: Abdomen is nondistended, soft and nontender. No organomegaly or masses felt. Normal bowel sounds heard. Central nervous system: Alert and oriented. No focal neurological deficits. Extremities: Symmetric 5 x 5 power. Skin: No rashes, lesions or ulcers. Psychiatry: Judgement and insight appear normal. Mood & affect appropriate.  Data Reviewed: I have personally reviewed following labs and imaging studies  CBC: Recent Labs  Lab 11/29/19 1319 11/29/19 1319 12/01/19 1915 12/02/19 1037 12/03/19 0323 12/04/19 0124 12/05/19 0138  WBC 16.2*  --  31.9*   32.0* 24.4* 18.9* 15.3* 16.0*  NEUTROABS 10.3*  --  21.0*  --  14.6*  --   --   HGB 10.0*  --  11.2*   11.0* 9.4* 8.7* 8.7* 9.2*  HCT 30.3*   < > 34.9*   34.7* 29.2* 26.9* 26.8* 27.9*  MCV 81.2   < > 82.9   82.4 83.0 82.0 82.0 81.6  PLT 214  --  258  275 222 197 198 212   < > = values in this interval not displayed.   Basic Metabolic Panel: Recent Labs  Lab 12/01/19 1915 12/02/19 1032 12/03/19 0323 12/04/19 0124 12/05/19 0138  NA 131* 133* 135 132* 133*  K 4.7 4.6 4.6 4.2 4.4  CL 105 107 107 102 103  CO2 18* 21* 22 22 22   GLUCOSE 130* 138* 96 95 113*  BUN 26* 25* 28* 32* 33*  CREATININE 1.36* 1.26* 1.41* 1.45* 1.41*  CALCIUM 8.5* 8.5* 8.4* 8.4* 8.6*  MG  --   --  1.6* 1.8  --    GFR: Estimated Creatinine Clearance: 45.3 mL/min (A) (by C-G formula based on SCr of 1.41 mg/dL (H)). Liver Function Tests: Recent Labs  Lab 12/01/19 2019  12/02/19 1032 12/03/19 0323 12/04/19 0124  AST 25 19 22 25   ALT 17 17 18 21   ALKPHOS 83 74 80 85  BILITOT 1.3* 0.9 0.8 0.8  PROT 7.5 7.5 7.4 6.8  ALBUMIN 2.5* 2.6* 2.4* 2.3*   No results for input(s): LIPASE, AMYLASE in the last 168 hours. No results for input(s): AMMONIA in the last 168 hours. Coagulation Profile: Recent Labs  Lab 12/01/19 2019  INR 1.2   Cardiac Enzymes: No results for input(s): CKTOTAL, CKMB, CKMBINDEX, TROPONINI in the last 168 hours. BNP (last 3 results) No results for input(s): PROBNP in the last 8760 hours. HbA1C: No results for input(s): HGBA1C in the last 72 hours. CBG: No results for input(s): GLUCAP in the last 168 hours. Lipid Profile: No results for input(s): CHOL, HDL, LDLCALC, TRIG, CHOLHDL, LDLDIRECT in the last 72 hours. Thyroid Function Tests: No results for input(s): TSH, T4TOTAL, FREET4, T3FREE, THYROIDAB in the last 72 hours. Anemia Panel: No results for input(s): VITAMINB12, FOLATE, FERRITIN, TIBC, IRON, RETICCTPCT in the last 72 hours. Sepsis Labs: Recent Labs  Lab 12/01/19 2019 12/02/19 1032  PROCALCITON  --  0.18  LATICACIDVEN 1.1 1.2    Recent Results (from the past 240 hour(s))  Blood Culture (routine x 2)     Status: None (Preliminary result)   Collection Time: 12/01/19  9:06 PM   Specimen: BLOOD LEFT ARM  Result Value Ref Range Status   Specimen Description BLOOD LEFT ARM  Final   Special Requests   Final    BOTTLES DRAWN AEROBIC AND ANAEROBIC Blood Culture adequate volume   Culture   Final    NO GROWTH 4 DAYS Performed at [facility], [place] [address] [place], [place] [address]    Report Status PENDING  Incomplete  Blood Culture (routine x 2)     Status: None (Preliminary result)   Collection Time: 12/01/19  9:26 PM   Specimen: BLOOD LEFT HAND  Result Value Ref Range Status   Specimen Description BLOOD LEFT HAND  Final   Special Requests   Final    BOTTLES DRAWN AEROBIC AND ANAEROBIC Blood Culture  adequate volume   Culture   Final    NO GROWTH 4 DAYS Performed at [facility], [place] [address] [place], [place] [address]    Report Status PENDING  Incomplete  Respiratory Panel by RT PCR (Flu A&B, Covid) - Nasopharyngeal Swab     Status: None   Collection Time: 12/01/19  9:32 PM   Specimen: Nasopharyngeal Swab  Result Value Ref Range Status   SARS Coronavirus 2 by RT PCR NEGATIVE NEGATIVE Final    Comment: (NOTE) SARS-CoV-2 target nucleic acids are NOT DETECTED.  The SARS-CoV-2 RNA  is generally detectable in upper respiratoy specimens during the acute phase of infection. The lowest concentration of SARS-CoV-2 viral copies this assay can detect is 131 copies/mL. A negative result does not preclude SARS-Cov-2 infection and should not be used as the sole basis for treatment or other patient management decisions. A negative result may occur with  improper specimen collection/handling, submission of specimen other than nasopharyngeal swab, presence of viral mutation(s) within the areas targeted by this assay, and inadequate number of viral copies (<131 copies/mL). A negative result must be combined with clinical observations, patient history, and epidemiological information. The expected result is Negative.  Fact Sheet for Patients:  PinkCheek.be  Fact Sheet for Healthcare Providers:  GravelBags.it  This test is no t yet approved or cleared by the [place] FDA and  has been authorized for detection and/or diagnosis of SARS-CoV-2 by FDA under an Emergency Use Authorization (EUA). This EUA will remain  in effect (meaning this test can be used) for the duration of the COVID-19 declaration under Section 564(b)(1) of the Act, 21 U.S.C. section 360bbb-3(b)(1), unless the authorization is terminated or revoked sooner.     Influenza A by PCR NEGATIVE NEGATIVE Final   Influenza B by PCR NEGATIVE NEGATIVE Final     Comment: (NOTE) The Xpert Xpress SARS-CoV-2/FLU/RSV assay is intended as an aid in  the diagnosis of influenza from Nasopharyngeal swab specimens and  should not be used as a sole basis for treatment. Nasal washings and  aspirates are unacceptable for Xpert Xpress SARS-CoV-2/FLU/RSV  testing.  Fact Sheet for Patients: PinkCheek.be  Fact Sheet for Healthcare Providers: GravelBags.it  This test is not yet approved or cleared by the [place] FDA and  has been authorized for detection and/or diagnosis of SARS-CoV-2 by  FDA under an Emergency Use Authorization (EUA). This EUA will remain  in effect (meaning this test can be used) for the duration of the  Covid-19 declaration under Section 564(b)(1) of the Act, 21  U.S.C. section 360bbb-3(b)(1), unless the authorization is  terminated or revoked. Performed at [facility], [place] [address] [place], [place] [address]   Urine culture     Status: None   Collection Time: 12/02/19 12:07 AM   Specimen: In/Out Cath Urine  Result Value Ref Range Status   Specimen Description IN/OUT CATH URINE  Final   Special Requests NONE  Final   Culture   Final    NO GROWTH Performed at [facility], [place] [address] [place], [place] [address]    Report Status 12/03/2019 FINAL  Final      Radiology Studies: MR Lumbar Spine W Wo Contrast  Result Date: 12/04/2019 CLINICAL DATA:  Low back pain EXAM: MRI LUMBAR SPINE WITHOUT AND WITH CONTRAST TECHNIQUE: Multiplanar and multiecho pulse sequences of the lumbar spine were obtained without and with intravenous contrast. CONTRAST:  8.47mL GADAVIST GADOBUTROL 1 MMOL/ML IV SOLN COMPARISON:  None. FINDINGS: Segmentation:  Standard. Alignment:  Trace retrolisthesis at L2-L3. Vertebrae: Chronic compression fractures and loss of height at L2-L4. Loss of height at the superior and inferior endplates of U76 appears increased (less than 50%) since June  2021 radiograph. There is mild marrow edema present. Marrow edema at the superior endplate of L1 is likely on a degenerative basis. Diffusely decreased T1 marrow signal may reflect reported history of anemia. Conus medullaris and cauda equina: Conus extends to the L1-L2 level. Conus and cauda equina appear normal. Paraspinal and other soft tissues: Unremarkable. Disc levels: Congenital  narrowing of the spinal canal. L1-L2:  No canal or foraminal stenosis. L2-L3: Disc bulge with superimposed central disc extrusion extending inferiorly to the mid L3 level. Mild canal stenosis. Slight effacement of the lateral recesses. No foraminal stenosis. L3-L4: Disc bulge. Mild facet arthropathy. Mild canal stenosis. Slight effacement of the lateral recesses. No right foraminal stenosis. Mild left foraminal stenosis. L4-L5: Disc bulge with endplate osteophytic ridging. Marked facet arthropathy with ligamentum flavum infolding. Mild to moderate canal stenosis. Partial effacement of the lateral recesses. Mild foraminal stenosis. L5-S1: Disc bulge with endplate osteophytic ridging. Marked facet arthropathy. Mild canal stenosis. Partial effacement of the lateral recesses. Mild foraminal stenosis. IMPRESSION: Increased loss of height at the superior and inferior endplates of U11 since June 2021 radiograph with mild marrow edema. This is probably subacute. 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 No high-grade stenosis. Marked lower lumbar facet arthropathy. Electronically Signed   By: Macy Mis M.D.   On: 12/04/2019 16:51    Scheduled Meds:  amLODipine  5 mg Oral Daily   febuxostat  40 mg Oral Daily   furosemide  40 mg Oral Daily   isosorbide mononitrate  30 mg Oral Daily   isosorbide mononitrate  60 mg Oral QHS   leflunomide  20 mg Oral Daily   metoprolol tartrate  25 mg Oral BID   simvastatin  20 mg Oral QHS   Continuous Infusions:  sodium chloride 500 mL (12/03/19 2154)    azithromycin 500 mg (12/04/19 2246)   cefTRIAXone (ROCEPHIN)  IV 2 g (12/04/19 2240)     LOS: 4 days   Time spent: 40 minutes  Verneice Caspers Loann Quill, MD [facility]  If 7PM-7AM, please contact night-coverage [email or web address] 12/05/2019, 3:12 PM

## 2019-12-06 ENCOUNTER — Telehealth: Payer: Self-pay | Admitting: Family Medicine

## 2019-12-06 LAB — CULTURE, BLOOD (ROUTINE X 2)
Culture: NO GROWTH
Culture: NO GROWTH
Special Requests: ADEQUATE
Special Requests: ADEQUATE

## 2019-12-06 LAB — CYCLIC CITRUL PEPTIDE ANTIBODY, IGG/IGA: CCP Antibodies IgG/IgA: >250 units — ABNORMAL HIGH (ref 0–19)

## 2019-12-06 MED ORDER — ISOSORBIDE MONONITRATE ER 60 MG PO TB24
60.0000 mg | ORAL_TABLET | Freq: Every day | ORAL | 0 refills | Status: DC
Start: 2019-12-06 — End: 2020-04-15

## 2019-12-06 MED ORDER — FUROSEMIDE 40 MG PO TABS
40.0000 mg | ORAL_TABLET | Freq: Every day | ORAL | 0 refills | Status: DC
Start: 2019-12-07 — End: 2020-03-13

## 2019-12-06 NOTE — Plan of Care (Signed)
°  Problem: Clinical Measurements: Goal: Ability to maintain clinical measurements within normal limits will improve Outcome: Adequate for Discharge Goal: Will remain free from infection Outcome: Adequate for Discharge

## 2019-12-06 NOTE — Plan of Care (Signed)
°  Problem: Clinical Measurements: Goal: Ability to maintain clinical measurements within normal limits will improve 12/06/2019 1417 by Roma Kayser, RN Outcome: Adequate for Discharge 12/06/2019 1416 by Roma Kayser, RN Outcome: Adequate for Discharge Goal: Will remain free from infection 12/06/2019 1417 by Roma Kayser, RN Outcome: Adequate for Discharge 12/06/2019 1416 by Roma Kayser, RN Outcome: Adequate for Discharge

## 2019-12-06 NOTE — Progress Notes (Signed)
SATURATION QUALIFICATIONS: (This note is used to comply with regulatory documentation for home oxygen)  Patient Saturations on Room Air at Rest = 92%  Patient Saturations on Room Air while Ambulating = 85%  Patient Saturations on 2 Liters of oxygen while Ambulating = 91%  Please briefly explain why patient needs home oxygen: Pt 02 saturation dropped to 85% during activity.

## 2019-12-06 NOTE — TOC Initial Note (Signed)
Transition of Care [facility]) - Initial/Assessment Note    Patient Details  Name: Paul [name] MRN: [id number] Date of Birth: 07-Oct-1940  [facility] CM/SW Contact:    Bartholomew Crews, RN Phone Number: [phone] 12/06/2019, 10:33 AM  Clinical Narrative:                  Spoke with patient at the bedside to discuss transition plans. Patient sitting on side of bed on RA.   Stated that he is home with his wife who is able to assist him if needed. Stated that he has a walker at home. He will follow up with his PCP after discharge. Confirmed PCP in Epic correct. Patient stated that his sister lives 4/5 blocks from him, and she will be the one to provide transport home at discharge.   Discussed recommendations for [facility] therapy. Patient states that he does not need therapy, he will do his own therapy.   Discussed if he has a nurse from [facility] call him. He stated that he does not want anyone to call him.   TOC following for transition needs.   Expected Discharge Plan: Home/Self Care Barriers to Discharge: Continued Medical Work up   Patient Goals and CMS Choice Patient states their goals for this hospitalization and ongoing recovery are:: return home stated he does not need any therapy, he will do his own CMS Medicare.gov Compare Post Acute Care list provided to:: Patient Choice offered to / list presented to : Patient  Expected Discharge Plan and Services Expected Discharge Plan: Home/Self Care In-house Referral: NA Discharge Planning Services: CM Consult Post Acute Care Choice: [place] arrangements for the past 2 months: Single Family Home                 DME Arranged: N/A DME Agency: NA       HH Arranged: Refused [place] Agency: NA        Prior Living Arrangements/Services Living arrangements for the past 2 months: Single Family Home Lives with:: Self, Spouse Patient language and need for interpreter reviewed:: Yes Do you feel safe going back to the  place where you live?: Yes      Need for Family Participation in Patient Care: Yes (Comment) Care giver support system in place?: Yes (comment) Current home services: DME (walker) Criminal Activity/Legal Involvement Pertinent to Current Situation/Hospitalization: No - Comment as needed  Activities of Daily Living Home Assistive Devices/Equipment: None ADL Screening (condition at time of admission) Patient's cognitive ability adequate to safely complete daily activities?: Yes Is the patient deaf or have difficulty hearing?: No Does the patient have difficulty seeing, even when wearing glasses/contacts?: No Does the patient have difficulty concentrating, remembering, or making decisions?: No Patient able to express need for assistance with ADLs?: Yes Does the patient have difficulty dressing or bathing?: No Independently performs ADLs?: Yes (appropriate for developmental age) Does the patient have difficulty walking or climbing stairs?: No Weakness of Legs: Both Weakness of Arms/Hands: None  Permission Sought/Granted                  Emotional Assessment Appearance:: Appears stated age Attitude/Demeanor/Rapport: Engaged Affect (typically observed): Accepting Orientation: : Oriented to Self, Oriented to  Time, Oriented to Place, Oriented to Situation Alcohol / Substance Use: Not Applicable Psych Involvement: No (comment)  Admission diagnosis:  CAP (community acquired pneumonia) [J18.9] Community acquired pneumonia, unspecified laterality [J18.9] Sepsis, due to unspecified organism, unspecified whether acute organ dysfunction present [facility]) [  A41.9] Patient Active Problem List   Diagnosis Date Noted  . CAP (community acquired pneumonia) 12/01/2019  . Sepsis ([place]) 12/01/2019  . Acute hypoxemic respiratory failure ([place]) 12/01/2019  . Hemoptysis 12/01/2019  . Hyponatremia 12/01/2019  . Onychomycosis 03/13/2019  . History of amputation of great toe ([place]) 03/08/2019  . Current  smoker 01/17/2019  . MDS/MPN (myelodysplastic/myeloproliferative neoplasms) ([place]) 01/02/2018  . Unintentional weight loss 09/27/2017  . 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 Hyperlipidemia associated with type 2 diabetes mellitus ([place]) 01/21/2017  . Type 2 diabetes mellitus with diabetic neuropathy, without long-term current use of insulin ([place]) 01/21/2017  . Diabetic nephropathy associated with type 2 diabetes mellitus ([place]) 01/21/2017  . 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 Thrombocytopenia due to drugs   . Allergy status to unspecified drugs, medicaments and biological substances status   . AAA (abdominal aortic aneurysm) ([name])-infrarenal 3.4 cm 12/04/2015  . Vitamin B12 deficiency 04/22/2015  . Anemia of chronic disease 04/18/2015  . Hypertension complicating diabetes ([place]) 07/22/2010  . CAD (coronary artery disease) 09/17/2008   PCP:  Denita Lung, MD Pharmacy:   [facility] [place], [place] - [address] [place] [address] Phone: [phone] Fax: [phone]  [place], [address], [address] [name], [address] [place] [address] Phone: [phone] Fax: [phone]     Social Determinants of Health (SDOH) Interventions    Readmission Risk Interventions No flowsheet data found.

## 2019-12-06 NOTE — TOC Progression Note (Signed)
Transition of Care [facility]) - Progression Note    Patient Details  Name: KRATOS RUSCITTI MRN: [id number] Date of Birth: 01-07-1941  Transition of Care [facility]) CM/SW Contact  Bartholomew Crews, RN Phone Number: [phone] 12/06/2019, 12:34 PM  Clinical Narrative:     Notified by nursing of patient needing home oxygen. Pulm note verified in progress notes. Patient will need DME oxygen order. MD notification pending. Spoke with patient at the bedside to advise of home oxygen need. Patient is agreeable. Discussed choice of DME agency. Referral sent to [place]. TOC following for transition needs.   Expected Discharge Plan: Home/Self Care Barriers to Discharge: Continued Medical Work up  Expected Discharge Plan and Services Expected Discharge Plan: Home/Self Care In-house Referral: NA Discharge Planning Services: CM Consult Post Acute Care Choice: Durable Medical Equipment Living arrangements for the past 2 months: Single Family Home                 DME Arranged: Oxygen DME Agency: [facility] Date DME Agency Contacted: 12/06/19 Time DME Agency Contacted: 81 Representative spoke with at DME Agency: Ashly Reddell: Refused [place] Agency: NA         Social Determinants of Health ([place]) Interventions    Readmission Risk Interventions No flowsheet data found.

## 2019-12-06 NOTE — Progress Notes (Signed)
Physical Therapy Treatment Patient Details Name: DAYQUAN BUYS MRN: [id number] DOB: Sep 29, 1940 Today's Date: 12/06/2019    History of Present Illness 79 y.o. 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 Found to have acute hypoxemic respiratory failure: In the setting of pulmonary fibrosis, severe sepsis  2/2 CAP & acute on chronic diastolic CHF.    PT Comments    Patient received in bed, reports he had just walked with nursing but eventually able to be encouraged to walk again with therapy. Able to mobilize on a min guard level without RW but self limiting due to back pain- verbally reports high levels of pain but behaviors inconsistent with reported pain levels. When we got back to his room stated "I hope you realize what you just put me through" but then said that "pain is not any worse than it was when I was laying in bed". SPO2 drop to 86% on room air but able to slowly recover to 90s with rest. Educated on pulse ox/benefits of getting device for home use/oxygen monitoring. Left sitting at EOB per his request with all needs met, bed alarm active.     Follow Up Recommendations  Home health PT;Supervision for mobility/OOB     Equipment Recommendations  Rolling walker with 5" wheels    Recommendations for Other Services       Precautions / Restrictions Precautions Precautions: Fall;Back Precaution Comments: back precautions for comfort Restrictions Weight Bearing Restrictions: No    Mobility  Bed Mobility Overal bed mobility: Modified Independent             General bed mobility comments: occasional cues for log rolling technique/side to sit  Transfers Overall transfer level:  Needs assistance Equipment used: None Transfers: Sit to/from Stand Sit to Stand: Min guard         General transfer comment: min guard for safety, no true physical assist given  Ambulation/Gait Ambulation/Gait assistance: Min guard Gait Distance (Feet): 80 Feet Assistive device: None Gait Pattern/deviations: Step-through pattern;Decreased stride length;Wide base of support;Trunk flexed Gait velocity: reduced   General Gait Details: slow but steady without assistive device   Stairs             Wheelchair Mobility    Modified Rankin (Stroke Patients Only)       Balance Overall balance assessment: Needs assistance Sitting-balance support: Feet supported Sitting balance-Leahy Scale: Fair     Standing balance support: During functional activity Standing balance-Leahy Scale: Fair                              Cognition Arousal/Alertness: Awake/alert Behavior During Therapy: WFL for tasks assessed/performed Overall Cognitive Status: Within Functional Limits for tasks assessed                                        Exercises      General Comments        Pertinent Vitals/Pain Pain Assessment: Faces Faces Pain Scale: Hurts little more Pain Location: back Pain Descriptors / Indicators: Constant;Guarding;Aching;Sore;Grimacing Pain Intervention(s): Limited activity within patient's tolerance;Monitored during session;Repositioned    Home Living  Prior Function            PT Goals (current goals can now be found in the care plan section) Acute Rehab PT Goals Patient Stated Goal: to get his back pain under control PT Goal Formulation: With patient Time For Goal Achievement: 12/19/19 Potential to Achieve Goals: Good Progress towards PT goals: Progressing toward goals    Frequency    Min 3X/week      PT Plan Current plan remains appropriate    Co-evaluation              AM-PAC PT  "6 Clicks" Mobility   Outcome Measure  Help needed turning from your back to your side while in a flat bed without using bedrails?: None Help needed moving from lying on your back to sitting on the side of a flat bed without using bedrails?: None Help needed moving to and from a bed to a chair (including a wheelchair)?: A Little Help needed standing up from a chair using your arms (e.g., wheelchair or bedside chair)?: A Little Help needed to walk in hospital room?: A Little Help needed climbing 3-5 steps with a railing? : A Little 6 Click Score: 20    End of Session Equipment Utilized During Treatment: Gait belt (refused use of RW) Activity Tolerance: Patient tolerated treatment well;Patient limited by pain Patient left: in bed;with call bell/phone within reach;with bed alarm set (sitting at EOB per his request) Nurse Communication: Mobility status PT Visit Diagnosis: Unsteadiness on feet (R26.81);Muscle weakness (generalized) (M62.81);Pain Pain - Right/Left:  (back) Pain - part of body:  (back)     Time: 8841-6606 PT Time Calculation (min) (ACUTE ONLY): 24 min  Charges:  $Gait Training: 8-22 mins $Therapeutic Activity: 8-22 mins                     Windell Norfolk, DPT, PN1   Supplemental Physical Therapist [place]    Pager [phone] Acute Rehab Office [phone]

## 2019-12-06 NOTE — Telephone Encounter (Signed)
yes

## 2019-12-06 NOTE — Telephone Encounter (Signed)
Pt called to make a hospital follow up for Monday I went over the symptoms and states that he went in the hospital for coughing and SOB, he covid test is negative I just want to make sure that it is ok for him to come in the office Monday

## 2019-12-06 NOTE — Discharge Summary (Signed)
Physician Discharge Summary  NIKOLAI WILCZAK QQP:[id number] DOB: 11-Jul-1940 DOA: 12/01/2019  PCP: Denita Lung, MD  Admit date: 12/01/2019 Discharge date: 12/06/2019  Admitted From: Home Disposition: Home  Recommendations for Outpatient Follow-up:  1. Follow up with PCP in 1-2 weeks 2. Please obtain BMP/CBC in one week 3. 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 ED course: Patient afebrile, tachypneic, hypoxic with oxygen saturation 88 to 90% on room air placed on 2 L supplemental oxygen, blood pressure elevated upon arrival.  Subsequent reading significantly improved without administration of any antihypertensive medication.  WBC 31.9 with left shift, H&H 11.2/34.9, platelet: 258, sodium 131, potassium: 4.7 bicarb: 18, lactic acid: 1.1, INR: 1.2, troponin: Negative.  EKG without acute ischemic changes.  D-dimer: 1.19, UA and urine culture, blood culture: Pending, COVID-19 negative.  Influenza panel: Negative.  Chest x-ray shows moderate severity bilateral multifocal infiltrate and small bilateral pleural effusion.  CTA chest came back negative for PE.  Consistent with granulomatous disease and progressive pulmonary fibrosis.  Interlobular septal thickening with bilateral groundglass airspace opacities.  Findings concerning for pulmonary edema versus atypical infectious process.  Patient was given Rocephin and azithromycin and IV fluid as per  sepsis protocol in the ED and admitted for further management of sepsis and acute hypoxemic respiratory failure likely secondary to pneumonia versus fluid overload.  Patient admitted as above with acute hypoxic respiratory failure in the setting of chronic pulmonary fibrosis with acute severe community-acquired pneumonia and sepsis.  Also concern for concurrent diastolic heart failure exacerbation.  At this time patient has completed course of ceftriaxone and azithromycin, has diuresed quite well otherwise is only minimally hypoxic with exertion likely multifactorial in the setting of above acute illnesses and chronic pulmonary fibrosis.  At this time patient otherwise stable and agreeable for discharge home on new medications including diuretics and minimally increased hypertensive medication, close follow-up with PCP, pulmonology and cardiology as scheduled.  Discharge Diagnoses:  Principal Problem:   CAP (community acquired pneumonia) Active Problems:   Sepsis ([place])   Acute hypoxemic respiratory failure ([place])   Hemoptysis   Hyponatremia    Discharge Instructions  Discharge Instructions    Diet - low sodium heart healthy   Complete by: As directed    Increase activity slowly   Complete by: As directed    No wound care   Complete by: As directed      Allergies as of 12/06/2019      Reactions   Allopurinol Other (See Comments)   thrombocytopenia   Metformin And Related Diarrhea      Medication List    STOP taking these medications   doxycycline 100 MG tablet Commonly known as: VIBRA-TABS     TAKE these medications   amLODipine 5 MG tablet Commonly known as: NORVASC Take 1 tablet (5 mg total) by mouth daily.   aspirin 81 MG chewable tablet Chew 81 mg by mouth daily.   Centrum tablet Take 1 tablet by mouth daily.   clopidogrel 75 MG tablet Commonly known as: PLAVIX TAKE 1  TABLET BY MOUTH  DAILY   Enbrel SureClick 50 MG/ML injection Generic drug: etanercept Inject  50 mg into the skin once a week.   febuxostat 40 MG tablet Commonly known as: ULORIC Take 40 mg by mouth daily.   fish oil-omega-3 fatty acids 1000 MG capsule Take 1 g by mouth 2 (two) times daily.   furosemide 40 MG tablet Commonly known as: LASIX Take 1 tablet (40 mg total) by mouth daily. Start taking on: December 07, 2019   glucose blood test strip Commonly known as: Accu-Chek Aviva Plus Test once to twice daily   isosorbide mononitrate 60 MG 24 hr tablet Commonly known as: IMDUR Take 1 tablet (60 mg total) by mouth at bedtime. What changed:   medication strength  how much to take  how to take this  when to take this  additional instructions   leflunomide 20 MG tablet Commonly known as: ARAVA Take 20 mg by mouth daily.   metoprolol tartrate 25 MG tablet Commonly known as: LOPRESSOR TAKE 1 TABLET BY MOUTH  TWICE DAILY   nitroGLYCERIN 0.4 MG SL tablet Commonly known as: NITROSTAT Place 0.4 mg under the tongue every 5 (five) minutes as needed for chest pain. For 3 doses   pyridoxine 100 MG tablet Commonly known as: B-6 Take 100 mg by mouth daily.   silver sulfADIAZINE 1 % cream Commonly known as: Silvadene Apply pea-sized amount to wound daily.   simvastatin 20 MG tablet Commonly known as: ZOCOR Take 1 tablet (20 mg total) by mouth at bedtime.            Durable Medical Equipment  (From admission, onward)         Start     Ordered   12/06/19 1307  DME Oxygen  Once       Question Answer Comment  Length of Need Lifetime   Mode or (Route) Nasal cannula   Liters per Minute 2   Frequency Continuous (stationary and portable oxygen unit needed)   Oxygen delivery system Gas      12/06/19 1307          Allergies  Allergen Reactions  . Allopurinol Other (See Comments)    thrombocytopenia  . Metformin And Related Diarrhea    Consultations:  Cardiology, pulmonology   Procedures/Studies: DG Chest 2 View  Result Date:  12/01/2019 CLINICAL DATA:  Hemoptysis. EXAM: CHEST - 2 VIEW COMPARISON:  September 02, 2018 FINDINGS: Moderate severity multifocal infiltrates are seen involving the bilateral lung bases and mid lung fields. The mild diffusely increased interstitial lung markings are also seen. Biapical pleural thickening and small bilateral pleural effusions are noted. No pneumothorax is identified. The heart size and mediastinal contours are within normal limits. Multilevel degenerative changes seen throughout the thoracic spine. IMPRESSION: 1. Moderate severity bilateral multifocal infiltrates. 2. Small bilateral pleural effusions. Electronically Signed   By: Virgina Norfolk M.D.   On: 12/01/2019 19:24   CT Angio Chest PE W and/or Wo Contrast  Result Date: 12/01/2019 CLINICAL DATA:  Shortness of breath with back pain. EXAM: CT ANGIOGRAPHY CHEST WITH CONTRAST TECHNIQUE: Multidetector CT imaging of the chest was performed using the standard protocol during bolus administration of intravenous contrast. Multiplanar CT image reconstructions and MIPs were obtained to evaluate the vascular anatomy. CONTRAST:  138mL OMNIPAQUE IOHEXOL 350 MG/ML SOLN COMPARISON:  CT chest dated 11/03/2017. FINDINGS: Cardiovascular: Evaluation is significantly limited by respiratory motion artifact. Given this limitation, no acute pulmonary embolism was detected. The main pulmonary artery  is not significantly dilated. There are atherosclerotic changes of the thoracic aorta without evidence for an aneurysm. The heart size is enlarged. There are coronary artery calcifications. Mediastinum/Nodes: --mediastinal adenopathy is noted, likely reactive. There are several calcified mediastinal lymph nodes. --hilar adenopathy is noted, likely reactive. -- No axillary lymphadenopathy. -- No supraclavicular lymphadenopathy. -- Normal thyroid gland where visualized. -  Unremarkable esophagus. Lungs/Pleura: Again noted are findings of pulmonary fibrosis. This appears  to have significantly progressed since prior study. There is interlobular septal thickening. There are ground-glass airspace opacities bilaterally most evident in the left upper lobe and in the perihilar distribution. There are small to moderate-sized bilateral pleural effusions, left worse than right. There is no pneumothorax. The lung volumes are low. Upper Abdomen: Contrast bolus timing is not optimized for evaluation of the abdominal organs. There are numerable calcifications throughout the liver and spleen. Musculoskeletal: There is an age-indeterminate fracture through the superior endplate of the S93 vertebral body, favored to be chronic. There is subtle sclerosis and height loss of the T10 vertebral body, likely chronic. The liver surface appears lobular. Review of the MIP images confirms the above findings. IMPRESSION: 1. Evaluation is significantly limited by respiratory motion artifact. Given this limitation, no acute pulmonary embolism was detected. 2. Findings consistent with pulmonary fibrosis, progressed since prior study. 3. Interlobular septal thickening with bilateral ground-glass airspace opacities is suggestive of pulmonary edema. An atypical infectious process is not excluded. 4. Cardiomegaly with coronary artery disease. 5. Mediastinal and hilar adenopathy, likely reactive. 6. Findings of prior granulomatous disease. 7. Lobular liver surface suspicious for underlying cirrhosis. Aortic Atherosclerosis (ICD10-I70.0). Electronically Signed   By: Constance Holster M.D.   On: 12/01/2019 22:01   MR Lumbar Spine W Wo Contrast  Result Date: 12/04/2019 CLINICAL DATA:  Low back pain EXAM: MRI LUMBAR SPINE WITHOUT AND WITH CONTRAST TECHNIQUE: Multiplanar and multiecho pulse sequences of the lumbar spine were obtained without and with intravenous contrast. CONTRAST:  8.31mL GADAVIST GADOBUTROL 1 MMOL/ML IV SOLN COMPARISON:  None. FINDINGS: Segmentation:  Standard. Alignment:  Trace retrolisthesis at  L2-L3. Vertebrae: Chronic compression fractures and loss of height at L2-L4. Loss of height at the superior and inferior endplates of T34 appears increased (less than 50%) since June 2021 radiograph. There is mild marrow edema present. Marrow edema at the superior endplate of L1 is likely on a degenerative basis. Diffusely decreased T1 marrow signal may reflect reported history of anemia. Conus medullaris and cauda equina: Conus extends to the L1-L2 level. Conus and cauda equina appear normal. Paraspinal and other soft tissues: Unremarkable. Disc levels: Congenital narrowing of the spinal canal. L1-L2:  No canal or foraminal stenosis. L2-L3: Disc bulge with superimposed central disc extrusion extending inferiorly to the mid L3 level. Mild canal stenosis. Slight effacement of the lateral recesses. No foraminal stenosis. L3-L4: Disc bulge. Mild facet arthropathy. Mild canal stenosis. Slight effacement of the lateral recesses. No right foraminal stenosis. Mild left foraminal stenosis. L4-L5: Disc bulge with endplate osteophytic ridging. Marked facet arthropathy with ligamentum flavum infolding. Mild to moderate canal stenosis. Partial effacement of the lateral recesses. Mild foraminal stenosis. L5-S1: Disc bulge with endplate osteophytic ridging. Marked facet arthropathy. Mild canal stenosis. Partial effacement of the lateral recesses. Mild foraminal stenosis. IMPRESSION: Increased loss of height at the superior and inferior endplates of K87 since June 2021 radiograph with mild marrow edema. This is probably subacute. 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 No high-grade stenosis. Marked lower lumbar facet arthropathy. Electronically Signed  By: Macy Mis M.D.   On: 12/04/2019 16:51   ECHOCARDIOGRAM COMPLETE  Result Date: 12/02/2019    ECHOCARDIOGRAM REPORT   Patient Name:   ALBIE ARIZPE Date of Exam: 12/02/2019 Medical Rec #:  [id number]          Height:       70.0  in Accession #:    [id number]         Weight:       186.0 lb Date of Birth:  1940/10/23           BSA:          2.024 m Patient Age:    79 years           BP:           157/84 mmHg Patient Gender: M                  HR:           71 bpm. Exam Location:  Inpatient Procedure: 2D Echo, Cardiac Doppler and Color Doppler Indications:    Congestive Heart Failure 428.0 / I50.9  History:        Patient has prior history of Echocardiogram examinations, most                 recent 09/02/2018. CAD; Risk Factors:Current Smoker, Hypertension,                 Diabetes and Dyslipidemia.  Sonographer:    Alvino Chapel RCS Referring Phys: [id number] [place]  1. Left ventricular ejection fraction, by estimation, is 55 to 60%. The left ventricle has normal function. The left ventricle has no regional wall motion abnormalities. Left ventricular diastolic parameters are consistent with Grade II diastolic dysfunction (pseudonormalization). Elevated left ventricular end-diastolic pressure.  2. Right ventricular systolic function is normal. The right ventricular size is normal. There is moderately elevated pulmonary artery systolic pressure. The estimated right ventricular systolic pressure is 85.8 mmHg.  3. Left atrial size was mildly dilated.  4. The mitral valve is normal in structure. Trivial mitral valve regurgitation. No evidence of mitral stenosis.  5. The aortic valve is tricuspid. Aortic valve regurgitation is moderate to severe. Mild aortic valve stenosis. Aortic valve area, by VTI measures 1.52 cm. Aortic valve mean gradient measures 11.0 mmHg. Aortic valve Vmax measures 2.30 m/s.  6. The inferior vena cava is dilated in size with <50% respiratory variability, suggesting right atrial pressure of 15 mmHg. FINDINGS  Left Ventricle: Left ventricular ejection fraction, by estimation, is 55 to 60%. The left ventricle has normal function. The left ventricle has no regional wall motion abnormalities. The left ventricular  internal cavity size was normal in size. There is  no left ventricular hypertrophy. Left ventricular diastolic parameters are consistent with Grade II diastolic dysfunction (pseudonormalization). Elevated left ventricular end-diastolic pressure. Right Ventricle: The right ventricular size is normal. No increase in right ventricular wall thickness. Right ventricular systolic function is normal. There is moderately elevated pulmonary artery systolic pressure. The tricuspid regurgitant velocity is 3.41 m/s, and with an assumed right atrial pressure of 15 mmHg, the estimated right ventricular systolic pressure is 85.0 mmHg. Left Atrium: Left atrial size was mildly dilated. Right Atrium: Right atrial size was normal in size. Pericardium: There is no evidence of pericardial effusion. Mitral Valve: The mitral valve is normal in structure. There is mild thickening of the mitral valve leaflet(s). Trivial mitral valve regurgitation. No evidence of mitral  valve stenosis. Tricuspid Valve: The tricuspid valve is normal in structure. Tricuspid valve regurgitation is mild . No evidence of tricuspid stenosis. Aortic Valve: The aortic valve is tricuspid. Aortic valve regurgitation is moderate to severe. Aortic regurgitation PHT measures 221 msec. Mild aortic stenosis is present. Aortic valve mean gradient measures 11.0 mmHg. Aortic valve peak gradient measures  21.2 mmHg. Aortic valve area, by VTI measures 1.52 cm. Pulmonic Valve: The pulmonic valve was normal in structure. Pulmonic valve regurgitation is trivial. No evidence of pulmonic stenosis. Aorta: The aortic root is normal in size and structure. Venous: The inferior vena cava is dilated in size with less than 50% respiratory variability, suggesting right atrial pressure of 15 mmHg. IAS/Shunts: No atrial level shunt detected by color flow Doppler.  LEFT VENTRICLE PLAX 2D LVIDd:         5.80 cm  Diastology LVIDs:         3.30 cm  LV e' medial:    7.18 cm/s LV PW:         1.10  cm  LV E/e' medial:  15.3 LV IVS:        1.10 cm  LV e' lateral:   9.46 cm/s LVOT diam:     1.90 cm  LV E/e' lateral: 11.6 LV SV:         87 LV SV Index:   43 LVOT Area:     2.84 cm  RIGHT VENTRICLE RV S prime:     14.40 cm/s TAPSE (M-mode): 3.1 cm LEFT ATRIUM             Index       RIGHT ATRIUM           Index LA diam:        4.00 cm 1.98 cm/m  RA Area:     22.20 cm LA Vol (A2C):   77.2 ml 38.14 ml/m RA Volume:   71.80 ml  35.48 ml/m LA Vol (A4C):   86.5 ml 42.74 ml/m LA Biplane Vol: 83.0 ml 41.01 ml/m  AORTIC VALVE AV Area (Vmax):    1.54 cm AV Area (Vmean):   1.54 cm AV Area (VTI):     1.52 cm AV Vmax:           230.00 cm/s AV Vmean:          155.000 cm/s AV VTI:            0.576 m AV Peak Grad:      21.2 mmHg AV Mean Grad:      11.0 mmHg LVOT Vmax:         125.00 cm/s LVOT Vmean:        84.200 cm/s LVOT VTI:          0.308 m LVOT/AV VTI ratio: 0.53 AI PHT:            221 msec  AORTA Ao Root diam: 3.40 cm MITRAL VALVE                TRICUSPID VALVE MV Area (PHT): 4.74 cm     TR Peak grad:   46.5 mmHg MV Decel Time: 160 msec     TR Vmax:        341.00 cm/s MV E velocity: 110.00 cm/s MV A velocity: 96.80 cm/s   SHUNTS MV E/A ratio:  1.14         Systemic VTI:  0.31 m  Systemic Diam: 1.90 cm Fransico Him MD Electronically signed by Fransico Him MD Signature Date/Time: 12/02/2019/1:32:46 PM    Final       Subjective: No acute issues or events overnight denies nausea, vomiting, diarrhea, constipation, headache, fevers, chills.   Discharge Exam: Vitals:   12/06/19 0503 12/06/19 0847  BP: (!) 139/42 (!) 139/47  Pulse: (!) 58 65  Resp: 20 16  Temp: 98 F (36.7 C) 98.7 F (37.1 C)  SpO2: 96% 95%   Vitals:   12/05/19 1600 12/05/19 2024 12/06/19 0503 12/06/19 0847  BP: (!) 150/51 (!) 135/56 (!) 139/42 (!) 139/47  Pulse: 60 65 (!) 58 65  Resp: 18 18 20 16   Temp: 98 F (36.7 C) 98.7 F (37.1 C) 98 F (36.7 C) 98.7 F (37.1 C)  TempSrc: Oral Oral Oral Oral   SpO2: 97% 96% 96% 95%  Weight:      Height:        General: Pt is alert, awake, not in acute distress Cardiovascular: RRR, S1/S2 +, no rubs, no gallops Respiratory: CTA bilaterally, no wheezing, no rhonchi Abdominal: Soft, NT, ND, bowel sounds + Extremities: no edema, no cyanosis    The results of significant diagnostics from this hospitalization (including imaging, microbiology, ancillary and laboratory) are listed below for reference.     Microbiology: Recent Results (from the past 240 hour(s))  Blood Culture (routine x 2)     Status: None   Collection Time: 12/01/19  9:06 PM   Specimen: BLOOD LEFT ARM  Result Value Ref Range Status   Specimen Description BLOOD LEFT ARM  Final   Special Requests   Final    BOTTLES DRAWN AEROBIC AND ANAEROBIC Blood Culture adequate volume   Culture   Final    NO GROWTH 5 DAYS Performed at [facility], [address], [place], [place] [address]    Report Status 12/06/2019 FINAL  Final  Blood Culture (routine x 2)     Status: None   Collection Time: 12/01/19  9:26 PM   Specimen: BLOOD LEFT HAND  Result Value Ref Range Status   Specimen Description BLOOD LEFT HAND  Final   Special Requests   Final    BOTTLES DRAWN AEROBIC AND ANAEROBIC Blood Culture adequate volume   Culture   Final    NO GROWTH 5 DAYS Performed at [facility], [place] [address], [place], [place] [address]    Report Status 12/06/2019 FINAL  Final  Respiratory Panel by RT PCR (Flu A&B, Covid) - Nasopharyngeal Swab     Status: None   Collection Time: 12/01/19  9:32 PM   Specimen: Nasopharyngeal Swab  Result Value Ref Range Status   SARS Coronavirus 2 by RT PCR NEGATIVE NEGATIVE Final    Comment: (NOTE) SARS-CoV-2 target nucleic acids are NOT DETECTED.  The SARS-CoV-2 RNA is generally detectable in upper respiratoy specimens during the acute phase of infection. The lowest concentration of SARS-CoV-2 viral copies this assay can detect is 131 copies/mL. A  negative result does not preclude SARS-Cov-2 infection and should not be used as the sole basis for treatment or other patient management decisions. A negative result may occur with  improper specimen collection/handling, submission of specimen other than nasopharyngeal swab, presence of viral mutation(s) within the areas targeted by this assay, and inadequate number of viral copies (<131 copies/mL). A negative result must be combined with clinical observations, patient history, and epidemiological information. The expected result is Negative.  Fact Sheet for Patients:  PinkCheek.be  Fact Sheet for Healthcare Providers:  GravelBags.it  This test is no t yet approved or cleared by the [place] FDA and  has been authorized for detection and/or diagnosis of SARS-CoV-2 by FDA under an Emergency Use Authorization (EUA). This EUA will remain  in effect (meaning this test can be used) for the duration of the COVID-19 declaration under Section 564(b)(1) of the Act, 21 U.S.C. section 360bbb-3(b)(1), unless the authorization is terminated or revoked sooner.     Influenza A by PCR NEGATIVE NEGATIVE Final   Influenza B by PCR NEGATIVE NEGATIVE Final    Comment: (NOTE) The Xpert Xpress SARS-CoV-2/FLU/RSV assay is intended as an aid in  the diagnosis of influenza from Nasopharyngeal swab specimens and  should not be used as a sole basis for treatment. Nasal washings and  aspirates are unacceptable for Xpert Xpress SARS-CoV-2/FLU/RSV  testing.  Fact Sheet for Patients: PinkCheek.be  Fact Sheet for Healthcare Providers: GravelBags.it  This test is not yet approved or cleared by the [place] FDA and  has been authorized for detection and/or diagnosis of SARS-CoV-2 by  FDA under an Emergency Use Authorization (EUA). This EUA will remain  in effect (meaning this test can  be used) for the duration of the  Covid-19 declaration under Section 564(b)(1) of the Act, 21  U.S.C. section 360bbb-3(b)(1), unless the authorization is  terminated or revoked. Performed at [facility], [place] [address], [place], [place] [address]   Urine culture     Status: None   Collection Time: 12/02/19 12:07 AM   Specimen: In/Out Cath Urine  Result Value Ref Range Status   Specimen Description IN/OUT CATH URINE  Final   Special Requests NONE  Final   Culture   Final    NO GROWTH Performed at [facility], [place] [address] [place], [place] [address]    Report Status 12/03/2019 FINAL  Final     Labs: BNP (last 3 results) Recent Labs    12/02/19 1034  BNP 295.6*   Basic Metabolic Panel: Recent Labs  Lab 12/01/19 1915 12/02/19 1032 12/03/19 0323 12/04/19 0124 12/05/19 0138  NA 131* 133* 135 132* 133*  K 4.7 4.6 4.6 4.2 4.4  CL 105 107 107 102 103  CO2 18* 21* 22 22 22   GLUCOSE 130* 138* 96 95 113*  BUN 26* 25* 28* 32* 33*  CREATININE 1.36* 1.26* 1.41* 1.45* 1.41*  CALCIUM 8.5* 8.5* 8.4* 8.4* 8.6*  MG  --   --  1.6* 1.8  --    Liver Function Tests: Recent Labs  Lab 12/01/19 2019 12/02/19 1032 12/03/19 0323 12/04/19 0124  AST 25 19 22 25   ALT 17 17 18 21   ALKPHOS 83 74 80 85  BILITOT 1.3* 0.9 0.8 0.8  PROT 7.5 7.5 7.4 6.8  ALBUMIN 2.5* 2.6* 2.4* 2.3*   No results for input(s): LIPASE, AMYLASE in the last 168 hours. No results for input(s): AMMONIA in the last 168 hours. CBC: Recent Labs  Lab 11/29/19 1319 11/29/19 1319 12/01/19 1915 12/02/19 1037 12/03/19 0323 12/04/19 0124 12/05/19 0138  WBC 16.2*  --  31.9*  32.0* 24.4* 18.9* 15.3* 16.0*  NEUTROABS 10.3*  --  21.0*  --  14.6*  --   --   HGB 10.0*  --  11.2*  11.0* 9.4* 8.7* 8.7* 9.2*  HCT 30.3*   < > 34.9*  34.7* 29.2* 26.9* 26.8* 27.9*  MCV 81.2   < > 82.9  82.4 83.0  82.0 82.0 81.6  PLT 214  --  258  275 222 197 198 212   < > = values in this interval not displayed.    Cardiac Enzymes: No results for input(s): CKTOTAL, CKMB, CKMBINDEX, TROPONINI in the last 168 hours. BNP: Invalid input(s): POCBNP CBG: No results for input(s): GLUCAP in the last 168 hours. D-Dimer No results for input(s): DDIMER in the last 72 hours. Hgb A1c No results for input(s): HGBA1C in the last 72 hours. Lipid Profile No results for input(s): CHOL, HDL, LDLCALC, TRIG, CHOLHDL, LDLDIRECT in the last 72 hours. Thyroid function studies No results for input(s): TSH, T4TOTAL, T3FREE, THYROIDAB in the last 72 hours.  Invalid input(s): FREET3 Anemia work up No results for input(s): VITAMINB12, FOLATE, FERRITIN, TIBC, IRON, RETICCTPCT in the last 72 hours. Urinalysis    Component Value Date/Time   COLORURINE YELLOW 12/02/2019 1034   APPEARANCEUR CLEAR 12/02/2019 1034   APPEARANCEUR Clear 09/27/2017 1520   LABSPEC 1.024 12/02/2019 1034   PHURINE 5.0 12/02/2019 1034   GLUCOSEU NEGATIVE 12/02/2019 1034   HGBUR SMALL (A) 12/02/2019 1034   BILIRUBINUR NEGATIVE 12/02/2019 1034   BILIRUBINUR Negative 09/27/2017 [place] 12/02/2019 1034   PROTEINUR 30 (A) 12/02/2019 1034   UROBILINOGEN negative 11/17/2015 0911   NITRITE NEGATIVE 12/02/2019 1034   LEUKOCYTESUR NEGATIVE 12/02/2019 1034   Sepsis Labs Invalid input(s): PROCALCITONIN,  WBC,  LACTICIDVEN Microbiology Recent Results (from the past 240 hour(s))  Blood Culture (routine x 2)     Status: None   Collection Time: 12/01/19  9:06 PM   Specimen: BLOOD LEFT ARM  Result Value Ref Range Status   Specimen Description BLOOD LEFT ARM  Final   Special Requests   Final    BOTTLES DRAWN AEROBIC AND ANAEROBIC Blood Culture adequate volume   Culture   Final    NO GROWTH 5 DAYS Performed at [facility], [place] [address], [place], [place] [address]    Report Status 12/06/2019 FINAL  Final  Blood Culture (routine x 2)     Status: None   Collection Time: 12/01/19  9:26 PM   Specimen: BLOOD LEFT HAND  Result  Value Ref Range Status   Specimen Description BLOOD LEFT HAND  Final   Special Requests   Final    BOTTLES DRAWN AEROBIC AND ANAEROBIC Blood Culture adequate volume   Culture   Final    NO GROWTH 5 DAYS Performed at [facility], [place] [address] [place], [place] [address]    Report Status 12/06/2019 FINAL  Final  Respiratory Panel by RT PCR (Flu A&B, Covid) - Nasopharyngeal Swab     Status: None   Collection Time: 12/01/19  9:32 PM   Specimen: Nasopharyngeal Swab  Result Value Ref Range Status   SARS Coronavirus 2 by RT PCR NEGATIVE NEGATIVE Final    Comment: (NOTE) SARS-CoV-2 target nucleic acids are NOT DETECTED.  The SARS-CoV-2 RNA is generally detectable in upper respiratoy specimens during the acute phase of infection. The lowest concentration of SARS-CoV-2 viral copies this assay can detect is 131 copies/mL. A negative result does not preclude SARS-Cov-2 infection and should not be used as the sole basis for treatment or other patient management decisions. A negative result may occur with  improper specimen collection/handling, submission of specimen other than nasopharyngeal swab, presence of viral mutation(s) within the areas targeted by this assay, and inadequate number of viral copies (<131 copies/mL). A negative result must be combined with clinical  observations, patient history, and epidemiological information. The expected result is Negative.  Fact Sheet for Patients:  PinkCheek.be  Fact Sheet for Healthcare Providers:  GravelBags.it  This test is no t yet approved or cleared by the [place] FDA and  has been authorized for detection and/or diagnosis of SARS-CoV-2 by FDA under an Emergency Use Authorization (EUA). This EUA will remain  in effect (meaning this test can be used) for the duration of the COVID-19 declaration under Section 564(b)(1) of the Act, 21 U.S.C. section 360bbb-3(b)(1), unless  the authorization is terminated or revoked sooner.     Influenza A by PCR NEGATIVE NEGATIVE Final   Influenza B by PCR NEGATIVE NEGATIVE Final    Comment: (NOTE) The Xpert Xpress SARS-CoV-2/FLU/RSV assay is intended as an aid in  the diagnosis of influenza from Nasopharyngeal swab specimens and  should not be used as a sole basis for treatment. Nasal washings and  aspirates are unacceptable for Xpert Xpress SARS-CoV-2/FLU/RSV  testing.  Fact Sheet for Patients: PinkCheek.be  Fact Sheet for Healthcare Providers: GravelBags.it  This test is not yet approved or cleared by the [place] FDA and  has been authorized for detection and/or diagnosis of SARS-CoV-2 by  FDA under an Emergency Use Authorization (EUA). This EUA will remain  in effect (meaning this test can be used) for the duration of the  Covid-19 declaration under Section 564(b)(1) of the Act, 21  U.S.C. section 360bbb-3(b)(1), unless the authorization is  terminated or revoked. Performed at [facility], [place] [address] [place], [place] [address]   Urine culture     Status: None   Collection Time: 12/02/19 12:07 AM   Specimen: In/Out Cath Urine  Result Value Ref Range Status   Specimen Description IN/OUT CATH URINE  Final   Special Requests NONE  Final   Culture   Final    NO GROWTH Performed at [facility], [place] [address], [place], [place] [address]    Report Status 12/03/2019 FINAL  Final     Time coordinating discharge: Over 30 minutes  SIGNED:   Little Ishikawa, DO [facility] 12/06/2019, 1:08 PM Pager   If 7PM-7AM, please contact night-coverage [email or web address]

## 2019-12-06 NOTE — Progress Notes (Signed)
Discharge instructions (including medications) discussed with and copy provided to patient/caregiver 

## 2019-12-06 NOTE — TOC Transition Note (Signed)
Transition of Care [facility]) - CM/SW Discharge Note   Patient Details  Name: KARIS EMIG MRN: [id number] Date of Birth: May 17, 1940  [facility]) CM/SW Contact:  Bartholomew Crews, RN Phone Number: [phone] 12/06/2019, 1:23 PM   Clinical Narrative:     Patient to transition home today. Portable oxygen delivered to hospital bedside by [facility]. Patient educated by [employer] [facility] to call office when he gets home in order for home delivery. No further TOC needs identified.   Final next level of care: Home/Self Care Barriers to Discharge: No Barriers Identified   Patient Goals and CMS Choice Patient states their goals for this hospitalization and ongoing recovery are:: return home stated he does not need any therapy, he will do his own CMS Medicare.gov Compare Post Acute Care list provided to:: Patient Choice offered to / list presented to : Patient  Discharge Placement                       Discharge Plan and Services In-house Referral: NA Discharge Planning Services: CM Consult Post Acute Care Choice: Durable Medical Equipment          DME Arranged: Oxygen DME Agency: [employer] Date DME Agency Contacted: 12/06/19 Time DME Agency Contacted: 63 Representative spoke with at DME Agency: [place]: Refused [place] Agency: NA        Social Determinants of Health ([place]) Interventions     Readmission Risk Interventions No flowsheet data found.

## 2019-12-06 NOTE — Progress Notes (Signed)
Please see in addition to PT note:   SATURATION QUALIFICATIONS: (This note is used to comply with regulatory documentation for home oxygen)  Patient Saturations on Room Air at Rest = 93%  Patient Saturations on Room Air while Ambulating = 85-86%   Patient Saturations on 2 Liters of oxygen while Ambulating = DNT    Please briefly explain why patient needs home oxygen:desat into mid-80s with activity- BUT able to recover into 90s with seated rest/PLB.  Windell Norfolk, DPT, PN1   Supplemental Physical Therapist [facility]    Pager [phone] Acute Rehab Office [phone]

## 2019-12-07 DIAGNOSIS — M109 Gout, unspecified: Secondary | ICD-10-CM | POA: Diagnosis not present

## 2019-12-07 DIAGNOSIS — I82A11 Acute embolism and thrombosis of right axillary vein: Secondary | ICD-10-CM | POA: Diagnosis not present

## 2019-12-07 DIAGNOSIS — M0579 Rheumatoid arthritis with rheumatoid factor of multiple sites without organ or systems involvement: Secondary | ICD-10-CM | POA: Diagnosis not present

## 2019-12-07 DIAGNOSIS — Z7189 Other specified counseling: Secondary | ICD-10-CM | POA: Diagnosis not present

## 2019-12-07 DIAGNOSIS — M25562 Pain in left knee: Secondary | ICD-10-CM | POA: Diagnosis not present

## 2019-12-10 ENCOUNTER — Inpatient Hospital Stay: Payer: Medicare Other

## 2019-12-10 ENCOUNTER — Ambulatory Visit (INDEPENDENT_AMBULATORY_CARE_PROVIDER_SITE_OTHER): Payer: Medicare Other | Admitting: Family Medicine

## 2019-12-10 ENCOUNTER — Inpatient Hospital Stay: Payer: Medicare Other | Attending: Hematology

## 2019-12-10 ENCOUNTER — Other Ambulatory Visit: Payer: Self-pay

## 2019-12-10 VITALS — BP 120/70 | HR 62 | Temp 97.5°F | Wt 177.8 lb

## 2019-12-10 VITALS — BP 141/48 | HR 74 | Temp 98.0°F | Resp 18

## 2019-12-10 DIAGNOSIS — D638 Anemia in other chronic diseases classified elsewhere: Secondary | ICD-10-CM

## 2019-12-10 DIAGNOSIS — I2510 Atherosclerotic heart disease of native coronary artery without angina pectoris: Secondary | ICD-10-CM

## 2019-12-10 DIAGNOSIS — Z79899 Other long term (current) drug therapy: Secondary | ICD-10-CM | POA: Insufficient documentation

## 2019-12-10 DIAGNOSIS — R531 Weakness: Secondary | ICD-10-CM | POA: Insufficient documentation

## 2019-12-10 DIAGNOSIS — I132 Hypertensive heart and chronic kidney disease with heart failure and with stage 5 chronic kidney disease, or end stage renal disease: Secondary | ICD-10-CM | POA: Diagnosis not present

## 2019-12-10 DIAGNOSIS — J439 Emphysema, unspecified: Secondary | ICD-10-CM | POA: Insufficient documentation

## 2019-12-10 DIAGNOSIS — Z7982 Long term (current) use of aspirin: Secondary | ICD-10-CM | POA: Insufficient documentation

## 2019-12-10 DIAGNOSIS — E538 Deficiency of other specified B group vitamins: Secondary | ICD-10-CM | POA: Diagnosis not present

## 2019-12-10 DIAGNOSIS — N185 Chronic kidney disease, stage 5: Secondary | ICD-10-CM | POA: Insufficient documentation

## 2019-12-10 DIAGNOSIS — I700 Atherosclerosis of aorta: Secondary | ICD-10-CM

## 2019-12-10 DIAGNOSIS — E1122 Type 2 diabetes mellitus with diabetic chronic kidney disease: Secondary | ICD-10-CM | POA: Diagnosis not present

## 2019-12-10 DIAGNOSIS — F17210 Nicotine dependence, cigarettes, uncomplicated: Secondary | ICD-10-CM | POA: Insufficient documentation

## 2019-12-10 DIAGNOSIS — E785 Hyperlipidemia, unspecified: Secondary | ICD-10-CM | POA: Diagnosis not present

## 2019-12-10 DIAGNOSIS — J189 Pneumonia, unspecified organism: Secondary | ICD-10-CM | POA: Diagnosis not present

## 2019-12-10 DIAGNOSIS — M069 Rheumatoid arthritis, unspecified: Secondary | ICD-10-CM

## 2019-12-10 DIAGNOSIS — K5900 Constipation, unspecified: Secondary | ICD-10-CM | POA: Diagnosis not present

## 2019-12-10 DIAGNOSIS — Z86718 Personal history of other venous thrombosis and embolism: Secondary | ICD-10-CM | POA: Insufficient documentation

## 2019-12-10 DIAGNOSIS — K219 Gastro-esophageal reflux disease without esophagitis: Secondary | ICD-10-CM | POA: Insufficient documentation

## 2019-12-10 DIAGNOSIS — G8929 Other chronic pain: Secondary | ICD-10-CM | POA: Insufficient documentation

## 2019-12-10 DIAGNOSIS — M545 Low back pain, unspecified: Secondary | ICD-10-CM

## 2019-12-10 DIAGNOSIS — R058 Other specified cough: Secondary | ICD-10-CM | POA: Diagnosis not present

## 2019-12-10 DIAGNOSIS — D469 Myelodysplastic syndrome, unspecified: Secondary | ICD-10-CM | POA: Insufficient documentation

## 2019-12-10 DIAGNOSIS — I714 Abdominal aortic aneurysm, without rupture: Secondary | ICD-10-CM | POA: Insufficient documentation

## 2019-12-10 DIAGNOSIS — D631 Anemia in chronic kidney disease: Secondary | ICD-10-CM | POA: Diagnosis not present

## 2019-12-10 DIAGNOSIS — K7460 Unspecified cirrhosis of liver: Secondary | ICD-10-CM | POA: Insufficient documentation

## 2019-12-10 DIAGNOSIS — J8410 Pulmonary fibrosis, unspecified: Secondary | ICD-10-CM

## 2019-12-10 LAB — COMPREHENSIVE METABOLIC PANEL
ALT: 22 IU/L (ref 0–44)
AST: 29 IU/L (ref 0–40)
Albumin/Globulin Ratio: 0.6 — ABNORMAL LOW (ref 1.2–2.2)
Albumin: 3 g/dL — ABNORMAL LOW (ref 3.7–4.7)
Alkaline Phosphatase: 115 IU/L (ref 44–121)
BUN/Creatinine Ratio: 27 — ABNORMAL HIGH (ref 10–24)
BUN: 43 mg/dL — ABNORMAL HIGH (ref 8–27)
Bilirubin Total: 0.8 mg/dL (ref 0.0–1.2)
CO2: 23 mmol/L (ref 20–29)
Calcium: 8.9 mg/dL (ref 8.6–10.2)
Chloride: 105 mmol/L (ref 96–106)
Creatinine, Ser: 1.57 mg/dL — ABNORMAL HIGH (ref 0.76–1.27)
GFR calc Af Amer: 48 mL/min/{1.73_m2} — ABNORMAL LOW (ref >59)
GFR calc non Af Amer: 42 mL/min/{1.73_m2} — ABNORMAL LOW (ref >59)
Globulin, Total: 4.8 g/dL — ABNORMAL HIGH (ref 1.5–4.5)
Glucose: 151 mg/dL — ABNORMAL HIGH (ref 65–99)
Potassium: 4.7 mmol/L (ref 3.5–5.2)
Sodium: 138 mmol/L (ref 134–144)
Total Protein: 7.8 g/dL (ref 6.0–8.5)

## 2019-12-10 LAB — CBC WITH DIFFERENTIAL (CANCER CENTER ONLY)
Abs Immature Granulocytes: 0.41 10*3/uL — ABNORMAL HIGH (ref 0.00–0.07)
Basophils Absolute: 0.3 10*3/uL — ABNORMAL HIGH (ref 0.0–0.1)
Basophils Relative: 2 %
Eosinophils Absolute: 0.6 10*3/uL — ABNORMAL HIGH (ref 0.0–0.5)
Eosinophils Relative: 4 %
HCT: 30.8 % — ABNORMAL LOW (ref 39.0–52.0)
Hemoglobin: 10.1 g/dL — ABNORMAL LOW (ref 13.0–17.0)
Immature Granulocytes: 3 %
Lymphocytes Relative: 19 %
Lymphs Abs: 2.9 10*3/uL (ref 0.7–4.0)
MCH: 26.6 pg (ref 26.0–34.0)
MCHC: 32.8 g/dL (ref 30.0–36.0)
MCV: 81.3 fL (ref 80.0–100.0)
Monocytes Absolute: 0.3 10*3/uL (ref 0.1–1.0)
Monocytes Relative: 2 %
Neutro Abs: 10.9 10*3/uL — ABNORMAL HIGH (ref 1.7–7.7)
Neutrophils Relative %: 70 %
Platelet Count: 262 10*3/uL (ref 150–400)
RBC: 3.79 MIL/uL — ABNORMAL LOW (ref 4.22–5.81)
RDW: 18.3 % — ABNORMAL HIGH (ref 11.5–15.5)
WBC Count: 15.5 10*3/uL — ABNORMAL HIGH (ref 4.0–10.5)
nRBC: 0 % (ref 0.0–0.2)

## 2019-12-10 LAB — CBC WITH DIFFERENTIAL/PLATELET
Basophils Absolute: 0.3 10*3/uL — ABNORMAL HIGH (ref 0.0–0.2)
Basos: 2 %
EOS (ABSOLUTE): 0.6 10*3/uL — ABNORMAL HIGH (ref 0.0–0.4)
Eos: 4 %
Hematocrit: 31.8 % — ABNORMAL LOW (ref 37.5–51.0)
Hemoglobin: 10.3 g/dL — ABNORMAL LOW (ref 13.0–17.7)
Immature Grans (Abs): 0.4 10*3/uL — ABNORMAL HIGH (ref 0.0–0.1)
Immature Granulocytes: 3 %
Lymphocytes Absolute: 2.3 10*3/uL (ref 0.7–3.1)
Lymphs: 16 %
MCH: 26.2 pg — ABNORMAL LOW (ref 26.6–33.0)
MCHC: 32.4 g/dL (ref 31.5–35.7)
MCV: 81 fL (ref 79–97)
Monocytes Absolute: 0.3 10*3/uL (ref 0.1–0.9)
Monocytes: 2 %
Neutrophils Absolute: 10.4 10*3/uL — ABNORMAL HIGH (ref 1.4–7.0)
Neutrophils: 73 %
Platelets: 303 10*3/uL (ref 150–450)
RBC: 3.93 x10E6/uL — ABNORMAL LOW (ref 4.14–5.80)
RDW: 16.3 % — ABNORMAL HIGH (ref 11.6–15.4)
WBC: 14.2 10*3/uL — ABNORMAL HIGH (ref 3.4–10.8)

## 2019-12-10 MED ORDER — EPOETIN ALFA-EPBX 10000 UNIT/ML IJ SOLN
30000.0000 [IU] | INTRAMUSCULAR | Status: DC
  Administered 2019-12-10: 30000 [IU] via SUBCUTANEOUS

## 2019-12-10 MED ORDER — EPOETIN ALFA-EPBX 10000 UNIT/ML IJ SOLN
INTRAMUSCULAR | Status: AC
  Filled 2019-12-10: qty 3

## 2019-12-10 NOTE — Progress Notes (Signed)
   Subjective:    Patient ID: Paul [name], male    DOB: May 10, 1940, 79 y.o.   MRN: [id number]  HPI He is here for follow-up visit after recent hospitalization and treatment for CAP with hypoxia, hyponatremia, sepsis.  He also had component of CHF.  He responded well to Lasix, antibiotics, oxygen.  He continues to complain of low back pain he has had difficulty with back pain his entire life from an injury that occurred approximately 50 years ago.  Apparently is now causing more difficulty.  He has been using heat constantly as well as occasional Tylenol.  He continues to be followed by rheumatology for his underlying rheumatoid arthritis.  They recently stopped his Enbrel because of the recent pneumonia.  CT scan did show evidence of continuing difficulty with pulmonary fibrosis.  Scan also showed evidence of aortic atherosclerosis.  He is followed regularly by cardiology.  So far he has not had to see anybody in particular for his back pain as he has been taking care of this on his own.  Presently he is just taking Tylenol periodically.   Review of Systems     Objective:   Physical Exam Alert and in no distress.  Pulse ox on room air is 97.  Cardiac exam shows regular rhythm without murmurs or gallops.  Lungs clear to auscultation.       Assessment & Plan:  Anemia of chronic disease  Coronary artery disease involving native coronary artery of native heart without angina pectoris  Community acquired pneumonia, unspecified laterality  Rheumatoid arthritis of other site, unspecified whether rheumatoid factor present ([name])  Chronic low back pain without sciatica, unspecified back pain laterality  Pulmonary fibrosis ([place])  Aortic atherosclerosis ([place]) At this point he seems to be doing well with the pneumonia and is now back to his normal pattern.  He does have oxygen at home however I recommended a keep an eye on it with the pulse oximeter and keep it above 92.  I have no plans to  continue his oxygen because I do not think he is going to need it.  He will continue on his other medications and follow-up with cardiology as needed.  No pulmonary consult at the present time but will reserve it for later.  He will continue be followed by rheumatology.  He is to take 2 Tylenol 4 times per day for the back pain.  Explained that we will be limited on what pain meds we could give him and may possibly need to be referred to either orthopedics or neurosurgery for follow-up on his back pain.  He will keep me informed concerning this.

## 2019-12-10 NOTE — Patient Instructions (Signed)

## 2019-12-10 NOTE — Patient Instructions (Addendum)
Periodically check the pulse ox and make sure it is above 92.  If there are any questions call me. Take 2 Tylenol 4 times a day for your back pain

## 2019-12-11 NOTE — Addendum Note (Signed)
Addended by: Denita Lung on: 12/11/2019 01:48 PM   Modules accepted: Orders

## 2019-12-18 ENCOUNTER — Telehealth: Payer: Self-pay

## 2019-12-18 NOTE — Telephone Encounter (Signed)
Pt called and advised he would like the o2 picked up. Per your last note there are no plans to keep pt on o2. [facility] requires a script to d/c o2 faxed over before they will pick it up. Please advise [facility]

## 2019-12-19 NOTE — Telephone Encounter (Signed)
Done and pt was advised [facility]

## 2019-12-19 NOTE — Telephone Encounter (Signed)
done

## 2019-12-20 ENCOUNTER — Inpatient Hospital Stay: Payer: Medicare Other

## 2019-12-20 ENCOUNTER — Inpatient Hospital Stay (HOSPITAL_BASED_OUTPATIENT_CLINIC_OR_DEPARTMENT_OTHER): Payer: Medicare Other | Admitting: Nurse Practitioner

## 2019-12-20 ENCOUNTER — Encounter: Payer: Self-pay | Admitting: Nurse Practitioner

## 2019-12-20 ENCOUNTER — Other Ambulatory Visit: Payer: Self-pay

## 2019-12-20 VITALS — BP 120/52 | HR 65 | Temp 97.4°F | Resp 18

## 2019-12-20 DIAGNOSIS — D638 Anemia in other chronic diseases classified elsewhere: Secondary | ICD-10-CM

## 2019-12-20 DIAGNOSIS — Z86718 Personal history of other venous thrombosis and embolism: Secondary | ICD-10-CM | POA: Diagnosis not present

## 2019-12-20 DIAGNOSIS — I2510 Atherosclerotic heart disease of native coronary artery without angina pectoris: Secondary | ICD-10-CM | POA: Diagnosis not present

## 2019-12-20 DIAGNOSIS — N185 Chronic kidney disease, stage 5: Secondary | ICD-10-CM | POA: Diagnosis not present

## 2019-12-20 DIAGNOSIS — K5900 Constipation, unspecified: Secondary | ICD-10-CM | POA: Diagnosis not present

## 2019-12-20 DIAGNOSIS — E785 Hyperlipidemia, unspecified: Secondary | ICD-10-CM | POA: Diagnosis not present

## 2019-12-20 DIAGNOSIS — D469 Myelodysplastic syndrome, unspecified: Secondary | ICD-10-CM

## 2019-12-20 DIAGNOSIS — K7460 Unspecified cirrhosis of liver: Secondary | ICD-10-CM | POA: Diagnosis not present

## 2019-12-20 DIAGNOSIS — E1122 Type 2 diabetes mellitus with diabetic chronic kidney disease: Secondary | ICD-10-CM | POA: Diagnosis not present

## 2019-12-20 DIAGNOSIS — F17210 Nicotine dependence, cigarettes, uncomplicated: Secondary | ICD-10-CM | POA: Diagnosis not present

## 2019-12-20 DIAGNOSIS — C946 Myelodysplastic disease, not classified: Secondary | ICD-10-CM

## 2019-12-20 DIAGNOSIS — Z7982 Long term (current) use of aspirin: Secondary | ICD-10-CM | POA: Diagnosis not present

## 2019-12-20 DIAGNOSIS — E538 Deficiency of other specified B group vitamins: Secondary | ICD-10-CM | POA: Diagnosis not present

## 2019-12-20 DIAGNOSIS — D631 Anemia in chronic kidney disease: Secondary | ICD-10-CM | POA: Diagnosis not present

## 2019-12-20 DIAGNOSIS — J439 Emphysema, unspecified: Secondary | ICD-10-CM | POA: Diagnosis not present

## 2019-12-20 DIAGNOSIS — E860 Dehydration: Secondary | ICD-10-CM | POA: Diagnosis not present

## 2019-12-20 DIAGNOSIS — R058 Other specified cough: Secondary | ICD-10-CM | POA: Diagnosis not present

## 2019-12-20 DIAGNOSIS — Z79899 Other long term (current) drug therapy: Secondary | ICD-10-CM | POA: Diagnosis not present

## 2019-12-20 DIAGNOSIS — I132 Hypertensive heart and chronic kidney disease with heart failure and with stage 5 chronic kidney disease, or end stage renal disease: Secondary | ICD-10-CM | POA: Diagnosis not present

## 2019-12-20 DIAGNOSIS — I714 Abdominal aortic aneurysm, without rupture: Secondary | ICD-10-CM | POA: Diagnosis not present

## 2019-12-20 DIAGNOSIS — R531 Weakness: Secondary | ICD-10-CM | POA: Diagnosis not present

## 2019-12-20 DIAGNOSIS — K219 Gastro-esophageal reflux disease without esophagitis: Secondary | ICD-10-CM | POA: Diagnosis not present

## 2019-12-20 LAB — CBC WITH DIFFERENTIAL (CANCER CENTER ONLY)
Abs Immature Granulocytes: 2.44 10*3/uL — ABNORMAL HIGH (ref 0.00–0.07)
Basophils Absolute: 0.4 10*3/uL — ABNORMAL HIGH (ref 0.0–0.1)
Basophils Relative: 2 %
Eosinophils Absolute: 0.8 10*3/uL — ABNORMAL HIGH (ref 0.0–0.5)
Eosinophils Relative: 4 %
HCT: 33.7 % — ABNORMAL LOW (ref 39.0–52.0)
Hemoglobin: 11 g/dL — ABNORMAL LOW (ref 13.0–17.0)
Immature Granulocytes: 11 %
Lymphocytes Relative: 16 %
Lymphs Abs: 3.4 10*3/uL (ref 0.7–4.0)
MCH: 26 pg (ref 26.0–34.0)
MCHC: 32.6 g/dL (ref 30.0–36.0)
MCV: 79.7 fL — ABNORMAL LOW (ref 80.0–100.0)
Monocytes Absolute: 0.5 10*3/uL (ref 0.1–1.0)
Monocytes Relative: 2 %
Neutro Abs: 14.3 10*3/uL — ABNORMAL HIGH (ref 1.7–7.7)
Neutrophils Relative %: 65 %
Platelet Count: 327 10*3/uL (ref 150–400)
RBC: 4.23 MIL/uL (ref 4.22–5.81)
RDW: 18.7 % — ABNORMAL HIGH (ref 11.5–15.5)
WBC Count: 21.9 10*3/uL — ABNORMAL HIGH (ref 4.0–10.5)
nRBC: 0.1 % (ref 0.0–0.2)

## 2019-12-20 LAB — BASIC METABOLIC PANEL - CANCER CENTER ONLY
Anion gap: 5 (ref 5–15)
BUN: 49 mg/dL — ABNORMAL HIGH (ref 8–23)
CO2: 25 mmol/L (ref 22–32)
Calcium: 8.6 mg/dL — ABNORMAL LOW (ref 8.9–10.3)
Chloride: 107 mmol/L (ref 98–111)
Creatinine: 1.76 mg/dL — ABNORMAL HIGH (ref 0.61–1.24)
GFR, Estimated: 39 mL/min — ABNORMAL LOW (ref >60)
Glucose, Bld: 122 mg/dL — ABNORMAL HIGH (ref 70–99)
Potassium: 4.3 mmol/L (ref 3.5–5.1)
Sodium: 137 mmol/L (ref 135–145)

## 2019-12-20 MED ORDER — SODIUM CHLORIDE 0.9 % IV SOLN
Freq: Once | INTRAVENOUS | Status: AC
  Filled 2019-12-20: qty 250

## 2019-12-20 NOTE — Progress Notes (Signed)
No injection today per Cira Rue NP. Parameters not met, Hgb 11 today

## 2019-12-20 NOTE — Progress Notes (Signed)
[place]   [phone] [phone]   Clinic Follow up Note   Patient Care Team: Paul Lung, MD as PCP - General (Family Medicine) Burnell Blanks, MD as PCP - Cardiology (Cardiology) 12/20/2019  CHIEF COMPLAINT: Weakness, fatigue  CURRENT THERAPY: For MDS/MPN: Retacrit monthly started 10/30/18. Increase to every 2 weeks starting 01/29/19. Increased to weekly on 05/07/19.Decreased back to every 2 weeks from 07/04/19. Increased to 30K and q10 days 08/09/19  INTERVAL HISTORY: Paul [name] presents for unscheduled visit due to feeling weak and tired.  He was here for routine lab and Retacrit, told the flush nurse he felt weak and lethargic.  He presents in a wheelchair.  He notes since hospital discharge on 11/4 for pneumonia he returned to his baseline and felt well, eating and drinking normally, completing all his normal activities and taking routine meds including new lasix daily prescribed at discharge. This morning he felt "something did not feel right"  after his shower.  He could not drive here today.  He reports drinking a pot of coffee and a Coke 0 in the last 24 hours, doesn't usually drink much water.  Denies dizziness.  His appetite is low.  He gets fatigued with minimal exertion.  Denies recurrent fever, chills, cough, chest pain, n/v/c/d, dysuria, pain, dizziness or new/worsening pain   MEDICAL HISTORY:  Past Medical History:  Diagnosis Date  . AAA (abdominal aortic aneurysm) ([place])   . Abnormal LFTs   . AKI (acute kidney injury) ([place])   . Anemia of chronic disease   . Arthritis    "all over" (12/04/2015)  . Atherosclerosis of native coronary artery of native heart without angina pectoris   . CAD (coronary artery disease)    with cath 09/26/08 showing severe heart disease in RCA with POBA, mild to mod diesease in circ and LAD  . Chronic back pain    back problems prior to surgery   . Constipation   . Coronary atherosclerosis of  native coronary artery   . Dehydration   . Diabetes mellitus    with neuropathy  . Diarrhea   . Elevated liver enzymes   . Generalized weakness   . Gout of right hand   . Hiatal hernia   . History of gastroesophageal reflux (GERD)   . History of stomach ulcers   . Hyperlipidemia   . Hypertension   . Metabolic acidosis   . Normochromic anemia 02/23/2016  . 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 Thrombocytopenia ([place])   . Unintentional weight loss 09/27/2017   33 lbs between 4/19 & 8/19  . Vitamin B12 deficiency   . Vitamin B12 deficiency   . Vitamin D deficiency   . Weakness     SURGICAL HISTORY: Past Surgical History:  Procedure Laterality Date  . ANKLE FRACTURE SURGERY Left    "crushed"  . BACK SURGERY  X 3   "fell 4 stories in 1977"  . CARPAL TUNNEL RELEASE Left   . CORONARY ANGIOPLASTY WITH STENT PLACEMENT  09/26/2008   [name] 06/03/2010  . FRACTURE SURGERY    . LEFT HEART CATHETERIZATION WITH CORONARY ANGIOGRAM N/A 01/21/2012   Procedure: LEFT HEART CATHETERIZATION WITH CORONARY ANGIOGRAM;  Surgeon: Burnell Blanks, MD;  Location: [facility] CATH LAB;  Service: Cardiovascular;  Laterality: N/A;  . LUMBAR [place]; ~ 1980  . POSTERIOR LUMBAR FUSION  ~ 1998   "3rd OR to reconstruct my back after fall in 1977"  . 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  ALLERGIES:  is allergic to allopurinol and metformin and related.  MEDICATIONS:  Current Outpatient Medications  Medication Sig Dispense Refill  . amLODipine (NORVASC) 5 MG tablet Take 1 tablet (5 mg total) by mouth daily. 90 tablet 3  . aspirin 81 MG chewable tablet Chew 81 mg by mouth daily.    . clopidogrel (PLAVIX) 75 MG tablet TAKE 1 TABLET BY MOUTH  DAILY 90 tablet 0  . ENBREL SURECLICK 50 MG/ML injection Inject 50 mg into the skin once a week.     . febuxostat (ULORIC) 40 MG tablet Take 40 mg  by mouth daily.    . fish oil-omega-3 fatty acids 1000 MG capsule Take 1 g by mouth 2 (two) times daily.     . furosemide (LASIX) 40 MG tablet Take 1 tablet (40 mg total) by mouth daily. 30 tablet 0  . glucose blood (ACCU-CHEK AVIVA PLUS) test strip Test once to twice daily 200 each 4  . isosorbide mononitrate (IMDUR) 60 MG 24 hr tablet Take 1 tablet (60 mg total) by mouth at bedtime. 30 tablet 0  . leflunomide (ARAVA) 20 MG tablet Take 20 mg by mouth daily.    . metoprolol tartrate (LOPRESSOR) 25 MG tablet TAKE 1 TABLET BY MOUTH  TWICE DAILY 180 tablet 2  . Multiple Vitamins-Minerals (CENTRUM) tablet Take 1 tablet by mouth daily.    . nitroGLYCERIN (NITROSTAT) 0.4 MG SL tablet Place 0.4 mg under the tongue every 5 (five) minutes as needed for chest pain. For 3 doses    . pyridoxine (B-6) 100 MG tablet Take 100 mg by mouth daily.    . silver sulfADIAZINE (SILVADENE) 1 % cream Apply pea-sized amount to wound daily. 50 g 0  . simvastatin (ZOCOR) 20 MG tablet Take 1 tablet (20 mg total) by mouth at bedtime. 90 tablet 3   No current facility-administered medications for this visit.    PHYSICAL EXAMINATION:  Vitals:   12/20/19 1415 12/20/19 1615  BP: (!) 116/58 (!) 120/52  Pulse: 69 65  Resp: 19 18  Temp: 97.8 F (36.6 C) (!) 97.4 F (36.3 C)  SpO2: 99% 100%   There were no vitals filed for this visit.  GENERAL:alert, no distress and comfortable SKIN: no rash  EYES: sclera clear LUNGS: clear with normal breathing effort HEART: regular rate & rhythm, no lower extremity edema NEURO: alert & oriented x 3 with fluent speech, mild weakness  LABORATORY DATA:  I have reviewed the data as listed CBC Latest Ref Rng & Units 12/20/2019 12/10/2019 12/10/2019  WBC 4.0 - 10.5 K/uL 21.9(H) 14.2(H) 15.5(H)  Hemoglobin 13.0 - 17.0 g/dL 11.0(L) 10.3(L) 10.1(L)  Hematocrit 39 - 52 % 33.7(L) 31.8(L) 30.8(L)  Platelets 150 - 400 K/uL 327 303 262     CMP Latest Ref Rng & Units 12/20/2019 12/10/2019  12/05/2019  Glucose 70 - 99 mg/dL 122(H) 151(H) 113(H)  BUN 8 - 23 mg/dL 49(H) 43(H) 33(H)  Creatinine 0.61 - 1.24 mg/dL 1.76(H) 1.57(H) 1.41(H)  Sodium 135 - 145 mmol/L 137 138 133(L)  Potassium 3.5 - 5.1 mmol/L 4.3 4.7 4.4  Chloride 98 - 111 mmol/L 107 105 103  CO2 22 - 32 mmol/L 25 23 22   Calcium 8.9 - 10.3 mg/dL 8.6(L) 8.9 8.6(L)  Total Protein 6.0 - 8.5 g/dL - 7.8 -  Total Bilirubin 0.0 - 1.2 mg/dL -  0.8 -  Alkaline Phos 44 - 121 IU/L - 115 -  AST 0 - 40 IU/L - 29 -  ALT 0 - 44 IU/L - 22 -      RADIOGRAPHIC STUDIES: I have personally reviewed the radiological images as listed and agreed with the findings in the report. No results found.   ASSESSMENT & PLAN: 79 yo male with   1. Weakness, weight loss, low BP -he was recently hospitalized 10/30 - 11/4 for CAP, recovered to baseline after discharge -acutely worse this morning, fatigue, exertional dyspnea, weakness -BP sitting 118/47, standing 107/53, normal HR. No dizziness -he already took BP meds and lasix today -Bmet with worsening renal function Scr up to 1.76. symptoms are likely secondary to dehydration from lasix, low po intake.  -CBC shows Hg 11, likely r/t dehydration as baseline is 9-10. Hold hold Retacrit -500 cc NS today, hold lasix. And increase po liquids at home. He improved significantly after fluids -WBC 21, possibly reactive to recent infection ? Significance unclear in the setting of chronic leukocytosis  -unclear why he has anorexia and weight loss, return to PCP  2 . MDS/MPN, JAK2 (-), cytogenetics normal -Hehas had anemia since mid 2017 andHghasin 9-10 range most of time, he also has mild leukocytosis,no thrombocytosis -Other contributions to his anemia include stage III chronic renal insufficiency and chronic gouty arthritis, heart disease, and pulmonary fibrosis.Previous iron study and B12 level were normal. She was on oral iron, off sinceearly 2019 due to constipation. -[name] did  fairly exhaustive evaluation to exclude occult malignancyincluding CT CAPwhichwas negative -11/4/19Bone marrow is consistent with a myeloproliferative disorder with dysplastic features. -due to his worsening anemia in 10/2018 he started monthly retacrit which has been titrated periodically, now on 30K q10 days  -hold Retacrit today for Hg 11, return 11/29 for lab and inj. Plan to f/u prior to monitor status   3. Recent CAP hospitalization 10/30 - 11/4 -Imaging showed bilateral infiltrates, required short term supplemental oxygen -completed antibiotics -started on lasix at discharge ? Likely contributing to symptoms of dehydration today, will hold  -Recovered to baseline, seen by PCP 11/8. Scr slightly worse otherwise doing well   4. AAA, CAD with stent placed, h/o provoked RLE DVT, mild liver cirrhosis, gout, emphysema  -f/u Dr. Doren Custard vascular surgeon and PCP Dr. Redmond School for chronic conditions   PLAN: -Labs reviewed -Hold Retacrit Hg 11 -Scr 1.76, increased from baseline. 500 cc NS and hold lasix -F/u PCP -Lab, f/u, injection 11/29  -he knows to call if he has worsening symptoms or fails to recover well   No problem-specific Assessment & Plan notes found for this encounter.   Orders Placed This Encounter  Procedures  . Basic Metabolic Panel - [place] Only    Standing Status:   Future    Number of Occurrences:   1    Standing Expiration Date:   12/19/2020   All questions were answered. The patient knows to call the clinic with any problems, questions or concerns. No barriers to learning were detected. Total encounter time was 35 minutes.      Alla Feeling, NP 12/20/19

## 2019-12-24 ENCOUNTER — Telehealth: Payer: Self-pay | Admitting: Hematology

## 2019-12-24 NOTE — Telephone Encounter (Signed)
Scheduled appts per 11/18 los and 11/19 staff msg. Pt confirmed appt date and time.

## 2019-12-31 ENCOUNTER — Inpatient Hospital Stay (HOSPITAL_BASED_OUTPATIENT_CLINIC_OR_DEPARTMENT_OTHER): Payer: Medicare Other | Admitting: Nurse Practitioner

## 2019-12-31 ENCOUNTER — Inpatient Hospital Stay: Payer: Medicare Other

## 2019-12-31 ENCOUNTER — Other Ambulatory Visit: Payer: Self-pay

## 2019-12-31 ENCOUNTER — Other Ambulatory Visit: Payer: Self-pay | Admitting: Nurse Practitioner

## 2019-12-31 ENCOUNTER — Encounter: Payer: Self-pay | Admitting: Nurse Practitioner

## 2019-12-31 VITALS — BP 152/49 | HR 67 | Temp 98.7°F | Resp 18 | Ht 70.0 in | Wt 178.3 lb

## 2019-12-31 DIAGNOSIS — C946 Myelodysplastic disease, not classified: Secondary | ICD-10-CM

## 2019-12-31 DIAGNOSIS — Z7982 Long term (current) use of aspirin: Secondary | ICD-10-CM | POA: Diagnosis not present

## 2019-12-31 DIAGNOSIS — N185 Chronic kidney disease, stage 5: Secondary | ICD-10-CM | POA: Diagnosis not present

## 2019-12-31 DIAGNOSIS — D469 Myelodysplastic syndrome, unspecified: Secondary | ICD-10-CM

## 2019-12-31 DIAGNOSIS — D638 Anemia in other chronic diseases classified elsewhere: Secondary | ICD-10-CM

## 2019-12-31 DIAGNOSIS — Z86718 Personal history of other venous thrombosis and embolism: Secondary | ICD-10-CM | POA: Diagnosis not present

## 2019-12-31 DIAGNOSIS — Z79899 Other long term (current) drug therapy: Secondary | ICD-10-CM | POA: Diagnosis not present

## 2019-12-31 DIAGNOSIS — D631 Anemia in chronic kidney disease: Secondary | ICD-10-CM | POA: Diagnosis not present

## 2019-12-31 DIAGNOSIS — I714 Abdominal aortic aneurysm, without rupture: Secondary | ICD-10-CM | POA: Diagnosis not present

## 2019-12-31 DIAGNOSIS — I2510 Atherosclerotic heart disease of native coronary artery without angina pectoris: Secondary | ICD-10-CM | POA: Diagnosis not present

## 2019-12-31 DIAGNOSIS — K219 Gastro-esophageal reflux disease without esophagitis: Secondary | ICD-10-CM | POA: Diagnosis not present

## 2019-12-31 DIAGNOSIS — R058 Other specified cough: Secondary | ICD-10-CM | POA: Diagnosis not present

## 2019-12-31 DIAGNOSIS — F17210 Nicotine dependence, cigarettes, uncomplicated: Secondary | ICD-10-CM | POA: Diagnosis not present

## 2019-12-31 DIAGNOSIS — J439 Emphysema, unspecified: Secondary | ICD-10-CM | POA: Diagnosis not present

## 2019-12-31 DIAGNOSIS — E785 Hyperlipidemia, unspecified: Secondary | ICD-10-CM | POA: Diagnosis not present

## 2019-12-31 DIAGNOSIS — E1122 Type 2 diabetes mellitus with diabetic chronic kidney disease: Secondary | ICD-10-CM | POA: Diagnosis not present

## 2019-12-31 DIAGNOSIS — K5900 Constipation, unspecified: Secondary | ICD-10-CM | POA: Diagnosis not present

## 2019-12-31 DIAGNOSIS — E538 Deficiency of other specified B group vitamins: Secondary | ICD-10-CM | POA: Diagnosis not present

## 2019-12-31 DIAGNOSIS — K7460 Unspecified cirrhosis of liver: Secondary | ICD-10-CM | POA: Diagnosis not present

## 2019-12-31 DIAGNOSIS — I132 Hypertensive heart and chronic kidney disease with heart failure and with stage 5 chronic kidney disease, or end stage renal disease: Secondary | ICD-10-CM | POA: Diagnosis not present

## 2019-12-31 DIAGNOSIS — R531 Weakness: Secondary | ICD-10-CM | POA: Diagnosis not present

## 2019-12-31 LAB — CBC WITH DIFFERENTIAL (CANCER CENTER ONLY)
Abs Immature Granulocytes: 0.18 10*3/uL — ABNORMAL HIGH (ref 0.00–0.07)
Basophils Absolute: 0.3 10*3/uL — ABNORMAL HIGH (ref 0.0–0.1)
Basophils Relative: 2 %
Eosinophils Absolute: 0.7 10*3/uL — ABNORMAL HIGH (ref 0.0–0.5)
Eosinophils Relative: 5 %
HCT: 29.5 % — ABNORMAL LOW (ref 39.0–52.0)
Hemoglobin: 9.7 g/dL — ABNORMAL LOW (ref 13.0–17.0)
Immature Granulocytes: 1 %
Lymphocytes Relative: 22 %
Lymphs Abs: 3.1 10*3/uL (ref 0.7–4.0)
MCH: 26.4 pg (ref 26.0–34.0)
MCHC: 32.9 g/dL (ref 30.0–36.0)
MCV: 80.2 fL (ref 80.0–100.0)
Monocytes Absolute: 0.5 10*3/uL (ref 0.1–1.0)
Monocytes Relative: 3 %
Neutro Abs: 9 10*3/uL — ABNORMAL HIGH (ref 1.7–7.7)
Neutrophils Relative %: 67 %
Platelet Count: 280 10*3/uL (ref 150–400)
RBC: 3.68 MIL/uL — ABNORMAL LOW (ref 4.22–5.81)
RDW: 18.6 % — ABNORMAL HIGH (ref 11.5–15.5)
WBC Count: 13.7 10*3/uL — ABNORMAL HIGH (ref 4.0–10.5)
nRBC: 0 % (ref 0.0–0.2)

## 2019-12-31 LAB — BASIC METABOLIC PANEL - CANCER CENTER ONLY
Anion gap: 5 (ref 5–15)
BUN: 24 mg/dL — ABNORMAL HIGH (ref 8–23)
CO2: 20 mmol/L — ABNORMAL LOW (ref 22–32)
Calcium: 8.9 mg/dL (ref 8.9–10.3)
Chloride: 111 mmol/L (ref 98–111)
Creatinine: 1.28 mg/dL — ABNORMAL HIGH (ref 0.61–1.24)
GFR, Estimated: 58 mL/min — ABNORMAL LOW (ref >60)
Glucose, Bld: 123 mg/dL — ABNORMAL HIGH (ref 70–99)
Potassium: 4.4 mmol/L (ref 3.5–5.1)
Sodium: 136 mmol/L (ref 135–145)

## 2019-12-31 MED ORDER — EPOETIN ALFA-EPBX 10000 UNIT/ML IJ SOLN
30000.0000 [IU] | INTRAMUSCULAR | Status: DC
  Administered 2019-12-31: 30000 [IU] via SUBCUTANEOUS

## 2019-12-31 MED ORDER — EPOETIN ALFA-EPBX 10000 UNIT/ML IJ SOLN
INTRAMUSCULAR | Status: AC
  Filled 2019-12-31: qty 3

## 2019-12-31 NOTE — Progress Notes (Signed)
[facility]   Telephone:(336) [phone] Fax:(336) [phone]   Clinic Follow up Note   Patient Care Team: Denita Lung, MD as PCP - General (Family Medicine) Burnell Blanks, MD as PCP - Cardiology (Cardiology) 12/31/2019  CHIEF COMPLAINT: Follow up weakness and Fatigue from 12/20/19, MDS/MPN   CURRENT THERAPY:  For MDS/MPN: Retacrit monthly started 10/30/18.Increase to every 2 weeks starting 01/29/19. Increased to weekly on 05/07/19.Decreased back to every 2 weeks from 07/04/19. Increased to 30K and q10 days 08/09/19  INTERVAL HISTORY: Paul [name] returns for follow up. He was seen for symptom management visit on 12/20/19 due to weakness and fatigue. Labs showed Hg 11.0 and Scr 1.76 (from baseline 1.4 - 1.5). He was hypotensive. Retacrit was held and he was given IV fluids. I advised him to stop lasix. Today he presents to monitor his condition.   He feels a little bit better, no longer requiring a wheelchair.  His main concern is right lower back pain which has been bothering him about 6 months.  Has been trying heat and Tylenol 2 tabs 4 times a day without much relief.  And he was in the hospital for CAP he was taken off Enbrel.  Pain has been stable since then.  Not sleeping much, this is impacting his quality of life.  Denies numbness or significant weakness.  He remains able to be independent and drive himself.  Otherwise, no new changes.  Denies fever or chills.  He has a productive cough with white phlegm, he continues smoking.  No interest in quitting.  Denies any other new concerns.   MEDICAL HISTORY:  Past Medical History:  Diagnosis Date  . AAA (abdominal aortic aneurysm) ([place])   . Abnormal LFTs   . AKI (acute kidney injury) ([place])   . Anemia of chronic disease   . Arthritis    "all over" (12/04/2015)  . Atherosclerosis of native coronary artery of native heart without angina pectoris   . CAD (coronary artery disease)    with cath 09/26/08 showing severe  heart disease in RCA with POBA, mild to mod diesease in circ and LAD  . Chronic back pain    back problems prior to surgery   . Constipation   . Coronary atherosclerosis of native coronary artery   . Dehydration   . Diabetes mellitus    with neuropathy  . Diarrhea   . Elevated liver enzymes   . Generalized weakness   . Gout of right hand   . Hiatal hernia   . History of gastroesophageal reflux (GERD)   . History of stomach ulcers   . Hyperlipidemia   . Hypertension   . Metabolic acidosis   . Normochromic anemia 02/23/2016  . 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 Thrombocytopenia ([place])   . Unintentional weight loss 09/27/2017   33 lbs between 4/19 & 8/19  . Vitamin B12 deficiency   . Vitamin B12 deficiency   . Vitamin D deficiency   . Weakness     SURGICAL HISTORY: Past Surgical History:  Procedure Laterality Date  . ANKLE FRACTURE SURGERY Left    "crushed"  . BACK SURGERY  X 3   "fell 4 stories in 1977"  . CARPAL TUNNEL RELEASE Left   . CORONARY ANGIOPLASTY WITH STENT PLACEMENT  09/26/2008   [name] 06/03/2010  . FRACTURE SURGERY    . LEFT HEART CATHETERIZATION WITH CORONARY ANGIOGRAM N/A 01/21/2012   Procedure: LEFT HEART CATHETERIZATION WITH CORONARY ANGIOGRAM;  Surgeon: Burnell Blanks, MD;  Location: [facility] CATH LAB;  Service: Cardiovascular;  Laterality: N/A;  . LUMBAR [place]; ~ 1980  . POSTERIOR LUMBAR FUSION  ~ 1998   "3rd OR to reconstruct my back after fall in 1977"  . 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  ALLERGIES:  is allergic to allopurinol and metformin and related.  MEDICATIONS:  Current Outpatient Medications  Medication Sig Dispense Refill  . amLODipine (NORVASC) 5 MG tablet Take 1 tablet (5 mg total) by mouth daily. 90 tablet 3  . aspirin 81 MG chewable tablet Chew 81 mg by mouth daily.    . clopidogrel (PLAVIX) 75 MG  tablet TAKE 1 TABLET BY MOUTH  DAILY 90 tablet 0  . ENBREL SURECLICK 50 MG/ML injection Inject 50 mg into the skin once a week.     . febuxostat (ULORIC) 40 MG tablet Take 40 mg by mouth daily.    . fish oil-omega-3 fatty acids 1000 MG capsule Take 1 g by mouth 2 (two) times daily.     . furosemide (LASIX) 40 MG tablet Take 1 tablet (40 mg total) by mouth daily. 30 tablet 0  . glucose blood (ACCU-CHEK AVIVA PLUS) test strip Test once to twice daily 200 each 4  . isosorbide mononitrate (IMDUR) 60 MG 24 hr tablet Take 1 tablet (60 mg total) by mouth at bedtime. 30 tablet 0  . leflunomide (ARAVA) 20 MG tablet Take 20 mg by mouth daily.    . metoprolol tartrate (LOPRESSOR) 25 MG tablet TAKE 1 TABLET BY MOUTH  TWICE DAILY 180 tablet 2  . Multiple Vitamins-Minerals (CENTRUM) tablet Take 1 tablet by mouth daily.    . nitroGLYCERIN (NITROSTAT) 0.4 MG SL tablet Place 0.4 mg under the tongue every 5 (five) minutes as needed for chest pain. For 3 doses    . pyridoxine (B-6) 100 MG tablet Take 100 mg by mouth daily.    . silver sulfADIAZINE (SILVADENE) 1 % cream Apply pea-sized amount to wound daily. 50 g 0  . simvastatin (ZOCOR) 20 MG tablet Take 1 tablet (20 mg total) by mouth at bedtime. 90 tablet 3   No current facility-administered medications for this visit.   Facility-Administered Medications Ordered in Other Visits  Medication Dose Route Frequency Provider Last Rate Last Admin  . epoetin alfa-epbx (RETACRIT) injection 30,000 Units  30,000 Units Subcutaneous See admin instructions Truitt Merle, MD   30,000 Units at 12/31/19 1205    PHYSICAL EXAMINATION: ECOG PERFORMANCE STATUS: 1 - Symptomatic but completely ambulatory  Vitals:   12/31/19 1120  BP: (!) 152/49  Pulse: 67  Resp: 18  Temp: 98.7 F (37.1 C)  SpO2: 97%   Filed Weights   12/31/19 1120  Weight: 178 lb 4.8 oz (80.9 kg)    GENERAL:alert, no distress and comfortable SKIN: no rash  EYES:  sclera clear LUNGS: clear with normal  breathing effort HEART: regular rate & rhythm, no lower extremity edema Musculoskeletal: Tenderness in the right lower lumbar area NEURO: alert & oriented x 3 with fluent speech, no focal motor/sensory deficits  LABORATORY DATA:  I have reviewed the data as listed CBC Latest Ref Rng & Units 12/31/2019 12/20/2019 12/10/2019  WBC 4.0 - 10.5 K/uL 13.7(H) 21.9(H) 14.2(H)  Hemoglobin 13.0 - 17.0 g/dL 9.7(L) 11.0(L) 10.3(L)  Hematocrit 39 - 52 % 29.5(L) 33.7(L) 31.8(L)  Platelets 150 - 400  K/uL 280 327 303     CMP Latest Ref Rng & Units 12/31/2019 12/20/2019 12/10/2019  Glucose 70 - 99 mg/dL 123(H) 122(H) 151(H)  BUN 8 - 23 mg/dL 24(H) 49(H) 43(H)  Creatinine 0.61 - 1.24 mg/dL 1.28(H) 1.76(H) 1.57(H)  Sodium 135 - 145 mmol/L 136 137 138  Potassium 3.5 - 5.1 mmol/L 4.4 4.3 4.7  Chloride 98 - 111 mmol/L 111 107 105  CO2 22 - 32 mmol/L 20(L) 25 23  Calcium 8.9 - 10.3 mg/dL 8.9 8.6(L) 8.9  Total Protein 6.0 - 8.5 g/dL - - 7.8  Total Bilirubin 0.0 - 1.2 mg/dL - - 0.8  Alkaline Phos 44 - 121 IU/L - - 115  AST 0 - 40 IU/L - - 29  ALT 0 - 44 IU/L - - 22      RADIOGRAPHIC STUDIES: I have personally reviewed the radiological images as listed and agreed with the findings in the report. No results found.   ASSESSMENT & PLAN: 79 yo male with   1. Weakness, weight loss, low BP -he was recently hospitalized 10/30 - 11/4 for CAP, recovered to baseline after discharge -he was evaluated 11/18 for acute weakness, fatigue, exertional dyspnea -BP sitting 118/47, standing 107/53, normal HR after taking BP meds and lasix. No dizziness -Bmet showed worsening renal function Scr up to 1.76.  -Symptoms felt to be secondary to dehydration from lasix, low po intake.  -CBC shows Hg 11, likely r/t dehydration as baseline is 9-10. Retacrit was held 12/20/19 -he was given 500 cc NS, lasix d/c'd, and encouraged to increase po liquids at home. He improved significantly after fluids -WBC 21, possibly reactive to  recent infection ? Significance unclear in the setting of chronic leukocytosis  -unclear why he has anorexia and weight loss, return to PCP  2 . MDS/MPN, JAK2 (-), cytogenetics normal -Hehas had anemia since mid 2017 andHghasin 9-10 range most of time, he also has mild leukocytosis,no thrombocytosis -Other contributions to his anemia include stage III chronic renal insufficiency and chronic gouty arthritis, heart disease, and pulmonary fibrosis.Previous iron study and B12 level were normal. She was on oral iron, off sinceearly 2019due to constipation. -[name] did fairly exhaustive evaluation to exclude occult malignancyincluding CT CAPwhichwas negative -11/4/19Bone marrow is consistent with a myeloproliferative disorder with dysplastic features. -due to his worsening anemia in 10/2018 he started monthly retacrit which has been titrated periodically, now on 30K q10 days  -Retacrit  Held 11/18/21for Hg 11  3. Recent CAP hospitalization 10/30 - 11/4 -Imaging showed bilateral infiltrates, required short term supplemental oxygen -completed antibiotics -started on lasix at discharge ? Likely contributing to symptoms of dehydration today, will hold  -Recovered to baseline, seen by PCP 11/8. Scr slightly worse otherwise doing well   4. Chronic low back pain -Onset 6 months ago.  -managing with heat, tylenol PRN per Dr. Redmond School -[facility] MRI 12/04/19 showed chronic compression fractures at L2-L4 and disc bulging from L2-S1 with arthropathy, mild stenosis and degenerative changes -I offered him referral to Ortho versus neurosurgery today, he plans to discuss with Dr. Redmond School at next visit  5. AAA, CAD with stent placed, h/o provoked RLE DVT, mild liver cirrhosis, gout, emphysema  -f/u Dr. Doren Custard vascular surgeon and PCP Dr. Redmond School for chronic conditions   Disposition Mr. Losasso appears improved.  His weakness and fatigue improved.  Dyspnea resolved. Weight stable from  last visit. His CBC is back to baseline, Scr improved significantly.  He remains off Lasix and has improved his  oral hydration.  He will proceed with Retacrit 30K units today and continue lab/injection every 10 days.  He will follow-up on 12/29.   We will continue follow-up with PCP and other providers as scheduled, plans to discuss referral to ortho vs neuro for back pain at next f/up with Dr. Redmond School.   All questions were answered. The patient knows to call the clinic with any problems, questions or concerns. No barriers to learning were detected.     Paul Feeling, NP 12/31/19

## 2019-12-31 NOTE — Patient Instructions (Signed)

## 2020-01-04 ENCOUNTER — Ambulatory Visit: Payer: Medicare Other | Admitting: Podiatry

## 2020-01-10 ENCOUNTER — Inpatient Hospital Stay: Payer: Medicare Other

## 2020-01-10 ENCOUNTER — Other Ambulatory Visit: Payer: Self-pay

## 2020-01-10 ENCOUNTER — Inpatient Hospital Stay: Payer: Medicare Other | Attending: Hematology

## 2020-01-10 VITALS — BP 138/62

## 2020-01-10 DIAGNOSIS — K7469 Other cirrhosis of liver: Secondary | ICD-10-CM | POA: Insufficient documentation

## 2020-01-10 DIAGNOSIS — I2510 Atherosclerotic heart disease of native coronary artery without angina pectoris: Secondary | ICD-10-CM | POA: Diagnosis not present

## 2020-01-10 DIAGNOSIS — C946 Myelodysplastic disease, not classified: Secondary | ICD-10-CM

## 2020-01-10 DIAGNOSIS — N183 Chronic kidney disease, stage 3 unspecified: Secondary | ICD-10-CM | POA: Diagnosis not present

## 2020-01-10 DIAGNOSIS — R634 Abnormal weight loss: Secondary | ICD-10-CM | POA: Diagnosis not present

## 2020-01-10 DIAGNOSIS — R630 Anorexia: Secondary | ICD-10-CM | POA: Insufficient documentation

## 2020-01-10 DIAGNOSIS — Z8701 Personal history of pneumonia (recurrent): Secondary | ICD-10-CM | POA: Insufficient documentation

## 2020-01-10 DIAGNOSIS — Z7982 Long term (current) use of aspirin: Secondary | ICD-10-CM | POA: Insufficient documentation

## 2020-01-10 DIAGNOSIS — E119 Type 2 diabetes mellitus without complications: Secondary | ICD-10-CM | POA: Insufficient documentation

## 2020-01-10 DIAGNOSIS — D469 Myelodysplastic syndrome, unspecified: Secondary | ICD-10-CM | POA: Diagnosis not present

## 2020-01-10 DIAGNOSIS — E860 Dehydration: Secondary | ICD-10-CM | POA: Diagnosis not present

## 2020-01-10 DIAGNOSIS — M545 Low back pain, unspecified: Secondary | ICD-10-CM | POA: Insufficient documentation

## 2020-01-10 DIAGNOSIS — E538 Deficiency of other specified B group vitamins: Secondary | ICD-10-CM | POA: Insufficient documentation

## 2020-01-10 DIAGNOSIS — Z79899 Other long term (current) drug therapy: Secondary | ICD-10-CM | POA: Insufficient documentation

## 2020-01-10 DIAGNOSIS — I714 Abdominal aortic aneurysm, without rupture: Secondary | ICD-10-CM | POA: Diagnosis not present

## 2020-01-10 DIAGNOSIS — D638 Anemia in other chronic diseases classified elsewhere: Secondary | ICD-10-CM

## 2020-01-10 DIAGNOSIS — G629 Polyneuropathy, unspecified: Secondary | ICD-10-CM | POA: Diagnosis not present

## 2020-01-10 DIAGNOSIS — E785 Hyperlipidemia, unspecified: Secondary | ICD-10-CM | POA: Diagnosis not present

## 2020-01-10 DIAGNOSIS — K5900 Constipation, unspecified: Secondary | ICD-10-CM | POA: Diagnosis not present

## 2020-01-10 DIAGNOSIS — I132 Hypertensive heart and chronic kidney disease with heart failure and with stage 5 chronic kidney disease, or end stage renal disease: Secondary | ICD-10-CM | POA: Insufficient documentation

## 2020-01-10 DIAGNOSIS — I10 Essential (primary) hypertension: Secondary | ICD-10-CM | POA: Insufficient documentation

## 2020-01-10 DIAGNOSIS — K219 Gastro-esophageal reflux disease without esophagitis: Secondary | ICD-10-CM | POA: Insufficient documentation

## 2020-01-10 DIAGNOSIS — D631 Anemia in chronic kidney disease: Secondary | ICD-10-CM | POA: Diagnosis not present

## 2020-01-10 DIAGNOSIS — E559 Vitamin D deficiency, unspecified: Secondary | ICD-10-CM | POA: Diagnosis not present

## 2020-01-10 LAB — CBC WITH DIFFERENTIAL (CANCER CENTER ONLY)
Abs Immature Granulocytes: 0.24 10*3/uL — ABNORMAL HIGH (ref 0.00–0.07)
Basophils Absolute: 0.2 10*3/uL — ABNORMAL HIGH (ref 0.0–0.1)
Basophils Relative: 1 %
Eosinophils Absolute: 0.5 10*3/uL (ref 0.0–0.5)
Eosinophils Relative: 3 %
HCT: 31.2 % — ABNORMAL LOW (ref 39.0–52.0)
Hemoglobin: 10.3 g/dL — ABNORMAL LOW (ref 13.0–17.0)
Immature Granulocytes: 2 %
Lymphocytes Relative: 21 %
Lymphs Abs: 3 10*3/uL (ref 0.7–4.0)
MCH: 26.5 pg (ref 26.0–34.0)
MCHC: 33 g/dL (ref 30.0–36.0)
MCV: 80.4 fL (ref 80.0–100.0)
Monocytes Absolute: 0.4 10*3/uL (ref 0.1–1.0)
Monocytes Relative: 3 %
Neutro Abs: 10.4 10*3/uL — ABNORMAL HIGH (ref 1.7–7.7)
Neutrophils Relative %: 70 %
Platelet Count: 201 10*3/uL (ref 150–400)
RBC: 3.88 MIL/uL — ABNORMAL LOW (ref 4.22–5.81)
RDW: 18.9 % — ABNORMAL HIGH (ref 11.5–15.5)
WBC Count: 14.8 10*3/uL — ABNORMAL HIGH (ref 4.0–10.5)
nRBC: 0 % (ref 0.0–0.2)

## 2020-01-10 LAB — IRON AND TIBC
Iron: 55 ug/dL (ref 42–163)
Saturation Ratios: 28 % (ref 20–55)
TIBC: 199 ug/dL — ABNORMAL LOW (ref 202–409)
UIBC: 144 ug/dL (ref 117–376)

## 2020-01-10 LAB — FERRITIN: Ferritin: 141 ng/mL (ref 24–336)

## 2020-01-10 MED ORDER — EPOETIN ALFA-EPBX 10000 UNIT/ML IJ SOLN
INTRAMUSCULAR | Status: AC
  Filled 2020-01-10: qty 3

## 2020-01-10 MED ORDER — EPOETIN ALFA-EPBX 10000 UNIT/ML IJ SOLN
30000.0000 [IU] | INTRAMUSCULAR | Status: DC
  Administered 2020-01-10: 30000 [IU] via SUBCUTANEOUS

## 2020-01-10 NOTE — Patient Instructions (Signed)

## 2020-01-11 ENCOUNTER — Other Ambulatory Visit: Payer: Medicare Other

## 2020-01-11 DIAGNOSIS — Z7689 Persons encountering health services in other specified circumstances: Secondary | ICD-10-CM | POA: Diagnosis not present

## 2020-01-11 DIAGNOSIS — D638 Anemia in other chronic diseases classified elsewhere: Secondary | ICD-10-CM | POA: Diagnosis not present

## 2020-01-12 LAB — CBC WITH DIFFERENTIAL/PLATELET
Basophils Absolute: 0.2 10*3/uL (ref 0.0–0.2)
Basos: 2 %
EOS (ABSOLUTE): 0.5 10*3/uL — ABNORMAL HIGH (ref 0.0–0.4)
Eos: 4 %
Hematocrit: 31.5 % — ABNORMAL LOW (ref 37.5–51.0)
Hemoglobin: 10.4 g/dL — ABNORMAL LOW (ref 13.0–17.7)
Immature Grans (Abs): 0.3 10*3/uL — ABNORMAL HIGH (ref 0.0–0.1)
Immature Granulocytes: 2 %
Lymphocytes Absolute: 3 10*3/uL (ref 0.7–3.1)
Lymphs: 21 %
MCH: 25.9 pg — ABNORMAL LOW (ref 26.6–33.0)
MCHC: 33 g/dL (ref 31.5–35.7)
MCV: 79 fL (ref 79–97)
Monocytes Absolute: 0.4 10*3/uL (ref 0.1–0.9)
Monocytes: 3 %
Neutrophils Absolute: 9.7 10*3/uL — ABNORMAL HIGH (ref 1.4–7.0)
Neutrophils: 68 %
Platelets: 198 10*3/uL (ref 150–450)
RBC: 4.01 x10E6/uL — ABNORMAL LOW (ref 4.14–5.80)
RDW: 17 % — ABNORMAL HIGH (ref 11.6–15.4)
WBC: 14.1 10*3/uL — ABNORMAL HIGH (ref 3.4–10.8)

## 2020-01-12 LAB — COMPREHENSIVE METABOLIC PANEL
ALT: 17 IU/L (ref 0–44)
AST: 26 IU/L (ref 0–40)
Albumin/Globulin Ratio: 0.7 — ABNORMAL LOW (ref 1.2–2.2)
Albumin: 3.1 g/dL — ABNORMAL LOW (ref 3.7–4.7)
Alkaline Phosphatase: 128 IU/L — ABNORMAL HIGH (ref 44–121)
BUN/Creatinine Ratio: 21 (ref 10–24)
BUN: 27 mg/dL (ref 8–27)
Bilirubin Total: 0.7 mg/dL (ref 0.0–1.2)
CO2: 22 mmol/L (ref 20–29)
Calcium: 8.5 mg/dL — ABNORMAL LOW (ref 8.6–10.2)
Chloride: 106 mmol/L (ref 96–106)
Creatinine, Ser: 1.27 mg/dL (ref 0.76–1.27)
GFR calc Af Amer: 63 mL/min/{1.73_m2} (ref >59)
GFR calc non Af Amer: 54 mL/min/{1.73_m2} — ABNORMAL LOW (ref >59)
Globulin, Total: 4.6 g/dL — ABNORMAL HIGH (ref 1.5–4.5)
Glucose: 109 mg/dL — ABNORMAL HIGH (ref 65–99)
Potassium: 5 mmol/L (ref 3.5–5.2)
Sodium: 136 mmol/L (ref 134–144)
Total Protein: 7.7 g/dL (ref 6.0–8.5)

## 2020-01-17 ENCOUNTER — Other Ambulatory Visit: Payer: Self-pay | Admitting: Family Medicine

## 2020-01-17 DIAGNOSIS — I2510 Atherosclerotic heart disease of native coronary artery without angina pectoris: Secondary | ICD-10-CM

## 2020-01-21 ENCOUNTER — Inpatient Hospital Stay: Payer: Medicare Other

## 2020-01-21 ENCOUNTER — Other Ambulatory Visit: Payer: Self-pay

## 2020-01-21 VITALS — BP 140/68 | HR 58 | Resp 18

## 2020-01-21 DIAGNOSIS — I132 Hypertensive heart and chronic kidney disease with heart failure and with stage 5 chronic kidney disease, or end stage renal disease: Secondary | ICD-10-CM | POA: Diagnosis not present

## 2020-01-21 DIAGNOSIS — D469 Myelodysplastic syndrome, unspecified: Secondary | ICD-10-CM

## 2020-01-21 DIAGNOSIS — E860 Dehydration: Secondary | ICD-10-CM | POA: Diagnosis not present

## 2020-01-21 DIAGNOSIS — R634 Abnormal weight loss: Secondary | ICD-10-CM | POA: Diagnosis not present

## 2020-01-21 DIAGNOSIS — E538 Deficiency of other specified B group vitamins: Secondary | ICD-10-CM | POA: Diagnosis not present

## 2020-01-21 DIAGNOSIS — I2510 Atherosclerotic heart disease of native coronary artery without angina pectoris: Secondary | ICD-10-CM | POA: Diagnosis not present

## 2020-01-21 DIAGNOSIS — D631 Anemia in chronic kidney disease: Secondary | ICD-10-CM | POA: Diagnosis not present

## 2020-01-21 DIAGNOSIS — M545 Low back pain, unspecified: Secondary | ICD-10-CM | POA: Diagnosis not present

## 2020-01-21 DIAGNOSIS — K5900 Constipation, unspecified: Secondary | ICD-10-CM | POA: Diagnosis not present

## 2020-01-21 DIAGNOSIS — I714 Abdominal aortic aneurysm, without rupture: Secondary | ICD-10-CM | POA: Diagnosis not present

## 2020-01-21 DIAGNOSIS — E119 Type 2 diabetes mellitus without complications: Secondary | ICD-10-CM | POA: Diagnosis not present

## 2020-01-21 DIAGNOSIS — D638 Anemia in other chronic diseases classified elsewhere: Secondary | ICD-10-CM

## 2020-01-21 DIAGNOSIS — E559 Vitamin D deficiency, unspecified: Secondary | ICD-10-CM | POA: Diagnosis not present

## 2020-01-21 DIAGNOSIS — E785 Hyperlipidemia, unspecified: Secondary | ICD-10-CM | POA: Diagnosis not present

## 2020-01-21 DIAGNOSIS — I10 Essential (primary) hypertension: Secondary | ICD-10-CM | POA: Diagnosis not present

## 2020-01-21 DIAGNOSIS — N183 Chronic kidney disease, stage 3 unspecified: Secondary | ICD-10-CM | POA: Diagnosis not present

## 2020-01-21 DIAGNOSIS — K7469 Other cirrhosis of liver: Secondary | ICD-10-CM | POA: Diagnosis not present

## 2020-01-21 DIAGNOSIS — K219 Gastro-esophageal reflux disease without esophagitis: Secondary | ICD-10-CM | POA: Diagnosis not present

## 2020-01-21 DIAGNOSIS — Z79899 Other long term (current) drug therapy: Secondary | ICD-10-CM | POA: Diagnosis not present

## 2020-01-21 DIAGNOSIS — Z7982 Long term (current) use of aspirin: Secondary | ICD-10-CM | POA: Diagnosis not present

## 2020-01-21 DIAGNOSIS — G629 Polyneuropathy, unspecified: Secondary | ICD-10-CM | POA: Diagnosis not present

## 2020-01-21 LAB — CBC WITH DIFFERENTIAL (CANCER CENTER ONLY)
Abs Immature Granulocytes: 0.32 10*3/uL — ABNORMAL HIGH (ref 0.00–0.07)
Basophils Absolute: 0.2 10*3/uL — ABNORMAL HIGH (ref 0.0–0.1)
Basophils Relative: 1 %
Eosinophils Absolute: 0.4 10*3/uL (ref 0.0–0.5)
Eosinophils Relative: 3 %
HCT: 31.8 % — ABNORMAL LOW (ref 39.0–52.0)
Hemoglobin: 10.6 g/dL — ABNORMAL LOW (ref 13.0–17.0)
Immature Granulocytes: 3 %
Lymphocytes Relative: 22 %
Lymphs Abs: 2.8 10*3/uL (ref 0.7–4.0)
MCH: 26.5 pg (ref 26.0–34.0)
MCHC: 33.3 g/dL (ref 30.0–36.0)
MCV: 79.5 fL — ABNORMAL LOW (ref 80.0–100.0)
Monocytes Absolute: 0.4 10*3/uL (ref 0.1–1.0)
Monocytes Relative: 3 %
Neutro Abs: 8.7 10*3/uL — ABNORMAL HIGH (ref 1.7–7.7)
Neutrophils Relative %: 68 %
Platelet Count: 225 10*3/uL (ref 150–400)
RBC: 4 MIL/uL — ABNORMAL LOW (ref 4.22–5.81)
RDW: 19.2 % — ABNORMAL HIGH (ref 11.5–15.5)
WBC Count: 12.8 10*3/uL — ABNORMAL HIGH (ref 4.0–10.5)
nRBC: 0 % (ref 0.0–0.2)

## 2020-01-21 MED ORDER — EPOETIN ALFA-EPBX 10000 UNIT/ML IJ SOLN
30000.0000 [IU] | INTRAMUSCULAR | Status: DC
  Administered 2020-01-21: 30000 [IU] via SUBCUTANEOUS

## 2020-01-21 MED ORDER — EPOETIN ALFA-EPBX 10000 UNIT/ML IJ SOLN
INTRAMUSCULAR | Status: AC
  Filled 2020-01-21: qty 3

## 2020-01-21 NOTE — Patient Instructions (Signed)

## 2020-01-24 ENCOUNTER — Other Ambulatory Visit: Payer: Self-pay | Admitting: Cardiovascular Disease

## 2020-01-24 DIAGNOSIS — E7849 Other hyperlipidemia: Secondary | ICD-10-CM

## 2020-01-29 NOTE — Progress Notes (Signed)
[place]   Telephone:(336) [phone] Fax:(336) [phone]   Clinic Follow up Note   Patient Care Team: Denita Lung, MD as PCP - General (Family Medicine) Burnell Blanks, MD as PCP - Cardiology (Cardiology) 01/30/2020  CHIEF COMPLAINT: Follow up MDS/MPN  CURRENT THERAPY:  ForMDS/MPN:Retacrit monthly started 10/30/18.Increase to every 2 weeks starting 01/29/19. Increased to weekly on 05/07/19.Decreased back to every 2 weeks from 07/04/19. Increased to 30K and q10 days 08/09/19. Increase back to q14 days in 01/30/20  INTERVAL HISTORY: Mr. Scheier returns for follow up as scheduled. He was last seen 12/20/19 and continues Retacrit q10 days. Wonders how long he will need injections. He is about the same lately. Appetite and energy remain low but stable over couple months since hospitalization for PNA. Denies feelings of depression.  His back pain improved on its own. Denies bleeding, fever, chills, night sweats, cough, chest pain, dyspnea, leg edema, n/v/c/d, change in bowel habits or new concerns.    MEDICAL HISTORY:  Past Medical History:  Diagnosis Date  . AAA (abdominal aortic aneurysm) ([place])   . Abnormal LFTs   . AKI (acute kidney injury) ([place])   . Anemia of chronic disease   . Arthritis    "all over" (12/04/2015)  . Atherosclerosis of native coronary artery of native heart without angina pectoris   . CAD (coronary artery disease)    with cath 09/26/08 showing severe heart disease in RCA with POBA, mild to mod diesease in circ and LAD  . Chronic back pain    back problems prior to surgery   . Constipation   . Coronary atherosclerosis of native coronary artery   . Dehydration   . Diabetes mellitus    with neuropathy  . Diarrhea   . Elevated liver enzymes   . Generalized weakness   . Gout of right hand   . Hiatal hernia   . History of gastroesophageal reflux (GERD)   . History of stomach ulcers   . Hyperlipidemia   . Hypertension   . Metabolic  acidosis   . Normochromic anemia 02/23/2016  . 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 Thrombocytopenia ([place])   . Unintentional weight loss 09/27/2017   33 lbs between 4/19 & 8/19  . Vitamin B12 deficiency   . Vitamin B12 deficiency   . Vitamin D deficiency   . Weakness     SURGICAL HISTORY: Past Surgical History:  Procedure Laterality Date  . ANKLE FRACTURE SURGERY Left    "crushed"  . BACK SURGERY  X 3   "fell 4 stories in 1977"  . CARPAL TUNNEL RELEASE Left   . CORONARY ANGIOPLASTY WITH STENT PLACEMENT  09/26/2008   [name] 06/03/2010  . FRACTURE SURGERY    . LEFT HEART CATHETERIZATION WITH CORONARY ANGIOGRAM N/A 01/21/2012   Procedure: LEFT HEART CATHETERIZATION WITH CORONARY ANGIOGRAM;  Surgeon: Burnell Blanks, MD;  Location: [facility] CATH LAB;  Service: Cardiovascular;  Laterality: N/A;  . LUMBAR [place]; ~ 1980  . POSTERIOR LUMBAR FUSION  ~ 1998   "3rd OR to reconstruct my back after fall in 1977"  . 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  ALLERGIES:  is allergic to allopurinol and metformin and related.  MEDICATIONS:  Current Outpatient Medications  Medication Sig Dispense Refill  . amLODipine (NORVASC) 5 MG tablet TAKE 1 TABLET BY MOUTH  DAILY 90 tablet 3  . aspirin 81 MG chewable tablet Chew 81 mg by mouth daily.    . clopidogrel (PLAVIX) 75 MG tablet TAKE 1 TABLET BY MOUTH  DAILY 90 tablet 3  . ENBREL SURECLICK 50 MG/ML injection Inject 50 mg into the skin once a week.     . febuxostat (ULORIC) 40 MG tablet Take 40 mg by mouth daily.    . fish oil-omega-3 fatty acids 1000 MG capsule Take 1 g by mouth 2 (two) times daily.     . furosemide (LASIX) 40 MG tablet Take 1 tablet (40 mg total) by mouth daily. 30 tablet 0  . glucose blood (ACCU-CHEK AVIVA PLUS) test strip Test once to twice daily 200 each 4  . isosorbide mononitrate (IMDUR) 60 MG 24 hr  tablet Take 1 tablet (60 mg total) by mouth at bedtime. 30 tablet 0  . leflunomide (ARAVA) 20 MG tablet Take 20 mg by mouth daily.    . metoprolol tartrate (LOPRESSOR) 25 MG tablet TAKE 1 TABLET BY MOUTH  TWICE DAILY 180 tablet 2  . Multiple Vitamins-Minerals (CENTRUM) tablet Take 1 tablet by mouth daily.    . nitroGLYCERIN (NITROSTAT) 0.4 MG SL tablet Place 0.4 mg under the tongue every 5 (five) minutes as needed for chest pain. For 3 doses    . pyridoxine (B-6) 100 MG tablet Take 100 mg by mouth daily.    . silver sulfADIAZINE (SILVADENE) 1 % cream Apply pea-sized amount to wound daily. 50 g 0  . simvastatin (ZOCOR) 20 MG tablet TAKE 1 TABLET BY MOUTH AT  BEDTIME 90 tablet 3   No current facility-administered medications for this visit.   Facility-Administered Medications Ordered in Other Visits  Medication Dose Route Frequency Provider Last Rate Last Admin  . epoetin alfa-epbx (RETACRIT) injection 30,000 Units  30,000 Units Subcutaneous See admin instructions Truitt Merle, MD   30,000 Units at 01/30/20 1258    PHYSICAL EXAMINATION: ECOG PERFORMANCE STATUS: 1 - Symptomatic but completely ambulatory  Vitals:   01/30/20 1132  BP: (!) 159/39  Pulse: 71  Temp: 97.8 F (36.6 C)  SpO2: 96%   Filed Weights   01/30/20 1132  Weight: 174 lb 6.4 oz (79.1 kg)    GENERAL:alert, no distress and comfortable SKIN: no rash  EYES:  sclera clear LUNGS: clear with normal breathing effort HEART: regular rate & rhythm, no lower extremity edema ABDOMEN:abdomen soft, non-tender and normal bowel sounds. No hepatomegaly or mass  NEURO: alert & oriented x 3 with fluent speech   LABORATORY DATA:  I have reviewed the data as listed CBC Latest Ref Rng & Units 01/30/2020 01/21/2020 01/11/2020  WBC 4.0 - 10.5 K/uL 16.6(H) 12.8(H) 14.1(H)  Hemoglobin 13.0 - 17.0 g/dL 10.3(L) 10.6(L) 10.4(L)  Hematocrit 39.0 - 52.0 % 31.0(L) 31.8(L) 31.5(L)  Platelets 150 - 400 K/uL 247 225 198     CMP Latest Ref Rng  & Units 01/11/2020 12/31/2019 12/20/2019  Glucose 65 - 99 mg/dL 109(H) 123(H) 122(H)  BUN 8 - 27 mg/dL 27 24(H) 49(H)  Creatinine 0.76 - 1.27 mg/dL 1.27 1.28(H) 1.76(H)  Sodium 134 - 144 mmol/L 136 136 137  Potassium 3.5 - 5.2 mmol/L 5.0 4.4 4.3  Chloride 96 - 106 mmol/L 106 111 107  CO2 20 - 29 mmol/L 22 20(L) 25  Calcium 8.6 - 10.2 mg/dL 8.5(L) 8.9 8.6(L)  Total Protein 6.0 - 8.5 g/dL 7.7 - -  Total Bilirubin 0.0 - 1.2 mg/dL 0.7 - -  Alkaline Phos 44 -  121 IU/L 128(H) - -  AST 0 - 40 IU/L 26 - -  ALT 0 - 44 IU/L 17 - -      RADIOGRAPHIC STUDIES: I have personally reviewed the radiological images as listed and agreed with the findings in the report. DG Foot Complete Left  Result Date: 01/29/2020 Please see detailed radiograph report in office note.    ASSESSMENT & PLAN: 79 yo male with   1. Weakness, weight loss, low BP -he was recently hospitalized 10/30 - 11/4 for CAP, recovered to baseline after discharge -he was evaluated 11/18 for acute weakness, fatigue, exertional dyspnea -BP sitting 118/47, standing 107/53, normal HR after taking BP meds and lasix. No dizziness -Bmet showed worsening renal function Scr up to 1.76.  -Symptoms felt to be secondary to dehydration from lasix, low po intake.  -CBC showsHg 11, likely r/t dehydrationas baseline is 9-10. Retacrit was held 12/20/19 -he was given 500 cc NS, lasix d/c'd, and encouraged to increase po liquids at home. He improved significantly after fluids -WBC 21, possibly reactive to recent infection? Significance unclear in the setting of chronic leukocytosis -unclear why he has anorexia and weight loss, return to PCP  2. MDS/MPN, JAK2 (-), cytogenetics normal -Hehas had anemia since mid 2017 andHghasin 9-10 range most of time, he also has mild leukocytosis,no thrombocytosis -Other contributions to his anemia include stage III chronic renal insufficiency and chronic gouty arthritis, heart disease, and pulmonary  fibrosis.Previous iron study and B12 level were normal. She was on oral iron, off sinceearly 2019due to constipation. -[name] did fairly exhaustive evaluation to exclude occult malignancyincluding CT CAPwhichwas negative -11/4/19Bone marrow is consistent with a myeloproliferative disorder with dysplastic features. -due to his worsening anemia in 10/2018 he started monthly retacrit whichhas been titrated periodically, now on 30K q10 days -Retacrit  Held 11/18/21for Hg 11 and resumed q10 days.  -Hg responding well, increased to q14 days 01/30/20  3. Pneumonia wtih hospitalization 12/01/19 - 12/06/19 -Imaging showed bilateral infiltrates, required short term supplemental oxygen -completed antibiotics -started on lasix at discharge ? Likely cause for dehydration, d/c'd -Recovered to baseline, seen by PCP 11/8. Scr slightly worse otherwise doing well -weight loss from 180-190's at baseline to 177 after discharge.   4. Chronic low back pain -Onset 6 months ago.  -managing with heat, tylenol PRN per Dr. Redmond School -[facility] MRI 12/04/19 showed chronic compression fractures at L2-L4 and disc bulging from L2-S1 with arthropathy, mild stenosis and degenerative changes -he declined referrals to Ortho or neurosurgery -much improved without intervention (01/30/20)  5. AAA, CAD with stent placed, h/o provoked RLE DVT, mild liver cirrhosis, gout, emphysema  -f/u Dr. Doren Custard vascular surgeon and PCP Dr. Redmond School for chronic conditions   Disposition:  Mr. Gadbois appears stable. He continues q10 day retacrit, anemia has responded well. Hg in 10 range for last month, up from 8-9 in 12/2019. Will increase to q14 days and monitor hemoglobin.   He continues to have fatigue and low appetite. He weighted 180-190's in 2021 until he was hospitalized for pneumonia 12/01/19 - 12/06/19. He was diuresed in the hospital, next weight on 12/10/19 of 177. He is stable from that.  F/up in 6 weeks.    All questions were answered. The patient knows to call the clinic with any problems, questions or concerns. No barriers to learning were detected.     Alla Feeling, NP 01/30/20

## 2020-01-30 ENCOUNTER — Other Ambulatory Visit: Payer: Self-pay

## 2020-01-30 ENCOUNTER — Inpatient Hospital Stay (HOSPITAL_BASED_OUTPATIENT_CLINIC_OR_DEPARTMENT_OTHER): Payer: Medicare Other | Admitting: Nurse Practitioner

## 2020-01-30 ENCOUNTER — Inpatient Hospital Stay: Payer: Medicare Other

## 2020-01-30 ENCOUNTER — Encounter: Payer: Self-pay | Admitting: Nurse Practitioner

## 2020-01-30 VITALS — BP 159/39 | HR 71 | Temp 97.8°F | Ht 70.0 in | Wt 174.4 lb

## 2020-01-30 DIAGNOSIS — E860 Dehydration: Secondary | ICD-10-CM | POA: Diagnosis not present

## 2020-01-30 DIAGNOSIS — E559 Vitamin D deficiency, unspecified: Secondary | ICD-10-CM | POA: Diagnosis not present

## 2020-01-30 DIAGNOSIS — N183 Chronic kidney disease, stage 3 unspecified: Secondary | ICD-10-CM | POA: Diagnosis not present

## 2020-01-30 DIAGNOSIS — R634 Abnormal weight loss: Secondary | ICD-10-CM | POA: Diagnosis not present

## 2020-01-30 DIAGNOSIS — D469 Myelodysplastic syndrome, unspecified: Secondary | ICD-10-CM | POA: Diagnosis not present

## 2020-01-30 DIAGNOSIS — I132 Hypertensive heart and chronic kidney disease with heart failure and with stage 5 chronic kidney disease, or end stage renal disease: Secondary | ICD-10-CM | POA: Diagnosis not present

## 2020-01-30 DIAGNOSIS — I10 Essential (primary) hypertension: Secondary | ICD-10-CM | POA: Diagnosis not present

## 2020-01-30 DIAGNOSIS — E538 Deficiency of other specified B group vitamins: Secondary | ICD-10-CM | POA: Diagnosis not present

## 2020-01-30 DIAGNOSIS — M545 Low back pain, unspecified: Secondary | ICD-10-CM | POA: Diagnosis not present

## 2020-01-30 DIAGNOSIS — E119 Type 2 diabetes mellitus without complications: Secondary | ICD-10-CM | POA: Diagnosis not present

## 2020-01-30 DIAGNOSIS — Z7982 Long term (current) use of aspirin: Secondary | ICD-10-CM | POA: Diagnosis not present

## 2020-01-30 DIAGNOSIS — C946 Myelodysplastic disease, not classified: Secondary | ICD-10-CM

## 2020-01-30 DIAGNOSIS — E785 Hyperlipidemia, unspecified: Secondary | ICD-10-CM | POA: Diagnosis not present

## 2020-01-30 DIAGNOSIS — I714 Abdominal aortic aneurysm, without rupture: Secondary | ICD-10-CM | POA: Diagnosis not present

## 2020-01-30 DIAGNOSIS — D638 Anemia in other chronic diseases classified elsewhere: Secondary | ICD-10-CM

## 2020-01-30 DIAGNOSIS — Z79899 Other long term (current) drug therapy: Secondary | ICD-10-CM | POA: Diagnosis not present

## 2020-01-30 DIAGNOSIS — G629 Polyneuropathy, unspecified: Secondary | ICD-10-CM | POA: Diagnosis not present

## 2020-01-30 DIAGNOSIS — I2510 Atherosclerotic heart disease of native coronary artery without angina pectoris: Secondary | ICD-10-CM | POA: Diagnosis not present

## 2020-01-30 DIAGNOSIS — K219 Gastro-esophageal reflux disease without esophagitis: Secondary | ICD-10-CM | POA: Diagnosis not present

## 2020-01-30 DIAGNOSIS — D631 Anemia in chronic kidney disease: Secondary | ICD-10-CM | POA: Diagnosis not present

## 2020-01-30 DIAGNOSIS — K5900 Constipation, unspecified: Secondary | ICD-10-CM | POA: Diagnosis not present

## 2020-01-30 DIAGNOSIS — K7469 Other cirrhosis of liver: Secondary | ICD-10-CM | POA: Diagnosis not present

## 2020-01-30 LAB — CBC WITH DIFFERENTIAL (CANCER CENTER ONLY)
Abs Immature Granulocytes: 0.67 10*3/uL — ABNORMAL HIGH (ref 0.00–0.07)
Basophils Absolute: 0.2 10*3/uL — ABNORMAL HIGH (ref 0.0–0.1)
Basophils Relative: 1 %
Eosinophils Absolute: 0.4 10*3/uL (ref 0.0–0.5)
Eosinophils Relative: 3 %
HCT: 31 % — ABNORMAL LOW (ref 39.0–52.0)
Hemoglobin: 10.3 g/dL — ABNORMAL LOW (ref 13.0–17.0)
Immature Granulocytes: 4 %
Lymphocytes Relative: 25 %
Lymphs Abs: 4.1 10*3/uL — ABNORMAL HIGH (ref 0.7–4.0)
MCH: 26.4 pg (ref 26.0–34.0)
MCHC: 33.2 g/dL (ref 30.0–36.0)
MCV: 79.5 fL — ABNORMAL LOW (ref 80.0–100.0)
Monocytes Absolute: 0.6 10*3/uL (ref 0.1–1.0)
Monocytes Relative: 3 %
Neutro Abs: 10.7 10*3/uL — ABNORMAL HIGH (ref 1.7–7.7)
Neutrophils Relative %: 64 %
Platelet Count: 247 10*3/uL (ref 150–400)
RBC: 3.9 MIL/uL — ABNORMAL LOW (ref 4.22–5.81)
RDW: 19.2 % — ABNORMAL HIGH (ref 11.5–15.5)
WBC Count: 16.6 10*3/uL — ABNORMAL HIGH (ref 4.0–10.5)
nRBC: 0 % (ref 0.0–0.2)

## 2020-01-30 MED ORDER — EPOETIN ALFA-EPBX 10000 UNIT/ML IJ SOLN
INTRAMUSCULAR | Status: AC
  Filled 2020-01-30: qty 3

## 2020-01-30 MED ORDER — EPOETIN ALFA-EPBX 10000 UNIT/ML IJ SOLN
30000.0000 [IU] | INTRAMUSCULAR | Status: DC
  Administered 2020-01-30: 30000 [IU] via SUBCUTANEOUS

## 2020-01-30 NOTE — Patient Instructions (Signed)

## 2020-01-31 ENCOUNTER — Ambulatory Visit: Payer: Medicare Other | Admitting: Hematology

## 2020-01-31 ENCOUNTER — Ambulatory Visit: Payer: Medicare Other

## 2020-01-31 ENCOUNTER — Telehealth: Payer: Self-pay | Admitting: Nurse Practitioner

## 2020-01-31 ENCOUNTER — Other Ambulatory Visit: Payer: Medicare Other

## 2020-01-31 NOTE — Telephone Encounter (Signed)
Scheduled appointments per 12/29 los. Spoke to patient who is aware of appointments dates and times.  

## 2020-02-08 ENCOUNTER — Ambulatory Visit: Payer: Medicare Other | Admitting: Podiatry

## 2020-02-13 ENCOUNTER — Inpatient Hospital Stay: Payer: Medicare Other | Attending: Hematology

## 2020-02-13 ENCOUNTER — Inpatient Hospital Stay: Payer: Medicare Other

## 2020-02-13 ENCOUNTER — Other Ambulatory Visit: Payer: Self-pay

## 2020-02-13 VITALS — BP 169/57 | HR 52 | Resp 18

## 2020-02-13 DIAGNOSIS — D469 Myelodysplastic syndrome, unspecified: Secondary | ICD-10-CM | POA: Insufficient documentation

## 2020-02-13 DIAGNOSIS — Z79899 Other long term (current) drug therapy: Secondary | ICD-10-CM | POA: Diagnosis not present

## 2020-02-13 DIAGNOSIS — C946 Myelodysplastic disease, not classified: Secondary | ICD-10-CM

## 2020-02-13 DIAGNOSIS — D638 Anemia in other chronic diseases classified elsewhere: Secondary | ICD-10-CM

## 2020-02-13 LAB — CBC WITH DIFFERENTIAL (CANCER CENTER ONLY)
Abs Immature Granulocytes: 0.44 10*3/uL — ABNORMAL HIGH (ref 0.00–0.07)
Basophils Absolute: 0.2 10*3/uL — ABNORMAL HIGH (ref 0.0–0.1)
Basophils Relative: 2 %
Eosinophils Absolute: 0.6 10*3/uL — ABNORMAL HIGH (ref 0.0–0.5)
Eosinophils Relative: 4 %
HCT: 31 % — ABNORMAL LOW (ref 39.0–52.0)
Hemoglobin: 10.3 g/dL — ABNORMAL LOW (ref 13.0–17.0)
Immature Granulocytes: 3 %
Lymphocytes Relative: 21 %
Lymphs Abs: 3.2 10*3/uL (ref 0.7–4.0)
MCH: 26.3 pg (ref 26.0–34.0)
MCHC: 33.2 g/dL (ref 30.0–36.0)
MCV: 79.3 fL — ABNORMAL LOW (ref 80.0–100.0)
Monocytes Absolute: 0.4 10*3/uL (ref 0.1–1.0)
Monocytes Relative: 3 %
Neutro Abs: 10.5 10*3/uL — ABNORMAL HIGH (ref 1.7–7.7)
Neutrophils Relative %: 67 %
Platelet Count: 171 10*3/uL (ref 150–400)
RBC: 3.91 MIL/uL — ABNORMAL LOW (ref 4.22–5.81)
RDW: 18.5 % — ABNORMAL HIGH (ref 11.5–15.5)
WBC Count: 15.3 10*3/uL — ABNORMAL HIGH (ref 4.0–10.5)
nRBC: 0 % (ref 0.0–0.2)

## 2020-02-13 MED ORDER — EPOETIN ALFA-EPBX 10000 UNIT/ML IJ SOLN
30000.0000 [IU] | INTRAMUSCULAR | Status: DC
  Administered 2020-02-13: 30000 [IU] via SUBCUTANEOUS

## 2020-02-13 MED ORDER — EPOETIN ALFA-EPBX 10000 UNIT/ML IJ SOLN
INTRAMUSCULAR | Status: AC
  Filled 2020-02-13: qty 3

## 2020-02-13 NOTE — Patient Instructions (Signed)

## 2020-02-27 ENCOUNTER — Other Ambulatory Visit: Payer: Self-pay

## 2020-02-27 ENCOUNTER — Inpatient Hospital Stay: Payer: Medicare Other

## 2020-02-27 VITALS — BP 155/62

## 2020-02-27 DIAGNOSIS — Z79899 Other long term (current) drug therapy: Secondary | ICD-10-CM | POA: Diagnosis not present

## 2020-02-27 DIAGNOSIS — D638 Anemia in other chronic diseases classified elsewhere: Secondary | ICD-10-CM

## 2020-02-27 DIAGNOSIS — D469 Myelodysplastic syndrome, unspecified: Secondary | ICD-10-CM

## 2020-02-27 LAB — CBC WITH DIFFERENTIAL (CANCER CENTER ONLY)
Abs Immature Granulocytes: 0.36 10*3/uL — ABNORMAL HIGH (ref 0.00–0.07)
Basophils Absolute: 0.2 10*3/uL — ABNORMAL HIGH (ref 0.0–0.1)
Basophils Relative: 1 %
Eosinophils Absolute: 0.4 10*3/uL (ref 0.0–0.5)
Eosinophils Relative: 3 %
HCT: 32.1 % — ABNORMAL LOW (ref 39.0–52.0)
Hemoglobin: 10.6 g/dL — ABNORMAL LOW (ref 13.0–17.0)
Immature Granulocytes: 3 %
Lymphocytes Relative: 24 %
Lymphs Abs: 3.4 10*3/uL (ref 0.7–4.0)
MCH: 26.7 pg (ref 26.0–34.0)
MCHC: 33 g/dL (ref 30.0–36.0)
MCV: 80.9 fL (ref 80.0–100.0)
Monocytes Absolute: 0.4 10*3/uL (ref 0.1–1.0)
Monocytes Relative: 3 %
Neutro Abs: 9.7 10*3/uL — ABNORMAL HIGH (ref 1.7–7.7)
Neutrophils Relative %: 66 %
Platelet Count: 192 10*3/uL (ref 150–400)
RBC: 3.97 MIL/uL — ABNORMAL LOW (ref 4.22–5.81)
RDW: 19.3 % — ABNORMAL HIGH (ref 11.5–15.5)
WBC Count: 14.5 10*3/uL — ABNORMAL HIGH (ref 4.0–10.5)
nRBC: 0 % (ref 0.0–0.2)

## 2020-02-27 MED ORDER — EPOETIN ALFA-EPBX 10000 UNIT/ML IJ SOLN
INTRAMUSCULAR | Status: AC
  Filled 2020-02-27: qty 3

## 2020-02-27 MED ORDER — EPOETIN ALFA-EPBX 10000 UNIT/ML IJ SOLN
30000.0000 [IU] | INTRAMUSCULAR | Status: DC
  Administered 2020-02-27: 30000 [IU] via SUBCUTANEOUS

## 2020-02-27 NOTE — Patient Instructions (Signed)

## 2020-03-10 DIAGNOSIS — M109 Gout, unspecified: Secondary | ICD-10-CM | POA: Diagnosis not present

## 2020-03-10 DIAGNOSIS — Z6824 Body mass index (BMI) 24.0-24.9, adult: Secondary | ICD-10-CM | POA: Diagnosis not present

## 2020-03-10 DIAGNOSIS — I82A11 Acute embolism and thrombosis of right axillary vein: Secondary | ICD-10-CM | POA: Diagnosis not present

## 2020-03-10 DIAGNOSIS — Z7189 Other specified counseling: Secondary | ICD-10-CM | POA: Diagnosis not present

## 2020-03-10 DIAGNOSIS — M0579 Rheumatoid arthritis with rheumatoid factor of multiple sites without organ or systems involvement: Secondary | ICD-10-CM | POA: Diagnosis not present

## 2020-03-10 DIAGNOSIS — N1831 Chronic kidney disease, stage 3a: Secondary | ICD-10-CM | POA: Diagnosis not present

## 2020-03-10 DIAGNOSIS — M25562 Pain in left knee: Secondary | ICD-10-CM | POA: Diagnosis not present

## 2020-03-10 DIAGNOSIS — D508 Other iron deficiency anemias: Secondary | ICD-10-CM | POA: Diagnosis not present

## 2020-03-10 DIAGNOSIS — R634 Abnormal weight loss: Secondary | ICD-10-CM | POA: Diagnosis not present

## 2020-03-10 NOTE — Progress Notes (Signed)
Paul [name]   Telephone:(336) [phone] Fax:(336) [phone]   Clinic Follow up Note   Patient Care Team: Denita Lung, MD as PCP - General (Family Medicine) Burnell Blanks, MD as PCP - Cardiology (Cardiology)  Date of Service:  03/13/2020   CHIEF COMPLAINT:F/u ofMDS/MPN, with mildleukocytosis and moderate anemia,JAK2 V617F (-)  CURRENT THERAPY: Retacrit monthly started 10/30/18. Will increase to every 2 weeks starting 01/29/19. Increased to weekly on 05/07/19.Decreased back to every 2 weeks from 07/04/19. Increased to every 10 days on 08/09/19. Changed back to q14 days in 01/30/20.   INTERVAL HISTORY:  Paul [name] is here for a follow up of MDS/MPN. He presents to the clinic alone. He is stable overall, still has mild dyspnea on exertion, no chest pain, able to walk half block without stopping He is off lasix now, no leg edema  He lives with his wife, who is 35 yo and works. He functions well at home.   All other systems were reviewed with the patient and are negative.  MEDICAL HISTORY:  Past Medical History:  Diagnosis Date  . AAA (abdominal aortic aneurysm) ([place])   . Abnormal LFTs   . AKI (acute kidney injury) ([place])   . Anemia of chronic disease   . Arthritis    "all over" (12/04/2015)  . Atherosclerosis of native coronary artery of native heart without angina pectoris   . CAD (coronary artery disease)    with cath 09/26/08 showing severe heart disease in RCA with POBA, mild to mod diesease in circ and LAD  . Chronic back pain    back problems prior to surgery   . Constipation   . Coronary atherosclerosis of native coronary artery   . Dehydration   . Diabetes mellitus    with neuropathy  . Diarrhea   . Elevated liver enzymes   . Generalized weakness   . Gout of right hand   . Hiatal hernia   . History of gastroesophageal reflux (GERD)   . History of stomach ulcers   . Hyperlipidemia   . Hypertension   . Metabolic acidosis   .  Normochromic anemia 02/23/2016  . 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 Thrombocytopenia ([place])   . Unintentional weight loss 09/27/2017   33 lbs between 4/19 & 8/19  . Vitamin B12 deficiency   . Vitamin B12 deficiency   . Vitamin D deficiency   . Weakness     SURGICAL HISTORY: Past Surgical History:  Procedure Laterality Date  . ANKLE FRACTURE SURGERY Left    "crushed"  . BACK SURGERY  X 3   "fell 4 stories in 1977"  . CARPAL TUNNEL RELEASE Left   . CORONARY ANGIOPLASTY WITH STENT PLACEMENT  09/26/2008   [name] 06/03/2010  . FRACTURE SURGERY    . LEFT HEART CATHETERIZATION WITH CORONARY ANGIOGRAM N/A 01/21/2012   Procedure: LEFT HEART CATHETERIZATION WITH CORONARY ANGIOGRAM;  Surgeon: Burnell Blanks, MD;  Location: [facility] CATH LAB;  Service: Cardiovascular;  Laterality: N/A;  . LUMBAR [place]; ~ 1980  . POSTERIOR LUMBAR FUSION  ~ 1998   "3rd OR to reconstruct my back after fall in 1977"  . 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  ALLERGIES:  is allergic to allopurinol and metformin and related.  MEDICATIONS:  Current Outpatient Medications  Medication Sig Dispense Refill  . amLODipine (  NORVASC) 5 MG tablet TAKE 1 TABLET BY MOUTH  DAILY 90 tablet 3  . aspirin 81 MG chewable tablet Chew 81 mg by mouth daily.    . clopidogrel (PLAVIX) 75 MG tablet TAKE 1 TABLET BY MOUTH  DAILY 90 tablet 3  . ENBREL SURECLICK 50 MG/ML injection Inject 50 mg into the skin once a week.     . febuxostat (ULORIC) 40 MG tablet Take 40 mg by mouth daily.    . fish oil-omega-3 fatty acids 1000 MG capsule Take 1 g by mouth 2 (two) times daily.     Marland Kitchen glucose blood (ACCU-CHEK AVIVA PLUS) test strip Test once to twice daily 200 each 4  . isosorbide mononitrate (IMDUR) 60 MG 24 hr tablet Take 1 tablet (60 mg total) by mouth at bedtime. 30 tablet 0  . leflunomide (ARAVA) 20 MG tablet  Take 20 mg by mouth daily.    . metoprolol tartrate (LOPRESSOR) 25 MG tablet TAKE 1 TABLET BY MOUTH  TWICE DAILY 180 tablet 2  . Multiple Vitamins-Minerals (CENTRUM) tablet Take 1 tablet by mouth daily.    . nitroGLYCERIN (NITROSTAT) 0.4 MG SL tablet Place 0.4 mg under the tongue every 5 (five) minutes as needed for chest pain. For 3 doses    . pyridoxine (B-6) 100 MG tablet Take 100 mg by mouth daily.    . silver sulfADIAZINE (SILVADENE) 1 % cream Apply pea-sized amount to wound daily. 50 g 0  . simvastatin (ZOCOR) 20 MG tablet TAKE 1 TABLET BY MOUTH AT  BEDTIME 90 tablet 3   No current facility-administered medications for this visit.    PHYSICAL EXAMINATION: ECOG PERFORMANCE STATUS:2 Weight 169 pounds, blood pressure 169/48, temperature 97.9, respiratory rate 17, heart rate 58, pulse ox 97% on room air GENERAL:alert, no distress and comfortable SKIN: skin color, texture, turgor are normal, no rashes or significant lesions EYES: normal, Conjunctiva are pink and non-injected, sclera clear NECK: supple, thyroid normal size, non-tender, without nodularity LYMPH:  no palpable lymphadenopathy in the cervical, axillary  LUNGS: clear to auscultation except rales on b/l lung base, no wheezing  HEART: regular rate & rhythm and no murmurs and no lower extremity edema ABDOMEN:abdomen soft, non-tender and normal bowel sounds Musculoskeletal:no cyanosis of digits and no clubbing  NEURO: alert & oriented x 3 with fluent speech, no focal motor/sensory deficits  LABORATORY DATA:  I have reviewed the data as listed CBC Latest Ref Rng & Units 03/13/2020 02/27/2020 02/13/2020  WBC 4.0 - 10.5 K/uL 15.8(H) 14.5(H) 15.3(H)  Hemoglobin 13.0 - 17.0 g/dL 10.5(L) 10.6(L) 10.3(L)  Hematocrit 39.0 - 52.0 % 31.9(L) 32.1(L) 31.0(L)  Platelets 150 - 400 K/uL 200 192 171     CMP Latest Ref Rng & Units 01/11/2020 12/31/2019 12/20/2019  Glucose 65 - 99 mg/dL 109(H) 123(H) 122(H)  BUN 8 - 27 mg/dL 27 24(H) 49(H)   Creatinine 0.76 - 1.27 mg/dL 1.27 1.28(H) 1.76(H)  Sodium 134 - 144 mmol/L 136 136 137  Potassium 3.5 - 5.2 mmol/L 5.0 4.4 4.3  Chloride 96 - 106 mmol/L 106 111 107  CO2 20 - 29 mmol/L 22 20(L) 25  Calcium 8.6 - 10.2 mg/dL 8.5(L) 8.9 8.6(L)  Total Protein 6.0 - 8.5 g/dL 7.7 - -  Total Bilirubin 0.0 - 1.2 mg/dL 0.7 - -  Alkaline Phos 44 - 121 IU/L 128(H) - -  AST 0 - 40 IU/L 26 - -  ALT 0 - 44 IU/L 17 - -      RADIOGRAPHIC STUDIES:  I have personally reviewed the radiological images as listed and agreed with the findings in the report. No results found.   ASSESSMENT & PLAN:  Paul [name] is a 80 y.o. male with    1. MDS/MPN, JAK2 (-), cytogenetics normal -Hehas had anemia issues since mid 2017 andHghasin 9-10 range most of time, he also has mild leukocytosis, thrombocytosisresolved now -Other contributions to his anemia include stage III chronic renal insufficiency and chronic gouty arthritis.Previous iron study and B12 level were normal. 11/4/19Bone marrow is consistent with a myeloproliferative disorder with dysplastic features. -His leukocytosis is very mild, no thrombocytosis, risk of thrombosis is not very high, no need treatment such as hydrea.He is currently on aspirin and Plavix, which also helps to prevent thrombosis. -His anemia has worsened so I started him on treatment with monthly Retacrit beginning 10/30/18. He is currently on every 2 weeks since 01/30/20.  -he is clinically stable, does have mild to moderate dyspnea on exertion, able to function at home. -Lab reviewed, hemoglobin 10.5, it has been around 10.3-10.6, very stable -Continue lab and injection every 2 weeks    2.Gout, RA, back pain -Had allergic reaction to Allopurinol  -Now controlled withcolchicine and Uloric -For his RA heis onEnbrel. -For recentacute bilateral back pain,a lumbar xray was obtained on 07/04/19 and showed stable chronic compression fractures of L2-L3 along with  degenerative disc disease.  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 He currently smokes 1/2ppd. -CT chest from 11/2017 shows mild emphysema and small rightpleural effusion,pulmonary parenchymal fibrosis, probable UIP. -He wasstrongly encouraged to stop smoking to prevent further exacerbation and may need oxygen. -Previously recommended pulmonologyreferralbut patientdeclined -pulmonary symptoms and exam are stable overall  4. AAA, CAD with stent placed -followed by Dr. Doren Custard, vascular surgeon. -On Plavixand ASAwhich also helps reduce risk of thrombosis  5.Mild liver cirrhosis -h/o moderate alcohol;CT AP from 10/2017 which showed nodularity suspicious of cirrhosis. -  6. H/o ofright LEDVT, provoked -Treated with 3 months of Xarelto, off now   PLAN: -lab reviewed -Proceed with Retacrit today at same dose 30K and continue every 14 days if Hg<11  -f/u in 3 months    No problem-specific Assessment & Plan notes found for this encounter.   No orders of the defined types were placed in this encounter.  All questions were answered. The patient knows to call the clinic with any problems, questions or concerns. No barriers to learning was detected. The total time spent in the appointment was 25 minutes.     Truitt Merle, MD 03/13/2020   I, Joslyn Devon, am acting as scribe for Truitt Merle, MD.   I have reviewed the above documentation for accuracy and completeness, and I agree with the above.

## 2020-03-13 ENCOUNTER — Inpatient Hospital Stay: Payer: Medicare Other

## 2020-03-13 ENCOUNTER — Other Ambulatory Visit: Payer: Self-pay

## 2020-03-13 ENCOUNTER — Inpatient Hospital Stay (HOSPITAL_BASED_OUTPATIENT_CLINIC_OR_DEPARTMENT_OTHER): Payer: Medicare Other | Admitting: Hematology

## 2020-03-13 ENCOUNTER — Encounter: Payer: Self-pay | Admitting: Hematology

## 2020-03-13 ENCOUNTER — Inpatient Hospital Stay: Payer: Medicare Other | Attending: Hematology

## 2020-03-13 VITALS — BP 169/48 | HR 58 | Temp 97.9°F | Resp 17 | Wt 168.6 lb

## 2020-03-13 DIAGNOSIS — I129 Hypertensive chronic kidney disease with stage 1 through stage 4 chronic kidney disease, or unspecified chronic kidney disease: Secondary | ICD-10-CM | POA: Insufficient documentation

## 2020-03-13 DIAGNOSIS — D469 Myelodysplastic syndrome, unspecified: Secondary | ICD-10-CM | POA: Diagnosis not present

## 2020-03-13 DIAGNOSIS — K7460 Unspecified cirrhosis of liver: Secondary | ICD-10-CM | POA: Insufficient documentation

## 2020-03-13 DIAGNOSIS — Z79899 Other long term (current) drug therapy: Secondary | ICD-10-CM | POA: Insufficient documentation

## 2020-03-13 DIAGNOSIS — Z86718 Personal history of other venous thrombosis and embolism: Secondary | ICD-10-CM | POA: Insufficient documentation

## 2020-03-13 DIAGNOSIS — N183 Chronic kidney disease, stage 3 unspecified: Secondary | ICD-10-CM | POA: Diagnosis not present

## 2020-03-13 DIAGNOSIS — M545 Low back pain, unspecified: Secondary | ICD-10-CM | POA: Insufficient documentation

## 2020-03-13 DIAGNOSIS — J439 Emphysema, unspecified: Secondary | ICD-10-CM | POA: Diagnosis not present

## 2020-03-13 DIAGNOSIS — I2510 Atherosclerotic heart disease of native coronary artery without angina pectoris: Secondary | ICD-10-CM | POA: Diagnosis not present

## 2020-03-13 DIAGNOSIS — D631 Anemia in chronic kidney disease: Secondary | ICD-10-CM | POA: Diagnosis not present

## 2020-03-13 DIAGNOSIS — M109 Gout, unspecified: Secondary | ICD-10-CM | POA: Diagnosis not present

## 2020-03-13 DIAGNOSIS — D638 Anemia in other chronic diseases classified elsewhere: Secondary | ICD-10-CM

## 2020-03-13 DIAGNOSIS — Z7901 Long term (current) use of anticoagulants: Secondary | ICD-10-CM | POA: Insufficient documentation

## 2020-03-13 DIAGNOSIS — I714 Abdominal aortic aneurysm, without rupture: Secondary | ICD-10-CM | POA: Insufficient documentation

## 2020-03-13 DIAGNOSIS — M069 Rheumatoid arthritis, unspecified: Secondary | ICD-10-CM | POA: Insufficient documentation

## 2020-03-13 LAB — CBC WITH DIFFERENTIAL (CANCER CENTER ONLY)
Abs Immature Granulocytes: 0.53 10*3/uL — ABNORMAL HIGH (ref 0.00–0.07)
Basophils Absolute: 0.2 10*3/uL — ABNORMAL HIGH (ref 0.0–0.1)
Basophils Relative: 1 %
Eosinophils Absolute: 0.4 10*3/uL (ref 0.0–0.5)
Eosinophils Relative: 3 %
HCT: 31.9 % — ABNORMAL LOW (ref 39.0–52.0)
Hemoglobin: 10.5 g/dL — ABNORMAL LOW (ref 13.0–17.0)
Immature Granulocytes: 3 %
Lymphocytes Relative: 22 %
Lymphs Abs: 3.4 10*3/uL (ref 0.7–4.0)
MCH: 26.6 pg (ref 26.0–34.0)
MCHC: 32.9 g/dL (ref 30.0–36.0)
MCV: 80.8 fL (ref 80.0–100.0)
Monocytes Absolute: 0.4 10*3/uL (ref 0.1–1.0)
Monocytes Relative: 2 %
Neutro Abs: 10.9 10*3/uL — ABNORMAL HIGH (ref 1.7–7.7)
Neutrophils Relative %: 69 %
Platelet Count: 200 10*3/uL (ref 150–400)
RBC: 3.95 MIL/uL — ABNORMAL LOW (ref 4.22–5.81)
RDW: 19.5 % — ABNORMAL HIGH (ref 11.5–15.5)
WBC Count: 15.8 10*3/uL — ABNORMAL HIGH (ref 4.0–10.5)
nRBC: 0 % (ref 0.0–0.2)

## 2020-03-13 MED ORDER — EPOETIN ALFA-EPBX 40000 UNIT/ML IJ SOLN
INTRAMUSCULAR | Status: AC
  Filled 2020-03-13: qty 1

## 2020-03-13 MED ORDER — EPOETIN ALFA-EPBX 40000 UNIT/ML IJ SOLN
30000.0000 [IU] | INTRAMUSCULAR | Status: DC
  Administered 2020-03-13: 30000 [IU] via SUBCUTANEOUS

## 2020-03-13 NOTE — Patient Instructions (Signed)

## 2020-03-13 NOTE — Progress Notes (Signed)
Changed to retacrit 40000unit vial since RN had drawn up dose and no pts scheduled for retacrit today

## 2020-03-14 ENCOUNTER — Telehealth: Payer: Self-pay | Admitting: Hematology

## 2020-03-14 NOTE — Telephone Encounter (Signed)
Scheduled follow-up appointments per 2/10 los. Patient is aware. 

## 2020-03-27 ENCOUNTER — Inpatient Hospital Stay: Payer: Medicare Other

## 2020-03-27 ENCOUNTER — Other Ambulatory Visit: Payer: Self-pay

## 2020-03-27 VITALS — BP 143/46 | HR 52

## 2020-03-27 DIAGNOSIS — M069 Rheumatoid arthritis, unspecified: Secondary | ICD-10-CM | POA: Diagnosis not present

## 2020-03-27 DIAGNOSIS — D469 Myelodysplastic syndrome, unspecified: Secondary | ICD-10-CM | POA: Diagnosis not present

## 2020-03-27 DIAGNOSIS — K7460 Unspecified cirrhosis of liver: Secondary | ICD-10-CM | POA: Diagnosis not present

## 2020-03-27 DIAGNOSIS — I2510 Atherosclerotic heart disease of native coronary artery without angina pectoris: Secondary | ICD-10-CM | POA: Diagnosis not present

## 2020-03-27 DIAGNOSIS — D638 Anemia in other chronic diseases classified elsewhere: Secondary | ICD-10-CM

## 2020-03-27 DIAGNOSIS — D631 Anemia in chronic kidney disease: Secondary | ICD-10-CM | POA: Diagnosis not present

## 2020-03-27 DIAGNOSIS — Z86718 Personal history of other venous thrombosis and embolism: Secondary | ICD-10-CM | POA: Diagnosis not present

## 2020-03-27 DIAGNOSIS — M545 Low back pain, unspecified: Secondary | ICD-10-CM | POA: Diagnosis not present

## 2020-03-27 DIAGNOSIS — N183 Chronic kidney disease, stage 3 unspecified: Secondary | ICD-10-CM | POA: Diagnosis not present

## 2020-03-27 DIAGNOSIS — Z7901 Long term (current) use of anticoagulants: Secondary | ICD-10-CM | POA: Diagnosis not present

## 2020-03-27 DIAGNOSIS — I714 Abdominal aortic aneurysm, without rupture: Secondary | ICD-10-CM | POA: Diagnosis not present

## 2020-03-27 DIAGNOSIS — I129 Hypertensive chronic kidney disease with stage 1 through stage 4 chronic kidney disease, or unspecified chronic kidney disease: Secondary | ICD-10-CM | POA: Diagnosis not present

## 2020-03-27 DIAGNOSIS — M109 Gout, unspecified: Secondary | ICD-10-CM | POA: Diagnosis not present

## 2020-03-27 DIAGNOSIS — J439 Emphysema, unspecified: Secondary | ICD-10-CM | POA: Diagnosis not present

## 2020-03-27 DIAGNOSIS — Z79899 Other long term (current) drug therapy: Secondary | ICD-10-CM | POA: Diagnosis not present

## 2020-03-27 LAB — CBC WITH DIFFERENTIAL (CANCER CENTER ONLY)
Abs Immature Granulocytes: 0.68 10*3/uL — ABNORMAL HIGH (ref 0.00–0.07)
Basophils Absolute: 0.2 10*3/uL — ABNORMAL HIGH (ref 0.0–0.1)
Basophils Relative: 1 %
Eosinophils Absolute: 0.4 10*3/uL (ref 0.0–0.5)
Eosinophils Relative: 3 %
HCT: 33.2 % — ABNORMAL LOW (ref 39.0–52.0)
Hemoglobin: 10.9 g/dL — ABNORMAL LOW (ref 13.0–17.0)
Immature Granulocytes: 4 %
Lymphocytes Relative: 21 %
Lymphs Abs: 3.3 10*3/uL (ref 0.7–4.0)
MCH: 26.3 pg (ref 26.0–34.0)
MCHC: 32.8 g/dL (ref 30.0–36.0)
MCV: 80.2 fL (ref 80.0–100.0)
Monocytes Absolute: 0.4 10*3/uL (ref 0.1–1.0)
Monocytes Relative: 2 %
Neutro Abs: 11.1 10*3/uL — ABNORMAL HIGH (ref 1.7–7.7)
Neutrophils Relative %: 69 %
Platelet Count: 223 10*3/uL (ref 150–400)
RBC: 4.14 MIL/uL — ABNORMAL LOW (ref 4.22–5.81)
RDW: 19.4 % — ABNORMAL HIGH (ref 11.5–15.5)
WBC Count: 16 10*3/uL — ABNORMAL HIGH (ref 4.0–10.5)
nRBC: 0 % (ref 0.0–0.2)

## 2020-03-27 LAB — IRON AND TIBC
Iron: 66 ug/dL (ref 42–163)
Saturation Ratios: 34 % (ref 20–55)
TIBC: 194 ug/dL — ABNORMAL LOW (ref 202–409)
UIBC: 128 ug/dL (ref 117–376)

## 2020-03-27 LAB — FERRITIN: Ferritin: 155 ng/mL (ref 24–336)

## 2020-03-27 MED ORDER — EPOETIN ALFA-EPBX 10000 UNIT/ML IJ SOLN
INTRAMUSCULAR | Status: AC
  Filled 2020-03-27: qty 3

## 2020-03-27 MED ORDER — EPOETIN ALFA-EPBX 10000 UNIT/ML IJ SOLN
30000.0000 [IU] | INTRAMUSCULAR | Status: DC
  Administered 2020-03-27: 30000 [IU] via SUBCUTANEOUS

## 2020-04-10 ENCOUNTER — Inpatient Hospital Stay: Payer: Medicare Other | Attending: Hematology

## 2020-04-10 ENCOUNTER — Other Ambulatory Visit: Payer: Self-pay

## 2020-04-10 ENCOUNTER — Inpatient Hospital Stay: Payer: Medicare Other

## 2020-04-10 VITALS — BP 143/46 | HR 53 | Temp 97.9°F

## 2020-04-10 DIAGNOSIS — D469 Myelodysplastic syndrome, unspecified: Secondary | ICD-10-CM

## 2020-04-10 DIAGNOSIS — D638 Anemia in other chronic diseases classified elsewhere: Secondary | ICD-10-CM

## 2020-04-10 DIAGNOSIS — Z79899 Other long term (current) drug therapy: Secondary | ICD-10-CM | POA: Diagnosis not present

## 2020-04-10 LAB — CBC WITH DIFFERENTIAL (CANCER CENTER ONLY)
Abs Immature Granulocytes: 0.49 10*3/uL — ABNORMAL HIGH (ref 0.00–0.07)
Basophils Absolute: 0.2 10*3/uL — ABNORMAL HIGH (ref 0.0–0.1)
Basophils Relative: 1 %
Eosinophils Absolute: 0.4 10*3/uL (ref 0.0–0.5)
Eosinophils Relative: 3 %
HCT: 30.5 % — ABNORMAL LOW (ref 39.0–52.0)
Hemoglobin: 10.1 g/dL — ABNORMAL LOW (ref 13.0–17.0)
Immature Granulocytes: 3 %
Lymphocytes Relative: 22 %
Lymphs Abs: 3.2 10*3/uL (ref 0.7–4.0)
MCH: 26.3 pg (ref 26.0–34.0)
MCHC: 33.1 g/dL (ref 30.0–36.0)
MCV: 79.4 fL — ABNORMAL LOW (ref 80.0–100.0)
Monocytes Absolute: 0.4 10*3/uL (ref 0.1–1.0)
Monocytes Relative: 2 %
Neutro Abs: 10 10*3/uL — ABNORMAL HIGH (ref 1.7–7.7)
Neutrophils Relative %: 69 %
Platelet Count: 208 10*3/uL (ref 150–400)
RBC: 3.84 MIL/uL — ABNORMAL LOW (ref 4.22–5.81)
RDW: 19.4 % — ABNORMAL HIGH (ref 11.5–15.5)
WBC Count: 14.6 10*3/uL — ABNORMAL HIGH (ref 4.0–10.5)
nRBC: 0 % (ref 0.0–0.2)

## 2020-04-10 MED ORDER — EPOETIN ALFA-EPBX 10000 UNIT/ML IJ SOLN
INTRAMUSCULAR | Status: AC
  Filled 2020-04-10: qty 3

## 2020-04-10 MED ORDER — EPOETIN ALFA-EPBX 10000 UNIT/ML IJ SOLN
30000.0000 [IU] | INTRAMUSCULAR | Status: DC
  Administered 2020-04-10: 30000 [IU] via SUBCUTANEOUS

## 2020-04-10 NOTE — Patient Instructions (Signed)

## 2020-04-12 ENCOUNTER — Other Ambulatory Visit: Payer: Self-pay | Admitting: Cardiovascular Disease

## 2020-04-15 MED ORDER — ISOSORBIDE MONONITRATE ER 60 MG PO TB24: 60.0000 mg | ORAL_TABLET | Freq: Every day | ORAL | 0 refills | Status: DC

## 2020-04-22 ENCOUNTER — Other Ambulatory Visit: Payer: Self-pay

## 2020-04-22 DIAGNOSIS — D638 Anemia in other chronic diseases classified elsewhere: Secondary | ICD-10-CM

## 2020-04-22 DIAGNOSIS — D469 Myelodysplastic syndrome, unspecified: Secondary | ICD-10-CM

## 2020-04-24 ENCOUNTER — Inpatient Hospital Stay: Payer: Medicare Other

## 2020-04-24 ENCOUNTER — Other Ambulatory Visit: Payer: Self-pay

## 2020-04-24 VITALS — BP 142/47 | HR 58 | Temp 98.3°F | Resp 16

## 2020-04-24 DIAGNOSIS — D638 Anemia in other chronic diseases classified elsewhere: Secondary | ICD-10-CM

## 2020-04-24 DIAGNOSIS — Z79899 Other long term (current) drug therapy: Secondary | ICD-10-CM | POA: Diagnosis not present

## 2020-04-24 DIAGNOSIS — D469 Myelodysplastic syndrome, unspecified: Secondary | ICD-10-CM

## 2020-04-24 DIAGNOSIS — C946 Myelodysplastic disease, not classified: Secondary | ICD-10-CM

## 2020-04-24 LAB — CBC WITH DIFFERENTIAL (CANCER CENTER ONLY)
Abs Immature Granulocytes: 0.5 10*3/uL — ABNORMAL HIGH (ref 0.00–0.07)
Basophils Absolute: 0.2 10*3/uL — ABNORMAL HIGH (ref 0.0–0.1)
Basophils Relative: 1 %
Eosinophils Absolute: 0.4 10*3/uL (ref 0.0–0.5)
Eosinophils Relative: 3 %
HCT: 30.9 % — ABNORMAL LOW (ref 39.0–52.0)
Hemoglobin: 10.3 g/dL — ABNORMAL LOW (ref 13.0–17.0)
Immature Granulocytes: 3 %
Lymphocytes Relative: 21 %
Lymphs Abs: 3.5 10*3/uL (ref 0.7–4.0)
MCH: 26.5 pg (ref 26.0–34.0)
MCHC: 33.3 g/dL (ref 30.0–36.0)
MCV: 79.6 fL — ABNORMAL LOW (ref 80.0–100.0)
Monocytes Absolute: 0.4 10*3/uL (ref 0.1–1.0)
Monocytes Relative: 3 %
Neutro Abs: 11.3 10*3/uL — ABNORMAL HIGH (ref 1.7–7.7)
Neutrophils Relative %: 69 %
Platelet Count: 232 10*3/uL (ref 150–400)
RBC: 3.88 MIL/uL — ABNORMAL LOW (ref 4.22–5.81)
RDW: 19.4 % — ABNORMAL HIGH (ref 11.5–15.5)
WBC Count: 16.3 10*3/uL — ABNORMAL HIGH (ref 4.0–10.5)
nRBC: 0 % (ref 0.0–0.2)

## 2020-04-24 MED ORDER — EPOETIN ALFA-EPBX 10000 UNIT/ML IJ SOLN
INTRAMUSCULAR | Status: AC
  Filled 2020-04-24: qty 3

## 2020-04-24 MED ORDER — EPOETIN ALFA-EPBX 10000 UNIT/ML IJ SOLN
30000.0000 [IU] | INTRAMUSCULAR | Status: DC
  Administered 2020-04-24: 30000 [IU] via SUBCUTANEOUS

## 2020-04-26 ENCOUNTER — Other Ambulatory Visit: Payer: Self-pay | Admitting: Cardiovascular Disease

## 2020-05-08 ENCOUNTER — Other Ambulatory Visit: Payer: Self-pay

## 2020-05-08 ENCOUNTER — Inpatient Hospital Stay: Payer: Medicare Other | Attending: Hematology

## 2020-05-08 ENCOUNTER — Inpatient Hospital Stay: Payer: Medicare Other

## 2020-05-08 VITALS — BP 140/55 | HR 55 | Temp 98.2°F | Resp 18

## 2020-05-08 DIAGNOSIS — D638 Anemia in other chronic diseases classified elsewhere: Secondary | ICD-10-CM

## 2020-05-08 DIAGNOSIS — D469 Myelodysplastic syndrome, unspecified: Secondary | ICD-10-CM | POA: Diagnosis not present

## 2020-05-08 DIAGNOSIS — Z79899 Other long term (current) drug therapy: Secondary | ICD-10-CM | POA: Diagnosis not present

## 2020-05-08 LAB — CBC WITH DIFFERENTIAL (CANCER CENTER ONLY)
Abs Immature Granulocytes: 0.49 10*3/uL — ABNORMAL HIGH (ref 0.00–0.07)
Basophils Absolute: 0.2 10*3/uL — ABNORMAL HIGH (ref 0.0–0.1)
Basophils Relative: 1 %
Eosinophils Absolute: 0.3 10*3/uL (ref 0.0–0.5)
Eosinophils Relative: 2 %
HCT: 30 % — ABNORMAL LOW (ref 39.0–52.0)
Hemoglobin: 10 g/dL — ABNORMAL LOW (ref 13.0–17.0)
Immature Granulocytes: 3 %
Lymphocytes Relative: 21 %
Lymphs Abs: 3.1 10*3/uL (ref 0.7–4.0)
MCH: 25.9 pg — ABNORMAL LOW (ref 26.0–34.0)
MCHC: 33.3 g/dL (ref 30.0–36.0)
MCV: 77.7 fL — ABNORMAL LOW (ref 80.0–100.0)
Monocytes Absolute: 0.5 10*3/uL (ref 0.1–1.0)
Monocytes Relative: 3 %
Neutro Abs: 10.1 10*3/uL — ABNORMAL HIGH (ref 1.7–7.7)
Neutrophils Relative %: 70 %
Platelet Count: 251 10*3/uL (ref 150–400)
RBC: 3.86 MIL/uL — ABNORMAL LOW (ref 4.22–5.81)
RDW: 18.8 % — ABNORMAL HIGH (ref 11.5–15.5)
WBC Count: 14.8 10*3/uL — ABNORMAL HIGH (ref 4.0–10.5)
nRBC: 0 % (ref 0.0–0.2)

## 2020-05-08 MED ORDER — EPOETIN ALFA-EPBX 10000 UNIT/ML IJ SOLN
INTRAMUSCULAR | Status: AC
  Filled 2020-05-08: qty 3

## 2020-05-08 MED ORDER — EPOETIN ALFA-EPBX 10000 UNIT/ML IJ SOLN
30000.0000 [IU] | INTRAMUSCULAR | Status: DC
  Administered 2020-05-08: 30000 [IU] via SUBCUTANEOUS

## 2020-05-08 NOTE — Patient Instructions (Signed)

## 2020-05-22 ENCOUNTER — Inpatient Hospital Stay: Payer: Medicare Other

## 2020-05-27 ENCOUNTER — Telehealth: Payer: Self-pay | Admitting: Hematology

## 2020-05-27 NOTE — Telephone Encounter (Signed)
Rescheduled upcoming appointment due to provider on call. Patient is aware of changes. 

## 2020-06-05 ENCOUNTER — Encounter: Payer: Self-pay | Admitting: Hematology

## 2020-06-05 ENCOUNTER — Inpatient Hospital Stay: Payer: Medicare Other

## 2020-06-05 ENCOUNTER — Inpatient Hospital Stay: Payer: Medicare Other | Attending: Hematology

## 2020-06-05 ENCOUNTER — Other Ambulatory Visit: Payer: Self-pay

## 2020-06-05 ENCOUNTER — Inpatient Hospital Stay (HOSPITAL_BASED_OUTPATIENT_CLINIC_OR_DEPARTMENT_OTHER): Payer: Medicare Other | Admitting: Hematology

## 2020-06-05 ENCOUNTER — Inpatient Hospital Stay: Payer: Medicare Other | Admitting: Hematology

## 2020-06-05 VITALS — BP 123/43 | HR 49 | Temp 96.1°F | Resp 17 | Ht 70.0 in | Wt 165.2 lb

## 2020-06-05 DIAGNOSIS — I714 Abdominal aortic aneurysm, without rupture: Secondary | ICD-10-CM | POA: Insufficient documentation

## 2020-06-05 DIAGNOSIS — D469 Myelodysplastic syndrome, unspecified: Secondary | ICD-10-CM

## 2020-06-05 DIAGNOSIS — I2510 Atherosclerotic heart disease of native coronary artery without angina pectoris: Secondary | ICD-10-CM | POA: Diagnosis not present

## 2020-06-05 DIAGNOSIS — K7460 Unspecified cirrhosis of liver: Secondary | ICD-10-CM | POA: Insufficient documentation

## 2020-06-05 DIAGNOSIS — E119 Type 2 diabetes mellitus without complications: Secondary | ICD-10-CM | POA: Diagnosis not present

## 2020-06-05 DIAGNOSIS — Z79899 Other long term (current) drug therapy: Secondary | ICD-10-CM | POA: Diagnosis not present

## 2020-06-05 DIAGNOSIS — E538 Deficiency of other specified B group vitamins: Secondary | ICD-10-CM | POA: Diagnosis not present

## 2020-06-05 DIAGNOSIS — D638 Anemia in other chronic diseases classified elsewhere: Secondary | ICD-10-CM

## 2020-06-05 DIAGNOSIS — F17210 Nicotine dependence, cigarettes, uncomplicated: Secondary | ICD-10-CM | POA: Diagnosis not present

## 2020-06-05 DIAGNOSIS — M549 Dorsalgia, unspecified: Secondary | ICD-10-CM | POA: Insufficient documentation

## 2020-06-05 DIAGNOSIS — J439 Emphysema, unspecified: Secondary | ICD-10-CM | POA: Insufficient documentation

## 2020-06-05 LAB — IRON AND TIBC
Iron: 54 ug/dL (ref 45–182)
Saturation Ratios: 28 % (ref 17.9–39.5)
TIBC: 195 ug/dL — ABNORMAL LOW (ref 250–450)
UIBC: 141 ug/dL

## 2020-06-05 LAB — CBC WITH DIFFERENTIAL (CANCER CENTER ONLY)
Abs Immature Granulocytes: 0.98 10*3/uL — ABNORMAL HIGH (ref 0.00–0.07)
Basophils Absolute: 0.2 10*3/uL — ABNORMAL HIGH (ref 0.0–0.1)
Basophils Relative: 1 %
Eosinophils Absolute: 0.5 10*3/uL (ref 0.0–0.5)
Eosinophils Relative: 3 %
HCT: 27.3 % — ABNORMAL LOW (ref 39.0–52.0)
Hemoglobin: 9.3 g/dL — ABNORMAL LOW (ref 13.0–17.0)
Immature Granulocytes: 5 %
Lymphocytes Relative: 18 %
Lymphs Abs: 3.5 10*3/uL (ref 0.7–4.0)
MCH: 26.3 pg (ref 26.0–34.0)
MCHC: 34.1 g/dL (ref 30.0–36.0)
MCV: 77.3 fL — ABNORMAL LOW (ref 80.0–100.0)
Monocytes Absolute: 1.5 10*3/uL — ABNORMAL HIGH (ref 0.1–1.0)
Monocytes Relative: 8 %
Neutro Abs: 12.8 10*3/uL — ABNORMAL HIGH (ref 1.7–7.7)
Neutrophils Relative %: 65 %
Platelet Count: 276 10*3/uL (ref 150–400)
RBC: 3.53 MIL/uL — ABNORMAL LOW (ref 4.22–5.81)
RDW: 18.6 % — ABNORMAL HIGH (ref 11.5–15.5)
WBC Count: 19.5 10*3/uL — ABNORMAL HIGH (ref 4.0–10.5)
nRBC: 0 % (ref 0.0–0.2)

## 2020-06-05 LAB — FERRITIN: Ferritin: 125 ng/mL (ref 24–336)

## 2020-06-05 MED ORDER — EPOETIN ALFA-EPBX 10000 UNIT/ML IJ SOLN
30000.0000 [IU] | INTRAMUSCULAR | Status: DC
  Administered 2020-06-05: 30000 [IU] via SUBCUTANEOUS

## 2020-06-05 MED ORDER — EPOETIN ALFA-EPBX 10000 UNIT/ML IJ SOLN
INTRAMUSCULAR | Status: AC
  Filled 2020-06-05: qty 3

## 2020-06-05 NOTE — Patient Instructions (Signed)

## 2020-06-05 NOTE — Progress Notes (Signed)
[place]   Telephone:(336) [phone] Fax:(336) [phone]   Clinic Follow up Note   Patient Care Team: Denita Lung, MD as PCP - General (Family Medicine) Burnell Blanks, MD as PCP - Cardiology (Cardiology)  Date of Service:  06/05/2020  CHIEF COMPLAINT: f/u of MDS/MPN, with mildleukocytosis and moderate anemia,JAK2 V617F (-)  CURRENT THERAPY:  Retacrit monthly started 10/30/18. Will increase to every 2 weeks starting 01/29/19. Increased to weekly on 05/07/19.Decreased back to every 2 weeks from 07/04/19. Increased to every 10 days on 08/09/19. Changed back to q14 days in 01/30/20.   INTERVAL HISTORY:  Paul [name] is here for a follow up of MDS/MPN. He was last seen by me on 03/13/20. He presents to the clinic alone.  He reports not feeling well. He reports feeling fatigued. He notes he doesn't do anything during the day. He still cooks, but doesn't do much else. He notes he missed his last shot because he "didn't feel like coming in." He denies shortness of breath, but notes taking a shower tires him. He also denies chest pain. He also reports back pain, but this is chronic. He notes he sustained a significant injury in 1977 after falling from a four-story building. This led to multiple surgeries and chronic back pain.   All other systems were reviewed with the patient and are negative.  MEDICAL HISTORY:  Past Medical History:  Diagnosis Date  . AAA (abdominal aortic aneurysm) ([place])   . Abnormal LFTs   . AKI (acute kidney injury) ([place])   . Anemia of chronic disease   . Arthritis    "all over" (12/04/2015)  . Atherosclerosis of native coronary artery of native heart without angina pectoris   . CAD (coronary artery disease)    with cath 09/26/08 showing severe heart disease in RCA with POBA, mild to mod diesease in circ and LAD  . Chronic back pain    back problems prior to surgery   . Constipation   . Coronary atherosclerosis of native coronary artery    . Dehydration   . Diabetes mellitus    with neuropathy  . Diarrhea   . Elevated liver enzymes   . Generalized weakness   . Gout of right hand   . Hiatal hernia   . History of gastroesophageal reflux (GERD)   . History of stomach ulcers   . Hyperlipidemia   . Hypertension   . Metabolic acidosis   . Normochromic anemia 02/23/2016  . 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 Thrombocytopenia ([place])   . Unintentional weight loss 09/27/2017   33 lbs between 4/19 & 8/19  . Vitamin B12 deficiency   . Vitamin B12 deficiency   . Vitamin D deficiency   . Weakness     SURGICAL HISTORY: Past Surgical History:  Procedure Laterality Date  . ANKLE FRACTURE SURGERY Left    "crushed"  . BACK SURGERY  X 3   "fell 4 stories in 1977"  . CARPAL TUNNEL RELEASE Left   . CORONARY ANGIOPLASTY WITH STENT PLACEMENT  09/26/2008   [name] 06/03/2010  . FRACTURE SURGERY    . LEFT HEART CATHETERIZATION WITH CORONARY ANGIOGRAM N/A 01/21/2012   Procedure: LEFT HEART CATHETERIZATION WITH CORONARY ANGIOGRAM;  Surgeon: Burnell Blanks, MD;  Location: [facility] CATH LAB;  Service: Cardiovascular;  Laterality: N/A;  . LUMBAR [place]; ~ 1980  . POSTERIOR LUMBAR FUSION  ~ 1998   "3rd OR to reconstruct my back after fall in 1977"  .  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  ALLERGIES:  is allergic to allopurinol and metformin and related.  MEDICATIONS:  Current Outpatient Medications  Medication Sig Dispense Refill  . amLODipine (NORVASC) 5 MG tablet TAKE 1 TABLET BY MOUTH  DAILY 90 tablet 3  . aspirin 81 MG chewable tablet Chew 81 mg by mouth daily.    . clopidogrel (PLAVIX) 75 MG tablet TAKE 1 TABLET BY MOUTH  DAILY 90 tablet 3  . ENBREL SURECLICK 50 MG/ML injection Inject 50 mg into the skin once a week.     . febuxostat (ULORIC) 40 MG tablet Take 40 mg by mouth daily.    . fish  oil-omega-3 fatty acids 1000 MG capsule Take 1 g by mouth 2 (two) times daily.     Marland Kitchen glucose blood (ACCU-CHEK AVIVA PLUS) test strip Test once to twice daily 200 each 4  . isosorbide mononitrate (IMDUR) 60 MG 24 hr tablet Take 1 tablet (60 mg total) by mouth at bedtime. Please make yearly appt with Dr. Angelena Form for April 2022 for future refills. Thank you 1st attempt 90 tablet 0  . leflunomide (ARAVA) 20 MG tablet Take 20 mg by mouth daily.    . metoprolol tartrate (LOPRESSOR) 25 MG tablet Take 1 tablet (25 mg total) by mouth 2 (two) times daily. Please make yearly appt with Dr. Angelena Form for April 2022 for future refills. Thank you 1st attempt 180 tablet 0  . Multiple Vitamins-Minerals (CENTRUM) tablet Take 1 tablet by mouth daily.    . nitroGLYCERIN (NITROSTAT) 0.4 MG SL tablet Place 0.4 mg under the tongue every 5 (five) minutes as needed for chest pain. For 3 doses    . pyridoxine (B-6) 100 MG tablet Take 100 mg by mouth daily.    . silver sulfADIAZINE (SILVADENE) 1 % cream Apply pea-sized amount to wound daily. 50 g 0  . simvastatin (ZOCOR) 20 MG tablet TAKE 1 TABLET BY MOUTH AT  BEDTIME 90 tablet 3   No current facility-administered medications for this visit.    PHYSICAL EXAMINATION: ECOG PERFORMANCE STATUS: 2 - Symptomatic, <50% confined to bed  Vitals:   06/05/20 1214  BP: (!) 123/43  Pulse: (!) 49  Resp: 17  Temp: (!) 96.1 F (35.6 C)  SpO2: 99%   Filed Weights   06/05/20 1214  Weight: 165 lb 3.2 oz (74.9 kg)    Due to COVID19 we will limit examination to appearance. Patient had no complaints.  GENERAL:alert, no distress and comfortable SKIN: skin color normal, no rashes or significant lesions EYES: normal, Conjunctiva are pink and non-injected, sclera clear  NEURO: alert & oriented x 3 with fluent speech, flat affect   LABORATORY DATA:  I have reviewed the data as listed CBC Latest Ref Rng & Units 06/05/2020 05/08/2020 04/24/2020  WBC 4.0 - 10.5 K/uL 19.5(H) 14.8(H)  16.3(H)  Hemoglobin 13.0 - 17.0 g/dL 9.3(L) 10.0(L) 10.3(L)  Hematocrit 39.0 - 52.0 % 27.3(L) 30.0(L) 30.9(L)  Platelets 150 - 400 K/uL 276 251 232     CMP Latest Ref Rng & Units 01/11/2020 12/31/2019 12/20/2019  Glucose 65 - 99 mg/dL 109(H) 123(H) 122(H)  BUN 8 - 27 mg/dL 27 24(H) 49(H)  Creatinine 0.76 - 1.27 mg/dL 1.27 1.28(H) 1.76(H)  Sodium 134 - 144 mmol/L 136 136 137  Potassium 3.5 - 5.2 mmol/L 5.0 4.4 4.3  Chloride 96 -  106 mmol/L 106 111 107  CO2 20 - 29 mmol/L 22 20(L) 25  Calcium 8.6 - 10.2 mg/dL 8.5(L) 8.9 8.6(L)  Total Protein 6.0 - 8.5 g/dL 7.7 - -  Total Bilirubin 0.0 - 1.2 mg/dL 0.7 - -  Alkaline Phos 44 - 121 IU/L 128(H) - -  AST 0 - 40 IU/L 26 - -  ALT 0 - 44 IU/L 17 - -      RADIOGRAPHIC STUDIES: I have personally reviewed the radiological images as listed and agreed with the findings in the report. No results found.   ASSESSMENT & PLAN:  Paul [name] is a 80 y.o. male with   1. MDS/MPN, JAK2 (-), cytogenetics normal -Hehas had anemia issues since mid 2017 andHghasin 9-10 range most of time, he also has mild leukocytosis, thrombocytosisresolved now -Other contributions to his anemia include stage III chronic renal insufficiency and chronic gouty arthritis.Previous iron study and B12 level were normal. 11/4/19Bone marrow is consistent with a myeloproliferative disorder with dysplastic features. -His leukocytosis is very mild, no thrombocytosis, risk of thrombosis is not very high, no need treatment such as hydrea.He is currently on aspirin and Plavix, which also helps to prevent thrombosis. -His anemia has worsened so I started him on treatment with monthly Retacrit beginning 10/30/18.He is currently onevery 2 weeks since 01/30/20.  -He missed his last shot due to fatigue. -He reports feeling poorly the last few days. -Labs reviewed, Hgb 9.3 today (06/05/20), slightly worse than before, likely due to the missing injection 2 weeks ago   -Continue lab and injection every 2 weeks    2.Gout, RA, back pain -He had a significant injury to his back in 1977 and has undergone four different surgeries for this -Had allergic reaction to Allopurinol  -Now controlled withcolchicine and Uloric -For his RA heis onEnbrel.  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 He currently smokes 1/2ppd. -CT chest from 11/2017 shows mild emphysema and small rightpleural effusion,pulmonary parenchymal fibrosis, probable UIP. -He wasstrongly encouraged to stop smoking to prevent further exacerbation and may need oxygen. -Previously recommended pulmonologyreferralbut patientdeclined -pulmonary symptoms and exam are stable overall  4. AAA, CAD with stent placed -followed by Dr. Doren Custard, vascular surgeon. -On Plavixand ASAwhich also helps reduce risk of thrombosis  5.Mild liver cirrhosis -h/o moderate alcohol;CT AP from 10/2017 which showed nodularity suspicious of cirrhosis.  6. H/o ofright LEDVT, provoked -Treated with 3 months of Xarelto, off now   PLAN: -lab reviewed -Proceed with Retacrit today at same dose 30K and continue every 14 days if Hg<11  -f/u in 4 months    No problem-specific Assessment & Plan notes found for this encounter.   No orders of the defined types were placed in this encounter.  All questions were answered. The patient knows to call the clinic with any problems, questions or concerns. No barriers to learning was detected. The total time spent in the appointment was 20 minutes.     Truitt Merle, MD 06/05/2020   I, Wilburn Mylar, am acting as scribe for Truitt Merle, MD.   I have reviewed the above documentation for accuracy and completeness, and I agree with the above.

## 2020-06-06 ENCOUNTER — Telehealth: Payer: Self-pay | Admitting: Hematology

## 2020-06-06 NOTE — Telephone Encounter (Signed)
Unable to leave message with follow-up appointments per 5/5 los. Mailed calendar.

## 2020-06-19 ENCOUNTER — Inpatient Hospital Stay: Payer: Medicare Other

## 2020-06-19 ENCOUNTER — Telehealth: Payer: Self-pay | Admitting: Cardiovascular Disease

## 2020-06-19 ENCOUNTER — Other Ambulatory Visit: Payer: Self-pay

## 2020-06-19 VITALS — BP 144/47 | HR 56 | Temp 98.1°F

## 2020-06-19 DIAGNOSIS — J439 Emphysema, unspecified: Secondary | ICD-10-CM | POA: Diagnosis not present

## 2020-06-19 DIAGNOSIS — Z79899 Other long term (current) drug therapy: Secondary | ICD-10-CM | POA: Diagnosis not present

## 2020-06-19 DIAGNOSIS — D638 Anemia in other chronic diseases classified elsewhere: Secondary | ICD-10-CM

## 2020-06-19 DIAGNOSIS — I714 Abdominal aortic aneurysm, without rupture: Secondary | ICD-10-CM | POA: Diagnosis not present

## 2020-06-19 DIAGNOSIS — E119 Type 2 diabetes mellitus without complications: Secondary | ICD-10-CM | POA: Diagnosis not present

## 2020-06-19 DIAGNOSIS — F17210 Nicotine dependence, cigarettes, uncomplicated: Secondary | ICD-10-CM | POA: Diagnosis not present

## 2020-06-19 DIAGNOSIS — D469 Myelodysplastic syndrome, unspecified: Secondary | ICD-10-CM

## 2020-06-19 DIAGNOSIS — E538 Deficiency of other specified B group vitamins: Secondary | ICD-10-CM | POA: Diagnosis not present

## 2020-06-19 DIAGNOSIS — K7460 Unspecified cirrhosis of liver: Secondary | ICD-10-CM | POA: Diagnosis not present

## 2020-06-19 DIAGNOSIS — M549 Dorsalgia, unspecified: Secondary | ICD-10-CM | POA: Diagnosis not present

## 2020-06-19 DIAGNOSIS — I2510 Atherosclerotic heart disease of native coronary artery without angina pectoris: Secondary | ICD-10-CM | POA: Diagnosis not present

## 2020-06-19 DIAGNOSIS — R0602 Shortness of breath: Secondary | ICD-10-CM

## 2020-06-19 LAB — CBC WITH DIFFERENTIAL (CANCER CENTER ONLY)
Abs Immature Granulocytes: 1.94 10*3/uL — ABNORMAL HIGH (ref 0.00–0.07)
Basophils Absolute: 0.3 10*3/uL — ABNORMAL HIGH (ref 0.0–0.1)
Basophils Relative: 1 %
Eosinophils Absolute: 0.4 10*3/uL (ref 0.0–0.5)
Eosinophils Relative: 2 %
HCT: 26.3 % — ABNORMAL LOW (ref 39.0–52.0)
Hemoglobin: 9.1 g/dL — ABNORMAL LOW (ref 13.0–17.0)
Immature Granulocytes: 7 %
Lymphocytes Relative: 17 %
Lymphs Abs: 4.4 10*3/uL — ABNORMAL HIGH (ref 0.7–4.0)
MCH: 26.3 pg (ref 26.0–34.0)
MCHC: 34.6 g/dL (ref 30.0–36.0)
MCV: 76 fL — ABNORMAL LOW (ref 80.0–100.0)
Monocytes Absolute: 2.4 10*3/uL — ABNORMAL HIGH (ref 0.1–1.0)
Monocytes Relative: 9 %
Neutro Abs: 17.3 10*3/uL — ABNORMAL HIGH (ref 1.7–7.7)
Neutrophils Relative %: 64 %
Platelet Count: 336 10*3/uL (ref 150–400)
RBC: 3.46 MIL/uL — ABNORMAL LOW (ref 4.22–5.81)
RDW: 18.6 % — ABNORMAL HIGH (ref 11.5–15.5)
WBC Count: 26.8 10*3/uL — ABNORMAL HIGH (ref 4.0–10.5)
nRBC: 0.1 % (ref 0.0–0.2)

## 2020-06-19 MED ORDER — EPOETIN ALFA-EPBX 10000 UNIT/ML IJ SOLN
INTRAMUSCULAR | Status: AC
  Filled 2020-06-19: qty 3

## 2020-06-19 MED ORDER — EPOETIN ALFA-EPBX 10000 UNIT/ML IJ SOLN
30000.0000 [IU] | INTRAMUSCULAR | Status: DC
  Administered 2020-06-19: 30000 [IU] via SUBCUTANEOUS

## 2020-06-19 NOTE — Telephone Encounter (Signed)
Pt c/o Shortness Of Breath: STAT if SOB developed within the last 24 hours or pt is noticeably SOB on the phone  1. Are you currently SOB (can you hear that pt is SOB on the phone)? yes  2. How long have you been experiencing SOB? Couple of weeks  3. Are you SOB when sitting or when up moving around? Moving around  4. Are you currently experiencing any other symptoms? no

## 2020-06-19 NOTE — Telephone Encounter (Signed)
Pt experiencing an increase in SOB.  Worsening about 3 weeks ago.   With just ADLs he has to sit 10 min to regain his breath. occas productive cough, esp in morning.  This is not new.   Mild chest pain across top of chest when he gets SOB, only occasionally and not new.  Takes IMDUR.  His most recent cbc was today.received epoetin injection today, receives every 2 weeks.  He did not discuss SOB at the cancer center today.  He called [place] for his annual visit appointment and there are no openings currently until October.  He is afraid he cannot wait that long.  (Due 4/22)  Pt aware I will review with Dr. Angelena Form and call him w recommendations.  He is available to come anytime there is a cancellation.

## 2020-06-20 NOTE — Telephone Encounter (Signed)
Spoke with patient.  He will come in on Thurs 8:00 am.  I scheduled his echo 07/18/20, and we can call him if there are any cancellations next week to try to complete before his office visit.

## 2020-06-20 NOTE — Telephone Encounter (Signed)
Can we put him in for next Thursday in one of the structural spots? Also, he will need his echo updated if possible to get that done before I see him next week. Thanks, chris

## 2020-06-20 NOTE — Addendum Note (Signed)
Addended by: Rodman Key on: 06/20/2020 05:16 PM   Modules accepted: Orders

## 2020-06-23 ENCOUNTER — Other Ambulatory Visit: Payer: Self-pay

## 2020-06-23 ENCOUNTER — Ambulatory Visit (HOSPITAL_COMMUNITY): Payer: Medicare Other | Attending: Cardiovascular Disease

## 2020-06-23 DIAGNOSIS — R0602 Shortness of breath: Secondary | ICD-10-CM | POA: Diagnosis not present

## 2020-06-23 LAB — ECHOCARDIOGRAM COMPLETE
AR max vel: 1.73 cm2
AV Area VTI: 1.7 cm2
AV Area mean vel: 1.73 cm2
AV Mean grad: 9 mmHg
AV Peak grad: 19 mmHg
Ao pk vel: 2.18 m/s
Area-P 1/2: 3.17 cm2
P 1/2 time: 272 msec
S' Lateral: 3.5 cm

## 2020-06-25 NOTE — Progress Notes (Signed)
0  Chief Complaint  Patient presents with  . Follow-up    CAD/Dyspnea   History of Present Illness: 80 yo male with history of CAD, DM, bilateral carotid artery disease, HTN, HLD, myelodysplastic disease, anemia of chronic disease and DVT here today for cardiology follow up. His echo in 2009 showed mild LVH and normal LV function with no significant valvular abnormalities. His cath in 2009 showed a near total occlusion of the RCA beyond the stent in the mid portion. I attempted PCI and was able to balloon the mid lesion but could never pass a balloon distally. He did well after that.  Cardiac cath in December 2013 in the setting of worsened chest pain/dsyspnea showed total occlusion of the RCA which was not amenable to PCI (PCI attempted in 2009). The LAD and Circumflex had moderate disease. Normal ABI 10/01/13. Carotid artery dopplers July 2018 with stable moderate bilateral ICA stenosis. He was found to have a DVT in November 2017 and was treated with Xarelto. He is off of Xarelto and back on ASA and Plavix. He has been followed by in the Heme/Onc clinic for unintentional weight loss and chronic macrocytic anemia felt to be a myelodysplastic disease. Malignancy workup was negative. Chest pain in August 2020 and admitted overnight with negative troponin. His chest pain occurred after eating two burgers. Echo 09/02/18 with LVEF=60-65%. Moderate AI. Mild AS. I saw him in October 2020 and he c/o chest pain at night and worsened when he was anemic. I saw him in January 2021 and we discussed having his anemia treated before any invasive cardiac workup. His Imdur was increased. His chest pain mostly resolved. Chest CTA October 2021 with pulmonary fibrosis that had progressed significantly since the last study with ground glass opacities. Small to moderate sized bilateral pleural effusions.   He called our office with c/o increased dyspnea over the past month. CBC in the cancer center on 06/19/20 with H/H 9.1/26.3.  Echo 06/23/20 with LVEF=55%. Normal RV function. Mild mitral regurgitation. Moderate aortic insufficiency. Mild aortic stenosis. He is following with Dr. Burr Medico in the [facility] for Epo injections every two weeks.   The patient denies any palpitations, lower extremity edema, orthopnea, PND, dizziness, near syncope or syncope.   Primary Care Physician: Denita Lung, MD  Past Medical History:  Diagnosis Date  . AAA (abdominal aortic aneurysm) ([place])   . Abnormal LFTs   . AKI (acute kidney injury) ([place])   . Anemia of chronic disease   . Arthritis    "all over" (12/04/2015)  . Atherosclerosis of native coronary artery of native heart without angina pectoris   . CAD (coronary artery disease)    with cath 09/26/08 showing severe heart disease in RCA with POBA, mild to mod diesease in circ and LAD  . Chronic back pain    back problems prior to surgery   . Constipation   . Coronary atherosclerosis of native coronary artery   . Dehydration   . Diabetes mellitus    with neuropathy  . Diarrhea   . Elevated liver enzymes   . Generalized weakness   . Gout of right hand   . Hiatal hernia   . History of gastroesophageal reflux (GERD)   . History of stomach ulcers   . Hyperlipidemia   . Hypertension   . Metabolic acidosis   . Normochromic anemia 02/23/2016  . 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 Thrombocytopenia ([place])   . Unintentional weight loss 09/27/2017   33 lbs between 4/19 &  8/19  . Vitamin B12 deficiency   . Vitamin B12 deficiency   . Vitamin D deficiency   . Weakness     Past Surgical History:  Procedure Laterality Date  . ANKLE FRACTURE SURGERY Left    "crushed"  . BACK SURGERY  X 3   "fell 4 stories in 1977"  . CARPAL TUNNEL RELEASE Left   . CORONARY ANGIOPLASTY WITH STENT PLACEMENT  09/26/2008   [name] 06/03/2010  . FRACTURE SURGERY    . LEFT HEART CATHETERIZATION WITH CORONARY ANGIOGRAM N/A 01/21/2012   Procedure: LEFT HEART CATHETERIZATION WITH CORONARY ANGIOGRAM;   Surgeon: Burnell Blanks, MD;  Location: [facility];  Service: Cardiovascular;  Laterality: N/A;  . LUMBAR [place]; ~ 1980  . POSTERIOR LUMBAR FUSION  ~ 1998   "3rd OR to reconstruct my back after fall in 1977"  . 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 amLODipine (NORVASC) 5 MG tablet TAKE 1 TABLET BY MOUTH  DAILY 90 tablet 3  . aspirin 81 MG chewable tablet Chew 81 mg by mouth daily.    . clopidogrel (PLAVIX) 75 MG tablet TAKE 1 TABLET BY MOUTH  DAILY 90 tablet 3  . ENBREL SURECLICK 50 MG/ML injection Inject 50 mg into the skin once a week.     . febuxostat (ULORIC) 40 MG tablet Take 40 mg by mouth daily.    . fish oil-omega-3 fatty acids 1000 MG capsule Take 1 g by mouth 2 (two) times daily.     Marland Kitchen glucose blood (ACCU-CHEK AVIVA PLUS) test strip Test once to twice daily 200 each 4  . isosorbide mononitrate (IMDUR) 60 MG 24 hr tablet Take 1 tablet (60 mg total) by mouth at bedtime. Please make yearly appt with Dr. Angelena Form for April 2022 for future refills. Thank you 1st attempt 90 tablet 0  . leflunomide (ARAVA) 20 MG tablet Take 20 mg by mouth daily.    . metoprolol tartrate (LOPRESSOR) 25 MG tablet Take 1 tablet (25 mg total) by mouth 2 (two) times daily. Please make yearly appt with Dr. Angelena Form for April 2022 for future refills. Thank you 1st attempt 180 tablet 0  . Multiple Vitamins-Minerals (CENTRUM) tablet Take 1 tablet by mouth daily.    . nitroGLYCERIN (NITROSTAT) 0.4 MG SL tablet Place 0.4 mg under the tongue every 5 (five) minutes as needed for chest pain. For 3 doses    . pyridoxine (B-6) 100 MG tablet Take 100 mg by mouth daily.    . silver sulfADIAZINE (SILVADENE) 1 % cream Apply pea-sized amount to wound daily. 50 g 0  . simvastatin (ZOCOR) 20 MG tablet TAKE 1 TABLET BY MOUTH AT  BEDTIME 90 tablet 3   No current facility-administered medications for this visit.    Allergies  Allergen  Reactions  . Allopurinol Other (See Comments)    thrombocytopenia  . Metformin And Related Diarrhea    Social History   Socioeconomic History  . Marital status: Married    Spouse name: Scientist, physiological  . Number of children: 0  . Years of education: Not on file  . Highest education level: Not on file  Occupational History  . Not on file  Tobacco Use  . Smoking status: Current Every Day Smoker    Packs/day: 0.50    Years: 20.00    Pack years: 10.00    Types: Cigars    Last attempt to  quit: 01/24/2013    Years since quitting: 7.4  . Smokeless tobacco: Never Used  Vaping Use  . Vaping Use: Never used  Substance and Sexual Activity  . Alcohol use: Not Currently  . Drug use: No  . Sexual activity: Not on file  Other Topics Concern  . Not on file  Social History Narrative   Divorced, 1 child.    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    Owned roofing co. In [place]   Social Determinants of Health   Financial Resource Strain: Not on file  Food Insecurity: Not on file  Transportation Needs: Not on file  Physical Activity: Not on file  Stress: Not on file  Social Connections: Not on file  Intimate Partner Violence: Not on file    Family History  Problem Relation Age of Onset  . Heart attack Mother   . Cancer Mother   . Heart disease Mother   . Heart attack Sister   . Heart disease Sister     Review of Systems:  As stated in the HPI and otherwise negative.   BP (!) 136/46   Pulse (!) 50   Ht 5\' 10"  (1.778 m)   Wt 162 lb 3.2 oz (73.6 kg)   SpO2 96%   BMI 23.27 kg/m   Physical Examination:  General: Well developed, well nourished, NAD  HEENT: OP clear, mucus membranes moist  SKIN: warm, dry. No rashes. Neuro: No focal deficits  Musculoskeletal: Muscle strength 5/5 all ext  Psychiatric: Mood and affect normal  Neck: No JVD, no carotid bruits, no thyromegaly, no lymphadenopathy.  Lungs:Clear bilaterally, no wheezes, rhonci,  crackles Cardiovascular: Regular rate and rhythm. No murmurs, gallops or rubs. Abdomen:Soft. Bowel sounds present. Non-tender.  Extremities: No lower extremity edema. Pulses are 2 + in the bilateral DP/PT.  Echo 06/23/20:  1. Left ventricular ejection fraction, by estimation, is 55%. The left  ventricle has normal function. The left ventricle has no regional wall  motion abnormalities. The left ventricular internal cavity size was mildly  dilated. There is mild left  ventricular hypertrophy. Left ventricular diastolic parameters were  normal.  2. Right ventricular systolic function is normal. The right ventricular  size is normal.  3. Left atrial size was moderately dilated.  4. The mitral valve is normal in structure. Mild mitral valve  regurgitation. No evidence of mitral stenosis. Moderate mitral annular  calcification.  5. Degree of AR similar to that seen on TTE 12/02/19 . The aortic valve  is normal in structure. There is moderate calcification of the aortic  valve. There is moderate thickening of the aortic valve. Aortic valve  regurgitation is moderate. Mild aortic  valve stenosis.  6. The inferior vena cava is dilated in size with >50% respiratory  variability, suggesting right atrial pressure of 8 mmHg.  EKG:  EKG is not ordered today. The ekg ordered today demonstrates   Recent Labs: 12/02/2019: B Natriuretic Peptide 610.7 12/04/2019: Magnesium 1.8 01/11/2020: ALT 17; BUN 27; Creatinine, Ser 1.27; Potassium 5.0; Sodium 136 06/19/2020: Hemoglobin 9.1; Platelet Count 336   Lipid Panel    Component Value Date/Time   CHOL 70 12/02/2019 1032   CHOL 101 06/12/2019 1331   TRIG 51 12/02/2019 1032   HDL 25 (L) 12/02/2019 1032   HDL 41 06/12/2019 1331   CHOLHDL 2.8 12/02/2019 1032   VLDL 10 12/02/2019 1032   LDLCALC 35 12/02/2019 1032   LDLCALC 47 06/12/2019 1331     Wt  Readings from Last 3 Encounters:  06/26/20 162 lb 3.2 oz (73.6 kg)  06/05/20 165 lb 3.2 oz  (74.9 kg)  03/13/20 168 lb 9 oz (76.5 kg)     Other studies Reviewed: Additional studies/ records that were reviewed today include: . Review of the above records demonstrates:    Assessment and Plan:   1. CAD with chronic stable angina: He has rare chest pains. His RCA is known to be occluded and not amenable to PCI. There is moderate disease in the LAD and Circumflex by cath in 2013. LV function normal by echo in May 2021. It is not clear that his dyspnea is an anginal component as he has many other reasons for dyspnea. (see below).  -I will arrange a nuclear stress test to exclude ischemia. With the national IV contrast shortage, will avoid cath if possible for now -Continue ASA, Plavix, statin, Imdur and beta blocker.    2. HTN: BP is controlled. No changes  3. Carotid artery disease: Moderate bilateral ICA stenosis, stable by dopplers July 2018. He does not wish to repeat.   4. Hyperlipidemia: LDL 35 in October 2021. Continue statin.      5. Aortic valve disease: Mild AS and moderate AI by echo May 2022.    6. Dyspnea: His dyspnea is felt to be multi-factorial. He is very anemia and is receiving Epo injections every other week. I reviewed his CTA chest from October 2021 and he has advanced lung disease with pulmonary fibrosis and bilateral pleural effusions. I think we need to consider that his dyspnea is not cardiac related.  -I will arrange a chest CT without contrast to evaluate his lungs. This will be done without contrast due to the national IV contrast dye shortage.   Current medicines are reviewed at length with the patient today.  The patient does not have concerns regarding medicines.  The following changes have been made:  no change  Labs/ tests ordered today include:   Orders Placed This Encounter  Procedures  . CT Chest Wo Contrast  . MYOCARDIAL PERFUSION IMAGING    Disposition:   FU with me in 12 months  Signed, Lauree Chandler, MD 06/26/2020 9:49 AM     [facility] [place], [place], [place]  [address] Phone: [phone]; Fax: [phone]

## 2020-06-26 ENCOUNTER — Encounter: Payer: Self-pay | Admitting: Cardiovascular Disease

## 2020-06-26 ENCOUNTER — Other Ambulatory Visit: Payer: Self-pay

## 2020-06-26 ENCOUNTER — Ambulatory Visit: Payer: Medicare Other | Admitting: Cardiovascular Disease

## 2020-06-26 VITALS — BP 136/46 | HR 50 | Ht 70.0 in | Wt 162.2 lb

## 2020-06-26 DIAGNOSIS — I351 Nonrheumatic aortic (valve) insufficiency: Secondary | ICD-10-CM | POA: Diagnosis not present

## 2020-06-26 DIAGNOSIS — R0602 Shortness of breath: Secondary | ICD-10-CM

## 2020-06-26 DIAGNOSIS — E7849 Other hyperlipidemia: Secondary | ICD-10-CM | POA: Diagnosis not present

## 2020-06-26 DIAGNOSIS — I10 Essential (primary) hypertension: Secondary | ICD-10-CM

## 2020-06-26 DIAGNOSIS — I25118 Atherosclerotic heart disease of native coronary artery with other forms of angina pectoris: Secondary | ICD-10-CM | POA: Diagnosis not present

## 2020-06-26 NOTE — Patient Instructions (Signed)
Medication Instructions:  No changes today *If you need a refill on your cardiac medications before your next appointment, please call your pharmacy*   Lab Work: none  Testing/Procedures: Your physician has requested that you have a lexiscan myoview. For further information please visit [email or web address]. Please follow instruction sheet, as given.  CT scan - chest, non contrast   Follow-Up: July 28, 2020 at 3:40 pm with Dr. Angelena Form  Other Instructions

## 2020-06-27 ENCOUNTER — Other Ambulatory Visit: Payer: Self-pay | Admitting: Cardiovascular Disease

## 2020-07-03 ENCOUNTER — Inpatient Hospital Stay: Payer: Medicare Other

## 2020-07-03 ENCOUNTER — Inpatient Hospital Stay: Payer: Medicare Other | Attending: Hematology

## 2020-07-03 ENCOUNTER — Other Ambulatory Visit: Payer: Self-pay

## 2020-07-03 VITALS — BP 139/41 | HR 56 | Temp 97.7°F | Resp 18

## 2020-07-03 DIAGNOSIS — D638 Anemia in other chronic diseases classified elsewhere: Secondary | ICD-10-CM

## 2020-07-03 DIAGNOSIS — Z79899 Other long term (current) drug therapy: Secondary | ICD-10-CM | POA: Diagnosis not present

## 2020-07-03 DIAGNOSIS — C946 Myelodysplastic disease, not classified: Secondary | ICD-10-CM

## 2020-07-03 DIAGNOSIS — D469 Myelodysplastic syndrome, unspecified: Secondary | ICD-10-CM

## 2020-07-03 LAB — CBC WITH DIFFERENTIAL (CANCER CENTER ONLY)
Abs Immature Granulocytes: 1.57 10*3/uL — ABNORMAL HIGH (ref 0.00–0.07)
Basophils Absolute: 0.4 10*3/uL — ABNORMAL HIGH (ref 0.0–0.1)
Basophils Relative: 1 %
Eosinophils Absolute: 0.5 10*3/uL (ref 0.0–0.5)
Eosinophils Relative: 2 %
HCT: 29.3 % — ABNORMAL LOW (ref 39.0–52.0)
Hemoglobin: 9.7 g/dL — ABNORMAL LOW (ref 13.0–17.0)
Immature Granulocytes: 6 %
Lymphocytes Relative: 19 %
Lymphs Abs: 4.9 10*3/uL — ABNORMAL HIGH (ref 0.7–4.0)
MCH: 26.2 pg (ref 26.0–34.0)
MCHC: 33.1 g/dL (ref 30.0–36.0)
MCV: 79.2 fL — ABNORMAL LOW (ref 80.0–100.0)
Monocytes Absolute: 4 10*3/uL — ABNORMAL HIGH (ref 0.1–1.0)
Monocytes Relative: 15 %
Neutro Abs: 14.3 10*3/uL — ABNORMAL HIGH (ref 1.7–7.7)
Neutrophils Relative %: 57 %
Platelet Count: 266 10*3/uL (ref 150–400)
RBC: 3.7 MIL/uL — ABNORMAL LOW (ref 4.22–5.81)
RDW: 19.9 % — ABNORMAL HIGH (ref 11.5–15.5)
WBC Count: 25.6 10*3/uL — ABNORMAL HIGH (ref 4.0–10.5)
nRBC: 0 % (ref 0.0–0.2)

## 2020-07-03 MED ORDER — EPOETIN ALFA-EPBX 10000 UNIT/ML IJ SOLN
INTRAMUSCULAR | Status: AC
  Filled 2020-07-03: qty 3

## 2020-07-03 MED ORDER — EPOETIN ALFA-EPBX 10000 UNIT/ML IJ SOLN
30000.0000 [IU] | INTRAMUSCULAR | Status: DC
  Administered 2020-07-03: 30000 [IU] via SUBCUTANEOUS

## 2020-07-03 NOTE — Patient Instructions (Signed)

## 2020-07-06 ENCOUNTER — Other Ambulatory Visit: Payer: Self-pay | Admitting: Cardiovascular Disease

## 2020-07-08 ENCOUNTER — Encounter (HOSPITAL_COMMUNITY): Payer: Self-pay | Admitting: *Deleted

## 2020-07-08 ENCOUNTER — Telehealth (HOSPITAL_COMMUNITY): Payer: Self-pay | Admitting: *Deleted

## 2020-07-08 NOTE — Telephone Encounter (Signed)
Letter outlining instructions for upcoming stress test sent via MyChart.  Test on 07/14/20 @10 :45

## 2020-07-10 DIAGNOSIS — M109 Gout, unspecified: Secondary | ICD-10-CM | POA: Diagnosis not present

## 2020-07-10 DIAGNOSIS — M25562 Pain in left knee: Secondary | ICD-10-CM | POA: Diagnosis not present

## 2020-07-10 DIAGNOSIS — R0602 Shortness of breath: Secondary | ICD-10-CM | POA: Diagnosis not present

## 2020-07-10 DIAGNOSIS — R634 Abnormal weight loss: Secondary | ICD-10-CM | POA: Diagnosis not present

## 2020-07-10 DIAGNOSIS — D508 Other iron deficiency anemias: Secondary | ICD-10-CM | POA: Diagnosis not present

## 2020-07-10 DIAGNOSIS — Z7189 Other specified counseling: Secondary | ICD-10-CM | POA: Diagnosis not present

## 2020-07-10 DIAGNOSIS — Z6822 Body mass index (BMI) 22.0-22.9, adult: Secondary | ICD-10-CM | POA: Diagnosis not present

## 2020-07-10 DIAGNOSIS — I82A11 Acute embolism and thrombosis of right axillary vein: Secondary | ICD-10-CM | POA: Diagnosis not present

## 2020-07-10 DIAGNOSIS — M0579 Rheumatoid arthritis with rheumatoid factor of multiple sites without organ or systems involvement: Secondary | ICD-10-CM | POA: Diagnosis not present

## 2020-07-10 DIAGNOSIS — N1831 Chronic kidney disease, stage 3a: Secondary | ICD-10-CM | POA: Diagnosis not present

## 2020-07-11 ENCOUNTER — Other Ambulatory Visit: Payer: Self-pay

## 2020-07-14 ENCOUNTER — Ambulatory Visit (INDEPENDENT_AMBULATORY_CARE_PROVIDER_SITE_OTHER)
Admission: RE | Admit: 2020-07-14 | Discharge: 2020-07-14 | Disposition: A | Discharge status: Home / Self Care | Payer: Medicare Other | Source: Ambulatory Visit | Attending: Cardiovascular Disease | Admitting: Cardiovascular Disease

## 2020-07-14 ENCOUNTER — Ambulatory Visit (HOSPITAL_COMMUNITY): Payer: Medicare Other | Attending: Cardiology

## 2020-07-14 ENCOUNTER — Other Ambulatory Visit: Payer: Self-pay

## 2020-07-14 DIAGNOSIS — I10 Essential (primary) hypertension: Secondary | ICD-10-CM | POA: Insufficient documentation

## 2020-07-14 DIAGNOSIS — E7849 Other hyperlipidemia: Secondary | ICD-10-CM | POA: Diagnosis not present

## 2020-07-14 DIAGNOSIS — I351 Nonrheumatic aortic (valve) insufficiency: Secondary | ICD-10-CM | POA: Insufficient documentation

## 2020-07-14 DIAGNOSIS — S22080A Wedge compression fracture of T11-T12 vertebra, initial encounter for closed fracture: Secondary | ICD-10-CM | POA: Diagnosis not present

## 2020-07-14 DIAGNOSIS — R0602 Shortness of breath: Secondary | ICD-10-CM

## 2020-07-14 DIAGNOSIS — J90 Pleural effusion, not elsewhere classified: Secondary | ICD-10-CM | POA: Diagnosis not present

## 2020-07-14 DIAGNOSIS — I25118 Atherosclerotic heart disease of native coronary artery with other forms of angina pectoris: Secondary | ICD-10-CM | POA: Insufficient documentation

## 2020-07-14 DIAGNOSIS — J929 Pleural plaque without asbestos: Secondary | ICD-10-CM | POA: Diagnosis not present

## 2020-07-14 DIAGNOSIS — R0600 Dyspnea, unspecified: Secondary | ICD-10-CM | POA: Diagnosis not present

## 2020-07-14 LAB — MYOCARDIAL PERFUSION IMAGING
LV dias vol: 151 mL (ref 62–150)
LV sys vol: 59 mL
Peak HR: 65 {beats}/min
Rest HR: 48 {beats}/min
SDS: 2
SRS: 0
SSS: 2
TID: 1.11

## 2020-07-14 MED ORDER — TECHNETIUM TC 99M TETROFOSMIN IV KIT
30.9000 | PACK | Freq: Once | INTRAVENOUS | Status: AC | PRN
  Administered 2020-07-14: 30.9 via INTRAVENOUS
  Filled 2020-07-14: qty 31

## 2020-07-14 MED ORDER — REGADENOSON 0.4 MG/5ML IV SOLN
0.4000 mg | Freq: Once | INTRAVENOUS | Status: AC
  Administered 2020-07-14: 0.4 mg via INTRAVENOUS

## 2020-07-14 MED ORDER — TECHNETIUM TC 99M TETROFOSMIN IV KIT
10.9000 | PACK | Freq: Once | INTRAVENOUS | Status: AC | PRN
Start: 2020-07-14 — End: 2020-07-14
  Administered 2020-07-14: 10.9 via INTRAVENOUS
  Filled 2020-07-14: qty 11

## 2020-07-17 ENCOUNTER — Inpatient Hospital Stay: Payer: Medicare Other

## 2020-07-17 ENCOUNTER — Other Ambulatory Visit: Payer: Self-pay

## 2020-07-17 VITALS — BP 144/41 | HR 50 | Temp 97.7°F | Resp 18

## 2020-07-17 DIAGNOSIS — Z79899 Other long term (current) drug therapy: Secondary | ICD-10-CM | POA: Diagnosis not present

## 2020-07-17 DIAGNOSIS — D469 Myelodysplastic syndrome, unspecified: Secondary | ICD-10-CM

## 2020-07-17 DIAGNOSIS — D638 Anemia in other chronic diseases classified elsewhere: Secondary | ICD-10-CM

## 2020-07-17 LAB — CBC WITH DIFFERENTIAL (CANCER CENTER ONLY)
Abs Immature Granulocytes: 2.1 10*3/uL — ABNORMAL HIGH (ref 0.00–0.07)
Basophils Absolute: 0.4 10*3/uL — ABNORMAL HIGH (ref 0.0–0.1)
Basophils Relative: 2 %
Eosinophils Absolute: 0.7 10*3/uL — ABNORMAL HIGH (ref 0.0–0.5)
Eosinophils Relative: 3 %
HCT: 28.3 % — ABNORMAL LOW (ref 39.0–52.0)
Hemoglobin: 9.5 g/dL — ABNORMAL LOW (ref 13.0–17.0)
Immature Granulocytes: 9 %
Lymphocytes Relative: 19 %
Lymphs Abs: 4.7 10*3/uL — ABNORMAL HIGH (ref 0.7–4.0)
MCH: 26.5 pg (ref 26.0–34.0)
MCHC: 33.6 g/dL (ref 30.0–36.0)
MCV: 78.8 fL — ABNORMAL LOW (ref 80.0–100.0)
Monocytes Absolute: 3.8 10*3/uL — ABNORMAL HIGH (ref 0.1–1.0)
Monocytes Relative: 15 %
Neutro Abs: 13.1 10*3/uL — ABNORMAL HIGH (ref 1.7–7.7)
Neutrophils Relative %: 52 %
Platelet Count: 154 10*3/uL (ref 150–400)
RBC: 3.59 MIL/uL — ABNORMAL LOW (ref 4.22–5.81)
RDW: 19.9 % — ABNORMAL HIGH (ref 11.5–15.5)
WBC Count: 24.6 10*3/uL — ABNORMAL HIGH (ref 4.0–10.5)
nRBC: 0 % (ref 0.0–0.2)

## 2020-07-17 MED ORDER — EPOETIN ALFA-EPBX 10000 UNIT/ML IJ SOLN
INTRAMUSCULAR | Status: AC
  Filled 2020-07-17: qty 3

## 2020-07-17 MED ORDER — EPOETIN ALFA-EPBX 10000 UNIT/ML IJ SOLN
30000.0000 [IU] | INTRAMUSCULAR | Status: DC
  Administered 2020-07-17: 30000 [IU] via SUBCUTANEOUS

## 2020-07-17 NOTE — Progress Notes (Signed)
MD Burr Medico confirmed pt does not need additional Reticulocyte Hemoglobin testing today.

## 2020-07-18 ENCOUNTER — Other Ambulatory Visit (HOSPITAL_COMMUNITY): Payer: Medicare Other

## 2020-07-24 ENCOUNTER — Encounter: Payer: Self-pay | Admitting: Hematology

## 2020-07-28 ENCOUNTER — Ambulatory Visit: Payer: Medicare Other | Admitting: Cardiovascular Disease

## 2020-07-28 ENCOUNTER — Encounter: Payer: Self-pay | Admitting: Cardiovascular Disease

## 2020-07-28 ENCOUNTER — Other Ambulatory Visit: Payer: Self-pay

## 2020-07-28 VITALS — BP 144/62 | HR 56 | Ht 70.5 in | Wt 161.8 lb

## 2020-07-28 DIAGNOSIS — I351 Nonrheumatic aortic (valve) insufficiency: Secondary | ICD-10-CM

## 2020-07-28 DIAGNOSIS — I25118 Atherosclerotic heart disease of native coronary artery with other forms of angina pectoris: Secondary | ICD-10-CM | POA: Diagnosis not present

## 2020-07-28 DIAGNOSIS — I10 Essential (primary) hypertension: Secondary | ICD-10-CM | POA: Diagnosis not present

## 2020-07-28 DIAGNOSIS — E785 Hyperlipidemia, unspecified: Secondary | ICD-10-CM | POA: Diagnosis not present

## 2020-07-28 DIAGNOSIS — R0602 Shortness of breath: Secondary | ICD-10-CM

## 2020-07-28 NOTE — Progress Notes (Signed)
0  Chief Complaint  Patient presents with   Follow-up    CAD, dyspnea    History of Present Illness: 80 yo male with history of CAD, DM, bilateral carotid artery disease, HTN, HLD, myelodysplastic disease, anemia of chronic disease and DVT here today for cardiology follow up. His echo in 2009 showed mild LVH and normal LV function with no significant valvular abnormalities. His cath in 2009 showed a near total occlusion of the RCA beyond the stent in the mid portion. I attempted PCI and was able to balloon the mid lesion but could never pass a balloon distally. He did well after that.  Cardiac cath in December 2013 in the setting of worsened chest pain/dsyspnea showed total occlusion of the RCA which was not amenable to PCI (PCI attempted in 2009). The LAD and Circumflex had moderate disease. Normal ABI 10/01/13. Carotid artery dopplers July 2018 with stable moderate bilateral ICA stenosis. He was found to have a DVT in November 2017 and was treated with Xarelto. He is off of Xarelto and back on ASA and Plavix. He has been followed by in the Heme/Onc clinic for unintentional weight loss and chronic macrocytic anemia felt to be a myelodysplastic disease. Malignancy workup was negative. Chest pain in August 2020 and admitted overnight with negative troponin. His chest pain occurred after eating two burgers. Echo 09/02/18 with LVEF=60-65%. Moderate AI. Mild AS. I saw him in October 2020 and he c/o chest pain at night and worsened when he was anemic. I saw him in January 2021 and we discussed having his anemia treated before any invasive cardiac workup. His Imdur was increased. His chest pain mostly resolved. Chest CTA October 2021 with pulmonary fibrosis that had progressed significantly since the last study with ground glass opacities. Small to moderate sized bilateral pleural effusions. He called our office with c/o increased dyspnea in May 2022. CBC in the cancer center on 06/19/20 with H/H 9.1/26.3. Echo  06/23/20 with LVEF=55%. Normal RV function. Mild mitral regurgitation. Moderate aortic insufficiency. Mild aortic stenosis. He is following with Dr. Burr Medico in the [facility] for Epo injections every two weeks. Nuclear stress test 07/14/20 with no ischemia. CT chest with interstitial lung disease, pleural plaques with small bilateral pleural effusion (possible asbestos related lung disease).   He is here today for follow up. The patient denies any chest pain, palpitations, lower extremity edema, orthopnea, PND, dizziness, near syncope or syncope. He has ongoing dyspnea.   Primary Care Physician: Denita Lung, MD  Past Medical History:  Diagnosis Date   AAA (abdominal aortic aneurysm) ([place])    Abnormal LFTs    AKI (acute kidney injury) ([place])    Anemia of chronic disease    Arthritis    "all over" (12/04/2015)   Atherosclerosis of native coronary artery of native heart without angina pectoris    CAD (coronary artery disease)    with cath 09/26/08 showing severe heart disease in RCA with POBA, mild to mod diesease in circ and LAD   Chronic back pain    back problems prior to surgery    Constipation    Coronary atherosclerosis of native coronary artery    Dehydration    Diabetes mellitus    with neuropathy   Diarrhea    Elevated liver enzymes    Generalized weakness    Gout of right hand    Hiatal hernia    History of gastroesophageal reflux (GERD)    History of stomach ulcers    Hyperlipidemia  Hypertension    Metabolic acidosis    Normochromic anemia 02/23/2016   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   Thrombocytopenia ([name])    Unintentional weight loss 09/27/2017   33 lbs between 4/19 & 8/19   Vitamin B12 deficiency    Vitamin B12 deficiency    Vitamin D deficiency    Weakness     Past Surgical History:  Procedure Laterality Date   ANKLE FRACTURE SURGERY Left    "crushed"   BACK SURGERY  X 3   "fell 4 stories in 1977"   [place]  09/26/2008   [name] 06/03/2010   FRACTURE SURGERY     LEFT HEART CATHETERIZATION WITH CORONARY ANGIOGRAM N/A 01/21/2012   Procedure: LEFT HEART CATHETERIZATION WITH CORONARY ANGIOGRAM;  Surgeon: Burnell Blanks, MD;  Location: [facility] CATH LAB;  Service: Cardiovascular;  Laterality: N/A;   [place]; ~ [place]  ~ 1998   "3rd OR to reconstruct my back after fall in 1977"   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 and 2nd    Current Outpatient Medications  Medication Sig Dispense Refill   amLODipine (NORVASC) 5 MG tablet TAKE 1 TABLET BY MOUTH  DAILY 90 tablet 3   aspirin 81 MG chewable tablet Chew 81 mg by mouth daily.     clopidogrel (PLAVIX) 75 MG tablet TAKE 1 TABLET BY MOUTH  DAILY 90 tablet 3   ENBREL SURECLICK 50 MG/ML injection Inject 50 mg into the skin once a week.      febuxostat (ULORIC) 40 MG tablet Take 40 mg by mouth daily.     fish oil-omega-3 fatty acids 1000 MG capsule Take 1 g by mouth 2 (two) times daily.      isosorbide mononitrate (IMDUR) 60 MG 24 hr tablet Take 1 tablet (60 mg total) by mouth at bedtime. 90 tablet 3   leflunomide (ARAVA) 20 MG tablet Take 20 mg by mouth daily.     metoprolol tartrate (LOPRESSOR) 25 MG tablet TAKE 1 TABLET BY MOUTH  TWICE DAILY 180 tablet 3   Multiple Vitamins-Minerals (CENTRUM) tablet Take 1 tablet by mouth daily.     nitroGLYCERIN (NITROSTAT) 0.4 MG SL tablet Place 0.4 mg under the tongue every 5 (five) minutes as needed for chest pain. For 3 doses     pyridoxine (B-6) 100 MG tablet Take 100 mg by mouth daily.     simvastatin (ZOCOR) 20 MG tablet TAKE 1 TABLET BY MOUTH AT  BEDTIME 90 tablet 3   No current facility-administered medications for this visit.    Allergies  Allergen Reactions   Allopurinol Other (See Comments)    thrombocytopenia   Metformin And Related Diarrhea    Social History   Socioeconomic History   Marital status: Married    Spouse name: Scientist, physiological   Number  of children: 0   Years of education: Not on file   Highest education level: Not on file  Occupational History   Not on file  Tobacco Use   Smoking status: Every Day    Packs/day: 0.50    Years: 20.00    Pack years: 10.00    Types: Cigars, Cigarettes    Last attempt to quit: 01/24/2013    Years since quitting: 7.5   Smokeless tobacco: Never  Vaping Use   Vaping Use: Never used  Substance and Sexual Activity   Alcohol use: Not Currently   Drug use:  No   Sexual activity: Not on file  Other Topics Concern   Not on file  Social History Narrative   Divorced, 1 child.    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   BP (!) 144/62   Pulse (!) 56   Ht 5' 10.5" (1.791 m)   Wt 161 lb 12.8 oz (73.4 kg)   SpO2 97%   BMI 22.89 kg/m   Physical Examination:  General: Well developed, well nourished, NAD  HEENT: OP clear, mucus membranes moist  SKIN: warm, dry. No rashes. Neuro: No focal deficits  Musculoskeletal: Muscle strength 5/5 all ext  Psychiatric: Mood and affect normal  Neck: No JVD, no carotid bruits, no thyromegaly, no lymphadenopathy.  Lungs:Dry crackles throughout with poor air movement.  Cardiovascular: Regular rate and rhythm. Soft diastolic murmur.  Abdomen:Soft. Bowel sounds present. Non-tender.  Extremities: No lower extremity edema.   Echo 06/23/20:  1. Left ventricular ejection fraction, by estimation, is 55%.  The left  ventricle has normal function. The left ventricle has no regional wall  motion abnormalities. The left ventricular internal cavity size was mildly  dilated. There is mild left  ventricular hypertrophy. Left ventricular diastolic parameters were  normal.   2. Right ventricular systolic function is normal. The right ventricular  size is normal.   3. Left atrial size was moderately dilated.   4. The mitral valve is normal in structure. Mild mitral valve  regurgitation. No evidence of mitral stenosis. Moderate mitral annular  calcification.   5. Degree of AR similar to that seen on TTE 12/02/19 . The aortic valve  is normal in structure. There is moderate calcification of the aortic  valve. There is moderate thickening of the aortic valve. Aortic valve  regurgitation is moderate. Mild aortic  valve stenosis.   6. The inferior vena cava is dilated in size with >50% respiratory  variability, suggesting right atrial pressure of 8 mmHg.   EKG:  EKG is not ordered today. The ekg ordered today demonstrates   Recent Labs: 12/02/2019: B Natriuretic Peptide 610.7 12/04/2019: Magnesium 1.8 01/11/2020: ALT 17; BUN 27; Creatinine, Ser 1.27; Potassium 5.0; Sodium 136 07/17/2020: Hemoglobin 9.5; Platelet Count 154   Lipid Panel    Component Value Date/Time   CHOL 70 12/02/2019 1032   CHOL 101 06/12/2019 1331   TRIG 51 12/02/2019 1032   HDL 25 (L) 12/02/2019 1032   HDL 41 06/12/2019 1331   CHOLHDL 2.8 12/02/2019 1032   VLDL 10 12/02/2019 1032   LDLCALC 35 12/02/2019 1032   LDLCALC 47 06/12/2019 1331     Wt Readings from Last 3 Encounters:  07/28/20 161 lb 12.8 oz (73.4 kg)  07/14/20 162 lb (73.5 kg)  06/26/20 162 lb 3.2 oz (73.6 kg)     Assessment and Plan:   1. CAD with  chronic stable angina: He has rare chest pains. His RCA is known to be occluded and not amenable to PCI. There is moderate disease in the LAD and Circumflex by cath in 2013. LV function normal by echo in May  2021. It is not clear that his dyspnea is an anginal component as he has many other reasons for dyspnea. Nuclear stress test June 2022 without ischemia.  -Continue ASA, Plavix, statin, Imdur and beta blocker.    2. HTN: BP is controlled. No changes today.   3. Carotid artery disease: Moderate bilateral ICA stenosis, stable by dopplers July 2018. He does not wish to repeat at this time  4. Hyperlipidemia: LDL 35 in October 2021. Continue statin.      5. Aortic valve disease: Mild AS and moderate AI by echo May 2022.    6. Dyspnea: His dyspnea is felt to be multi-factorial. He does have CAD but no ischemia on stress test 2 weeks ago. Echo with normal LV function. Moderate valve disease which does not explain his dyspnea in the absence of fluid retention. His weight is stable. No LE dema. He is very anemic and is receiving Epo injections every other week. CT chest June 2022 with possible asbestos related lung disease with calcified pleural plaques, small bilateral pleural effusions, interstitial lung disease.  He has been referred to the pulmonary clinic and is seeing Dr. Silas Flood on 08/18/20. It appears that his dyspnea is primarily related to his lung disease. I have asked him to stop smoking. He does not wish to stop.   Current medicines are reviewed at length with the patient today.  The patient does not have concerns regarding medicines.  The following changes have been made:  no change  Labs/ tests ordered today include:   No orders of the defined types were placed in this encounter.   Disposition:   F/U with me in 6 months  Signed, Lauree Chandler, MD 07/28/2020 3:57 PM    [facility] [place], [place], [place]  [address] Phone: [phone]; Fax: [phone]

## 2020-07-28 NOTE — Patient Instructions (Signed)
Medication Instructions:  Your physician recommends that you continue on your current medications as directed. Please refer to the Current Medication list given to you today.  *If you need a refill on your cardiac medications before your next appointment, please call your pharmacy*   Lab Work: None If you have labs (blood work) drawn today and your tests are completely normal, you will receive your results only by: . MyChart Message (if you have MyChart) OR . A paper copy in the mail If you have any lab test that is abnormal or we need to change your treatment, we will call you to review the results.   Testing/Procedures: None   Follow-Up: At [facility], you and your health needs are our priority.  As part of our continuing mission to provide you with exceptional heart care, we have created designated Provider Care Teams.  These Care Teams include your primary Cardiologist (physician) and Advanced Practice Providers (APPs -  Physician Assistants and Nurse Practitioners) who all work together to provide you with the care you need, when you need it.  We recommend signing up for the patient portal called "MyChart".  Sign up information is provided on this After Visit Summary.  MyChart is used to connect with patients for Virtual Visits (Telemedicine).  Patients are able to view lab/test results, encounter notes, upcoming appointments, etc.  Non-urgent messages can be sent to your provider as well.   To learn more about what you can do with MyChart, go to [email or web address].com.    Your next appointment:   6 month(s)  The format for your next appointment:   In Person  Provider:   You may see Christopher McAlhany, MD or one of the following Advanced Practice Providers on your designated Care Team:    Dayna Dunn, PA-C  Michele Lenze, PA-C    Other Instructions   

## 2020-07-31 ENCOUNTER — Other Ambulatory Visit: Payer: Self-pay

## 2020-07-31 ENCOUNTER — Inpatient Hospital Stay: Payer: Medicare Other

## 2020-07-31 VITALS — BP 142/68 | HR 55 | Temp 98.2°F | Resp 18

## 2020-07-31 DIAGNOSIS — D638 Anemia in other chronic diseases classified elsewhere: Secondary | ICD-10-CM

## 2020-07-31 DIAGNOSIS — D469 Myelodysplastic syndrome, unspecified: Secondary | ICD-10-CM

## 2020-07-31 DIAGNOSIS — Z79899 Other long term (current) drug therapy: Secondary | ICD-10-CM | POA: Diagnosis not present

## 2020-07-31 DIAGNOSIS — C946 Myelodysplastic disease, not classified: Secondary | ICD-10-CM

## 2020-07-31 LAB — CBC WITH DIFFERENTIAL (CANCER CENTER ONLY)
Abs Immature Granulocytes: 5.87 10*3/uL — ABNORMAL HIGH (ref 0.00–0.07)
Basophils Absolute: 0.4 10*3/uL — ABNORMAL HIGH (ref 0.0–0.1)
Basophils Relative: 1 %
Eosinophils Absolute: 0.7 10*3/uL — ABNORMAL HIGH (ref 0.0–0.5)
Eosinophils Relative: 2 %
HCT: 26 % — ABNORMAL LOW (ref 39.0–52.0)
Hemoglobin: 8.6 g/dL — ABNORMAL LOW (ref 13.0–17.0)
Immature Granulocytes: 14 %
Lymphocytes Relative: 13 %
Lymphs Abs: 5.3 10*3/uL — ABNORMAL HIGH (ref 0.7–4.0)
MCH: 26.5 pg (ref 26.0–34.0)
MCHC: 33.1 g/dL (ref 30.0–36.0)
MCV: 80 fL (ref 80.0–100.0)
Monocytes Absolute: 6.9 10*3/uL — ABNORMAL HIGH (ref 0.1–1.0)
Monocytes Relative: 16 %
Neutro Abs: 23 10*3/uL — ABNORMAL HIGH (ref 1.7–7.7)
Neutrophils Relative %: 54 %
Platelet Count: 175 10*3/uL (ref 150–400)
RBC: 3.25 MIL/uL — ABNORMAL LOW (ref 4.22–5.81)
RDW: 19.9 % — ABNORMAL HIGH (ref 11.5–15.5)
WBC Count: 42.3 10*3/uL — ABNORMAL HIGH (ref 4.0–10.5)
nRBC: 0.1 % (ref 0.0–0.2)

## 2020-07-31 MED ORDER — EPOETIN ALFA-EPBX 10000 UNIT/ML IJ SOLN
30000.0000 [IU] | INTRAMUSCULAR | Status: DC
  Administered 2020-07-31: 30000 [IU] via SUBCUTANEOUS

## 2020-07-31 MED ORDER — EPOETIN ALFA-EPBX 10000 UNIT/ML IJ SOLN
INTRAMUSCULAR | Status: AC
  Filled 2020-07-31: qty 3

## 2020-07-31 NOTE — Patient Instructions (Signed)

## 2020-08-01 ENCOUNTER — Telehealth: Payer: Self-pay

## 2020-08-01 ENCOUNTER — Other Ambulatory Visit: Payer: Self-pay

## 2020-08-01 DIAGNOSIS — D638 Anemia in other chronic diseases classified elsewhere: Secondary | ICD-10-CM

## 2020-08-01 NOTE — Telephone Encounter (Signed)
I spoke with Paul [name].  He states he is feeling better than yesterday.  He denies increased dyspnea, chest pain, and dark stools.  I told them that an appt with Dr Burr Medico was added on 08/12/2020.  He verbalized understanding.

## 2020-08-06 ENCOUNTER — Other Ambulatory Visit: Payer: Self-pay

## 2020-08-06 ENCOUNTER — Ambulatory Visit: Payer: Medicare Other | Admitting: Pulmonary Disease

## 2020-08-06 ENCOUNTER — Encounter: Payer: Self-pay | Admitting: Pulmonary Disease

## 2020-08-06 VITALS — BP 142/52 | HR 55 | Ht 70.5 in | Wt 158.0 lb

## 2020-08-06 DIAGNOSIS — J61 Pneumoconiosis due to asbestos and other mineral fibers: Secondary | ICD-10-CM

## 2020-08-06 DIAGNOSIS — J8410 Pulmonary fibrosis, unspecified: Secondary | ICD-10-CM | POA: Diagnosis not present

## 2020-08-06 MED ORDER — PREDNISONE 10 MG PO TABS: 10.0000 mg | ORAL_TABLET | Freq: Every day | ORAL | 0 refills | Status: DC

## 2020-08-06 MED ORDER — BREZTRI AEROSPHERE 160-9-4.8 MCG/ACT IN AERO: 2.0000 | INHALATION_SPRAY | Freq: Two times a day (BID) | RESPIRATORY_TRACT | 0 refills | Status: DC

## 2020-08-06 MED ORDER — BREZTRI AEROSPHERE 160-9-4.8 MCG/ACT IN AERO: 2.0000 | INHALATION_SPRAY | Freq: Two times a day (BID) | RESPIRATORY_TRACT | 6 refills | Status: DC

## 2020-08-06 NOTE — Progress Notes (Signed)
Synopsis: Referred in 09-17-20 for shortness of breath and pulmonary fibrosis by Paul Chimes, PA  Subjective:   PATIENT ID: Paul [name] GENDER: male DOB: August 08, 1940, MRN: [id number]   HPI  Chief Complaint  Patient presents with   Consult    Dyspnea and pulmonary fibrosis for a couple months now    Paul [name] is a 80 year old man, daily smoker with rheumatoid arthritis, myelodysplastic syndrome, coronary artery disease and DVT who is referred to pulmonary clinic for evaluation of pulmonary fibrosis and dyspnea.   He reports having increased shortness of breath since the beginning of the year.  He continues to smoke half a pack per day and has been smoking over the last 20 years.  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  He also reports weight loss over recent months.  Given his increasing shortness of breath and weight loss his rheumatologist has held his Enbrel recently.  He is followed by oncology for his myelodysplastic syndrome.  He has chronically elevated white blood cell count and anemia.  He does report productive cough and denies hemoptysis.  Past Medical History:  Diagnosis Date   AAA (abdominal aortic aneurysm) ([name])    Abnormal LFTs    AKI (acute kidney injury) ([place])    Anemia of chronic disease    Arthritis    "all over" (12/04/2015)   Atherosclerosis of native coronary artery of native heart without angina pectoris    CAD (coronary artery disease)    with cath 09/26/08 showing severe heart disease in RCA with POBA, mild to mod diesease in circ and LAD   Chronic back pain    back problems prior to surgery    Constipation    Coronary atherosclerosis of native coronary artery    Dehydration    Diabetes mellitus    with neuropathy   Diarrhea    Elevated liver enzymes    Generalized weakness    Gout of right hand    Hiatal hernia    History of gastroesophageal reflux (GERD)    History of stomach ulcers    Hyperlipidemia    Hypertension     Metabolic acidosis    Normochromic anemia 02/23/2016   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   Thrombocytopenia ([name])    Unintentional weight loss 09/27/2017   33 lbs between 4/19 & 8/19   Vitamin B12 deficiency    Vitamin B12 deficiency    Vitamin D deficiency    Weakness      Family History  Problem Relation Age of Onset   Heart attack Mother    Cancer Mother    Heart disease Mother    Heart attack Sister    Heart disease Sister      Social History   Socioeconomic History   Marital status: Married    Spouse name: Scientist, physiological   Number of children: 0   Years of education: Not on file   Highest education level: Not on file  Occupational History   Not on file  Tobacco Use   Smoking status: Every Day    Packs/day: 0.50    Years: 20.00    Pack years: 10.00    Types: Cigars, Cigarettes    Start date: 1961   Smokeless tobacco: Never   Tobacco comments:    Started smoking at age 11, quit at age 55, then restarted at age 35  Vaping Use   Vaping Use: Never used  Substance and Sexual Activity   Alcohol use: Not Currently  Drug use: No   Sexual activity: Not on file  Other Topics Concern   Not on file  Social History Narrative   Divorced, 1 child.    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    Owned roofing co. In [place]   Social Determinants of Health   Financial Resource Strain: Not on file  Food Insecurity: Not on file  Transportation Needs: Not on file  Physical Activity: Not on file  Stress: Not on file  Social Connections: Not on file  Intimate Partner Violence: Not on file     Allergies  Allergen Reactions   Allopurinol Other (See Comments)    thrombocytopenia   Metformin And Related Diarrhea     Outpatient Medications Prior to Visit  Medication Sig Dispense Refill   amLODipine (NORVASC) 5 MG tablet TAKE 1 TABLET BY MOUTH  DAILY 90 tablet 3   aspirin 81 MG chewable tablet Chew 81 mg by mouth daily.     clopidogrel (PLAVIX) 75  MG tablet TAKE 1 TABLET BY MOUTH  DAILY 90 tablet 3   ENBREL SURECLICK 50 MG/ML injection Inject 50 mg into the skin once a week.      febuxostat (ULORIC) 40 MG tablet Take 40 mg by mouth daily.     fish oil-omega-3 fatty acids 1000 MG capsule Take 1 g by mouth 2 (two) times daily.      isosorbide mononitrate (IMDUR) 60 MG 24 hr tablet Take 1 tablet (60 mg total) by mouth at bedtime. 90 tablet 3   leflunomide (ARAVA) 20 MG tablet Take 20 mg by mouth daily.     metoprolol tartrate (LOPRESSOR) 25 MG tablet TAKE 1 TABLET BY MOUTH  TWICE DAILY 180 tablet 3   Multiple Vitamins-Minerals (CENTRUM) tablet Take 1 tablet by mouth daily.     nitroGLYCERIN (NITROSTAT) 0.4 MG SL tablet Place 0.4 mg under the tongue every 5 (five) minutes as needed for chest pain. For 3 doses     pyridoxine (B-6) 100 MG tablet Take 100 mg by mouth daily.     simvastatin (ZOCOR) 20 MG tablet TAKE 1 TABLET BY MOUTH AT  BEDTIME 90 tablet 3   No facility-administered medications prior to visit.    Review of Systems  Constitutional:  Positive for weight loss. Negative for chills, fever and malaise/fatigue.  HENT:  Negative for congestion, sinus pain and sore throat.   Eyes: Negative.   Respiratory:  Positive for cough, sputum production and shortness of breath. Negative for hemoptysis and wheezing.   Cardiovascular:  Negative for chest pain, palpitations, orthopnea, claudication and leg swelling.  Gastrointestinal:  Negative for abdominal pain, heartburn, nausea and vomiting.  Genitourinary: Negative.   Musculoskeletal:  Positive for joint pain. Negative for myalgias.  Skin:  Negative for rash.  Neurological:  Negative for weakness.  Endo/Heme/Allergies: Negative.   Psychiatric/Behavioral: Negative.     Objective:   Vitals:   08/06/20 1559  BP: (!) 142/52  Pulse: (!) 55  SpO2: 93%  Weight: 158 lb (71.7 kg)  Height: 5' 10.5" (1.791 m)     Physical Exam Constitutional:      General: He is not in acute  distress.    Comments: Elderly male, pale appearing  HENT:     Head: Normocephalic and atraumatic.  Eyes:     Extraocular Movements: Extraocular movements intact.     Conjunctiva/sclera: Conjunctivae normal.     Pupils: Pupils are equal, round, and reactive to light.  Cardiovascular:  Rate and Rhythm: Normal rate and regular rhythm.     Pulses: Normal pulses.     Heart sounds: Normal heart sounds. No murmur heard. Pulmonary:     Effort: Pulmonary effort is normal.     Breath sounds: Rales (Dry Rales bilateral bases) present.  Abdominal:     General: Bowel sounds are normal.     Palpations: Abdomen is soft.  Musculoskeletal:     Right lower leg: No edema.     Left lower leg: No edema.  Lymphadenopathy:     Cervical: No cervical adenopathy.  Skin:    General: Skin is warm and dry.  Neurological:     General: No focal deficit present.     Mental Status: He is alert.  Psychiatric:        Mood and Affect: Mood normal.        Behavior: Behavior normal.        Thought Content: Thought content normal.        Judgment: Judgment normal.    CBC    Component Value Date/Time   WBC 42.3 (H) 07/31/2020 1418   WBC 16.0 (H) 12/05/2019 0138   RBC 3.25 (L) 07/31/2020 1418   HGB 8.6 (L) 07/31/2020 1418   HGB 10.4 (L) 01/11/2020 1338   HCT 26.0 (L) 07/31/2020 1418   HCT 31.5 (L) 01/11/2020 1338   PLT 175 07/31/2020 1418   PLT 198 01/11/2020 1338   MCV 80.0 07/31/2020 1418   MCV 79 01/11/2020 1338   MCH 26.5 07/31/2020 1418   MCHC 33.1 07/31/2020 1418   RDW 19.9 (H) 07/31/2020 1418   RDW 17.0 (H) 01/11/2020 1338   LYMPHSABS 5.3 (H) 07/31/2020 1418   LYMPHSABS 3.0 01/11/2020 1338   MONOABS 6.9 (H) 07/31/2020 1418   EOSABS 0.7 (H) 07/31/2020 1418   EOSABS 0.5 (H) 01/11/2020 1338   BASOSABS 0.4 (H) 07/31/2020 1418   BASOSABS 0.2 01/11/2020 1338     Chest imaging: CT Chest wo contrast 07/14/2020 Bilateral calcified pleural plaques small bilateral pleural effusions.  Basilar  predominant fibrotic interstitial lung disease and mild honeycombing, mildly progressed since 2019 HRCT chest.  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  New 5 mm right middle lobe nodule.  Dilated 4.1 cm ascending thoracic aorta.  Stable mediastinal lymphadenopathy.  CT CTA Chest 12/01/2019 Mediastinal adenopathy present.  Progression of his pulmonary fibrosis interlobular septal thickening.  Groundglass airspace opacities bilaterally most evident in the left upper lobe and perihilar distribution.  Small bilateral pleural effusions.  HRCT Chest 11/03/2017 Peripheral and basilar predominant subpleural reticulation and groundglass with traction bronchiectasis/bronchiolectasis, minimal change since 2017.  Mild paraseptal emphysema.  Calcified and noncalcified mediastinal and hilar lymph nodes.  PFT: No flowsheet data found.  Echo 06/23/20: LV EF 55%. LV normal function and size is mildly dilated and mild LV hypertrophy. RV systolic function is normal. Moderate aortic valve regurgitation. Left atrium mildly dilated.    Assessment & Plan:   Pulmonary fibrosis ([place]) - Plan: Pulmonary function test, Budeson-Glycopyrrol-Formoterol (BREZTRI AEROSPHERE) 160-9-4.8 MCG/ACT AERO, Budeson-Glycopyrrol-Formoterol (BREZTRI AEROSPHERE) 160-9-4.8 MCG/ACT AERO, Pulse oximetry, overnight  Asbestosis ([place])  Discussion:  Lani Mendiola is a 80 year old man, daily smoker with rheumatoid arthritis, myelodysplastic syndrome, coronary artery disease and DVT who is referred to pulmonary clinic for evaluation of pulmonary fibrosis and dyspnea.   He has minimal progression on his CT chest scans since 2017 of the peripheral and basilar predominant subpleural reticulation and groundglass findings with traction  bronchiectasis/bronchiolectasis.  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  Given the minimal progression over the last 5 years I do  not think the pulmonary fibrosis is the only etiology of his dyspnea at this time.  He may also have involvement from his rheumatoid arthritis but again his scans are minimally changed to account for his ongoing and progressive shortness of breath.  His anemia appears stable so difficult to say if his oncologic condition is contributing to his dyspnea as well.  We will try patient patient on Breztri inhaler 2 puffs twice daily.  We discussed smoking cessation but he is not interested in quitting smoking at this time.  We will schedule him for pulmonary function tests in the coming weeks for further assessment.  We will trial patient on high-dose steroid taper and monitor for any benefit in his dyspnea.  Taper as outlined in the patient instructions.  Follow-up in 4 weeks.  Freda Jackson, MD [facility] Office: [phone]   Current Outpatient Medications:    amLODipine (NORVASC) 5 MG tablet, TAKE 1 TABLET BY MOUTH  DAILY, Disp: 90 tablet, Rfl: 3   aspirin 81 MG chewable tablet, Chew 81 mg by mouth daily., Disp: , Rfl:    Budeson-Glycopyrrol-Formoterol (BREZTRI AEROSPHERE) 160-9-4.8 MCG/ACT AERO, Inhale 2 puffs into the lungs in the morning and at bedtime., Disp: 10.7 g, Rfl: 0   Budeson-Glycopyrrol-Formoterol (BREZTRI AEROSPHERE) 160-9-4.8 MCG/ACT AERO, Inhale 2 puffs into the lungs in the morning and at bedtime., Disp: 10.7 g, Rfl: 6   clopidogrel (PLAVIX) 75 MG tablet, TAKE 1 TABLET BY MOUTH  DAILY, Disp: 90 tablet, Rfl: 3   ENBREL SURECLICK 50 MG/ML injection, Inject 50 mg into the skin once a week. , Disp: , Rfl:    febuxostat (ULORIC) 40 MG tablet, Take 40 mg by mouth daily., Disp: , Rfl:    fish oil-omega-3 fatty acids 1000 MG capsule, Take 1 g by mouth 2 (two) times daily. , Disp: , Rfl:    isosorbide mononitrate (IMDUR) 60 MG 24 hr tablet, Take 1 tablet (60 mg total) by mouth at bedtime., Disp: 90 tablet, Rfl: 3   leflunomide (ARAVA) 20 MG tablet, Take 20 mg  by mouth daily., Disp: , Rfl:    metoprolol tartrate (LOPRESSOR) 25 MG tablet, TAKE 1 TABLET BY MOUTH  TWICE DAILY, Disp: 180 tablet, Rfl: 3   Multiple Vitamins-Minerals (CENTRUM) tablet, Take 1 tablet by mouth daily., Disp: , Rfl:    nitroGLYCERIN (NITROSTAT) 0.4 MG SL tablet, Place 0.4 mg under the tongue every 5 (five) minutes as needed for chest pain. For 3 doses, Disp: , Rfl:    pyridoxine (B-6) 100 MG tablet, Take 100 mg by mouth daily., Disp: , Rfl:    simvastatin (ZOCOR) 20 MG tablet, TAKE 1 TABLET BY MOUTH AT  BEDTIME, Disp: 90 tablet, Rfl: 3

## 2020-08-06 NOTE — Patient Instructions (Addendum)
Take prednisone 40mg  daily for 10 days (through July 16)  Then take 20mg  prednisone for 10 days (July 17 through July 26)  Then take 10mg  prednisone there after until follow up visit   Start breztri inhaler 2 puffs twice daily  We will schedule you for pulmonary function tests in the next week or two

## 2020-08-07 ENCOUNTER — Emergency Department (HOSPITAL_COMMUNITY): Payer: Medicare Other

## 2020-08-07 ENCOUNTER — Other Ambulatory Visit: Payer: Self-pay

## 2020-08-07 ENCOUNTER — Inpatient Hospital Stay (HOSPITAL_COMMUNITY)
Admission: EM | Admit: 2020-08-07 | Discharge: 2020-08-09 | DRG: 291 | Disposition: A | Discharge status: Home / Self Care | Payer: Medicare Other | Attending: Internal Medicine | Admitting: Internal Medicine

## 2020-08-07 ENCOUNTER — Encounter (HOSPITAL_COMMUNITY): Payer: Self-pay

## 2020-08-07 DIAGNOSIS — J849 Interstitial pulmonary disease, unspecified: Secondary | ICD-10-CM | POA: Diagnosis not present

## 2020-08-07 DIAGNOSIS — F17290 Nicotine dependence, other tobacco product, uncomplicated: Secondary | ICD-10-CM | POA: Diagnosis not present

## 2020-08-07 DIAGNOSIS — R079 Chest pain, unspecified: Secondary | ICD-10-CM | POA: Diagnosis not present

## 2020-08-07 DIAGNOSIS — C946 Myelodysplastic disease, not classified: Secondary | ICD-10-CM | POA: Diagnosis present

## 2020-08-07 DIAGNOSIS — Z7902 Long term (current) use of antithrombotics/antiplatelets: Secondary | ICD-10-CM

## 2020-08-07 DIAGNOSIS — E1140 Type 2 diabetes mellitus with diabetic neuropathy, unspecified: Secondary | ICD-10-CM | POA: Diagnosis not present

## 2020-08-07 DIAGNOSIS — Z955 Presence of coronary angioplasty implant and graft: Secondary | ICD-10-CM

## 2020-08-07 DIAGNOSIS — J8410 Pulmonary fibrosis, unspecified: Secondary | ICD-10-CM | POA: Diagnosis present

## 2020-08-07 DIAGNOSIS — Z8249 Family history of ischemic heart disease and other diseases of the circulatory system: Secondary | ICD-10-CM

## 2020-08-07 DIAGNOSIS — F17200 Nicotine dependence, unspecified, uncomplicated: Secondary | ICD-10-CM | POA: Diagnosis not present

## 2020-08-07 DIAGNOSIS — Z20822 Contact with and (suspected) exposure to covid-19: Secondary | ICD-10-CM | POA: Diagnosis present

## 2020-08-07 DIAGNOSIS — K219 Gastro-esophageal reflux disease without esophagitis: Secondary | ICD-10-CM | POA: Diagnosis present

## 2020-08-07 DIAGNOSIS — Z888 Allergy status to other drugs, medicaments and biological substances status: Secondary | ICD-10-CM

## 2020-08-07 DIAGNOSIS — E1169 Type 2 diabetes mellitus with other specified complication: Secondary | ICD-10-CM | POA: Diagnosis present

## 2020-08-07 DIAGNOSIS — Z981 Arthrodesis status: Secondary | ICD-10-CM

## 2020-08-07 DIAGNOSIS — R072 Precordial pain: Secondary | ICD-10-CM | POA: Diagnosis not present

## 2020-08-07 DIAGNOSIS — E538 Deficiency of other specified B group vitamins: Secondary | ICD-10-CM | POA: Diagnosis not present

## 2020-08-07 DIAGNOSIS — Z79899 Other long term (current) drug therapy: Secondary | ICD-10-CM

## 2020-08-07 DIAGNOSIS — Z7982 Long term (current) use of aspirin: Secondary | ICD-10-CM

## 2020-08-07 DIAGNOSIS — E871 Hypo-osmolality and hyponatremia: Secondary | ICD-10-CM | POA: Diagnosis not present

## 2020-08-07 DIAGNOSIS — N1831 Chronic kidney disease, stage 3a: Secondary | ICD-10-CM | POA: Diagnosis present

## 2020-08-07 DIAGNOSIS — I712 Thoracic aortic aneurysm, without rupture: Secondary | ICD-10-CM | POA: Diagnosis not present

## 2020-08-07 DIAGNOSIS — Z7901 Long term (current) use of anticoagulants: Secondary | ICD-10-CM

## 2020-08-07 DIAGNOSIS — I714 Abdominal aortic aneurysm, without rupture: Secondary | ICD-10-CM | POA: Diagnosis not present

## 2020-08-07 DIAGNOSIS — N1832 Chronic kidney disease, stage 3b: Secondary | ICD-10-CM | POA: Diagnosis not present

## 2020-08-07 DIAGNOSIS — R6889 Other general symptoms and signs: Secondary | ICD-10-CM | POA: Diagnosis not present

## 2020-08-07 DIAGNOSIS — R0602 Shortness of breath: Secondary | ICD-10-CM | POA: Diagnosis not present

## 2020-08-07 DIAGNOSIS — E559 Vitamin D deficiency, unspecified: Secondary | ICD-10-CM | POA: Diagnosis present

## 2020-08-07 DIAGNOSIS — E875 Hyperkalemia: Secondary | ICD-10-CM | POA: Diagnosis not present

## 2020-08-07 DIAGNOSIS — J90 Pleural effusion, not elsewhere classified: Secondary | ICD-10-CM | POA: Diagnosis not present

## 2020-08-07 DIAGNOSIS — E785 Hyperlipidemia, unspecified: Secondary | ICD-10-CM | POA: Diagnosis not present

## 2020-08-07 DIAGNOSIS — I252 Old myocardial infarction: Secondary | ICD-10-CM

## 2020-08-07 DIAGNOSIS — D61818 Other pancytopenia: Secondary | ICD-10-CM

## 2020-08-07 DIAGNOSIS — Z7952 Long term (current) use of systemic steroids: Secondary | ICD-10-CM

## 2020-08-07 DIAGNOSIS — Z743 Need for continuous supervision: Secondary | ICD-10-CM | POA: Diagnosis not present

## 2020-08-07 DIAGNOSIS — E1122 Type 2 diabetes mellitus with diabetic chronic kidney disease: Secondary | ICD-10-CM | POA: Diagnosis not present

## 2020-08-07 DIAGNOSIS — I130 Hypertensive heart and chronic kidney disease with heart failure and stage 1 through stage 4 chronic kidney disease, or unspecified chronic kidney disease: Principal | ICD-10-CM | POA: Diagnosis present

## 2020-08-07 DIAGNOSIS — Z720 Tobacco use: Secondary | ICD-10-CM | POA: Diagnosis not present

## 2020-08-07 DIAGNOSIS — M069 Rheumatoid arthritis, unspecified: Secondary | ICD-10-CM | POA: Diagnosis present

## 2020-08-07 DIAGNOSIS — I5033 Acute on chronic diastolic (congestive) heart failure: Secondary | ICD-10-CM | POA: Diagnosis not present

## 2020-08-07 DIAGNOSIS — I2510 Atherosclerotic heart disease of native coronary artery without angina pectoris: Secondary | ICD-10-CM | POA: Diagnosis not present

## 2020-08-07 DIAGNOSIS — D638 Anemia in other chronic diseases classified elsewhere: Secondary | ICD-10-CM | POA: Diagnosis not present

## 2020-08-07 DIAGNOSIS — D469 Myelodysplastic syndrome, unspecified: Secondary | ICD-10-CM | POA: Diagnosis present

## 2020-08-07 DIAGNOSIS — R0789 Other chest pain: Secondary | ICD-10-CM

## 2020-08-07 DIAGNOSIS — Z86718 Personal history of other venous thrombosis and embolism: Secondary | ICD-10-CM

## 2020-08-07 DIAGNOSIS — M059 Rheumatoid arthritis with rheumatoid factor, unspecified: Secondary | ICD-10-CM | POA: Diagnosis not present

## 2020-08-07 DIAGNOSIS — R0601 Orthopnea: Secondary | ICD-10-CM | POA: Diagnosis not present

## 2020-08-07 DIAGNOSIS — I517 Cardiomegaly: Secondary | ICD-10-CM | POA: Diagnosis not present

## 2020-08-07 LAB — COMPREHENSIVE METABOLIC PANEL
ALT: 19 U/L (ref 0–44)
AST: 24 U/L (ref 15–41)
Albumin: 2.7 g/dL — ABNORMAL LOW (ref 3.5–5.0)
Alkaline Phosphatase: 91 U/L (ref 38–126)
Anion gap: 6 (ref 5–15)
BUN: 33 mg/dL — ABNORMAL HIGH (ref 8–23)
CO2: 20 mmol/L — ABNORMAL LOW (ref 22–32)
Calcium: 8.6 mg/dL — ABNORMAL LOW (ref 8.9–10.3)
Chloride: 102 mmol/L (ref 98–111)
Creatinine, Ser: 1.52 mg/dL — ABNORMAL HIGH (ref 0.61–1.24)
GFR, Estimated: 47 mL/min — ABNORMAL LOW (ref >60)
Glucose, Bld: 274 mg/dL — ABNORMAL HIGH (ref 70–99)
Potassium: 5.3 mmol/L — ABNORMAL HIGH (ref 3.5–5.1)
Sodium: 128 mmol/L — ABNORMAL LOW (ref 135–145)
Total Bilirubin: 1.1 mg/dL (ref 0.3–1.2)
Total Protein: 7.8 g/dL (ref 6.5–8.1)

## 2020-08-07 LAB — TROPONIN I (HIGH SENSITIVITY)
Troponin I (High Sensitivity): 9 ng/L (ref <18)
Troponin I (High Sensitivity): 11 ng/L (ref <18)
Troponin I (High Sensitivity): 10 ng/L (ref <18)

## 2020-08-07 LAB — RESP PANEL BY RT-PCR (FLU A&B, COVID) ARPGX2
Influenza A by PCR: NEGATIVE
Influenza B by PCR: NEGATIVE
SARS Coronavirus 2 by RT PCR: NEGATIVE

## 2020-08-07 LAB — CBG MONITORING, ED: Glucose-Capillary: 201 mg/dL — ABNORMAL HIGH (ref 70–99)

## 2020-08-07 LAB — CBC WITH DIFFERENTIAL/PLATELET
Abs Immature Granulocytes: 13.35 10*3/uL — ABNORMAL HIGH (ref 0.00–0.07)
Basophils Absolute: 0.3 10*3/uL — ABNORMAL HIGH (ref 0.0–0.1)
Basophils Relative: 0 %
Eosinophils Absolute: 0.1 10*3/uL (ref 0.0–0.5)
Eosinophils Relative: 0 %
HCT: 28.6 % — ABNORMAL LOW (ref 39.0–52.0)
Hemoglobin: 9.4 g/dL — ABNORMAL LOW (ref 13.0–17.0)
Immature Granulocytes: 19 %
Lymphocytes Relative: 6 %
Lymphs Abs: 4.2 10*3/uL — ABNORMAL HIGH (ref 0.7–4.0)
MCH: 27.4 pg (ref 26.0–34.0)
MCHC: 32.9 g/dL (ref 30.0–36.0)
MCV: 83.4 fL (ref 80.0–100.0)
Monocytes Absolute: 2.4 10*3/uL — ABNORMAL HIGH (ref 0.1–1.0)
Monocytes Relative: 3 %
Neutro Abs: 51.6 10*3/uL — ABNORMAL HIGH (ref 1.7–7.7)
Neutrophils Relative %: 72 %
Platelets: 221 10*3/uL (ref 150–400)
RBC: 3.43 MIL/uL — ABNORMAL LOW (ref 4.22–5.81)
RDW: 21.1 % — ABNORMAL HIGH (ref 11.5–15.5)
WBC: 71.8 10*3/uL (ref 4.0–10.5)
nRBC: 0.2 % (ref 0.0–0.2)

## 2020-08-07 LAB — GLUCOSE, CAPILLARY: Glucose-Capillary: 174 mg/dL — ABNORMAL HIGH (ref 70–99)

## 2020-08-07 LAB — BRAIN NATRIURETIC PEPTIDE: B Natriuretic Peptide: 634.6 pg/mL — ABNORMAL HIGH (ref 0.0–100.0)

## 2020-08-07 MED ORDER — IPRATROPIUM-ALBUTEROL 0.5-2.5 (3) MG/3ML IN SOLN
3.0000 mL | Freq: Once | RESPIRATORY_TRACT | Status: AC
  Administered 2020-08-07: 3 mL via RESPIRATORY_TRACT
  Filled 2020-08-07: qty 3

## 2020-08-07 MED ORDER — INSULIN ASPART 100 UNIT/ML IJ SOLN
0.0000 [IU] | Freq: Three times a day (TID) | INTRAMUSCULAR | Status: DC
  Administered 2020-08-08: 2 [IU] via SUBCUTANEOUS
  Administered 2020-08-08: 3 [IU] via SUBCUTANEOUS
  Administered 2020-08-08: 1 [IU] via SUBCUTANEOUS
  Administered 2020-08-09: 2 [IU] via SUBCUTANEOUS

## 2020-08-07 MED ORDER — ENOXAPARIN SODIUM 40 MG/0.4ML IJ SOSY
40.0000 mg | PREFILLED_SYRINGE | INTRAMUSCULAR | Status: DC
  Administered 2020-08-07 – 2020-08-08 (×2): 40 mg via SUBCUTANEOUS
  Filled 2020-08-07 (×2): qty 0.4

## 2020-08-07 MED ORDER — METOPROLOL TARTRATE 25 MG PO TABS
25.0000 mg | ORAL_TABLET | Freq: Two times a day (BID) | ORAL | Status: DC
  Administered 2020-08-08 – 2020-08-09 (×3): 25 mg via ORAL
  Filled 2020-08-07 (×3): qty 1

## 2020-08-07 MED ORDER — UMECLIDINIUM BROMIDE 62.5 MCG/INH IN AEPB
1.0000 | INHALATION_SPRAY | Freq: Every day | RESPIRATORY_TRACT | Status: DC
  Administered 2020-08-08 – 2020-08-09 (×2): 1 via RESPIRATORY_TRACT
  Filled 2020-08-07: qty 7

## 2020-08-07 MED ORDER — SIMVASTATIN 20 MG PO TABS
20.0000 mg | ORAL_TABLET | Freq: Every evening | ORAL | Status: DC
  Administered 2020-08-08 (×2): 20 mg via ORAL
  Filled 2020-08-07 (×2): qty 1

## 2020-08-07 MED ORDER — LEFLUNOMIDE 20 MG PO TABS
20.0000 mg | ORAL_TABLET | Freq: Every day | ORAL | Status: DC
  Administered 2020-08-08 – 2020-08-09 (×2): 20 mg via ORAL
  Filled 2020-08-07 (×2): qty 1

## 2020-08-07 MED ORDER — FUROSEMIDE 10 MG/ML IJ SOLN
INTRAMUSCULAR | Status: AC
  Administered 2020-08-07: 40 mg via INTRAVENOUS
  Filled 2020-08-07: qty 4

## 2020-08-07 MED ORDER — ONDANSETRON HCL 4 MG/2ML IJ SOLN: 4.0000 mg | Freq: Once | INTRAMUSCULAR | Status: DC

## 2020-08-07 MED ORDER — CLOPIDOGREL BISULFATE 75 MG PO TABS
75.0000 mg | ORAL_TABLET | Freq: Every day | ORAL | Status: DC
  Administered 2020-08-08 – 2020-08-09 (×2): 75 mg via ORAL
  Filled 2020-08-07 (×2): qty 1

## 2020-08-07 MED ORDER — MORPHINE SULFATE (PF) 4 MG/ML IV SOLN
4.0000 mg | Freq: Once | INTRAVENOUS | Status: DC
Start: 2020-08-07 — End: 2020-08-07

## 2020-08-07 MED ORDER — ASPIRIN 325 MG PO TABS
325.0000 mg | ORAL_TABLET | Freq: Every day | ORAL | Status: DC
  Filled 2020-08-07: qty 1

## 2020-08-07 MED ORDER — FEBUXOSTAT 40 MG PO TABS
40.0000 mg | ORAL_TABLET | Freq: Every day | ORAL | Status: DC
  Administered 2020-08-08 – 2020-08-09 (×2): 40 mg via ORAL
  Filled 2020-08-07 (×2): qty 1

## 2020-08-07 MED ORDER — ACETAMINOPHEN 650 MG RE SUPP: 650.0000 mg | Freq: Four times a day (QID) | RECTAL | Status: DC | PRN

## 2020-08-07 MED ORDER — FLUTICASONE FUROATE-VILANTEROL 200-25 MCG/INH IN AEPB
1.0000 | INHALATION_SPRAY | Freq: Every day | RESPIRATORY_TRACT | Status: DC
  Administered 2020-08-08 – 2020-08-09 (×2): 1 via RESPIRATORY_TRACT
  Filled 2020-08-07: qty 28

## 2020-08-07 MED ORDER — PREDNISONE 20 MG PO TABS
40.0000 mg | ORAL_TABLET | Freq: Every day | ORAL | Status: DC
  Administered 2020-08-08 – 2020-08-09 (×2): 40 mg via ORAL
  Filled 2020-08-07 (×2): qty 2

## 2020-08-07 MED ORDER — SODIUM CHLORIDE 0.9% FLUSH: 3.0000 mL | INTRAVENOUS | Status: DC | PRN

## 2020-08-07 MED ORDER — SODIUM CHLORIDE 0.9 % IV BOLUS
500.0000 mL | Freq: Once | INTRAVENOUS | Status: AC
  Administered 2020-08-07: 500 mL via INTRAVENOUS

## 2020-08-07 MED ORDER — ACETAMINOPHEN 325 MG PO TABS
650.0000 mg | ORAL_TABLET | Freq: Four times a day (QID) | ORAL | Status: DC | PRN
  Administered 2020-08-08 – 2020-08-09 (×2): 650 mg via ORAL
  Filled 2020-08-07: qty 2

## 2020-08-07 MED ORDER — BUDESON-GLYCOPYRROL-FORMOTEROL 160-9-4.8 MCG/ACT IN AERO: 2.0000 | INHALATION_SPRAY | Freq: Two times a day (BID) | RESPIRATORY_TRACT | Status: DC

## 2020-08-07 MED ORDER — NICOTINE 14 MG/24HR TD PT24
14.0000 mg | MEDICATED_PATCH | Freq: Every day | TRANSDERMAL | Status: DC
  Administered 2020-08-07 – 2020-08-09 (×3): 14 mg via TRANSDERMAL
  Filled 2020-08-07 (×3): qty 1

## 2020-08-07 MED ORDER — SODIUM CHLORIDE 0.9% FLUSH
3.0000 mL | Freq: Two times a day (BID) | INTRAVENOUS | Status: DC
  Administered 2020-08-07 – 2020-08-09 (×4): 3 mL via INTRAVENOUS

## 2020-08-07 MED ORDER — SODIUM CHLORIDE 0.9 % IV SOLN: 250.0000 mL | INTRAVENOUS | Status: DC | PRN

## 2020-08-07 MED ORDER — ASPIRIN 81 MG PO CHEW
81.0000 mg | CHEWABLE_TABLET | Freq: Every day | ORAL | Status: DC
  Administered 2020-08-08 – 2020-08-09 (×2): 81 mg via ORAL
  Filled 2020-08-07 (×2): qty 1

## 2020-08-07 MED ORDER — FUROSEMIDE 10 MG/ML IJ SOLN: 40.0000 mg | Freq: Once | INTRAMUSCULAR | Status: AC

## 2020-08-07 MED ORDER — NITROGLYCERIN 0.4 MG SL SUBL
0.4000 mg | SUBLINGUAL_TABLET | SUBLINGUAL | Status: DC | PRN
  Administered 2020-08-07 – 2020-08-08 (×5): 0.4 mg via SUBLINGUAL
  Filled 2020-08-07 (×2): qty 1

## 2020-08-07 MED ORDER — AMLODIPINE BESYLATE 5 MG PO TABS
5.0000 mg | ORAL_TABLET | Freq: Every day | ORAL | Status: DC
  Administered 2020-08-08 – 2020-08-09 (×2): 5 mg via ORAL
  Filled 2020-08-07 (×2): qty 1

## 2020-08-07 MED ORDER — ALBUTEROL SULFATE HFA 108 (90 BASE) MCG/ACT IN AERS
2.0000 | INHALATION_SPRAY | Freq: Once | RESPIRATORY_TRACT | Status: AC
  Administered 2020-08-07: 2 via RESPIRATORY_TRACT
  Filled 2020-08-07: qty 6.7

## 2020-08-07 MED ORDER — ISOSORBIDE MONONITRATE ER 30 MG PO TB24: 90.0000 mg | ORAL_TABLET | Freq: Every day | ORAL | Status: DC

## 2020-08-07 MED ORDER — METHYLPREDNISOLONE SODIUM SUCC 125 MG IJ SOLR
125.0000 mg | Freq: Once | INTRAMUSCULAR | Status: AC
  Administered 2020-08-07: 125 mg via INTRAVENOUS
  Filled 2020-08-07: qty 2

## 2020-08-07 MED ORDER — ADULT MULTIVITAMIN W/MINERALS CH
1.0000 | ORAL_TABLET | Freq: Every day | ORAL | Status: DC
  Administered 2020-08-08 – 2020-08-09 (×2): 1 via ORAL
  Filled 2020-08-07 (×2): qty 1

## 2020-08-07 MED ORDER — ISOSORBIDE MONONITRATE ER 60 MG PO TB24
90.0000 mg | ORAL_TABLET | Freq: Every day | ORAL | Status: DC
  Administered 2020-08-07 – 2020-08-09 (×3): 90 mg via ORAL
  Filled 2020-08-07 (×3): qty 1

## 2020-08-07 NOTE — ED Notes (Signed)
Patient is sitting up in chair at bedside. Patient is A/O, denies chest pain and SOB at this time. O2 via [place] at 2 L/M continues. Urinal within reach. Patient was educated on effects of Lasix and was told that he would probably have to urinate often.

## 2020-08-07 NOTE — ED Notes (Signed)
Patient back in bed at this time. Resting with eyes closed.

## 2020-08-07 NOTE — Progress Notes (Signed)
EKG done on pt and placed on chart. Dr. Rudi Rummage notified about event.

## 2020-08-07 NOTE — ED Notes (Signed)
Patient is reclined on stretcher. Says he feels better after receiving neb treatment.

## 2020-08-07 NOTE — ED Notes (Signed)
Patient offers no complaints at this time. VS monitored and recorded. Awaiting room assignment for admission. Admitting provider in to talk with patient regarding admission.

## 2020-08-07 NOTE — ED Provider Notes (Signed)
  Face-to-face evaluation   History: He presents for evaluation of chest pain and shortness of breath.  He has a history of coronary artery disease and pulmonary fibrosis.  He states he was newly diagnosed with pulmonary fibrosis yesterday, started on an inhaler, and prednisone.  Today he was sitting when he developed chest pain.  He was not exerting himself.  Pain was reminiscent of prior cardiac problems, so he took 4 nitroglycerin over a period of 2 hours with partial relief.  He decided to call EMS who transferred him here for evaluation.  He states he has taken nitroglycerin 3 times over the last year for similar types of pain.  He states that his cardiologist was considering catheterizing him about a month ago but deferred that because of the IV contrast shortage.  He has been seen by hematology, for myelodysplastic disorder.  He gets periodic Epogen injections.  He is an ongoing smoker.  Patient denies other recent illnesses including fever, chills, nausea, vomiting, focal weakness or paresthesia.  Physical exam: Elderly man, lying semirecumbent, no apparent distress lungs with scattered rhonchi but no rales, wheezing or increased work of breathing.  Heart regular rate and rhythm without murmur.  Abdomen soft nontender.  Chest nontender to palpation.  Medical screening examination/treatment/procedure(s) were conducted as a shared visit with non-physician practitioner(s) and myself.  I personally evaluated the patient during the encounter    Daleen Bo, MD 08/09/20 1357

## 2020-08-07 NOTE — Progress Notes (Addendum)
2152; Pt called stating felt SOB. Upon arrival to pt's room asked pt about chest pain. Pt is complaining of having chest pain 5/10. NTG 0.4 mg sublingual given to pt as ordered x1. Pt current VS T 97.2, BP 175/68, HR 69, RR 20 O2 sat 100 2L [place].   2209-Remained with pt and now pt states pain is 0/10. Will continue to monitor pt closely for pain and SOB. Pt current BP 137/52 HR 55-56, O2 sat 100 2L [place] RR 18

## 2020-08-07 NOTE — Consult Note (Addendum)
Cardiology Consultation:   Patient ID: Paul [name] MRN: [id number]; DOB: Jul 01, 1940  Admit date: 08/07/2020 Date of Consult: 08/07/2020  PCP:  Paul Lung, [name]   [facility] Providers Cardiologist:  Paul [name]   {  Patient Profile:   Paul [name] is a 80 y.o. male with a history of CAD with known CTO of RCA with moderate disease of LAD and LCX on cardiac catheterization in 2010 and recent low risk Myoview in 07/2020, bilateral carotid artery disease, AAA, pulmonary fibrosis with continued tobacco use, hypertension, hyperlipidemia, diabetes mellitus, prior DVT, myelodysplastic disease, anemia of chronic disease, GERD, and chronic back pain who is being seen for the evaluation of chest pain at the request of Paul [name].  History of Present Illness:   Paul [name] is a 80 year old male with the above history who is followed by Paul [name]. Patient has known CAD. Cardiac catheterization in 2010 showed CTO of RCA not amenable to PCI with moderate disease of the LAD and LCX. He has had recurrent angina since then. Repeat cardiac catheterization not performed due to anemia secondary to myelodysplastic disease. However, chest pain improved with addition of Imdur. Patient was seen in 06/2020 with worsening dyspnea. Recent Echo prior to this visit showed LVEF of 55% with mild AS/moderate AI, and mild MR. Myoview was ordered for further evaluation and was low risk with no evidence of ischemia. Patient was seen by Paul [name] for follow-up on 07/28/2020 at which time he was having ongoing dyspnea but otherwise stable from a cardiac standpoint. Dyspnea was felt to be multifactorial with severe anemia playing a large role. 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 Chest CT in 07/2020 showed basilar predominant fibrotic interstitial [name] disease. Patient was referred to Pulmonology and was  seen by Dr. Erin [name] on 08/06/2020 and was started on Prednisone taper as well as Breztri inhaler. PFTs were also ordered.  Patient presented to the ED today for further evaluation of chest pain. Patient states he woke up this morning and started having tightness across his chest "from shoulder to shoulder" around 7-8am. Patient's sister states he was complaining of pain in between his shoulder blades with this but patient denies any radiation of pain. He took a does of sublingual Nitro and pain completely resolved for about 20-30 minutes. He took 4 doses of subglingual Nitro with resolution of pain for about 20 minutes each time. Last dose of Nitro was around 9:00am but he has continued to have intermittent chest tightness throughout the day. He has worsening shortness of breath with this but no other associated symptoms. No diaphoresis, nausea, or vomiting. Pain is worse when laying down. No other recent chest pain. He is not very active at all due to severe dyspnea so unclear if worse with exertion. He has struggled with dyspnea both at rest and with exertion for the last 6-8 months. He states his breathing is so bad he has to rest for 10 minutes after walking short distances in his house. He describes orthopnea/PND as well as lower extremity edema for the last 3-4 days. His sister states his legs were "huge" yesterday. He has a chronic productive cough in the morning. No fevers or illnesses. No abnormal bleeding - no hematochezia, melena, hematuria, or hemoptysis.  In the ED, patient tachypneic but otherwise vitals stable. Initial EKG showed sinus rhythm with ST depression in lead II and  V5-V6. However, on repeat EKG these ST change resolved. Initial high-sensitivity troponin negative. BNP elevated at 634. Chest x-ray showed stable cardiomegaly with chronic interstitial changes with superimposed active interstitial process as well as small bilateral pleural effusions. WBC 71.8, Hgb 9.4, Plts 221. Na 128, K 5.3,  Glucose 274, BUN 33, Cr 1.52. Albumin 2.7, AST 24, ALT 19, Alk Phos 91, Total Bili 1.1. Respiratory panel negative for COVID and influenza.  At the time of this evaluation, patient chest pain free.   Past Medical History:  Diagnosis Date   AAA (abdominal aortic aneurysm) ([name])    Abnormal LFTs    AKI (acute kidney injury) ([place])    Anemia of chronic disease    Arthritis    "all over" (12/04/2015)   Atherosclerosis of native coronary artery of native heart without angina pectoris    CAD (coronary artery disease)    with cath 09/26/08 showing severe heart disease in RCA with POBA, mild to mod diesease in circ and LAD   Chronic back pain    back problems prior to surgery    Constipation    Coronary atherosclerosis of native coronary artery    Dehydration    Diabetes mellitus    with neuropathy   Diarrhea    Elevated liver enzymes    Generalized weakness    Gout of right hand    Hiatal hernia    History of gastroesophageal reflux (GERD)    History of stomach ulcers    Hyperlipidemia    Hypertension    Metabolic acidosis    Normochromic anemia 02/23/2016   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   Thrombocytopenia ([place])    Unintentional weight loss 09/27/2017   33 lbs between 4/19 & 8/19   Vitamin B12 deficiency    Vitamin B12 deficiency    Vitamin D deficiency    Weakness     Past Surgical History:  Procedure Laterality Date   ANKLE FRACTURE SURGERY Left    "crushed"   BACK SURGERY  X 3   "fell 4 stories in 1977"   [place] WITH STENT PLACEMENT  09/26/2008   [name] 06/03/2010   FRACTURE SURGERY     LEFT HEART CATHETERIZATION WITH CORONARY ANGIOGRAM N/A 01/21/2012   Procedure: LEFT HEART CATHETERIZATION WITH CORONARY ANGIOGRAM;  Surgeon: Burnell Blanks, [name];  Location: [facility] CATH LAB;  Service: Cardiovascular;  Laterality: N/A;   [place]; ~ [place]  ~ 1998   "3rd OR to reconstruct my back after  fall in 1977"   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 and 2nd     Home Medications:  Prior to Admission medications   Medication Sig Start Date End Date Taking? Authorizing Provider  amLODipine (NORVASC) 5 MG tablet TAKE 1 TABLET BY MOUTH  DAILY 01/24/20   Burnell Blanks, [name]  aspirin 81 MG chewable tablet Chew 81 mg by mouth daily.    Provider, Historical, [name]  Budeson-Glycopyrrol-Formoterol (BREZTRI AEROSPHERE) 160-9-4.8 MCG/ACT AERO Inhale 2 puffs into the lungs in the morning and at bedtime. 08/06/20   Freddi Starr, [name]  Budeson-Glycopyrrol-Formoterol (BREZTRI AEROSPHERE) 160-9-4.8 MCG/ACT AERO Inhale 2 puffs into the lungs in the morning and at bedtime. 08/06/20   Freddi Starr, [name]  clopidogrel (PLAVIX) 75 MG tablet TAKE 1 TABLET BY MOUTH  DAILY 01/18/20   Paul Lung, [name]  ENBREL SURECLICK 50 MG/ML injection Inject 50 mg into the skin once  a week.  04/26/19   Provider, Historical, [name]  febuxostat (ULORIC) 40 MG tablet Take 40 mg by mouth daily.    Provider, Historical, [name]  fish oil-omega-3 fatty acids 1000 MG capsule Take 1 g by mouth 2 (two) times daily.     Provider, Historical, [name]  isosorbide mononitrate (IMDUR) 60 MG 24 hr tablet Take 1 tablet (60 mg total) by mouth at bedtime. 06/27/20   Burnell Blanks, [name]  leflunomide (ARAVA) 20 MG tablet Take 20 mg by mouth daily. 05/21/19   Provider, Historical, [name]  metoprolol tartrate (LOPRESSOR) 25 MG tablet TAKE 1 TABLET BY MOUTH  TWICE DAILY 07/07/20   Burnell Blanks, [name]  Multiple Vitamins-Minerals (CENTRUM) tablet Take 1 tablet by mouth daily.    Provider, Historical, [name]  nitroGLYCERIN (NITROSTAT) 0.4 MG SL tablet Place 0.4 mg under the tongue every 5 (five) minutes as needed for chest pain. For 3 doses 10/12/11   Paul Lung, [name]  predniSONE (DELTASONE) 10 MG tablet Take 1 tablet (10 mg total) by mouth daily with breakfast. 08/06/20   Freddi Starr, [name]  pyridoxine (B-6) 100 MG tablet Take 100 mg by mouth daily.     Provider, Historical, [name]  simvastatin (ZOCOR) 20 MG tablet TAKE 1 TABLET BY MOUTH AT  BEDTIME 01/24/20   Burnell Blanks, [name]    Inpatient Medications: Scheduled Meds:  aspirin  325 mg Oral Daily   enoxaparin (LOVENOX) injection  40 mg Subcutaneous Q24H   [START ON 08/08/2020] insulin aspart  0-9 Units Subcutaneous TID WC   nicotine  14 mg Transdermal Daily   sodium chloride flush  3 mL Intravenous Q12H   Continuous Infusions:  sodium chloride     PRN Meds:   Allergies:    Allergies  Allergen Reactions   Allopurinol Other (See Comments)    thrombocytopenia   Metformin And Related Diarrhea    Social History:   Social History   Socioeconomic History   Marital status: Married    Spouse name: Scientist, physiological   Number of children: 0   Years of education: Not on file   Highest education level: Not on file  Occupational History   Not on file  Tobacco Use   Smoking status: Every Day    Packs/day: 0.50    Years: 20.00    Pack years: 10.00    Types: Cigars, Cigarettes    Start date: 1961   Smokeless tobacco: Never   Tobacco comments:    Started smoking at age 32, quit at age 78, then restarted at age 12  Vaping Use   Vaping Use: Never used  Substance and Sexual Activity   Alcohol use: Not Currently   Drug use: No   Sexual activity: Not on file  Other Topics Concern   Not on file  Social History Narrative   Divorced, 1 child.    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    Owned roofing co. In [place]   Social Determinants of Health   Financial Resource Strain: Not on file  Food Insecurity: Not on file  Transportation Needs: Not on file  Physical Activity: Not on file  Stress: Not on file  Social Connections: Not on file  Intimate Partner Violence: Not on file    Family History:    Family History  Problem Relation Age of Onset   Heart attack Mother    Cancer Mother    Heart disease Mother    Heart attack Sister  Heart disease  Sister      ROS:  Please see the history of present illness.  Review of Systems  Constitutional:  Positive for weight loss. Negative for fever.  HENT:  Negative for congestion.   Respiratory:  Positive for cough, sputum production and shortness of breath. Negative for hemoptysis.   Cardiovascular:  Positive for chest pain, orthopnea, leg swelling and PND. Negative for palpitations.  Gastrointestinal:  Negative for blood in stool, melena, nausea and vomiting.  Genitourinary:  Negative for hematuria.  Musculoskeletal:  Negative for myalgias and neck pain.  Neurological:  Negative for dizziness and loss of consciousness.  Endo/Heme/Allergies:  Does not bruise/bleed easily.  Psychiatric/Behavioral:  Positive for substance abuse (tobacco abuse).     Physical Exam/Data:   Vitals:   08/07/20 1345 08/07/20 1445 08/07/20 1500 08/07/20 1530  BP: (!) 147/50 (!) 135/42 (!) 145/46 (!) 132/47  Pulse: 69 72 64 (!) 59  Resp: (!) 32 (!) '27 18 19  ' Temp:      TempSrc:      SpO2: 100% 100% 100% 100%  Weight:      Height:       No intake or output data in the 24 hours ending 08/07/20 1744 Last 3 Weights 08/07/2020 08/06/2020 07/28/2020  Weight (lbs) 158 lb 158 lb 161 lb 12.8 oz  Weight (kg) 71.668 kg 71.668 kg 73.392 kg     Body mass index is 22.67 kg/m.  General: 80 y.o. thin Caucasian male resting comfortably in no acute distress.  HEENT: Normocephalic and atraumatic. Sclera clear.  Neck: Supple. Right carotid bruit. No JVD. Heart: Bradycardic with normal rhythm. Distinct S1 and S2. II-III/VI murmur present. No rubs or gallops. Radial pulses 2+ and equal bilaterally. Lungs: No increased work of breathing. Course crackles in bilateral bases (right > left). Abdomen: Soft, non-distended, and non-tender to palpation.  MSK: Normal strength and tone for age. Extremities: Trace to 1+ pitting edema of bilateral lower extremities. Skin: Warm and dry. Neuro: Alert and oriented x3. No focal  deficits. Psych: Normal affect. Responds appropriately.  EKG:  The EKG was personally reviewed and demonstrates:  - First EKG showed: Normal sinus rhythm, rate 80 bpm, with mild 1st degree AV block, a dropped beat, and ST depression in leads II as well as V5-V6. - Second EKG showed: Normal sinus rhythm, rate 62 bpm, with LHV. Biphasic T wave in lead II but ST depressions on prior tracings resolved.  Telemetry:  Telemetry was personally reviewed and demonstrates:  Sinus rhythm with rates in the 40s to 60s and PACs. Occasional missed beats.   Relevant CV Studies:  Echocardiogram 06/23/2020: Impressions: 1. Left ventricular ejection fraction, by estimation, is 55%. The left  ventricle has normal function. The left ventricle has no regional wall  motion abnormalities. The left ventricular internal cavity size was mildly  dilated. There is mild left  ventricular hypertrophy. Left ventricular diastolic parameters were  normal.   2. Right ventricular systolic function is normal. The right ventricular  size is normal.   3. Left atrial size was moderately dilated.   4. The mitral valve is normal in structure. Mild mitral valve  regurgitation. No evidence of mitral stenosis. Moderate mitral annular  calcification.   5. Degree of AR similar to that seen on TTE 12/02/19 . The aortic valve  is normal in structure. There is moderate calcification of the aortic  valve. There is moderate thickening of the aortic valve. Aortic valve  regurgitation is moderate. Mild aortic  valve stenosis.   6. The inferior vena cava is dilated in size with >50% respiratory  variability, suggesting right atrial pressure of 8 mmHg. _______________  Myoview 07/14/2020: The left ventricular ejection fraction is normal (55-65%). Nuclear stress EF: 61%. There was no ST segment deviation noted during stress. There is a small defect of moderate severity present in the basal inferior, mid inferior and apical inferior  location. The defect is non-reversible. In the setting of normal LVF, this is most consistent with diaphragmatic attenuation artifact. No ischemia noted. This is a low risk study.  Laboratory Data:  High Sensitivity Troponin:   Recent Labs  Lab 08/07/20 1301 08/07/20 1458  TROPONINIHS 9 11     Chemistry Recent Labs  Lab 08/07/20 1301  NA 128*  K 5.3*  CL 102  CO2 20*  GLUCOSE 274*  BUN 33*  CREATININE 1.52*  CALCIUM 8.6*  GFRNONAA 47*  ANIONGAP 6    Recent Labs  Lab 08/07/20 1301  PROT 7.8  ALBUMIN 2.7*  AST 24  ALT 19  ALKPHOS 91  BILITOT 1.1   Hematology Recent Labs  Lab 08/07/20 1301  WBC 71.8*  RBC 3.43*  HGB 9.4*  HCT 28.6*  MCV 83.4  MCH 27.4  MCHC 32.9  RDW 21.1*  PLT 221   BNP Recent Labs  Lab 08/07/20 1301  BNP 634.6*    DDimer No results for input(s): DDIMER in the last 168 hours.   Radiology/Studies:  DG Chest 2 View  Result Date: 08/07/2020 CLINICAL DATA:  Shortness of breath. EXAM: CHEST - 2 VIEW COMPARISON:  CT 07/14/2020.  Chest x-ray 12/01/2019. FINDINGS: Stable mediastinal prominence consistent with mediastinal fat again noted. Stable cardiomegaly. No pulmonary venous congestion. Chronic interstitial changes are again noted. Superimposed active interstitial process including interstitial edema and or pneumonitis cannot be excluded. Small bilateral pleural effusions are noted. Stable right apical pleural thickening consistent scarring. No pneumothorax. Degenerative change thoracic spine. IMPRESSION: 1.  Stable cardiomegaly.  No pulmonary venous congestion. 2. Chronic interstitial changes are again noted. Superimposed active interstitial process including interstitial edema and or pneumonitis cannot be excluded. Small bilateral pleural effusions are noted. Electronically Signed   By: Marcello Moores  Register   On: 08/07/2020 13:19     Assessment and Plan:   Chest Pain History of CAD - Patient presents with intermittent chest tightness at  rest that resolved with Nitro. Worse when laying down. - Last cardiac catheterization in 2010 showed CTO of RCA and moderate disease in LAD and LCX. Myoview in 07/2020 low risk with no evidence of ischemia. - Initial EKG showed ST depression in lead II and V5-V6 but resolved on repeat tracing. - Initial high-sensitivity troponin negative x2. - Recent Echo in 06/2020 showed LV function and wall motion. - Currently chest pain free. - Continue DAPT with Aspirin and Plavix.  - Continue Lopressor 37m twice daily.  - Continue Amlodipine 577mdaily. - Increase Imdur to 9079maily. - Continue Simvastatin 75m4mily. - Chest tightness may be due to mild volume overload in setting of known CAD and interstitial [name] disease. Will diuresis patient as below to see if that help and can reevaluate in the morning.  Acute on Chronic Diastolic CHF - Patient has been having severe dyspnea at rest and with exertion for the past several months. Felt to primarily be due to interstitial [name] disease.  - BNP elevated at 634. - Chest x-ray shows chest x-ray showed stable cardiomegaly with chronic interstitial changes with superimposed active interstitial  process (possible interstitial edema or pneumonitis) as well as small bilateral pleural effusions. - Will give one dose of IV Lasix 86m daily to see if that helps. - Monitor daily weights, strict I/Os, and renal function.  Interstitial [name] Disease - Management per primary team.  Hypertension - BP elevated. - Continue Amlodipine 575mdaily. - Continue Lopressor 2530mwice daily. - Increase Imdur to 15m21mily.  Hyperlipidemia - LDL 35 in 11/2019. - Continue Simvastatin 20mg20mly.  Type 2 Diabetes Mellitus - Management per primary team.  CKD Stage III - Creatinine 1.51 on admission. Baseline 1.2 to 1.7. - Monitor with diuresis.  Hyperkalemia - Potassium 5.3 on admission.  - Giving a dose of Lasix as above.  - Will recheck in the  morning.  Dilatation of Ascending Thoracic Aorta - 4.1cm ascending thoracic aorta noted on recent chest CT on 07/14/2020. - Annual imaging with CTA or MRA recommended.  AAA - Abdominal ultrasound in 06/2019 showed 4.1cm infrarenal AAA (mildly enlarged since 2017). - PCP following as outpatient.  Leukocytosis Chronic Anemia Myelodysplastic Disease - Patient has history of myelodysplastic disease.  - WBC 71.8 on admission (up from 42.3 on 07/31/2020). - Hemoglobin stable at 9.4.  - Management per primary team.  Tobacco Abuse - Patient has been counseled on the importance of complete cessation in the past and has not been interested in quitting.  Otherwise, per primary team.   Risk Assessment/Risk Scores:   HEAR Score (for undifferentiated chest pain):  HEAR Score: 6{  New York Heart Association (NYHA) Functional Class NYHA Class IV. Patient's interstitial [name] disease playing a large role in this.   For questions or updates, please contact [facility] consult [email or web address] for contact info under    Signed, CalliEppie Gibson/2022 5:44 PM  Patient seen and examined and agree with CalliSande Rivesas detailed above.  In brief, the patient is a  80 y.33 male with a history of CAD with known CTO of RCA with moderate disease of LAD and LCX on cardiac catheterization in 2010 and recent low risk Myoview in 07/2020, bilateral carotid artery disease, AAA, pulmonary fibrosis with continued tobacco use, hypertension, hyperlipidemia, diabetes mellitus, prior DVT, myelodysplastic disease, anemia of chronic disease, GERD, and chronic back pain who presented to the ER with chest tightness for which Cardiology has been consulted.   Patient has known history of CAD with last cath in 2010 demonstrating CTO of RCA and moderate disease in LAD and LCX. Recent myoview 07/2020 without evidence of ischemia and normal LVEF. Also with complex history of myelodysplastic disease and  pulmonary fibrosis. He had responded well to imdur in the past and recently saw Pulm for his pulmonary fibrosis started on prednisone and breztri.   Today, he presented to the ER with chest tightness radiating from shoulder to shoulder. Pain improved with nitro but then recurred. Symptoms were also notably worse when laying flat and improved with sitting up. Labs notable for normal troponin x2. BNP elevated at 634. Chest x-ray showed stable cardiomegaly with chronic interstitial changes with superimposed active interstitial process as well as small bilateral pleural effusions. Initial EKG showed ST depression in lead II and V5-V6 but resolved on repeat tracing. WBC also notably elevated on 71.  Overall complex clinical picture, however, do not suspect ACS. Trop reassuringly normal. Initial ECG with STD that resolved on subsequent tracing (?vasospasm). BNP also elevated with orthopnea and small bilateral effusions on CXR concerning for acute on chronic diastolic heart  failure is contributing. Was recently started on inhalers with beta agonist which may lead to anignal symptoms, however, his symptoms developed the morning after he used the inhaler which would be a very delayed reaction. WBC also signfiicantly elevated raising concern for malignant transformation (? Leukemia). Recommend diuresis, increasing imdur and working up significantly elevated WBC per primary team.  Exam: GEN: Elderly male, chronically ill appearing, comfortable   Neck: Mildly elevated JVD Cardiac: Bradycardic, regular, 2/6 harsh systolic murmur Respiratory: Course crackles bilaterally with rhonchi especially on right GI: Soft, nontender, non-distended  MS: Trace to 1+ ankle edema, warm Neuro:  Nonfocal  Psych: Normal affect    Plan: -Low suspicion for ACS at this time and patient poor candidate for invasive procedures given myelodysplastic syndrome and chronic anemia -Start diuresis with lasix 62m IV and monitor  response -Increase imdur to 974mdaily  -Can consider ranexa as well pending renal function -Continue ASA 8124maily, plavix 30m45mily -Continue lopressor 25mg54m -Continue amlodipine 5mg d4my -WBC significantly elevated at 71 which seems out of proportion to leukocytosis seen with steroids and is significantly higher than on previous labs; agee with hematology assessment  -Suspect pulmonary fibrosis is primary driver of chronic dyspnea on exertion that is mildly worsened currently due to volume overload--will diurese as above   HeatheGwyndolyn Kaufman

## 2020-08-07 NOTE — H&P (Signed)
History and Physical    [name] YBW:[id number] DOB: [birth year] DOA: 08/07/2020  PCP: Paul Lung, MD Consultants:  cardiology: Dr. Angelena [name], oncology: Dr. Burr [name], pulmonology: Dr. Erin [name], rheumatologist: Dr. Veleta [name]  Patient coming from:  Home - lives with wife ; NOK: sister in room   Chief Complaint: chest pain and shortness of breath   HPI: Paul [name] is a 80 y.o. male with medical history significant of MDS/MPN,  CAD, DM2, bilateral carotid artery disease, HTN, HLD, ACD and hx of DVT, pulmonary fibrosis, RA, tobacco abuse, AAA, CKD who presented to ED with chest pain that started this Am around 7:00am. He has pain across his entire anterior chest wall. Does not radiate down arm or up his jaw. States it hurts. Pain will last until he sits up or takes his NG and it helps for a 15-+59minutes. Tends to be worse with lying down. He has associated shortness of breath and feels like he is "panting like a dog." He felt this short of breath yesterday at the pulmonologist. Denies any diaphoresis or reproducible pain. He does state his legs have been swelling for the past 2 days. He has not gained any weight. He has a chronic cough in the AM with production that has not changed in the past 6 months. No fever/chills, no sick contacts. Denies any nausea, vomiting or diarrhea. No abdominal pain. Denies any palpitations, syncope, dizziness. He can not walk to the bathroom due to severe shortness of breath.     ED Course: vitals: bp: 178/56, HR: 75, afebrile, RR: 30, oxygen 100% on 2L. Significant labs:  WBC: 71.8, sodium: 128, creatinine around baseline: 1.52, bnp of 634. Ekg at baseline and initial troponin wnl. Given 500 IVF bolus, duoneb, solu medrol. Cardiology consulted in ER. Asked to admit for remarkable leukocytosis, shortness of breath and chest pain.   Review of Systems: As per HPI; otherwise review of systems reviewed and negative.   Ambulatory Status:  Ambulates without  assistance  COVID Vaccine Status:  pfizer x 2   Past Medical History:  Diagnosis Date   AAA (abdominal aortic aneurysm) ([name])    Abnormal LFTs    AKI (acute kidney injury) ([place])    Anemia of chronic disease    Arthritis    "all over" (12/04/2015)   Atherosclerosis of native coronary artery of native heart without angina pectoris    CAD (coronary artery disease)    with cath 09/26/08 showing severe heart disease in RCA with POBA, mild to mod diesease in circ and LAD   Chronic back pain    back problems prior to surgery    Constipation    Coronary atherosclerosis of native coronary artery    Dehydration    Diabetes mellitus    with neuropathy   Diarrhea    Elevated liver enzymes    Generalized weakness    Gout of right hand    Hiatal hernia    History of gastroesophageal reflux (GERD)    History of stomach ulcers    Hyperlipidemia    Hypertension    Metabolic acidosis    Normochromic anemia 02/23/2016   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   Thrombocytopenia ([place])    Unintentional weight loss 09/27/2017   33 lbs between 4/19 & 8/19   Vitamin B12 deficiency    Vitamin B12 deficiency    Vitamin D deficiency    Weakness     Past Surgical History:  Procedure Laterality Date   ANKLE FRACTURE SURGERY Left    "  crushed"   BACK SURGERY  X 3   "fell 4 stories in 1977"   [place] Left    CORONARY ANGIOPLASTY WITH STENT PLACEMENT  09/26/2008   [name] 06/03/2010   FRACTURE SURGERY     LEFT HEART CATHETERIZATION WITH CORONARY ANGIOGRAM N/A 01/21/2012   Procedure: LEFT HEART CATHETERIZATION WITH CORONARY ANGIOGRAM;  Surgeon: Burnell Blanks, MD;  Location: [facility] CATH LAB;  Service: Cardiovascular;  Laterality: N/A;   [place]; ~ [place]  ~ 1998   "3rd OR to reconstruct my back after fall in 1977"   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 and 2nd    Social History   Socioeconomic History   Marital status: Married    Spouse name:  Scientist, physiological   Number of children: 0   Years of education: Not on file   Highest education level: Not on file  Occupational History   Not on file  Tobacco Use   Smoking status: Every Day    Packs/day: 0.50    Years: 20.00    Pack years: 10.00    Types: Cigars, Cigarettes    Start date: 1961   Smokeless tobacco: Never   Tobacco comments:    Started smoking at age 71, quit at age 67, then restarted at age 47  Vaping Use   Vaping Use: Never used  Substance and Sexual Activity   Alcohol use: Not Currently   Drug use: No   Sexual activity: Not on file  Other Topics Concern   Not on file  Social History Narrative   Divorced, 1 child.    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    Owned roofing co. In [place]   Social Determinants of Health   Financial Resource Strain: Not on file  Food Insecurity: Not on file  Transportation Needs: Not on file  Physical Activity: Not on file  Stress: Not on file  Social Connections: Not on file  Intimate Partner Violence: Not on file    Allergies  Allergen Reactions   Allopurinol Other (See Comments)    thrombocytopenia   Metformin And Related Diarrhea    Family History  Problem Relation Age of Onset   Heart attack Mother    Cancer Mother    Heart disease Mother    Heart attack Sister    Heart disease Sister     Prior to Admission medications   Medication Sig Start Date End Date Taking? Authorizing Provider  amLODipine (NORVASC) 5 MG tablet TAKE 1 TABLET BY MOUTH  DAILY 01/24/20   Burnell Blanks, MD  aspirin 81 MG chewable tablet Chew 81 mg by mouth daily.    [provider]  Budeson-Glycopyrrol-Formoterol (BREZTRI AEROSPHERE) 160-9-4.8 MCG/ACT AERO Inhale 2 puffs into the lungs in the morning and at bedtime. 08/06/20   Freddi Starr, MD  Budeson-Glycopyrrol-Formoterol (BREZTRI AEROSPHERE) 160-9-4.8 MCG/ACT AERO Inhale 2 puffs into the lungs in the morning and at bedtime. 08/06/20   Freddi Starr, MD  clopidogrel (PLAVIX) 75 MG tablet TAKE 1 TABLET BY MOUTH  DAILY 01/18/20   Paul Lung, MD  ENBREL SURECLICK 50 MG/ML injection Inject 50 mg into the skin once a week.  04/26/19   [provider]  febuxostat (ULORIC) 40 MG tablet Take 40 mg by mouth daily.    [provider]  fish oil-omega-3 fatty acids 1000 MG capsule Take 1 g by mouth 2 (two) times  daily.     [provider]  isosorbide mononitrate (IMDUR) 60 MG 24 hr tablet Take 1 tablet (60 mg total) by mouth at bedtime. 06/27/20   Burnell Blanks, MD  leflunomide (ARAVA) 20 MG tablet Take 20 mg by mouth daily. 05/21/19   [provider]  metoprolol tartrate (LOPRESSOR) 25 MG tablet TAKE 1 TABLET BY MOUTH  TWICE DAILY 07/07/20   Burnell Blanks, MD  Multiple Vitamins-Minerals (CENTRUM) tablet Take 1 tablet by mouth daily.    [provider]  nitroGLYCERIN (NITROSTAT) 0.4 MG SL tablet Place 0.4 mg under the tongue every 5 (five) minutes as needed for chest pain. For 3 doses 10/12/11   Paul Lung, MD  predniSONE (DELTASONE) 10 MG tablet Take 1 tablet (10 mg total) by mouth daily with breakfast. 08/06/20   Freddi Starr, MD  pyridoxine (B-6) 100 MG tablet Take 100 mg by mouth daily.    [provider]  simvastatin (ZOCOR) 20 MG tablet TAKE 1 TABLET BY MOUTH AT  BEDTIME 01/24/20   Burnell Blanks, MD    Physical Exam: Vitals:   08/07/20 1345 08/07/20 1445 08/07/20 1500 08/07/20 1530  BP: (!) 147/50 (!) 135/42 (!) 145/46 (!) 132/47  Pulse: 69 72 64 (!) 59  Resp: (!) 32 (!) 27 18 19   Temp:      TempSrc:      SpO2: 100% 100% 100% 100%  Weight:      Height:         General:  Appears calm and comfortable and is in NAD. Sitting up in bed.  Eyes:  PERRL, EOMI, normal lids, iris ENT:  grossly normal hearing, lips & tongue, mmm; appropriate dentition-dentures upper and lower.  Neck:  no LAD, masses or thyromegaly; no carotid  bruits Cardiovascular:  RRR, no m/r/g. Pitting edema to ankles bilaterally   Respiratory:   [name] bases with velcro like sounds. No wheezing  mild belly breathing  Abdomen:  soft, NT, ND, NABS Back:   normal alignment, no CVAT Skin:  no rash or induration seen on limited exam Musculoskeletal:  grossly normal tone BUE/BLE, good ROM, no bony abnormality Lower extremity:  Limited foot exam with no ulcerations.  thready distal pulses. Toe amputation of right great halux Psychiatric:  grossly normal mood and affect, speech fluent and appropriate, AOx3 Neurologic:  CN 2-12 grossly intact, moves all extremities in coordinated fashion, sensation intact    Radiological Exams on Admission: Independently reviewed - see discussion in A/P where applicable  DG Chest 2 View  Result Date: 08/07/2020 CLINICAL DATA:  Shortness of breath. EXAM: CHEST - 2 VIEW COMPARISON:  CT 07/14/2020.  Chest x-ray 12/01/2019. FINDINGS: Stable mediastinal prominence consistent with mediastinal fat again noted. Stable cardiomegaly. No pulmonary venous congestion. Chronic interstitial changes are again noted. Superimposed active interstitial process including interstitial edema and or pneumonitis cannot be excluded. Small bilateral pleural effusions are noted. Stable right apical pleural thickening consistent scarring. No pneumothorax. Degenerative change thoracic spine. IMPRESSION: 1.  Stable cardiomegaly.  No pulmonary venous congestion. 2. Chronic interstitial changes are again noted. Superimposed active interstitial process including interstitial edema and or pneumonitis cannot be excluded. Small bilateral pleural effusions are noted. Electronically Signed   By: Marcello Moores  Register   On: 08/07/2020 13:19    EKG: Independently reviewed.  NSR with rate 62, LVH; nonspecific ST changes with no evidence of acute ischemia  Labs on Admission: I have personally reviewed the available labs and imaging studies at the  time of the  admission.  Pertinent labs:  Bnp: 634 Wbc: 71.8 Sodium: 128 Potassium: 5.3 Creatinine: 1.52  (fluctuates 1.3-1.57) BUN: 33 CXR: chronic interstitial changes noted. Small bilateral pleural effusions  Assessment/Plan Principal Problem:   Chest pain -cardiology consulted and following. Known CTO of RCA with moderate disease of his LAD and LCX on cath in 2010.  -serial troponin negative and ekg with no St elevation.  -place on tele and continue NG -continue ASA/plavix/statin and beta blocker  -f/u on cardiology recommendation   Active Problems:   Shortness of breath Differential includes cardiac vs. Known pulmonary fibrosis vs. Anemia -cardiology following. Serial troponins negative.  -continue tele -BNP elevated with leg swelling. Recent echo 06/2020 with EF of 55%. Left diastolic parameters were wnl.  -diuresing him with 40mg  IV lasix x 1 dose per cardiology  -ACD stable.  -low probability for PE with no tachycardia, no calf swelling    MDS/MPN (myelodysplastic/myeloproliferative neoplasms) ([name]) -hematology, Dr. Lorenso Courier consulted with markedly elevated WBC. Concern for possible malignancy.  -peripheral smear pending -continue with daily cbc with diff -did start prednisone 40mg  yesterday  Leukocytosis See above     Pulmonary fibrosis ([place]) Has progressed. Saw pulmonolgy yesterday and SOB unchanged from that time. PFTs have been ordered outpatient -unsure if severity of his fibrosis is driving factor in his dyspnea -continue his breztri that pulm gave him  -also placed on steroids per pulmonology yesterday and will continue his dosage: 40mg  x 10 days, 20mg  x 10 days then 10mg  daily until f/u.     CAD (coronary artery disease) -CTO of RCA -moderate disease of LAD and LCX on cardiac cath in 2010 -recent stress test in 07/2020 that was negative for ischemia.  -continue medical management with statin, ASA, plavix and beta blocker     Type 2 diabetes mellitus with diabetic  neuropathy, without long-term current use of insulin ([name]) -diet controlled -last A1c was 4.7, repeat pending -SSI, accuchecks, but appears to be very tightly controlled off medicaiton    Hyponatremia -around baseline -500L bolus in ER. Encouraged oral hydration -do not want to overload him -repeat in AM     CKD (chronic kidney disease), stage III ([place]) -in his baseline -follow     Anemia of chronic disease -followed by hematology, likely secondary to MDS -stable -receives retacrit infusions q 2 weeks.     Hyperlipidemia associated with type 2 diabetes mellitus ([place]) -continue statin -lipid panel to goal 10/21. LDL 35    Current smoker  -encouraged to stop, nicotine patch.   Dilatation of Ascending Thoracic Aorta - 4.1cm ascending thoracic aorta noted on recent chest CT on 07/14/2020. - Annual imaging with CTA or MRA recommended.   AAA - Abdominal ultrasound in 06/2019 showed 4.1cm infrarenal AAA (mildly enlarged since 2017). - PCP following as outpatient.  RA -on enbrel,   Hyperkalemia -lasix x 1 IV dose, repeat bmp in AM.    Body mass index is 22.67 kg/m.     Level of care: Telemetry Cardiac DVT prophylaxis:  Lovenox  Code Status:  Full - confirmed with patient Family Communication: frances Murtagh, sister in room.  Disposition Plan:  The patient is from: home  Anticipated d/c is to: home    Patient is currently: acutely ill and requires inpatient treatment.   Consults called: cardiology called from ED.  Hematology consulted: Dr. Lorenso Courier  Admission status:  inpatient    Orma Flaming MD [facility]   How to contact the [facility] Attending or Consulting  provider [place] or covering provider during after hours [place], for this patient?  Check the care team in [facility] and look for a) attending/consulting [facility] provider listed and b) the [facility] team listed Log into [email or web address] and use [place]'s universal password to access. If you do not have the password,  please contact the hospital operator. Locate the [facility] provider you are looking for under [facility] and page to a number that you can be directly reached. If you still have difficulty reaching the provider, please page the [facility] (Director on Call) for the Hospitalists listed on amion for assistance.   08/07/2020, 4:55 PM

## 2020-08-07 NOTE — ED Triage Notes (Signed)
To ED from home for eval of chest pain that started last night. Pt also reports SOB. Ttook Ntg at home with relief.

## 2020-08-07 NOTE — ED Notes (Signed)
Notified Dr. Eulis Foster of WBC 71.8 called by lab. No new orders received.

## 2020-08-07 NOTE — ED Provider Notes (Signed)
[facility] EMERGENCY DEPARTMENT Provider Note   CSN: [id number] Arrival date & time: 08/07/20  1137     History Chief Complaint  Patient presents with   Chest Pain    Chest pain , SOB    SLATE DEBROUX is a 80 y.o. male.  HPI  Patient presents with chest pain since last night.  Pain moves from shoulder to shoulder, unable to Characterize how it feels, last for about 5 to 10 minutes.  It is relieved with nitroglycerin. He took 4 in 2 hours. He has been feeling short of breath for the last few weeks.  Seen by pulmonology yesterday and given 40 mg of prednisone and an albuterol inhaler.  Taking all the medicine, albuterol inhaler last use at 8 AM this morning without relief.  He has been feeling increasingly weak the last few weeks.  He has not been wanting to eat due to disinterest.    He is not typically on oxygen at home.  He is on 2 L here in the ED.  Patient has history of AAA, MI, CAD, hypertension, hyperlipidemia.  He is on Eliquis.  He has not missed any medication doses.  Has myelodysplastic proliferation.  Has weekly infusions of EPO.  Past Medical History:  Diagnosis Date   AAA (abdominal aortic aneurysm) ([name])    Abnormal LFTs    AKI (acute kidney injury) ([place])    Anemia of chronic disease    Arthritis    "all over" (12/04/2015)   Atherosclerosis of native coronary artery of native heart without angina pectoris    CAD (coronary artery disease)    with cath 09/26/08 showing severe heart disease in RCA with POBA, mild to mod diesease in circ and LAD   Chronic back pain    back problems prior to surgery    Constipation    Coronary atherosclerosis of native coronary artery    Dehydration    Diabetes mellitus    with neuropathy   Diarrhea    Elevated liver enzymes    Generalized weakness    Gout of right hand    Hiatal hernia    History of gastroesophageal reflux (GERD)    History of stomach ulcers    Hyperlipidemia    Hypertension     Metabolic acidosis    Normochromic anemia 02/23/2016   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   Thrombocytopenia ([place])    Unintentional weight loss 09/27/2017   33 lbs between 4/19 & 8/19   Vitamin B12 deficiency    Vitamin B12 deficiency    Vitamin D deficiency    Weakness     Patient Active Problem List   Diagnosis Date Noted   Chronic low back pain without sciatica 12/10/2019   Rheumatoid arthritis ([place]) 12/10/2019   Pulmonary fibrosis ([place]) 12/10/2019   Aortic atherosclerosis ([place]) 12/10/2019   CAP (community acquired pneumonia) 12/01/2019   Onychomycosis 03/13/2019   History of amputation of great toe ([place]) 03/08/2019   Current smoker 01/17/2019   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   Unintentional weight loss 09/27/2017   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   Hyperlipidemia associated with type 2 diabetes mellitus ([place]) 01/21/2017   Type 2 diabetes mellitus with diabetic neuropathy, without long-term current use of insulin ([place]) 01/21/2017   Diabetic nephropathy associated with type 2 diabetes mellitus ([place]) 01/21/2017   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   Thrombocytopenia due to drugs    Allergy status to unspecified drugs, medicaments and biological substances status    AAA (  abdominal aortic aneurysm) ([name])-infrarenal 3.4 cm 12/04/2015   Vitamin B12 deficiency 04/22/2015   Anemia of chronic disease 04/18/2015   Hypertension complicating diabetes ([place]) 07/22/2010   CAD (coronary artery disease) 09/17/2008    Past Surgical History:  Procedure Laterality Date   ANKLE FRACTURE SURGERY Left    "crushed"   BACK SURGERY  X 3   "fell 4 stories in 1977"   [place]  09/26/2008   [name] 06/03/2010   FRACTURE SURGERY     LEFT HEART CATHETERIZATION WITH CORONARY ANGIOGRAM N/A 01/21/2012   Procedure: LEFT HEART CATHETERIZATION WITH CORONARY ANGIOGRAM;  Surgeon: Burnell Blanks, MD;   Location: [facility] CATH LAB;  Service: Cardiovascular;  Laterality: N/A;   [place]; ~ [place]  ~ 1998   "3rd OR to reconstruct my back after fall in 1977"   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 and 2nd       Family History  Problem Relation Age of Onset   Heart attack Mother    Cancer Mother    Heart disease Mother    Heart attack Sister    Heart disease Sister     Social History   Tobacco Use   Smoking status: Every Day    Packs/day: 0.50    Years: 20.00    Pack years: 10.00    Types: Cigars, Cigarettes    Start date: 1961   Smokeless tobacco: Never   Tobacco comments:    Started smoking at age 41, quit at age 71, then restarted at age 70  Vaping Use   Vaping Use: Never used  Substance Use Topics   Alcohol use: Not Currently   Drug use: No    Home Medications Prior to Admission medications   Medication Sig Start Date End Date Taking? Authorizing Provider  amLODipine (NORVASC) 5 MG tablet TAKE 1 TABLET BY MOUTH  DAILY 01/24/20   Burnell Blanks, MD  aspirin 81 MG chewable tablet Chew 81 mg by mouth daily.    [provider]  Budeson-Glycopyrrol-Formoterol (BREZTRI AEROSPHERE) 160-9-4.8 MCG/ACT AERO Inhale 2 puffs into the lungs in the morning and at bedtime. 08/06/20   Freddi Starr, MD  Budeson-Glycopyrrol-Formoterol (BREZTRI AEROSPHERE) 160-9-4.8 MCG/ACT AERO Inhale 2 puffs into the lungs in the morning and at bedtime. 08/06/20   Freddi Starr, MD  clopidogrel (PLAVIX) 75 MG tablet TAKE 1 TABLET BY MOUTH  DAILY 01/18/20   Denita Lung, MD  ENBREL SURECLICK 50 MG/ML injection Inject 50 mg into the skin once a week.  04/26/19   [provider]  febuxostat (ULORIC) 40 MG tablet Take 40 mg by mouth daily.    [provider]  fish oil-omega-3 fatty acids 1000 MG capsule Take 1 g by mouth 2 (two) times daily.     [provider]  isosorbide mononitrate (IMDUR) 60 MG 24 hr tablet Take 1 tablet  (60 mg total) by mouth at bedtime. 06/27/20   Burnell Blanks, MD  leflunomide (ARAVA) 20 MG tablet Take 20 mg by mouth daily. 05/21/19   [provider]  metoprolol tartrate (LOPRESSOR) 25 MG tablet TAKE 1 TABLET BY MOUTH  TWICE DAILY 07/07/20   Burnell Blanks, MD  Multiple Vitamins-Minerals (CENTRUM) tablet Take 1 tablet by mouth daily.    [provider]  nitroGLYCERIN (NITROSTAT) 0.4 MG SL tablet Place 0.4 mg under the tongue every 5 (  five) minutes as needed for chest pain. For 3 doses 10/12/11   Denita Lung, MD  predniSONE (DELTASONE) 10 MG tablet Take 1 tablet (10 mg total) by mouth daily with breakfast. 08/06/20   Freddi Starr, MD  pyridoxine (B-6) 100 MG tablet Take 100 mg by mouth daily.    [provider]  simvastatin (ZOCOR) 20 MG tablet TAKE 1 TABLET BY MOUTH AT  BEDTIME 01/24/20   Burnell Blanks, MD    Allergies    Allopurinol and Metformin and related  Review of Systems   Review of Systems  Constitutional:  Positive for appetite change and fatigue. Negative for chills and fever.  HENT:  Negative for ear pain and sore throat.   Eyes:  Negative for pain and visual disturbance.  Respiratory:  Positive for shortness of breath and wheezing. Negative for cough.   Cardiovascular:  Positive for chest pain. Negative for palpitations and leg swelling.  Gastrointestinal:  Negative for abdominal pain, nausea and vomiting.  Genitourinary:  Negative for dysuria and hematuria.  Musculoskeletal:  Negative for arthralgias and back pain.  Skin:  Negative for color change and rash.  Neurological:  Positive for weakness. Negative for seizures, syncope, light-headedness and numbness.  All other systems reviewed and are negative.  Physical Exam Updated Vital Signs BP (!) 178/56   Pulse 75   Temp 97.9 F (36.6 C) (Oral)   Resp (!) 30   Ht 5\' 10"  (1.778 m)   Wt 71.7 kg   SpO2 100%   BMI 22.67 kg/m   Physical Exam Vitals and  nursing note reviewed.  Constitutional:      Appearance: He is well-developed.     Comments: Chronically ill-appearing, not in acute distress.  HENT:     Head: Normocephalic and atraumatic.  Eyes:     Conjunctiva/sclera: Conjunctivae normal.  Cardiovascular:     Rate and Rhythm: Normal rate and regular rhythm.     Heart sounds: No murmur heard. Pulmonary:     Effort: Tachypnea and accessory muscle usage present. No respiratory distress.     Breath sounds: Examination of the right-upper field reveals wheezing. Examination of the left-upper field reveals wheezing. Examination of the right-middle field reveals rales. Examination of the left-middle field reveals rales. Examination of the right-lower field reveals rales. Examination of the left-lower field reveals rales. Wheezing and rales present. No rhonchi.  Abdominal:     Palpations: Abdomen is soft.     Tenderness: There is no abdominal tenderness.  Musculoskeletal:     Cervical back: Neck supple.     Comments: No edema, legs are roughly symmetric  Skin:    General: Skin is warm and dry.  Neurological:     Mental Status: He is alert.    ED Results / Procedures / Treatments   Labs (all labs ordered are listed, but only abnormal results are displayed) Labs Reviewed  CBC WITH DIFFERENTIAL/PLATELET  BRAIN NATRIURETIC PEPTIDE  COMPREHENSIVE METABOLIC PANEL  TROPONIN I (HIGH SENSITIVITY)    EKG None  Radiology No results found.  Procedures Procedures   Medications Ordered in ED Medications  ipratropium-albuterol (DUONEB) 0.5-2.5 (3) MG/3ML nebulizer solution 3 mL (has no administration in time range)  methylPREDNISolone sodium succinate (SOLU-MEDROL) 125 mg/2 mL injection 125 mg (has no administration in time range)  albuterol (VENTOLIN HFA) 108 (90 Base) MCG/ACT inhaler 2 puff (has no administration in time range)    ED Course  I have reviewed the triage vital signs and the nursing  notes.  Pertinent labs & imaging  results that were available during my care of the patient were reviewed by me and considered in my medical decision making (see chart for details).  Clinical Course as of 08/07/20 1633  Thu Aug 07, 2020  1404 Patient reports feeling better after the duoneb. States he is breathing easier but states he is still having chest pain.  [HS]  1423 CBC with Differential(!) Mildly anemic, but improved from week ago.  Consistent with his myelodysplastic disorder. [HS]  1507 WBC(!!): 71.8 Significantly elevated from 7 days ago when it was 42.3   [HS]  1509 Sodium(!): 128 Hyponatremic. Will give 500 bolus [HS]    Clinical Course User Index [HS] Sherrill Raring, PA-C   MDM Rules/Calculators/A&P                          Additional information: Per chart review, his most recent echo was done 06/23/2020 and did not show any changes in his valve disease.  His LV ejection fraction was 55%.  Patient was seen and evaluated pulmonology yesterday.  He was diagnosed with pulmonary fibrosis.  Scheduled a pulmonary function test to start him on bretzi inhaler and started him on prednisone taper.  Per Dr. Angelena Form note 10 days ago, he was diagnosed with chronic stable angina, and does not need additional evaluation following the recent cardiac stress test in June 2022.  I will reach out for advice. Spoke with cardiology Dr. Acie Fredrickson who is agreeable to consult on the patient.  Patient is stable on room air and satting 100%.  Nontoxic-appearing, but he has a significantly elevated white blood cell count.  Additionally, he does have persistent chest pain.  Patient will need to be admitted for observation regarding the chest pain as well as further evaluation of the elevated white blood cell count.  I will speak with the hospitalist.  Spoke to Dr. Aletha Halim is going to admit the patient.  Discussed HPI, physical exam and plan of care for this patient with attending Christ Kick. The attending physician evaluated this patient as  part of a shared visit and agrees with plan of care.   Final Clinical Impression(s) / ED Diagnoses Final diagnoses:  None    Rx / DC Orders ED Discharge Orders     None        Sherrill Raring, [place] 08/07/20 1634    Daleen Bo, MD 08/09/20 1357

## 2020-08-07 NOTE — Progress Notes (Signed)
[place] from Dr. Rudi Rummage about pt. Ordered to HOLD beta blocker and to give pt Imdur 90 mg PO NOW. While in room with pt talking with MD pt stated having CP with pain 6/10 and SOB. Pt given one dose of NTG 0.4 mg sublingual. Pt VS remain stable before and after administration see chart for details. Pt states pain is now 0/10. Will continue to monitor this pt for CP and SOB.

## 2020-08-07 NOTE — ED Notes (Signed)
Patient's RA O2 sat is 100% but patient says he's SOB. O2 on at 2 l n/c per pt request.

## 2020-08-08 ENCOUNTER — Encounter (HOSPITAL_COMMUNITY): Payer: Self-pay | Admitting: Family Medicine

## 2020-08-08 DIAGNOSIS — R072 Precordial pain: Secondary | ICD-10-CM

## 2020-08-08 DIAGNOSIS — D469 Myelodysplastic syndrome, unspecified: Secondary | ICD-10-CM

## 2020-08-08 DIAGNOSIS — I5033 Acute on chronic diastolic (congestive) heart failure: Secondary | ICD-10-CM

## 2020-08-08 DIAGNOSIS — E871 Hypo-osmolality and hyponatremia: Secondary | ICD-10-CM

## 2020-08-08 DIAGNOSIS — N1832 Chronic kidney disease, stage 3b: Secondary | ICD-10-CM

## 2020-08-08 DIAGNOSIS — E1140 Type 2 diabetes mellitus with diabetic neuropathy, unspecified: Secondary | ICD-10-CM

## 2020-08-08 DIAGNOSIS — F17200 Nicotine dependence, unspecified, uncomplicated: Secondary | ICD-10-CM

## 2020-08-08 DIAGNOSIS — J8410 Pulmonary fibrosis, unspecified: Secondary | ICD-10-CM

## 2020-08-08 LAB — COMPREHENSIVE METABOLIC PANEL
ALT: 19 U/L (ref 0–44)
AST: 27 U/L (ref 15–41)
Albumin: 2.8 g/dL — ABNORMAL LOW (ref 3.5–5.0)
Alkaline Phosphatase: 83 U/L (ref 38–126)
Anion gap: 7 (ref 5–15)
BUN: 36 mg/dL — ABNORMAL HIGH (ref 8–23)
CO2: 20 mmol/L — ABNORMAL LOW (ref 22–32)
Calcium: 9 mg/dL (ref 8.9–10.3)
Chloride: 103 mmol/L (ref 98–111)
Creatinine, Ser: 1.55 mg/dL — ABNORMAL HIGH (ref 0.61–1.24)
GFR, Estimated: 46 mL/min — ABNORMAL LOW (ref >60)
Glucose, Bld: 219 mg/dL — ABNORMAL HIGH (ref 70–99)
Potassium: 5 mmol/L (ref 3.5–5.1)
Sodium: 130 mmol/L — ABNORMAL LOW (ref 135–145)
Total Bilirubin: 0.8 mg/dL (ref 0.3–1.2)
Total Protein: 7.8 g/dL (ref 6.5–8.1)

## 2020-08-08 LAB — CBC WITH DIFFERENTIAL/PLATELET
Abs Immature Granulocytes: 16.46 10*3/uL — ABNORMAL HIGH (ref 0.00–0.07)
Basophils Absolute: 0.4 10*3/uL — ABNORMAL HIGH (ref 0.0–0.1)
Basophils Relative: 0 %
Eosinophils Absolute: 0 10*3/uL (ref 0.0–0.5)
Eosinophils Relative: 0 %
HCT: 26.7 % — ABNORMAL LOW (ref 39.0–52.0)
Hemoglobin: 9.2 g/dL — ABNORMAL LOW (ref 13.0–17.0)
Immature Granulocytes: 16 %
Lymphocytes Relative: 5 %
Lymphs Abs: 4.8 10*3/uL — ABNORMAL HIGH (ref 0.7–4.0)
MCH: 28.4 pg (ref 26.0–34.0)
MCHC: 34.5 g/dL (ref 30.0–36.0)
MCV: 82.4 fL (ref 80.0–100.0)
Monocytes Absolute: 5.5 10*3/uL — ABNORMAL HIGH (ref 0.1–1.0)
Monocytes Relative: 5 %
Neutro Abs: 77.2 10*3/uL — ABNORMAL HIGH (ref 1.7–7.7)
Neutrophils Relative %: 74 %
Platelets: 234 10*3/uL (ref 150–400)
RBC: 3.24 MIL/uL — ABNORMAL LOW (ref 4.22–5.81)
RDW: 20.9 % — ABNORMAL HIGH (ref 11.5–15.5)
WBC: 104.4 10*3/uL (ref 4.0–10.5)
nRBC: 0.3 % — ABNORMAL HIGH (ref 0.0–0.2)

## 2020-08-08 LAB — GLUCOSE, CAPILLARY
Glucose-Capillary: 139 mg/dL — ABNORMAL HIGH (ref 70–99)
Glucose-Capillary: 154 mg/dL — ABNORMAL HIGH (ref 70–99)
Glucose-Capillary: 157 mg/dL — ABNORMAL HIGH (ref 70–99)
Glucose-Capillary: 183 mg/dL — ABNORMAL HIGH (ref 70–99)

## 2020-08-08 LAB — PATHOLOGIST SMEAR REVIEW

## 2020-08-08 LAB — HEMOGLOBIN A1C
Hgb A1c MFr Bld: 4.8 % (ref 4.8–5.6)
Mean Plasma Glucose: 91.06 mg/dL

## 2020-08-08 MED ORDER — FUROSEMIDE 10 MG/ML IJ SOLN
40.0000 mg | Freq: Once | INTRAMUSCULAR | Status: AC
  Administered 2020-08-08: 40 mg via INTRAVENOUS
  Filled 2020-08-08: qty 4

## 2020-08-08 MED ORDER — BENZONATATE 100 MG PO CAPS
100.0000 mg | ORAL_CAPSULE | Freq: Two times a day (BID) | ORAL | Status: DC | PRN
  Administered 2020-08-08: 100 mg via ORAL
  Filled 2020-08-08: qty 1

## 2020-08-08 MED ORDER — MENTHOL 3 MG MT LOZG
1.0000 | LOZENGE | OROMUCOSAL | Status: DC | PRN
  Administered 2020-08-08: 3 mg via ORAL
  Filled 2020-08-08 (×2): qty 9

## 2020-08-08 MED ORDER — MORPHINE SULFATE (PF) 2 MG/ML IV SOLN: 0.5000 mg | Freq: Once | INTRAVENOUS | Status: DC

## 2020-08-08 MED ORDER — MORPHINE SULFATE (PF) 2 MG/ML IV SOLN
0.5000 mg | INTRAVENOUS | Status: AC
  Administered 2020-08-08: 0.5 mg via INTRAVENOUS
  Filled 2020-08-08: qty 1

## 2020-08-08 NOTE — Progress Notes (Signed)
Progress Note  Patient Name: Paul [name] Date of Encounter: 08/08/2020  [facility] Cardiologist: Lauree Chandler, MD   Subjective   Pain across upper chest and shoulders last night.   Inpatient Medications    Scheduled Meds:  amLODipine  5 mg Oral Daily   aspirin  81 mg Oral Daily   clopidogrel  75 mg Oral Daily   enoxaparin (LOVENOX) injection  40 mg Subcutaneous Q24H   febuxostat  40 mg Oral Daily   fluticasone furoate-vilanterol  1 puff Inhalation Daily   And   umeclidinium bromide  1 puff Inhalation Daily   insulin aspart  0-9 Units Subcutaneous TID WC   isosorbide mononitrate  90 mg Oral Daily   leflunomide  20 mg Oral Daily   metoprolol tartrate  25 mg Oral BID   multivitamin with minerals  1 tablet Oral Daily   nicotine  14 mg Transdermal Daily   predniSONE  40 mg Oral Q breakfast   simvastatin  20 mg Oral QPM   sodium chloride flush  3 mL Intravenous Q12H   Continuous Infusions:  sodium chloride     PRN Meds: sodium chloride, acetaminophen **OR** acetaminophen, nitroGLYCERIN, sodium chloride flush   Vital Signs    Vitals:   08/08/20 0300 08/08/20 0831 08/08/20 0908 08/08/20 1000  BP: (!) 120/91 (!) 131/52  (!) 126/50  Pulse: 67 62  61  Resp: 20 18    Temp: (!) 95.7 F (35.4 C) (!) 97.3 F (36.3 C)    TempSrc: Axillary Oral    SpO2: 100% 100% 98%   Weight:      Height:        Intake/Output Summary (Last 24 hours) at 08/08/2020 1007 Last data filed at 08/07/2020 2100 Gross per 24 hour  Intake --  Output 500 ml  Net -500 ml   Last 3 Weights 08/07/2020 08/07/2020 08/06/2020  Weight (lbs) 157 lb 14.4 oz 158 lb 158 lb  Weight (kg) 71.623 kg 71.668 kg 71.668 kg      Telemetry    Sinus- Personally Reviewed  ECG     No AM EKG- Personally Reviewed  Physical Exam   GEN: No acute distress.   Neck: No JVD Cardiac: RRR, no murmurs, rubs, or gallops.  Respiratory: Clear to auscultation bilaterally. GI: Soft, nontender, non-distended   MS: No edema; No deformity. Neuro:  Nonfocal  Psych: Normal affect   Labs    High Sensitivity Troponin:   Recent Labs  Lab 08/07/20 1301 08/07/20 1458 08/07/20 2252  TROPONINIHS 9 11 10       Chemistry Recent Labs  Lab 08/07/20 1301 08/08/20 0234  NA 128* 130*  K 5.3* 5.0  CL 102 103  CO2 20* 20*  GLUCOSE 274* 219*  BUN 33* 36*  CREATININE 1.52* 1.55*  CALCIUM 8.6* 9.0  PROT 7.8 7.8  ALBUMIN 2.7* 2.8*  AST 24 27  ALT 19 19  ALKPHOS 91 83  BILITOT 1.1 0.8  GFRNONAA 47* 46*  ANIONGAP 6 7     Hematology Recent Labs  Lab 08/07/20 1301 08/08/20 0234  WBC 71.8* 104.4*  RBC 3.43* 3.24*  HGB 9.4* 9.2*  HCT 28.6* 26.7*  MCV 83.4 82.4  MCH 27.4 28.4  MCHC 32.9 34.5  RDW 21.1* 20.9*  PLT 221 234    BNP Recent Labs  Lab 08/07/20 1301  BNP 634.6*     DDimer No results for input(s): DDIMER in the last 168 hours.   Radiology    DG  Chest 2 View  Result Date: 08/07/2020 CLINICAL DATA:  Shortness of breath. EXAM: CHEST - 2 VIEW COMPARISON:  CT 07/14/2020.  Chest x-ray 12/01/2019. FINDINGS: Stable mediastinal prominence consistent with mediastinal fat again noted. Stable cardiomegaly. No pulmonary venous congestion. Chronic interstitial changes are again noted. Superimposed active interstitial process including interstitial edema and or pneumonitis cannot be excluded. Small bilateral pleural effusions are noted. Stable right apical pleural thickening consistent scarring. No pneumothorax. Degenerative change thoracic spine. IMPRESSION: 1.  Stable cardiomegaly.  No pulmonary venous congestion. 2. Chronic interstitial changes are again noted. Superimposed active interstitial process including interstitial edema and or pneumonitis cannot be excluded. Small bilateral pleural effusions are noted. Electronically Signed   By: Marcello Moores  Register   On: 08/07/2020 13:19    Cardiac Studies     Patient Profile     80 y.o. male with history of CAD, DM, bilateral carotid artery  disease, HTN, HLD, myelodysplastic disease, anemia of chronic disease, pulmonary fibrosis and DVT   Assessment & Plan    Chest pain: I know him well from following him in the outpatient setting. I have felt that his chest pain and dyspnea were not cardiac related over the past few months. Normal stress test in June 2022 with no ischemia. LV systolic function is normal by echo May 2022. He has a known RCA occlusion not amenable to PCI. Troponin negative this admission. EKG without ischemic changes. His WBC is over 100. I do not think that his chest pain is cardiac related. Consider other causes of chest pain like PE. No further ischemic workup at this time. Imdur increased. Hematology consult for likely malignancy given hematological abnormalities.   2.   Acute on chronic diastolic CHF: Continue diuresis with IV Lasix today  For questions or updates, please contact [place] Please consult [email or web address] for contact info under        Signed, Lauree Chandler, MD  08/08/2020, 10:07 AM

## 2020-08-08 NOTE — Progress Notes (Signed)
39- Dr. Cyndia Diver notified about pt complaints of chest pain 8/10. NEW order received to give pt Morphine 0.5 mg IVP.   0255- Pt stated pain is a 0/10 and is comfortable at this time. Will monitor pt for any further issues with pain.

## 2020-08-08 NOTE — Progress Notes (Signed)
PROGRESS NOTE    Paul [name]  OEV:[id number] DOB: 1940/11/15 DOA: 08/07/2020 PCP: Denita Lung, MD    Brief Narrative:  Paul [name] is a 80 year old male with past medical history significant for MDS/MPN, CAD, DM2, bilateral carotid artery disease, essential hypertension, hyperlipidemia, anemia of chronic disease, history of DVT, pulmonary fibrosis, RA, tobacco use disorder, AAA, CKD who presents to [facility] ED with chest pain.  Patient reports chest pain localized to his entire anterior chest wall without radiation to his arm or jaw.  Patient states pain lasted for roughly 15-20 minutes and relieved with nitroglycerin.  Associated also with shortness of breath.  Denies any diaphoresis or reproducible pain.  Reports legs have been swelling for the last 2 days but denies any weight gain.  No fever/chills, no sick contacts.  Patient was recently seen by his pulmonologist, and given prolonged steroid taper for possible pulmonary fibrosis flare.  In the ED, temperature 97.9 F, HR 75, RR 30, BP 178/56, SPO2 100% on 3 L nasal cannula.  Sodium 128, potassium 5.3, chloride 102, CO2 20, glucose 274, BUN 33, creatinine 1.52, AST 24, ALT 19, total bilirubin 1.1.  WBC 71.8, hemoglobin 9.4, MCV 83.4, platelets 221.  BNP 634.6.  High sensitive troponin 9.  COVID-19 PCR negative.  Influenza A/B PCR negative.  Chest x-ray with stable cardiomegaly, no pulmonary venous congestion.  Chronic interstitial changes with superimposed interstitial process including interstitial edema versus pneumonitis with small bilateral pleural effusions.  EKG, NSR with rate 62, LVH, nonspecific ST changes with no evidence of acute ischemia/dynamic changes.  Cardiology consulted.  TRH consulted for further evaluation and management of shortness of breath, chest pain and significant leukocytosis.   Assessment & Plan:   Principal Problem:   Chest pain Active Problems:   CAD (coronary artery disease)   Anemia of chronic  disease   Shortness of breath   Hyperlipidemia associated with type 2 diabetes mellitus ([place])   Type 2 diabetes mellitus with diabetic neuropathy, without long-term current use of insulin ([name])   MDS/MPN (myelodysplastic/myeloproliferative neoplasms) ([name])   Current smoker   Hyponatremia   Pulmonary fibrosis ([name])   CKD (chronic kidney disease), stage III ([name])   Atypical chest pain Patient presenting with chest pain localized to his anterior chest without radiation, worse with exertion.  Known history of CAD with normal stress test June 2022.  History of RCA occlusion not amenable to PCI.  EKG without ischemic changes and troponin negative x2.  Seen by cardiology, does not believe his symptoms are of cardiac etiology. --Continue aspirin, Plavix, beta-blocker, and statin --Continue to monitor on telemetry  Acute on chronic diastolic congestive heart failure Patient presenting with shortness of breath, BNP elevated.  Notable pitting edema bilateral lower extremities.  Recent TTE May 2022 with LVEF 55%.  --Repeat furosemide 40 mg IV x1 today --Strict I's and O's and daily weights --BMP daily  Leukocytosis in the setting of MDS/MPN Upon presentation to the ED, patient was noted to have an elevated WBC count of 71.8; which is significantly changed since 07/17/2020 in which it was 24.6.  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  Low suspicion for infectious process. --Oncology/hematology following, appreciate assistance --WBC 71.8>104.4 (was 24.6 on 07/17/2020) --peripheral smear review by pathology: pending --CBC daily  Pulmonary fibrosis Follows with pulmonology outpatient, Dr. Erin Fulling.  Outpatient PFTs pending. Recently started on long prednisone taper; prednisone 40 mg daily x10 days followed by  20 mg daily x10 days followed by 10 mg daily. --Oxygenating well on room air --Prednisone 40 mg p.o.  daily --Breo-Ellipta and Incruse Ellipta inhaler as hospital substitution for home Breztri inhaler --Outpatient follow-up with pulmonology  Coronary artery disease Left heart catheterization 2010 with complete total occlusion RCA with moderate disease of LAD and left circumflex.  Recent stress test 07/2020 negative for ischemia. --Isosorbide mononitrate increased to 90 mg p.o. daily --Continue aspirin, Plavix, statin, beta-blocker  Type 2 diabetes mellitus --SSI for coverage --CBG before every meal/at bedtime  CKD stage IIIa Creatinine 1.52 on admission.  Baseline 1.3-1.6.,  Stable. --Avoid nephrotoxins, renal dose all medications --BMP daily  Anemia of chronic disease Followed by hematology, likely secondary to underlying MDS.  Receives Retacrit infusions every 2 weeks.  Hemoglobin 9.2, stable.  Hyperlipidemia: Simvastatin 20 mg p.o. daily  Essential hypertension BP 120/91, well controlled. --Amlodipine 10 mg p.o. daily --Metoprolol tartrate 25 mg p.o. twice daily --Isosorbide mononitrate increased to 90 mg p.o. daily per cardiology.  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  Continue annual imaging with CTA/MRA  AAA Abdominal ultrasound 06/2019 with 4.1 cm infrarenal AAA, mildly enlarged since 2017.  Continue outpatient follow-up with PCP.  Hyperkalemia Potassium 5.3 on admission, received 1 dose of IV Lasix.  Repeat potassium this morning 5.0.  Rheumatoid arthritis On Enbrel injection once weekly, leflunomide 20 mg p.o. daily   DVT prophylaxis: enoxaparin (LOVENOX) injection 40 mg Start: 08/07/20 1800   Code Status: Full Code Family Communication: Updated patient spouse present at bedside this morning  Disposition Plan:  Level of care: Telemetry Cardiac Status is: Inpatient  Remains inpatient appropriate because:Ongoing diagnostic testing needed not appropriate for outpatient work up, Unsafe d/c  plan, and Inpatient level of care appropriate due to severity of illness  Dispo: The patient is from: Home              Anticipated d/c is to: Home              Patient currently is not medically stable to d/c.   Difficult to place patient No   Consultants:  Hematology/oncology Cardiology  Procedures:  None  Antimicrobials:  None   Subjective: Patient seen examined bedside, resting comfortably.  Spouse present.  Currently chest pain-free.  Seen by cardiology this morning, believe his symptoms not related to cardiac origin.  Also seen by hematology/oncology, awaiting pathology read of peripheral smear.  No other complaints or concerns at this time.  Denies headache, no visual changes, no current chest pain, no current shortness of breath, no abdominal pain, no fever/chills/night sweats, no nausea/vomiting/diarrhea, no abdominal pain, no weakness, no fatigue, no paresthesias.  No acute events overnight per nursing staff.  Objective: Vitals:   08/08/20 0831 08/08/20 0908 08/08/20 1000 08/08/20 1146  BP: (!) 131/52  (!) 126/50 (!) 136/49  Pulse: 62  61 (!) 59  Resp: 18   20  Temp: (!) 97.3 F (36.3 C)   (!) 97.3 F (36.3 C)  TempSrc: Oral   Oral  SpO2: 100% 98%  100%  Weight:      Height:        Intake/Output Summary (Last 24 hours) at 08/08/2020 1513 Last data filed at 08/08/2020 0900 Gross per 24 hour  Intake --  Output 675 ml  Net -675 ml   Filed Weights   08/07/20 1150 08/07/20 2030  Weight: 71.7 kg 71.6 kg    Examination:  General exam: Appears calm and comfortable  Respiratory system: Clear to auscultation. Respiratory effort normal.  On room air Cardiovascular system: S1 & S2 heard, RRR. No JVD, murmurs, rubs, gallops or clicks. No pedal edema. Gastrointestinal system: Abdomen is nondistended, soft and nontender. No organomegaly or masses felt. Normal bowel sounds heard. Central nervous system: Alert and oriented. No focal neurological deficits. Extremities:  Symmetric 5 x 5 power. Skin: No rashes, lesions or ulcers Psychiatry: Judgement and insight appear normal. Mood & affect appropriate.     Data Reviewed: I have personally reviewed following labs and imaging studies  CBC: Recent Labs  Lab 08/07/20 1301 08/08/20 0234  WBC 71.8* 104.4*  NEUTROABS 51.6* 77.2*  HGB 9.4* 9.2*  HCT 28.6* 26.7*  MCV 83.4 82.4  PLT 221 885   Basic Metabolic Panel: Recent Labs  Lab 08/07/20 1301 08/08/20 0234  NA 128* 130*  K 5.3* 5.0  CL 102 103  CO2 20* 20*  GLUCOSE 274* 219*  BUN 33* 36*  CREATININE 1.52* 1.55*  CALCIUM 8.6* 9.0   GFR: Estimated Creatinine Clearance: 39.8 mL/min (A) (by C-G formula based on SCr of 1.55 mg/dL (H)). Liver Function Tests: Recent Labs  Lab 08/07/20 1301 08/08/20 0234  AST 24 27  ALT 19 19  ALKPHOS 91 83  BILITOT 1.1 0.8  PROT 7.8 7.8  ALBUMIN 2.7* 2.8*   No results for input(s): LIPASE, AMYLASE in the last 168 hours. No results for input(s): AMMONIA in the last 168 hours. Coagulation Profile: No results for input(s): INR, PROTIME in the last 168 hours. Cardiac Enzymes: No results for input(s): CKTOTAL, CKMB, CKMBINDEX, TROPONINI in the last 168 hours. BNP (last 3 results) No results for input(s): PROBNP in the last 8760 hours. HbA1C: Recent Labs    08/08/20 0234  HGBA1C 4.8   CBG: Recent Labs  Lab 08/07/20 1830 08/07/20 2039 08/08/20 0827 08/08/20 1144  GLUCAP 201* 174* 139* 154*   Lipid Profile: No results for input(s): CHOL, HDL, LDLCALC, TRIG, CHOLHDL, LDLDIRECT in the last 72 hours. Thyroid Function Tests: No results for input(s): TSH, T4TOTAL, FREET4, T3FREE, THYROIDAB in the last 72 hours. Anemia Panel: No results for input(s): VITAMINB12, FOLATE, FERRITIN, TIBC, IRON, RETICCTPCT in the last 72 hours. Sepsis Labs: No results for input(s): PROCALCITON, LATICACIDVEN in the last 168 hours.  Recent Results (from the past 240 hour(s))  Resp Panel by RT-PCR (Flu A&B, Covid)  Nasopharyngeal Swab     Status: None   Collection Time: 08/07/20  1:01 PM   Specimen: Nasopharyngeal Swab; Nasopharyngeal(NP) swabs in vial transport medium  Result Value Ref Range Status   SARS Coronavirus 2 by RT PCR NEGATIVE NEGATIVE Final    Comment: (NOTE) SARS-CoV-2 target nucleic acids are NOT DETECTED.  The SARS-CoV-2 RNA is generally detectable in upper respiratory specimens during the acute phase of infection. The lowest concentration of SARS-CoV-2 viral copies this assay can detect is 138 copies/mL. A negative result does not preclude SARS-Cov-2 infection and should not be used as the sole basis for treatment or other patient management decisions. A negative result may occur with  improper specimen collection/handling, submission of specimen other than nasopharyngeal swab, presence of viral mutation(s) within the areas targeted by this assay, and inadequate number of viral copies(<138 copies/mL). A negative result must be combined with clinical observations, patient history, and epidemiological information. The expected result is Negative.  Fact Sheet for Patients:  EntrepreneurPulse.com.au  Fact Sheet for Healthcare Providers:  IncredibleEmployment.be  This test is no t  yet approved or cleared by the [place] and  has been authorized for detection and/or diagnosis of SARS-CoV-2 by FDA under an Emergency Use Authorization (EUA). This EUA will remain  in effect (meaning this test can be used) for the duration of the COVID-19 declaration under Section 564(b)(1) of the Act, 21 U.S.C.section 360bbb-3(b)(1), unless the authorization is terminated  or revoked sooner.       Influenza A by PCR NEGATIVE NEGATIVE Final   Influenza B by PCR NEGATIVE NEGATIVE Final    Comment: (NOTE) The Xpert Xpress SARS-CoV-2/FLU/RSV plus assay is intended as an aid in the diagnosis of influenza from Nasopharyngeal swab specimens and should not be  used as a sole basis for treatment. Nasal washings and aspirates are unacceptable for Xpert Xpress SARS-CoV-2/FLU/RSV testing.  Fact Sheet for Patients: EntrepreneurPulse.com.au  Fact Sheet for Healthcare Providers: IncredibleEmployment.be  This test is not yet approved or cleared by the [place] FDA and has been authorized for detection and/or diagnosis of SARS-CoV-2 by FDA under an Emergency Use Authorization (EUA). This EUA will remain in effect (meaning this test can be used) for the duration of the COVID-19 declaration under Section 564(b)(1) of the Act, 21 U.S.C. section 360bbb-3(b)(1), unless the authorization is terminated or revoked.  Performed at [facility], [place] [address], [place],  [address]          Radiology Studies: DG Chest 2 View  Result Date: 08/07/2020 CLINICAL DATA:  Shortness of breath. EXAM: CHEST - 2 VIEW COMPARISON:  CT 07/14/2020.  Chest x-ray 12/01/2019. FINDINGS: Stable mediastinal prominence consistent with mediastinal fat again noted. Stable cardiomegaly. No pulmonary venous congestion. Chronic interstitial changes are again noted. Superimposed active interstitial process including interstitial edema and or pneumonitis cannot be excluded. Small bilateral pleural effusions are noted. Stable right apical pleural thickening consistent scarring. No pneumothorax. Degenerative change thoracic spine. IMPRESSION: 1.  Stable cardiomegaly.  No pulmonary venous congestion. 2. Chronic interstitial changes are again noted. Superimposed active interstitial process including interstitial edema and or pneumonitis cannot be excluded. Small bilateral pleural effusions are noted. Electronically Signed   By: Marcello Moores  Register   On: 08/07/2020 13:19        Scheduled Meds:  amLODipine  5 mg Oral Daily   aspirin  81 mg Oral Daily   clopidogrel  75 mg Oral Daily   enoxaparin (LOVENOX) injection  40 mg Subcutaneous Q24H    febuxostat  40 mg Oral Daily   fluticasone furoate-vilanterol  1 puff Inhalation Daily   And   umeclidinium bromide  1 puff Inhalation Daily   insulin aspart  0-9 Units Subcutaneous TID WC   isosorbide mononitrate  90 mg Oral Daily   leflunomide  20 mg Oral Daily   metoprolol tartrate  25 mg Oral BID   multivitamin with minerals  1 tablet Oral Daily   nicotine  14 mg Transdermal Daily   predniSONE  40 mg Oral Q breakfast   simvastatin  20 mg Oral QPM   sodium chloride flush  3 mL Intravenous Q12H   Continuous Infusions:  sodium chloride       LOS: 1 day    Time spent: 39 minutes spent on chart review, discussion with nursing staff, consultants, updating family and interview/physical exam; more than 50% of that time was spent in counseling and/or coordination of care.    Leveon Pelzer J [place] DO [facility] Available via Epic secure chat 7am-7pm After these hours, please refer to coverage provider listed  on [email or web address] 08/08/2020, 3:13 PM

## 2020-08-08 NOTE — Progress Notes (Addendum)
0050-Pt called nurse to room stated having 10/10 CP pain to upper back and anterior chest radiating from shoulder to shoulder. Dr. Rudi Rummage paged about pt awaitng call back.   0100- Dr. Rudi Rummage ordered to give pt Tylenol 650 mg PO as ordered and to continue NTG 0.4 mg sublingual for CP as ordered. See chart for details on pt's pain and NTG administration.  0140- pt states pain level is at 1/10. Will continue to monitor pt's chest pain.

## 2020-08-08 NOTE — Progress Notes (Signed)
Report received from [place], RN in ED on pt awaiting pt arrival to unit.

## 2020-08-08 NOTE — Progress Notes (Addendum)
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  He has a longstanding history of anemia and has had some leukocytosis and thrombocytosis in the past.  Due to worsening of his anemia, he was started on treatment with Retacrit.  He has been receiving this every 2 weeks in our office.  He last received this on 6/30.  He presented to the emergency department with chest pain and shortness of breath.  Cardiology is following and do not feel that his chest pain and dyspnea are cardiac in etiology.  On admission, the patient's WBC was 71.8 with a hemoglobin of 9.4.  This morning, WBC is now greater than 100,000.  Pathologist review of peripheral blood smear is currently in process.  Of note, the patient was seen by pulmonology on 7/6 and was started on prednisone initially with 40 mg daily for 10 days, then 20 mg daily for 10 days, and then 10 mg daily until follow-up visit.  He did receive a dose of Solu-Medrol 125 mg IV on 7/7 and has been restarted on his prednisone as ordered by pulmonology beginning this morning.  The patient is resting quietly in bed today.  His wife is at the bedside.  He reports resolution of his chest pain today.  He has his baseline shortness of breath.  He denies any recent fevers or chills.  Denies abdominal pain, nausea, vomiting.  No bleeding reported.  He reports that his lower extremity edema has improved since admission.  REVIEW OF SYSTEMS:   Constitutional: Denies fevers, chills  Eyes: Denies blurriness of vision Ears, nose, mouth, throat, and face: Denies mucositis or sore throat Respiratory: Reports his baseline shortness of breath Cardiovascular: Chest pain has resolved today Gastrointestinal:  Denies nausea, heartburn or change in bowel habits Skin: Denies abnormal skin rashes Lymphatics: Denies new lymphadenopathy or easy bruising Neurological:Denies numbness, tingling or new weaknesses Behavioral/Psych: Mood is  stable, no new changes  Extremities: Lower extremity edema improved with diuresis All other systems were reviewed with the patient and are negative.  I have reviewed the past medical history, past surgical history, social history and family history with the patient and they are unchanged from previous note.   PHYSICAL EXAMINATION: ECOG PERFORMANCE STATUS: 2 - Symptomatic, <50% confined to bed  Vitals:   08/08/20 1000 08/08/20 1146  BP: (!) 126/50 (!) 136/49  Pulse: 61 (!) 59  Resp:  20  Temp:  (!) 97.3 F (36.3 C)  SpO2:  100%   Filed Weights   08/07/20 1150 08/07/20 2030  Weight: 71.7 kg 71.6 kg    Intake/Output from previous day: 07/07 0701 - 07/08 0700 In: -  Out: 500 [Urine:500]  GENERAL:alert, no distress and comfortable SKIN: skin color, texture, turgor are normal, no rashes or significant lesions EYES: Right eye with small hemorrhage OROPHARYNX:no exudate, no erythema and lips, buccal mucosa, and tongue normal  LUNGS: Diminished bilaterally HEART: regular rate & rhythm and no murmurs and trace lower extremity edema ABDOMEN:abdomen soft, non-tender and normal bowel sounds NEURO: alert & oriented x 3 with fluent speech, no focal motor/sensory deficits  LABORATORY DATA:  I have reviewed the data as listed CMP Latest Ref Rng & Units 08/08/2020 08/07/2020 01/11/2020  Glucose 70 - 99 mg/dL 219(H) 274(H) 109(H)  BUN 8 - 23 mg/dL 36(H) 33(H) 27  Creatinine 0.61 - 1.24 mg/dL 1.55(H) 1.52(H) 1.27  Sodium 135 - 145 mmol/L 130(L) 128(L) 136  Potassium 3.5 - 5.1 mmol/L 5.0 5.3(H) 5.0  Chloride 98 - 111 mmol/L 103 102 106  CO2 22 - 32 mmol/L 20(L) 20(L) 22  Calcium 8.9 - 10.3 mg/dL 9.0 8.6(L) 8.5(L)  Total Protein 6.5 - 8.1 g/dL 7.8 7.8 7.7  Total Bilirubin 0.3 - 1.2 mg/dL 0.8 1.1 0.7  Alkaline Phos 38 - 126 U/L 83 91 128(H)  AST 15 - 41 U/L 27 24 26   ALT 0 - 44 U/L 19 19 17     Lab Results  Component Value Date   WBC 104.4 (HH) 08/08/2020   HGB 9.2 (L) 08/08/2020    HCT 26.7 (L) 08/08/2020   MCV 82.4 08/08/2020   PLT 234 08/08/2020   NEUTROABS 77.2 (H) 08/08/2020    DG Chest 2 View  Result Date: 08/07/2020 CLINICAL DATA:  Shortness of breath. EXAM: CHEST - 2 VIEW COMPARISON:  CT 07/14/2020.  Chest x-ray 12/01/2019. FINDINGS: Stable mediastinal prominence consistent with mediastinal fat again noted. Stable cardiomegaly. No pulmonary venous congestion. Chronic interstitial changes are again noted. Superimposed active interstitial process including interstitial edema and or pneumonitis cannot be excluded. Small bilateral pleural effusions are noted. Stable right apical pleural thickening consistent scarring. No pneumothorax. Degenerative change thoracic spine. IMPRESSION: 1.  Stable cardiomegaly.  No pulmonary venous congestion. 2. Chronic interstitial changes are again noted. Superimposed active interstitial process including interstitial edema and or pneumonitis cannot be excluded. Small bilateral pleural effusions are noted. Electronically Signed   By: Marcello Moores  Register   On: 08/07/2020 13:19   CT Chest Wo Contrast  Result Date: 07/14/2020 CLINICAL DATA:  Dyspnea. History of interstitial lung disease and thoracic adenopathy. EXAM: CT CHEST WITHOUT CONTRAST TECHNIQUE: Multidetector CT imaging of the chest was performed following the standard protocol without IV contrast. COMPARISON:  12/01/2019 chest CT angiogram. 11/03/2017 high-resolution chest CT. FINDINGS: Cardiovascular: Top-normal heart size. No significant pericardial effusion/thickening. Left main and 3 vessel coronary atherosclerosis. Atherosclerotic thoracic aorta with dilated 4.1 cm ascending thoracic aorta. Top-normal caliber main pulmonary artery (3.1 cm diameter). Mediastinum/Nodes: No discrete thyroid nodules. Unremarkable esophagus. No axillary adenopathy. Mild right paratracheal adenopathy up to 1.1 cm short axis diameter (series 2/image 38), stable. Coarsely calcified nonenlarged right paratracheal,  subcarinal and right hilar nodes, compatible with prior granulomatous disease, unchanged. No new pathologically enlarged mediastinal or discrete hilar nodes on these noncontrast images. Lungs/Pleura: No pneumothorax. Stable calcified bilateral pleural plaques. Small dependent bilateral pleural effusions, left greater than right, similar. Peripheral right middle lobe solid 5 mm pulmonary nodule (series 3/image 106), new. No acute consolidative airspace disease, lung masses or additional significant pulmonary nodules. Stable calcified subcentimeter granuloma in the superior segment right lower lobe. Prominent patchy confluent subpleural reticulation and ground-glass opacity throughout both lungs with associated mild traction bronchiectasis and architectural distortion. There is a basilar predominance to these findings. Scattered mild honeycombing at both lung bases. Findings appear mildly progressive since 11/03/2017 high-resolution chest CT study. Upper abdomen: Granulomatous calcifications throughout the liver and spleen, unchanged. Musculoskeletal: No aggressive appearing focal osseous lesions. Marked thoracic spondylosis. Mild chronic T12 vertebral compression fracture. IMPRESSION: 1. Bilateral calcified pleural plaques and small bilateral pleural effusions, compatible with asbestos related pleural disease. 2. Spectrum of findings compatible with basilar predominant fibrotic interstitial lung disease with mild honeycombing, mildly progressed since 2019 high-resolution chest CT study. 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 Findings are consistent with UIP per consensus guidelines: Diagnosis of Idiopathic Pulmonary Fibrosis: An Official ATS/ERS/JRS/ALAT Clinical Practice Guideline. [place], Iss 5, [phone], Oct 02 2016. 3.  New 5 mm right middle lobe solid pulmonary nodule. Follow-up chest CT recommended in 6 months in this high risk patient. 4. Dilated 4.1 cm ascending  thoracic aorta. Recommend annual imaging followup by CTA or MRA. This recommendation follows 2010 ACCF/AHA/AATS/ACR/ASA/SCA/SCAI/SIR/STS/SVM Guidelines for the Diagnosis and Management of Patients with Thoracic Aortic Disease. Circulation. 2010; 121: E675-Q492. Aortic aneurysm NOS (ICD10-I71.9). 5. Stable mild mediastinal lymphadenopathy, nonspecific, probably reactive. 6. Left main and 3 vessel coronary atherosclerosis. 7. Aortic Atherosclerosis (ICD10-I70.0). Electronically Signed   By: Ilona Sorrel M.D.   On: 07/14/2020 15:24   MYOCARDIAL PERFUSION IMAGING  Result Date: 07/14/2020  The left ventricular ejection fraction is normal (55-65%).  Nuclear stress EF: 61%.  There was no ST segment deviation noted during stress.  There is a small defect of moderate severity present in the basal inferior, mid inferior and apical inferior location. The defect is non-reversible. In the setting of normal LVF, this is most consistent with diaphragmatic attenuation artifact. No ischemia noted.  This is a low risk study.     ASSESSMENT AND PLAN: 1.  MDS/MPN 2.  Leukocytosis 3.  Anemia 4.  Chest pain 5.  Pulmonary fibrosis 6.  CAD 7.  Type 2 diabetes mellitus 8.  Hyponatremia, chronic 9.  CKD 10.  Tobacco dependence 11.  RA  -The patient is followed by our office for MDS/MPN.  Currently receives Retacrit.  Next dose due 7/14.  His hemoglobin is overall stable. -The patient has significant leukocytosis this admission.?  Leukemoid reaction to steroids versus inflammation versus transformation of MDS.  Pathology review of peripheral blood smear is currently pending.  Will follow up on these results. -Management of other chronic medical conditions per hospitalist.   LOS: 1 day   Mikey Bussing, DNP, AGPCNP-BC, AOCNP 08/08/20  Addendum   I have seen the patient, examined him. I agree with the assessment and and plan and have edited the notes.   Patient was recently seen by pulmonology for newly  diagnosed pulmonary fibrosis, and started prednisone 40 mg daily on 08/06/20.  He developed severe chest pain on day 2 of prednisone, and was admitted for further work-up.  I reviewed his peripheral smear myself today, and agree with pathology review.  His leukocytosis is mainly neutrophil with left shift and maturation, no significant blasts.  Acute increase of white blood cell count is related to steroid, no evidence of acute leukemia transformation.  His anemia is stable, no other clinical concern for worsening MDS/MPN. No need further work up or treatment for now. He is scheduled for repeat lab, follow-up and Retacrit injection next Thursday in our office.  I recommend pulmonary consultation, to see if he still needs to continue prednisone, since his acute chest pain started after 2 doses of prednisone and he still has mild intermittent chest discomfort.   Paul [name]  08/08/2020

## 2020-08-08 NOTE — Plan of Care (Signed)

## 2020-08-09 ENCOUNTER — Encounter (HOSPITAL_COMMUNITY): Payer: Self-pay | Admitting: Family Medicine

## 2020-08-09 DIAGNOSIS — M059 Rheumatoid arthritis with rheumatoid factor, unspecified: Secondary | ICD-10-CM

## 2020-08-09 DIAGNOSIS — Z720 Tobacco use: Secondary | ICD-10-CM

## 2020-08-09 DIAGNOSIS — I2510 Atherosclerotic heart disease of native coronary artery without angina pectoris: Secondary | ICD-10-CM

## 2020-08-09 DIAGNOSIS — J849 Interstitial pulmonary disease, unspecified: Secondary | ICD-10-CM

## 2020-08-09 DIAGNOSIS — D638 Anemia in other chronic diseases classified elsewhere: Secondary | ICD-10-CM

## 2020-08-09 LAB — BASIC METABOLIC PANEL
Anion gap: 3 — ABNORMAL LOW (ref 5–15)
BUN: 46 mg/dL — ABNORMAL HIGH (ref 8–23)
CO2: 22 mmol/L (ref 22–32)
Calcium: 8.4 mg/dL — ABNORMAL LOW (ref 8.9–10.3)
Chloride: 101 mmol/L (ref 98–111)
Creatinine, Ser: 1.69 mg/dL — ABNORMAL HIGH (ref 0.61–1.24)
GFR, Estimated: 41 mL/min — ABNORMAL LOW (ref >60)
Glucose, Bld: 124 mg/dL — ABNORMAL HIGH (ref 70–99)
Potassium: 5.5 mmol/L — ABNORMAL HIGH (ref 3.5–5.1)
Sodium: 126 mmol/L — ABNORMAL LOW (ref 135–145)

## 2020-08-09 LAB — CBC
HCT: 22.4 % — ABNORMAL LOW (ref 39.0–52.0)
Hemoglobin: 7.7 g/dL — ABNORMAL LOW (ref 13.0–17.0)
MCH: 28.3 pg (ref 26.0–34.0)
MCHC: 34.4 g/dL (ref 30.0–36.0)
MCV: 82.4 fL (ref 80.0–100.0)
Platelets: 160 10*3/uL (ref 150–400)
RBC: 2.72 MIL/uL — ABNORMAL LOW (ref 4.22–5.81)
RDW: 21.1 % — ABNORMAL HIGH (ref 11.5–15.5)
WBC: 128.6 10*3/uL (ref 4.0–10.5)
nRBC: 0.4 % — ABNORMAL HIGH (ref 0.0–0.2)

## 2020-08-09 LAB — GLUCOSE, CAPILLARY
Glucose-Capillary: 103 mg/dL — ABNORMAL HIGH (ref 70–99)
Glucose-Capillary: 157 mg/dL — ABNORMAL HIGH (ref 70–99)

## 2020-08-09 MED ORDER — DIPHENHYDRAMINE HCL 25 MG PO CAPS
25.0000 mg | ORAL_CAPSULE | Freq: Once | ORAL | Status: AC
  Administered 2020-08-09: 25 mg via ORAL
  Filled 2020-08-09: qty 1

## 2020-08-09 MED ORDER — SODIUM ZIRCONIUM CYCLOSILICATE 10 G PO PACK
10.0000 g | PACK | Freq: Once | ORAL | Status: AC
  Administered 2020-08-09: 10 g via ORAL
  Filled 2020-08-09 (×2): qty 1

## 2020-08-09 NOTE — Discharge Instructions (Signed)
Follow-up with pulmonology, Dr. Erin Fulling as scheduled Recommend close follow-up with your rheumatologist for consideration of changing Enbrel to a different medication for your rheumatoid arthritis as this medication has been associated with progression of your interstitial lung disease Discontinue prednisone Follow-up with cardiology as scheduled

## 2020-08-09 NOTE — Progress Notes (Signed)
Progress Note  Patient Name: Paul [name] Date of Encounter: 08/09/2020  [facility] Cardiologist: Lauree Chandler, MD    Subjective   80 year old gentleman with a history of myelodysplastic syndrome.  He has known coronary artery disease and is followed by Dr. Angelena Form.  He started having some episodes of chest pain with shortness of breath recently.  Dr. Angelena Form feels that this is a noncardiac pain. Lexiscan Myoview study on July 14, 2020 was low risk.  There was no evidence of ischemia.  He has a fixed inferior defect.  Has pulmonary fibrosis   Inpatient Medications    Scheduled Meds:  amLODipine  5 mg Oral Daily   aspirin  81 mg Oral Daily   clopidogrel  75 mg Oral Daily   enoxaparin (LOVENOX) injection  40 mg Subcutaneous Q24H   febuxostat  40 mg Oral Daily   fluticasone furoate-vilanterol  1 puff Inhalation Daily   And   umeclidinium bromide  1 puff Inhalation Daily   insulin aspart  0-9 Units Subcutaneous TID WC   isosorbide mononitrate  90 mg Oral Daily   leflunomide  20 mg Oral Daily   metoprolol tartrate  25 mg Oral BID   multivitamin with minerals  1 tablet Oral Daily   nicotine  14 mg Transdermal Daily   predniSONE  40 mg Oral Q breakfast   simvastatin  20 mg Oral QPM   sodium chloride flush  3 mL Intravenous Q12H   Continuous Infusions:  sodium chloride     PRN Meds: sodium chloride, acetaminophen **OR** acetaminophen, benzonatate, menthol-cetylpyridinium, nitroGLYCERIN, sodium chloride flush   Vital Signs    Vitals:   08/09/20 0500 08/09/20 0733 08/09/20 0819 08/09/20 1002  BP: (!) 155/51  140/65 (!) 162/47  Pulse: (!) 59 61 75 67  Resp: 18 16 18    Temp: 97.6 F (36.4 C)  97.7 F (36.5 C)   TempSrc: Oral  Oral   SpO2: 94%  99%   Weight: 73.6 kg     Height:        Intake/Output Summary (Last 24 hours) at 08/09/2020 1113 Last data filed at 08/09/2020 1042 Gross per 24 hour  Intake --  Output 650 ml  Net -650 ml   Last 3 Weights  08/09/2020 08/07/2020 08/07/2020  Weight (lbs) 162 lb 3.2 oz 157 lb 14.4 oz 158 lb  Weight (kg) 73.573 kg 71.623 kg 71.668 kg      Telemetry     NSR - Personally Reviewed  ECG     - Personally Reviewed  Physical Exam   GEN: elderly , chronically ill appearing man  Neck: No JVD Cardiac: RRR,    , soft systolic murmur  Respiratory:  fibrotic rales bilaterally  GI: Soft, nontender, non-distended  MS: No edema; No deformity. Neuro:  Nonfocal  Psych: Normal affect   Labs    High Sensitivity Troponin:   Recent Labs  Lab 08/07/20 1301 08/07/20 1458 08/07/20 2252  TROPONINIHS 9 11 10       Chemistry Recent Labs  Lab 08/07/20 1301 08/08/20 0234 08/09/20 0046  NA 128* 130* 126*  K 5.3* 5.0 5.5*  CL 102 103 101  CO2 20* 20* 22  GLUCOSE 274* 219* 124*  BUN 33* 36* 46*  CREATININE 1.52* 1.55* 1.69*  CALCIUM 8.6* 9.0 8.4*  PROT 7.8 7.8  --   ALBUMIN 2.7* 2.8*  --   AST 24 27  --   ALT 19 19  --   ALKPHOS 91 83  --  BILITOT 1.1 0.8  --   GFRNONAA 47* 46* 41*  ANIONGAP 6 7 3*     Hematology Recent Labs  Lab 08/07/20 1301 08/08/20 0234 08/09/20 0046  WBC 71.8* 104.4* 128.6*  RBC 3.43* 3.24* 2.72*  HGB 9.4* 9.2* 7.7*  HCT 28.6* 26.7* 22.4*  MCV 83.4 82.4 82.4  MCH 27.4 28.4 28.3  MCHC 32.9 34.5 34.4  RDW 21.1* 20.9* 21.1*  PLT 221 234 160    BNP Recent Labs  Lab 08/07/20 1301  BNP 634.6*     DDimer No results for input(s): DDIMER in the last 168 hours.   Radiology    DG Chest 2 View  Result Date: 08/07/2020 CLINICAL DATA:  Shortness of breath. EXAM: CHEST - 2 VIEW COMPARISON:  CT 07/14/2020.  Chest x-ray 12/01/2019. FINDINGS: Stable mediastinal prominence consistent with mediastinal fat again noted. Stable cardiomegaly. No pulmonary venous congestion. Chronic interstitial changes are again noted. Superimposed active interstitial process including interstitial edema and or pneumonitis cannot be excluded. Small bilateral pleural effusions are noted.  Stable right apical pleural thickening consistent scarring. No pneumothorax. Degenerative change thoracic spine. IMPRESSION: 1.  Stable cardiomegaly.  No pulmonary venous congestion. 2. Chronic interstitial changes are again noted. Superimposed active interstitial process including interstitial edema and or pneumonitis cannot be excluded. Small bilateral pleural effusions are noted. Electronically Signed   By: Marcello Moores  Register   On: 08/07/2020 13:19    Cardiac Studies      Patient Profile     80 y.o. male with pulmonary fibrosis CP Known occluded RCA   Assessment & Plan    Coronary artery disease: The patient is well-known to Dr. Angelena Form.  He has been having these episodes of chest pain or shortness of breath for months.  Echocardiogram in May, 2022 revealed normal LV function.  Myoview study revealed no evidence of ischemia but revealed a fixed inferior defect.  At this point I do not think that he needs any additional ischemic work-up.  Continue medical therapy.  He is stable for discharge from a cardiology standpoint.    [facility] will sign off.   Medication Recommendations:  cont current meds  Other recommendations (labs, testing, etc):   Follow up as an outpatient:  Dr. Angelena Form or APP in several weeks   For questions or updates, please contact [place] Please consult [email or web address] for contact info under        Signed, Mertie Moores, MD  08/09/2020, 11:13 AM

## 2020-08-09 NOTE — Discharge Summary (Signed)
Physician Discharge Summary  Paul [name] INO:[id number] DOB: [birth year] DOA: 08/07/2020  PCP: Denita Lung, MD  Admit date: 08/07/2020 Discharge date: 08/09/2020  Admitted From: Home Disposition: Home  Recommendations for Outpatient Follow-up:  Follow up with PCP in 1-2 weeks Follow-up with pulmonology, Dr. Erin Fulling as scheduled Recommend close follow-up with rheumatology for consideration of changing rheumatoid arthritis regimen from Enbrel to none TNF medication as this has been associated with progression of interval interstitial lung disease Discontinue prednisone due to worsening leukocytosis Continue to encourage tobacco cessation, currently patient unwilling Please obtain BMP in one week to assess sodium, potassium and renal function Recommend obtaining CBC 1 week to assess WBC count Would recommend initiation of goals of care discussions given advanced age in the setting of progressive interstitial lung disease  Home Health: None, refused PT services while inpatient Equipment/Devices: None  Discharge Condition: Stable CODE STATUS: Full code Diet recommendation: Heart healthy diet  History of present illness:  Paul [name] is a 80 year old male with past medical history significant for MDS/MPN, CAD, DM2, bilateral carotid artery disease, essential hypertension, hyperlipidemia, anemia of chronic disease, history of DVT, pulmonary fibrosis, RA, tobacco use disorder, AAA, CKD who presents to [facility] ED with chest pain.  Patient reports chest pain localized to his entire anterior chest wall without radiation to his arm or jaw.  Patient states pain lasted for roughly 15-20 minutes and relieved with nitroglycerin.  Associated also with shortness of breath.  Denies any diaphoresis or reproducible pain.  Reports legs have been swelling for the last 2 days but denies any weight gain.  No fever/chills, no sick contacts.  Patient was recently seen by his pulmonologist, and given prolonged  steroid taper for possible pulmonary fibrosis flare.   In the ED, temperature 97.9 F, HR 75, RR 30, BP 178/56, SPO2 100% on 3 L nasal cannula.  Sodium 128, potassium 5.3, chloride 102, CO2 20, glucose 274, BUN 33, creatinine 1.52, AST 24, ALT 19, total bilirubin 1.1.  WBC 71.8, hemoglobin 9.4, MCV 83.4, platelets 221.  BNP 634.6.  High sensitive troponin 9.  COVID-19 PCR negative.  Influenza A/B PCR negative.  Chest x-ray with stable cardiomegaly, no pulmonary venous congestion.  Chronic interstitial changes with superimposed interstitial process including interstitial edema versus pneumonitis with small bilateral pleural effusions.  EKG, NSR with rate 62, LVH, nonspecific ST changes with no evidence of acute ischemia/dynamic changes.  Cardiology consulted.  TRH consulted for further evaluation and management of shortness of breath, chest pain and significant leukocytosis.  Hospital course:  Atypical chest pain Patient presenting with chest pain localized to his anterior chest without radiation, worse with exertion.  Known history of CAD with normal stress test June 2022.  History of RCA occlusion not amenable to PCI.  EKG without ischemic changes and troponin negative x2.  Seen by cardiology, does not believe his symptoms are of cardiac etiology.  Continue aspirin, Plavix, beta-blocker, statin, Imdur.  Outpatient follow-up with cardiology as scheduled.   Acute on chronic diastolic congestive heart failure Patient presenting with shortness of breath, BNP elevated.  Notable pitting edema bilateral lower extremities.  Recent TTE May 2022 with LVEF 55%.  Received 2 doses IV diuretics while inpatient with improvement of symptoms.  Cardiology with no further recommendations.  Outpatient follow-up.  Low-salt diet.   Leukocytosis in the setting of MDS/MPN Upon presentation to the ED, patient was noted to have an elevated WBC count of 71.8; which is significantly changed since 07/17/2020 in which it  was 24.6.   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  Low suspicion for infectious process.  Oncology, Dr. Annamaria Boots was consulted.  Peripheral smear reviewed by pathology and oncology with no blasts seen, elevated WBC count likely related to recent steroid initiation by pulmonology.  Seen by pulmonology, recommend discontinuing prednisone at this time.  Recommend CBC 1 week.  Outpatient follow-up with oncology.   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 Erin Fulling.  Outpatient PFTs pending. Recently started on long prednisone taper; prednisone 40 mg daily x10 days followed by 20 mg daily x10 days followed by 10 mg daily.  Patient was seen by pulmonology inpatient, recommend discontinuing prednisone given significant rise in white blood cell count since initiation.  Continue Breztri inhaler. Outpatient follow-up with pulmonology.  Has PFTs scheduled in August.  Also recommend close follow-up with rheumatology regarding Enbrel use as this is associated with progression of interstitial lung disease.  Patient was ambulated on room air during hospitalization with desaturation down to 90% with quick recovery.   Coronary artery disease Left heart catheterization 2010 with complete total occlusion RCA with moderate disease of LAD and left circumflex.  Recent stress test 07/2020 negative for ischemia.  Continue isosorbide mononitrate, aspirin, Plavix, statin, beta-blocker.   Type 2 diabetes mellitus Hemoglobin A1c 4.878 2022, well controlled.  Diet controlled at home.   CKD stage IIIa Creatinine 1.52 on admission.  Baseline 1.3-1.6.,  Stable.  BMP 1 week.   Anemia of chronic disease Followed by hematology, likely secondary to underlying MDS.  Receives Retacrit infusions every 2 weeks.  Hemoglobin 9.2, stable.   Hyperlipidemia: Simvastatin 20 mg p.o. daily   Essential  hypertension BP 120/91, well controlled. Amlodipine 10 mg p.o. daily, Metoprolol tartrate 25 mg p.o. twice daily, Isosorbide mononitrate 60mg  PO daily.   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  Continue annual imaging with CTA/MRA   AAA Abdominal ultrasound 06/2019 with 4.1 cm infrarenal AAA, mildly enlarged since 2017.  Continue outpatient follow-up with PCP.   Hyperkalemia Potassium 5.3 on admission, received 1 dose of IV Lasix and Lokelma.  Repeat BMP 1 week.   Rheumatoid arthritis On Enbrel injection once weekly, leflunomide 20 mg p.o. daily.  Needs close follow-up with rheumatology for consideration of switching to a 9 TNF agent given this could be associated with progression of his interstitial lung disease.  Discharge Diagnoses:  Active Problems:   CAD (coronary artery disease)   Anemia of chronic disease   Shortness of breath   Hyperlipidemia associated with type 2 diabetes mellitus ([name])   Type 2 diabetes mellitus with diabetic neuropathy, without long-term current use of insulin ([name])   MDS/MPN (myelodysplastic/myeloproliferative neoplasms) ([place])   Current smoker   Hyponatremia   Pulmonary fibrosis ([name])   CKD (chronic kidney disease), stage III [facility])    Discharge Instructions  Discharge Instructions     Call MD for:   Complete by: As directed    Call MD for:  difficulty breathing, headache or visual disturbances   Complete by: As directed    Call MD for:  extreme fatigue   Complete by: As directed    Call MD for:  persistant dizziness or light-headedness   Complete by: As directed    Call MD for:  persistant nausea and vomiting   Complete by: As directed    Call  MD for:  redness, tenderness, or signs of infection (pain, swelling, redness, odor or green/yellow discharge around incision site)   Complete by: As directed    Call MD for:  temperature >100.4   Complete by: As directed    Diet  - low sodium heart healthy   Complete by: As directed    Increase activity slowly   Complete by: As directed       Allergies as of 08/09/2020       Reactions   Allopurinol Other (See Comments)   thrombocytopenia   Metformin And Related Diarrhea        Medication List     STOP taking these medications    predniSONE 10 MG tablet Commonly known as: DELTASONE       TAKE these medications    amLODipine 5 MG tablet Commonly known as: NORVASC TAKE 1 TABLET BY MOUTH  DAILY   aspirin 81 MG chewable tablet Chew 81 mg by mouth daily.   Breztri Aerosphere 160-9-4.8 MCG/ACT Aero Generic drug: Budeson-Glycopyrrol-Formoterol Inhale 2 puffs into the lungs in the morning and at bedtime.   Centrum tablet Take 1 tablet by mouth daily.   clopidogrel 75 MG tablet Commonly known as: PLAVIX TAKE 1 TABLET BY MOUTH  DAILY   Enbrel SureClick 50 MG/ML injection Generic drug: etanercept Inject 50 mg into the skin once a week.   febuxostat 40 MG tablet Commonly known as: ULORIC Take 40 mg by mouth daily.   fish oil-omega-3 fatty acids 1000 MG capsule Take 1 g by mouth daily.   isosorbide mononitrate 60 MG 24 hr tablet Commonly known as: IMDUR Take 1 tablet (60 mg total) by mouth at bedtime.   leflunomide 20 MG tablet Commonly known as: ARAVA Take 20 mg by mouth daily.   metoprolol tartrate 25 MG tablet Commonly known as: LOPRESSOR TAKE 1 TABLET BY MOUTH  TWICE DAILY   nitroGLYCERIN 0.4 MG SL tablet Commonly known as: NITROSTAT Place 0.4 mg under the tongue every 5 (five) minutes as needed for chest pain. For 3 doses   pyridoxine 100 MG tablet Commonly known as: B-6 Take 100 mg by mouth daily.   simvastatin 20 MG tablet Commonly known as: ZOCOR TAKE 1 TABLET BY MOUTH AT  BEDTIME What changed: when to take this        Follow-up Information     Denita Lung, MD Follow up in 1 week(s).   Specialty: Family Medicine Contact information: [place] [address] [phone]         Burnell Blanks, MD Follow up.   Specialty: Cardiology Contact information: [address] [place] [address] [phone]         Freddi Starr, MD Follow up.   Specialty: Pulmonary Disease Contact information: [address] [place] [address] [phone]                Allergies  Allergen Reactions   Allopurinol Other (See Comments)    thrombocytopenia   Metformin And Related Diarrhea    Consultations: Cardiology Oncology/hematology, Dr. Burr Medico PCCM, Dr. Carlis Abbott   Procedures/Studies: DG Chest 2 View  Result Date: 08/07/2020 CLINICAL DATA:  Shortness of breath. EXAM: CHEST - 2 VIEW COMPARISON:  CT 07/14/2020.  Chest x-ray 12/01/2019. FINDINGS: Stable mediastinal prominence consistent with mediastinal fat again noted. Stable cardiomegaly. No pulmonary venous congestion. Chronic interstitial changes are again noted. Superimposed active interstitial process including interstitial edema and or pneumonitis  cannot be excluded. Small bilateral pleural effusions are noted. Stable right apical pleural thickening consistent scarring. No pneumothorax. Degenerative change thoracic spine. IMPRESSION: 1.  Stable cardiomegaly.  No pulmonary venous congestion. 2. Chronic interstitial changes are again noted. Superimposed active interstitial process including interstitial edema and or pneumonitis cannot be excluded. Small bilateral pleural effusions are noted. Electronically Signed   By: Marcello Moores  Register   On: 08/07/2020 13:19   CT Chest Wo Contrast  Result Date: 07/14/2020 CLINICAL DATA:  Dyspnea. History of interstitial lung disease and thoracic adenopathy. EXAM: CT CHEST WITHOUT CONTRAST TECHNIQUE: Multidetector CT imaging of the chest was performed following the standard protocol without IV contrast. COMPARISON:  12/01/2019 chest CT angiogram. 11/03/2017 high-resolution chest CT. FINDINGS:  Cardiovascular: Top-normal heart size. No significant pericardial effusion/thickening. Left main and 3 vessel coronary atherosclerosis. Atherosclerotic thoracic aorta with dilated 4.1 cm ascending thoracic aorta. Top-normal caliber main pulmonary artery (3.1 cm diameter). Mediastinum/Nodes: No discrete thyroid nodules. Unremarkable esophagus. No axillary adenopathy. Mild right paratracheal adenopathy up to 1.1 cm short axis diameter (series 2/image 38), stable. Coarsely calcified nonenlarged right paratracheal, subcarinal and right hilar nodes, compatible with prior granulomatous disease, unchanged. No new pathologically enlarged mediastinal or discrete hilar nodes on these noncontrast images. Lungs/Pleura: No pneumothorax. Stable calcified bilateral pleural plaques. Small dependent bilateral pleural effusions, left greater than right, similar. Peripheral right middle lobe solid 5 mm pulmonary nodule (series 3/image 106), new. No acute consolidative airspace disease, lung masses or additional significant pulmonary nodules. Stable calcified subcentimeter granuloma in the superior segment right lower lobe. Prominent patchy confluent subpleural reticulation and ground-glass opacity throughout both lungs with associated mild traction bronchiectasis and architectural distortion. There is a basilar predominance to these findings. Scattered mild honeycombing at both lung bases. Findings appear mildly progressive since 11/03/2017 high-resolution chest CT study. Upper abdomen: Granulomatous calcifications throughout the liver and spleen, unchanged. Musculoskeletal: No aggressive appearing focal osseous lesions. Marked thoracic spondylosis. Mild chronic T12 vertebral compression fracture. IMPRESSION: 1. Bilateral calcified pleural plaques and small bilateral pleural effusions, compatible with asbestos related pleural disease. 2. Spectrum of findings compatible with basilar predominant fibrotic interstitial lung disease with  mild honeycombing, mildly progressed since 2019 high-resolution chest CT study. 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 Findings are consistent with UIP per consensus guidelines: Diagnosis of Idiopathic Pulmonary Fibrosis: An Official ATS/ERS/JRS/ALAT Clinical Practice Guideline. [place], Iss 5, [phone], Oct 02 2016. 3. New 5 mm right middle lobe solid pulmonary nodule. Follow-up chest CT recommended in 6 months in this high risk patient. 4. Dilated 4.1 cm ascending thoracic aorta. Recommend annual imaging followup by CTA or MRA. This recommendation follows 2010 ACCF/AHA/AATS/ACR/ASA/SCA/SCAI/SIR/STS/SVM Guidelines for the Diagnosis and Management of Patients with Thoracic Aortic Disease. Circulation. 2010; 121: Y782-N562. Aortic aneurysm NOS (ICD10-I71.9). 5. Stable mild mediastinal lymphadenopathy, nonspecific, probably reactive. 6. Left main and 3 vessel coronary atherosclerosis. 7. Aortic Atherosclerosis (ICD10-I70.0). Electronically Signed   By: Ilona Sorrel M.D.   On: 07/14/2020 15:24   MYOCARDIAL PERFUSION IMAGING  Result Date: 07/14/2020  The left ventricular ejection fraction is normal (55-65%).  Nuclear stress EF: 61%.  There was no ST segment deviation noted during stress.  There is a small defect of moderate severity present in the basal inferior, mid inferior and apical inferior location. The defect is non-reversible. In the setting of normal LVF, this is most consistent with diaphragmatic attenuation artifact. No ischemia noted.  This is a low risk study.  Subjective: Patient seen examined bedside, resting comfortably.  No specific complaints this morning.  Pathology from blood smear negative for blasts, oncology concerned leukocytosis related to recent initiation of prednisone.  Seen by PCCM and recommend discontinuing prednisone and outpatient follow-up with pulmonology and rheumatology; especially regarding Enbrel use.  Updated patient  regarding these concerns.  He has no other questions or concerns at this time.  Denies headache, no fever/chills/night sweats, no chest pain, no palpitations, no shortness of breath more than his typical baseline, no abdominal pain, no nausea/vomiting/diarrhea.  Patient refused PT evaluation while inpatient.  Discharging home.  No acute concerns per nursing staff.  Discharge Exam: Vitals:   08/09/20 1002 08/09/20 1148  BP: (!) 162/47 (!) 153/64  Pulse: 67 63  Resp:  16  Temp:  98.4 F (36.9 C)  SpO2:  100%   Vitals:   08/09/20 0733 08/09/20 0819 08/09/20 1002 08/09/20 1148  BP:  140/65 (!) 162/47 (!) 153/64  Pulse: 61 75 67 63  Resp: 16 18  16   Temp:  97.7 F (36.5 C)  98.4 F (36.9 C)  TempSrc:  Oral  Oral  SpO2:  99%  100%  Weight:      Height:        General: Pt is alert, awake, not in acute distress, elderly in appearance Cardiovascular: RRR, S1/S2 +, no rubs, no gallops Respiratory: CTA bilaterally, no wheezing, no rhonchi, on room air  Abdominal: Soft, NT, ND, bowel sounds + Extremities: no edema, no cyanosis    The results of significant diagnostics from this hospitalization (including imaging, microbiology, ancillary and laboratory) are listed below for reference.     Microbiology: Recent Results (from the past 240 hour(s))  Resp Panel by RT-PCR (Flu A&B, Covid) Nasopharyngeal Swab     Status: None   Collection Time: 08/07/20  1:01 PM   Specimen: Nasopharyngeal Swab; Nasopharyngeal(NP) swabs in vial transport medium  Result Value Ref Range Status   SARS Coronavirus 2 by RT PCR NEGATIVE NEGATIVE Final    Comment: (NOTE) SARS-CoV-2 target nucleic acids are NOT DETECTED.  The SARS-CoV-2 RNA is generally detectable in upper respiratory specimens during the acute phase of infection. The lowest concentration of SARS-CoV-2 viral copies this assay can detect is 138 copies/mL. A negative result does not preclude SARS-Cov-2 infection and should not be used as the sole  basis for treatment or other patient management decisions. A negative result may occur with  improper specimen collection/handling, submission of specimen other than nasopharyngeal swab, presence of viral mutation(s) within the areas targeted by this assay, and inadequate number of viral copies(<138 copies/mL). A negative result must be combined with clinical observations, patient history, and epidemiological information. The expected result is Negative.  Fact Sheet for Patients:  EntrepreneurPulse.com.au  Fact Sheet for Healthcare Providers:  IncredibleEmployment.be  This test is no t yet approved or cleared by the [place] FDA and  has been authorized for detection and/or diagnosis of SARS-CoV-2 by FDA under an Emergency Use Authorization (EUA). This EUA will remain  in effect (meaning this test can be used) for the duration of the COVID-19 declaration under Section 564(b)(1) of the Act, 21 U.S.C.section 360bbb-3(b)(1), unless the authorization is terminated  or revoked sooner.       Influenza A by PCR NEGATIVE NEGATIVE Final   Influenza B by PCR NEGATIVE NEGATIVE Final    Comment: (NOTE) The Xpert Xpress SARS-CoV-2/FLU/RSV plus assay is intended as an aid in the diagnosis of influenza from Nasopharyngeal swab  specimens and should not be used as a sole basis for treatment. Nasal washings and aspirates are unacceptable for Xpert Xpress SARS-CoV-2/FLU/RSV testing.  Fact Sheet for Patients: EntrepreneurPulse.com.au  Fact Sheet for Healthcare Providers: IncredibleEmployment.be  This test is not yet approved or cleared by the [place] FDA and has been authorized for detection and/or diagnosis of SARS-CoV-2 by FDA under an Emergency Use Authorization (EUA). This EUA will remain in effect (meaning this test can be used) for the duration of the COVID-19 declaration under Section 564(b)(1) of the Act,  21 U.S.C. section 360bbb-3(b)(1), unless the authorization is terminated or revoked.  Performed at [facility], [place] [address] [place], [place] [address]      Labs: BNP (last 3 results) Recent Labs    12/02/19 1034 08/07/20 1301  BNP 610.7* 400.8*   Basic Metabolic Panel: Recent Labs  Lab 08/07/20 1301 08/08/20 0234 08/09/20 0046  NA 128* 130* 126*  K 5.3* 5.0 5.5*  CL 102 103 101  CO2 20* 20* 22  GLUCOSE 274* 219* 124*  BUN 33* 36* 46*  CREATININE 1.52* 1.55* 1.69*  CALCIUM 8.6* 9.0 8.4*   Liver Function Tests: Recent Labs  Lab 08/07/20 1301 08/08/20 0234  AST 24 27  ALT 19 19  ALKPHOS 91 83  BILITOT 1.1 0.8  PROT 7.8 7.8  ALBUMIN 2.7* 2.8*   No results for input(s): LIPASE, AMYLASE in the last 168 hours. No results for input(s): AMMONIA in the last 168 hours. CBC: Recent Labs  Lab 08/07/20 1301 08/08/20 0234 08/09/20 0046  WBC 71.8* 104.4* 128.6*  NEUTROABS 51.6* 77.2*  --   HGB 9.4* 9.2* 7.7*  HCT 28.6* 26.7* 22.4*  MCV 83.4 82.4 82.4  PLT 221 234 160   Cardiac Enzymes: No results for input(s): CKTOTAL, CKMB, CKMBINDEX, TROPONINI in the last 168 hours. BNP: Invalid input(s): POCBNP CBG: Recent Labs  Lab 08/08/20 1144 08/08/20 1649 08/08/20 2056 08/09/20 0757 08/09/20 1149  GLUCAP 154* 157* 183* 103* 157*   D-Dimer No results for input(s): DDIMER in the last 72 hours. Hgb A1c Recent Labs    08/08/20 0234  HGBA1C 4.8   Lipid Profile No results for input(s): CHOL, HDL, LDLCALC, TRIG, CHOLHDL, LDLDIRECT in the last 72 hours. Thyroid function studies No results for input(s): TSH, T4TOTAL, T3FREE, THYROIDAB in the last 72 hours.  Invalid input(s): FREET3 Anemia work up No results for input(s): VITAMINB12, FOLATE, FERRITIN, TIBC, IRON, RETICCTPCT in the last 72 hours. Urinalysis    Component Value Date/Time   COLORURINE YELLOW 12/02/2019 1034   APPEARANCEUR CLEAR 12/02/2019 1034   APPEARANCEUR Clear 09/27/2017 1520    LABSPEC 1.024 12/02/2019 1034   PHURINE 5.0 12/02/2019 1034   GLUCOSEU NEGATIVE 12/02/2019 1034   HGBUR SMALL (A) 12/02/2019 1034   BILIRUBINUR NEGATIVE 12/02/2019 1034   BILIRUBINUR Negative 09/27/2017 [place] 12/02/2019 1034   PROTEINUR 30 (A) 12/02/2019 1034   UROBILINOGEN negative 11/17/2015 0911   NITRITE NEGATIVE 12/02/2019 1034   LEUKOCYTESUR NEGATIVE 12/02/2019 1034   Sepsis Labs Invalid input(s): PROCALCITONIN,  WBC,  LACTICIDVEN Microbiology Recent Results (from the past 240 hour(s))  Resp Panel by RT-PCR (Flu A&B, Covid) Nasopharyngeal Swab     Status: None   Collection Time: 08/07/20  1:01 PM   Specimen: Nasopharyngeal Swab; Nasopharyngeal(NP) swabs in vial transport medium  Result Value Ref Range Status   SARS Coronavirus 2 by RT PCR NEGATIVE NEGATIVE Final    Comment: (NOTE) SARS-CoV-2 target nucleic acids are  NOT DETECTED.  The SARS-CoV-2 RNA is generally detectable in upper respiratory specimens during the acute phase of infection. The lowest concentration of SARS-CoV-2 viral copies this assay can detect is 138 copies/mL. A negative result does not preclude SARS-Cov-2 infection and should not be used as the sole basis for treatment or other patient management decisions. A negative result may occur with  improper specimen collection/handling, submission of specimen other than nasopharyngeal swab, presence of viral mutation(s) within the areas targeted by this assay, and inadequate number of viral copies(<138 copies/mL). A negative result must be combined with clinical observations, patient history, and epidemiological information. The expected result is Negative.  Fact Sheet for Patients:  EntrepreneurPulse.com.au  Fact Sheet for Healthcare Providers:  IncredibleEmployment.be  This test is no t yet approved or cleared by the [place] FDA and  has been authorized for detection and/or diagnosis of  SARS-CoV-2 by FDA under an Emergency Use Authorization (EUA). This EUA will remain  in effect (meaning this test can be used) for the duration of the COVID-19 declaration under Section 564(b)(1) of the Act, 21 U.S.C.section 360bbb-3(b)(1), unless the authorization is terminated  or revoked sooner.       Influenza A by PCR NEGATIVE NEGATIVE Final   Influenza B by PCR NEGATIVE NEGATIVE Final    Comment: (NOTE) The Xpert Xpress SARS-CoV-2/FLU/RSV plus assay is intended as an aid in the diagnosis of influenza from Nasopharyngeal swab specimens and should not be used as a sole basis for treatment. Nasal washings and aspirates are unacceptable for Xpert Xpress SARS-CoV-2/FLU/RSV testing.  Fact Sheet for Patients: EntrepreneurPulse.com.au  Fact Sheet for Healthcare Providers: IncredibleEmployment.be  This test is not yet approved or cleared by the [place] FDA and has been authorized for detection and/or diagnosis of SARS-CoV-2 by FDA under an Emergency Use Authorization (EUA). This EUA will remain in effect (meaning this test can be used) for the duration of the COVID-19 declaration under Section 564(b)(1) of the Act, 21 U.S.C. section [id number](b)(1), unless the authorization is terminated or revoked.  Performed at [facility], [place] [address] [place],  [address]      Time coordinating discharge: Over 30 minutes  SIGNED:   Haeley Fordham J [place] DO  [facility] 08/09/2020, 1:55 PM

## 2020-08-09 NOTE — TOC Initial Note (Signed)
Transition of Care [facility]) - Initial/Assessment Note    Patient Details  Name: Paul [name] MRN: [id number] Date of Birth: [birth year]  Transition of Care [facility]) CM/SW Contact:    [name], RN Phone Number: 08/09/2020, 2:27 PM  Clinical Narrative:                 Patient is for dc today, no needs.  Expected Discharge Plan: Home/Self Care Barriers to Discharge: No Barriers Identified   Patient Goals and CMS Choice        Expected Discharge Plan and Services Expected Discharge Plan: Home/Self Care   Discharge Planning Services: CM Consult   Living arrangements for the past 2 months: Single Family Home Expected Discharge Date: 08/09/20                 DME Agency: NA       HH Arranged: NA          Prior Living Arrangements/Services Living arrangements for the past 2 months: Single Family Home Lives with:: Spouse                   Activities of Daily Living Home Assistive Devices/Equipment: None ADL Screening (condition at time of admission) Patient's cognitive ability adequate to safely complete daily activities?: Yes Is the patient deaf or have difficulty hearing?: No Does the patient have difficulty seeing, even when wearing glasses/contacts?: No Does the patient have difficulty concentrating, remembering, or making decisions?: No Patient able to express need for assistance with ADLs?: No Does the patient have difficulty dressing or bathing?: No Independently performs ADLs?: Yes (appropriate for developmental age) Does the patient have difficulty walking or climbing stairs?: Yes Weakness of Legs: Both Weakness of Arms/Hands: None  Permission Sought/Granted                  Emotional Assessment       Orientation: : Oriented to Situation, Oriented to  Time, Oriented to Place, Oriented to Self Alcohol / Substance Use: Not Applicable Psych Involvement: No (comment)  Admission diagnosis:  Pancytopenia ([place]) [D61.818] Chest pain  [R07.9] Chest pain, unspecified type [R07.9] Patient Active Problem List   Diagnosis Date Noted   CKD (chronic kidney disease), stage III ([name]) 08/07/2020   Chronic low back pain without sciatica 12/10/2019   Rheumatoid arthritis ([name]) 12/10/2019   Pulmonary fibrosis ([name]) 12/10/2019   Aortic atherosclerosis ([name]) 12/10/2019   CAP (community acquired pneumonia) 12/01/2019   Hyponatremia 12/01/2019   Onychomycosis 03/13/2019   History of amputation of great toe ([name]) 03/08/2019   Current smoker 01/17/2019   MDS/MPN (myelodysplastic/myeloproliferative neoplasms) ([name]) 01/02/2018   Unintentional weight loss 09/27/2017   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   Hyperlipidemia associated with type 2 diabetes mellitus ([name]) 01/21/2017   Type 2 diabetes mellitus with diabetic neuropathy, without long-term current use of insulin ([name]) 01/21/2017   Diabetic nephropathy associated with type 2 diabetes mellitus ([name]) 01/21/2017   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   Shortness of breath    Thrombocytopenia due to drugs    Allergy status to unspecified drugs, medicaments and biological substances status    AAA (abdominal aortic aneurysm) ([name])-infrarenal 3.4 cm 12/04/2015   Vitamin B12 deficiency 04/22/2015   Anemia of chronic disease 04/18/2015   Hypertension complicating diabetes ([name]) 07/22/2010   CAD (coronary artery disease) 09/17/2008   PCP:  Denita Lung, MD Pharmacy:   [facility] [id number] - [place], [place] - [address] [place] [address] Phone: [phone] Fax: [phone]  [facility]  ([place], [address] [place] [address] Phone: [phone] Fax: [phone]     Social Determinants of Health (SDOH) Interventions    Readmission Risk Interventions Readmission Risk Prevention Plan 08/09/2020  Transportation Screening Complete   HRI or Home Care Consult Complete  Social Work Consult for [place] Planning/Counseling Complete  Palliative Care Screening Not Applicable  Medication Review Press photographer) Complete  Some recent data might be hidden

## 2020-08-09 NOTE — Progress Notes (Signed)
SATURATION QUALIFICATIONS: (This note is used to comply with regulatory documentation for home oxygen)  Patient Saturations on Room Air at Rest = 100%  Patient Saturations on Room Air while Ambulating = 90%  Patient Saturations on 0 Liters of oxygen while Ambulating = 90%  Please briefly explain why patient needs home oxygen: Patient ambulated 170 feet down hallway the lowest his O2 sats got was 90% he was very short of breath and refused to ambulate further. Refused PT when they came by said he was too tired after already ambulating.

## 2020-08-09 NOTE — Progress Notes (Signed)
PT Cancellation Note  Patient Details Name: Paul [name] MRN: [id number] DOB: Mar 10, 1940   Cancelled Treatment:    Reason Eval/Treat Not Completed: Other (comment) (PT declined PT)  Pt reports he has walked in the hallways with nursing and is at his baseline level of mobility. He did not want PT services while in the hospital. He rports SOB limits his activity at home and that is not new for him.  PT to sign off.         Melvern Banker 08/09/2020, 11:14 AM Lavonia Dana, PT   Acute Rehabilitation Services  Pager [phone] Office [phone] 08/09/2020

## 2020-08-09 NOTE — Consult Note (Signed)
NAME:  Paul [name], MRN:  [id number], DOB:  1940/09/15, LOS: 2 ADMISSION DATE:  08/07/2020, CONSULTATION DATE:  08/09/2020 REFERRING MD:  Dr. [place] CHIEF COMPLAINT:  Worsening SOB   History of Present Illness:  Paul [name] is a 80 y.o. male with a PMH significant for but not limited to pulmonary fibrosis, CAD, myelodysplastic disease, CKD stage 3, type 2 diabetes, HTN. HLD, and AAA who presented to the ED 7/7 for complaints of sudden onset chest pain unrelieved by sublingual nitro. Also reported mild increased shortness of breath that began a few weeks before admission. Patient was admitted for chest pain and SOB workup per [facility] with cardiology consult  Given little improvement of symptoms with steroids PCCM was consulted for further management.  Of note was seen in pulmonary clinic this week for pulmonary fibrosis workup. He was started on steroid taper and reports no acute issues or missed doses,   Pertinent  Medical History  Significant for but not limited to:  Pulmonary fibrosis, daily smoker, CAD, myelodysplastic disease, CKD stage 3, type 2 diabetes, Diastolic CHF, HTN. HLD, and AAA  Significant Hospital Events:  7/7 Admitted for chest pain and SOB  Interim History / Subjective:  Sitting up on edge of bed with no acute complaints   Objective   Blood pressure (!) 162/47, pulse 67, temperature 97.7 F (36.5 C), temperature source Oral, resp. rate 18, height 5' 10.5" (1.791 m), weight 73.6 kg, SpO2 99 %.        Intake/Output Summary (Last 24 hours) at 08/09/2020 1044 Last data filed at 08/09/2020 1042 Gross per 24 hour  Intake --  Output 650 ml  Net -650 ml   Filed Weights   08/07/20 1150 08/07/20 2030 08/09/20 0500  Weight: 71.7 kg 71.6 kg 73.6 kg    Examination: General: Chronically ill appearing thin elderly male sitting up in bed, in NAD HEENT: [place]/AT, MM pink/moist, PERRL,  Neuro: Alert and oriented x3, non-focal  CV: s1s2 regular rate and rhythm, no murmur,  rubs, or gallops,  PULM:  Deferred to MD GI: soft, bowel sounds active in all 4 quadrants, non-tender, non-distended, Extremities: warm/dry, no edema  Skin: no rashes or lesions  Resolved Hospital Problem list     Assessment & Plan:  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 States he smoked from age [age], quit from age [age] when resumed smoking  Small bilateral pleural effusion -Seen on CT P: Stop steroid taper  Pulmonary hygiene  Mobilize as able  Smoking cessation education provided  Consider anti-fibrotic medication initiation will discuss with primary Pulmonologist Dr. Erin Fulling  Effusion too small to safely tap  Continue BDs Pulmonary function test pending   Best Practice:   Diet/type: Regular consistency (see orders) DVT prophylaxis: LMWH GI prophylaxis: PPI Lines: N/A Foley:  N/A Code Status:  full code Last date of multidisciplinary goals of care discussion [place] primary   Labs   CBC: Recent Labs  Lab 08/07/20 1301 08/08/20 0234 08/09/20 0046  WBC 71.8* 104.4* 128.6*  NEUTROABS 51.6* 77.2*  --   HGB 9.4* 9.2* 7.7*  HCT 28.6* 26.7* 22.4*  MCV 83.4 82.4 82.4  PLT 221 234 659    Basic Metabolic Panel: Recent Labs  Lab 08/07/20 1301 08/08/20 0234 08/09/20 0046  NA 128* 130* 126*  K 5.3* 5.0 5.5*  CL 102 103 101  CO2 20* 20* 22  GLUCOSE 274* 219* 124*  BUN 33* 36* 46*  CREATININE 1.52* 1.55* 1.69*  CALCIUM 8.6* 9.0 8.4*   GFR: Estimated Creatinine Clearance: 37.5 mL/min (A) (by C-G formula based on SCr of 1.69 mg/dL (H)). Recent Labs  Lab 08/07/20 1301 08/08/20 0234 08/09/20 0046  WBC 71.8* 104.4* 128.6*    Liver Function Tests: Recent Labs  Lab 08/07/20 1301 08/08/20 0234  AST 24 27  ALT 19 19  ALKPHOS 91 83  BILITOT 1.1 0.8   PROT 7.8 7.8  ALBUMIN 2.7* 2.8*   No results for input(s): LIPASE, AMYLASE in the last 168 hours. No results for input(s): AMMONIA in the last 168 hours.  ABG No results found for: PHART, PCO2ART, PO2ART, HCO3, TCO2, ACIDBASEDEF, O2SAT   Coagulation Profile: No results for input(s): INR, PROTIME in the last 168 hours.  Cardiac Enzymes: No results for input(s): CKTOTAL, CKMB, CKMBINDEX, TROPONINI in the last 168 hours.  HbA1C: Hemoglobin A1C  Date/Time Value Ref Range Status  04/25/2015 12:00 AM 7.7  Final   Hgb A1c MFr Bld  Date/Time Value Ref Range Status  08/08/2020 02:34 AM 4.8 4.8 - 5.6 % Final    Comment:    (NOTE) Pre diabetes:          5.7%-6.4%  Diabetes:              >6.4%  Glycemic control for   <7.0% adults with diabetes   12/02/2019 10:34 AM 4.7 (L) 4.8 - 5.6 % Final    Comment:    (NOTE) Pre diabetes:          5.7%-6.4%  Diabetes:              >6.4%  Glycemic control for   <7.0% adults with diabetes     CBG: Recent Labs  Lab 08/08/20 0827 08/08/20 1144 08/08/20 1649 08/08/20 2056 08/09/20 0757  GLUCAP 139* 154* 157* 183* 103*    Review of Systems:   Please see the history of present illness. All other systems reviewed and are negative   Past Medical History:  He,  has a past medical history of AAA (abdominal aortic aneurysm) ([place]), Abnormal LFTs, AKI (acute kidney injury) ([place]), Anemia of chronic disease, Arthritis, Atherosclerosis of native coronary artery of native heart without angina pectoris, CAD (coronary artery disease), Chronic back pain, Constipation, Coronary atherosclerosis of native coronary artery, Dehydration, Diabetes mellitus, Diarrhea, Elevated liver enzymes, Generalized weakness, Gout of right hand, Hiatal hernia, History of gastroesophageal reflux (GERD), History of stomach ulcers, Hyperlipidemia, Hypertension, Metabolic acidosis, Normochromic anemia (02/23/2016), Polyclonal gammopathy (05/24/2016), Thrombocytopenia ([name]),  Unintentional weight loss (09/27/2017), Vitamin B12 deficiency, Vitamin B12 deficiency, Vitamin D deficiency, and Weakness.   Surgical History:   Past Surgical History:  Procedure Laterality Date   ANKLE FRACTURE SURGERY Left    "crushed"   BACK SURGERY  X 3   "fell 4 stories in 1977"   [place] Left    CORONARY ANGIOPLASTY WITH STENT PLACEMENT  09/26/2008   [name] 06/03/2010   FRACTURE SURGERY     LEFT HEART CATHETERIZATION WITH CORONARY ANGIOGRAM N/A 01/21/2012   Procedure: LEFT HEART CATHETERIZATION WITH CORONARY ANGIOGRAM;  Surgeon: Burnell Blanks, MD;  Location: [facility] CATH LAB;  Service: Cardiovascular;  Laterality: N/A;   [place]; ~ [place]  ~ 1998   "3rd OR to reconstruct my back after fall in  1977"   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 and 2nd     Social History:   reports that he has been smoking cigars and cigarettes. He started smoking about 61 years ago. He has a 10.00 pack-year smoking history. He has never used smokeless tobacco. He reports previous alcohol use. He reports that he does not use drugs.   Family History:  His family history includes Cancer in his mother; Heart attack in his mother and sister; Heart disease in his mother and sister.   Allergies Allergies  Allergen Reactions   Allopurinol Other (See Comments)    thrombocytopenia   Metformin And Related Diarrhea     Home Medications  Prior to Admission medications   Medication Sig Start Date End Date Taking? Authorizing Provider  amLODipine (NORVASC) 5 MG tablet TAKE 1 TABLET BY MOUTH  DAILY Patient taking differently: Take 5 mg by mouth daily. 01/24/20  Yes Burnell Blanks, MD  aspirin 81 MG chewable tablet Chew 81 mg by mouth daily.   Yes [provider]  Budeson-Glycopyrrol-Formoterol (BREZTRI AEROSPHERE) 160-9-4.8 MCG/ACT AERO Inhale 2 puffs into the lungs in the morning and at bedtime. 08/06/20  Yes Freddi Starr, MD   clopidogrel (PLAVIX) 75 MG tablet TAKE 1 TABLET BY MOUTH  DAILY Patient taking differently: Take 75 mg by mouth daily. 01/18/20  Yes Denita Lung, MD  ENBREL SURECLICK 50 MG/ML injection Inject 50 mg into the skin once a week.  04/26/19  Yes [provider]  febuxostat (ULORIC) 40 MG tablet Take 40 mg by mouth daily.   Yes [provider]  fish oil-omega-3 fatty acids 1000 MG capsule Take 1 g by mouth daily.   Yes [provider]  isosorbide mononitrate (IMDUR) 60 MG 24 hr tablet Take 1 tablet (60 mg total) by mouth at bedtime. 06/27/20  Yes Burnell Blanks, MD  leflunomide (ARAVA) 20 MG tablet Take 20 mg by mouth daily. 05/21/19  Yes [provider]  metoprolol tartrate (LOPRESSOR) 25 MG tablet TAKE 1 TABLET BY MOUTH  TWICE DAILY Patient taking differently: Take 25 mg by mouth 2 (two) times daily. 07/07/20  Yes Burnell Blanks, MD  Multiple Vitamins-Minerals (CENTRUM) tablet Take 1 tablet by mouth daily.   Yes [provider]  nitroGLYCERIN (NITROSTAT) 0.4 MG SL tablet Place 0.4 mg under the tongue every 5 (five) minutes as needed for chest pain. For 3 doses 10/12/11  Yes Denita Lung, MD  predniSONE (DELTASONE) 10 MG tablet Take 1 tablet (10 mg total) by mouth daily with breakfast. Patient taking differently: Take 40 mg by mouth daily with breakfast. 08/06/20  Yes Freddi Starr, MD  pyridoxine (B-6) 100 MG tablet Take 100 mg by mouth daily.   Yes [provider]  simvastatin (ZOCOR) 20 MG tablet TAKE 1 TABLET BY MOUTH AT  BEDTIME Patient taking differently: Take 20 mg by mouth every evening. 01/24/20  Yes Burnell Blanks, MD     Critical care time: N/A  Rozanne Heumann D. Kenton Kingfisher, NP-C [facility] Personal contact information can be found on Amion  08/09/2020, 11:21 AM

## 2020-08-11 ENCOUNTER — Telehealth: Payer: Self-pay | Admitting: Cardiovascular Disease

## 2020-08-11 NOTE — Telephone Encounter (Signed)
   Pt's sister calling, she is with the pt., she said pt. was in the hospital this weekend due to chest pain, pt. was seen by Dr. Angelena Form at the hospital, they were told need f/u with Dr. Angelena Form or APP, first available is in November and the pt. said he needs to be seen sooner

## 2020-08-12 ENCOUNTER — Ambulatory Visit: Payer: Self-pay | Admitting: Pulmonary Disease

## 2020-08-12 DIAGNOSIS — J8410 Pulmonary fibrosis, unspecified: Secondary | ICD-10-CM | POA: Diagnosis not present

## 2020-08-14 ENCOUNTER — Emergency Department (HOSPITAL_COMMUNITY): Payer: Medicare Other

## 2020-08-14 ENCOUNTER — Other Ambulatory Visit: Payer: Medicare Other

## 2020-08-14 ENCOUNTER — Other Ambulatory Visit: Payer: Self-pay

## 2020-08-14 ENCOUNTER — Encounter (HOSPITAL_COMMUNITY): Payer: Self-pay | Admitting: Emergency Medicine

## 2020-08-14 ENCOUNTER — Ambulatory Visit: Payer: Medicare Other

## 2020-08-14 ENCOUNTER — Emergency Department (HOSPITAL_BASED_OUTPATIENT_CLINIC_OR_DEPARTMENT_OTHER): Payer: Medicare Other

## 2020-08-14 ENCOUNTER — Inpatient Hospital Stay (HOSPITAL_COMMUNITY)
Admission: EM | Admit: 2020-08-14 | Discharge: 2020-09-01 | DRG: 811 | Disposition: E | Payer: Medicare Other | Attending: Internal Medicine | Admitting: Internal Medicine

## 2020-08-14 ENCOUNTER — Ambulatory Visit: Payer: Medicare Other | Admitting: Hematology

## 2020-08-14 DIAGNOSIS — R627 Adult failure to thrive: Secondary | ICD-10-CM | POA: Diagnosis present

## 2020-08-14 DIAGNOSIS — I5032 Chronic diastolic (congestive) heart failure: Secondary | ICD-10-CM | POA: Diagnosis present

## 2020-08-14 DIAGNOSIS — E883 Tumor lysis syndrome: Secondary | ICD-10-CM | POA: Diagnosis present

## 2020-08-14 DIAGNOSIS — I77810 Thoracic aortic ectasia: Secondary | ICD-10-CM | POA: Diagnosis present

## 2020-08-14 DIAGNOSIS — R600 Localized edema: Secondary | ICD-10-CM | POA: Insufficient documentation

## 2020-08-14 DIAGNOSIS — I517 Cardiomegaly: Secondary | ICD-10-CM | POA: Diagnosis not present

## 2020-08-14 DIAGNOSIS — M7989 Other specified soft tissue disorders: Secondary | ICD-10-CM

## 2020-08-14 DIAGNOSIS — R6889 Other general symptoms and signs: Secondary | ICD-10-CM | POA: Diagnosis not present

## 2020-08-14 DIAGNOSIS — E1140 Type 2 diabetes mellitus with diabetic neuropathy, unspecified: Secondary | ICD-10-CM | POA: Diagnosis not present

## 2020-08-14 DIAGNOSIS — I499 Cardiac arrhythmia, unspecified: Secondary | ICD-10-CM | POA: Diagnosis not present

## 2020-08-14 DIAGNOSIS — I130 Hypertensive heart and chronic kidney disease with heart failure and stage 1 through stage 4 chronic kidney disease, or unspecified chronic kidney disease: Secondary | ICD-10-CM | POA: Diagnosis not present

## 2020-08-14 DIAGNOSIS — Z515 Encounter for palliative care: Secondary | ICD-10-CM

## 2020-08-14 DIAGNOSIS — R0609 Other forms of dyspnea: Secondary | ICD-10-CM | POA: Diagnosis not present

## 2020-08-14 DIAGNOSIS — I152 Hypertension secondary to endocrine disorders: Secondary | ICD-10-CM

## 2020-08-14 DIAGNOSIS — E875 Hyperkalemia: Secondary | ICD-10-CM | POA: Diagnosis not present

## 2020-08-14 DIAGNOSIS — D631 Anemia in chronic kidney disease: Secondary | ICD-10-CM | POA: Diagnosis not present

## 2020-08-14 DIAGNOSIS — Z8249 Family history of ischemic heart disease and other diseases of the circulatory system: Secondary | ICD-10-CM

## 2020-08-14 DIAGNOSIS — F17210 Nicotine dependence, cigarettes, uncomplicated: Secondary | ICD-10-CM | POA: Diagnosis present

## 2020-08-14 DIAGNOSIS — E876 Hypokalemia: Secondary | ICD-10-CM | POA: Diagnosis not present

## 2020-08-14 DIAGNOSIS — J90 Pleural effusion, not elsewhere classified: Secondary | ICD-10-CM | POA: Diagnosis not present

## 2020-08-14 DIAGNOSIS — Z66 Do not resuscitate: Secondary | ICD-10-CM | POA: Diagnosis not present

## 2020-08-14 DIAGNOSIS — D649 Anemia, unspecified: Secondary | ICD-10-CM | POA: Diagnosis not present

## 2020-08-14 DIAGNOSIS — J8410 Pulmonary fibrosis, unspecified: Secondary | ICD-10-CM | POA: Diagnosis present

## 2020-08-14 DIAGNOSIS — M109 Gout, unspecified: Secondary | ICD-10-CM | POA: Diagnosis not present

## 2020-08-14 DIAGNOSIS — Z86718 Personal history of other venous thrombosis and embolism: Secondary | ICD-10-CM

## 2020-08-14 DIAGNOSIS — I358 Other nonrheumatic aortic valve disorders: Secondary | ICD-10-CM | POA: Diagnosis present

## 2020-08-14 DIAGNOSIS — E1122 Type 2 diabetes mellitus with diabetic chronic kidney disease: Secondary | ICD-10-CM | POA: Diagnosis present

## 2020-08-14 DIAGNOSIS — D469 Myelodysplastic syndrome, unspecified: Secondary | ICD-10-CM | POA: Diagnosis not present

## 2020-08-14 DIAGNOSIS — M069 Rheumatoid arthritis, unspecified: Secondary | ICD-10-CM | POA: Diagnosis present

## 2020-08-14 DIAGNOSIS — Z6821 Body mass index (BMI) 21.0-21.9, adult: Secondary | ICD-10-CM

## 2020-08-14 DIAGNOSIS — N1831 Chronic kidney disease, stage 3a: Secondary | ICD-10-CM | POA: Diagnosis not present

## 2020-08-14 DIAGNOSIS — Z7189 Other specified counseling: Secondary | ICD-10-CM | POA: Diagnosis not present

## 2020-08-14 DIAGNOSIS — G8929 Other chronic pain: Secondary | ICD-10-CM | POA: Diagnosis present

## 2020-08-14 DIAGNOSIS — I714 Abdominal aortic aneurysm, without rupture, unspecified: Secondary | ICD-10-CM | POA: Diagnosis present

## 2020-08-14 DIAGNOSIS — R609 Edema, unspecified: Secondary | ICD-10-CM | POA: Insufficient documentation

## 2020-08-14 DIAGNOSIS — Z809 Family history of malignant neoplasm, unspecified: Secondary | ICD-10-CM

## 2020-08-14 DIAGNOSIS — Z7982 Long term (current) use of aspirin: Secondary | ICD-10-CM

## 2020-08-14 DIAGNOSIS — I700 Atherosclerosis of aorta: Secondary | ICD-10-CM | POA: Diagnosis not present

## 2020-08-14 DIAGNOSIS — E871 Hypo-osmolality and hyponatremia: Secondary | ICD-10-CM | POA: Diagnosis not present

## 2020-08-14 DIAGNOSIS — R0789 Other chest pain: Secondary | ICD-10-CM | POA: Diagnosis not present

## 2020-08-14 DIAGNOSIS — D72829 Elevated white blood cell count, unspecified: Secondary | ICD-10-CM | POA: Diagnosis not present

## 2020-08-14 DIAGNOSIS — R911 Solitary pulmonary nodule: Secondary | ICD-10-CM | POA: Diagnosis not present

## 2020-08-14 DIAGNOSIS — K219 Gastro-esophageal reflux disease without esophagitis: Secondary | ICD-10-CM | POA: Diagnosis not present

## 2020-08-14 DIAGNOSIS — G253 Myoclonus: Secondary | ICD-10-CM | POA: Diagnosis not present

## 2020-08-14 DIAGNOSIS — R079 Chest pain, unspecified: Secondary | ICD-10-CM | POA: Diagnosis present

## 2020-08-14 DIAGNOSIS — Z743 Need for continuous supervision: Secondary | ICD-10-CM | POA: Diagnosis not present

## 2020-08-14 DIAGNOSIS — R6251 Failure to thrive (child): Secondary | ICD-10-CM | POA: Diagnosis not present

## 2020-08-14 DIAGNOSIS — R0602 Shortness of breath: Secondary | ICD-10-CM

## 2020-08-14 DIAGNOSIS — I2510 Atherosclerotic heart disease of native coronary artery without angina pectoris: Secondary | ICD-10-CM | POA: Diagnosis not present

## 2020-08-14 DIAGNOSIS — Z955 Presence of coronary angioplasty implant and graft: Secondary | ICD-10-CM

## 2020-08-14 DIAGNOSIS — N179 Acute kidney failure, unspecified: Secondary | ICD-10-CM | POA: Diagnosis not present

## 2020-08-14 DIAGNOSIS — E1159 Type 2 diabetes mellitus with other circulatory complications: Secondary | ICD-10-CM | POA: Diagnosis present

## 2020-08-14 DIAGNOSIS — D696 Thrombocytopenia, unspecified: Secondary | ICD-10-CM | POA: Diagnosis present

## 2020-08-14 DIAGNOSIS — D638 Anemia in other chronic diseases classified elsewhere: Secondary | ICD-10-CM | POA: Diagnosis present

## 2020-08-14 DIAGNOSIS — M545 Low back pain, unspecified: Secondary | ICD-10-CM | POA: Diagnosis present

## 2020-08-14 DIAGNOSIS — Z981 Arthrodesis status: Secondary | ICD-10-CM

## 2020-08-14 DIAGNOSIS — R451 Restlessness and agitation: Secondary | ICD-10-CM | POA: Diagnosis not present

## 2020-08-14 DIAGNOSIS — Z7902 Long term (current) use of antithrombotics/antiplatelets: Secondary | ICD-10-CM

## 2020-08-14 DIAGNOSIS — M549 Dorsalgia, unspecified: Secondary | ICD-10-CM | POA: Diagnosis not present

## 2020-08-14 DIAGNOSIS — E785 Hyperlipidemia, unspecified: Secondary | ICD-10-CM | POA: Diagnosis present

## 2020-08-14 DIAGNOSIS — Z888 Allergy status to other drugs, medicaments and biological substances status: Secondary | ICD-10-CM

## 2020-08-14 DIAGNOSIS — N1832 Chronic kidney disease, stage 3b: Secondary | ICD-10-CM | POA: Diagnosis not present

## 2020-08-14 DIAGNOSIS — Z20822 Contact with and (suspected) exposure to covid-19: Secondary | ICD-10-CM | POA: Diagnosis present

## 2020-08-14 DIAGNOSIS — Z79899 Other long term (current) drug therapy: Secondary | ICD-10-CM

## 2020-08-14 DIAGNOSIS — Z7951 Long term (current) use of inhaled steroids: Secondary | ICD-10-CM

## 2020-08-14 LAB — CBC
HCT: 24 % — ABNORMAL LOW (ref 39.0–52.0)
Hemoglobin: 8 g/dL — ABNORMAL LOW (ref 13.0–17.0)
MCH: 28 pg (ref 26.0–34.0)
MCHC: 33.3 g/dL (ref 30.0–36.0)
MCV: 83.9 fL (ref 80.0–100.0)
Platelets: 106 10*3/uL — ABNORMAL LOW (ref 150–400)
RBC: 2.86 MIL/uL — ABNORMAL LOW (ref 4.22–5.81)
RDW: 21.5 % — ABNORMAL HIGH (ref 11.5–15.5)
WBC: 131.5 10*3/uL (ref 4.0–10.5)
nRBC: 0.2 % (ref 0.0–0.2)

## 2020-08-14 LAB — BASIC METABOLIC PANEL
Anion gap: 7 (ref 5–15)
BUN: 39 mg/dL — ABNORMAL HIGH (ref 8–23)
CO2: 21 mmol/L — ABNORMAL LOW (ref 22–32)
Calcium: 8.5 mg/dL — ABNORMAL LOW (ref 8.9–10.3)
Chloride: 102 mmol/L (ref 98–111)
Creatinine, Ser: 1.47 mg/dL — ABNORMAL HIGH (ref 0.61–1.24)
GFR, Estimated: 49 mL/min — ABNORMAL LOW (ref >60)
Glucose, Bld: 131 mg/dL — ABNORMAL HIGH (ref 70–99)
Potassium: 4.7 mmol/L (ref 3.5–5.1)
Sodium: 130 mmol/L — ABNORMAL LOW (ref 135–145)

## 2020-08-14 LAB — TROPONIN I (HIGH SENSITIVITY)
Troponin I (High Sensitivity): 10 ng/L (ref <18)
Troponin I (High Sensitivity): 12 ng/L (ref <18)
Troponin I (High Sensitivity): 10 ng/L (ref <18)
Troponin I (High Sensitivity): 8 ng/L (ref <18)

## 2020-08-14 LAB — D-DIMER, QUANTITATIVE: D-Dimer, Quant: 1.47 ug/mL-FEU — ABNORMAL HIGH (ref 0.00–0.50)

## 2020-08-14 MED ORDER — MORPHINE SULFATE (PF) 4 MG/ML IV SOLN
4.0000 mg | Freq: Once | INTRAVENOUS | Status: AC
  Administered 2020-08-14: 4 mg via INTRAVENOUS
  Filled 2020-08-14: qty 1

## 2020-08-14 MED ORDER — ONDANSETRON HCL 4 MG/2ML IJ SOLN
4.0000 mg | Freq: Once | INTRAMUSCULAR | Status: AC
Start: 2020-08-14 — End: 2020-08-14
  Administered 2020-08-14: 4 mg via INTRAVENOUS
  Filled 2020-08-14: qty 2

## 2020-08-14 MED ORDER — AMLODIPINE BESYLATE 5 MG PO TABS
5.0000 mg | ORAL_TABLET | Freq: Every day | ORAL | Status: DC
  Administered 2020-08-15 – 2020-08-22 (×8): 5 mg via ORAL
  Filled 2020-08-14 (×8): qty 1

## 2020-08-14 MED ORDER — METOPROLOL TARTRATE 25 MG PO TABS
25.0000 mg | ORAL_TABLET | Freq: Two times a day (BID) | ORAL | Status: DC
  Administered 2020-08-15 – 2020-08-20 (×11): 25 mg via ORAL
  Filled 2020-08-14 (×12): qty 1

## 2020-08-14 MED ORDER — FEBUXOSTAT 40 MG PO TABS
40.0000 mg | ORAL_TABLET | Freq: Every day | ORAL | Status: DC
  Administered 2020-08-15 – 2020-08-22 (×8): 40 mg via ORAL
  Filled 2020-08-14 (×9): qty 1

## 2020-08-14 MED ORDER — SODIUM CHLORIDE 0.9 % IV SOLN: INTRAVENOUS | Status: DC

## 2020-08-14 MED ORDER — ISOSORBIDE MONONITRATE ER 60 MG PO TB24
60.0000 mg | ORAL_TABLET | Freq: Every day | ORAL | Status: DC
  Administered 2020-08-14 – 2020-08-22 (×9): 60 mg via ORAL
  Filled 2020-08-14 (×9): qty 1

## 2020-08-14 MED ORDER — BUDESON-GLYCOPYRROL-FORMOTEROL 160-9-4.8 MCG/ACT IN AERO: 2.0000 | INHALATION_SPRAY | Freq: Two times a day (BID) | RESPIRATORY_TRACT | Status: DC

## 2020-08-14 MED ORDER — SENNOSIDES-DOCUSATE SODIUM 8.6-50 MG PO TABS
1.0000 | ORAL_TABLET | Freq: Every evening | ORAL | Status: DC | PRN
  Administered 2020-08-19: 1 via ORAL
  Filled 2020-08-14: qty 1

## 2020-08-14 MED ORDER — OXYCODONE HCL 5 MG PO TABS
5.0000 mg | ORAL_TABLET | ORAL | Status: DC | PRN
  Administered 2020-08-15 – 2020-08-16 (×4): 5 mg via ORAL
  Filled 2020-08-14 (×4): qty 1

## 2020-08-14 MED ORDER — ACETAMINOPHEN 650 MG RE SUPP: 650.0000 mg | Freq: Four times a day (QID) | RECTAL | Status: DC | PRN

## 2020-08-14 MED ORDER — ASPIRIN 81 MG PO CHEW
81.0000 mg | CHEWABLE_TABLET | Freq: Every day | ORAL | Status: DC
  Administered 2020-08-15 – 2020-08-18 (×4): 81 mg via ORAL
  Filled 2020-08-14 (×4): qty 1

## 2020-08-14 MED ORDER — SIMVASTATIN 20 MG PO TABS
20.0000 mg | ORAL_TABLET | Freq: Every day | ORAL | Status: DC
  Administered 2020-08-14 – 2020-08-22 (×9): 20 mg via ORAL
  Filled 2020-08-14 (×9): qty 1

## 2020-08-14 MED ORDER — UMECLIDINIUM BROMIDE 62.5 MCG/INH IN AEPB
1.0000 | INHALATION_SPRAY | Freq: Every day | RESPIRATORY_TRACT | Status: DC
  Administered 2020-08-15 – 2020-08-23 (×8): 1 via RESPIRATORY_TRACT
  Filled 2020-08-14 (×2): qty 7

## 2020-08-14 MED ORDER — MORPHINE SULFATE (PF) 4 MG/ML IV SOLN
3.0000 mg | INTRAVENOUS | Status: AC | PRN
Start: 2020-08-14 — End: 2020-08-16
  Administered 2020-08-15 – 2020-08-16 (×3): 3 mg via INTRAVENOUS
  Filled 2020-08-14 (×3): qty 1

## 2020-08-14 MED ORDER — ACETAMINOPHEN 325 MG PO TABS
650.0000 mg | ORAL_TABLET | Freq: Four times a day (QID) | ORAL | Status: DC | PRN
  Administered 2020-08-19: 650 mg via ORAL
  Filled 2020-08-14 (×2): qty 2

## 2020-08-14 MED ORDER — LEFLUNOMIDE 20 MG PO TABS
20.0000 mg | ORAL_TABLET | Freq: Every day | ORAL | Status: DC
  Administered 2020-08-15 – 2020-08-22 (×8): 20 mg via ORAL
  Filled 2020-08-14 (×9): qty 1

## 2020-08-14 MED ORDER — FLUTICASONE FUROATE-VILANTEROL 200-25 MCG/INH IN AEPB
1.0000 | INHALATION_SPRAY | Freq: Every day | RESPIRATORY_TRACT | Status: DC
  Administered 2020-08-15 – 2020-08-23 (×8): 1 via RESPIRATORY_TRACT
  Filled 2020-08-14: qty 28

## 2020-08-14 MED ORDER — ONDANSETRON HCL 4 MG PO TABS: 4.0000 mg | ORAL_TABLET | Freq: Four times a day (QID) | ORAL | Status: DC | PRN

## 2020-08-14 MED ORDER — ONDANSETRON HCL 4 MG/2ML IJ SOLN: 4.0000 mg | Freq: Four times a day (QID) | INTRAMUSCULAR | Status: DC | PRN

## 2020-08-14 MED ORDER — IOHEXOL 350 MG/ML SOLN
50.0000 mL | Freq: Once | INTRAVENOUS | Status: AC | PRN
  Administered 2020-08-14: 50 mL via INTRAVENOUS

## 2020-08-14 MED ORDER — ENOXAPARIN SODIUM 40 MG/0.4ML IJ SOSY
40.0000 mg | PREFILLED_SYRINGE | INTRAMUSCULAR | Status: DC
  Administered 2020-08-14 – 2020-08-20 (×7): 40 mg via SUBCUTANEOUS
  Filled 2020-08-14 (×7): qty 0.4

## 2020-08-14 MED ORDER — CLOPIDOGREL BISULFATE 75 MG PO TABS
75.0000 mg | ORAL_TABLET | Freq: Every day | ORAL | Status: DC
  Administered 2020-08-15 – 2020-08-18 (×4): 75 mg via ORAL
  Filled 2020-08-14 (×4): qty 1

## 2020-08-14 NOTE — ED Triage Notes (Addendum)
Pt arrives via EMS for CP/SOB from home. Pt seen on Saturday for the same. Pt seen by his cardiologist and per EMS, does not feel it is cardiac related. Pt recently diagnosed with Pulmonary Fibrosis. Pt states the pain worsens when he lays down across his chest into both shoulders. Pt has taken 4 nitro through the day today. Last dose around 1150. Pt also took 324mg  ASA at 1150.Pt's pain was 10/10 when he called EMS, has resolved to 1/10 without any interventions from EMS.

## 2020-08-14 NOTE — H&P (Signed)
History and Physical    DONIELLE RADZIEWICZ NLZ:[id number] DOB: [birth year] DOA: 08/08/2020  PCP: Denita Lung, MD   Patient coming from: Home   Chief Complaint: Chest pain   HPI: Paul [name] is a 80 y.o. male with medical history significant for CAD, pulmonary fibrosis, rheumatoid arthritis, CKD 3a, hypertension, infrarenal AAA, dilation of ascending thoracic aorta, MDS/MPN, now presenting to emergency department for evaluation of chest pain.  Patient was admitted to the hospital 1 week ago with chest pain, evaluated by cardiology with low suspicion for cardiac etiology.  He has continued to have frequent episodes of chest pain throughout the day and night since his discharge on 08/09/2020, seems to be worse when he lays down, involving the upper chest and shoulders, improved with nitroglycerin and sitting upright.  He has been sleeping in a recliner for the past few days due to the symptoms.  He denies any recent change in his exertional dyspnea and denies any fevers or chills.  He denies headache, change in vision, or focal numbness or weakness.  ED Course: Upon arrival to the ED, patient is found to be afebrile, saturating well on room air, and with stable blood pressure.  EKG features sinus rhythm with LVH and repolarization abnormality.  Bilateral lower extremity venous Dopplers were negative for DVT.  CTA chest with no change in bilateral pleural effusions and pulmonary fibrosis.  Chemistry panel notable for sodium of 130 and creatinine 1.47.  CBC with leukocytosis 231,500, hemoglobin 8.0, and platelets 106,000.  High-sensitivity troponin was normal x3.  Cardiology and oncology were consulted by the ED physician, patient was treated with multiple doses of morphine, COVID-19 screening test is pending, and hospitalists asked to admit.  Review of Systems:  All other systems reviewed and apart from HPI, are negative.  Past Medical History:  Diagnosis Date   AAA (abdominal aortic aneurysm)  ([name])    Abnormal LFTs    AKI (acute kidney injury) ([place])    Anemia of chronic disease    Arthritis    "all over" (12/04/2015)   Atherosclerosis of native coronary artery of native heart without angina pectoris    CAD (coronary artery disease)    with cath 09/26/08 showing severe heart disease in RCA with POBA, mild to mod diesease in circ and LAD   Chronic back pain    back problems prior to surgery    Constipation    Coronary atherosclerosis of native coronary artery    Dehydration    Diabetes mellitus    with neuropathy   Diarrhea    Elevated liver enzymes    Generalized weakness    Gout of right hand    Hiatal hernia    History of gastroesophageal reflux (GERD)    History of stomach ulcers    Hyperlipidemia    Hypertension    Metabolic acidosis    Normochromic anemia 02/23/2016   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   Thrombocytopenia ([place])    Unintentional weight loss 09/27/2017   33 lbs between 4/19 & 8/19   Vitamin B12 deficiency    Vitamin B12 deficiency    Vitamin D deficiency    Weakness     Past Surgical History:  Procedure Laterality Date   ANKLE FRACTURE SURGERY Left    "crushed"   BACK SURGERY  X 3   "fell 4 stories in 1977"   [place] WITH STENT PLACEMENT  09/26/2008   [name] 06/03/2010   FRACTURE SURGERY  LEFT HEART CATHETERIZATION WITH CORONARY ANGIOGRAM N/A 01/21/2012   Procedure: LEFT HEART CATHETERIZATION WITH CORONARY ANGIOGRAM;  Surgeon: Burnell Blanks, MD;  Location: [facility] CATH LAB;  Service: Cardiovascular;  Laterality: N/A;   [place]; ~ [place]  ~ 1998   "3rd OR to reconstruct my back after fall in 1977"   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 and 2nd    Social History:   reports that he has been smoking cigars and cigarettes. He started smoking about 61 years ago. He has a 10.00 pack-year smoking history. He has never used smokeless tobacco. He reports previous  alcohol use. He reports that he does not use drugs.  Allergies  Allergen Reactions   Allopurinol Other (See Comments)    thrombocytopenia   Metformin And Related Diarrhea    Family History  Problem Relation Age of Onset   Heart attack Mother    Cancer Mother    Heart disease Mother    Heart attack Sister    Heart disease Sister      Prior to Admission medications   Medication Sig Start Date End Date Taking? Authorizing Provider  amLODipine (NORVASC) 5 MG tablet TAKE 1 TABLET BY MOUTH  DAILY 01/24/20  Yes Burnell Blanks, MD  aspirin 81 MG chewable tablet Chew 81 mg by mouth daily.   Yes [provider]  Budeson-Glycopyrrol-Formoterol (BREZTRI AEROSPHERE) 160-9-4.8 MCG/ACT AERO Inhale 2 puffs into the lungs in the morning and at bedtime. 08/06/20  Yes Freddi Starr, MD  clopidogrel (PLAVIX) 75 MG tablet TAKE 1 TABLET BY MOUTH  DAILY 01/18/20  Yes Denita Lung, MD  ENBREL SURECLICK 50 MG/ML injection Inject 50 mg into the skin once a week.  04/26/19  Yes [provider]  fish oil-omega-3 fatty acids 1000 MG capsule Take 1 g by mouth daily.   Yes [provider]  isosorbide mononitrate (IMDUR) 60 MG 24 hr tablet Take 1 tablet (60 mg total) by mouth at bedtime. 06/27/20  Yes Burnell Blanks, MD  leflunomide (ARAVA) 20 MG tablet Take 20 mg by mouth daily. 05/21/19  Yes [provider]  metoprolol tartrate (LOPRESSOR) 25 MG tablet TAKE 1 TABLET BY MOUTH  TWICE DAILY 07/07/20  Yes Burnell Blanks, MD  Multiple Vitamins-Minerals (CENTRUM) tablet Take 1 tablet by mouth daily.   Yes [provider]  nitroGLYCERIN (NITROSTAT) 0.4 MG SL tablet Place 0.4 mg under the tongue every 5 (five) minutes as needed for chest pain. For 3 doses 10/12/11  Yes Denita Lung, MD  pyridoxine (B-6) 100 MG tablet Take 100 mg by mouth daily.   Yes [provider]  simvastatin (ZOCOR) 20 MG tablet TAKE 1 TABLET BY MOUTH AT  BEDTIME  01/24/20  Yes Burnell Blanks, MD  febuxostat (ULORIC) 40 MG tablet Take 40 mg by mouth daily.    [provider]    Physical Exam: Vitals:   08/17/2020 1445 08/06/2020 1500 08/08/2020 1630 08/01/2020 2000  BP: (!) 150/50 (!) 163/43 (!) 154/51 (!) 149/48  Pulse: 64 62 63 60  Resp: (!) 25 (!) 30 (!) 21 13  Temp:      TempSrc:      SpO2: 100% 97% 97% 100%  Weight:      Height:        Constitutional: NAD, calm  Eyes: PERTLA, lids and conjunctivae normal ENMT: Mucous membranes are moist. Posterior pharynx clear of any exudate or lesions.  Neck: supple, no masses  Respiratory:  no wheezing, diminished at bases. No accessory muscle use.  Cardiovascular: S1 & S2 heard, regular rate and rhythm. Pretibial pitting edema bilterally.   Abdomen: No distension, no tenderness, soft. Bowel sounds active.  Musculoskeletal: no clubbing / cyanosis. No joint deformity upper and lower extremities.   Skin: no significant rashes, lesions, ulcers. Warm, dry, well-perfused. Neurologic: CN 2-12 grossly intact. Sensation intact. Moving all extremities.  Psychiatric: Alert and oriented to person, place, and situation. Pleasant and cooperative.    Labs and Imaging on Admission: I have personally reviewed following labs and imaging studies  CBC: Recent Labs  Lab 08/08/20 0234 08/09/20 0046 08/09/2020 1257  WBC 104.4* 128.6* 131.5*  NEUTROABS 77.2*  --   --   HGB 9.2* 7.7* 8.0*  HCT 26.7* 22.4* 24.0*  MCV 82.4 82.4 83.9  PLT 234 160 712*   Basic Metabolic Panel: Recent Labs  Lab 08/08/20 0234 08/09/20 0046 08/16/2020 1257  NA 130* 126* 130*  K 5.0 5.5* 4.7  CL 103 101 102  CO2 20* 22 21*  GLUCOSE 219* 124* 131*  BUN 36* 46* 39*  CREATININE 1.55* 1.69* 1.47*  CALCIUM 9.0 8.4* 8.5*   GFR: Estimated Creatinine Clearance: 42.5 mL/min (A) (by C-G formula based on SCr of 1.47 mg/dL (H)). Liver Function Tests: Recent Labs  Lab 08/08/20 0234  AST 27  ALT 19  ALKPHOS 83  BILITOT  0.8  PROT 7.8  ALBUMIN 2.8*   No results for input(s): LIPASE, AMYLASE in the last 168 hours. No results for input(s): AMMONIA in the last 168 hours. Coagulation Profile: No results for input(s): INR, PROTIME in the last 168 hours. Cardiac Enzymes: No results for input(s): CKTOTAL, CKMB, CKMBINDEX, TROPONINI in the last 168 hours. BNP (last 3 results) No results for input(s): PROBNP in the last 8760 hours. HbA1C: No results for input(s): HGBA1C in the last 72 hours. CBG: Recent Labs  Lab 08/08/20 1144 08/08/20 1649 08/08/20 2056 08/09/20 0757 08/09/20 1149  GLUCAP 154* 157* 183* 103* 157*   Lipid Profile: No results for input(s): CHOL, HDL, LDLCALC, TRIG, CHOLHDL, LDLDIRECT in the last 72 hours. Thyroid Function Tests: No results for input(s): TSH, T4TOTAL, FREET4, T3FREE, THYROIDAB in the last 72 hours. Anemia Panel: No results for input(s): VITAMINB12, FOLATE, FERRITIN, TIBC, IRON, RETICCTPCT in the last 72 hours. Urine analysis:    Component Value Date/Time   COLORURINE YELLOW 12/02/2019 1034   APPEARANCEUR CLEAR 12/02/2019 1034   APPEARANCEUR Clear 09/27/2017 1520   LABSPEC 1.024 12/02/2019 1034   PHURINE 5.0 12/02/2019 1034   GLUCOSEU NEGATIVE 12/02/2019 1034   HGBUR SMALL (A) 12/02/2019 1034   BILIRUBINUR NEGATIVE 12/02/2019 1034   BILIRUBINUR Negative 09/27/2017 [place] 12/02/2019 1034   PROTEINUR 30 (A) 12/02/2019 1034   UROBILINOGEN negative 11/17/2015 0911   NITRITE NEGATIVE 12/02/2019 1034   LEUKOCYTESUR NEGATIVE 12/02/2019 1034   Sepsis Labs: @LABRCNTIP (procalcitonin:4,lacticidven:4) ) Recent Results (from the past 240 hour(s))  Resp Panel by RT-PCR (Flu A&B, Covid) Nasopharyngeal Swab     Status: None   Collection Time: 08/07/20  1:01 PM   Specimen: Nasopharyngeal Swab; Nasopharyngeal(NP) swabs in vial transport medium  Result Value Ref Range Status   SARS Coronavirus 2 by RT PCR NEGATIVE NEGATIVE Final    Comment:  (NOTE) SARS-CoV-2 target nucleic acids are NOT DETECTED.  The SARS-CoV-2 RNA is generally detectable in upper respiratory specimens during the acute phase of infection. The lowest concentration of SARS-CoV-2 viral copies  this assay can detect is 138 copies/mL. A negative result does not preclude SARS-Cov-2 infection and should not be used as the sole basis for treatment or other patient management decisions. A negative result may occur with  improper specimen collection/handling, submission of specimen other than nasopharyngeal swab, presence of viral mutation(s) within the areas targeted by this assay, and inadequate number of viral copies(<138 copies/mL). A negative result must be combined with clinical observations, patient history, and epidemiological information. The expected result is Negative.  Fact Sheet for Patients:  EntrepreneurPulse.com.au  Fact Sheet for Healthcare Providers:  IncredibleEmployment.be  This test is no t yet approved or cleared by the [place] FDA and  has been authorized for detection and/or diagnosis of SARS-CoV-2 by FDA under an Emergency Use Authorization (EUA). This EUA will remain  in effect (meaning this test can be used) for the duration of the COVID-19 declaration under Section 564(b)(1) of the Act, 21 U.S.C.section 360bbb-3(b)(1), unless the authorization is terminated  or revoked sooner.       Influenza A by PCR NEGATIVE NEGATIVE Final   Influenza B by PCR NEGATIVE NEGATIVE Final    Comment: (NOTE) The Xpert Xpress SARS-CoV-2/FLU/RSV plus assay is intended as an aid in the diagnosis of influenza from Nasopharyngeal swab specimens and should not be used as a sole basis for treatment. Nasal washings and aspirates are unacceptable for Xpert Xpress SARS-CoV-2/FLU/RSV testing.  Fact Sheet for Patients: EntrepreneurPulse.com.au  Fact Sheet for Healthcare  Providers: IncredibleEmployment.be  This test is not yet approved or cleared by the [place] FDA and has been authorized for detection and/or diagnosis of SARS-CoV-2 by FDA under an Emergency Use Authorization (EUA). This EUA will remain in effect (meaning this test can be used) for the duration of the COVID-19 declaration under Section 564(b)(1) of the Act, 21 U.S.C. section 360bbb-3(b)(1), unless the authorization is terminated or revoked.  Performed at [facility], [place] [address], [place], [place] [address]      Radiological Exams on Admission: CT Angio Chest PE W and/or Wo Contrast  Result Date: 08/03/2020 CLINICAL DATA:  Chest pain.  Shortness of breath. EXAM: CT ANGIOGRAPHY CHEST WITH CONTRAST TECHNIQUE: Multidetector CT imaging of the chest was performed using the standard protocol during bolus administration of intravenous contrast. Multiplanar CT image reconstructions and MIPs were obtained to evaluate the vascular anatomy. CONTRAST:  25mL OMNIPAQUE IOHEXOL 350 MG/ML SOLN COMPARISON:  Plain film of earlier today.  CT 07/14/2020. FINDINGS: Cardiovascular: The quality of this exam for evaluation of pulmonary embolism is good. No evidence of pulmonary embolism. Pulmonary artery enlargement, outflow tract 3.1 cm Aorta not well opacified. Aortic atherosclerosis. Tortuous thoracic aorta. Mild cardiomegaly. Three vessel coronary artery calcification. Mediastinum/Nodes: Prominent mediastinal nodes are again identified, favored to be reactive. Example at up to 1.1 cm in the high right paratracheal station. Lungs/Pleura: Small left greater than right pleural effusions are unchanged. Bilateral calcified pleural plaques. Right greater than left apical pleuroparenchymal scarring. 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 Basilar and peripheral predominant subpleural reticulation with scattered basilar honeycombing. Right lower lobe  calcified granuloma. Right middle lobe 5 mm pulmonary nodule is similar on 107/6. Upper Abdomen: Old granulomatous disease in the liver and spleen. Normal imaged portions of the stomach, gallbladder, pancreas, adrenal glands, kidneys. Abdominal aortic atherosclerosis. Musculoskeletal: Lower thoracic spondylosis with mild wedging of the T12 vertebral body. Review of the MIP images confirms the above findings. IMPRESSION: 1.  No evidence of pulmonary embolism.  2. No significant change compared to the CT of 07/14/2020. 3. Interstitial lung disease, consistent with asbestosis and asbestos related pleural disease. 4. Small bilateral pleural effusions. 5. Coronary artery atherosclerosis. Aortic Atherosclerosis (ICD10-I70.0). 6. Pulmonary artery enlargement suggests pulmonary arterial hypertension. Electronically Signed   By: Abigail Miyamoto M.D.   On: 08/07/2020 19:18   DG Chest Port 1 View  Result Date: 08/07/2020 CLINICAL DATA:  Chest pain and shortness of breath. Pulmonary fibrosis. EXAM: PORTABLE CHEST 1 VIEW COMPARISON:  08/07/2020 FINDINGS: Stable cardiomediastinal contours. Persistent bilateral pleural effusions identified. Chronic reticular interstitial opacities with a lower lung zone predominance is again seen compatible with diagnosis of pulmonary fibrosis. No superimposed airspace consolidation. IMPRESSION: No change in bilateral pleural effusions and pulmonary fibrosis. Electronically Signed   By: Kerby Moors M.D.   On: 08/12/2020 13:30   VAS [place] LOWER EXTREMITY VENOUS (DVT) (ONLY [facility] & [facility])  Result Date: 08/20/2020  Lower Venous DVT Study Patient Name:  JAXAN MICHEL  Date of Exam:   08/18/2020 Medical Rec #: [id number]           Accession #:    [id number] Date of Birth: 1940/12/28            Patient Gender: M Patient Age:   80Y Exam Location:  [facility] Procedure:      VAS [place] LOWER EXTREMITY VENOUS (DVT) Referring Phys: [id number] JULIE HAVILAND  --------------------------------------------------------------------------------  Indications: Swelling, and SOB.  Comparison Study: No prior study Performing Technologist: Maudry Mayhew MHA, RDMS, RVT, RDCS  Examination Guidelines: A complete evaluation includes B-mode imaging, spectral Doppler, color Doppler, and power Doppler as needed of all accessible portions of each vessel. Bilateral testing is considered an integral part of a complete examination. Limited examinations for reoccurring indications may be performed as noted. The reflux portion of the exam is performed with the patient in reverse Trendelenburg.  +---------+---------------+---------+-----------+----------+--------------+ RIGHT    CompressibilityPhasicitySpontaneityPropertiesThrombus Aging +---------+---------------+---------+-----------+----------+--------------+ CFV      Full           Yes      Yes                                 +---------+---------------+---------+-----------+----------+--------------+ SFJ      Full                                                        +---------+---------------+---------+-----------+----------+--------------+ FV Prox  Full                                                        +---------+---------------+---------+-----------+----------+--------------+ FV Mid   Full                                                        +---------+---------------+---------+-----------+----------+--------------+ FV DistalFull                                                        +---------+---------------+---------+-----------+----------+--------------+  PFV      Full                                                        +---------+---------------+---------+-----------+----------+--------------+ POP      Full           Yes      Yes                                 +---------+---------------+---------+-----------+----------+--------------+ PTV      Full                                                         +---------+---------------+---------+-----------+----------+--------------+ PERO     Full                                                        +---------+---------------+---------+-----------+----------+--------------+   +---------+---------------+---------+-----------+----------+--------------+ LEFT     CompressibilityPhasicitySpontaneityPropertiesThrombus Aging +---------+---------------+---------+-----------+----------+--------------+ CFV      Full           Yes      Yes                                 +---------+---------------+---------+-----------+----------+--------------+ SFJ      Full                                                        +---------+---------------+---------+-----------+----------+--------------+ FV Prox  Full                                                        +---------+---------------+---------+-----------+----------+--------------+ FV Mid   Full                                                        +---------+---------------+---------+-----------+----------+--------------+ FV DistalFull                                                        +---------+---------------+---------+-----------+----------+--------------+ PFV      Full                                                        +---------+---------------+---------+-----------+----------+--------------+  POP      Full           Yes      Yes                                 +---------+---------------+---------+-----------+----------+--------------+ PTV      Full                                                        +---------+---------------+---------+-----------+----------+--------------+ PERO     Full                                                        +---------+---------------+---------+-----------+----------+--------------+     Summary: BILATERAL: - No evidence of deep vein thrombosis seen in the  lower extremities, bilaterally. -No evidence of popliteal cyst, bilaterally.   *See table(s) above for measurements and observations. Electronically signed by Jamelle Haring on 08/11/2020 at 5:13:17 PM.    Final     EKG: Independently reviewed. Sinus rhythm, LVH with repolarization abnormality.   Assessment/Plan   1. Chest pain  - Patient with hx of CAD presents with recurrent chest pain  - Serial troponins negative in ED, no acute ischemic features on EKG, and CTA negative for PE or other acute findings  - Appreciate cardiology evaluation, concerned this is related to marked/increasing leukocytosis  - Continue supportive care, follow-up cardiology and hematology-oncology recommendations    2. CAD  - Nuclear stress test in June 2022 with no ischemia, low-risk study  - Continue ASA, Plavix, statin, metoprolol, Imdur    3. MDS/MPN  - WBC increased to 131,500; Hgb stable at 8.0, and platelets down to 106,000 on admission   - Hematology-oncology consulted by ED, will follow-up on their recommendations    4. Pulmonary fibrosis  - Has pulmonology follow-up planned for PFTs    5. Rheumatoid arthritis  - Pulmonology had recommended that he discuss with his rheumatologist the possibility of stopping Enbrel/switching to non-TNF blocking agent   - Continue leflunomide   6. CKD IIIa  - SCr is 1.47 on admission which appears to be his baseline  - Renally-dose medications, monitor    7. Hypertension  - Continue Norvasc and Lopressor    8. Leg swelling  - Venous US performed in ED and negative for DVTs  - EF was 55%, RV function was normal, and diastolic parameters normal on echo in May 2022  - Consider stopping Norvasc, compression socks     DVT prophylaxis: Lovenox  Code Status: Full  Level of Care: Level of care: Telemetry Cardiac Family Communication: none present  Disposition Plan:  Patient is from: home  Anticipated d/c is to: Home  Anticipated d/c date is: 7/15 or  08/16/20 Patient currently: Pending cardiology and heme-onc recommendations  Consults called: Cardiology, hematology-oncology  Admission status: Observation     Vianne Bulls, MD [facility]  08/16/2020, 9:56 PM

## 2020-08-14 NOTE — ED Notes (Signed)
Attempted report. Was told pt has not been approved and room is not cleaned.

## 2020-08-14 NOTE — Progress Notes (Signed)
Bilateral lower extremity venous duplex completed. Refer to "CV Proc" under chart review to view preliminary results.  08/13/2020 2:48 PM Kelby Aline., MHA, RVT, RDCS, RDMS

## 2020-08-14 NOTE — ED Provider Notes (Signed)
[facility] EMERGENCY DEPARTMENT Provider Note   CSN: [id number] Arrival date & time: 08/25/2020  1244     History Chief Complaint  Patient presents with   Chest Pain    Paul [name] is a 80 y.o. male.  Pt presents to the ED today with cp.  Pt said he's been having chest pain frequently.  He will take a nitro and it will go away, but then come back.  He was hospitalized from 7/7 to 7/9 for cp.  It was thought to be atypical and EKG and troponins were negative.  Cards did see pt and recommended continuing current meds.  Pt also has recently been diagnosed with pulmonary fibrosis.  He has an appt soon with pulm to do PFTs.  He is not sob now, but does get sob with exertion.  Pt denies any f/c.  He does note that both his legs have been swollen since d/c.      Past Medical History:  Diagnosis Date   AAA (abdominal aortic aneurysm) ([name])    Abnormal LFTs    AKI (acute kidney injury) ([place])    Anemia of chronic disease    Arthritis    "all over" (12/04/2015)   Atherosclerosis of native coronary artery of native heart without angina pectoris    CAD (coronary artery disease)    with cath 09/26/08 showing severe heart disease in RCA with POBA, mild to mod diesease in circ and LAD   Chronic back pain    back problems prior to surgery    Constipation    Coronary atherosclerosis of native coronary artery    Dehydration    Diabetes mellitus    with neuropathy   Diarrhea    Elevated liver enzymes    Generalized weakness    Gout of right hand    Hiatal hernia    History of gastroesophageal reflux (GERD)    History of stomach ulcers    Hyperlipidemia    Hypertension    Metabolic acidosis    Normochromic anemia 02/23/2016   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   Thrombocytopenia ([place])    Unintentional weight loss 09/27/2017   33 lbs between 4/19 & 8/19   Vitamin B12 deficiency    Vitamin B12 deficiency    Vitamin D deficiency    Weakness     Patient Active  Problem List   Diagnosis Date Noted   CKD (chronic kidney disease), stage III ([place]) 08/07/2020   Chronic low back pain without sciatica 12/10/2019   Rheumatoid arthritis ([place]) 12/10/2019   Pulmonary fibrosis ([place]) 12/10/2019   Aortic atherosclerosis ([place]) 12/10/2019   CAP (community acquired pneumonia) 12/01/2019   Hyponatremia 12/01/2019   Onychomycosis 03/13/2019   History of amputation of great toe ([place]) 03/08/2019   Current smoker 01/17/2019   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   Unintentional weight loss 09/27/2017   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   Hyperlipidemia associated with type 2 diabetes mellitus ([place]) 01/21/2017   Type 2 diabetes mellitus with diabetic neuropathy, without long-term current use of insulin ([place]) 01/21/2017   Diabetic nephropathy associated with type 2 diabetes mellitus ([place]) 01/21/2017   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   Shortness of breath    Thrombocytopenia due to drugs    Allergy status to unspecified drugs, medicaments and biological substances status    AAA (abdominal aortic aneurysm) ([name])-infrarenal 3.4 cm 12/04/2015   Vitamin B12 deficiency 04/22/2015   Anemia of chronic disease 04/18/2015   Hypertension complicating diabetes ([place]) 07/22/2010  CAD (coronary artery disease) 09/17/2008    Past Surgical History:  Procedure Laterality Date   ANKLE FRACTURE SURGERY Left    "crushed"   BACK SURGERY  X 3   "fell 4 stories in 1977"   [place]  09/26/2008   [name] 06/03/2010   FRACTURE SURGERY     LEFT HEART CATHETERIZATION WITH CORONARY ANGIOGRAM N/A 01/21/2012   Procedure: LEFT HEART CATHETERIZATION WITH CORONARY ANGIOGRAM;  Surgeon: Burnell Blanks, MD;  Location: [facility] C[facility] LAB;  Service: Cardiovascular;  Laterality: N/A;   [place]; ~ [place]  ~ 1998   "3rd OR to reconstruct my back after fall in  1977"   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 and 2nd       Family History  Problem Relation Age of Onset   Heart attack Mother    Cancer Mother    Heart disease Mother    Heart attack Sister    Heart disease Sister     Social History   Tobacco Use   Smoking status: Every Day    Packs/day: 0.50    Years: 20.00    Pack years: 10.00    Types: Cigars, Cigarettes    Start date: 1961   Smokeless tobacco: Never   Tobacco comments:    Started smoking at age 57, quit at age 89, then restarted at age 54  Vaping Use   Vaping Use: Never used  Substance Use Topics   Alcohol use: Not Currently   Drug use: No    Home Medications Prior to Admission medications   Medication Sig Start Date End Date Taking? Authorizing Provider  amLODipine (NORVASC) 5 MG tablet TAKE 1 TABLET BY MOUTH  DAILY 01/24/20  Yes Burnell Blanks, MD  aspirin 81 MG chewable tablet Chew 81 mg by mouth daily.   Yes [provider]  Budeson-Glycopyrrol-Formoterol (BREZTRI AEROSPHERE) 160-9-4.8 MCG/ACT AERO Inhale 2 puffs into the lungs in the morning and at bedtime. 08/06/20  Yes Freddi Starr, MD  clopidogrel (PLAVIX) 75 MG tablet TAKE 1 TABLET BY MOUTH  DAILY 01/18/20  Yes Denita Lung, MD  ENBREL SURECLICK 50 MG/ML injection Inject 50 mg into the skin once a week.  04/26/19  Yes [provider]  fish oil-omega-3 fatty acids 1000 MG capsule Take 1 g by mouth daily.   Yes [provider]  isosorbide mononitrate (IMDUR) 60 MG 24 hr tablet Take 1 tablet (60 mg total) by mouth at bedtime. 06/27/20  Yes Burnell Blanks, MD  leflunomide (ARAVA) 20 MG tablet Take 20 mg by mouth daily. 05/21/19  Yes [provider]  metoprolol tartrate (LOPRESSOR) 25 MG tablet TAKE 1 TABLET BY MOUTH  TWICE DAILY 07/07/20  Yes Burnell Blanks, MD  Multiple Vitamins-Minerals (CENTRUM) tablet Take 1 tablet by mouth daily.   Yes [provider]  nitroGLYCERIN (NITROSTAT) 0.4 MG SL  tablet Place 0.4 mg under the tongue every 5 (five) minutes as needed for chest pain. For 3 doses 10/12/11  Yes Denita Lung, MD  pyridoxine (B-6) 100 MG tablet Take 100 mg by mouth daily.   Yes [provider]  simvastatin (ZOCOR) 20 MG tablet TAKE 1 TABLET BY MOUTH AT  BEDTIME 01/24/20  Yes Burnell Blanks, MD  febuxostat (ULORIC) 40 MG tablet Take 40 mg by mouth daily.    [provider]  Allergies    Allopurinol and Metformin and related  Review of Systems   Review of Systems  Cardiovascular:  Positive for chest pain.  All other systems reviewed and are negative.  Physical Exam Updated Vital Signs BP (!) 163/43   Pulse 62   Temp 97.7 F (36.5 C) (Oral)   Resp (!) 30   Ht 5\' 10"  (1.778 m)   Wt 72.6 kg   SpO2 97%   BMI 22.96 kg/m   Physical Exam Vitals and nursing note reviewed.  Constitutional:      Appearance: He is well-developed.  HENT:     Head: Normocephalic and atraumatic.  Eyes:     Extraocular Movements: Extraocular movements intact.     Pupils: Pupils are equal, round, and reactive to light.  Cardiovascular:     Rate and Rhythm: Normal rate and regular rhythm.     Heart sounds: Normal heart sounds.  Pulmonary:     Effort: Pulmonary effort is normal.     Breath sounds: Normal breath sounds.  Abdominal:     General: Bowel sounds are normal.     Palpations: Abdomen is soft.  Musculoskeletal:        General: Normal range of motion.     Cervical back: Normal range of motion and neck supple.     Right lower leg: Edema present.     Left lower leg: Edema present.  Skin:    General: Skin is warm.     Capillary Refill: Capillary refill takes less than 2 seconds.  Neurological:     General: No focal deficit present.     Mental Status: He is alert and oriented to person, place, and time.  Psychiatric:        Mood and Affect: Mood normal.        Behavior: Behavior normal.    ED Results / Procedures / Treatments   Labs (all  labs ordered are listed, but only abnormal results are displayed) Labs Reviewed  BASIC METABOLIC PANEL - Abnormal; Notable for the following components:      Result Value   Sodium 130 (*)    CO2 21 (*)    Glucose, Bld 131 (*)    BUN 39 (*)    Creatinine, Ser 1.47 (*)    Calcium 8.5 (*)    GFR, Estimated 49 (*)    All other components within normal limits  CBC - Abnormal; Notable for the following components:   WBC 131.5 (*)    RBC 2.86 (*)    Hemoglobin 8.0 (*)    HCT 24.0 (*)    RDW 21.5 (*)    Platelets 106 (*)    All other components within normal limits  D-DIMER, QUANTITATIVE - Abnormal; Notable for the following components:   D-Dimer, Quant 1.47 (*)    All other components within normal limits  TROPONIN I (HIGH SENSITIVITY)  TROPONIN I (HIGH SENSITIVITY)    EKG EKG Interpretation  Date/Time:  Thursday August 14 2020 12:48:00 EDT Ventricular Rate:  68 PR Interval:  178 QRS Duration: 105 QT Interval:  416 QTC Calculation: 443 R Axis:   15 Text Interpretation: Sinus rhythm LVH with secondary repolarization abnormality No significant change since last tracing Confirmed by Isla Pence [phone]) on 08/05/2020 1:20:44 PM  Radiology DG Chest Port 1 View  Result Date: 08/28/2020 CLINICAL DATA:  Chest pain and shortness of breath. Pulmonary fibrosis. EXAM: PORTABLE CHEST 1 VIEW COMPARISON:  08/07/2020 FINDINGS: Stable cardiomediastinal contours. Persistent bilateral pleural effusions identified.  Chronic reticular interstitial opacities with a lower lung zone predominance is again seen compatible with diagnosis of pulmonary fibrosis. No superimposed airspace consolidation. IMPRESSION: No change in bilateral pleural effusions and pulmonary fibrosis. Electronically Signed   By: Kerby Moors M.D.   On: 08/17/2020 13:30   VAS [place] LOWER EXTREMITY VENOUS (DVT) (ONLY [facility] & [facility])  Result Date: 08/28/2020  Lower Venous DVT Study Patient Name:  JOSPH NORFLEET  Date of Exam:   08/26/2020  Medical Rec #: [id number]           Accession #:    [id number] Date of Birth: 06-04-1940            Patient Gender: M Patient Age:   80Y Exam Location:  [facility] Procedure:      VAS [place] LOWER EXTREMITY VENOUS (DVT) Referring Phys: [id number] Baldomero Mirarchi --------------------------------------------------------------------------------  Indications: Swelling, and SOB.  Comparison Study: No prior study Performing Technologist: Maudry Mayhew MHA, RDMS, RVT, RDCS  Examination Guidelines: A complete evaluation includes B-mode imaging, spectral Doppler, color Doppler, and power Doppler as needed of all accessible portions of each vessel. Bilateral testing is considered an integral part of a complete examination. Limited examinations for reoccurring indications may be performed as noted. The reflux portion of the exam is performed with the patient in reverse Trendelenburg.  +---------+---------------+---------+-----------+----------+--------------+ RIGHT    CompressibilityPhasicitySpontaneityPropertiesThrombus Aging +---------+---------------+---------+-----------+----------+--------------+ CFV      Full           Yes      Yes                                 +---------+---------------+---------+-----------+----------+--------------+ SFJ      Full                                                        +---------+---------------+---------+-----------+----------+--------------+ FV Prox  Full                                                        +---------+---------------+---------+-----------+----------+--------------+ FV Mid   Full                                                        +---------+---------------+---------+-----------+----------+--------------+ FV DistalFull                                                        +---------+---------------+---------+-----------+----------+--------------+ PFV      Full                                                         +---------+---------------+---------+-----------+----------+--------------+  POP      Full           Yes      Yes                                 +---------+---------------+---------+-----------+----------+--------------+ PTV      Full                                                        +---------+---------------+---------+-----------+----------+--------------+ PERO     Full                                                        +---------+---------------+---------+-----------+----------+--------------+   +---------+---------------+---------+-----------+----------+--------------+ LEFT     CompressibilityPhasicitySpontaneityPropertiesThrombus Aging +---------+---------------+---------+-----------+----------+--------------+ CFV      Full           Yes      Yes                                 +---------+---------------+---------+-----------+----------+--------------+ SFJ      Full                                                        +---------+---------------+---------+-----------+----------+--------------+ FV Prox  Full                                                        +---------+---------------+---------+-----------+----------+--------------+ FV Mid   Full                                                        +---------+---------------+---------+-----------+----------+--------------+ FV DistalFull                                                        +---------+---------------+---------+-----------+----------+--------------+ PFV      Full                                                        +---------+---------------+---------+-----------+----------+--------------+ POP      Full           Yes      Yes                                 +---------+---------------+---------+-----------+----------+--------------+  PTV      Full                                                         +---------+---------------+---------+-----------+----------+--------------+ PERO     Full                                                        +---------+---------------+---------+-----------+----------+--------------+    Summary: BILATERAL: - No evidence of deep vein thrombosis seen in the lower extremities, bilaterally. -No evidence of popliteal cyst, bilaterally.   *See table(s) above for measurements and observations.    Preliminary     Procedures Procedures   Medications Ordered in ED Medications  morphine 4 MG/ML injection 4 mg (4 mg Intravenous Given 08/22/2020 1431)  ondansetron (ZOFRAN) injection 4 mg (4 mg Intravenous Given 08/16/2020 1431)    ED Course  I have reviewed the triage vital signs and the nursing notes.  Pertinent labs & imaging results that were available during my care of the patient were reviewed by me and considered in my medical decision making (see chart for details).    MDM Rules/Calculators/A&P                          Pt's DDimer is +, but it's been positive in the past.  Bilateral leg [place] neg for DVT.  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  Sodium is  chronically low and he has chronic CKD.  Pt said his pain goes away with nitro and he's been having to take it a lot.  Due to this, I have asked cards to see pt.  If they feel cp is not cardiac, he can go home.    Final Clinical Impression(s) / ED Diagnoses Final diagnoses:  Chest pain, unspecified type  Peripheral edema    Rx / DC Orders ED Discharge Orders     None        Isla Pence, MD 08/13/2020 1513

## 2020-08-14 NOTE — Consult Note (Signed)
Cardiology Consultation:   Patient ID: Paul [name] MRN: [id number]; DOB: May 31, 1940  Admit date: 08/05/2020 Date of Consult: 08/27/2020  PCP:  Denita Lung, MD   [facility] Providers Cardiologist:  Lauree Chandler, MD   {  Patient Profile:   Paul [name] is a 80 y.o. male with a history of CAD with known CTO of RCA with moderate disease of LAD and LCX on cardiac catheterization in 2010 and recent low risk Myoview in 07/2020, bilateral carotid artery disease, AAA, pulmonary fibrosis with continued tobacco use, hypertension, hyperlipidemia, diabetes mellitus, prior DVT, myelodysplastic disease, anemia of chronic disease, GERD, and chronic back pain who is being seen for the evaluation of chest pain at the request of Dr. Roslynn Amble.  History of Present Illness:   Paul [name] is a 80 year old male with the above history who is followed by Dr. Angelena Form. Patient has known CAD. Cardiac catheterization in 2010 showed CTO of RCA not amenable to PCI with moderate disease of the LAD and LCX. He has had recurrent angina since then. Repeat cardiac catheterization not performed due to anemia secondary to myelodysplastic disease. However, chest pain improved with addition of Imdur. Patient was seen in 06/2020 with worsening dyspnea. Recent Echo prior to this visit showed LVEF of 55% with mild AS/moderate AI, and mild MR. Myoview was ordered for further evaluation and was low risk with no evidence of ischemia. Patient was seen by Dr. Angelena Form for follow-up on 07/28/2020 at which time he was having ongoing dyspnea but otherwise stable from a cardiac standpoint. Dyspnea was felt to be multifactorial with severe anemia playing a large role. He also has known interstitial lung disease and continues to smoke. Dyspnea felt to be primary related to lung disease consistent with UIP. Chest CT in 07/2020 showed basilar predominant fibrotic interstitial lung disease. Patient was referred to Pulmonology and  was seen by Dr. Erin Fulling on 08/06/2020 and was started on Prednisone taper as well as Breztri inhaler. PFTs were also ordered.  Patient was recently admitted from 08/07/2020 to 08/09/2020 for chest pain. EKG showed sinus rhythm with ST depression in lead II and V5-V6. High-sensitivity troponin negative. He was felt to be mildly volume overloaded and was diuresed. Given recent low risk Myoview, no other ischemic work-up was felt to be needed. Chest pain was not felt to be cardiac in nature. Of note, patient has myelodysplastic disease but WBC was noted to be significantly higher than baseline at 71,000 (baseline 20,000 to 40,000).Peripheral smear showed no blasts. Elevated WBC was found to be related to recent steroid use for pulmonary fibrosis.  Patient presents back to the ED today with chest pain and shortness of breath. Patient states he had no significant improvement following discharge. He continues to have chest tightness that is much worse when he lays down at night. Also reports shortness of breath when laying down at night. He states he has not slept more than 2 hours in the past 4 weeks due to chest pain and shortness of breath when laying down. He denies any pleuritic pain. Does not seem to be worse with exertion. Does improve after taking Nitro for about 10-20 minutes but then returns. He states he actually feels like his chronic dyspnea and cough has improved. No palpitations, lightheadedness, dizziness, or syncope. No recent fevers or illnesses. No GI symptoms. No abnormal bleeding in urine or stools. He does reports is leg have been swollen for the last week.  Upon arrival to the ED, patient  hypertensive but vitals stable. EKG showed normal sinus rhythm, rate 68 bpm, with no acute ischemic changes from prior tracing. Initial high-sensitivity troponin negative. Chest x-ray persistent bilateral pleural effusion and chronic reticular interstitial opacities consistent with pulmonary fibrosis (unchanged from  prior). D-dimer 1.47.   Past Medical History:  Diagnosis Date   AAA (abdominal aortic aneurysm) ([name])    Abnormal LFTs    AKI (acute kidney injury) ([place])    Anemia of chronic disease    Arthritis    "all over" (12/04/2015)   Atherosclerosis of native coronary artery of native heart without angina pectoris    CAD (coronary artery disease)    with cath 09/26/08 showing severe heart disease in RCA with POBA, mild to mod diesease in circ and LAD   Chronic back pain    back problems prior to surgery    Constipation    Coronary atherosclerosis of native coronary artery    Dehydration    Diabetes mellitus    with neuropathy   Diarrhea    Elevated liver enzymes    Generalized weakness    Gout of right hand    Hiatal hernia    History of gastroesophageal reflux (GERD)    History of stomach ulcers    Hyperlipidemia    Hypertension    Metabolic acidosis    Normochromic anemia 02/23/2016   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)    Unintentional weight loss 09/27/2017   33 lbs between 4/19 & 8/19   Vitamin B12 deficiency    Vitamin B12 deficiency    Vitamin D deficiency    Weakness     Past Surgical History:  Procedure Laterality Date   ANKLE FRACTURE SURGERY Left    "crushed"   BACK SURGERY  X 3   "fell 4 stories in 1977"   [place] WITH STENT PLACEMENT  09/26/2008   [name] 06/03/2010   FRACTURE SURGERY     LEFT HEART CATHETERIZATION WITH CORONARY ANGIOGRAM N/A 01/21/2012   Procedure: LEFT HEART CATHETERIZATION WITH CORONARY ANGIOGRAM;  Surgeon: Burnell Blanks, MD;  Location: [facility] CATH LAB;  Service: Cardiovascular;  Laterality: N/A;   [place]; ~ [place]  ~ 1998   "3rd OR to reconstruct my back after fall in 1977"   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 and 2nd     Home Medications:  Prior to Admission medications   Medication Sig Start Date End Date Taking? Authorizing  Provider  amLODipine (NORVASC) 5 MG tablet TAKE 1 TABLET BY MOUTH  DAILY 01/24/20  Yes Burnell Blanks, MD  aspirin 81 MG chewable tablet Chew 81 mg by mouth daily.   Yes [provider]  Budeson-Glycopyrrol-Formoterol (BREZTRI AEROSPHERE) 160-9-4.8 MCG/ACT AERO Inhale 2 puffs into the lungs in the morning and at bedtime. 08/06/20  Yes Freddi Starr, MD  clopidogrel (PLAVIX) 75 MG tablet TAKE 1 TABLET BY MOUTH  DAILY 01/18/20  Yes Denita Lung, MD  ENBREL SURECLICK 50 MG/ML injection Inject 50 mg into the skin once a week.  04/26/19  Yes [provider]  fish oil-omega-3 fatty acids 1000 MG capsule Take 1 g by mouth daily.   Yes [provider]  isosorbide mononitrate (IMDUR) 60 MG 24 hr tablet Take 1 tablet (60 mg total) by mouth at bedtime. 06/27/20  Yes Burnell Blanks, MD  leflunomide (ARAVA) 20 MG tablet Take 20 mg by mouth  daily. 05/21/19  Yes [provider]  metoprolol tartrate (LOPRESSOR) 25 MG tablet TAKE 1 TABLET BY MOUTH  TWICE DAILY 07/07/20  Yes Burnell Blanks, MD  Multiple Vitamins-Minerals (CENTRUM) tablet Take 1 tablet by mouth daily.   Yes [provider]  nitroGLYCERIN (NITROSTAT) 0.4 MG SL tablet Place 0.4 mg under the tongue every 5 (five) minutes as needed for chest pain. For 3 doses 10/12/11  Yes Denita Lung, MD  pyridoxine (B-6) 100 MG tablet Take 100 mg by mouth daily.   Yes [provider]  simvastatin (ZOCOR) 20 MG tablet TAKE 1 TABLET BY MOUTH AT  BEDTIME 01/24/20  Yes Burnell Blanks, MD  febuxostat (ULORIC) 40 MG tablet Take 40 mg by mouth daily.    [provider]    Inpatient Medications: Scheduled Meds:  Continuous Infusions:  PRN Meds:   Allergies:    Allergies  Allergen Reactions   Allopurinol Other (See Comments)    thrombocytopenia   Metformin And Related Diarrhea    Social History:   Social History   Socioeconomic History   Marital status:  Married    Spouse name: Scientist, physiological   Number of children: 0   Years of education: Not on file   Highest education level: Not on file  Occupational History   Occupation: retired  Tobacco Use   Smoking status: Every Day    Packs/day: 0.50    Years: 20.00    Pack years: 10.00    Types: Cigars, Cigarettes    Start date: 1961   Smokeless tobacco: Never   Tobacco comments:    Started smoking at age 43, quit at age 84, then restarted at age 56  Vaping Use   Vaping Use: Never used  Substance and Sexual Activity   Alcohol use: Not Currently   Drug use: No   Sexual activity: Not Currently    Partners: Female  Other Topics Concern   Not on file  Social History Narrative   Divorced, 1 child.    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    Owned roofing co. In [place]   Social Determinants of Health   Financial Resource Strain: Not on file  Food Insecurity: Not on file  Transportation Needs: Not on file  Physical Activity: Not on file  Stress: Not on file  Social Connections: Not on file  Intimate Partner Violence: Not on file    Family History:   Family History  Problem Relation Age of Onset   Heart attack Mother    Cancer Mother    Heart disease Mother    Heart attack Sister    Heart disease Sister      ROS:  Please see the history of present illness.  Review of Systems  Constitutional:  Negative for fever.  HENT:  Negative for congestion.   Respiratory:  Positive for cough, sputum production and shortness of breath.   Cardiovascular:  Positive for chest pain, orthopnea and leg swelling. Negative for palpitations.  Gastrointestinal:  Negative for blood in stool, melena, nausea and vomiting.  Genitourinary:  Negative for hematuria.  Musculoskeletal:  Negative for myalgias.  Neurological:  Negative for dizziness and loss of consciousness.  Endo/Heme/Allergies:  Does not bruise/bleed easily.  Psychiatric/Behavioral:  Positive for substance abuse.      Physical Exam/Data:   Vitals:   08/19/2020 1430 08/25/2020 1445 08/07/2020 1500 08/12/2020 1630  BP: (!) 148/54 (!) 150/50 (!) 163/43 (!) 154/51  Pulse: 64 64 62  63  Resp: (!) 24 (!) 25 (!) 30 (!) 21  Temp:      TempSrc:      SpO2: 100% 100% 97% 97%  Weight:      Height:       No intake or output data in the 24 hours ending 08/15/2020 1727 Last 3 Weights 08/25/2020 08/09/2020 08/07/2020  Weight (lbs) 160 lb 162 lb 3.2 oz 157 lb 14.4 oz  Weight (kg) 72.576 kg 73.573 kg 71.623 kg     Body mass index is 22.96 kg/m.  General: 80 y.o. thin Caucasian male resting comfortably in no acute distress. HEENT: Normocephalic and atraumatic. Sclera clear.  Neck: Supple. No JVD. Heart: Borderline bradycardic with slightly irregular rhythm. Distinct S1 and S2. II/VI systolic murmur. Radial and distal pedal pulses 2+ and equal bilaterally. Lungs: No increased work of breathing. Course crackles bilaterally with rhonchi on the right. Abdomen: Soft, non-distended, and non-tender to palpation.  MSK: Normal strength and tone for age. Extremities: 1+ pitting edema of bilateral lower extremities. Skin: Warm and dry. Neuro: Alert and oriented x3. No focal deficits. Psych: Normal affect. Responds appropriately.   EKG:  The EKG was personally reviewed and demonstrates:  Normal sinus rhythm, rate 68 bpm, with LVH with some repolarization changes. No acute changes compared to prior tracing.  Telemetry:  Telemetry was personally reviewed and demonstrates:  Sinus rhythm with rates in the 50 to 60s. PACs. A few missed beats but no pauses > 3 seconds.  Relevant CV Studies:  Echocardiogram 06/23/2020: Impressions: 1. Left ventricular ejection fraction, by estimation, is 55%. The left  ventricle has normal function. The left ventricle has no regional wall  motion abnormalities. The left ventricular internal cavity size was mildly  dilated. There is mild left  ventricular hypertrophy. Left ventricular diastolic  parameters were  normal.   2. Right ventricular systolic function is normal. The right ventricular  size is normal.   3. Left atrial size was moderately dilated.   4. The mitral valve is normal in structure. Mild mitral valve  regurgitation. No evidence of mitral stenosis. Moderate mitral annular  calcification.   5. Degree of AR similar to that seen on TTE 12/02/19 . The aortic valve  is normal in structure. There is moderate calcification of the aortic  valve. There is moderate thickening of the aortic valve. Aortic valve  regurgitation is moderate. Mild aortic  valve stenosis.   6. The inferior vena cava is dilated in size with >50% respiratory  variability, suggesting right atrial pressure of 8 mmHg. _______________   Myoview 07/14/2020: The left ventricular ejection fraction is normal (55-65%). Nuclear stress EF: 61%. There was no ST segment deviation noted during stress. There is a small defect of moderate severity present in the basal inferior, mid inferior and apical inferior location. The defect is non-reversible. In the setting of normal LVF, this is most consistent with diaphragmatic attenuation artifact. No ischemia noted. This is a low risk study.  Laboratory Data:  High Sensitivity Troponin:   Recent Labs  Lab 08/07/20 1301 08/07/20 1458 08/07/20 2252 08/25/2020 1257  TROPONINIHS 9 11 10 10      Chemistry Recent Labs  Lab 08/08/20 0234 08/09/20 0046 08/03/2020 1257  NA 130* 126* 130*  K 5.0 5.5* 4.7  CL 103 101 102  CO2 20* 22 21*  GLUCOSE 219* 124* 131*  BUN 36* 46* 39*  CREATININE 1.55* 1.69* 1.47*  CALCIUM 9.0 8.4* 8.5*  GFRNONAA 46* 41* 49*  ANIONGAP 7 3*  7    Recent Labs  Lab 08/08/20 0234  PROT 7.8  ALBUMIN 2.8*  AST 27  ALT 19  ALKPHOS 83  BILITOT 0.8   Hematology Recent Labs  Lab 08/08/20 0234 08/09/20 0046 08/22/2020 1257  WBC 104.4* 128.6* 131.5*  RBC 3.24* 2.72* 2.86*  HGB 9.2* 7.7* 8.0*  HCT 26.7* 22.4* 24.0*  MCV 82.4 82.4  83.9  MCH 28.4 28.3 28.0  MCHC 34.5 34.4 33.3  RDW 20.9* 21.1* 21.5*  PLT 234 160 106*   BNPNo results for input(s): BNP, PROBNP in the last 168 hours.  DDimer  Recent Labs  Lab 08/18/2020 1257  DDIMER 1.47*     Radiology/Studies:  DG Chest Port 1 View  Result Date: 08/13/2020 CLINICAL DATA:  Chest pain and shortness of breath. Pulmonary fibrosis. EXAM: PORTABLE CHEST 1 VIEW COMPARISON:  08/07/2020 FINDINGS: Stable cardiomediastinal contours. Persistent bilateral pleural effusions identified. Chronic reticular interstitial opacities with a lower lung zone predominance is again seen compatible with diagnosis of pulmonary fibrosis. No superimposed airspace consolidation. IMPRESSION: No change in bilateral pleural effusions and pulmonary fibrosis. Electronically Signed   By: Kerby Moors M.D.   On: 08/10/2020 13:30   VAS [place] LOWER EXTREMITY VENOUS (DVT) (ONLY [facility] & [facility])  Result Date: 08/16/2020  Lower Venous DVT Study Patient Name:  Paul [name]  Date of Exam:   08/18/2020 Medical Rec #: [id number]           Accession #:    [id number] Date of Birth: 01-19-1941            Patient Gender: M Patient Age:   80Y Exam Location:  [facility] Procedure:      VAS [place] LOWER EXTREMITY VENOUS (DVT) Referring Phys: [id number] JULIE HAVILAND --------------------------------------------------------------------------------  Indications: Swelling, and SOB.  Comparison Study: No prior study Performing Technologist: Maudry Mayhew MHA, RDMS, RVT, RDCS  Examination Guidelines: A complete evaluation includes B-mode imaging, spectral Doppler, color Doppler, and power Doppler as needed of all accessible portions of each vessel. Bilateral testing is considered an integral part of a complete examination. Limited examinations for reoccurring indications may be performed as noted. The reflux portion of the exam is performed with the patient in reverse Trendelenburg.   +---------+---------------+---------+-----------+----------+--------------+ RIGHT    CompressibilityPhasicitySpontaneityPropertiesThrombus Aging +---------+---------------+---------+-----------+----------+--------------+ CFV      Full           Yes      Yes                                 +---------+---------------+---------+-----------+----------+--------------+ SFJ      Full                                                        +---------+---------------+---------+-----------+----------+--------------+ FV Prox  Full                                                        +---------+---------------+---------+-----------+----------+--------------+ FV Mid   Full                                                        +---------+---------------+---------+-----------+----------+--------------+  FV DistalFull                                                        +---------+---------------+---------+-----------+----------+--------------+ PFV      Full                                                        +---------+---------------+---------+-----------+----------+--------------+ POP      Full           Yes      Yes                                 +---------+---------------+---------+-----------+----------+--------------+ PTV      Full                                                        +---------+---------------+---------+-----------+----------+--------------+ PERO     Full                                                        +---------+---------------+---------+-----------+----------+--------------+   +---------+---------------+---------+-----------+----------+--------------+ LEFT     CompressibilityPhasicitySpontaneityPropertiesThrombus Aging +---------+---------------+---------+-----------+----------+--------------+ CFV      Full           Yes      Yes                                  +---------+---------------+---------+-----------+----------+--------------+ SFJ      Full                                                        +---------+---------------+---------+-----------+----------+--------------+ FV Prox  Full                                                        +---------+---------------+---------+-----------+----------+--------------+ FV Mid   Full                                                        +---------+---------------+---------+-----------+----------+--------------+ FV DistalFull                                                        +---------+---------------+---------+-----------+----------+--------------+  PFV      Full                                                        +---------+---------------+---------+-----------+----------+--------------+ POP      Full           Yes      Yes                                 +---------+---------------+---------+-----------+----------+--------------+ PTV      Full                                                        +---------+---------------+---------+-----------+----------+--------------+ PERO     Full                                                        +---------+---------------+---------+-----------+----------+--------------+     Summary: BILATERAL: - No evidence of deep vein thrombosis seen in the lower extremities, bilaterally. -No evidence of popliteal cyst, bilaterally.   *See table(s) above for measurements and observations. Electronically signed by Jamelle Haring on 08/31/2020 at 5:13:17 PM.    Final      Assessment and Plan:   Chest Pain History of CAD - Last cardiac catheterization in 2010 showed CTO of RCA and moderate disease in LAD and LCX. Myoview in 07/2020 low risk with no evidence of ischemia. Patient recently admitted last week with atypical chest pain. Work-up unremarkable. Not felt to be cardiac in nature. Discharged on 08/09/2020. Returns today with  recurrent chest pain. - EKG showed no acute changes. - Initial high-sensitivity troponin negative. Repeat pending. - Recent Echo  06/2020 showed 55% and normal wall motion. - Continue DAPT with Aspirin and Plavix. - Continue Lopressor 25mg  twice daily. - Continue Amlodipine 5mg  daily. - We had increased Imdur to 90mg  daily last admission but looks like he was discharged on 60mg  daily. Can try increasing Imdur again but it does not sound like this helped much. - Continue Simvastatin 20mg  daily. - Symptoms very atypical. Mainly occurs when he is laying down at night. However, does improve with Nitro. Discussed with MD - do not feel like this is cardiac in nature at this time. Recommend chest CTA to rule out PE. Also question whether this is from leukostasis given chest pain started after being started on prednisone and WBC started to rise.   Chronic Diastolic CHF  - Patient has been having severe dyspnea at rest and with exertion for the past several months. Felt to primarily be due to interstitial lung disease. He states this has actually improved some since last admission. - Chest x-ray shows unchanged bilateral effusions and fibrosis from last admission. - Echo in 06/2020 showed normal LV function. - He has lower extremity edema which is may be due to chronic venous insufficiency but otherwise does not appear volume overloaded. - No diuresis needed at this time. - Can use compression  stockings to help with edema. - Monitor daily weights, strict I/Os, and renal function.   Interstitial Lung Disease - Management per primary team.   Hypertension - BP elevated. - Continue Amlodipine, Lopressor, and Imdur as above. - Could consider increasing Amlodipine to 10mg  daily (although this may make lower extremity worse). - Management per primary team.   Hyperlipidemia - LDL 35 in 11/2019. - Continue Simvastatin 20mg  daily.   Type 2 Diabetes Mellitus - Management per primary team.   CKD Stage  III - Creatinine 1.47 on admission. Baseline 1.2 to 1.7. - Continue to monitor closely.   Dilatation of Ascending Thoracic Aorta - 4.1cm ascending thoracic aorta noted on recent chest CT on 07/14/2020. - Annual imaging with CTA or MRA recommended.   AAA - Abdominal ultrasound in 06/2019 showed 4.1cm infrarenal AAA (mildly enlarged since 2017). - PCP following as outpatient.   Leukocytosis Chronic Anemia Myelodysplastic Disease - Patient has history of myelodysplastic disease. WBC 131.5 on admission, up from 71.8 last week, 42.3 two weeks ago, and 24 one month ago. Peripheral smear on admission last week showed no blast. Elevated WBC felt to be secondary to steroids which were recently started for pulmonary fibrosis. - Hemoglobin stable at 8.0. Baseline in 8 to 9 range. - Concerned about leukostasis as cause as symptoms. Discussed with Dr. Debara Pickett, will start IV fluids at 75cc/hr. Otherwise, management per primary team and hematology.  Tobacco Abuse - Patient has been counseled on the importance of complete cessation in the past and has not been interested in quitting.     Risk Assessment/Risk Scores:   HEAR Score: 6{  New York Heart Association (NYHA) Functional Class NYHA Class IV. However, this is largely due to pulmonary fibrosis.   For questions or updates, please contact [facility] Please consult [email or web address] for contact info under    Signed, Darreld Mclean, PA-C  08/18/2020 5:27 PM

## 2020-08-14 NOTE — ED Provider Notes (Signed)
Signout note  80 year old gentleman with complex past medical history including history of  MDS/MPN, CAD, DM2, bilateral carotid artery disease, essential hypertension, hyperlipidemia, anemia of chronic disease, history of DVT, pulmonary fibrosis, RA, tobacco use disorder, AAA, CKD who presents to [facility] ED with chest pain.  EKG okay.  Troponin within normal limits.  Significant leukocytosis to 131.  Recent admission for similar leukocytosis, felt to be from steroid use and underlying MDS.  3:30 PM Received signout from Dr. Gilford Raid  Dr. Debara Pickett evaluated patient, recommends CTA chest, consultation with oncology, admission to medicine. 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  8:50 PM CTA chest negative for new acute process.  No PE.  Completed further chart review, last seen by Dr. Annamaria Boots with oncology on 7/8, "I reviewed his peripheral smear myself today, and agree with pathology review.  His leukocytosis is mainly neutrophil with left shift and maturation, no significant blasts.  Acute increase of white blood cell count is related to steroid, no evidence of acute leukemia transformation.".  Discussed case with oncology - Dr. Irene Limbo. He doubts leukostasis, given the smear, elevation in WBCs is not from acute leukemia transformation.  Will page [facility] hospitalist for admission for further management.   Lucrezia Starch, MD 08/30/2020 2050

## 2020-08-15 DIAGNOSIS — D469 Myelodysplastic syndrome, unspecified: Secondary | ICD-10-CM | POA: Diagnosis not present

## 2020-08-15 DIAGNOSIS — R0789 Other chest pain: Secondary | ICD-10-CM

## 2020-08-15 LAB — CBC WITH DIFFERENTIAL/PLATELET
Abs Immature Granulocytes: 30.62 10*3/uL — ABNORMAL HIGH (ref 0.00–0.07)
Basophils Absolute: 0.4 10*3/uL — ABNORMAL HIGH (ref 0.0–0.1)
Basophils Relative: 0 %
Eosinophils Absolute: 1.4 10*3/uL — ABNORMAL HIGH (ref 0.0–0.5)
Eosinophils Relative: 1 %
HCT: 22.9 % — ABNORMAL LOW (ref 39.0–52.0)
Hemoglobin: 7.8 g/dL — ABNORMAL LOW (ref 13.0–17.0)
Immature Granulocytes: 24 %
Lymphocytes Relative: 8 %
Lymphs Abs: 9.6 10*3/uL — ABNORMAL HIGH (ref 0.7–4.0)
MCH: 28.3 pg (ref 26.0–34.0)
MCHC: 34.1 g/dL (ref 30.0–36.0)
MCV: 83 fL (ref 80.0–100.0)
Monocytes Absolute: 12.5 10*3/uL — ABNORMAL HIGH (ref 0.1–1.0)
Monocytes Relative: 10 %
Neutro Abs: 73.4 10*3/uL — ABNORMAL HIGH (ref 1.7–7.7)
Neutrophils Relative %: 57 %
Platelets: 102 10*3/uL — ABNORMAL LOW (ref 150–400)
RBC: 2.76 MIL/uL — ABNORMAL LOW (ref 4.22–5.81)
RDW: 21.9 % — ABNORMAL HIGH (ref 11.5–15.5)
WBC: 127.9 10*3/uL (ref 4.0–10.5)
nRBC: 0.2 % (ref 0.0–0.2)

## 2020-08-15 LAB — BASIC METABOLIC PANEL
Anion gap: 5 (ref 5–15)
BUN: 38 mg/dL — ABNORMAL HIGH (ref 8–23)
CO2: 22 mmol/L (ref 22–32)
Calcium: 8.4 mg/dL — ABNORMAL LOW (ref 8.9–10.3)
Chloride: 102 mmol/L (ref 98–111)
Creatinine, Ser: 1.39 mg/dL — ABNORMAL HIGH (ref 0.61–1.24)
GFR, Estimated: 52 mL/min — ABNORMAL LOW (ref >60)
Glucose, Bld: 115 mg/dL — ABNORMAL HIGH (ref 70–99)
Potassium: 4.8 mmol/L (ref 3.5–5.1)
Sodium: 129 mmol/L — ABNORMAL LOW (ref 135–145)

## 2020-08-15 LAB — PATHOLOGIST SMEAR REVIEW

## 2020-08-15 LAB — SARS CORONAVIRUS 2 (TAT 6-24 HRS): SARS Coronavirus 2: NEGATIVE

## 2020-08-15 MED ORDER — EPOETIN ALFA-EPBX 40000 UNIT/ML IJ SOLN: 30000.0000 [IU] | Freq: Once | INTRAMUSCULAR | Status: DC

## 2020-08-15 MED ORDER — IPRATROPIUM-ALBUTEROL 0.5-2.5 (3) MG/3ML IN SOLN: 3.0000 mL | Freq: Four times a day (QID) | RESPIRATORY_TRACT | Status: DC | PRN

## 2020-08-15 MED ORDER — DARBEPOETIN ALFA 60 MCG/0.3ML IJ SOSY
60.0000 ug | PREFILLED_SYRINGE | Freq: Once | INTRAMUSCULAR | Status: AC
Start: 2020-08-15 — End: 2020-08-15
  Administered 2020-08-15: 60 ug via SUBCUTANEOUS
  Filled 2020-08-15: qty 0.3

## 2020-08-15 MED ORDER — NITROGLYCERIN 0.4 MG SL SUBL
0.4000 mg | SUBLINGUAL_TABLET | SUBLINGUAL | Status: DC | PRN
  Administered 2020-08-17 – 2020-08-21 (×5): 0.4 mg via SUBLINGUAL
  Filled 2020-08-15 (×5): qty 1

## 2020-08-15 NOTE — Progress Notes (Addendum)
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  He has a longstanding history of anemia and has had leukocytosis and thrombocytosis in the past.  He receives treatment with Retacrit every 2 weeks.  He was due to be seen in our office yesterday and received Retacrit.  However, he developed recurrent chest pain and has been admitted.  We last saw the patient on 08/08/2020 when he was admitted to the hospital.  WBC was greater than 100,000 last admission.  Pathology review peripheral blood smear last admission did not show any evidence of acute leukemia transformation.  Leukocytosis was thought to be due to steroid use.  Has been seen by cardiology and chest pain thought not to be cardiac in origin.  The patient reports that he developed chest pain just prior to coming to our visit and called 911.  His chest pain was at the top of his chest.  He reports that he continues to have intermittent chest pain.  The hospital.  Morphine relieves his pain.  He confirmed to me that he has not been taking prednisone since being discharged from the hospital.  He has not had any fevers or chills.  Denies shortness of breath.  No bleeding reported.  He has not had any recent fevers or chills.  REVIEW OF SYSTEMS:   Constitutional: Denies fevers, chills  Eyes: Denies blurriness of vision Ears, nose, mouth, throat, and face: Denies mucositis or sore throat Respiratory: Denies cough, dyspnea or wheezes Cardiovascular: Reports intermittent chest pain which is relieved with morphine Gastrointestinal:  Denies nausea, heartburn or change in bowel habits Skin: Denies abnormal skin rashes Lymphatics: Denies new lymphadenopathy or easy bruising Neurological:Denies numbness, tingling or new weaknesses Behavioral/Psych: Mood is stable, no new changes  Extremities: No lower extremity edema All other systems were reviewed with the patient and are negative.  I have reviewed the  past medical history, past surgical history, social history and family history with the patient and they are unchanged from previous note.   PHYSICAL EXAMINATION: ECOG PERFORMANCE STATUS: 2 - Symptomatic, <50% confined to bed  Vitals:   08/15/20 0745 08/15/20 0935  BP: (!) 119/49 (!) 123/48  Pulse: (!) 59 65  Resp: 17   Temp:    SpO2: 96%    Filed Weights   08/29/2020 1251 08/15/20 0300  Weight: 72.6 kg 73.7 kg    Intake/Output from previous day: 07/14 0701 - 07/15 0700 In: 485.9 [I.V.:485.9] Out: -   GENERAL:alert, no distress and comfortable SKIN: skin color, texture, turgor are normal, no rashes or significant lesions EYES: normal, Conjunctiva are pink and non-injected, sclera clear OROPHARYNX:no exudate, no erythema and lips, buccal mucosa, and tongue normal  LUNGS: clear to auscultation and percussion with normal breathing effort HEART: regular rate & rhythm and no murmurs and no lower extremity edema ABDOMEN:abdomen soft, non-tender and normal bowel sounds NEURO: alert & oriented x 3 with fluent speech, no focal motor/sensory deficits  LABORATORY DATA:  I have reviewed the data as listed CMP Latest Ref Rng & Units 08/15/2020 08/09/2020 08/09/2020  Glucose 70 - 99 mg/dL 115(H) 131(H) 124(H)  BUN 8 - 23 mg/dL 38(H) 39(H) 46(H)  Creatinine 0.61 - 1.24 mg/dL 1.39(H) 1.47(H) 1.69(H)  Sodium 135 - 145 mmol/L 129(L) 130(L) 126(L)  Potassium 3.5 - 5.1 mmol/L 4.8 4.7 5.5(H)  Chloride 98 - 111 mmol/L 102 102 101  CO2 22 - 32 mmol/L 22 21(L) 22  Calcium 8.9 - 10.3 mg/dL 8.4(L) 8.5(L)  8.4(L)  Total Protein 6.5 - 8.1 g/dL - - -  Total Bilirubin 0.3 - 1.2 mg/dL - - -  Alkaline Phos 38 - 126 U/L - - -  AST 15 - 41 U/L - - -  ALT 0 - 44 U/L - - -    Lab Results  Component Value Date   WBC 127.9 (HH) 08/15/2020   HGB 7.8 (L) 08/15/2020   HCT 22.9 (L) 08/15/2020   MCV 83.0 08/15/2020   PLT 102 (L) 08/15/2020   NEUTROABS 73.4 (H) 08/15/2020    DG Chest 2 View  Result Date:  08/07/2020 CLINICAL DATA:  Shortness of breath. EXAM: CHEST - 2 VIEW COMPARISON:  CT 07/14/2020.  Chest x-ray 12/01/2019. FINDINGS: Stable mediastinal prominence consistent with mediastinal fat again noted. Stable cardiomegaly. No pulmonary venous congestion. Chronic interstitial changes are again noted. Superimposed active interstitial process including interstitial edema and or pneumonitis cannot be excluded. Small bilateral pleural effusions are noted. Stable right apical pleural thickening consistent scarring. No pneumothorax. Degenerative change thoracic spine. IMPRESSION: 1.  Stable cardiomegaly.  No pulmonary venous congestion. 2. Chronic interstitial changes are again noted. Superimposed active interstitial process including interstitial edema and or pneumonitis cannot be excluded. Small bilateral pleural effusions are noted. Electronically Signed   By: Marcello Moores  Register   On: 08/07/2020 13:19   CT Angio Chest PE W and/or Wo Contrast  Result Date: 08/07/2020 CLINICAL DATA:  Chest pain.  Shortness of breath. EXAM: CT ANGIOGRAPHY CHEST WITH CONTRAST TECHNIQUE: Multidetector CT imaging of the chest was performed using the standard protocol during bolus administration of intravenous contrast. Multiplanar CT image reconstructions and MIPs were obtained to evaluate the vascular anatomy. CONTRAST:  22m OMNIPAQUE IOHEXOL 350 MG/ML SOLN COMPARISON:  Plain film of earlier today.  CT 07/14/2020. FINDINGS: Cardiovascular: The quality of this exam for evaluation of pulmonary embolism is good. No evidence of pulmonary embolism. Pulmonary artery enlargement, outflow tract 3.1 cm Aorta not well opacified. Aortic atherosclerosis. Tortuous thoracic aorta. Mild cardiomegaly. Three vessel coronary artery calcification. Mediastinum/Nodes: Prominent mediastinal nodes are again identified, favored to be reactive. Example at up to 1.1 cm in the high right paratracheal station. Lungs/Pleura: Small left greater than right pleural  effusions are unchanged. Bilateral calcified pleural plaques. Right greater than left apical pleuroparenchymal scarring. 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 Basilar and peripheral predominant subpleural reticulation with scattered basilar honeycombing. Right lower lobe calcified granuloma. Right middle lobe 5 mm pulmonary nodule is similar on 107/6. Upper Abdomen: Old granulomatous disease in the liver and spleen. Normal imaged portions of the stomach, gallbladder, pancreas, adrenal glands, kidneys. Abdominal aortic atherosclerosis. Musculoskeletal: Lower thoracic spondylosis with mild wedging of the T12 vertebral body. Review of the MIP images confirms the above findings. IMPRESSION: 1.  No evidence of pulmonary embolism. 2. No significant change compared to the CT of 07/14/2020. 3. Interstitial lung disease, consistent with asbestosis and asbestos related pleural disease. 4. Small bilateral pleural effusions. 5. Coronary artery atherosclerosis. Aortic Atherosclerosis (ICD10-I70.0). 6. Pulmonary artery enlargement suggests pulmonary arterial hypertension. Electronically Signed   By: KAbigail MiyamotoM.D.   On: 08/13/2020 19:18   DG Chest Port 1 View  Result Date: 08/31/2020 CLINICAL DATA:  Chest pain and shortness of breath. Pulmonary fibrosis. EXAM: PORTABLE CHEST 1 VIEW COMPARISON:  08/07/2020 FINDINGS: Stable cardiomediastinal contours. Persistent bilateral pleural effusions identified. Chronic reticular interstitial opacities with a lower lung zone predominance is again seen compatible with diagnosis of pulmonary fibrosis. No superimposed airspace  consolidation. IMPRESSION: No change in bilateral pleural effusions and pulmonary fibrosis. Electronically Signed   By: Kerby Moors M.D.   On: 08/31/2020 13:30   VAS [place] LOWER EXTREMITY VENOUS (DVT) (ONLY [facility] & [facility])  Result Date: 08/25/2020  Lower Venous DVT Study Patient Name:  Paul [name]  Date of Exam:    08/06/2020 Medical Rec #: [id number]           Accession #:    [id number] Date of Birth: 12-24-1940            Patient Gender: M Patient Age:   80Y Exam Location:  [facility] Procedure:      VAS [place] LOWER EXTREMITY VENOUS (DVT) Referring Phys: [id number] JULIE HAVILAND --------------------------------------------------------------------------------  Indications: Swelling, and SOB.  Comparison Study: No prior study Performing Technologist: Maudry Mayhew MHA, RDMS, RVT, RDCS  Examination Guidelines: A complete evaluation includes B-mode imaging, spectral Doppler, color Doppler, and power Doppler as needed of all accessible portions of each vessel. Bilateral testing is considered an integral part of a complete examination. Limited examinations for reoccurring indications may be performed as noted. The reflux portion of the exam is performed with the patient in reverse Trendelenburg.  +---------+---------------+---------+-----------+----------+--------------+ RIGHT    CompressibilityPhasicitySpontaneityPropertiesThrombus Aging +---------+---------------+---------+-----------+----------+--------------+ CFV      Full           Yes      Yes                                 +---------+---------------+---------+-----------+----------+--------------+ SFJ      Full                                                        +---------+---------------+---------+-----------+----------+--------------+ FV Prox  Full                                                        +---------+---------------+---------+-----------+----------+--------------+ FV Mid   Full                                                        +---------+---------------+---------+-----------+----------+--------------+ FV DistalFull                                                        +---------+---------------+---------+-----------+----------+--------------+ PFV      Full                                                         +---------+---------------+---------+-----------+----------+--------------+ POP      Full           Yes  Yes                                 +---------+---------------+---------+-----------+----------+--------------+ PTV      Full                                                        +---------+---------------+---------+-----------+----------+--------------+ PERO     Full                                                        +---------+---------------+---------+-----------+----------+--------------+   +---------+---------------+---------+-----------+----------+--------------+ LEFT     CompressibilityPhasicitySpontaneityPropertiesThrombus Aging +---------+---------------+---------+-----------+----------+--------------+ CFV      Full           Yes      Yes                                 +---------+---------------+---------+-----------+----------+--------------+ SFJ      Full                                                        +---------+---------------+---------+-----------+----------+--------------+ FV Prox  Full                                                        +---------+---------------+---------+-----------+----------+--------------+ FV Mid   Full                                                        +---------+---------------+---------+-----------+----------+--------------+ FV DistalFull                                                        +---------+---------------+---------+-----------+----------+--------------+ PFV      Full                                                        +---------+---------------+---------+-----------+----------+--------------+ POP      Full           Yes      Yes                                 +---------+---------------+---------+-----------+----------+--------------+ PTV      Full                                                         +---------+---------------+---------+-----------+----------+--------------+  PERO     Full                                                        +---------+---------------+---------+-----------+----------+--------------+     Summary: BILATERAL: - No evidence of deep vein thrombosis seen in the lower extremities, bilaterally. -No evidence of popliteal cyst, bilaterally.   *See table(s) above for measurements and observations. Electronically signed by Jamelle Haring on 08/13/2020 at 5:13:17 PM.    Final     ASSESSMENT AND PLAN: 1.  MDS/MPN 2.  Leukocytosis 3.  Anemia 4.  Thrombocytopenia 5.  Chest pain 6.  Pulmonary fibrosis 7.  CAD 8.  Type 2 diabetes mellitus 9.  Hyponatremia, chronic 10.  CKD 11.  Tobacco dependence 12.  RA   -The patient is followed by our office for MDS/MPN.  Currently receives Retacrit.  He was due for another dose in our office on 7/14 but missed this appointment due to hospital admission.  His hemoglobin is 7.8 today and will administer Retacrit 30,000 units subcu x1 dose.  He will continue his medication as an outpatient. -The patient continues to have persistent significant leukocytosis.  This was evaluated last admission and pathology review of peripheral blood smear did not show any evidence of transformation to acute leukemia.  His elevated white count was thought to be related to recent prednisone use.  The patient has now been off steroids for about a week.  His WBC remains elevated.  We will ask pathology to review his peripheral blood smear again.  I have entered the order for pathology review of peripheral blood smear and call the lab to process this order. -Discussed with the patient if his blood cell count continues to be elevated next week, recommend proceeding with bone marrow biopsy. -The patient is noted to have thrombocytopenia which was not present during his last hospital admission.  Thrombocytopenia is mild and will monitor for now. -Management of  other chronic medical conditions per hospitalist.   LOS: 0 days   Mikey Bussing, DNP, AGPCNP-BC, AOCNP 08/15/20  Addendum I have seen the patient, examined him. I agree with the assessment and and plan and have edited the notes.   Paul [name] was admitted for recurrent chest pain. He has been off prednisone since a week ago.  He has persistent leukocytosis, with prominent neutrophilia, and increased immature granulocytes.  His blood smear was reviewed by pathologist in the me last week, which showed no increased blasts.  I will wait for 1 more week, to see if he is a leukocytosis improve, before we make a decision about bone marrow biopsy to rule out acute leukemia transformation, also there is no clinical suspicion based on the peripheral blood at this point.  His leukocytosis can certainly because of his underlying MPN, treatment is usually Hydrea, however this will be difficult for him due to his underlying anemia.  He is due for epo, will give Aranesp injection today. I doubt his chest pain is related to his leukocytosis, but his cardiac work-up has been unrevealing so far.  I will follow-up next Monday if he still here, otherwise will see him back in clinic next week.   Truitt Merle  08/15/2020

## 2020-08-15 NOTE — Progress Notes (Signed)
PROGRESS NOTE    Paul [name]  LZJ:[id number] DOB: 07-May-1940 DOA: 08/15/2020 PCP: Denita Lung, MD     Brief Narrative:  Paul [name] is a 80 y.o. male with medical history significant for CAD, pulmonary fibrosis, rheumatoid arthritis, CKD 3a, hypertension, infrarenal AAA, dilation of ascending thoracic aorta, MDS/MPN, now presenting to emergency department for evaluation of chest pain.  Patient was admitted to the hospital 1 week ago with chest pain, evaluated by cardiology with low suspicion for cardiac etiology.  He has continued to have frequent episodes of chest pain throughout the day and night since his discharge on 08/09/2020, seems to be worse when he lays down, involving the upper chest and shoulders, improved with nitroglycerin and sitting upright.  He has been sleeping in a recliner for the past few days due to the symptoms.  He denies any recent change in his exertional dyspnea and denies any fevers or chills.  He denies headache, change in vision, or focal numbness or weakness. Bilateral lower extremity venous Dopplers were negative for DVT.  CTA chest with no change in bilateral pleural effusions and pulmonary fibrosis. High-sensitivity troponin was normal x3.  Cardiology and oncology were consulted.   New events last 24 hours / Subjective: Patient admits to continued chest pain which is across his entire chest, associated with shortness of breath.  It waxes and wanes in severity but does not completely go away.  Assessment & Plan:   Principal Problem:   Chest pain Active Problems:   CAD (coronary artery disease)   Hypertension complicating diabetes ([place])   Anemia of chronic disease   AAA (abdominal aortic aneurysm) ([name])-infrarenal 3.4 cm   Type 2 diabetes mellitus with diabetic neuropathy, without long-term current use of insulin ([name])   MDS/MPN (myelodysplastic/myeloproliferative neoplasms) ([name])   Chronic low back pain without sciatica   Rheumatoid arthritis  ([place])   Pulmonary fibrosis ([name])   Chronic kidney disease, stage 3a ([name])   Thrombocytopenia ([name])   Recurrent chest pain -Troponin negative -CTA chest negative for PE -Cardiology signed off today, did not feel that this was cardiac in nature, recommended hematology/oncology to evaluate for leukostasis  CAD -Continue aspirin, Plavix, zocor, lopressor, Imdur  Chronic diastolic heart failure -Does not appear volume overloaded, does have some chronic venous insufficiency  MDS -Oncology consulted  Pulmonary fibrosis -Remains stable at this time  Rheumatoid arthritis -Continue leflunomide  CKD stage IIIa -Baseline creatinine around 1.2-1.7  -Stable  Hypertension -Continue Norvasc, Lopressor   DVT prophylaxis:  enoxaparin (LOVENOX) injection 40 mg Start: 08/09/2020 2145  Code Status:     Code Status Orders  (From admission, onward)           Start     Ordered   08/13/2020 2136  Full code  Continuous        08/15/2020 2138           Code Status History     Date Active Date Inactive Code Status Order ID Comments User Context   08/07/2020 1710 08/09/2020 2035 Full Code [id number]  Orma Flaming, MD ED   12/01/2019 2325 12/06/2019 1940 Full Code [id number]  Shela Leff, MD ED   09/02/2018 0452 09/03/2018 2059 Full Code [id number]  Elwyn Reach, MD ED   12/04/2015 1630 12/08/2015 1941 Full Code [id number]  Samella Parr, NP Inpatient   04/18/2015 0819 04/22/2015 2338 Full Code [id number]  Robbie Lis, MD Inpatient      Advance Directive Documentation    Flowsheet  Row Most Recent Value  Type of Advance Directive Healthcare Power of Attorney, Living will  Pre-existing out of facility DNR order (yellow form or pink MOST form) --  "MOST" Form in Place? --      Family Communication: Wife and sister at bedside Disposition Plan:  Status is: Observation  The patient will require care spanning > 2 midnights and should be moved to inpatient because: Ongoing  diagnostic testing needed not appropriate for outpatient work up  [place]: The patient is from: Home              Anticipated d/c is to: Home              Patient currently is not medically stable to d/c.   Difficult to place patient No      Consultants:  Cardiology Oncology   Antimicrobials:  Anti-infectives (From admission, onward)    None        Objective: Vitals:   08/15/20 0300 08/15/20 0423 08/15/20 0745 08/15/20 0935  BP:  (!) 123/58 (!) 119/49 (!) 123/48  Pulse:  (!) 57 (!) 59 65  Resp:  18 17   Temp:  97.7 F (36.5 C)    TempSrc:  Oral Oral   SpO2:  98% 96%   Weight: 73.7 kg     Height: 5\' 10"  (1.778 m)       Intake/Output Summary (Last 24 hours) at 08/15/2020 1220 Last data filed at 08/15/2020 0754 Gross per 24 hour  Intake 485.88 ml  Output 350 ml  Net 135.88 ml   Filed Weights   08/16/2020 1251 08/15/20 0300  Weight: 72.6 kg 73.7 kg    Examination:  General exam: Appears calm and comfortable  Respiratory system: Clear to auscultation. Respiratory effort normal. No respiratory distress. No conversational dyspnea.  Cardiovascular system: S1 & S2 heard, RRR. No murmurs. No pedal edema. Gastrointestinal system: Abdomen is nondistended, soft and nontender. Normal bowel sounds heard. Central nervous system: Alert and oriented. No focal neurological deficits. Speech clear.  Extremities: Symmetric in appearance  Skin: No rashes, lesions or ulcers on exposed skin  Psychiatry: Judgement and insight appear normal. Mood & affect appropriate.   Data Reviewed: I have personally reviewed following labs and imaging studies  CBC: Recent Labs  Lab 08/09/20 0046 08/25/2020 1257 08/15/20 0241  WBC 128.6* 131.5* 127.9*  NEUTROABS  --   --  73.4*  HGB 7.7* 8.0* 7.8*  HCT 22.4* 24.0* 22.9*  MCV 82.4 83.9 83.0  PLT 160 106* 408*   Basic Metabolic Panel: Recent Labs  Lab 08/09/20 0046 08/27/2020 1257 08/15/20 0241  NA 126* 130* 129*  K 5.5* 4.7 4.8  CL 101  102 102  CO2 22 21* 22  GLUCOSE 124* 131* 115*  BUN 46* 39* 38*  CREATININE 1.69* 1.47* 1.39*  CALCIUM 8.4* 8.5* 8.4*   GFR: Estimated Creatinine Clearance: 45.2 mL/min (A) (by C-G formula based on SCr of 1.39 mg/dL (H)). Liver Function Tests: No results for input(s): AST, ALT, ALKPHOS, BILITOT, PROT, ALBUMIN in the last 168 hours. No results for input(s): LIPASE, AMYLASE in the last 168 hours. No results for input(s): AMMONIA in the last 168 hours. Coagulation Profile: No results for input(s): INR, PROTIME in the last 168 hours. Cardiac Enzymes: No results for input(s): CKTOTAL, CKMB, CKMBINDEX, TROPONINI in the last 168 hours. BNP (last 3 results) No results for input(s): PROBNP in the last 8760 hours. HbA1C: No results for input(s): HGBA1C in the last 72 hours. CBG: Recent  Labs  Lab 08/08/20 1649 08/08/20 2056 08/09/20 0757 08/09/20 1149  GLUCAP 157* 183* 103* 157*   Lipid Profile: No results for input(s): CHOL, HDL, LDLCALC, TRIG, CHOLHDL, LDLDIRECT in the last 72 hours. Thyroid Function Tests: No results for input(s): TSH, T4TOTAL, FREET4, T3FREE, THYROIDAB in the last 72 hours. Anemia Panel: No results for input(s): VITAMINB12, FOLATE, FERRITIN, TIBC, IRON, RETICCTPCT in the last 72 hours. Sepsis Labs: No results for input(s): PROCALCITON, LATICACIDVEN in the last 168 hours.  Recent Results (from the past 240 hour(s))  Resp Panel by RT-PCR (Flu A&B, Covid) Nasopharyngeal Swab     Status: None   Collection Time: 08/07/20  1:01 PM   Specimen: Nasopharyngeal Swab; Nasopharyngeal(NP) swabs in vial transport medium  Result Value Ref Range Status   SARS Coronavirus 2 by RT PCR NEGATIVE NEGATIVE Final    Comment: (NOTE) SARS-CoV-2 target nucleic acids are NOT DETECTED.  The SARS-CoV-2 RNA is generally detectable in upper respiratory specimens during the acute phase of infection. The lowest concentration of SARS-CoV-2 viral copies this assay can detect is 138  copies/mL. A negative result does not preclude SARS-Cov-2 infection and should not be used as the sole basis for treatment or other patient management decisions. A negative result may occur with  improper specimen collection/handling, submission of specimen other than nasopharyngeal swab, presence of viral mutation(s) within the areas targeted by this assay, and inadequate number of viral copies(<138 copies/mL). A negative result must be combined with clinical observations, patient history, and epidemiological information. The expected result is Negative.  Fact Sheet for Patients:  EntrepreneurPulse.com.au  Fact Sheet for Healthcare Providers:  IncredibleEmployment.be  This test is no t yet approved or cleared by the [place] FDA and  has been authorized for detection and/or diagnosis of SARS-CoV-2 by FDA under an Emergency Use Authorization (EUA). This EUA will remain  in effect (meaning this test can be used) for the duration of the COVID-19 declaration under Section 564(b)(1) of the Act, 21 U.S.C.section 360bbb-3(b)(1), unless the authorization is terminated  or revoked sooner.       Influenza A by PCR NEGATIVE NEGATIVE Final   Influenza B by PCR NEGATIVE NEGATIVE Final    Comment: (NOTE) The Xpert Xpress SARS-CoV-2/FLU/RSV plus assay is intended as an aid in the diagnosis of influenza from Nasopharyngeal swab specimens and should not be used as a sole basis for treatment. Nasal washings and aspirates are unacceptable for Xpert Xpress SARS-CoV-2/FLU/RSV testing.  Fact Sheet for Patients: EntrepreneurPulse.com.au  Fact Sheet for Healthcare Providers: IncredibleEmployment.be  This test is not yet approved or cleared by the [place] FDA and has been authorized for detection and/or diagnosis of SARS-CoV-2 by FDA under an Emergency Use Authorization (EUA). This EUA will remain in effect (meaning  this test can be used) for the duration of the COVID-19 declaration under Section 564(b)(1) of the Act, 21 U.S.C. section 360bbb-3(b)(1), unless the authorization is terminated or revoked.  Performed at [facility], [place] [address] [place], [place] [address]   SARS CORONAVIRUS 2 (TAT 6-24 HRS) Nasopharyngeal Nasopharyngeal Swab     Status: None   Collection Time: 08/31/2020  8:35 PM   Specimen: Nasopharyngeal Swab  Result Value Ref Range Status   SARS Coronavirus 2 NEGATIVE NEGATIVE Final    Comment: (NOTE) SARS-CoV-2 target nucleic acids are NOT DETECTED.  The SARS-CoV-2 RNA is generally detectable in upper and lower respiratory specimens during the acute phase of infection. Negative results do not preclude SARS-CoV-2 infection, do  not rule out co-infections with other pathogens, and should not be used as the sole basis for treatment or other patient management decisions. Negative results must be combined with clinical observations, patient history, and epidemiological information. The expected result is Negative.  Fact Sheet for Patients: SugarRoll.be  Fact Sheet for Healthcare Providers: https://www.woods-mathews.com/  This test is not yet approved or cleared by the [place] FDA and  has been authorized for detection and/or diagnosis of SARS-CoV-2 by FDA under an Emergency Use Authorization (EUA). This EUA will remain  in effect (meaning this test can be used) for the duration of the COVID-19 declaration under Se ction 564(b)(1) of the Act, 21 U.S.C. section 360bbb-3(b)(1), unless the authorization is terminated or revoked sooner.  Performed at [facility], [place] [address], [place], [place] [address]       Radiology Studies: CT Angio Chest PE W and/or Wo Contrast  Result Date: 08/08/2020 CLINICAL DATA:  Chest pain.  Shortness of breath. EXAM: CT ANGIOGRAPHY CHEST WITH CONTRAST TECHNIQUE: Multidetector CT  imaging of the chest was performed using the standard protocol during bolus administration of intravenous contrast. Multiplanar CT image reconstructions and MIPs were obtained to evaluate the vascular anatomy. CONTRAST:  30mL OMNIPAQUE IOHEXOL 350 MG/ML SOLN COMPARISON:  Plain film of earlier today.  CT 07/14/2020. FINDINGS: Cardiovascular: The quality of this exam for evaluation of pulmonary embolism is good. No evidence of pulmonary embolism. Pulmonary artery enlargement, outflow tract 3.1 cm Aorta not well opacified. Aortic atherosclerosis. Tortuous thoracic aorta. Mild cardiomegaly. Three vessel coronary artery calcification. Mediastinum/Nodes: Prominent mediastinal nodes are again identified, favored to be reactive. Example at up to 1.1 cm in the high right paratracheal station. Lungs/Pleura: Small left greater than right pleural effusions are unchanged. Bilateral calcified pleural plaques. Right greater than left apical pleuroparenchymal scarring. 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 Basilar and peripheral predominant subpleural reticulation with scattered basilar honeycombing. Right lower lobe calcified granuloma. Right middle lobe 5 mm pulmonary nodule is similar on 107/6. Upper Abdomen: Old granulomatous disease in the liver and spleen. Normal imaged portions of the stomach, gallbladder, pancreas, adrenal glands, kidneys. Abdominal aortic atherosclerosis. Musculoskeletal: Lower thoracic spondylosis with mild wedging of the T12 vertebral body. Review of the MIP images confirms the above findings. IMPRESSION: 1.  No evidence of pulmonary embolism. 2. No significant change compared to the CT of 07/14/2020. 3. Interstitial lung disease, consistent with asbestosis and asbestos related pleural disease. 4. Small bilateral pleural effusions. 5. Coronary artery atherosclerosis. Aortic Atherosclerosis (ICD10-I70.0). 6. Pulmonary artery enlargement suggests pulmonary  arterial hypertension. Electronically Signed   By: Abigail Miyamoto M.D.   On: 08/03/2020 19:18   DG Chest Port 1 View  Result Date: 08/22/2020 CLINICAL DATA:  Chest pain and shortness of breath. Pulmonary fibrosis. EXAM: PORTABLE CHEST 1 VIEW COMPARISON:  08/07/2020 FINDINGS: Stable cardiomediastinal contours. Persistent bilateral pleural effusions identified. Chronic reticular interstitial opacities with a lower lung zone predominance is again seen compatible with diagnosis of pulmonary fibrosis. No superimposed airspace consolidation. IMPRESSION: No change in bilateral pleural effusions and pulmonary fibrosis. Electronically Signed   By: Kerby Moors M.D.   On: 08/10/2020 13:30   VAS [place] LOWER EXTREMITY VENOUS (DVT) (ONLY [facility] & [facility])  Result Date: 08/29/2020  Lower Venous DVT Study Patient Name:  KIYAN BURMESTER  Date of Exam:   08/25/2020 Medical Rec #: [id number]           Accession #:    [id number] Date  of Birth: [birth year]            Patient Gender: M Patient Age:   79Y Exam Location:  [facility] Procedure:      VAS [place] LOWER EXTREMITY VENOUS (DVT) Referring Phys: [id number] JULIE HAVILAND --------------------------------------------------------------------------------  Indications: Swelling, and SOB.  Comparison Study: No prior study Performing Technologist: Maudry Mayhew MHA, RDMS, RVT, RDCS  Examination Guidelines: A complete evaluation includes B-mode imaging, spectral Doppler, color Doppler, and power Doppler as needed of all accessible portions of each vessel. Bilateral testing is considered an integral part of a complete examination. Limited examinations for reoccurring indications may be performed as noted. The reflux portion of the exam is performed with the patient in reverse Trendelenburg.  +---------+---------------+---------+-----------+----------+--------------+ RIGHT    CompressibilityPhasicitySpontaneityPropertiesThrombus Aging  +---------+---------------+---------+-----------+----------+--------------+ CFV      Full           Yes      Yes                                 +---------+---------------+---------+-----------+----------+--------------+ SFJ      Full                                                        +---------+---------------+---------+-----------+----------+--------------+ FV Prox  Full                                                        +---------+---------------+---------+-----------+----------+--------------+ FV Mid   Full                                                        +---------+---------------+---------+-----------+----------+--------------+ FV DistalFull                                                        +---------+---------------+---------+-----------+----------+--------------+ PFV      Full                                                        +---------+---------------+---------+-----------+----------+--------------+ POP      Full           Yes      Yes                                 +---------+---------------+---------+-----------+----------+--------------+ PTV      Full                                                        +---------+---------------+---------+-----------+----------+--------------+  PERO     Full                                                        +---------+---------------+---------+-----------+----------+--------------+   +---------+---------------+---------+-----------+----------+--------------+ LEFT     CompressibilityPhasicitySpontaneityPropertiesThrombus Aging +---------+---------------+---------+-----------+----------+--------------+ CFV      Full           Yes      Yes                                 +---------+---------------+---------+-----------+----------+--------------+ SFJ      Full                                                         +---------+---------------+---------+-----------+----------+--------------+ FV Prox  Full                                                        +---------+---------------+---------+-----------+----------+--------------+ FV Mid   Full                                                        +---------+---------------+---------+-----------+----------+--------------+ FV DistalFull                                                        +---------+---------------+---------+-----------+----------+--------------+ PFV      Full                                                        +---------+---------------+---------+-----------+----------+--------------+ POP      Full           Yes      Yes                                 +---------+---------------+---------+-----------+----------+--------------+ PTV      Full                                                        +---------+---------------+---------+-----------+----------+--------------+ PERO     Full                                                        +---------+---------------+---------+-----------+----------+--------------+  Summary: BILATERAL: - No evidence of deep vein thrombosis seen in the lower extremities, bilaterally. -No evidence of popliteal cyst, bilaterally.   *See table(s) above for measurements and observations. Electronically signed by Jamelle Haring on 08/30/2020 at 5:13:17 PM.    Final       Scheduled Meds:  amLODipine  5 mg Oral Daily   aspirin  81 mg Oral Daily   clopidogrel  75 mg Oral Daily   enoxaparin (LOVENOX) injection  40 mg Subcutaneous Q24H   febuxostat  40 mg Oral Daily   fluticasone furoate-vilanterol  1 puff Inhalation Daily   isosorbide mononitrate  60 mg Oral QHS   leflunomide  20 mg Oral Daily   metoprolol tartrate  25 mg Oral BID   simvastatin  20 mg Oral QHS   umeclidinium bromide  1 puff Inhalation Daily   Continuous Infusions:   LOS: 0 days      Time spent:  25 minutes   Dessa Phi, DO [facility] 08/15/2020, 12:20 PM   Available via Epic secure chat 7am-7pm After these hours, please refer to coverage provider listed on [email or web address]

## 2020-08-15 NOTE — Progress Notes (Signed)
DAILY PROGRESS NOTE   Patient Name: Paul [name] Date of Encounter: 08/15/2020 Cardiologist: Lauree Chandler, MD  Chief Complaint   Chest pain  Patient Profile   Paul [name] is a 80 y.o. male with a history of CAD with known CTO of RCA with moderate disease of LAD and LCX on cardiac catheterization in 2010 and recent low risk Myoview in 07/2020, bilateral carotid artery disease, AAA, pulmonary fibrosis with continued tobacco use, hypertension, hyperlipidemia, diabetes mellitus, prior DVT, myelodysplastic disease, anemia of chronic disease, GERD, and chronic back pain who is being seen for the evaluation of chest pain at the request of Dr. Roslynn Amble.  Subjective   CT angio negative for PE yesterday. Troponins all negative. WBC count remains high - will need hemonc evaluation. Creatinine improved with fluids. Only thing that helps his pain is pain medicine.  Objective   Vitals:   08/15/20 0300 08/15/20 0423 08/15/20 0745 08/15/20 0935  BP:  (!) 123/58 (!) 119/49 (!) 123/48  Pulse:  (!) 57 (!) 59 65  Resp:  18 17   Temp:  97.7 F (36.5 C)    TempSrc:  Oral Oral   SpO2:  98% 96%   Weight: 73.7 kg     Height: 5\' 10"  (1.778 m)       Intake/Output Summary (Last 24 hours) at 08/15/2020 0936 Last data filed at 08/15/2020 0754 Gross per 24 hour  Intake 485.88 ml  Output 350 ml  Net 135.88 ml   Filed Weights   08/02/2020 1251 08/15/20 0300  Weight: 72.6 kg 73.7 kg    Physical Exam   General appearance: alert and no distress Lungs: clear to auscultation bilaterally Heart: regular rate and rhythm Extremities: extremities normal, atraumatic, no cyanosis or edema Neurologic: Grossly normal  Inpatient Medications    Scheduled Meds:  amLODipine  5 mg Oral Daily   aspirin  81 mg Oral Daily   clopidogrel  75 mg Oral Daily   enoxaparin (LOVENOX) injection  40 mg Subcutaneous Q24H   febuxostat  40 mg Oral Daily   fluticasone furoate-vilanterol  1 puff Inhalation  Daily   isosorbide mononitrate  60 mg Oral QHS   leflunomide  20 mg Oral Daily   metoprolol tartrate  25 mg Oral BID   simvastatin  20 mg Oral QHS   umeclidinium bromide  1 puff Inhalation Daily    Continuous Infusions:   PRN Meds: acetaminophen **OR** acetaminophen, morphine injection, ondansetron **OR** ondansetron (ZOFRAN) IV, oxyCODONE, senna-docusate   Labs   Results for orders placed or performed during the hospital encounter of 08/27/2020 (from the past 48 hour(s))  Basic metabolic panel     Status: Abnormal   Collection Time: 08/27/2020 12:57 PM  Result Value Ref Range   Sodium 130 (L) 135 - 145 mmol/L   Potassium 4.7 3.5 - 5.1 mmol/L   Chloride 102 98 - 111 mmol/L   CO2 21 (L) 22 - 32 mmol/L   Glucose, Bld 131 (H) 70 - 99 mg/dL    Comment: Glucose reference range applies only to samples taken after fasting for at least 8 hours.   BUN 39 (H) 8 - 23 mg/dL   Creatinine, Ser 1.47 (H) 0.61 - 1.24 mg/dL   Calcium 8.5 (L) 8.9 - 10.3 mg/dL   GFR, Estimated 49 (L) >60 mL/min    Comment: (NOTE) Calculated using the CKD-EPI Creatinine Equation (2021)    Anion gap 7 5 - 15    Comment: Performed at [facility], [place] [address] [place], [place] [address]  CBC     Status: Abnormal   Collection Time: 08/19/2020 12:57 PM  Result Value Ref Range   WBC 131.5 (HH) 4.0 - 10.5 K/uL    Comment: REPEATED TO VERIFY THIS CRITICAL RESULT HAS VERIFIED AND BEEN CALLED TO H DOOLEY RN BY CANDACE HAYES ON 07 14 2022 AT 6045, AND HAS BEEN READ BACK.     RBC 2.86 (L) 4.22 - 5.81 MIL/uL   Hemoglobin 8.0 (L) 13.0 - 17.0 g/dL   HCT 24.0 (L) 39.0 - 52.0 %   MCV 83.9 80.0 - 100.0 fL   MCH 28.0 26.0 - 34.0 pg   MCHC 33.3 30.0 - 36.0 g/dL   RDW 21.5 (H) 11.5 - 15.5 %   Platelets 106 (L) 150 - 400 K/uL    Comment: REPEATED TO VERIFY PLATELET COUNT CONFIRMED BY SMEAR    nRBC 0.2 0.0 - 0.2 %    Comment: Performed at [facility], [place] [address], [place], [place] [address]  Troponin I (High  Sensitivity)     Status: None   Collection Time: 08/10/2020 12:57 PM  Result Value Ref Range   Troponin I (High Sensitivity) 10 <18 ng/L    Comment: (NOTE) Elevated high sensitivity troponin I (hsTnI) values and significant  changes across serial measurements may suggest ACS but many other  chronic and acute conditions are known to elevate hsTnI results.  Refer to the "Links" section for chest pain algorithms and additional  guidance. Performed at [facility], [place] [address], [place], [place] [address]   D-dimer, quantitative     Status: Abnormal   Collection Time: 08/03/2020 12:57 PM  Result Value Ref Range   D-Dimer, Quant 1.47 (H) 0.00 - 0.50 ug/mL-FEU    Comment: (NOTE) At the manufacturer cut-off value of 0.5 g/mL FEU, this assay has a negative predictive value of 95-100%.This assay is intended for use in conjunction with a clinical pretest probability (PTP) assessment model to exclude pulmonary embolism (PE) and deep venous thrombosis (DVT) in outpatients suspected of PE or DVT. Results should be correlated with clinical presentation. Performed at [facility], [place] [address], [place], [place] [address]   Troponin I (High Sensitivity)     Status: None   Collection Time: 08/11/2020  3:00 PM  Result Value Ref Range   Troponin I (High Sensitivity) 12 <18 ng/L    Comment: (NOTE) Elevated high sensitivity troponin I (hsTnI) values and significant  changes across serial measurements may suggest ACS but many other  chronic and acute conditions are known to elevate hsTnI results.  Refer to the "Links" section for chest pain algorithms and additional  guidance. Performed at [facility], [place] [address], [place], [place] [address]   Troponin I (High Sensitivity)     Status: None   Collection Time: 08/12/2020  6:32 PM  Result Value Ref Range   Troponin I (High Sensitivity) 10 <18 ng/L    Comment: (NOTE) Elevated high sensitivity troponin I (hsTnI) values and  significant  changes across serial measurements may suggest ACS but many other  chronic and acute conditions are known to elevate hsTnI results.  Refer to the "Links" section for chest pain algorithms and additional  guidance. Performed at [facility], [place] [address] [place], [place] [address]   SARS CORONAVIRUS 2 (TAT 6-24 HRS) Nasopharyngeal Nasopharyngeal Swab     Status: None   Collection Time: 08/25/2020  8:35 PM   Specimen: Nasopharyngeal Swab  Result Value Ref Range   SARS Coronavirus 2 NEGATIVE NEGATIVE    Comment: (NOTE) SARS-CoV-2 target nucleic acids are NOT DETECTED.  The SARS-CoV-2 RNA is generally detectable in upper and lower respiratory specimens during the acute phase of infection. Negative results do not preclude SARS-CoV-2 infection, do not rule out co-infections with other pathogens, and should not be used as the sole basis for treatment or other patient management decisions. Negative results must be combined with clinical observations, patient history, and epidemiological information. The expected result is Negative.  Fact Sheet for Patients: SugarRoll.be  Fact Sheet for Healthcare Providers: https://www.woods-mathews.com/  This test is not yet approved or cleared by the [place] FDA and  has been authorized for detection and/or diagnosis of SARS-CoV-2 by FDA under an Emergency Use Authorization (EUA). This EUA will remain  in effect (meaning this test can be used) for the duration of the COVID-19 declaration under Se ction 564(b)(1) of the Act, 21 U.S.C. section 360bbb-3(b)(1), unless the authorization is terminated or revoked sooner.  Performed at [facility], [place] [address], [place], [place] [address]   Troponin I (High Sensitivity)     Status: None   Collection Time: 08/03/2020 10:06 PM  Result Value Ref Range   Troponin I (High Sensitivity) 8 <18 ng/L    Comment: (NOTE) Elevated high  sensitivity troponin I (hsTnI) values and significant  changes across serial measurements may suggest ACS but many other  chronic and acute conditions are known to elevate hsTnI results.  Refer to the "Links" section for chest pain algorithms and additional  guidance. Performed at [facility], [place] [address], [place], [place] [address]   Basic metabolic panel     Status: Abnormal   Collection Time: 08/15/20  2:41 AM  Result Value Ref Range   Sodium 129 (L) 135 - 145 mmol/L   Potassium 4.8 3.5 - 5.1 mmol/L   Chloride 102 98 - 111 mmol/L   CO2 22 22 - 32 mmol/L   Glucose, Bld 115 (H) 70 - 99 mg/dL    Comment: Glucose reference range applies only to samples taken after fasting for at least 8 hours.   BUN 38 (H) 8 - 23 mg/dL   Creatinine, Ser 1.39 (H) 0.61 - 1.24 mg/dL   Calcium 8.4 (L) 8.9 - 10.3 mg/dL   GFR, Estimated 52 (L) >60 mL/min    Comment: (NOTE) Calculated using the CKD-EPI Creatinine Equation (2021)    Anion gap 5 5 - 15    Comment: Performed at [facility] [address] [place], [place] [address]  CBC WITH DIFFERENTIAL     Status: Abnormal   Collection Time: 08/15/20  2:41 AM  Result Value Ref Range   WBC 127.9 (HH) 4.0 - 10.5 K/uL    Comment: CRITICAL VALUE NOTED.  VALUE IS CONSISTENT WITH PREVIOUSLY REPORTED AND CALLED VALUE. REPEATED TO VERIFY    RBC 2.76 (L) 4.22 - 5.81 MIL/uL   Hemoglobin 7.8 (L) 13.0 - 17.0 g/dL   HCT 22.9 (L) 39.0 - 52.0 %   MCV 83.0 80.0 - 100.0 fL   MCH 28.3 26.0 - 34.0 pg   MCHC 34.1 30.0 - 36.0 g/dL   RDW 21.9 (H) 11.5 - 15.5 %   Platelets 102 (L) 150 - 400 K/uL    Comment: Immature Platelet Fraction may be clinically indicated, consider ordering this additional test [id number] CONSISTENT WITH PREVIOUS RESULT REPEATED TO  VERIFY    nRBC 0.2 0.0 - 0.2 %   Neutrophils Relative % 57 %   Neutro Abs 73.4 (H) 1.7 - 7.7 K/uL   Lymphocytes Relative 8 %   Lymphs Abs 9.6 (H) 0.7 - 4.0 K/uL   Monocytes Relative 10 %    Monocytes Absolute 12.5 (H) 0.1 - 1.0 K/uL   Eosinophils Relative 1 %   Eosinophils Absolute 1.4 (H) 0.0 - 0.5 K/uL   Basophils Relative 0 %   Basophils Absolute 0.4 (H) 0.0 - 0.1 K/uL   Immature Granulocytes 24 %   Abs Immature Granulocytes 30.62 (H) 0.00 - 0.07 K/uL    Comment: Performed at [facility] [name] [place], [place] [address]    ECG   N/A  Telemetry   Sinus - Personally Reviewed  Radiology    CT Angio Chest PE W and/or Wo Contrast  Result Date: 08/02/2020 CLINICAL DATA:  Chest pain.  Shortness of breath. EXAM: CT ANGIOGRAPHY CHEST WITH CONTRAST TECHNIQUE: Multidetector CT imaging of the chest was performed using the standard protocol during bolus administration of intravenous contrast. Multiplanar CT image reconstructions and MIPs were obtained to evaluate the vascular anatomy. CONTRAST:  84mL OMNIPAQUE IOHEXOL 350 MG/ML SOLN COMPARISON:  Plain film of earlier today.  CT 07/14/2020. FINDINGS: Cardiovascular: The quality of this exam for evaluation of pulmonary embolism is good. No evidence of pulmonary embolism. Pulmonary artery enlargement, outflow tract 3.1 cm Aorta not well opacified. Aortic atherosclerosis. Tortuous thoracic aorta. Mild cardiomegaly. Three vessel coronary artery calcification. Mediastinum/Nodes: Prominent mediastinal nodes are again identified, favored to be reactive. Example at up to 1.1 cm in the high right paratracheal station. Lungs/Pleura: Small left greater than right pleural effusions are unchanged. Bilateral calcified pleural plaques. Right greater than left apical pleuroparenchymal scarring. 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 Basilar and peripheral predominant subpleural reticulation with scattered basilar honeycombing. Right lower lobe calcified granuloma. Right middle lobe 5 mm pulmonary nodule is similar on 107/6. Upper Abdomen: Old granulomatous disease in the liver and spleen. Normal  imaged portions of the stomach, gallbladder, pancreas, adrenal glands, kidneys. Abdominal aortic atherosclerosis. Musculoskeletal: Lower thoracic spondylosis with mild wedging of the T12 vertebral body. Review of the MIP images confirms the above findings. IMPRESSION: 1.  No evidence of pulmonary embolism. 2. No significant change compared to the CT of 07/14/2020. 3. Interstitial lung disease, consistent with asbestosis and asbestos related pleural disease. 4. Small bilateral pleural effusions. 5. Coronary artery atherosclerosis. Aortic Atherosclerosis (ICD10-I70.0). 6. Pulmonary artery enlargement suggests pulmonary arterial hypertension. Electronically Signed   By: Abigail Miyamoto M.D.   On: 08/11/2020 19:18   DG Chest Port 1 View  Result Date: 08/29/2020 CLINICAL DATA:  Chest pain and shortness of breath. Pulmonary fibrosis. EXAM: PORTABLE CHEST 1 VIEW COMPARISON:  08/07/2020 FINDINGS: Stable cardiomediastinal contours. Persistent bilateral pleural effusions identified. Chronic reticular interstitial opacities with a lower lung zone predominance is again seen compatible with diagnosis of pulmonary fibrosis. No superimposed airspace consolidation. IMPRESSION: No change in bilateral pleural effusions and pulmonary fibrosis. Electronically Signed   By: Kerby Moors M.D.   On: 08/22/2020 13:30   VAS [place] LOWER EXTREMITY VENOUS (DVT) (ONLY [facility] & [facility])  Result Date: 08/04/2020  Lower Venous DVT Study Patient Name:  Paul [name]  Date of Exam:   08/02/2020 Medical Rec #: [id number]           Accession #:    [id number] Date of Birth: 10-11-1940  Patient Gender: M Patient Age:   78Y Exam Location:  [facility] Procedure:      VAS [place] LOWER EXTREMITY VENOUS (DVT) Referring Phys: [id number] JULIE HAVILAND --------------------------------------------------------------------------------  Indications: Swelling, and SOB.  Comparison Study: No prior study Performing Technologist: Maudry Mayhew MHA, RDMS,  RVT, RDCS  Examination Guidelines: A complete evaluation includes B-mode imaging, spectral Doppler, color Doppler, and power Doppler as needed of all accessible portions of each vessel. Bilateral testing is considered an integral part of a complete examination. Limited examinations for reoccurring indications may be performed as noted. The reflux portion of the exam is performed with the patient in reverse Trendelenburg.  +---------+---------------+---------+-----------+----------+--------------+ RIGHT    CompressibilityPhasicitySpontaneityPropertiesThrombus Aging +---------+---------------+---------+-----------+----------+--------------+ CFV      Full           Yes      Yes                                 +---------+---------------+---------+-----------+----------+--------------+ SFJ      Full                                                        +---------+---------------+---------+-----------+----------+--------------+ FV Prox  Full                                                        +---------+---------------+---------+-----------+----------+--------------+ FV Mid   Full                                                        +---------+---------------+---------+-----------+----------+--------------+ FV DistalFull                                                        +---------+---------------+---------+-----------+----------+--------------+ PFV      Full                                                        +---------+---------------+---------+-----------+----------+--------------+ POP      Full           Yes      Yes                                 +---------+---------------+---------+-----------+----------+--------------+ PTV      Full                                                        +---------+---------------+---------+-----------+----------+--------------+  PERO     Full                                                         +---------+---------------+---------+-----------+----------+--------------+   +---------+---------------+---------+-----------+----------+--------------+ LEFT     CompressibilityPhasicitySpontaneityPropertiesThrombus Aging +---------+---------------+---------+-----------+----------+--------------+ CFV      Full           Yes      Yes                                 +---------+---------------+---------+-----------+----------+--------------+ SFJ      Full                                                        +---------+---------------+---------+-----------+----------+--------------+ FV Prox  Full                                                        +---------+---------------+---------+-----------+----------+--------------+ FV Mid   Full                                                        +---------+---------------+---------+-----------+----------+--------------+ FV DistalFull                                                        +---------+---------------+---------+-----------+----------+--------------+ PFV      Full                                                        +---------+---------------+---------+-----------+----------+--------------+ POP      Full           Yes      Yes                                 +---------+---------------+---------+-----------+----------+--------------+ PTV      Full                                                        +---------+---------------+---------+-----------+----------+--------------+ PERO     Full                                                        +---------+---------------+---------+-----------+----------+--------------+  Summary: BILATERAL: - No evidence of deep vein thrombosis seen in the lower extremities, bilaterally. -No evidence of popliteal cyst, bilaterally.   *See table(s) above for measurements and observations. Electronically signed by Jamelle Haring on 08/09/2020 at 5:13:17 PM.     Final     Cardiac Studies   N/A  Assessment   Principal Problem:   Chest pain Active Problems:   CAD (coronary artery disease)   Hypertension complicating diabetes ([place])   Anemia of chronic disease   AAA (abdominal aortic aneurysm) ([name])-infrarenal 3.4 cm   Type 2 diabetes mellitus with diabetic neuropathy, without long-term current use of insulin ([name])   MDS/MPN (myelodysplastic/myeloproliferative neoplasms) ([name])   Chronic low back pain without sciatica   Rheumatoid arthritis ([place])   Pulmonary fibrosis ([name])   Chronic kidney disease, stage 3a ([name])   Thrombocytopenia ([place])   Plan   Chest pain is not cardiac- suspect again it is related to very high WBC count - wonder if MDS has transformed into AML - hem/onc to evaluate today. Labs a little improved with fluids, but hesitant to overload given history of CKD and diastolic CHF  [facility] will sign off.   Medication Recommendations:  none Other recommendations (labs, testing, etc):  none Follow up as an outpatient:  Dr. Angelena Form   Time Spent Directly with Patient:  I have spent a total of 25 minutes with the patient reviewing hospital notes, telemetry, EKGs, labs and examining the patient as well as establishing an assessment and plan that was discussed personally with the patient.  > 50% of time was spent in direct patient care.  Length of Stay:  LOS: 0 days   Pixie Casino, MD, [facility], [place] Director of the Advanced Lipid Disorders &  Cardiovascular Risk Reduction Clinic Diplomate of the American Board of Clinical Lipidology Attending Cardiologist  Direct Dial: [phone]  Fax: [phone]  Website:  [email or web address] 08/15/2020, 9:36 AM

## 2020-08-16 DIAGNOSIS — D631 Anemia in chronic kidney disease: Secondary | ICD-10-CM | POA: Diagnosis present

## 2020-08-16 DIAGNOSIS — N1832 Chronic kidney disease, stage 3b: Secondary | ICD-10-CM | POA: Diagnosis present

## 2020-08-16 DIAGNOSIS — M109 Gout, unspecified: Secondary | ICD-10-CM | POA: Diagnosis present

## 2020-08-16 DIAGNOSIS — M069 Rheumatoid arthritis, unspecified: Secondary | ICD-10-CM | POA: Diagnosis present

## 2020-08-16 DIAGNOSIS — Z66 Do not resuscitate: Secondary | ICD-10-CM | POA: Diagnosis not present

## 2020-08-16 DIAGNOSIS — Z515 Encounter for palliative care: Secondary | ICD-10-CM | POA: Diagnosis not present

## 2020-08-16 DIAGNOSIS — K219 Gastro-esophageal reflux disease without esophagitis: Secondary | ICD-10-CM | POA: Diagnosis present

## 2020-08-16 DIAGNOSIS — E883 Tumor lysis syndrome: Secondary | ICD-10-CM | POA: Diagnosis present

## 2020-08-16 DIAGNOSIS — I358 Other nonrheumatic aortic valve disorders: Secondary | ICD-10-CM | POA: Diagnosis present

## 2020-08-16 DIAGNOSIS — I5032 Chronic diastolic (congestive) heart failure: Secondary | ICD-10-CM | POA: Diagnosis present

## 2020-08-16 DIAGNOSIS — I700 Atherosclerosis of aorta: Secondary | ICD-10-CM | POA: Diagnosis present

## 2020-08-16 DIAGNOSIS — I130 Hypertensive heart and chronic kidney disease with heart failure and stage 1 through stage 4 chronic kidney disease, or unspecified chronic kidney disease: Secondary | ICD-10-CM | POA: Diagnosis present

## 2020-08-16 DIAGNOSIS — Z7189 Other specified counseling: Secondary | ICD-10-CM | POA: Diagnosis not present

## 2020-08-16 DIAGNOSIS — G253 Myoclonus: Secondary | ICD-10-CM | POA: Diagnosis not present

## 2020-08-16 DIAGNOSIS — E871 Hypo-osmolality and hyponatremia: Secondary | ICD-10-CM | POA: Diagnosis present

## 2020-08-16 DIAGNOSIS — N1831 Chronic kidney disease, stage 3a: Secondary | ICD-10-CM | POA: Diagnosis not present

## 2020-08-16 DIAGNOSIS — J8410 Pulmonary fibrosis, unspecified: Secondary | ICD-10-CM | POA: Diagnosis not present

## 2020-08-16 DIAGNOSIS — E1122 Type 2 diabetes mellitus with diabetic chronic kidney disease: Secondary | ICD-10-CM | POA: Diagnosis present

## 2020-08-16 DIAGNOSIS — R0789 Other chest pain: Secondary | ICD-10-CM | POA: Diagnosis not present

## 2020-08-16 DIAGNOSIS — Z20822 Contact with and (suspected) exposure to covid-19: Secondary | ICD-10-CM | POA: Diagnosis present

## 2020-08-16 DIAGNOSIS — G8929 Other chronic pain: Secondary | ICD-10-CM | POA: Diagnosis present

## 2020-08-16 DIAGNOSIS — I77810 Thoracic aortic ectasia: Secondary | ICD-10-CM | POA: Diagnosis present

## 2020-08-16 DIAGNOSIS — E876 Hypokalemia: Secondary | ICD-10-CM | POA: Diagnosis not present

## 2020-08-16 DIAGNOSIS — D696 Thrombocytopenia, unspecified: Secondary | ICD-10-CM | POA: Diagnosis present

## 2020-08-16 DIAGNOSIS — D469 Myelodysplastic syndrome, unspecified: Secondary | ICD-10-CM | POA: Diagnosis present

## 2020-08-16 DIAGNOSIS — I2510 Atherosclerotic heart disease of native coronary artery without angina pectoris: Secondary | ICD-10-CM | POA: Diagnosis present

## 2020-08-16 DIAGNOSIS — E1140 Type 2 diabetes mellitus with diabetic neuropathy, unspecified: Secondary | ICD-10-CM | POA: Diagnosis present

## 2020-08-16 DIAGNOSIS — R079 Chest pain, unspecified: Secondary | ICD-10-CM | POA: Diagnosis present

## 2020-08-16 DIAGNOSIS — R609 Edema, unspecified: Secondary | ICD-10-CM | POA: Diagnosis not present

## 2020-08-16 DIAGNOSIS — N179 Acute kidney failure, unspecified: Secondary | ICD-10-CM | POA: Diagnosis not present

## 2020-08-16 DIAGNOSIS — D72829 Elevated white blood cell count, unspecified: Secondary | ICD-10-CM | POA: Diagnosis not present

## 2020-08-16 DIAGNOSIS — M549 Dorsalgia, unspecified: Secondary | ICD-10-CM | POA: Diagnosis present

## 2020-08-16 LAB — CBC WITH DIFFERENTIAL/PLATELET
Abs Immature Granulocytes: 36.99 10*3/uL — ABNORMAL HIGH (ref 0.00–0.07)
Basophils Absolute: 0.5 10*3/uL — ABNORMAL HIGH (ref 0.0–0.1)
Basophils Relative: 0 %
Eosinophils Absolute: 1.4 10*3/uL — ABNORMAL HIGH (ref 0.0–0.5)
Eosinophils Relative: 1 %
HCT: 21.4 % — ABNORMAL LOW (ref 39.0–52.0)
Hemoglobin: 7.4 g/dL — ABNORMAL LOW (ref 13.0–17.0)
Immature Granulocytes: 29 %
Lymphocytes Relative: 8 %
Lymphs Abs: 10.2 10*3/uL — ABNORMAL HIGH (ref 0.7–4.0)
MCH: 28.6 pg (ref 26.0–34.0)
MCHC: 34.6 g/dL (ref 30.0–36.0)
MCV: 82.6 fL (ref 80.0–100.0)
Monocytes Absolute: 14.3 10*3/uL — ABNORMAL HIGH (ref 0.1–1.0)
Monocytes Relative: 11 %
Neutro Abs: 66.1 10*3/uL — ABNORMAL HIGH (ref 1.7–7.7)
Neutrophils Relative %: 51 %
Platelets: 97 10*3/uL — ABNORMAL LOW (ref 150–400)
RBC: 2.59 MIL/uL — ABNORMAL LOW (ref 4.22–5.81)
RDW: 21.6 % — ABNORMAL HIGH (ref 11.5–15.5)
WBC: 129.4 10*3/uL (ref 4.0–10.5)
nRBC: 0.2 % (ref 0.0–0.2)

## 2020-08-16 LAB — BASIC METABOLIC PANEL
Anion gap: 5 (ref 5–15)
BUN: 43 mg/dL — ABNORMAL HIGH (ref 8–23)
CO2: 23 mmol/L (ref 22–32)
Calcium: 8.2 mg/dL — ABNORMAL LOW (ref 8.9–10.3)
Chloride: 102 mmol/L (ref 98–111)
Creatinine, Ser: 1.52 mg/dL — ABNORMAL HIGH (ref 0.61–1.24)
GFR, Estimated: 47 mL/min — ABNORMAL LOW (ref >60)
Glucose, Bld: 122 mg/dL — ABNORMAL HIGH (ref 70–99)
Potassium: 5.1 mmol/L (ref 3.5–5.1)
Sodium: 130 mmol/L — ABNORMAL LOW (ref 135–145)

## 2020-08-16 MED ORDER — MORPHINE SULFATE (PF) 2 MG/ML IV SOLN
2.0000 mg | INTRAVENOUS | Status: DC | PRN
  Administered 2020-08-17 – 2020-08-21 (×11): 2 mg via INTRAVENOUS
  Filled 2020-08-16 (×11): qty 1

## 2020-08-16 NOTE — Progress Notes (Signed)
PROGRESS NOTE    Paul [name]  ZOX:[id number] DOB: [birth year] DOA: 08/28/2020 PCP: Denita Lung, MD     Brief Narrative:  Paul [name] is a 80 y.o. male with medical history significant for CAD, pulmonary fibrosis, rheumatoid arthritis, CKD 3a, hypertension, infrarenal AAA, dilation of ascending thoracic aorta, MDS/MPN, now presenting to emergency department for evaluation of chest pain.  Patient was admitted to the hospital 1 week ago with chest pain, evaluated by cardiology with low suspicion for cardiac etiology.  He has continued to have frequent episodes of chest pain throughout the day and night since his discharge on 08/09/2020, seems to be worse when he lays down, involving the upper chest and shoulders, improved with nitroglycerin and sitting upright.  He has been sleeping in a recliner for the past few days due to the symptoms.  He denies any recent change in his exertional dyspnea and denies any fevers or chills.  He denies headache, change in vision, or focal numbness or weakness. Bilateral lower extremity venous Dopplers were negative for DVT.  CTA chest with no change in bilateral pleural effusions and pulmonary fibrosis. High-sensitivity troponin was normal x3.  Cardiology and oncology were consulted.  Cardiology did not feel that patient's chest pain was cardiac in etiology.  New events last 24 hours / Subjective: Patient states that there is no way he can go home with the amount of pain he is in.  Chest pain has been waxing and waning.  Currently chest pain-free on my examination.  He has been requiring IV morphine x2 and oxycodone x2 since midnight.  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   Type 2 diabetes mellitus with diabetic neuropathy, without long-term current use of  insulin ([name])   MDS/MPN (myelodysplastic/myeloproliferative neoplasms) ([name])   Chronic low back pain without sciatica   Rheumatoid arthritis ([place])   Pulmonary fibrosis ([name])   Chronic kidney disease, stage 3a ([name])   Thrombocytopenia ([name])   Recurrent chest pain -Troponin negative -CTA chest negative for PE -Cardiology signed off 7/15, did not feel that this was cardiac in nature, recommended hematology/oncology to evaluate for leukostasis  CAD -Continue aspirin, Plavix, zocor, lopressor, Imdur  Chronic diastolic heart failure -Does not appear volume overloaded, does have some chronic venous insufficiency  MDS -Oncology following, peripheral smear read by pathology as marked neutrophilia suspicious for CML -Dr. Burr Medico following, patient may need bone marrow biopsy  Pulmonary fibrosis -Remains stable at this time  Rheumatoid arthritis -Continue leflunomide  CKD stage IIIa -Baseline creatinine around 1.2-1.7  -Stable  Hypertension -Continue Norvasc, Lopressor   DVT prophylaxis:  enoxaparin (LOVENOX) injection 40 mg Start: 08/22/2020 2145  Code Status:     Code Status Orders  (From admission, onward)           Start     Ordered   08/24/2020 2136  Full code  Continuous        08/06/2020 2138           Code Status History     Date Active Date Inactive Code Status Order ID Comments User Context   08/07/2020 1710 08/09/2020 2035 Full Code [id number]  Orma Flaming, MD ED   12/01/2019 2325 12/06/2019 1940 Full Code [id number]  Shela Leff, MD ED   09/02/2018 0452 09/03/2018 2059 Full Code  [id number]  Elwyn Reach, MD ED   12/04/2015 1630 12/08/2015 1941 Full Code [id number]  Samella Parr, NP Inpatient   04/18/2015 0819 04/22/2015 2338 Full Code [id number]  Robbie Lis, MD Inpatient      Advance Directive Documentation    Flowsheet Row Most Recent Value  Type of Advance Directive Healthcare Power of Attorney, Living will  Pre-existing out of facility DNR  order (yellow form or pink MOST form) --  "MOST" Form in Place? --      Family Communication: Wife and sister at bedside Disposition Plan:  Status is: Observation  The patient will require care spanning > 2 midnights and should be moved to inpatient because: Ongoing active pain requiring inpatient pain management  Dispo: The patient is from: Home              Anticipated d/c is to: Home              Patient currently is not medically stable to d/c.    Difficult to place patient No      Consultants:  Cardiology Oncology   Antimicrobials:  Anti-infectives (From admission, onward)    None        Objective: Vitals:   08/15/20 2044 08/15/20 2248 08/16/20 0455 08/16/20 0813  BP: (!) 132/42 (!) 154/52 (!) 138/46 (!) 131/50  Pulse: 62 68 79 71  Resp: $Remo'18  18 18  'wqXVB$ Temp:   (!) 97.5 F (36.4 C)   TempSrc:   Oral   SpO2: 100%  99% 95%  Weight:   75.4 kg   Height:        Intake/Output Summary (Last 24 hours) at 08/16/2020 1053 Last data filed at 08/15/2020 1900 Gross per 24 hour  Intake --  Output 550 ml  Net -550 ml    Filed Weights   08/20/2020 1251 08/15/20 0300 08/16/20 0455  Weight: 72.6 kg 73.7 kg 75.4 kg   Examination: General exam: Appears calm and comfortable  Respiratory system: Clear to auscultation. Respiratory effort normal.  On room air Cardiovascular system: S1 & S2 heard, RRR. No pedal edema. Gastrointestinal system: Abdomen is nondistended, soft and nontender. Normal bowel sounds heard. Central nervous system: Alert and oriented. Non focal exam. Speech clear  Extremities: Symmetric in appearance bilaterally  Skin: No rashes, lesions or ulcers on exposed skin  Psychiatry: Judgement and insight appear stable. Mood & affect appropriate.    Data Reviewed: I have personally reviewed following labs and imaging studies  CBC: Recent Labs  Lab 08/30/2020 1257 08/15/20 0241 08/16/20 0259  WBC 131.5* 127.9* 129.4*  NEUTROABS  --  73.4* 66.1*  HGB 8.0*  7.8* 7.4*  HCT 24.0* 22.9* 21.4*  MCV 83.9 83.0 82.6  PLT 106* 102* 97*    Basic Metabolic Panel: Recent Labs  Lab 08/27/2020 1257 08/15/20 0241 08/16/20 0259  NA 130* 129* 130*  K 4.7 4.8 5.1  CL 102 102 102  CO2 21* 22 23  GLUCOSE 131* 115* 122*  BUN 39* 38* 43*  CREATININE 1.47* 1.39* 1.52*  CALCIUM 8.5* 8.4* 8.2*    GFR: Estimated Creatinine Clearance: 41.4 mL/min (A) (by C-G formula based on SCr of 1.52 mg/dL (H)). Liver Function Tests: No results for input(s): AST, ALT, ALKPHOS, BILITOT, PROT, ALBUMIN in the last 168 hours. No results for input(s): LIPASE, AMYLASE in the last 168 hours. No results for input(s): AMMONIA in the last 168 hours. Coagulation Profile: No results for input(s): INR, PROTIME in the last 168 hours.  Cardiac Enzymes: No results for input(s): CKTOTAL, CKMB, CKMBINDEX, TROPONINI in the last 168 hours. BNP (last 3 results) No results for input(s): PROBNP in the last 8760 hours. HbA1C: No results for input(s): HGBA1C in the last 72 hours. CBG: Recent Labs  Lab 08/09/20 1149  GLUCAP 157*    Lipid Profile: No results for input(s): CHOL, HDL, LDLCALC, TRIG, CHOLHDL, LDLDIRECT in the last 72 hours. Thyroid Function Tests: No results for input(s): TSH, T4TOTAL, FREET4, T3FREE, THYROIDAB in the last 72 hours. Anemia Panel: No results for input(s): VITAMINB12, FOLATE, FERRITIN, TIBC, IRON, RETICCTPCT in the last 72 hours. Sepsis Labs: No results for input(s): PROCALCITON, LATICACIDVEN in the last 168 hours.  Recent Results (from the past 240 hour(s))  Resp Panel by RT-PCR (Flu A&B, Covid) Nasopharyngeal Swab     Status: None   Collection Time: 08/07/20  1:01 PM   Specimen: Nasopharyngeal Swab; Nasopharyngeal(NP) swabs in vial transport medium  Result Value Ref Range Status   SARS Coronavirus 2 by RT PCR NEGATIVE NEGATIVE Final    Comment: (NOTE) SARS-CoV-2 target nucleic acids are NOT DETECTED.  The SARS-CoV-2 RNA is generally detectable in  upper respiratory specimens during the acute phase of infection. The lowest concentration of SARS-CoV-2 viral copies this assay can detect is 138 copies/mL. A negative result does not preclude SARS-Cov-2 infection and should not be used as the sole basis for treatment or other patient management decisions. A negative result may occur with  improper specimen collection/handling, submission of specimen other than nasopharyngeal swab, presence of viral mutation(s) within the areas targeted by this assay, and inadequate number of viral copies(<138 copies/mL). A negative result must be combined with clinical observations, patient history, and epidemiological information. The expected result is Negative.  Fact Sheet for Patients:  EntrepreneurPulse.com.au  Fact Sheet for Healthcare Providers:  IncredibleEmployment.be  This test is no t yet approved or cleared by the [place] FDA and  has been authorized for detection and/or diagnosis of SARS-CoV-2 by FDA under an Emergency Use Authorization (EUA). This EUA will remain  in effect (meaning this test can be used) for the duration of the COVID-19 declaration under Section 564(b)(1) of the Act, 21 U.S.C.section 360bbb-3(b)(1), unless the authorization is terminated  or revoked sooner.       Influenza A by PCR NEGATIVE NEGATIVE Final   Influenza B by PCR NEGATIVE NEGATIVE Final    Comment: (NOTE) The Xpert Xpress SARS-CoV-2/FLU/RSV plus assay is intended as an aid in the diagnosis of influenza from Nasopharyngeal swab specimens and should not be used as a sole basis for treatment. Nasal washings and aspirates are unacceptable for Xpert Xpress SARS-CoV-2/FLU/RSV testing.  Fact Sheet for Patients: EntrepreneurPulse.com.au  Fact Sheet for Healthcare Providers: IncredibleEmployment.be  This test is not yet approved or cleared by the [place] FDA and has been  authorized for detection and/or diagnosis of SARS-CoV-2 by FDA under an Emergency Use Authorization (EUA). This EUA will remain in effect (meaning this test can be used) for the duration of the COVID-19 declaration under Section 564(b)(1) of the Act, 21 U.S.C. section 360bbb-3(b)(1), unless the authorization is terminated or revoked.  Performed at [facility], [place] [address] [place], [place] [address]   SARS CORONAVIRUS 2 (TAT 6-24 HRS) Nasopharyngeal Nasopharyngeal Swab     Status: None   Collection Time: 08/12/2020  8:35 PM   Specimen: Nasopharyngeal Swab  Result Value Ref Range Status   SARS Coronavirus 2 NEGATIVE NEGATIVE Final    Comment: (NOTE)  SARS-CoV-2 target nucleic acids are NOT DETECTED.  The SARS-CoV-2 RNA is generally detectable in upper and lower respiratory specimens during the acute phase of infection. Negative results do not preclude SARS-CoV-2 infection, do not rule out co-infections with other pathogens, and should not be used as the sole basis for treatment or other patient management decisions. Negative results must be combined with clinical observations, patient history, and epidemiological information. The expected result is Negative.  Fact Sheet for Patients: SugarRoll.be  Fact Sheet for Healthcare Providers: https://www.woods-mathews.com/  This test is not yet approved or cleared by the [place] FDA and  has been authorized for detection and/or diagnosis of SARS-CoV-2 by FDA under an Emergency Use Authorization (EUA). This EUA will remain  in effect (meaning this test can be used) for the duration of the COVID-19 declaration under Se ction 564(b)(1) of the Act, 21 U.S.C. section 360bbb-3(b)(1), unless the authorization is terminated or revoked sooner.  Performed at [facility], [place] [address], [place], [place] [address]        Radiology Studies: CT Angio Chest PE W and/or Wo  Contrast  Result Date: 08/20/2020 CLINICAL DATA:  Chest pain.  Shortness of breath. EXAM: CT ANGIOGRAPHY CHEST WITH CONTRAST TECHNIQUE: Multidetector CT imaging of the chest was performed using the standard protocol during bolus administration of intravenous contrast. Multiplanar CT image reconstructions and MIPs were obtained to evaluate the vascular anatomy. CONTRAST:  75mL OMNIPAQUE IOHEXOL 350 MG/ML SOLN COMPARISON:  Plain film of earlier today.  CT 07/14/2020. FINDINGS: Cardiovascular: The quality of this exam for evaluation of pulmonary embolism is good. No evidence of pulmonary embolism. Pulmonary artery enlargement, outflow tract 3.1 cm Aorta not well opacified. Aortic atherosclerosis. Tortuous thoracic aorta. Mild cardiomegaly. Three vessel coronary artery calcification. Mediastinum/Nodes: Prominent mediastinal nodes are again identified, favored to be reactive. Example at up to 1.1 cm in the high right paratracheal station. Lungs/Pleura: Small left greater than right pleural effusions are unchanged. Bilateral calcified pleural plaques. Right greater than left apical pleuroparenchymal scarring. 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 Basilar and peripheral predominant subpleural reticulation with scattered basilar honeycombing. Right lower lobe calcified granuloma. Right middle lobe 5 mm pulmonary nodule is similar on 107/6. Upper Abdomen: Old granulomatous disease in the liver and spleen. Normal imaged portions of the stomach, gallbladder, pancreas, adrenal glands, kidneys. Abdominal aortic atherosclerosis. Musculoskeletal: Lower thoracic spondylosis with mild wedging of the T12 vertebral body. Review of the MIP images confirms the above findings. IMPRESSION: 1.  No evidence of pulmonary embolism. 2. No significant change compared to the CT of 07/14/2020. 3. Interstitial lung disease, consistent with asbestosis and asbestos related pleural disease. 4. Small  bilateral pleural effusions. 5. Coronary artery atherosclerosis. Aortic Atherosclerosis (ICD10-I70.0). 6. Pulmonary artery enlargement suggests pulmonary arterial hypertension. Electronically Signed   By: Abigail Miyamoto M.D.   On: 08/13/2020 19:18   DG Chest Port 1 View  Result Date: 08/20/2020 CLINICAL DATA:  Chest pain and shortness of breath. Pulmonary fibrosis. EXAM: PORTABLE CHEST 1 VIEW COMPARISON:  08/07/2020 FINDINGS: Stable cardiomediastinal contours. Persistent bilateral pleural effusions identified. Chronic reticular interstitial opacities with a lower lung zone predominance is again seen compatible with diagnosis of pulmonary fibrosis. No superimposed airspace consolidation. IMPRESSION: No change in bilateral pleural effusions and pulmonary fibrosis. Electronically Signed   By: Kerby Moors M.D.   On: 08/26/2020 13:30   VAS [place] LOWER EXTREMITY VENOUS (DVT) (ONLY [facility] & [facility])  Result Date: 08/25/2020  Lower Venous DVT  Study Patient Name:  JAE SKEET  Date of Exam:   08/01/2020 Medical Rec #: [id number]           Accession #:    [id number] Date of Birth: 01/19/1941            Patient Gender: M Patient Age:   80Y Exam Location:  [facility] Procedure:      VAS [place] LOWER EXTREMITY VENOUS (DVT) Referring Phys: [id number] JULIE HAVILAND --------------------------------------------------------------------------------  Indications: Swelling, and SOB.  Comparison Study: No prior study Performing Technologist: Maudry Mayhew MHA, RDMS, RVT, RDCS  Examination Guidelines: A complete evaluation includes B-mode imaging, spectral Doppler, color Doppler, and power Doppler as needed of all accessible portions of each vessel. Bilateral testing is considered an integral part of a complete examination. Limited examinations for reoccurring indications may be performed as noted. The reflux portion of the exam is performed with the patient in reverse Trendelenburg.   +---------+---------------+---------+-----------+----------+--------------+ RIGHT    CompressibilityPhasicitySpontaneityPropertiesThrombus Aging +---------+---------------+---------+-----------+----------+--------------+ CFV      Full           Yes      Yes                                 +---------+---------------+---------+-----------+----------+--------------+ SFJ      Full                                                        +---------+---------------+---------+-----------+----------+--------------+ FV Prox  Full                                                        +---------+---------------+---------+-----------+----------+--------------+ FV Mid   Full                                                        +---------+---------------+---------+-----------+----------+--------------+ FV DistalFull                                                        +---------+---------------+---------+-----------+----------+--------------+ PFV      Full                                                        +---------+---------------+---------+-----------+----------+--------------+ POP      Full           Yes      Yes                                 +---------+---------------+---------+-----------+----------+--------------+ PTV      Full                                                        +---------+---------------+---------+-----------+----------+--------------+  PERO     Full                                                        +---------+---------------+---------+-----------+----------+--------------+   +---------+---------------+---------+-----------+----------+--------------+ LEFT     CompressibilityPhasicitySpontaneityPropertiesThrombus Aging +---------+---------------+---------+-----------+----------+--------------+ CFV      Full           Yes      Yes                                  +---------+---------------+---------+-----------+----------+--------------+ SFJ      Full                                                        +---------+---------------+---------+-----------+----------+--------------+ FV Prox  Full                                                        +---------+---------------+---------+-----------+----------+--------------+ FV Mid   Full                                                        +---------+---------------+---------+-----------+----------+--------------+ FV DistalFull                                                        +---------+---------------+---------+-----------+----------+--------------+ PFV      Full                                                        +---------+---------------+---------+-----------+----------+--------------+ POP      Full           Yes      Yes                                 +---------+---------------+---------+-----------+----------+--------------+ PTV      Full                                                        +---------+---------------+---------+-----------+----------+--------------+ PERO     Full                                                        +---------+---------------+---------+-----------+----------+--------------+  Summary: BILATERAL: - No evidence of deep vein thrombosis seen in the lower extremities, bilaterally. -No evidence of popliteal cyst, bilaterally.   *See table(s) above for measurements and observations. Electronically signed by Jamelle Haring on 08/01/2020 at 5:13:17 PM.    Final       Scheduled Meds:  amLODipine  5 mg Oral Daily   aspirin  81 mg Oral Daily   clopidogrel  75 mg Oral Daily   enoxaparin (LOVENOX) injection  40 mg Subcutaneous Q24H   febuxostat  40 mg Oral Daily   fluticasone furoate-vilanterol  1 puff Inhalation Daily   isosorbide mononitrate  60 mg Oral QHS   leflunomide  20 mg Oral Daily   metoprolol tartrate  25 mg  Oral BID   simvastatin  20 mg Oral QHS   umeclidinium bromide  1 puff Inhalation Daily   Continuous Infusions:   LOS: 0 days      Time spent: 25 minutes   Dessa Phi, DO [facility] 08/16/2020, 10:53 AM   Available via Epic secure chat 7am-7pm After these hours, please refer to coverage provider listed on [email or web address]

## 2020-08-17 DIAGNOSIS — R0789 Other chest pain: Secondary | ICD-10-CM | POA: Diagnosis not present

## 2020-08-17 LAB — CBC WITH DIFFERENTIAL/PLATELET
Abs Immature Granulocytes: 39.25 10*3/uL — ABNORMAL HIGH (ref 0.00–0.07)
Basophils Absolute: 0.4 10*3/uL — ABNORMAL HIGH (ref 0.0–0.1)
Basophils Relative: 0 %
Eosinophils Absolute: 1.2 10*3/uL — ABNORMAL HIGH (ref 0.0–0.5)
Eosinophils Relative: 1 %
HCT: 21.6 % — ABNORMAL LOW (ref 39.0–52.0)
Hemoglobin: 7.4 g/dL — ABNORMAL LOW (ref 13.0–17.0)
Immature Granulocytes: 30 %
Lymphocytes Relative: 5 %
Lymphs Abs: 5.8 10*3/uL — ABNORMAL HIGH (ref 0.7–4.0)
MCH: 28.6 pg (ref 26.0–34.0)
MCHC: 34.3 g/dL (ref 30.0–36.0)
MCV: 83.4 fL (ref 80.0–100.0)
Monocytes Absolute: 19 10*3/uL — ABNORMAL HIGH (ref 0.1–1.0)
Monocytes Relative: 15 %
Neutro Abs: 64.2 10*3/uL — ABNORMAL HIGH (ref 1.7–7.7)
Neutrophils Relative %: 49 %
Platelets: 98 10*3/uL — ABNORMAL LOW (ref 150–400)
RBC: 2.59 MIL/uL — ABNORMAL LOW (ref 4.22–5.81)
RDW: 21.6 % — ABNORMAL HIGH (ref 11.5–15.5)
WBC: 130 10*3/uL (ref 4.0–10.5)
nRBC: 0.3 % — ABNORMAL HIGH (ref 0.0–0.2)

## 2020-08-17 LAB — BASIC METABOLIC PANEL
Anion gap: 3 — ABNORMAL LOW (ref 5–15)
BUN: 45 mg/dL — ABNORMAL HIGH (ref 8–23)
CO2: 23 mmol/L (ref 22–32)
Calcium: 8.2 mg/dL — ABNORMAL LOW (ref 8.9–10.3)
Chloride: 100 mmol/L (ref 98–111)
Creatinine, Ser: 1.68 mg/dL — ABNORMAL HIGH (ref 0.61–1.24)
GFR, Estimated: 41 mL/min — ABNORMAL LOW (ref >60)
Glucose, Bld: 97 mg/dL (ref 70–99)
Potassium: 4.8 mmol/L (ref 3.5–5.1)
Sodium: 126 mmol/L — ABNORMAL LOW (ref 135–145)

## 2020-08-17 NOTE — Progress Notes (Signed)
PT Cancellation Note  Patient Details Name: Paul [name] MRN: [id number] DOB: 1940/03/08   Cancelled Treatment:    Reason Eval/Treat Not Completed: Other (comment) Pt sitting up on edge of bed upon arrival; endorsing chest pain and difficulty breathing (SpO2 98%, RN aware). Pt requesting to lie back down and refusing out of bed mobility. Agreeable to participate tomorrow).  Wyona Almas, PT, DPT Acute Rehabilitation Services Pager [phone] Office [phone]    Deno Etienne 08/17/2020, 2:22 PM

## 2020-08-17 NOTE — Progress Notes (Signed)
PROGRESS NOTE    Paul [name]  EPP:[id number] DOB: [birth year] DOA: 08/24/2020 PCP: Paul Lung, MD     Brief Narrative:  Paul [name] is a 80 y.o. male with medical history significant for CAD, pulmonary fibrosis, rheumatoid arthritis, CKD 3a, hypertension, infrarenal AAA, dilation of ascending thoracic aorta, MDS/MPN, now presenting to emergency department for evaluation of chest pain.  Patient was admitted to the hospital 1 week ago with chest pain, evaluated by cardiology with low suspicion for cardiac etiology.  He has continued to have frequent episodes of chest pain throughout the day and night since his discharge on 08/09/2020, seems to be worse when he lays down, involving the upper chest and shoulders, improved with nitroglycerin and sitting upright.  He has been sleeping in a recliner for the past few days due to the symptoms.  He denies any recent change in his exertional dyspnea and denies any fevers or chills.  He denies headache, change in vision, or focal numbness or weakness. Bilateral lower extremity venous Dopplers were negative for DVT.  CTA chest with no change in bilateral pleural effusions and pulmonary fibrosis. High-sensitivity troponin was normal x3.  Cardiology and oncology were consulted.  Cardiology did not feel that patient's chest pain was cardiac in etiology.  New events last 24 hours / Subjective: Patient states that pain was a little bit better, requiring less medication.  Sister at bedside states that the oxycodone has been making him loopy.  Patient has been sleeping most of the day yesterday.  Encouraged him to get out of bed today and work with physical therapy.  Assessment & Plan:   Principal Problem:   Chest pain Active Problems:   CAD (coronary artery disease)   Hypertension complicating diabetes ([place])   Anemia of chronic disease   AAA (abdominal aortic aneurysm) ([name])-infrarenal 3.4 cm   Type 2 diabetes mellitus with diabetic neuropathy,  without long-term current use of insulin ([name])   MDS/MPN (myelodysplastic/myeloproliferative neoplasms) ([name])   Chronic low back pain without sciatica   Rheumatoid arthritis ([place])   Pulmonary fibrosis ([name])   Chronic kidney disease, stage 3a ([name])   Thrombocytopenia ([name])   Recurrent chest pain -Troponin negative -CTA chest negative for PE -Cardiology signed off 7/15, did not feel that this was cardiac in nature, recommended hematology/oncology to evaluate for leukostasis  CAD -Continue aspirin, Plavix, zocor, lopressor, Imdur  Chronic diastolic heart failure -Does not appear volume overloaded, does have some chronic venous insufficiency  MDS -Oncology following, peripheral smear read by pathology as marked neutrophilia suspicious for CML -Dr. Burr Medico following, patient may need bone marrow biopsy  Pulmonary fibrosis -Remains stable at this time  Rheumatoid arthritis -Continue leflunomide  CKD stage IIIa -Baseline creatinine around 1.2-1.7  -Stable  Hypertension -Continue Norvasc, Lopressor   DVT prophylaxis:  enoxaparin (LOVENOX) injection 40 mg Start: 08/11/2020 2145  Code Status:     Code Status Orders  (From admission, onward)           Start     Ordered   08/09/2020 2136  Full code  Continuous        08/24/2020 2138           Code Status History     Date Active Date Inactive Code Status Order ID Comments User Context   08/07/2020 1710 08/09/2020 2035 Full Code [id number]  Paul Flaming, MD ED   12/01/2019 2325 12/06/2019 1940 Full Code [id number]  Paul Leff, MD ED   09/02/2018 [phone] 09/03/2018 2059  Full Code [id number]  Paul Reach, MD ED   12/04/2015 1630 12/08/2015 1941 Full Code [id number]  Paul Parr, NP Inpatient   04/18/2015 1941 04/22/2015 2338 Full Code [id number]  Paul Lis, MD Inpatient      Advance Directive Documentation    Flowsheet Row Most Recent Value  Type of Advance Directive Healthcare Power of Attorney, Living will   Pre-existing out of facility DNR order (yellow form or pink MOST form) --  "MOST" Form in Place? --      Family Communication: Wife and sister at bedside Disposition Plan:  Status is: Inpatient  Remains inpatient appropriate because:IV treatments appropriate due to intensity of illness or inability to take PO  Dispo: The patient is from: Home              Anticipated d/c is to: Home              Patient currently is not medically stable to d/c.   Difficult to place patient No          Consultants:  Cardiology Oncology   Antimicrobials:  Anti-infectives (From admission, onward)    None        Objective: Vitals:   08/16/20 0813 08/16/20 1636 08/16/20 2031 08/17/20 0455  BP: (!) 131/50 (!) 119/50 (!) 128/51 (!) 128/45  Pulse: 71 (!) 55 (!) 53 65  Resp: _0 Temp:  97.6 F (36.4 C) 97.6 F (36.4 C) 97.6 F (36.4 C)  TempSrc:  Oral Oral Oral  SpO2: 95% 95% 95% 100%  Weight:    74.9 kg  Height:        Intake/Output Summary (Last 24 hours) at 08/17/2020 1233 Last data filed at 08/16/2020 2300 Gross per 24 hour  Intake 120 ml  Output 375 ml  Net -255 ml    Filed Weights   08/15/20 0300 08/16/20 0455 08/17/20 0455  Weight: 73.7 kg 75.4 kg 74.9 kg   Examination: General exam: Appears calm and comfortable  Respiratory system: Clear to auscultation. Respiratory effort normal. Cardiovascular system: S1 & S2 heard, RRR. No pedal edema. Gastrointestinal system: Abdomen is nondistended, soft and nontender. Normal bowel sounds heard. Central nervous system: Alert and oriented. Non focal exam. Speech clear  Extremities: Symmetric in appearance bilaterally  Skin: No rashes, lesions or ulcers on exposed skin  Psychiatry: Judgement and insight appear stable.   Data Reviewed: I have personally reviewed following labs and imaging studies  CBC: Recent Labs  Lab 08/02/2020 1257 08/15/20 0241 08/16/20 0259 08/17/20 0124  WBC 131.5* 127.9* 129.4* 130.0*   NEUTROABS  --  73.4* 66.1* 64.2*  HGB 8.0* 7.8* 7.4* 7.4*  HCT 24.0* 22.9* 21.4* 21.6*  MCV 83.9 83.0 82.6 83.4  PLT 106* 102* 97* 98*    Basic Metabolic Panel: Recent Labs  Lab 08/10/2020 1257 08/15/20 0241 08/16/20 0259 08/17/20 0124  NA 130* 129* 130* 126*  K 4.7 4.8 5.1 4.8  CL 102 102 102 100  CO2 21* _1 GLUCOSE 131* 115* 122* 97  BUN 39* 38* 43* 45*  CREATININE 1.47* 1.39* 1.52* 1.68*  CALCIUM 8.5* 8.4* 8.2* 8.2*    GFR: Estimated Creatinine Clearance: 37.4 mL/min (A) (by C-G formula based on SCr of 1.68 mg/dL (H)). Liver Function Tests: No results for input(s): AST, ALT, ALKPHOS, BILITOT, PROT, ALBUMIN in the last 168 hours. No results for input(s): LIPASE, AMYLASE in the last 168 hours. No results for input(s): AMMONIA in the last  168 hours. Coagulation Profile: No results for input(s): INR, PROTIME in the last 168 hours. Cardiac Enzymes: No results for input(s): CKTOTAL, CKMB, CKMBINDEX, TROPONINI in the last 168 hours. BNP (last 3 results) No results for input(s): PROBNP in the last 8760 hours. HbA1C: No results for input(s): HGBA1C in the last 72 hours. CBG: No results for input(s): GLUCAP in the last 168 hours.  Lipid Profile: No results for input(s): CHOL, HDL, LDLCALC, TRIG, CHOLHDL, LDLDIRECT in the last 72 hours. Thyroid Function Tests: No results for input(s): TSH, T4TOTAL, FREET4, T3FREE, THYROIDAB in the last 72 hours. Anemia Panel: No results for input(s): VITAMINB12, FOLATE, FERRITIN, TIBC, IRON, RETICCTPCT in the last 72 hours. Sepsis Labs: No results for input(s): PROCALCITON, LATICACIDVEN in the last 168 hours.  Recent Results (from the past 240 hour(s))  Resp Panel by RT-PCR (Flu A&B, Covid) Nasopharyngeal Swab     Status: None   Collection Time: 08/07/20  1:01 PM   Specimen: Nasopharyngeal Swab; Nasopharyngeal(NP) swabs in vial transport medium  Result Value Ref Range Status   SARS Coronavirus 2 by RT PCR NEGATIVE NEGATIVE Final     Comment: (NOTE) SARS-CoV-2 target nucleic acids are NOT DETECTED.  The SARS-CoV-2 RNA is generally detectable in upper respiratory specimens during the acute phase of infection. The lowest concentration of SARS-CoV-2 viral copies this assay can detect is 138 copies/mL. A negative result does not preclude SARS-Cov-2 infection and should not be used as the sole basis for treatment or other patient management decisions. A negative result may occur with  improper specimen collection/handling, submission of specimen other than nasopharyngeal swab, presence of viral mutation(s) within the areas targeted by this assay, and inadequate number of viral copies(<138 copies/mL). A negative result must be combined with clinical observations, patient history, and epidemiological information. The expected result is Negative.  Fact Sheet for Patients:  EntrepreneurPulse.com.au  Fact Sheet for Healthcare Providers:  IncredibleEmployment.be  This test is no t yet approved or cleared by the [place] FDA and  has been authorized for detection and/or diagnosis of SARS-CoV-2 by FDA under an Emergency Use Authorization (EUA). This EUA will remain  in effect (meaning this test can be used) for the duration of the COVID-19 declaration under Section 564(b)(1) of the Act, 21 U.S.C.section 360bbb-3(b)(1), unless the authorization is terminated  or revoked sooner.       Influenza A by PCR NEGATIVE NEGATIVE Final   Influenza B by PCR NEGATIVE NEGATIVE Final    Comment: (NOTE) The Xpert Xpress SARS-CoV-2/FLU/RSV plus assay is intended as an aid in the diagnosis of influenza from Nasopharyngeal swab specimens and should not be used as a sole basis for treatment. Nasal washings and aspirates are unacceptable for Xpert Xpress SARS-CoV-2/FLU/RSV testing.  Fact Sheet for Patients: EntrepreneurPulse.com.au  Fact Sheet for Healthcare  Providers: IncredibleEmployment.be  This test is not yet approved or cleared by the [place] FDA and has been authorized for detection and/or diagnosis of SARS-CoV-2 by FDA under an Emergency Use Authorization (EUA). This EUA will remain in effect (meaning this test can be used) for the duration of the COVID-19 declaration under Section 564(b)(1) of the Act, 21 U.S.C. section 360bbb-3(b)(1), unless the authorization is terminated or revoked.  Performed at [facility], [place] [address] [place], [place] [address]   SARS CORONAVIRUS 2 (TAT 6-24 HRS) Nasopharyngeal Nasopharyngeal Swab     Status: None   Collection Time: 08/04/2020  8:35 PM   Specimen: Nasopharyngeal Swab  Result Value Ref Range  Status   SARS Coronavirus 2 NEGATIVE NEGATIVE Final    Comment: (NOTE) SARS-CoV-2 target nucleic acids are NOT DETECTED.  The SARS-CoV-2 RNA is generally detectable in upper and lower respiratory specimens during the acute phase of infection. Negative results do not preclude SARS-CoV-2 infection, do not rule out co-infections with other pathogens, and should not be used as the sole basis for treatment or other patient management decisions. Negative results must be combined with clinical observations, patient history, and epidemiological information. The expected result is Negative.  Fact Sheet for Patients: SugarRoll.be  Fact Sheet for Healthcare Providers: https://www.woods-mathews.com/  This test is not yet approved or cleared by the [place] FDA and  has been authorized for detection and/or diagnosis of SARS-CoV-2 by FDA under an Emergency Use Authorization (EUA). This EUA will remain  in effect (meaning this test can be used) for the duration of the COVID-19 declaration under Se ction 564(b)(1) of the Act, 21 U.S.C. section 360bbb-3(b)(1), unless the authorization is terminated or revoked sooner.  Performed at  [facility], [place] [address], [place], [place] [address]        Radiology Studies: No results found.    Scheduled Meds:  amLODipine  5 mg Oral Daily   aspirin  81 mg Oral Daily   clopidogrel  75 mg Oral Daily   enoxaparin (LOVENOX) injection  40 mg Subcutaneous Q24H   febuxostat  40 mg Oral Daily   fluticasone furoate-vilanterol  1 puff Inhalation Daily   isosorbide mononitrate  60 mg Oral QHS   leflunomide  20 mg Oral Daily   metoprolol tartrate  25 mg Oral BID   simvastatin  20 mg Oral QHS   umeclidinium bromide  1 puff Inhalation Daily   Continuous Infusions:   LOS: 1 day      Time spent: 20 minutes   Dessa Phi, DO [facility] 08/17/2020, 12:33 PM   Available via Epic secure chat 7am-7pm After these hours, please refer to coverage provider listed on [email or web address]

## 2020-08-18 ENCOUNTER — Encounter (HOSPITAL_COMMUNITY): Payer: Self-pay | Admitting: Family Medicine

## 2020-08-18 ENCOUNTER — Institutional Professional Consult (permissible substitution): Payer: Medicare Other | Admitting: Pulmonary Disease

## 2020-08-18 DIAGNOSIS — D72829 Elevated white blood cell count, unspecified: Secondary | ICD-10-CM

## 2020-08-18 DIAGNOSIS — R0789 Other chest pain: Secondary | ICD-10-CM | POA: Diagnosis not present

## 2020-08-18 DIAGNOSIS — D469 Myelodysplastic syndrome, unspecified: Principal | ICD-10-CM

## 2020-08-18 LAB — CBC
HCT: 21.3 % — ABNORMAL LOW (ref 39.0–52.0)
Hemoglobin: 7.1 g/dL — ABNORMAL LOW (ref 13.0–17.0)
MCH: 28 pg (ref 26.0–34.0)
MCHC: 33.3 g/dL (ref 30.0–36.0)
MCV: 83.9 fL (ref 80.0–100.0)
Platelets: 107 10*3/uL — ABNORMAL LOW (ref 150–400)
RBC: 2.54 MIL/uL — ABNORMAL LOW (ref 4.22–5.81)
RDW: 21.6 % — ABNORMAL HIGH (ref 11.5–15.5)
WBC: 138.2 10*3/uL (ref 4.0–10.5)
nRBC: 0.6 % — ABNORMAL HIGH (ref 0.0–0.2)

## 2020-08-18 LAB — BASIC METABOLIC PANEL
Anion gap: 7 (ref 5–15)
BUN: 51 mg/dL — ABNORMAL HIGH (ref 8–23)
CO2: 23 mmol/L (ref 22–32)
Calcium: 8.4 mg/dL — ABNORMAL LOW (ref 8.9–10.3)
Chloride: 100 mmol/L (ref 98–111)
Creatinine, Ser: 1.69 mg/dL — ABNORMAL HIGH (ref 0.61–1.24)
GFR, Estimated: 41 mL/min — ABNORMAL LOW (ref >60)
Glucose, Bld: 68 mg/dL — ABNORMAL LOW (ref 70–99)
Potassium: 5.2 mmol/L — ABNORMAL HIGH (ref 3.5–5.1)
Sodium: 130 mmol/L — ABNORMAL LOW (ref 135–145)

## 2020-08-18 MED ORDER — OXYCODONE HCL 5 MG PO TABS
10.0000 mg | ORAL_TABLET | Freq: Three times a day (TID) | ORAL | Status: DC | PRN
  Administered 2020-08-20: 10 mg via ORAL
  Filled 2020-08-18: qty 2

## 2020-08-18 MED ORDER — PANTOPRAZOLE SODIUM 20 MG PO TBEC
20.0000 mg | DELAYED_RELEASE_TABLET | Freq: Two times a day (BID) | ORAL | Status: DC
  Administered 2020-08-18 – 2020-08-22 (×9): 20 mg via ORAL
  Filled 2020-08-18 (×9): qty 1

## 2020-08-18 NOTE — Progress Notes (Signed)
PROGRESS NOTE    Paul [name]  WPV:[id number] DOB: 18-Aug-1940 DOA: 08/21/2020 PCP: Denita Lung, MD     Brief Narrative:  Paul [name] is a 80 y.o. male with medical history significant for CAD, pulmonary fibrosis, rheumatoid arthritis, CKD 3a, hypertension, infrarenal AAA, dilation of ascending thoracic aorta, MDS/MPN, now presenting to emergency department for evaluation of chest pain.  Patient was admitted to the hospital 1 week ago with chest pain, evaluated by cardiology with low suspicion for cardiac etiology.  He has continued to have frequent episodes of chest pain throughout the day and night since his discharge on 08/09/2020, seems to be worse when he lays down, involving the upper chest and shoulders, improved with nitroglycerin and sitting upright.  He has been sleeping in a recliner for the past few days due to the symptoms.  He denies any recent change in his exertional dyspnea and denies any fevers or chills.  He denies headache, change in vision, or focal numbness or weakness. Bilateral lower extremity venous Dopplers were negative for DVT.  CTA chest with no change in bilateral pleural effusions and pulmonary fibrosis. High-sensitivity troponin was normal x3.  Cardiology and oncology were consulted.  Cardiology did not feel that patient's chest pain was cardiac in etiology.  New events last 24 hours / Subjective: Continues to have chest pains, limiting his mobility. Required IV morphine early this morning.   Assessment & Plan:   Principal Problem:   Chest pain Active Problems:   CAD (coronary artery disease)   Hypertension complicating diabetes ([name])   Anemia of chronic disease   AAA (abdominal aortic aneurysm) ([name])-infrarenal 3.4 cm   Type 2 diabetes mellitus with diabetic neuropathy, without long-term current use of insulin ([name])   MDS/MPN (myelodysplastic/myeloproliferative neoplasms) ([name])   Chronic low back pain without sciatica   Rheumatoid arthritis  ([name])   Pulmonary fibrosis ([name])   Chronic kidney disease, stage 3a ([name])   Thrombocytopenia ([name])   Recurrent chest pain -Troponin negative -CTA chest negative for PE -Cardiology signed off 7/15, did not feel that this was cardiac in nature, recommended hematology/oncology to evaluate for leukostasis  MDS -Oncology following, peripheral smear read by pathology as marked neutrophilia suspicious for CML -Dr. Burr Medico following, tentative bone marrow biopsy tmrw   CAD -Continue zocor, lopressor, Imdur -Hold aspirin and plavix pending bone marrow biopsy. His last dose was 7/18   Chronic diastolic heart failure -Does not appear volume overloaded, does have some chronic venous insufficiency  Pulmonary fibrosis -Remains stable at this time  Rheumatoid arthritis -Continue leflunomide  CKD stage IIIa -Baseline creatinine around 1.2-1.7  -Stable  Hypertension -Continue Norvasc, Lopressor  Mild hyperkalemia -Monitor    DVT prophylaxis:  enoxaparin (LOVENOX) injection 40 mg Start: 08/05/2020 2145  Code Status:     Code Status Orders  (From admission, onward)           Start     Ordered   08/07/2020 2136  Full code  Continuous        08/13/2020 2138           Code Status History     Date Active Date Inactive Code Status Order ID Comments User Context   08/07/2020 1710 08/09/2020 2035 Full Code [id number]  Orma Flaming, MD ED   12/01/2019 2325 12/06/2019 1940 Full Code [id number]  Shela Leff, MD ED   09/02/2018 0452 09/03/2018 2059 Full Code [id number]  Elwyn Reach, MD ED   12/04/2015 1630 12/08/2015 1941 Full Code  [id number]  Samella Parr, NP Inpatient   04/18/2015 0819 04/22/2015 2338 Full Code [id number]  Robbie Lis, MD Inpatient      Advance Directive Documentation    Flowsheet Row Most Recent Value  Type of Advance Directive Healthcare Power of Attorney, Living will  Pre-existing out of facility DNR order (yellow form or pink MOST form) --  "MOST" Form  in Place? --      Family Communication: Wife and sister at bedside Disposition Plan:  Status is: Inpatient  Remains inpatient appropriate because:IV treatments appropriate due to intensity of illness or inability to take PO  Dispo: The patient is from: Home              Anticipated d/c is to: Home              Patient currently is not medically stable to d/c.   Difficult to place patient No          Consultants:  Cardiology Oncology   Antimicrobials:  Anti-infectives (From admission, onward)    None        Objective: Vitals:   08/17/20 1342 08/17/20 1954 08/18/20 0451 08/18/20 0822  BP: (!) 145/62 (!) 109/47 (!) 120/48   Pulse: 72 68 67   Resp:  18 20   Temp: 97.8 F (36.6 C) (!) 97.3 F (36.3 C)    TempSrc: Oral Oral Oral   SpO2: 99% 96% 100% 100%  Weight:   74.8 kg   Height:        Intake/Output Summary (Last 24 hours) at 08/18/2020 1040 Last data filed at 08/18/2020 0500 Gross per 24 hour  Intake 120 ml  Output 550 ml  Net -430 ml    Filed Weights   08/16/20 0455 08/17/20 0455 08/18/20 0451  Weight: 75.4 kg 74.9 kg 74.8 kg    Examination: General exam: Appears calm and comfortable, weak in appearance  Respiratory system: Clear to auscultation. Respiratory effort normal. Cardiovascular system: S1 & S2 heard, RRR. No pedal edema. Gastrointestinal system: Abdomen is nondistended, soft and nontender. Normal bowel sounds heard. Central nervous system: Alert and oriented. Non focal exam. Speech clear  Extremities: Symmetric in appearance bilaterally  Skin: No rashes, lesions or ulcers on exposed skin  Psychiatry: Judgement and insight appear stable. Mood & affect appropriate.   Data Reviewed: I have personally reviewed following labs and imaging studies  CBC: Recent Labs  Lab 08/13/2020 1257 08/15/20 0241 08/16/20 0259 08/17/20 0124 08/18/20 0100  WBC 131.5* 127.9* 129.4* 130.0* 138.2*  NEUTROABS  --  73.4* 66.1* 64.2*  --   HGB 8.0*  7.8* 7.4* 7.4* 7.1*  HCT 24.0* 22.9* 21.4* 21.6* 21.3*  MCV 83.9 83.0 82.6 83.4 83.9  PLT 106* 102* 97* 98* 107*    Basic Metabolic Panel: Recent Labs  Lab 08/11/2020 1257 08/15/20 0241 08/16/20 0259 08/17/20 0124 08/18/20 0100  NA 130* 129* 130* 126* 130*  K 4.7 4.8 5.1 4.8 5.2*  CL 102 102 102 100 100  CO2 21* _0 GLUCOSE 131* 115* 122* 97 68*  BUN 39* 38* 43* 45* 51*  CREATININE 1.47* 1.39* 1.52* 1.68* 1.69*  CALCIUM 8.5* 8.4* 8.2* 8.2* 8.4*    GFR: Estimated Creatinine Clearance: 37.2 mL/min (A) (by C-G formula based on SCr of 1.69 mg/dL (H)). Liver Function Tests: No results for input(s): AST, ALT, ALKPHOS, BILITOT, PROT, ALBUMIN in the last 168 hours. No results for input(s): LIPASE, AMYLASE in the last 168 hours. No results  for input(s): AMMONIA in the last 168 hours. Coagulation Profile: No results for input(s): INR, PROTIME in the last 168 hours. Cardiac Enzymes: No results for input(s): CKTOTAL, CKMB, CKMBINDEX, TROPONINI in the last 168 hours. BNP (last 3 results) No results for input(s): PROBNP in the last 8760 hours. HbA1C: No results for input(s): HGBA1C in the last 72 hours. CBG: No results for input(s): GLUCAP in the last 168 hours.  Lipid Profile: No results for input(s): CHOL, HDL, LDLCALC, TRIG, CHOLHDL, LDLDIRECT in the last 72 hours. Thyroid Function Tests: No results for input(s): TSH, T4TOTAL, FREET4, T3FREE, THYROIDAB in the last 72 hours. Anemia Panel: No results for input(s): VITAMINB12, FOLATE, FERRITIN, TIBC, IRON, RETICCTPCT in the last 72 hours. Sepsis Labs: No results for input(s): PROCALCITON, LATICACIDVEN in the last 168 hours.  Recent Results (from the past 240 hour(s))  SARS CORONAVIRUS 2 (TAT 6-24 HRS) Nasopharyngeal Nasopharyngeal Swab     Status: None   Collection Time: 08/01/2020  8:35 PM   Specimen: Nasopharyngeal Swab  Result Value Ref Range Status   SARS Coronavirus 2 NEGATIVE NEGATIVE Final    Comment:  (NOTE) SARS-CoV-2 target nucleic acids are NOT DETECTED.  The SARS-CoV-2 RNA is generally detectable in upper and lower respiratory specimens during the acute phase of infection. Negative results do not preclude SARS-CoV-2 infection, do not rule out co-infections with other pathogens, and should not be used as the sole basis for treatment or other patient management decisions. Negative results must be combined with clinical observations, patient history, and epidemiological information. The expected result is Negative.  Fact Sheet for Patients: SugarRoll.be  Fact Sheet for Healthcare Providers: https://www.woods-mathews.com/  This test is not yet approved or cleared by the [place] FDA and  has been authorized for detection and/or diagnosis of SARS-CoV-2 by FDA under an Emergency Use Authorization (EUA). This EUA will remain  in effect (meaning this test can be used) for the duration of the COVID-19 declaration under Se ction 564(b)(1) of the Act, 21 U.S.C. section 360bbb-3(b)(1), unless the authorization is terminated or revoked sooner.  Performed at [facility], [place] [address] [place],  [address]        Radiology Studies: No results found.    Scheduled Meds:  amLODipine  5 mg Oral Daily   aspirin  81 mg Oral Daily   clopidogrel  75 mg Oral Daily   enoxaparin (LOVENOX) injection  40 mg Subcutaneous Q24H   febuxostat  40 mg Oral Daily   fluticasone furoate-vilanterol  1 puff Inhalation Daily   isosorbide mononitrate  60 mg Oral QHS   leflunomide  20 mg Oral Daily   metoprolol tartrate  25 mg Oral BID   simvastatin  20 mg Oral QHS   umeclidinium bromide  1 puff Inhalation Daily   Continuous Infusions:   LOS: 2 days      Time spent: 20 minutes   Dessa Phi, DO [facility] 08/18/2020, 10:40 AM   Available via Epic secure chat 7am-7pm After these hours, please refer to coverage provider  listed on [email or web address]

## 2020-08-18 NOTE — Progress Notes (Addendum)
HEMATOLOGY-ONCOLOGY PROGRESS NOTE  SUBJECTIVE: Mr. Routh continues to have intermittent chest pain particularly with exertion.  He is sitting up in the recliner at time of visit and denies chest pain currently.  He denies fevers and chills.  Denies shortness of breath.  No bleeding reported.  REVIEW OF SYSTEMS:   Constitutional: Denies fevers, chills  Eyes: Denies blurriness of vision Ears, nose, mouth, throat, and face: Denies mucositis or sore throat Respiratory: Denies cough, dyspnea or wheezes Cardiovascular: Reports intermittent chest pain, worse with exertion Gastrointestinal:  Denies nausea, heartburn or change in bowel habits Skin: Denies abnormal skin rashes Lymphatics: Denies new lymphadenopathy or easy bruising Neurological:Denies numbness, tingling or new weaknesses Behavioral/Psych: Mood is stable, no new changes  Extremities: No lower extremity edema All other systems were reviewed with the patient and are negative.  I have reviewed the past medical history, past surgical history, social history and family history with the patient and they are unchanged from previous note.   PHYSICAL EXAMINATION: ECOG PERFORMANCE STATUS: 2 - Symptomatic, <50% confined to bed  Vitals:   08/18/20 0451 08/18/20 0822  BP: (!) 120/48   Pulse: 67   Resp: 20   Temp:    SpO2: 100% 100%   Filed Weights   08/16/20 0455 08/17/20 0455 08/18/20 0451  Weight: 75.4 kg 74.9 kg 74.8 kg    Intake/Output from previous day: 07/17 0701 - 07/18 0700 In: 120 [P.O.:120] Out: 550 [Urine:550]  GENERAL:alert, no distress and comfortable SKIN: skin color, texture, turgor are normal, no rashes or significant lesions EYES: normal, Conjunctiva are pink and non-injected, sclera clear OROPHARYNX:no exudate, no erythema and lips, buccal mucosa, and tongue normal  LUNGS: clear to auscultation and percussion with normal breathing effort HEART: regular rate & rhythm and no murmurs and no lower extremity  edema ABDOMEN:abdomen soft, non-tender and normal bowel sounds NEURO: alert & oriented x 3 with fluent speech, no focal motor/sensory deficits  LABORATORY DATA:  I have reviewed the data as listed CMP Latest Ref Rng & Units 08/18/2020 08/17/2020 08/16/2020  Glucose 70 - 99 mg/dL 68(L) 97 122(H)  BUN 8 - 23 mg/dL 51(H) 45(H) 43(H)  Creatinine 0.61 - 1.24 mg/dL 1.69(H) 1.68(H) 1.52(H)  Sodium 135 - 145 mmol/L 130(L) 126(L) 130(L)  Potassium 3.5 - 5.1 mmol/L 5.2(H) 4.8 5.1  Chloride 98 - 111 mmol/L 100 100 102  CO2 22 - 32 mmol/L '23 23 23  ' Calcium 8.9 - 10.3 mg/dL 8.4(L) 8.2(L) 8.2(L)  Total Protein 6.5 - 8.1 g/dL - - -  Total Bilirubin 0.3 - 1.2 mg/dL - - -  Alkaline Phos 38 - 126 U/L - - -  AST 15 - 41 U/L - - -  ALT 0 - 44 U/L - - -    Lab Results  Component Value Date   WBC 138.2 (HH) 08/18/2020   HGB 7.1 (L) 08/18/2020   HCT 21.3 (L) 08/18/2020   MCV 83.9 08/18/2020   PLT 107 (L) 08/18/2020   NEUTROABS 64.2 (H) 08/17/2020    DG Chest 2 View  Result Date: 08/07/2020 CLINICAL DATA:  Shortness of breath. EXAM: CHEST - 2 VIEW COMPARISON:  CT 07/14/2020.  Chest x-ray 12/01/2019. FINDINGS: Stable mediastinal prominence consistent with mediastinal fat again noted. Stable cardiomegaly. No pulmonary venous congestion. Chronic interstitial changes are again noted. Superimposed active interstitial process including interstitial edema and or pneumonitis cannot be excluded. Small bilateral pleural effusions are noted. Stable right apical pleural thickening consistent scarring. No pneumothorax. Degenerative change thoracic spine. IMPRESSION:  1.  Stable cardiomegaly.  No pulmonary venous congestion. 2. Chronic interstitial changes are again noted. Superimposed active interstitial process including interstitial edema and or pneumonitis cannot be excluded. Small bilateral pleural effusions are noted. Electronically Signed   By: Marcello Moores  Register   On: 08/07/2020 13:19   CT Angio Chest PE W and/or Wo  Contrast  Result Date: 08/15/2020 CLINICAL DATA:  Chest pain.  Shortness of breath. EXAM: CT ANGIOGRAPHY CHEST WITH CONTRAST TECHNIQUE: Multidetector CT imaging of the chest was performed using the standard protocol during bolus administration of intravenous contrast. Multiplanar CT image reconstructions and MIPs were obtained to evaluate the vascular anatomy. CONTRAST:  75m OMNIPAQUE IOHEXOL 350 MG/ML SOLN COMPARISON:  Plain film of earlier today.  CT 07/14/2020. FINDINGS: Cardiovascular: The quality of this exam for evaluation of pulmonary embolism is good. No evidence of pulmonary embolism. Pulmonary artery enlargement, outflow tract 3.1 cm Aorta not well opacified. Aortic atherosclerosis. Tortuous thoracic aorta. Mild cardiomegaly. Three vessel coronary artery calcification. Mediastinum/Nodes: Prominent mediastinal nodes are again identified, favored to be reactive. Example at up to 1.1 cm in the high right paratracheal station. Lungs/Pleura: Small left greater than right pleural effusions are unchanged. Bilateral calcified pleural plaques. Right greater than left apical pleuroparenchymal scarring. 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 Basilar and peripheral predominant subpleural reticulation with scattered basilar honeycombing. Right lower lobe calcified granuloma. Right middle lobe 5 mm pulmonary nodule is similar on 107/6. Upper Abdomen: Old granulomatous disease in the liver and spleen. Normal imaged portions of the stomach, gallbladder, pancreas, adrenal glands, kidneys. Abdominal aortic atherosclerosis. Musculoskeletal: Lower thoracic spondylosis with mild wedging of the T12 vertebral body. Review of the MIP images confirms the above findings. IMPRESSION: 1.  No evidence of pulmonary embolism. 2. No significant change compared to the CT of 07/14/2020. 3. Interstitial lung disease, consistent with asbestosis and asbestos related pleural disease. 4. Small  bilateral pleural effusions. 5. Coronary artery atherosclerosis. Aortic Atherosclerosis (ICD10-I70.0). 6. Pulmonary artery enlargement suggests pulmonary arterial hypertension. Electronically Signed   By: KAbigail MiyamotoM.D.   On: 08/06/2020 19:18   DG Chest Port 1 View  Result Date: 08/12/2020 CLINICAL DATA:  Chest pain and shortness of breath. Pulmonary fibrosis. EXAM: PORTABLE CHEST 1 VIEW COMPARISON:  08/07/2020 FINDINGS: Stable cardiomediastinal contours. Persistent bilateral pleural effusions identified. Chronic reticular interstitial opacities with a lower lung zone predominance is again seen compatible with diagnosis of pulmonary fibrosis. No superimposed airspace consolidation. IMPRESSION: No change in bilateral pleural effusions and pulmonary fibrosis. Electronically Signed   By: TKerby MoorsM.D.   On: 08/22/2020 13:30   VAS [place]OWER EXTREMITY VENOUS (DVT) (ONLY [facility] & [facility])  Result Date: 08/19/2020  Lower Venous DVT Study Patient Name:  CKELLI EGOLF Date of Exam:   08/16/2020 Medical Rec #: [id number]          Accession #:    [id number] of Birth: [birth year]           Patient Gender: M Patient Age:   80Y [facility]      VAS [place]OWER EXTREMITY VENOUS (DVT) Referring Phys: [id number] HAVILAND --------------------------------------------------------------------------------  Indications: Swelling, and SOB.  Comparison Study: No prior study Performing Technologist: MMaudry MayhewMHA, RDMS, RVT, RDCS  Examination Guidelines: A complete evaluation includes B-mode imaging, spectral Doppler, color Doppler, and power Doppler as needed of all accessible portions of each vessel. Bilateral testing is considered an integral part of  a complete examination. Limited examinations for reoccurring indications may be performed as noted. The reflux portion of the exam is performed with the patient in reverse Trendelenburg.   +---------+---------------+---------+-----------+----------+--------------+ RIGHT    CompressibilityPhasicitySpontaneityPropertiesThrombus Aging +---------+---------------+---------+-----------+----------+--------------+ CFV      Full           Yes      Yes                                 +---------+---------------+---------+-----------+----------+--------------+ SFJ      Full                                                        +---------+---------------+---------+-----------+----------+--------------+ FV Prox  Full                                                        +---------+---------------+---------+-----------+----------+--------------+ FV Mid   Full                                                        +---------+---------------+---------+-----------+----------+--------------+ FV DistalFull                                                        +---------+---------------+---------+-----------+----------+--------------+ PFV      Full                                                        +---------+---------------+---------+-----------+----------+--------------+ POP      Full           Yes      Yes                                 +---------+---------------+---------+-----------+----------+--------------+ PTV      Full                                                        +---------+---------------+---------+-----------+----------+--------------+ PERO     Full                                                        +---------+---------------+---------+-----------+----------+--------------+   +---------+---------------+---------+-----------+----------+--------------+ LEFT     CompressibilityPhasicitySpontaneityPropertiesThrombus Aging +---------+---------------+---------+-----------+----------+--------------+ CFV      Full  Yes      Yes                                  +---------+---------------+---------+-----------+----------+--------------+ SFJ      Full                                                        +---------+---------------+---------+-----------+----------+--------------+ FV Prox  Full                                                        +---------+---------------+---------+-----------+----------+--------------+ FV Mid   Full                                                        +---------+---------------+---------+-----------+----------+--------------+ FV DistalFull                                                        +---------+---------------+---------+-----------+----------+--------------+ PFV      Full                                                        +---------+---------------+---------+-----------+----------+--------------+ POP      Full           Yes      Yes                                 +---------+---------------+---------+-----------+----------+--------------+ PTV      Full                                                        +---------+---------------+---------+-----------+----------+--------------+ PERO     Full                                                        +---------+---------------+---------+-----------+----------+--------------+     Summary: BILATERAL: - No evidence of deep vein thrombosis seen in the lower extremities, bilaterally. -No evidence of popliteal cyst, bilaterally.   *See table(s) above for measurements and observations. Electronically signed by Jamelle Haring on 08/07/2020 at 5:13:17 PM.    Final     ASSESSMENT AND PLAN: 1.  MDS/MPN 2.  Leukocytosis 3.  Anemia 4.  Thrombocytopenia 5.  Chest pain 6.  Pulmonary fibrosis 7.  CAD 8.  Type 2 diabetes mellitus 9.  Hyponatremia, chronic 10.  CKD 11.  Tobacco dependence 12.  RA   -The patient is followed by our office for MDS/MPN.  Currently receives Retacrit.  He received a dose of Retacrit 30,000 units  subcu on 08/15/2020.  We will plan to continue as an outpatient. -Hemoglobin today is down to 7.1.  Recommend PRBC transfusion to keep hemoglobin above 7. -The patient continues to have persistent significant leukocytosis.  This was evaluated last admission and pathology review of peripheral blood smear did not show any evidence of transformation to acute leukemia.  Review of peripheral blood smear this admission suggestive of CML, but thought to be less likely. His WBC remains elevated and continues to slowly rise.  Discussed proceeding with bone marrow biopsy for further evaluation.  Discussed the risk/benefits and rationale with proceeding with this test and he agrees to proceed.  I have entered an order for IR to perform a bone marrow biopsy hopefully on 08/20/2018. -The patient is noted to have thrombocytopenia which was not present during his last hospital admission.  Thrombocytopenia is mild and will monitor for now. -Management of other chronic medical conditions per hospitalist.   LOS: 2 days   Mikey Bussing, DNP, AGPCNP-BC, AOCNP 08/18/20  Addendum  I have seen the patient, examined him. I agree with the assessment and and plan and have edited the notes.   Patient has persistent leukocytosis, with predominant neutrophilia, also elevated lymphocytes and monocytes.  I spoke with pathologist Dr. 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  Given his new thrombocytopenia, unexplained chest pain, I recommend repeating bone marrow biopsy.  Patient is agreeable. Appreciate IR's assistance, they will do biopsy on Wednesday.   Patient does complain some heartburn, and his chest pain is most in the morning when he gets up.  His cardiac work-up has been negative.  I doubt his chest pain is related to leukocytosis, I recommend him to try PPI to see if  that improves his chest pain.  He has been using Tums which helped some.  I ordered Protonix 20 mg twice daily.  He has required IV morphine for his chest pain, he did try oxycodone 5 mg but it did not work well.  I will increase his oxycodone to 10 mg, PRN. If it does not work well, please try oral morphine 46m as needed. he may need prescription pain medicine to go home.  I also recommended 1 unit of red blood cell transfusion tomorrow. Please order. I will add type and cross for tomorrow morning.   If his chest pain is controlled with PPI and oral pain meds, he can be discharged home after bone marrow biopsy and I will see him back next week in office.  YTruitt Merle 08/18/2020

## 2020-08-18 NOTE — Consult Note (Signed)
Chief Complaint: Patient was seen in consultation today for leukocytosis  Referring Physician(s): Dr. Burr Medico  Supervising Physician: Sandi Mariscal  Patient Status: [name] - In-pt  History of Present Illness: Paul [name] is a 80 y.o. male with past medical history of AAA, anemia of chronic disease, CAD, DM, GERD, HTN, MD/MPN now with noteable leukocytosis.  Patient continues with slow, steady increase in leukocytosis daily.  IR consulted for bone marrow biopsy at the request of Dr. Burr Medico.   Past Medical History:  Diagnosis Date   AAA (abdominal aortic aneurysm) ([name])    Abnormal LFTs    AKI (acute kidney injury) ([place])    Anemia of chronic disease    Arthritis    "all over" (12/04/2015)   Atherosclerosis of native coronary artery of native heart without angina pectoris    CAD (coronary artery disease)    with cath 09/26/08 showing severe heart disease in RCA with POBA, mild to mod diesease in circ and LAD   Chronic back pain    back problems prior to surgery    Constipation    Coronary atherosclerosis of native coronary artery    Dehydration    Diabetes mellitus    with neuropathy   Diarrhea    Elevated liver enzymes    Generalized weakness    Gout of right hand    Hiatal hernia    History of gastroesophageal reflux (GERD)    History of stomach ulcers    Hyperlipidemia    Hypertension    Metabolic acidosis    Normochromic anemia 02/23/2016   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   Thrombocytopenia ([place])    Unintentional weight loss 09/27/2017   33 lbs between 4/19 & 8/19   Vitamin B12 deficiency    Vitamin B12 deficiency    Vitamin D deficiency    Weakness     Past Surgical History:  Procedure Laterality Date   ANKLE FRACTURE SURGERY Left    "crushed"   BACK SURGERY  X 3   "fell 4 stories in 1977"   [place] WITH STENT PLACEMENT  09/26/2008   [name] 06/03/2010   FRACTURE SURGERY     LEFT HEART CATHETERIZATION WITH  CORONARY ANGIOGRAM N/A 01/21/2012   Procedure: LEFT HEART CATHETERIZATION WITH CORONARY ANGIOGRAM;  Surgeon: Burnell Blanks, MD;  Location: [facility] CATH LAB;  Service: Cardiovascular;  Laterality: N/A;   [place]; ~ [place]  ~ 1998   "3rd OR to reconstruct my back after fall in 1977"   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 and 2nd    Allergies: Allopurinol and Metformin and related  Medications: Prior to Admission medications   Medication Sig Start Date End Date Taking? Authorizing Provider  amLODipine (NORVASC) 5 MG tablet TAKE 1 TABLET BY MOUTH  DAILY 01/24/20  Yes Burnell Blanks, MD  aspirin 81 MG chewable tablet Chew 81 mg by mouth daily.   Yes [provider]  Budeson-Glycopyrrol-Formoterol (BREZTRI AEROSPHERE) 160-9-4.8 MCG/ACT AERO Inhale 2 puffs into the lungs in the morning and at bedtime. 08/06/20  Yes Freddi Starr, MD  clopidogrel (PLAVIX) 75 MG tablet TAKE 1 TABLET BY MOUTH  DAILY 01/18/20  Yes Denita Lung, MD  ENBREL SURECLICK 50 MG/ML injection Inject 50 mg into the skin once a week.  04/26/19  Yes [provider]  fish oil-omega-3 fatty acids 1000 MG capsule Take 1 g by mouth daily.  Yes [provider]  isosorbide mononitrate (IMDUR) 60 MG 24 hr tablet Take 1 tablet (60 mg total) by mouth at bedtime. 06/27/20  Yes Burnell Blanks, MD  leflunomide (ARAVA) 20 MG tablet Take 20 mg by mouth daily. 05/21/19  Yes [provider]  metoprolol tartrate (LOPRESSOR) 25 MG tablet TAKE 1 TABLET BY MOUTH  TWICE DAILY 07/07/20  Yes Burnell Blanks, MD  Multiple Vitamins-Minerals (CENTRUM) tablet Take 1 tablet by mouth daily.   Yes [provider]  nitroGLYCERIN (NITROSTAT) 0.4 MG SL tablet Place 0.4 mg under the tongue every 5 (five) minutes as needed for chest pain. For 3 doses 10/12/11  Yes Denita Lung, MD  pyridoxine (B-6) 100 MG tablet Take 100 mg by mouth daily.   Yes  [provider]  simvastatin (ZOCOR) 20 MG tablet TAKE 1 TABLET BY MOUTH AT  BEDTIME 01/24/20  Yes Burnell Blanks, MD  febuxostat (ULORIC) 40 MG tablet Take 40 mg by mouth daily.    [provider]     Family History  Problem Relation Age of Onset   Heart attack Mother    Cancer Mother    Heart disease Mother    Heart attack Sister    Heart disease Sister     Social History   Socioeconomic History   Marital status: Married    Spouse name: Scientist, physiological   Number of children: 0   Years of education: Not on file   Highest education level: Not on file  Occupational History   Occupation: retired  Tobacco Use   Smoking status: Every Day    Packs/day: 0.50    Years: 20.00    Pack years: 10.00    Types: Cigars, Cigarettes    Start date: 1961   Smokeless tobacco: Never   Tobacco comments:    Started smoking at age 68, quit at age 35, then restarted at age 42  Vaping Use   Vaping Use: Never used  Substance and Sexual Activity   Alcohol use: Not Currently   Drug use: No   Sexual activity: Not Currently    Partners: Female  Other Topics Concern   Not on file  Social History Narrative   Divorced, 1 child.    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    Owned roofing co. In [place]   Social Determinants of Health   Financial Resource Strain: Not on file  Food Insecurity: Not on file  Transportation Needs: Not on file  Physical Activity: Not on file  Stress: Not on file  Social Connections: Not on file     Review of Systems: A 12 point ROS discussed and pertinent positives are indicated in the HPI above.  All other systems are negative.  Review of Systems  Constitutional:  Negative for fatigue and fever.  Respiratory:  Negative for cough and shortness of breath.   Cardiovascular:  Negative for chest pain.  Gastrointestinal:  Negative for abdominal pain, nausea and vomiting.  Musculoskeletal:  Negative for back pain.   Psychiatric/Behavioral:  Negative for behavioral problems and confusion.    Vital Signs: BP (!) 113/43 (BP Location: Right Arm)   Pulse (!) 54   Temp (!) 97.4 F (36.3 C) (Oral)   Resp 16   Ht 5' 10" (1.778 m)   Wt 165 lb (74.8 kg)   SpO2 99%   BMI 23.68 kg/m   Physical Exam Vitals and nursing note reviewed.  Constitutional:  Appearance: He is well-developed.  Cardiovascular:     Rate and Rhythm: Regular rhythm. Bradycardia present.  Pulmonary:     Effort: Pulmonary effort is normal. No respiratory distress.     Breath sounds: Normal breath sounds.  Skin:    General: Skin is warm and dry.     Coloration: Skin is pale.  Neurological:     General: No focal deficit present.     Mental Status: He is alert.  Psychiatric:        Mood and Affect: Mood normal.        Behavior: Behavior normal.     MD Evaluation Airway: WNL Heart: WNL Abdomen: WNL Chest/ Lungs: WNL ASA  Classification: 3 Mallampati/Airway Score: Two   Imaging: DG Chest 2 View  Result Date: 08/07/2020 CLINICAL DATA:  Shortness of breath. EXAM: CHEST - 2 VIEW COMPARISON:  CT 07/14/2020.  Chest x-ray 12/01/2019. FINDINGS: Stable mediastinal prominence consistent with mediastinal fat again noted. Stable cardiomegaly. No pulmonary venous congestion. Chronic interstitial changes are again noted. Superimposed active interstitial process including interstitial edema and or pneumonitis cannot be excluded. Small bilateral pleural effusions are noted. Stable right apical pleural thickening consistent scarring. No pneumothorax. Degenerative change thoracic spine. IMPRESSION: 1.  Stable cardiomegaly.  No pulmonary venous congestion. 2. Chronic interstitial changes are again noted. Superimposed active interstitial process including interstitial edema and or pneumonitis cannot be excluded. Small bilateral pleural effusions are noted. Electronically Signed   By: Marcello Moores  Register   On: 08/07/2020 13:19   CT Angio Chest PE  W and/or Wo Contrast  Result Date: 08/09/2020 CLINICAL DATA:  Chest pain.  Shortness of breath. EXAM: CT ANGIOGRAPHY CHEST WITH CONTRAST TECHNIQUE: Multidetector CT imaging of the chest was performed using the standard protocol during bolus administration of intravenous contrast. Multiplanar CT image reconstructions and MIPs were obtained to evaluate the vascular anatomy. CONTRAST:  21m OMNIPAQUE IOHEXOL 350 MG/ML SOLN COMPARISON:  Plain film of earlier today.  CT 07/14/2020. FINDINGS: Cardiovascular: The quality of this exam for evaluation of pulmonary embolism is good. No evidence of pulmonary embolism. Pulmonary artery enlargement, outflow tract 3.1 cm Aorta not well opacified. Aortic atherosclerosis. Tortuous thoracic aorta. Mild cardiomegaly. Three vessel coronary artery calcification. Mediastinum/Nodes: Prominent mediastinal nodes are again identified, favored to be reactive. Example at up to 1.1 cm in the high right paratracheal station. Lungs/Pleura: Small left greater than right pleural effusions are unchanged. Bilateral calcified pleural plaques. Right greater than left apical pleuroparenchymal scarring. 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 Basilar and peripheral predominant subpleural reticulation with scattered basilar honeycombing. Right lower lobe calcified granuloma. Right middle lobe 5 mm pulmonary nodule is similar on 107/6. Upper Abdomen: Old granulomatous disease in the liver and spleen. Normal imaged portions of the stomach, gallbladder, pancreas, adrenal glands, kidneys. Abdominal aortic atherosclerosis. Musculoskeletal: Lower thoracic spondylosis with mild wedging of the T12 vertebral body. Review of the MIP images confirms the above findings. IMPRESSION: 1.  No evidence of pulmonary embolism. 2. No significant change compared to the CT of 07/14/2020. 3. Interstitial lung disease, consistent with asbestosis and asbestos related pleural disease. 4.  Small bilateral pleural effusions. 5. Coronary artery atherosclerosis. Aortic Atherosclerosis (ICD10-I70.0). 6. Pulmonary artery enlargement suggests pulmonary arterial hypertension. Electronically Signed   By: KAbigail MiyamotoM.D.   On: 08/28/2020 19:18   DG Chest Port 1 View  Result Date: 08/30/2020 CLINICAL DATA:  Chest pain and shortness of breath. Pulmonary fibrosis. EXAM: PORTABLE CHEST 1 VIEW COMPARISON:  08/07/2020 FINDINGS: Stable cardiomediastinal contours. Persistent bilateral pleural effusions identified. Chronic reticular interstitial opacities with a lower lung zone predominance is again seen compatible with diagnosis of pulmonary fibrosis. No superimposed airspace consolidation. IMPRESSION: No change in bilateral pleural effusions and pulmonary fibrosis. Electronically Signed   By: Kerby Moors M.D.   On: 08/19/2020 13:30   VAS [place] LOWER EXTREMITY VENOUS (DVT) (ONLY [facility] & [facility])  Result Date: 08/29/2020  Lower Venous DVT Study Patient Name:  FREDDI SCHRAGER  Date of Exam:   08/09/2020 Medical Rec #: [id number]           Accession #:    [id number] Date of Birth: Feb 16, 1940            Patient Gender: M Patient Age:   80Y Exam Location:  [facility] Procedure:      VAS [place] LOWER EXTREMITY VENOUS (DVT) Referring Phys: [id number] JULIE HAVILAND --------------------------------------------------------------------------------  Indications: Swelling, and SOB.  Comparison Study: No prior study Performing Technologist: Maudry Mayhew MHA, RDMS, RVT, RDCS  Examination Guidelines: A complete evaluation includes B-mode imaging, spectral Doppler, color Doppler, and power Doppler as needed of all accessible portions of each vessel. Bilateral testing is considered an integral part of a complete examination. Limited examinations for reoccurring indications may be performed as noted. The reflux portion of the exam is performed with the patient in reverse Trendelenburg.   +---------+---------------+---------+-----------+----------+--------------+ RIGHT    CompressibilityPhasicitySpontaneityPropertiesThrombus Aging +---------+---------------+---------+-----------+----------+--------------+ CFV      Full           Yes      Yes                                 +---------+---------------+---------+-----------+----------+--------------+ SFJ      Full                                                        +---------+---------------+---------+-----------+----------+--------------+ FV Prox  Full                                                        +---------+---------------+---------+-----------+----------+--------------+ FV Mid   Full                                                        +---------+---------------+---------+-----------+----------+--------------+ FV DistalFull                                                        +---------+---------------+---------+-----------+----------+--------------+ PFV      Full                                                        +---------+---------------+---------+-----------+----------+--------------+  POP      Full           Yes      Yes                                 +---------+---------------+---------+-----------+----------+--------------+ PTV      Full                                                        +---------+---------------+---------+-----------+----------+--------------+ PERO     Full                                                        +---------+---------------+---------+-----------+----------+--------------+   +---------+---------------+---------+-----------+----------+--------------+ LEFT     CompressibilityPhasicitySpontaneityPropertiesThrombus Aging +---------+---------------+---------+-----------+----------+--------------+ CFV      Full           Yes      Yes                                  +---------+---------------+---------+-----------+----------+--------------+ SFJ      Full                                                        +---------+---------------+---------+-----------+----------+--------------+ FV Prox  Full                                                        +---------+---------------+---------+-----------+----------+--------------+ FV Mid   Full                                                        +---------+---------------+---------+-----------+----------+--------------+ FV DistalFull                                                        +---------+---------------+---------+-----------+----------+--------------+ PFV      Full                                                        +---------+---------------+---------+-----------+----------+--------------+ POP      Full           Yes      Yes                                 +---------+---------------+---------+-----------+----------+--------------+  PTV      Full                                                        +---------+---------------+---------+-----------+----------+--------------+ PERO     Full                                                        +---------+---------------+---------+-----------+----------+--------------+     Summary: BILATERAL: - No evidence of deep vein thrombosis seen in the lower extremities, bilaterally. -No evidence of popliteal cyst, bilaterally.   *See table(s) above for measurements and observations. Electronically signed by Jamelle Haring on 08/17/2020 at 5:13:17 PM.    Final     Labs:  CBC: Recent Labs    08/15/20 0241 08/16/20 0259 08/17/20 0124 08/18/20 0100  WBC 127.9* 129.4* 130.0* 138.2*  HGB 7.8* 7.4* 7.4* 7.1*  HCT 22.9* 21.4* 21.6* 21.3*  PLT 102* 97* 98* 107*    COAGS: Recent Labs    12/01/19 2019  INR 1.2  APTT 36    BMP: Recent Labs    12/10/19 1410 12/20/19 1547 01/11/20 1338 08/07/20 1301  08/15/20 0241 08/16/20 0259 08/17/20 0124 08/18/20 0100  NA 138   < > 136   < > 129* 130* 126* 130*  K 4.7   < > 5.0   < > 4.8 5.1 4.8 5.2*  CL 105   < > 106   < > 102 102 100 100  CO2 23   < > 22   < > _0 GLUCOSE 151*   < > 109*   < > 115* 122* 97 68*  BUN 43*   < > 27   < > 38* 43* 45* 51*  CALCIUM 8.9   < > 8.5*   < > 8.4* 8.2* 8.2* 8.4*  CREATININE 1.57*   < > 1.27   < > 1.39* 1.52* 1.68* 1.69*  GFRNONAA 42*   < > 54*   < > 52* 47* 41* 41*  GFRAA 48*  --  63  --   --   --   --   --    < > = values in this interval not displayed.    LIVER FUNCTION TESTS: Recent Labs    12/10/19 1410 01/11/20 1338 08/07/20 1301 08/08/20 0234  BILITOT 0.8 0.7 1.1 0.8  AST _1 ALT _2 ALKPHOS 115 128* 91 83  PROT 7.8 7.7 7.8 7.8  ALBUMIN 3.0* 3.1* 2.7* 2.8*    TUMOR MARKERS: No results for input(s): AFPTM, CEA, CA199, CHROMGRNA in the last 8760 hours.  Assessment and Plan: Leukocytosis Patient with history of MDS/MPN presented to [facility] ED with chest pain.  Cardiology work-up has thus far been negative.  He was found to have noteable leukocytosis of 131K and prominent leukocytosis.  He recently stopped steroids, however his leukocytosis continues to climb.  IR consulted for bone marrow biopsy at the request of Dr. Burr Medico.  Based on scheduling, the earliest IR may be able to accommodate is Wednesday.  Will make NPO p MN for possible procedure Wednesday AM as schedule allows.  Patient and spouse agreeable.   Risks and benefits of biopsy was discussed with the patient and/or patient's family including, but not limited to bleeding, infection, damage to adjacent structures or low yield requiring additional tests.  All of the questions were answered and there is agreement to proceed.  Consent signed and in chart.  Thank you for this interesting consult.  I greatly enjoyed meeting LEEROY LOVINGS and look forward to participating in their care.  A copy of this report  was sent to the requesting provider on this date.  Electronically Signed: Docia Barrier, PA 08/18/2020, 3:19 PM   I spent a total of 40 Minutes    in face to face in clinical consultation, greater than 50% of which was counseling/coordinating care for leukocytosis.

## 2020-08-18 NOTE — Progress Notes (Signed)
PT Cancellation Note  Patient Details Name: Paul [name] MRN: [id number] DOB: 06-29-1940   Cancelled Treatment:    Reason Eval/Treat Not Completed: Other (comment). Pt doesn't feel good and wants to wait until after his bone marrow biopsy tomorrow to see if he feels like he needs any therapy. In recent visits he has done well with mobility and not needed therapy. Will check back on Wednesday.    Shary Decamp [facility] 08/18/2020, 2:40 PM [place] Pager [phone] Office [phone]

## 2020-08-18 NOTE — Progress Notes (Addendum)
OT Cancellation Note  Patient Details Name: Paul [name] MRN: [id number] DOB: November 05, 1940   Cancelled Treatment:    Reason Eval/Treat Not Completed: Patient declined, no reason specified (Upon OT arrival, pt immediately refusing to participate in therapy, given max encouragement pt reporting that he will not get OOB today. OT will eval as time and pt allow.)  Addendum: Pt refused on second attempt, he would like to determine need for therapies after bone marrow biopsy tomorrow [phone]  Fredia Chittenden A Mattheus Rauls 08/18/2020, 9:22 AM

## 2020-08-19 DIAGNOSIS — R0789 Other chest pain: Secondary | ICD-10-CM | POA: Diagnosis not present

## 2020-08-19 LAB — BASIC METABOLIC PANEL
Anion gap: 5 (ref 5–15)
BUN: 57 mg/dL — ABNORMAL HIGH (ref 8–23)
CO2: 23 mmol/L (ref 22–32)
Calcium: 8.4 mg/dL — ABNORMAL LOW (ref 8.9–10.3)
Chloride: 102 mmol/L (ref 98–111)
Creatinine, Ser: 1.94 mg/dL — ABNORMAL HIGH (ref 0.61–1.24)
GFR, Estimated: 35 mL/min — ABNORMAL LOW (ref >60)
Glucose, Bld: 138 mg/dL — ABNORMAL HIGH (ref 70–99)
Potassium: 5.2 mmol/L — ABNORMAL HIGH (ref 3.5–5.1)
Sodium: 130 mmol/L — ABNORMAL LOW (ref 135–145)

## 2020-08-19 LAB — CBC
HCT: 21.3 % — ABNORMAL LOW (ref 39.0–52.0)
Hemoglobin: 7.2 g/dL — ABNORMAL LOW (ref 13.0–17.0)
MCH: 28.1 pg (ref 26.0–34.0)
MCHC: 33.8 g/dL (ref 30.0–36.0)
MCV: 83.2 fL (ref 80.0–100.0)
Platelets: 114 10*3/uL — ABNORMAL LOW (ref 150–400)
RBC: 2.56 MIL/uL — ABNORMAL LOW (ref 4.22–5.81)
RDW: 21.9 % — ABNORMAL HIGH (ref 11.5–15.5)
WBC: 133.9 10*3/uL (ref 4.0–10.5)
nRBC: 0.7 % — ABNORMAL HIGH (ref 0.0–0.2)

## 2020-08-19 LAB — PREPARE RBC (CROSSMATCH)

## 2020-08-19 MED ORDER — SODIUM ZIRCONIUM CYCLOSILICATE 10 G PO PACK
10.0000 g | PACK | Freq: Once | ORAL | Status: AC
  Administered 2020-08-19: 10 g via ORAL
  Filled 2020-08-19: qty 1

## 2020-08-19 MED ORDER — SODIUM CHLORIDE 0.9% IV SOLUTION: Freq: Once | INTRAVENOUS | Status: AC

## 2020-08-19 MED ORDER — SODIUM CHLORIDE 0.9 % IV SOLN: INTRAVENOUS | Status: DC

## 2020-08-19 NOTE — Plan of Care (Signed)

## 2020-08-19 NOTE — Consult Note (Signed)
   [facility] Inpatient Consult   08/19/2020  DENVER BENTSON Jul 06, 1940 [id number]  [place] [facility] [ACO] Patient: [employer] Medicare  Primary Care Provider:  Denita Lung, MD, [facility]  Patient screened for less than 7 days readmission hospitalization with noted high risk score for unplanned readmission risk and  to assess for potential [facility] service needs for post hospital transition.  Review of patient's medical record reveals patient is for ongoing testing, will follow PT/OT and inpatient [facility] team for post hospital needs.  Plan:  Continue to follow progress and disposition to assess for post hospital care management needs.    For questions contact:   Natividad Brood, RN BSN [place] [place] [facility] Liaison  [phone] business mobile phone Toll free office [phone]  Fax number: [phone] [email or web address]

## 2020-08-19 NOTE — Progress Notes (Signed)
PROGRESS NOTE    Paul [name]  AFB:[id number] DOB: [birth year] DOA: 08/04/2020 PCP: Denita Lung, MD     Brief Narrative:  Paul [name] is a 80 y.o. male with medical history significant for CAD, pulmonary fibrosis, rheumatoid arthritis, CKD 3a, hypertension, infrarenal AAA, dilation of ascending thoracic aorta, MDS/MPN, now presenting to emergency department for evaluation of chest pain.  Patient was admitted to the hospital 1 week ago with chest pain, evaluated by cardiology with low suspicion for cardiac etiology.  He has continued to have frequent episodes of chest pain throughout the day and night since his discharge on 08/09/2020, seems to be worse when he lays down, involving the upper chest and shoulders, improved with nitroglycerin and sitting upright.  He has been sleeping in a recliner for the past few days due to the symptoms.  He denies any recent change in his exertional dyspnea and denies any fevers or chills.  He denies headache, change in vision, or focal numbness or weakness. Bilateral lower extremity venous Dopplers were negative for DVT.  CTA chest with no change in bilateral pleural effusions and pulmonary fibrosis. High-sensitivity troponin was normal x3.  Cardiology and oncology were consulted.  Cardiology did not feel that patient's chest pain was cardiac in etiology.  New events last 24 hours / Subjective: No new issues, getting his blood transfusion this morning.  Bone marrow biopsy scheduled for tomorrow  Assessment & Plan:   Principal Problem:   Chest pain Active Problems:   CAD (coronary artery disease)   Hypertension complicating diabetes ([name])   Anemia of chronic disease   AAA (abdominal aortic aneurysm) ([name])-infrarenal 3.4 cm   Type 2 diabetes mellitus with diabetic neuropathy, without long-term current use of insulin ([name])   MDS/MPN (myelodysplastic/myeloproliferative neoplasms) ([name])   Chronic low back pain without sciatica   Rheumatoid arthritis  ([name])   Pulmonary fibrosis ([name])   Chronic kidney disease, stage 3a ([name])   Thrombocytopenia ([name])   Leukocytosis   Recurrent chest pain -Troponin negative -CTA chest negative for PE -Cardiology signed off 7/15, did not feel that this was cardiac in nature, recommended hematology/oncology to evaluate for leukostasis  MDS -Oncology following, peripheral smear read by pathology as marked neutrophilia suspicious for CML -Dr. Burr Medico following, tentative bone marrow biopsy tmrw 7/20 -1 unit pRBC transfusion today   CAD -Continue zocor, lopressor, Imdur -Hold aspirin and plavix pending bone marrow biopsy. His last dose was 7/18   Chronic diastolic heart failure -Does not appear volume overloaded, does have some chronic venous insufficiency  Pulmonary fibrosis -Remains stable at this time  Rheumatoid arthritis -Continue leflunomide  AKI on CKD stage IIIa -Baseline creatinine around 1.2-1.7  -Has slowly increased last several days, give IVF  Hypertension -Continue Norvasc, Lopressor  Mild hyperkalemia -Lokelma x 1     DVT prophylaxis:  enoxaparin (LOVENOX) injection 40 mg Start: 08/17/2020 2145  Code Status:     Code Status Orders  (From admission, onward)           Start     Ordered   08/13/2020 2136  Full code  Continuous        08/11/2020 2138           Code Status History     Date Active Date Inactive Code Status Order ID Comments User Context   08/07/2020 1710 08/09/2020 2035 Full Code [id number]  Orma Flaming, MD ED   12/01/2019 2325 12/06/2019 1940 Full Code [id number]  Shela Leff, MD ED  09/02/2018 0452 09/03/2018 2059 Full Code [id number]  Elwyn Reach, MD ED   12/04/2015 1630 12/08/2015 1941 Full Code [id number]  Samella Parr, NP Inpatient   04/18/2015 0819 04/22/2015 2338 Full Code [id number]  Robbie Lis, MD Inpatient      Advance Directive Documentation    Flowsheet Row Most Recent Value  Type of Advance Directive Healthcare Power of  Attorney, Living will  Pre-existing out of facility DNR order (yellow form or pink MOST form) --  "MOST" Form in Place? --      Family Communication: Wife and sister at bedside Disposition Plan:  Status is: Inpatient  Remains inpatient appropriate because:IV treatments appropriate due to intensity of illness or inability to take PO  Dispo: The patient is from: Home              Anticipated d/c is to: Home              Patient currently is not medically stable to d/c.   Difficult to place patient No          Consultants:  Cardiology Oncology   Antimicrobials:  Anti-infectives (From admission, onward)    None        Objective: Vitals:   08/18/20 2004 08/19/20 0530 08/19/20 0900 08/19/20 0929  BP: (!) 129/48 (!) 136/45 (!) 122/45 (!) 124/46  Pulse:  64 60 62  Resp: _0 Temp: 97.6 F (36.4 C) 98.2 F (36.8 C) (!) 97.5 F (36.4 C) (!) 97.3 F (36.3 C)  TempSrc: Axillary Oral Oral Oral  SpO2:  99% 98%   Weight:  75.3 kg    Height:        Intake/Output Summary (Last 24 hours) at 08/19/2020 1141 Last data filed at 08/19/2020 0534 Gross per 24 hour  Intake --  Output 300 ml  Net -300 ml    Filed Weights   08/17/20 0455 08/18/20 0451 08/19/20 0530  Weight: 74.9 kg 74.8 kg 75.3 kg   Examination: General exam: Appears calm and comfortable  Respiratory system: Clear to auscultation. Respiratory effort normal. Cardiovascular system: S1 & S2 heard, RRR. No pedal edema. Gastrointestinal system: Abdomen is nondistended, soft and nontender. Normal bowel sounds heard. Central nervous system: Alert and oriented. Non focal exam. Speech clear  Extremities: Symmetric in appearance bilaterally  Skin: No rashes, lesions or ulcers on exposed skin  Psychiatry: Judgement and insight appear stable. Mood & affect appropriate.    Data Reviewed: I have personally reviewed following labs and imaging studies  CBC: Recent Labs  Lab 08/15/20 0241 08/16/20 0259  08/17/20 0124 08/18/20 0100 08/19/20 0141  WBC 127.9* 129.4* 130.0* 138.2* 133.9*  NEUTROABS 73.4* 66.1* 64.2*  --   --   HGB 7.8* 7.4* 7.4* 7.1* 7.2*  HCT 22.9* 21.4* 21.6* 21.3* 21.3*  MCV 83.0 82.6 83.4 83.9 83.2  PLT 102* 97* 98* 107* 114*    Basic Metabolic Panel: Recent Labs  Lab 08/15/20 0241 08/16/20 0259 08/17/20 0124 08/18/20 0100 08/19/20 0141  NA 129* 130* 126* 130* 130*  K 4.8 5.1 4.8 5.2* 5.2*  CL 102 102 100 100 102  CO2 _1 GLUCOSE 115* 122* 97 68* 138*  BUN 38* 43* 45* 51* 57*  CREATININE 1.39* 1.52* 1.68* 1.69* 1.94*  CALCIUM 8.4* 8.2* 8.2* 8.4* 8.4*    GFR: Estimated Creatinine Clearance: 32.4 mL/min (A) (by C-G formula based on SCr of 1.94 mg/dL (H)). Liver Function Tests: No results for  input(s): AST, ALT, ALKPHOS, BILITOT, PROT, ALBUMIN in the last 168 hours. No results for input(s): LIPASE, AMYLASE in the last 168 hours. No results for input(s): AMMONIA in the last 168 hours. Coagulation Profile: No results for input(s): INR, PROTIME in the last 168 hours. Cardiac Enzymes: No results for input(s): CKTOTAL, CKMB, CKMBINDEX, TROPONINI in the last 168 hours. BNP (last 3 results) No results for input(s): PROBNP in the last 8760 hours. HbA1C: No results for input(s): HGBA1C in the last 72 hours. CBG: No results for input(s): GLUCAP in the last 168 hours.  Lipid Profile: No results for input(s): CHOL, HDL, LDLCALC, TRIG, CHOLHDL, LDLDIRECT in the last 72 hours. Thyroid Function Tests: No results for input(s): TSH, T4TOTAL, FREET4, T3FREE, THYROIDAB in the last 72 hours. Anemia Panel: No results for input(s): VITAMINB12, FOLATE, FERRITIN, TIBC, IRON, RETICCTPCT in the last 72 hours. Sepsis Labs: No results for input(s): PROCALCITON, LATICACIDVEN in the last 168 hours.  Recent Results (from the past 240 hour(s))  SARS CORONAVIRUS 2 (TAT 6-24 HRS) Nasopharyngeal Nasopharyngeal Swab     Status: None   Collection Time: 08/16/2020  8:35  PM   Specimen: Nasopharyngeal Swab  Result Value Ref Range Status   SARS Coronavirus 2 NEGATIVE NEGATIVE Final    Comment: (NOTE) SARS-CoV-2 target nucleic acids are NOT DETECTED.  The SARS-CoV-2 RNA is generally detectable in upper and lower respiratory specimens during the acute phase of infection. Negative results do not preclude SARS-CoV-2 infection, do not rule out co-infections with other pathogens, and should not be used as the sole basis for treatment or other patient management decisions. Negative results must be combined with clinical observations, patient history, and epidemiological information. The expected result is Negative.  Fact Sheet for Patients: SugarRoll.be  Fact Sheet for Healthcare Providers: https://www.woods-mathews.com/  This test is not yet approved or cleared by the [place] FDA and  has been authorized for detection and/or diagnosis of SARS-CoV-2 by FDA under an Emergency Use Authorization (EUA). This EUA will remain  in effect (meaning this test can be used) for the duration of the COVID-19 declaration under Se ction 564(b)(1) of the Act, 21 U.S.C. section 360bbb-3(b)(1), unless the authorization is terminated or revoked sooner.  Performed at [facility], [place] [address] [place], [place] [address]        Radiology Studies: No results found.    Scheduled Meds:  amLODipine  5 mg Oral Daily   enoxaparin (LOVENOX) injection  40 mg Subcutaneous Q24H   febuxostat  40 mg Oral Daily   fluticasone furoate-vilanterol  1 puff Inhalation Daily   isosorbide mononitrate  60 mg Oral QHS   leflunomide  20 mg Oral Daily   metoprolol tartrate  25 mg Oral BID   pantoprazole  20 mg Oral BID   simvastatin  20 mg Oral QHS   umeclidinium bromide  1 puff Inhalation Daily   Continuous Infusions:  sodium chloride Stopped (08/19/20 0901)     LOS: 3 days      Time spent: 20 minutes   Dessa Phi,  DO [facility] 08/19/2020, 11:41 AM   Available via Epic secure chat 7am-7pm After these hours, please refer to coverage provider listed on [email or web address]

## 2020-08-19 NOTE — Progress Notes (Signed)
OT Cancellation Note  Patient Details Name: Paul [name] MRN: [id number] DOB: 04/28/1940   Cancelled Treatment:    Reason Eval/Treat Not Completed: Patient declined, no reason specified (Pt bone marrow biopsy scheduled for tomorrow, pt continuing to decline therapy and prefers to determine his need after his bone marrow biopsy. OT eval will continue to follow as pt allows.)   A  08/19/2020, 1:55 PM 

## 2020-08-20 ENCOUNTER — Inpatient Hospital Stay (HOSPITAL_COMMUNITY): Payer: Medicare Other

## 2020-08-20 ENCOUNTER — Encounter (HOSPITAL_COMMUNITY): Payer: Self-pay | Admitting: Family Medicine

## 2020-08-20 DIAGNOSIS — R079 Chest pain, unspecified: Secondary | ICD-10-CM | POA: Diagnosis not present

## 2020-08-20 DIAGNOSIS — R6251 Failure to thrive (child): Secondary | ICD-10-CM

## 2020-08-20 DIAGNOSIS — N179 Acute kidney failure, unspecified: Secondary | ICD-10-CM

## 2020-08-20 DIAGNOSIS — N1831 Chronic kidney disease, stage 3a: Secondary | ICD-10-CM | POA: Diagnosis not present

## 2020-08-20 DIAGNOSIS — R627 Adult failure to thrive: Secondary | ICD-10-CM

## 2020-08-20 DIAGNOSIS — E876 Hypokalemia: Secondary | ICD-10-CM

## 2020-08-20 DIAGNOSIS — D696 Thrombocytopenia, unspecified: Secondary | ICD-10-CM

## 2020-08-20 LAB — CBC
HCT: 25.6 % — ABNORMAL LOW (ref 39.0–52.0)
Hemoglobin: 8.5 g/dL — ABNORMAL LOW (ref 13.0–17.0)
MCH: 27.4 pg (ref 26.0–34.0)
MCHC: 33.2 g/dL (ref 30.0–36.0)
MCV: 82.6 fL (ref 80.0–100.0)
Platelets: 101 10*3/uL — ABNORMAL LOW (ref 150–400)
RBC: 3.1 MIL/uL — ABNORMAL LOW (ref 4.22–5.81)
RDW: 21.9 % — ABNORMAL HIGH (ref 11.5–15.5)
WBC: 134.4 10*3/uL (ref 4.0–10.5)
nRBC: 0.7 % — ABNORMAL HIGH (ref 0.0–0.2)

## 2020-08-20 LAB — BASIC METABOLIC PANEL
Anion gap: 7 (ref 5–15)
BUN: 62 mg/dL — ABNORMAL HIGH (ref 8–23)
CO2: 21 mmol/L — ABNORMAL LOW (ref 22–32)
Calcium: 8.3 mg/dL — ABNORMAL LOW (ref 8.9–10.3)
Chloride: 103 mmol/L (ref 98–111)
Creatinine, Ser: 2.08 mg/dL — ABNORMAL HIGH (ref 0.61–1.24)
GFR, Estimated: 32 mL/min — ABNORMAL LOW (ref >60)
Glucose, Bld: 106 mg/dL — ABNORMAL HIGH (ref 70–99)
Potassium: 5.6 mmol/L — ABNORMAL HIGH (ref 3.5–5.1)
Sodium: 131 mmol/L — ABNORMAL LOW (ref 135–145)

## 2020-08-20 LAB — BPAM RBC
Blood Product Expiration Date: 202208202359
ISSUE DATE / TIME: 202207190907
Unit Type and Rh: 5100

## 2020-08-20 LAB — TYPE AND SCREEN
ABO/RH(D): O POS
Antibody Screen: NEGATIVE
Unit division: 0

## 2020-08-20 LAB — PHOSPHORUS: Phosphorus: 5.5 mg/dL — ABNORMAL HIGH (ref 2.5–4.6)

## 2020-08-20 LAB — URIC ACID: Uric Acid, Serum: 5.5 mg/dL (ref 3.7–8.6)

## 2020-08-20 MED ORDER — SODIUM ZIRCONIUM CYCLOSILICATE 10 G PO PACK
10.0000 g | PACK | Freq: Every day | ORAL | Status: DC
  Administered 2020-08-20 – 2020-08-22 (×3): 10 g via ORAL
  Filled 2020-08-20 (×4): qty 1

## 2020-08-20 MED ORDER — METOPROLOL TARTRATE 12.5 MG HALF TABLET
12.5000 mg | ORAL_TABLET | Freq: Two times a day (BID) | ORAL | Status: DC
  Administered 2020-08-20 – 2020-08-22 (×5): 12.5 mg via ORAL
  Filled 2020-08-20 (×5): qty 1

## 2020-08-20 MED ORDER — MIDAZOLAM HCL 2 MG/2ML IJ SOLN
INTRAMUSCULAR | Status: AC | PRN
  Administered 2020-08-20: 1 mg via INTRAVENOUS
  Administered 2020-08-20: 0.5 mg via INTRAVENOUS

## 2020-08-20 MED ORDER — MIDAZOLAM HCL 2 MG/2ML IJ SOLN
INTRAMUSCULAR | Status: AC
  Filled 2020-08-20: qty 2

## 2020-08-20 MED ORDER — FENTANYL CITRATE (PF) 100 MCG/2ML IJ SOLN
INTRAMUSCULAR | Status: AC | PRN
  Administered 2020-08-20 (×2): 25 ug via INTRAVENOUS

## 2020-08-20 MED ORDER — LIDOCAINE HCL 1 % IJ SOLN
INTRAMUSCULAR | Status: AC
  Filled 2020-08-20: qty 10

## 2020-08-20 MED ORDER — SODIUM CHLORIDE 0.9 % IV SOLN: INTRAVENOUS | Status: AC

## 2020-08-20 MED ORDER — FENTANYL CITRATE (PF) 100 MCG/2ML IJ SOLN
INTRAMUSCULAR | Status: AC
  Filled 2020-08-20: qty 2

## 2020-08-20 NOTE — Plan of Care (Signed)

## 2020-08-20 NOTE — Evaluation (Signed)
Physical Therapy Evaluation Patient Details Name: Paul [name] MRN: [id number] DOB: 05-31-1940 Today's Date: 08/20/2020   History of Present Illness  Paul [name] is a 80 y.o. male presenting to emergency department for evaluation of chest pain. CTA chest with no change in bilateral pleural effusions and pulmonary fibrosis. High-sensitivity troponin was normal x3. He was admitted to the hospital 1 week ago,  evaluated by cardiology with low suspicion for cardiac etiology, discharged 7/9. Pt with medical history significant for CAD, pulmonary fibrosis, rheumatoid arthritis, CKD 3a, hypertension, infrarenal AAA, dilation of ascending thoracic aorta, and MDS/MPN.  Clinical Impression  Pt presents to PT with deficits in activity tolerance, power, functional mobility. PT eval is limited due to pt unwillingness to ambulate or mobilize away from the edge of bed, pt also citing fatigue. Pt will benefit from aggressive mobilization to improve strength and activity tolerance, as well as to further assess gait. PT recommends home health PT currently, however discharge recommendations may change pending further gait and mobility assessment.    Follow Up Recommendations Home health PT;Supervision for mobility/OOB    Equipment Recommendations   (TBD)    Recommendations for Other Services       Precautions / Restrictions Precautions Precautions: Fall Restrictions Weight Bearing Restrictions: No      Mobility  Bed Mobility Overal bed mobility: Modified Independent             General bed mobility comments: HOB elevated for sup to sit    Transfers Overall transfer level: Needs assistance Equipment used: None Transfers: Sit to/from Stand Sit to Stand:  (close supervision)         General transfer comment: pt performs 3 sit to stands during session, quickly sits after all 3, difficult to assess standing balance due to limited duration of  standing  Ambulation/Gait Ambulation/Gait assistance:  (pt refuses)              Stairs            Wheelchair Mobility    Modified Rankin (Stroke Patients Only)       Balance Overall balance assessment: Needs assistance Sitting-balance support: No upper extremity supported;Feet supported Sitting balance-Leahy Scale: Good     Standing balance support:  (unable to assess, pt only stands for brief 1-2 second periods with lower leg touching bed and possibly providing some support. Pt refuses to stand for longer or mobilize away from bedside)                                 Pertinent Vitals/Pain Pain Assessment: No/denies pain    Home Living Family/patient expects to be discharged to:: Private residence Living Arrangements: Spouse/significant other;Other relatives (sister near 24/7) Available Help at Discharge: Family;Available 24 hours/day Type of Home: House Home Access: Stairs to enter Entrance Stairs-Rails:  (railing reported, unsure of side) Entrance Stairs-Number of Steps: 2 Home Layout: One level Home Equipment: Walker - 2 wheels;Grab bars - tub/shower;Bedside commode      Prior Function Level of Independence: Needs assistance   Gait / Transfers Assistance Needed: sister reports the patient ambulated household distances, has been growing progressively weaker over the past few months           Hand Dominance        Extremity/Trunk Assessment   Upper Extremity Assessment Upper Extremity Assessment: Generalized weakness    Lower Extremity Assessment Lower Extremity Assessment: Generalized weakness  Cervical / Trunk Assessment Cervical / Trunk Assessment: Normal  Communication   Communication: No difficulties  Cognition Arousal/Alertness: Lethargic;Suspect due to medications (received fentanyl during bone biopsy) Behavior During Therapy: Impulsive Overall Cognitive Status: Impaired/Different from baseline Area of Impairment:  Problem solving                             Problem Solving: Slow processing General Comments: pt with slowed processing, responds minimally verbally during session. Possibly some reduced awareness of deficits at this time.      General Comments General comments (skin integrity, edema, etc.): VSS, pt on 2L [place] during session    Exercises     Assessment/Plan    PT Assessment Patient needs continued PT services  PT Problem List Decreased strength;Decreased activity tolerance;Decreased balance;Decreased mobility;Decreased safety awareness       PT Treatment Interventions DME instruction;Gait training;Stair training;Functional mobility training;Therapeutic activities;Therapeutic exercise;Balance training;Cognitive remediation;Patient/family education    PT Goals (Current goals can be found in the Care Plan section)  Acute Rehab PT Goals Patient Stated Goal: to improve strength and return to independence PT Goal Formulation: With family Time For Goal Achievement: 09/03/20 Potential to Achieve Goals: Fair    Frequency Min 3X/week   Barriers to discharge        Co-evaluation               AM-PAC PT "6 Clicks" Mobility  Outcome Measure Help needed turning from your back to your side while in a flat bed without using bedrails?: None Help needed moving from lying on your back to sitting on the side of a flat bed without using bedrails?: None Help needed moving to and from a bed to a chair (including a wheelchair)?: A Little Help needed standing up from a chair using your arms (e.g., wheelchair or bedside chair)?: A Little Help needed to walk in hospital room?: A Lot Help needed climbing 3-5 steps with a railing? : A Lot 6 Click Score: 18    End of Session Equipment Utilized During Treatment: Oxygen Activity Tolerance: Other (comment) (pt is self-limiting during session, also reports fatigue limiting willingness to participate in PT session) Patient left: in  bed;with call bell/phone within reach;with bed alarm set;with family/visitor present Nurse Communication: Mobility status PT Visit Diagnosis: Other abnormalities of gait and mobility (R26.89);Muscle weakness (generalized) (M62.81)    Time: 7416-3845 PT Time Calculation (min) (ACUTE ONLY): 11 min   Charges:   PT Evaluation $PT Eval Low Complexity: 1 Low          Zenaida Niece, PT, DPT Acute Rehabilitation Pager: [phone]   Zenaida Niece 08/20/2020, 4:18 PM

## 2020-08-20 NOTE — Care Management Important Message (Signed)
Important Message  Patient Details  Name: Paul [name] MRN: [id number] Date of Birth: Nov 20, 1940   Medicare Important Message Given:  Yes     Shelda Altes 08/20/2020, 10:53 AM

## 2020-08-20 NOTE — Progress Notes (Signed)
Pt's sister ,Lexie Morini requests that OxyCODONE not be given to Pt because it causes him to hallucinate.  Can oxyCODONE be taken off Pt's medicine list so the nursing staff will not administer it to patient again

## 2020-08-20 NOTE — Procedures (Signed)
Interventional Radiology Procedure Note  Procedure: CT guided bone marrow aspiration and biopsy  Complications: None  EBL: < 10 mL  Findings: Aspirate and core biopsy performed of bone marrow in right iliac bone.  Plan: Bedrest supine x 1 hrs  Chenell Lozon T. Marixa Mellott, M.D Pager:  [phone]   

## 2020-08-20 NOTE — Sedation Documentation (Signed)
Prior to procedure patient was alert and oriented x4.  When telling patient my name patient stated, "you are a hillbilly with that name."  At that point I continued on with pre-procedure questions.  When I was finishing up I let the patient know that Dr. Kathlene Cote would be in to see him shortly to review the procedure. Patient stated "he is a [place]."  Patient stated that twice. Upon entering patient's bay again he continued with racial slurs. I let him know that was rude and not appropriate.  Patient apologized and said "yes ma'am" to everything afterwards. Patient did well during procedure and after procedure. Vital signs remain stable. Nothing further needed at this time.

## 2020-08-20 NOTE — Progress Notes (Signed)
Occupational Therapy Evaluation Patient Details Name: Paul [name] MRN: [id number] DOB: [birth year] Today's Date: 08/20/2020    History of Present Illness Paul [name] is a 80 y.o. male presenting to emergency department for evaluation of chest pain. CTA chest with no change in bilateral pleural effusions and pulmonary fibrosis. High-sensitivity troponin was normal x3. He was admitted to the hospital 1 week ago,  evaluated by cardiology with low suspicion for cardiac etiology, discharged 7/9. Pt with medical history significant for CAD, pulmonary fibrosis, rheumatoid arthritis, CKD 3a, hypertension, infrarenal AAA, dilation of ascending thoracic aorta, and MDS/MPN.   Clinical Impression   Pt admitted for concerns listed above. PTA pt and family reported that he was independent with all ADL's. At this time, pt demonstrated increased fatigue and refused to complete any OOB mobility. Pt presented with increased weakness, however unsure this is true weakness or effort, as pt was able to complete chair push ups and lateral scoots with no assist, however when asked pt was unable to hold arms up against any resistance. Acute OT will follow to assist with deficits listed below.     Follow Up Recommendations  Home health OT;Supervision - Intermittent    Equipment Recommendations  Tub/shower bench    Recommendations for Other Services       Precautions / Restrictions Precautions Precautions: Fall Restrictions Weight Bearing Restrictions: No      Mobility Bed Mobility Overal bed mobility: Modified Independent             General bed mobility comments: HOB elevated for sup to sit    Transfers Overall transfer level: Needs assistance Equipment used: None Transfers: Sit to/from Stand;Lateral/Scoot Transfers Sit to Stand: Min guard        Lateral/Scoot Transfers: Supervision General transfer comment: Pt performed a sit<>stand and lateral scoots from the bottom of the bed  to the top of the bed. Refusing to ambulate at this time.    Balance Overall balance assessment: Needs assistance Sitting-balance support: No upper extremity supported;Feet supported Sitting balance-Leahy Scale: Good     Standing balance support:  (Pt does not stand for long enough to assess)                               ADL either performed or assessed with clinical judgement   ADL Overall ADL's : Needs assistance/impaired Eating/Feeding: Set up;Sitting   Grooming: Set up;Sitting   Upper Body Bathing: Min guard;Sitting   Lower Body Bathing: Minimal assistance;Sitting/lateral leans   Upper Body Dressing : Min guard;Sitting   Lower Body Dressing: Minimal assistance;Sit to/from stand;Sitting/lateral leans   Toilet Transfer: Minimal assistance;Stand-pivot   Toileting- Clothing Manipulation and Hygiene: Minimal assistance;Sit to/from stand;Sitting/lateral lean   Tub/ Shower Transfer: Minimal assistance;Stand-pivot   Functional mobility during ADLs: Minimal assistance General ADL Comments: Pt weak this session, unsure whether it's due to effort or weakness.     Vision Baseline Vision/History: No visual deficits Patient Visual Report: No change from baseline Vision Assessment?: No apparent visual deficits     Perception Perception Perception Tested?: No   Praxis Praxis Praxis tested?: Not tested    Pertinent Vitals/Pain Pain Assessment: No/denies pain     Hand Dominance Right   Extremity/Trunk Assessment Upper Extremity Assessment Upper Extremity Assessment: Generalized weakness   Lower Extremity Assessment Lower Extremity Assessment: Defer to PT evaluation   Cervical / Trunk Assessment Cervical / Trunk Assessment: Normal   Communication Communication Communication: No  difficulties   Cognition Arousal/Alertness: Lethargic;Suspect due to medications Behavior During Therapy: Impulsive Overall Cognitive Status: Impaired/Different from  baseline Area of Impairment: Problem solving                             Problem Solving: Slow processing General Comments: pt with slowed processing, responds minimally verbally during session. Possibly some reduced awareness of deficits at this time.   General Comments  VSS, pt on 2L [place] during session    Exercises     Shoulder Instructions      Home Living Family/patient expects to be discharged to:: Private residence Living Arrangements: Spouse/significant other;Other relatives Available Help at Discharge: Family;Available 24 hours/day Type of Home: House Home Access: Stairs to enter [employer] of Steps: 2 Entrance Stairs-Rails:  (railing reported, unsure of side) Home Layout: One level     Bathroom Shower/Tub: Teacher, early years/pre: Standard     Home Equipment: Walker - 2 wheels;Grab bars - tub/shower;Bedside commode          Prior Functioning/Environment Level of Independence: Needs assistance  Gait / Transfers Assistance Needed: sister reports the patient ambulated household distances, has been growing progressively weaker over the past few months              OT Problem List: Decreased strength;Impaired balance (sitting and/or standing);Decreased activity tolerance;Decreased cognition;Decreased safety awareness      OT Treatment/Interventions: Self-care/ADL training;Therapeutic exercise;Energy conservation;DME and/or AE instruction;Therapeutic activities;Patient/family education;Balance training    OT Goals(Current goals can be found in the care plan section) Acute Rehab OT Goals Patient Stated Goal: to improve strength and return to independence OT Goal Formulation: With patient/family Time For Goal Achievement: 09/03/20 Potential to Achieve Goals: Fair ADL Goals Pt Will Perform Lower Body Bathing: with modified independence;sit to/from stand;sitting/lateral leans Pt Will Perform Lower Body Dressing: with modified  independence;sitting/lateral leans;sit to/from stand Pt Will Transfer to Toilet: with modified independence;stand pivot transfer Pt Will Perform Toileting - Clothing Manipulation and hygiene: with modified independence;sitting/lateral leans;sit to/from stand  OT Frequency: Min 2X/week   Barriers to D/C:            Co-evaluation              AM-PAC OT "6 Clicks" Daily Activity     Outcome Measure Help from another person eating meals?: A Little Help from another person taking care of personal grooming?: A Little Help from another person toileting, which includes using toliet, bedpan, or urinal?: A Little Help from another person bathing (including washing, rinsing, drying)?: A Little Help from another person to put on and taking off regular upper body clothing?: A Little Help from another person to put on and taking off regular lower body clothing?: A Little 6 Click Score: 18   End of Session Nurse Communication: Mobility status  Activity Tolerance: Patient tolerated treatment well Patient left: in bed;with bed alarm set;with call bell/phone within reach  OT Visit Diagnosis: Unsteadiness on feet (R26.81);Other abnormalities of gait and mobility (R26.89);Muscle weakness (generalized) (M62.81)                Time: 2637-8588 OT Time Calculation (min): 14 min Charges:  OT General Charges $OT Visit: 1 Visit OT Evaluation $OT Eval Moderate Complexity: 1 Mod  Kiing Deakin H., OTR/L Acute Rehabilitation  [place] Feighner Elane Yolanda Bonine 08/20/2020, 5:43 PM

## 2020-08-20 NOTE — Progress Notes (Signed)
PROGRESS NOTE    Paul [name]  MBW:[id number] DOB: 08-09-1940 DOA: 08/17/2020 PCP: Denita Lung, MD    Chief Complaint  Patient presents with   Chest Pain    Brief Narrative:  CAD, pulmonary fibrosis, rheumatoid arthritis, MDs, presents with chest pain  Patient was admitted to the hospital 1 week ago with chest pain, evaluated by cardiology with low suspicion for cardiac etiology.  He has continued to have frequent episodes of chest pain throughout the day and night since his discharge on 08/09/2020, seems to be worse when he lays down, involving the upper chest and shoulders, improved with nitroglycerin and sitting upright.  He has been sleeping in a recliner for the past few days due to the symptoms  CTA chest with no change in bilateral pleural effusions and pulmonary fibrosis. High-sensitivity troponin was normal x3.  Cardiology and oncology were consulted.  Cardiology did not feel that patient's chest pain was cardiac in etiology   Subjective:  Patient is seen after returned from procedure, he is sedated HPOA sister at bedside, reports progressive decline since 11/2019 Pain control, sleep pattern changes, weight loss  Assessment & Plan:   Principal Problem:   Chest pain Active Problems:   CAD (coronary artery disease)   Hypertension complicating diabetes ([place])   Anemia of chronic disease   AAA (abdominal aortic aneurysm) ([name])-infrarenal 3.4 cm   Type 2 diabetes mellitus with diabetic neuropathy, without long-term current use of insulin ([name])   MDS/MPN (myelodysplastic/myeloproliferative neoplasms) ([name])   Chronic low back pain without sciatica   Rheumatoid arthritis ([place])   Pulmonary fibrosis ([name])   Chronic kidney disease, stage 3a ([name])   Thrombocytopenia ([name])   Leukocytosis   Chest pain, recurrent Troponin negative CTA no PE Cardiology do not think chest pain is cardiac in nature On PPI twice daily   Significant leukocytosis, oncology consulted,  patient is status post bone marrow biopsy on 7/20 MDS/anemia/thrombocytopenia, suspect post 1 unit PRBC transfusion  Hypokalemia/hypophosphatemia/AKI/CKD 3B Uric acid 5.5, though he is on Uloric Tumor lysis? Case discussed with oncology over the phone, start hydration Continue Lokelma  History of CAD Presented with chest pain, cardiology do not think chest pain is cardiac in etiology Continue Zocor, Lopressor, Imdur Aspirin and Plavix held due to bone marrow biopsy Also has anemia/thrombocytopenia  Chronic diastolic CHF Does not appear to be volume overloaded  Rheumatoid arthritis/pulmonary fibrosis Continue leflunomide, may benefit from home oxygen  Failure to thrive, progressive decline, family is interested in talking to palliative care, order placed   Body mass index is 21.95 kg/m..   .    Unresulted Labs (From admission, onward)     Start     Ordered   08/21/20 0500  Creatinine, serum  (enoxaparin (LOVENOX)    CrCl >/= 30 ml/min)  Weekly,   R     Comments: while on enoxaparin therapy    08/05/2020 2138   08/21/20 0500  Uric acid  Tomorrow morning,   R       Question:  Specimen collection method  Answer:  Lab=Lab collect   08/20/20 1123   08/20/20 1123  Phosphorus  Add-on,   AD       Question:  Specimen collection method  Answer:  Lab=Lab collect   08/20/20 1123   08/18/20 0500  CBC  Daily,   R     Question:  Specimen collection method  Answer:  Lab=Lab collect   08/17/20 1236  DVT prophylaxis: enoxaparin (LOVENOX) injection 40 mg Start: 08/25/2020 2145   Code Status: Full Family Communication: Family at bedside Disposition:   Status is: Inpatient  Dispo: The patient is from: Home              Anticipated d/c is to: To be determined              Anticipated d/c date is: To be determined                Consultants:  Cardiology Hematology oncology Palliative care Interventional radiology  Procedures:  Bone marrow  biopsy  Antimicrobials:   Anti-infectives (From admission, onward)    None           Objective: Vitals:   08/20/20 1030 08/20/20 1043 08/20/20 1058 08/20/20 1114  BP: (!) 134/56 (!) 116/51 (!) 115/46 (!) 116/46  Pulse: 78  71 69  Resp: 20 18    Temp:  (!) 97.5 F (36.4 C)    TempSrc:  Axillary    SpO2: 98%  98% 97%  Weight:      Height:        Intake/Output Summary (Last 24 hours) at 08/20/2020 1200 Last data filed at 08/19/2020 2205 Gross per 24 hour  Intake 491.67 ml  Output 300 ml  Net 191.67 ml   Filed Weights   08/18/20 0451 08/19/20 0530 08/20/20 0535  Weight: 74.8 kg 75.3 kg 69.4 kg    Examination:  General exam: Frail, chronically ill-appearing, currently sedated from procedure Respiratory system: Diminished at bases, no rales, no rhonchi, no wheezing. Respiratory effort normal. Cardiovascular system: S1 & S2 heard, RRR. No JVD, no murmur, No pedal edema. Gastrointestinal system: Abdomen is nondistended, soft and nontender.  Normal bowel sounds heard. Central nervous system: Currently sedated from procedure Extremities: No edema Skin: No rashes, lesions or ulcers Psychiatry: Seated.     Data Reviewed: I have personally reviewed following labs and imaging studies  CBC: Recent Labs  Lab 08/15/20 0241 08/16/20 0259 08/17/20 0124 08/18/20 0100 08/19/20 0141 08/20/20 0035  WBC 127.9* 129.4* 130.0* 138.2* 133.9* 134.4*  NEUTROABS 73.4* 66.1* 64.2*  --   --   --   HGB 7.8* 7.4* 7.4* 7.1* 7.2* 8.5*  HCT 22.9* 21.4* 21.6* 21.3* 21.3* 25.6*  MCV 83.0 82.6 83.4 83.9 83.2 82.6  PLT 102* 97* 98* 107* 114* 101*    Basic Metabolic Panel: Recent Labs  Lab 08/16/20 0259 08/17/20 0124 08/18/20 0100 08/19/20 0141 08/20/20 0035  NA 130* 126* 130* 130* 131*  K 5.1 4.8 5.2* 5.2* 5.6*  CL 102 100 100 102 103  CO2 '23 23 23 23 ' 21*  GLUCOSE 122* 97 68* 138* 106*  BUN 43* 45* 51* 57* 62*  CREATININE 1.52* 1.68* 1.69* 1.94* 2.08*  CALCIUM 8.2* 8.2*  8.4* 8.4* 8.3*    GFR: Estimated Creatinine Clearance: 28.7 mL/min (A) (by C-G formula based on SCr of 2.08 mg/dL (H)).  Liver Function Tests: No results for input(s): AST, ALT, ALKPHOS, BILITOT, PROT, ALBUMIN in the last 168 hours.  CBG: No results for input(s): GLUCAP in the last 168 hours.   Recent Results (from the past 240 hour(s))  SARS CORONAVIRUS 2 (TAT 6-24 HRS) Nasopharyngeal Nasopharyngeal Swab     Status: None   Collection Time: 08/09/2020  8:35 PM   Specimen: Nasopharyngeal Swab  Result Value Ref Range Status   SARS Coronavirus 2 NEGATIVE NEGATIVE Final    Comment: (NOTE) SARS-CoV-2 target nucleic acids are NOT DETECTED.  The SARS-CoV-2 RNA is generally detectable in upper and lower respiratory specimens during the acute phase of infection. Negative results do not preclude SARS-CoV-2 infection, do not rule out co-infections with other pathogens, and should not be used as the sole basis for treatment or other patient management decisions. Negative results must be combined with clinical observations, patient history, and epidemiological information. The expected result is Negative.  Fact Sheet for Patients: SugarRoll.be  Fact Sheet for Healthcare Providers: https://www.woods-mathews.com/  This test is not yet approved or cleared by the [place] FDA and  has been authorized for detection and/or diagnosis of SARS-CoV-2 by FDA under an Emergency Use Authorization (EUA). This EUA will remain  in effect (meaning this test can be used) for the duration of the COVID-19 declaration under Se ction 564(b)(1) of the Act, 21 U.S.C. section 360bbb-3(b)(1), unless the authorization is terminated or revoked sooner.  Performed at [facility], [place] [address] [place], [place] [address]          Radiology Studies: CT BONE MARROW BIOPSY & ASPIRATION  Result Date: 08/20/2020 CLINICAL DATA:  Leukocytosis. EXAM: CT GUIDED  BONE MARROW ASPIRATION AND BIOPSY ANESTHESIA/SEDATION: Versed 1.5 mg IV, Fentanyl 50 mcg IV Total Moderate Sedation Time:   21 minutes. The patient's level of consciousness and physiologic status were continuously monitored during the procedure by Radiology nursing. PROCEDURE: The procedure risks, benefits, and alternatives were explained to the patient. Questions regarding the procedure were encouraged and answered. The patient understands and consents to the procedure. A time out was performed prior to initiating the procedure. The right gluteal region was prepped with chlorhexidine. Sterile gown and sterile gloves were used for the procedure. Local anesthesia was provided with 1% Lidocaine. Under CT guidance, an 11 gauge On Control bone cutting needle was advanced from a posterior approach into the right iliac bone. Needle positioning was confirmed with CT. Initial non heparinized and heparinized aspirate samples were obtained of bone marrow. Core biopsy was performed via the On Control drill needle. COMPLICATIONS: None FINDINGS: Inspection of initial aspirate did reveal visible particles. Intact core biopsy sample was obtained. IMPRESSION: CT guided bone marrow biopsy of right posterior iliac bone with both aspirate and core samples obtained. Electronically Signed   By: Aletta Edouard M.D.   On: 08/20/2020 10:48        Scheduled Meds:  amLODipine  5 mg Oral Daily   enoxaparin (LOVENOX) injection  40 mg Subcutaneous Q24H   febuxostat  40 mg Oral Daily   fluticasone furoate-vilanterol  1 puff Inhalation Daily   isosorbide mononitrate  60 mg Oral QHS   leflunomide  20 mg Oral Daily   metoprolol tartrate  25 mg Oral BID   pantoprazole  20 mg Oral BID   simvastatin  20 mg Oral QHS   sodium zirconium cyclosilicate  10 g Oral Daily   umeclidinium bromide  1 puff Inhalation Daily   Continuous Infusions:  sodium chloride Stopped (08/19/20 0901)     LOS: 4 days   Time spent: 35 mins Greater than  50% of this time was spent in counseling, explanation of diagnosis, planning of further management, and coordination of care.   Voice Recognition Viviann Spare dictation system was used to create this note, attempts have been made to correct errors. Please contact the author with questions and/or clarifications.   Florencia Reasons, MD PhD FACP [facility]  Available via Epic secure chat 7am-7pm for nonurgent issues Please page for urgent issues To page the  attending provider between 7A-7P or the covering provider during after hours 7P-7A, please log into the web site [email or web address] and access using universal [place] password for that web site. If you do not have the password, please call the hospital operator.    08/20/2020, 12:00 PM

## 2020-08-21 ENCOUNTER — Inpatient Hospital Stay: Payer: Medicare Other | Admitting: Family Medicine

## 2020-08-21 ENCOUNTER — Inpatient Hospital Stay (HOSPITAL_COMMUNITY): Payer: Medicare Other

## 2020-08-21 DIAGNOSIS — D469 Myelodysplastic syndrome, unspecified: Secondary | ICD-10-CM | POA: Diagnosis not present

## 2020-08-21 DIAGNOSIS — N179 Acute kidney failure, unspecified: Secondary | ICD-10-CM | POA: Diagnosis not present

## 2020-08-21 DIAGNOSIS — Z515 Encounter for palliative care: Secondary | ICD-10-CM

## 2020-08-21 DIAGNOSIS — J8410 Pulmonary fibrosis, unspecified: Secondary | ICD-10-CM | POA: Diagnosis not present

## 2020-08-21 DIAGNOSIS — Z7189 Other specified counseling: Secondary | ICD-10-CM

## 2020-08-21 DIAGNOSIS — R079 Chest pain, unspecified: Secondary | ICD-10-CM

## 2020-08-21 DIAGNOSIS — E876 Hypokalemia: Secondary | ICD-10-CM | POA: Diagnosis not present

## 2020-08-21 DIAGNOSIS — N1831 Chronic kidney disease, stage 3a: Secondary | ICD-10-CM | POA: Diagnosis not present

## 2020-08-21 DIAGNOSIS — D72829 Elevated white blood cell count, unspecified: Secondary | ICD-10-CM | POA: Diagnosis not present

## 2020-08-21 LAB — OCCULT BLOOD X 1 CARD TO LAB, STOOL: Fecal Occult Bld: NEGATIVE

## 2020-08-21 LAB — CBC
HCT: 24.3 % — ABNORMAL LOW (ref 39.0–52.0)
Hemoglobin: 8.1 g/dL — ABNORMAL LOW (ref 13.0–17.0)
MCH: 27.6 pg (ref 26.0–34.0)
MCHC: 33.3 g/dL (ref 30.0–36.0)
MCV: 82.9 fL (ref 80.0–100.0)
Platelets: 85 10*3/uL — ABNORMAL LOW (ref 150–400)
RBC: 2.93 MIL/uL — ABNORMAL LOW (ref 4.22–5.81)
RDW: 21.8 % — ABNORMAL HIGH (ref 11.5–15.5)
WBC: 131.3 10*3/uL (ref 4.0–10.5)
nRBC: 0.7 % — ABNORMAL HIGH (ref 0.0–0.2)

## 2020-08-21 LAB — BASIC METABOLIC PANEL
Anion gap: 7 (ref 5–15)
BUN: 72 mg/dL — ABNORMAL HIGH (ref 8–23)
CO2: 21 mmol/L — ABNORMAL LOW (ref 22–32)
Calcium: 8.3 mg/dL — ABNORMAL LOW (ref 8.9–10.3)
Chloride: 105 mmol/L (ref 98–111)
Creatinine, Ser: 2.17 mg/dL — ABNORMAL HIGH (ref 0.61–1.24)
GFR, Estimated: 30 mL/min — ABNORMAL LOW (ref >60)
Glucose, Bld: 162 mg/dL — ABNORMAL HIGH (ref 70–99)
Potassium: 5.1 mmol/L (ref 3.5–5.1)
Sodium: 133 mmol/L — ABNORMAL LOW (ref 135–145)

## 2020-08-21 LAB — CREATININE, SERUM
Creatinine, Ser: 2.24 mg/dL — ABNORMAL HIGH (ref 0.61–1.24)
GFR, Estimated: 29 mL/min — ABNORMAL LOW (ref >60)

## 2020-08-21 LAB — URIC ACID: Uric Acid, Serum: 5.5 mg/dL (ref 3.7–8.6)

## 2020-08-21 MED ORDER — CYCLOBENZAPRINE HCL 10 MG PO TABS
5.0000 mg | ORAL_TABLET | Freq: Three times a day (TID) | ORAL | Status: DC | PRN
  Administered 2020-08-21 – 2020-08-23 (×2): 5 mg via ORAL
  Filled 2020-08-21 (×2): qty 1

## 2020-08-21 MED ORDER — LIDOCAINE 5 % EX PTCH
1.0000 | MEDICATED_PATCH | CUTANEOUS | Status: DC
  Administered 2020-08-21 – 2020-08-22 (×2): 1 via TRANSDERMAL
  Filled 2020-08-21 (×2): qty 1

## 2020-08-21 MED ORDER — ALUM & MAG HYDROXIDE-SIMETH 200-200-20 MG/5ML PO SUSP
30.0000 mL | Freq: Four times a day (QID) | ORAL | Status: DC | PRN
  Administered 2020-08-21: 30 mL via ORAL
  Filled 2020-08-21: qty 30

## 2020-08-21 MED ORDER — MORPHINE SULFATE (PF) 2 MG/ML IV SOLN
2.0000 mg | INTRAVENOUS | Status: DC | PRN
Start: 2020-08-21 — End: 2020-08-22
  Administered 2020-08-21: 2 mg via INTRAVENOUS
  Filled 2020-08-21: qty 1

## 2020-08-21 MED ORDER — MORPHINE SULFATE (PF) 4 MG/ML IV SOLN
3.0000 mg | Freq: Once | INTRAVENOUS | Status: AC
  Administered 2020-08-21: 3 mg via INTRAVENOUS
  Filled 2020-08-21: qty 1

## 2020-08-21 MED ORDER — SALINE SPRAY 0.65 % NA SOLN
1.0000 | NASAL | Status: DC | PRN
  Administered 2020-08-21: 1 via NASAL
  Filled 2020-08-21: qty 44

## 2020-08-21 MED ORDER — ENOXAPARIN SODIUM 30 MG/0.3ML IJ SOSY
30.0000 mg | PREFILLED_SYRINGE | INTRAMUSCULAR | Status: DC
  Administered 2020-08-21: 30 mg via SUBCUTANEOUS
  Filled 2020-08-21: qty 0.3

## 2020-08-21 NOTE — Progress Notes (Signed)
PROGRESS NOTE    Paul [name]  AXE:[id number] DOB: April 17, 1940 DOA: 08/09/2020 PCP: Denita Lung, MD    Chief Complaint  Patient presents with   Chest Pain    Brief Narrative:  CAD, pulmonary fibrosis, rheumatoid arthritis, MDs, presents with chest pain  Patient was admitted to the hospital 1 week ago with chest pain, evaluated by cardiology with low suspicion for cardiac etiology.  He has continued to have frequent episodes of chest pain throughout the day and night since his discharge on 08/09/2020, seems to be worse when he lays down, involving the upper chest and shoulders, improved with nitroglycerin and sitting upright.  He has been sleeping in a recliner for the past few days due to the symptoms  CTA chest with no change in bilateral pleural effusions and pulmonary fibrosis. High-sensitivity troponin was normal x3.  Cardiology and oncology were consulted.  Cardiology did not feel that patient's chest pain was cardiac in etiology   Subjective:  Patient is weak, but aaox3 today, he walked with PT, currently denies chest pain Has bilateral lower extremity edema Reports blood in stool this am, denies ab pain, no n/v HPOA sister and wife at bedside,    Assessment & Plan:   Principal Problem:   Chest pain Active Problems:   CAD (coronary artery disease)   Hypertension complicating diabetes ([place])   Anemia of chronic disease   AAA (abdominal aortic aneurysm) ([name])-infrarenal 3.4 cm   Type 2 diabetes mellitus with diabetic neuropathy, without long-term current use of insulin ([name])   MDS/MPN (myelodysplastic/myeloproliferative neoplasms) ([name])   Chronic low back pain without sciatica   Rheumatoid arthritis ([place])   Pulmonary fibrosis ([name])   Chronic kidney disease, stage 3a ([name])   Thrombocytopenia ([name])   Leukocytosis   Chest pain, recurrent Troponin negative CTA no PE Cardiology do not think chest pain is cardiac in nature On PPI twice daily Today denies chest  pain   Significant leukocytosis, oncology consulted, patient is status post bone marrow biopsy on 7/20 MDS/anemia/thrombocytopenia, suspect post 1 unit PRBC transfusion  Hypokalemia/hypophosphatemia/AKI/CKD 3B Uric acid 5.5, though he is on Uloric Tumor lysis? Case discussed with oncology over the phone, started on  hydration Continue Lokelma Slightly improved today, continue hydration/lokelma  History of CAD Presented with chest pain, cardiology do not think chest pain is cardiac in etiology Continue Zocor, Lopressor, Imdur Aspirin and Plavix held due to bone marrow biopsy Also has anemia/thrombocytopenia  Chronic diastolic CHF Does has bilateral lower extremity edema, currently on gentle hydration due to aki, will get venous doppler to rule out dvt Close monitor volume status  Rheumatoid arthritis/pulmonary fibrosis Continue leflunomide, may benefit from home oxygen  Failure to thrive, progressive decline, palliative care consulted, input appreciated    Body mass index is 24.61 kg/m..   .    Unresulted Labs (From admission, onward)     Start     Ordered   08/22/20 1103  Basic metabolic panel  Daily,   R     Question:  Specimen collection method  Answer:  Lab=Lab collect   08/21/20 0755   08/21/20 0500  Creatinine, serum  (enoxaparin (LOVENOX)    CrCl >/= 30 ml/min)  Weekly,   R     Comments: while on enoxaparin therapy    08/20/2020 2138   08/18/20 0500  CBC  Daily,   R     Question:  Specimen collection method  Answer:  Lab=Lab collect   08/17/20 1236  DVT prophylaxis: enoxaparin (LOVENOX) injection 30 mg Start: 08/21/20 2145   Code Status: Full Family Communication: Family at bedside Disposition:   Status is: Inpatient  Dispo: The patient is from: Home              Anticipated d/c is to: To be determined              Anticipated d/c date is: To be determined                Consultants:  Cardiology Hematology oncology Palliative  care Interventional radiology  Procedures:  Bone marrow biopsy  Antimicrobials:   Anti-infectives (From admission, onward)    None           Objective: Vitals:   08/21/20 0808 08/21/20 0810 08/21/20 1135 08/21/20 1202  BP:   (!) 127/45   Pulse:   63 60  Resp:   18   Temp:   (!) 97.3 F (36.3 C)   TempSrc:   Oral   SpO2: 98% 98% 100% 99%  Weight:      Height:        Intake/Output Summary (Last 24 hours) at 08/21/2020 1240 Last data filed at 08/20/2020 2157 Gross per 24 hour  Intake 873.61 ml  Output --  Net 873.61 ml   Filed Weights   08/19/20 0530 08/20/20 0535 08/21/20 0500  Weight: 75.3 kg 69.4 kg 77.8 kg    Examination:  General exam: Frail, chronically ill-appearing, aaox3 Respiratory system: Diminished at bases, mild bibasilar rales, no rhonchi, no wheezing. Respiratory effort normal. Cardiovascular system: S1 & S2 heard, RRR.  Gastrointestinal system: Abdomen is nondistended, soft and nontender.  Normal bowel sounds heard. Central nervous system: aaox3 Extremities:bilateral lower extremity edema Skin: No rashes, lesions or ulcers Psychiatry: aaox3, no agitation     Data Reviewed: I have personally reviewed following labs and imaging studies  CBC: Recent Labs  Lab 08/15/20 0241 08/16/20 0259 08/17/20 0124 08/18/20 0100 08/19/20 0141 08/20/20 0035 08/21/20 0432  WBC 127.9* 129.4* 130.0* 138.2* 133.9* 134.4* 131.3*  NEUTROABS 73.4* 66.1* 64.2*  --   --   --   --   HGB 7.8* 7.4* 7.4* 7.1* 7.2* 8.5* 8.1*  HCT 22.9* 21.4* 21.6* 21.3* 21.3* 25.6* 24.3*  MCV 83.0 82.6 83.4 83.9 83.2 82.6 82.9  PLT 102* 97* 98* 107* 114* 101* 85*    Basic Metabolic Panel: Recent Labs  Lab 08/17/20 0124 08/18/20 0100 08/19/20 0141 08/20/20 0035 08/20/20 1126 08/21/20 0432 08/21/20 0811  NA 126* 130* 130* 131*  --   --  133*  K 4.8 5.2* 5.2* 5.6*  --   --  5.1  CL 100 100 102 103  --   --  105  CO2 '23 23 23 ' 21*  --   --  21*  GLUCOSE 97 68* 138* 106*   --   --  162*  BUN 45* 51* 57* 62*  --   --  72*  CREATININE 1.68* 1.69* 1.94* 2.08*  --  2.24* 2.17*  CALCIUM 8.2* 8.4* 8.4* 8.3*  --   --  8.3*  PHOS  --   --   --   --  5.5*  --   --     GFR: Estimated Creatinine Clearance: 29 mL/min (A) (by C-G formula based on SCr of 2.17 mg/dL (H)).  Liver Function Tests: No results for input(s): AST, ALT, ALKPHOS, BILITOT, PROT, ALBUMIN in the last 168 hours.  CBG: No results for input(s): GLUCAP in  the last 168 hours.   Recent Results (from the past 240 hour(s))  SARS CORONAVIRUS 2 (TAT 6-24 HRS) Nasopharyngeal Nasopharyngeal Swab     Status: None   Collection Time: 08/31/2020  8:35 PM   Specimen: Nasopharyngeal Swab  Result Value Ref Range Status   SARS Coronavirus 2 NEGATIVE NEGATIVE Final    Comment: (NOTE) SARS-CoV-2 target nucleic acids are NOT DETECTED.  The SARS-CoV-2 RNA is generally detectable in upper and lower respiratory specimens during the acute phase of infection. Negative results do not preclude SARS-CoV-2 infection, do not rule out co-infections with other pathogens, and should not be used as the sole basis for treatment or other patient management decisions. Negative results must be combined with clinical observations, patient history, and epidemiological information. The expected result is Negative.  Fact Sheet for Patients: SugarRoll.be  Fact Sheet for Healthcare Providers: https://www.woods-mathews.com/  This test is not yet approved or cleared by the [place] FDA and  has been authorized for detection and/or diagnosis of SARS-CoV-2 by FDA under an Emergency Use Authorization (EUA). This EUA will remain  in effect (meaning this test can be used) for the duration of the COVID-19 declaration under Se ction 564(b)(1) of the Act, 21 U.S.C. section 360bbb-3(b)(1), unless the authorization is terminated or revoked sooner.  Performed at [facility], [place]  [address], [place], [place] [address]          Radiology Studies: CT BONE MARROW BIOPSY & ASPIRATION  Result Date: 08/20/2020 CLINICAL DATA:  Leukocytosis. EXAM: CT GUIDED BONE MARROW ASPIRATION AND BIOPSY ANESTHESIA/SEDATION: Versed 1.5 mg IV, Fentanyl 50 mcg IV Total Moderate Sedation Time:   21 minutes. The patient's level of consciousness and physiologic status were continuously monitored during the procedure by Radiology nursing. PROCEDURE: The procedure risks, benefits, and alternatives were explained to the patient. Questions regarding the procedure were encouraged and answered. The patient understands and consents to the procedure. A time out was performed prior to initiating the procedure. The right gluteal region was prepped with chlorhexidine. Sterile gown and sterile gloves were used for the procedure. Local anesthesia was provided with 1% Lidocaine. Under CT guidance, an 11 gauge On Control bone cutting needle was advanced from a posterior approach into the right iliac bone. Needle positioning was confirmed with CT. Initial non heparinized and heparinized aspirate samples were obtained of bone marrow. Core biopsy was performed via the On Control drill needle. COMPLICATIONS: None FINDINGS: Inspection of initial aspirate did reveal visible particles. Intact core biopsy sample was obtained. IMPRESSION: CT guided bone marrow biopsy of right posterior iliac bone with both aspirate and core samples obtained. Electronically Signed   By: Aletta Edouard M.D.   On: 08/20/2020 10:48        Scheduled Meds:  amLODipine  5 mg Oral Daily   enoxaparin (LOVENOX) injection  30 mg Subcutaneous Q24H   febuxostat  40 mg Oral Daily   fluticasone furoate-vilanterol  1 puff Inhalation Daily   isosorbide mononitrate  60 mg Oral QHS   leflunomide  20 mg Oral Daily   metoprolol tartrate  12.5 mg Oral BID   pantoprazole  20 mg Oral BID   simvastatin  20 mg Oral QHS   sodium zirconium cyclosilicate  10 g Oral  Daily   umeclidinium bromide  1 puff Inhalation Daily   Continuous Infusions:  sodium chloride 75 mL/hr at 08/21/20 0400   sodium chloride       LOS: 5 days   Time spent: 33  mins Greater than 50% of this time was spent in counseling, explanation of diagnosis, planning of further management, and coordination of care.   Voice Recognition Viviann Spare dictation system was used to create this note, attempts have been made to correct errors. Please contact the author with questions and/or clarifications.   Florencia Reasons, MD PhD FACP [facility]  Available via Epic secure chat 7am-7pm for nonurgent issues Please page for urgent issues To page the attending provider between 7A-7P or the covering provider during after hours 7P-7A, please log into the web site [email or web address] and access using universal [employer] password for that web site. If you do not have the password, please call the hospital operator.    08/21/2020, 12:40 PM

## 2020-08-21 NOTE — Progress Notes (Signed)
Physical Therapy Treatment Patient Details Name: Paul [name] MRN: [id number] DOB: May 10, 1940 Today's Date: 08/21/2020    History of Present Illness [name] is a 80 y.o. male presenting to emergency department for evaluation of chest pain. CTA chest with no change in bilateral pleural effusions and pulmonary fibrosis. High-sensitivity troponin was normal x3. He was admitted to the hospital 1 week ago,  evaluated by cardiology with low suspicion for cardiac etiology, discharged 7/9. Pt with medical history significant for CAD, pulmonary fibrosis, rheumatoid arthritis, CKD 3a, hypertension, infrarenal AAA, dilation of ascending thoracic aorta, and MDS/MPN.    PT Comments    Pt tolerates ambulation of very short distances this session, without AD. Pt agrees to walk to door in room and declines walking to hall. On reaching door pt impulsively and quickly begins walking backwards to EOB to sit. Pt asking to take gait belt off and refuses further mobility. Pt agreeable to exercises at EOB. Pt may benefit from gait trial with rollator. Pt continues to demonstrate generalized weakness and deficits in balance, activity tolerance, and safety. Pt will benefit from continued acute PT to maximize independence in mobility and for strengthening.    Follow Up Recommendations  Home health PT;Supervision for mobility/OOB     Equipment Recommendations  Other (comment) (TBD)    Recommendations for Other Services       Precautions / Restrictions Precautions Precautions: Fall Restrictions Weight Bearing Restrictions: No    Mobility  Bed Mobility Overal bed mobility: Modified Independent             General bed mobility comments: HOB elevated, pt quickly sits at EOB    Transfers Overall transfer level: Needs assistance Equipment used: None Transfers: Sit to/from Stand Sit to Stand: Supervision         General transfer comment: Pt performs sit>stand transfers 3x, supervision  for safety as pt is impulsive.  Ambulation/Gait Ambulation/Gait assistance: Min guard Gait Distance (Feet): 40 Feet Assistive device: None Gait Pattern/deviations: Trunk flexed;Step-through pattern Gait velocity: reduced Gait velocity interpretation: 1.31 - 2.62 ft/sec, indicative of limited community ambulator General Gait Details: Pt agrees to ambulate to door, refuses ambulating to hall. Upon reaching door, pt impulsively and very quickly begins walking backwards towards bed to sit.   Stairs             Wheelchair Mobility    Modified Rankin (Stroke Patients Only)       Balance Overall balance assessment: Needs assistance Sitting-balance support: Feet supported Sitting balance-Leahy Scale: Good     Standing balance support: During functional activity Standing balance-Leahy Scale: Fair Standing balance comment: pt not reliant on UE support                            Cognition Arousal/Alertness: Awake/alert Behavior During Therapy: Impulsive Overall Cognitive Status: Impaired/Different from baseline Area of Impairment: Safety/judgement;Problem solving                         Safety/Judgement: Decreased awareness of deficits;Decreased awareness of safety   Problem Solving: Slow processing;Requires verbal cues General Comments: Pt quickly walks backwards to bed during session after ambulation to door. Pt reporting wanting the gait belt off of him.      Exercises General Exercises - Lower Extremity Ankle Circles/Pumps: 10 reps;Both;Seated Long Arc Quad: 10 reps;Both;Seated Hip Flexion/Marching: 10 reps;Both;Seated    General Comments General comments (skin integrity, edema, etc.): VSS  on 1-2 L [place]      Pertinent Vitals/Pain Pain Assessment: Faces Faces Pain Scale: Hurts a little bit Pain Location: chest Pain Descriptors / Indicators: Discomfort Pain Intervention(s): Limited activity within patient's tolerance;Monitored during session     Home Living                      Prior Function            PT Goals (current goals can now be found in the care plan section) Acute Rehab PT Goals Patient Stated Goal: go home Progress towards PT goals: Progressing toward goals    Frequency    Min 3X/week      PT Plan Current plan remains appropriate    Co-evaluation              AM-PAC PT "6 Clicks" Mobility   Outcome Measure  Help needed turning from your back to your side while in a flat bed without using bedrails?: None Help needed moving from lying on your back to sitting on the side of a flat bed without using bedrails?: None Help needed moving to and from a bed to a chair (including a wheelchair)?: A Little Help needed standing up from a chair using your arms (e.g., wheelchair or bedside chair)?: A Little Help needed to walk in hospital room?: A Little Help needed climbing 3-5 steps with a railing? : A Lot 6 Click Score: 19    End of Session Equipment Utilized During Treatment: Oxygen Activity Tolerance: Patient limited by fatigue;Patient limited by pain Patient left: with call bell/phone within reach;with bed alarm set;with family/visitor present;Other (comment) (EOB) Nurse Communication: Mobility status PT Visit Diagnosis: Other abnormalities of gait and mobility (R26.89);Muscle weakness (generalized) (M62.81);Unsteadiness on feet (R26.81)     Time: 9169-4503 PT Time Calculation (min) (ACUTE ONLY): 14 min  Charges:  $Gait Training: 8-22 mins                     Acute Rehab  Pager: [phone]    Garwin Brothers, SPT  08/21/2020, 12:25 PM

## 2020-08-21 NOTE — Consult Note (Addendum)
Consultation Note Date: 08/21/2020   Patient Name: Paul [name]  DOB: 10-05-1940  MRN: [id number]  Age / Sex: 80 y.o., male  PCP: Denita Lung, MD Referring Physician: Florencia Reasons, MD  Reason for Consultation: Establishing goals of care  HPI/Patient Profile: 80 y.o. male  with past medical history of CAD, pulmonary fibrosis, rheumatoid arthritis, CKD 3a, hypertension, infrarenal AAA, dilation of ascending thoracic aorta, and MDS/MPN admitted on 08/27/2020 with chest pain. Patient was admitted to the hospital 1 week ago with chest pain, evaluated by cardiology with low suspicion for cardiac etiology.  He has continued to have frequent episodes of chest pain throughout the day and night since his discharge on 08/09/2020. This admission, CTA chest with no change in bilateral pleural effusions and pulmonary fibrosis. High-sensitivity troponin was normal x3.  Cardiology and oncology were consulted.  Cardiology did not feel that patient's chest pain was cardiac in etiology. Patient with significant leukocytosis - had bone marrow biopsy 7/20. PMT consulted to discuss [place].   Clinical Assessment and Goals of Care: I have reviewed medical records including EPIC notes, labs and imaging, assessed the patient and then met with patient's sister, [name], to discuss diagnosis prognosis, GOC, EOL wishes, disposition and options.  When I entered the room, patient was sitting up eating breakfast. When I ask him how he feels he tells me "I'm still kicking, but not very high". His wife is at bedside as well. Patient's sister asks me if she can speak with me privately outside of the room. [name] and I met privately outside and had the following discussion.  I introduced Palliative Medicine as specialized medical care for people living with serious illness. It focuses on providing relief from the symptoms and stress of a serious illness. The goal is to improve quality of  life for both the patient and the family.  [name] tells me she has HCPOA and she does not want wife included in conversation. Patient has been married to wife for 6-7 years. [name] tells me she has HCPOA documentation at home and will bring it tomorrow. [name] also shares that patient had significant confusion the past few days r/t medications (oxycodone and then more confusion following bone marrow biopsy) but she does feel he is more alert and oriented today.   On her own, she brings up code status and tells me she is not sure she agrees with full code. She tells me she attempted to discuss this further with patient this morning when he seemed more alert and he seemed to indicate he wanted to remain full code.   As far as functional and nutritional status, [name] tells me that patient has declined recently over the past several months - become more weak. He is mostly sedentary but can get up to go to the bathroom. She tells me of poor appetite - at least 40 pound weight loss. She also tells me of very poor sleep at home.    We discussed patient's current illness and what it means in the larger context of patient's on-going co-morbidities.  Natural disease trajectory and expectations at EOL were discussed. [name] tells me she has been told patient may have leukemia. She has shared this with patient. She also shares her awareness of his worsening kidney function. We also discuss his chronic conditions of heart disease and pulmonary fibrosis.   I attempted to elicit values and goals of care important to the patient.  She tells me she is unsure how Mr. Kerrigan will  want to move forward with his care - he initially declined bone marrow biopsy but then later agreed.   The difference between aggressive medical intervention and comfort care was considered in light of the patient's goals of care. We discuss different scenarios. [name] worries about leukemia treatment - worries he will not be able to  tolerate.   We discussed code status again. Shared DNR/DNI status may be most appropriate understanding evidenced based poor outcomes in similar hospitalized patients, as the cause of the arrest is likely associated with chronic/terminal disease rather than a reversible acute cardio-pulmonary event. [name] agrees, but will need to speak to Mr. Briseno.   Discussed with [name] the importance of continued conversation with family and the medical providers regarding overall plan of care and treatment options, ensuring decisions are within the context of the patient's values and GOCs.    [name] feels that once they hear from oncology they will better know how to discuss [place] moving forward. We discussed plan to meet again in AM and include patient in [place] conversation. [name] is agreeable. She will also bring HCPOA documents at that time. .  Sister feels pain is well controlled with morphine. She did not like oxycodone for him - says it made him hallucinate.   Questions and concerns were addressed. The family was encouraged to call with questions or concerns.   Primary Decision Maker PATIENT  Sister [name] reports she is HCPOA - to bring documents 7/22  SUMMARY OF RECOMMENDATIONS   - ongoing [place] discussion - repeat conversation 7/22 AM with patient and sister - sister would like more info from oncology prior to [place] conversation - DNR recommended but so far patient has requested full code - sister to bring HCPOA document  Code Status/Advance Care Planning: Full code     Primary Diagnoses: Present on Admission:  Chest pain  Type 2 diabetes mellitus with diabetic neuropathy, without long-term current use of insulin ([name])  Rheumatoid arthritis ([place])  Pulmonary fibrosis ([name])  MDS/MPN (myelodysplastic/myeloproliferative neoplasms) ([place])  Chronic kidney disease, stage 3a ([place])  Hypertension complicating diabetes ([place])  Chronic low back pain without sciatica  CAD (coronary artery  disease)  Anemia of chronic disease  AAA (abdominal aortic aneurysm) ([name])-infrarenal 3.4 cm  Thrombocytopenia ([place])   I have reviewed the medical record, interviewed the patient and family, and examined the patient. The following aspects are pertinent.  Past Medical History:  Diagnosis Date   AAA (abdominal aortic aneurysm) ([name])    Abnormal LFTs    AKI (acute kidney injury) ([place])    Anemia of chronic disease    Arthritis    "all over" (12/04/2015)   Atherosclerosis of native coronary artery of native heart without angina pectoris    CAD (coronary artery disease)    with cath 09/26/08 showing severe heart disease in RCA with POBA, mild to mod diesease in circ and LAD   Chronic back pain    back problems prior to surgery    Constipation    Coronary atherosclerosis of native coronary artery    Dehydration    Diabetes mellitus    with neuropathy   Diarrhea    Elevated liver enzymes    Generalized weakness    Gout of right hand    Hiatal hernia    History of gastroesophageal reflux (GERD)    History of stomach ulcers    Hyperlipidemia    Hypertension    Metabolic acidosis    Normochromic anemia 02/23/2016   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  Thrombocytopenia ([place])    Unintentional weight loss 09/27/2017   33 lbs between 4/19 & 8/19   Vitamin B12 deficiency    Vitamin B12 deficiency    Vitamin D deficiency    Weakness    Social History   Socioeconomic History   Marital status: Married    Spouse name: Scientist, physiological   Number of children: 0   Years of education: Not on file   Highest education level: Not on file  Occupational History   Occupation: retired  Tobacco Use   Smoking status: Every Day    Packs/day: 0.50    Years: 20.00    Pack years: 10.00    Types: Cigars, Cigarettes    Start date: 1961   Smokeless tobacco: Never   Tobacco comments:    Started smoking at age 48, quit at age 18, then restarted at age 33  Vaping Use   Vaping Use: Never used  Substance and  Sexual Activity   Alcohol use: Not Currently   Drug use: No   Sexual activity: Not Currently    Partners: Female  Other Topics Concern   Not on file  Social History Narrative   Divorced, 1 child.    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    Owned roofing co. In [place]   Social Determinants of Health   Financial Resource Strain: Not on file  Food Insecurity: Not on file  Transportation Needs: Not on file  Physical Activity: Not on file  Stress: Not on file  Social Connections: Not on file   Family History  Problem Relation Age of Onset   Heart attack Mother    Cancer Mother    Heart disease Mother    Heart attack Sister    Heart disease Sister    Scheduled Meds:  amLODipine  5 mg Oral Daily   enoxaparin (LOVENOX) injection  40 mg Subcutaneous Q24H   febuxostat  40 mg Oral Daily   fluticasone furoate-vilanterol  1 puff Inhalation Daily   isosorbide mononitrate  60 mg Oral QHS   leflunomide  20 mg Oral Daily   metoprolol tartrate  12.5 mg Oral BID   pantoprazole  20 mg Oral BID   simvastatin  20 mg Oral QHS   sodium zirconium cyclosilicate  10 g Oral Daily   umeclidinium bromide  1 puff Inhalation Daily   Continuous Infusions:  sodium chloride 75 mL/hr at 08/21/20 0400   sodium chloride     PRN Meds:.acetaminophen **OR** acetaminophen, ipratropium-albuterol, morphine injection, nitroGLYCERIN, ondansetron **OR** ondansetron (ZOFRAN) IV, oxyCODONE, senna-docusate Allergies  Allergen Reactions   Allopurinol Other (See Comments)    thrombocytopenia   Metformin And Related Diarrhea   Review of Systems  Constitutional:  Positive for activity change and appetite change.  Neurological:  Positive for weakness.   Physical Exam Constitutional:      General: He is not in acute distress. Pulmonary:     Effort: Pulmonary effort is normal.  Skin:    Coloration: Skin is pale.  Neurological:     Mental Status: He is alert.    Vital Signs: BP  (!) 119/41 (BP Location: Right Arm)   Pulse 62   Temp (!) 96.8 F (36 C) (Axillary)   Resp 20   Ht '5\' 10"'  (1.778 m)   Wt 77.8 kg   SpO2 98%   BMI 24.61 kg/m  Pain Scale: 0-10 POSS *See Group Information*: 1-Acceptable,Awake and alert Pain Score: 0-No pain   SpO2: SpO2:  98 % O2 Device:SpO2: 98 % O2 Flow Rate: .O2 Flow Rate (L/min): 2 L/min  IO: Intake/output summary:  Intake/Output Summary (Last 24 hours) at 08/21/2020 1006 Last data filed at 08/20/2020 2157 Gross per 24 hour  Intake 873.61 ml  Output --  Net 873.61 ml    LBM: Last BM Date: 08/15/20 Baseline Weight: Weight: 72.6 kg Most recent weight: Weight: 77.8 kg     Palliative Assessment/Data: PPS 40%    Time Total: 85 minutes Greater than 50%  of this time was spent counseling and coordinating care related to the above assessment and plan.  Juel Burrow, DNP, AGNP-C Palliative Medicine Team [phone] Pager: [phone]

## 2020-08-21 NOTE — Progress Notes (Signed)
Paul [name]   DOB:06-Apr-1940   XU#:[id number]   VBT#:[id number]  HEM/ONC follow up note   Subjective: I saw patient around 6pm today, he complains severe chest pain.  He received IV morphine 2 mg around 4:30pm today but it did not help her much.  His sister and the wife were at the bedside.  Sister is concerned about intermittent confusion, and some hallucination from oral oxycodone.   Objective:  Vitals:   08/21/20 1839 08/21/20 1957  BP:  (!) 130/46  Pulse: 65 62  Resp:  20  Temp:  (!) 97.4 F (36.3 C)  SpO2: 99% 98%    Body mass index is 24.61 kg/m.  Intake/Output Summary (Last 24 hours) at 08/21/2020 2017 Last data filed at 08/21/2020 1630 Gross per 24 hour  Intake 1813.61 ml  Output 140 ml  Net 1673.61 ml     Sclerae unicteric  Oropharynx clear  No peripheral adenopathy  Lungs clear -- no rales or rhonchi  Heart regular rate and rhythm  Abdomen benign  MSK no focal spinal tenderness, no peripheral edema  Neuro nonfocal    CBG (last 3)  No results for input(s): GLUCAP in the last 72 hours.   Labs:  Urine Studies No results for input(s): UHGB, CRYS in the last 72 hours.  Invalid input(s): UACOL, UAPR, USPG, UPH, UTP, UGL, UKET, UBIL, UNIT, UROB, ULEU, UEPI, UWBC, URBC, UBAC, CAST, [place], [place]  Basic Metabolic Panel: Recent Labs  Lab 08/17/20 0124 08/18/20 0100 08/19/20 0141 08/20/20 0035 08/20/20 1126 08/21/20 0432 08/21/20 0811  NA 126* 130* 130* 131*  --   --  133*  K 4.8 5.2* 5.2* 5.6*  --   --  5.1  CL 100 100 102 103  --   --  105  CO2 '23 23 23 ' 21*  --   --  21*  GLUCOSE 97 68* 138* 106*  --   --  162*  BUN 45* 51* 57* 62*  --   --  72*  CREATININE 1.68* 1.69* 1.94* 2.08*  --  2.24* 2.17*  CALCIUM 8.2* 8.4* 8.4* 8.3*  --   --  8.3*  PHOS  --   --   --   --  5.5*  --   --    GFR Estimated Creatinine Clearance: 29 mL/min (A) (by C-G formula based on SCr of 2.17 mg/dL (H)). Liver Function Tests: No results for input(s): AST, ALT, ALKPHOS,  BILITOT, PROT, ALBUMIN in the last 168 hours. No results for input(s): LIPASE, AMYLASE in the last 168 hours. No results for input(s): AMMONIA in the last 168 hours. Coagulation profile No results for input(s): INR, PROTIME in the last 168 hours.  CBC: Recent Labs  Lab 08/15/20 0241 08/16/20 0259 08/17/20 0124 08/18/20 0100 08/19/20 0141 08/20/20 0035 08/21/20 0432  WBC 127.9* 129.4* 130.0* 138.2* 133.9* 134.4* 131.3*  NEUTROABS 73.4* 66.1* 64.2*  --   --   --   --   HGB 7.8* 7.4* 7.4* 7.1* 7.2* 8.5* 8.1*  HCT 22.9* 21.4* 21.6* 21.3* 21.3* 25.6* 24.3*  MCV 83.0 82.6 83.4 83.9 83.2 82.6 82.9  PLT 102* 97* 98* 107* 114* 101* 85*   Cardiac Enzymes: No results for input(s): CKTOTAL, CKMB, CKMBINDEX, TROPONINI in the last 168 hours. BNP: Invalid input(s): POCBNP CBG: No results for input(s): GLUCAP in the last 168 hours. D-Dimer No results for input(s): DDIMER in the last 72 hours. Hgb A1c No results for input(s): HGBA1C in the last 72 hours. Lipid Profile  No results for input(s): CHOL, HDL, LDLCALC, TRIG, CHOLHDL, LDLDIRECT in the last 72 hours. Thyroid function studies No results for input(s): TSH, T4TOTAL, T3FREE, THYROIDAB in the last 72 hours.  Invalid input(s): FREET3 Anemia work up No results for input(s): VITAMINB12, FOLATE, FERRITIN, TIBC, IRON, RETICCTPCT in the last 72 hours. Microbiology Recent Results (from the past 240 hour(s))  SARS CORONAVIRUS 2 (TAT 6-24 HRS) Nasopharyngeal Nasopharyngeal Swab     Status: None   Collection Time: 08/08/2020  8:35 PM   Specimen: Nasopharyngeal Swab  Result Value Ref Range Status   SARS Coronavirus 2 NEGATIVE NEGATIVE Final    Comment: (NOTE) SARS-CoV-2 target nucleic acids are NOT DETECTED.  The SARS-CoV-2 RNA is generally detectable in upper and lower respiratory specimens during the acute phase of infection. Negative results do not preclude SARS-CoV-2 infection, do not rule out co-infections with other pathogens, and  should not be used as the sole basis for treatment or other patient management decisions. Negative results must be combined with clinical observations, patient history, and epidemiological information. The expected result is Negative.  Fact Sheet for Patients: SugarRoll.be  Fact Sheet for Healthcare Providers: https://www.woods-mathews.com/  This test is not yet approved or cleared by the [place] FDA and  has been authorized for detection and/or diagnosis of SARS-CoV-2 by FDA under an Emergency Use Authorization (EUA). This EUA will remain  in effect (meaning this test can be used) for the duration of the COVID-19 declaration under Se ction 564(b)(1) of the Act, 21 U.S.C. section 360bbb-3(b)(1), unless the authorization is terminated or revoked sooner.  Performed at [facility], [place] [address] [place], [place] [address]       Studies:  CT BONE MARROW BIOPSY & ASPIRATION  Result Date: 08/20/2020 CLINICAL DATA:  Leukocytosis. EXAM: CT GUIDED BONE MARROW ASPIRATION AND BIOPSY ANESTHESIA/SEDATION: Versed 1.5 mg IV, Fentanyl 50 mcg IV Total Moderate Sedation Time:   21 minutes. The patient's level of consciousness and physiologic status were continuously monitored during the procedure by Radiology nursing. PROCEDURE: The procedure risks, benefits, and alternatives were explained to the patient. Questions regarding the procedure were encouraged and answered. The patient understands and consents to the procedure. A time out was performed prior to initiating the procedure. The right gluteal region was prepped with chlorhexidine. Sterile gown and sterile gloves were used for the procedure. Local anesthesia was provided with 1% Lidocaine. Under CT guidance, an 11 gauge On Control bone cutting needle was advanced from a posterior approach into the right iliac bone. Needle positioning was confirmed with CT. Initial non heparinized and  heparinized aspirate samples were obtained of bone marrow. Core biopsy was performed via the On Control drill needle. COMPLICATIONS: None FINDINGS: Inspection of initial aspirate did reveal visible particles. Intact core biopsy sample was obtained. IMPRESSION: CT guided bone marrow biopsy of right posterior iliac bone with both aspirate and core samples obtained. Electronically Signed   By: Aletta Edouard M.D.   On: 08/20/2020 10:48    Assessment: 80 y.o.  1.  MDS/MPN 2.  Leukocytosis 3.  Anemia 4.  Thrombocytopenia 5.  Chest pain 6.  Pulmonary fibrosis 7.  CAD 8.  Type 2 diabetes mellitus 9.  Hyponatremia, chronic 10.  CKD 11.  Tobacco dependence 12.  RA   Plan:  Patient underwent bone marrow biopsy yesterday.  I spoke with pathologist Dr. Melina Copa this afternoon.  The bone marrow smear did not show increased blasts, morphology was still consistent with MDS/MPN, CMML is on differential.  Flow cytometry, core biopsy are still pending.  Dr. Melina Copa also plan to order next generation sequencing and cytogenetics. These may take up to 2-3 weeks.   We are still not sure what the cause for severe chest pain.  Patient has been declining in the past several months, especially lately with 2 hospital admission for severe chest pain.  His Sister Dub Mikes is his Power of attorney, is very concerned about his weakness, and intermittent confusion.  We discussed his overall poor prognosis, from multiple medical comorbidities, including CHF, pulmonary fibrosis, and his MDS/MPN.  If he does have CMML, the treatment is likely chemotherapy with azacitidine, he would not be a candidate for bone marrow transplant, and this is not curable.  Patient states that he does not want chemotherapy, he actually said "I want to diet".  His sister Dub Mikes would like [place] to focus on his quality of life. Pt and his family has met palliative care team.   We discussed the CODE STATUS, I recommend DNR.  Both patient and his sister are  in agreement.  I informed Dr. Erlinda Hong today.   Appreciate palliative care's assistance, we need to work on his pain control and symptom management.  He seems to tolerate morphine better, I did give him 1 dose of 3 mg morphine for his severe pain when I saw him, and increased IV morphine to every 3 hours.  Maybe we can try oral morphine tomorrow.  I will f/u, please do not hesitate to call me if anything I can help him. Pt's two relatives are coming from [place], plan to visit him tomorrow.  I asked patient's nurse to discuss with charge nurse to accommodate that.  I discussed the above with hospitalist Dr. Erlinda Hong.    Truitt Merle, MD 08/21/2020  8:17 PM

## 2020-08-22 DIAGNOSIS — J8410 Pulmonary fibrosis, unspecified: Secondary | ICD-10-CM | POA: Diagnosis not present

## 2020-08-22 DIAGNOSIS — D469 Myelodysplastic syndrome, unspecified: Secondary | ICD-10-CM | POA: Diagnosis not present

## 2020-08-22 DIAGNOSIS — Z7189 Other specified counseling: Secondary | ICD-10-CM | POA: Diagnosis not present

## 2020-08-22 DIAGNOSIS — Z66 Do not resuscitate: Secondary | ICD-10-CM

## 2020-08-22 DIAGNOSIS — R079 Chest pain, unspecified: Secondary | ICD-10-CM | POA: Diagnosis not present

## 2020-08-22 DIAGNOSIS — Z515 Encounter for palliative care: Secondary | ICD-10-CM | POA: Diagnosis not present

## 2020-08-22 LAB — BASIC METABOLIC PANEL
Anion gap: 7 (ref 5–15)
BUN: 74 mg/dL — ABNORMAL HIGH (ref 8–23)
CO2: 20 mmol/L — ABNORMAL LOW (ref 22–32)
Calcium: 8.4 mg/dL — ABNORMAL LOW (ref 8.9–10.3)
Chloride: 105 mmol/L (ref 98–111)
Creatinine, Ser: 2.18 mg/dL — ABNORMAL HIGH (ref 0.61–1.24)
GFR, Estimated: 30 mL/min — ABNORMAL LOW (ref >60)
Glucose, Bld: 110 mg/dL — ABNORMAL HIGH (ref 70–99)
Potassium: 5.4 mmol/L — ABNORMAL HIGH (ref 3.5–5.1)
Sodium: 132 mmol/L — ABNORMAL LOW (ref 135–145)

## 2020-08-22 LAB — CBC
HCT: 26 % — ABNORMAL LOW (ref 39.0–52.0)
Hemoglobin: 8.4 g/dL — ABNORMAL LOW (ref 13.0–17.0)
MCH: 27.2 pg (ref 26.0–34.0)
MCHC: 32.3 g/dL (ref 30.0–36.0)
MCV: 84.1 fL (ref 80.0–100.0)
Platelets: 81 10*3/uL — ABNORMAL LOW (ref 150–400)
RBC: 3.09 MIL/uL — ABNORMAL LOW (ref 4.22–5.81)
RDW: 22.5 % — ABNORMAL HIGH (ref 11.5–15.5)
WBC: 153.8 10*3/uL (ref 4.0–10.5)
nRBC: 0.5 % — ABNORMAL HIGH (ref 0.0–0.2)

## 2020-08-22 MED ORDER — HYDROMORPHONE HCL 1 MG/ML PO LIQD: 1.5000 mg | ORAL | Status: DC | PRN

## 2020-08-22 MED ORDER — LORAZEPAM 2 MG/ML PO CONC: 0.5000 mg | ORAL | Status: DC | PRN

## 2020-08-22 MED ORDER — HYDROMORPHONE HCL 1 MG/ML PO LIQD
1.0000 mg | ORAL | Status: DC | PRN
Start: 2020-08-22 — End: 2020-08-23
  Administered 2020-08-22 – 2020-08-23 (×3): 1 mg via ORAL
  Filled 2020-08-22 (×3): qty 1

## 2020-08-22 MED ORDER — LORAZEPAM 2 MG/ML IJ SOLN
0.5000 mg | INTRAMUSCULAR | Status: DC | PRN
  Administered 2020-08-22 – 2020-08-23 (×4): 0.5 mg via INTRAVENOUS
  Filled 2020-08-22 (×4): qty 1

## 2020-08-22 NOTE — Progress Notes (Signed)
PROGRESS NOTE    Paul [name]  HUT:[id number] DOB: 1941/01/25 DOA: 08/25/2020 PCP: Denita Lung, MD    Chief Complaint  Patient presents with   Chest Pain    Brief Narrative:  CAD, pulmonary fibrosis, rheumatoid arthritis, MDs, presents with chest pain  Patient was admitted to the hospital 1 week ago with chest pain, evaluated by cardiology with low suspicion for cardiac etiology.  He has continued to have frequent episodes of chest pain throughout the day and night since his discharge on 08/09/2020, seems to be worse when he lays down, involving the upper chest and shoulders, improved with nitroglycerin and sitting upright.  He has been sleeping in a recliner for the past few days due to the symptoms  CTA chest with no change in bilateral pleural effusions and pulmonary fibrosis. High-sensitivity troponin was normal x3.  Cardiology and oncology were consulted.  Cardiology did not feel that patient's chest pain was cardiac in etiology   Subjective:  Patient is weak, c/o pain,  Family at bedside  , now plan to  proceed with hospice care     Assessment & Plan:   Principal Problem:   Chest pain Active Problems:   CAD (coronary artery disease)   Hypertension complicating diabetes ([place])   Anemia of chronic disease   AAA (abdominal aortic aneurysm) ([name])-infrarenal 3.4 cm   Type 2 diabetes mellitus with diabetic neuropathy, without long-term current use of insulin ([name])   MDS/MPN (myelodysplastic/myeloproliferative neoplasms) ([name])   Chronic low back pain without sciatica   Rheumatoid arthritis ([name])   Pulmonary fibrosis ([name])   Chronic kidney disease, stage 3a ([name])   Thrombocytopenia ([name])   Leukocytosis   Chest pain, recurrent Troponin negative CTA no PE Cardiology do not think chest pain is cardiac in nature On PPI twice daily Plan to transition to home hospice vs residential hospice, pain control per palliative care   Significant leukocytosis, oncology  consulted, patient is status post bone marrow biopsy on 7/20 MDS/anemia/thrombocytopenia, suspect post 1 unit PRBC transfusion Plan to transition to home hospice vs residential hospice, pain control per palliative care  Hypokalemia/hypophosphatemia/AKI/CKD 3B Uric acid 5.5, though he is on U[place] Tumor lysis? On [place] to transition to home hospice vs residential hospice, pain control per palliative care  History of CAD Presented with chest pain, cardiology do not think chest pain is cardiac in etiology Continue Zocor, Lopressor, Imdur Aspirin and Plavix held due to bone marrow biopsy Also has anemia/thrombocytopenia Plan to transition to home hospice vs residential hospice, pain control per palliative care   Chronic diastolic CHF Does has bilateral lower extremity edema, currently on gentle hydration due to aki,  venous doppler no dvt Plan to transition to home hospice vs residential hospice, pain control per palliative care  Rheumatoid arthritis/pulmonary fibrosis Continue leflunomide Plan to transition to home hospice vs residential hospice, pain control per palliative care   Failure to thrive, progressive decline, Plan to transition to home hospice vs residential hospice, pain control per palliative care    Body mass index is 24.52 kg/m..   .    Unresulted Labs (From admission, onward)    None         DVT prophylaxis: now on comfort measures    Code Status: DNR Family Communication: Family at bedside Disposition:   Status is: Inpatient  Dispo: The patient is from: Home              Anticipated d/c is to: home vs residential hospice  Consultants:  Cardiology Hematology oncology Palliative care Interventional radiology  Procedures:  Bone marrow biopsy  Antimicrobials:   Anti-infectives (From admission, onward)    None           Objective: Vitals:   08/22/20 0454 08/22/20 0532 08/22/20 0830 08/22/20  1138  BP:    (!) 114/47  Pulse:    (!) 55  Resp: '20  20 14  ' Temp: (!) 97.2 F (36.2 C)   97.6 F (36.4 C)  TempSrc: Oral   Axillary  SpO2:    98%  Weight:  77.5 kg    Height:        Intake/Output Summary (Last 24 hours) at 08/22/2020 1821 Last data filed at 08/22/2020 0175 Gross per 24 hour  Intake --  Output 250 ml  Net -250 ml   Filed Weights   08/20/20 0535 08/21/20 0500 08/22/20 0532  Weight: 69.4 kg 77.8 kg 77.5 kg    Examination:  General exam: Frail, chronically ill-appearing, aaox3 Respiratory system: Diminished at bases, mild bibasilar rales, no rhonchi, no wheezing. Respiratory effort normal. Cardiovascular system: S1 & S2 heard, RRR.  Gastrointestinal system: Abdomen is nondistended, soft and nontender.  Normal bowel sounds heard. Central nervous system: aaox3 Extremities:bilateral lower extremity edema Skin: No rashes, lesions or ulcers Psychiatry: aaox3, no agitation     Data Reviewed: I have personally reviewed following labs and imaging studies  CBC: Recent Labs  Lab 08/16/20 0259 08/17/20 0124 08/18/20 0100 08/19/20 0141 08/20/20 0035 08/21/20 0432 08/22/20 0220  WBC 129.4* 130.0* 138.2* 133.9* 134.4* 131.3* 153.8*  NEUTROABS 66.1* 64.2*  --   --   --   --   --   HGB 7.4* 7.4* 7.1* 7.2* 8.5* 8.1* 8.4*  HCT 21.4* 21.6* 21.3* 21.3* 25.6* 24.3* 26.0*  MCV 82.6 83.4 83.9 83.2 82.6 82.9 84.1  PLT 97* 98* 107* 114* 101* 85* 81*    Basic Metabolic Panel: Recent Labs  Lab 08/18/20 0100 08/19/20 0141 08/20/20 0035 08/20/20 1126 08/21/20 0432 08/21/20 0811 08/22/20 0220  NA 130* 130* 131*  --   --  133* 132*  K 5.2* 5.2* 5.6*  --   --  5.1 5.4*  CL 100 102 103  --   --  105 105  CO2 23 23 21*  --   --  21* 20*  GLUCOSE 68* 138* 106*  --   --  162* 110*  BUN 51* 57* 62*  --   --  72* 74*  CREATININE 1.69* 1.94* 2.08*  --  2.24* 2.17* 2.18*  CALCIUM 8.4* 8.4* 8.3*  --   --  8.3* 8.4*  PHOS  --   --   --  5.5*  --   --   --      GFR: Estimated Creatinine Clearance: 28.8 mL/min (A) (by C-G formula based on SCr of 2.18 mg/dL (H)).  Liver Function Tests: No results for input(s): AST, ALT, ALKPHOS, BILITOT, PROT, ALBUMIN in the last 168 hours.  CBG: No results for input(s): GLUCAP in the last 168 hours.   Recent Results (from the past 240 hour(s))  SARS CORONAVIRUS 2 (TAT 6-24 HRS) Nasopharyngeal Nasopharyngeal Swab     Status: None   Collection Time: 08/29/2020  8:35 PM   Specimen: Nasopharyngeal Swab  Result Value Ref Range Status   SARS Coronavirus 2 NEGATIVE NEGATIVE Final    Comment: (NOTE) SARS-CoV-2 target nucleic acids are NOT DETECTED.  The SARS-CoV-2 RNA is generally detectable in upper and lower respiratory specimens  during the acute phase of infection. Negative results do not preclude SARS-CoV-2 infection, do not rule out co-infections with other pathogens, and should not be used as the sole basis for treatment or other patient management decisions. Negative results must be combined with clinical observations, patient history, and epidemiological information. The expected result is Negative.  Fact Sheet for Patients: SugarRoll.be  Fact Sheet for Healthcare Providers: https://www.woods-mathews.com/  This test is not yet approved or cleared by the [place] FDA and  has been authorized for detection and/or diagnosis of SARS-CoV-2 by FDA under an Emergency Use Authorization (EUA). This EUA will remain  in effect (meaning this test can be used) for the duration of the COVID-19 declaration under Se ction 564(b)(1) of the Act, 21 U.S.C. section 360bbb-3(b)(1), unless the authorization is terminated or revoked sooner.  Performed at [facility], [place] [address], [place], [place] [address]          Radiology Studies: No results found.      Scheduled Meds:  amLODipine  5 mg Oral Daily   febuxostat  40 mg Oral Daily   fluticasone  furoate-vilanterol  1 puff Inhalation Daily   isosorbide mononitrate  60 mg Oral QHS   leflunomide  20 mg Oral Daily   lidocaine  1 patch Transdermal Q24H   metoprolol tartrate  12.5 mg Oral BID   pantoprazole  20 mg Oral BID   simvastatin  20 mg Oral QHS   sodium zirconium cyclosilicate  10 g Oral Daily   umeclidinium bromide  1 puff Inhalation Daily   Continuous Infusions:     LOS: 6 days   Time spent: 15 mins Greater than 50% of this time was spent in counseling, explanation of diagnosis, planning of further management, and coordination of care.   Voice Recognition Viviann Spare dictation system was used to create this note, attempts have been made to correct errors. Please contact the author with questions and/or clarifications.   Florencia Reasons, MD PhD FACP [facility]  Available via Epic secure chat 7am-7pm for nonurgent issues Please page for urgent issues To page the attending provider between 7A-7P or the covering provider during after hours 7P-7A, please log into the web site [email or web address] and access using universal [place] password for that web site. If you do not have the password, please call the hospital operator.    08/22/2020, 6:21 PM

## 2020-08-22 NOTE — Consult Note (Signed)
   [facility] Inpatient Consult   08/22/2020  JAZEN SPRAGGINS 1940/03/31 [id number]  [facility] [ACO] Patient: [employer]  Follow up: disposition/Palliative/Hospice  Chart reviewed and reveals the patient is currently transitioning to Hospice/Palliative Care.   Patient will be followed by [facility] is currently listed per inpatient [facility] team notes.  Given this choice patient will have full case management services through Hospice and needs will be met at the hospice level of care. Will sign off from following at transition.  Natividad Brood, RN BSN [facility] Liaison  [phone] business mobile phone Toll free office [phone]  Fax number: [phone] [email or web address]

## 2020-08-22 NOTE — TOC Initial Note (Signed)
Transition of Care [facility]) - Initial/Assessment Note    Patient Details  Name: Paul [name] MRN: [id number] Date of Birth: 1940/10/08  Transition of Care [facility]) CM/SW Contact:    Paul Roys, RN Phone Number: 08/22/2020, 3:38 PM  Clinical Narrative:  Risk for readmission assessment completed. Case Manager received a secure chat from the Palliative Nurse Practitioner recommending home with hospice services. Patient is currently at home with his spouse Paul [name]. Sister Paul [name] is making the decisions @ [phone]. Family is agreeable to hospice services with [facility]. Referral made to [place] with [facility]. Family is agreeable to having a hospital bed and oxygen delivered to the home. Sister states she will see if the patient needs an overbed table once he transitions home. Family wants durable medical equipment delivered to the home on Saturday morning. Please call Paul [name] to make sure DME has arrived prior to calling [facility]. Face Sheet and Medical Necessity will be on the [place] chart along with the signed DNR. No further needs from Case Manager at this time.          Expected Discharge Plan: Home w Hospice Care Barriers to Discharge: No Barriers Identified   Patient Goals and CMS Choice Patient states their goals for this hospitalization and ongoing recovery are:: to return home with Hospice CMS Medicare.gov Compare Post Acute Care list provided to:: Patient Choice offered to / list presented to : Sibling, Patient  Expected Discharge Plan and Services Expected Discharge Plan: Home w Hospice Care In-house Referral: Hospice / Palliative Care Discharge Planning Services: CM Consult Post Acute Care Choice: Hospice Living arrangements for the past 2 months: Single Family Home                 DME Arranged:  (Hospice agency to order HB and 02.) DME Agency: Other - Comment       HH Arranged: RN [place] Agency: Hospice and [place] ([place] Date [facility] Agency Contacted: 08/22/20 Time HH Agency Contacted: 1100 Representative spoke with at [place]: [place]  Prior Living Arrangements/Services Living arrangements for the past 2 months: [place] with:: Spouse Patient language and need for interpreter reviewed:: Yes Do you feel safe going back to the place where you live?: Yes      Need for Family Participation in Patient Care: Yes (Comment) Care giver support system in place?: Yes (comment) Current home services: DME Criminal Activity/Legal Involvement Pertinent to Current Situation/Hospitalization: No - Comment as needed  Activities of Daily Living Home Assistive Devices/Equipment: None ADL Screening (condition at time of admission) Patient's cognitive ability adequate to safely complete daily activities?: Yes Is the patient deaf or have difficulty hearing?: No Does the patient have difficulty seeing, even when wearing glasses/contacts?: No Does the patient have difficulty concentrating, remembering, or making decisions?: No Patient able to express need for assistance with ADLs?: Yes Does the patient have difficulty dressing or bathing?: No Independently performs ADLs?: Yes (appropriate for developmental age) Does the patient have difficulty walking or climbing stairs?: No Weakness of Legs: Both Weakness of Arms/Hands: None  Permission Sought/Granted Permission sought to share information with : Family Supports, Case Manager Permission granted to share information with : Yes, Verbal Permission Granted  Share Information with NAME: Sister Paul [name] @ [phone]  Permission granted to share info w AGENCY: [place]        Emotional Assessment   Attitude/Demeanor/Rapport: Unable to Assess Affect (typically observed): Unable to Assess  Alcohol / Substance Use: Not Applicable Psych Involvement: No (comment)  Admission diagnosis:  Peripheral edema  [R60.9] Chest pain [R07.9] Chest pain, unspecified type [R07.9] Patient Active Problem List   Diagnosis Date Noted   Leukocytosis    Chest pain 08/16/2020   Thrombocytopenia ([place]) 08/21/2020   Peripheral edema    Chronic kidney disease, stage 3a ([place]) 08/07/2020   Chronic low back pain without sciatica 12/10/2019   Rheumatoid arthritis ([place]) 12/10/2019   Pulmonary fibrosis ([place]) 12/10/2019   Aortic atherosclerosis ([place]) 12/10/2019   CAP (community acquired pneumonia) 12/01/2019   Hyponatremia 12/01/2019   Onychomycosis 03/13/2019   History of amputation of great toe ([place]) 03/08/2019   Current smoker 01/17/2019   MDS/MPN (myelodysplastic/myeloproliferative neoplasms) ([place]) 01/02/2018   Unintentional weight loss 09/27/2017   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   Hyperlipidemia associated with type 2 diabetes mellitus ([place]) 01/21/2017   Type 2 diabetes mellitus with diabetic neuropathy, without long-term current use of insulin ([place]) 01/21/2017   Diabetic nephropathy associated with type 2 diabetes mellitus ([place]) 01/21/2017   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   Shortness of breath    Thrombocytopenia due to drugs    Allergy status to unspecified drugs, medicaments and biological substances status    AAA (abdominal aortic aneurysm) ([name])-infrarenal 3.4 cm 12/04/2015   Vitamin B12 deficiency 04/22/2015   Anemia of chronic disease 04/18/2015   Hypertension complicating diabetes ([place]) 07/22/2010   CAD (coronary artery disease) 09/17/2008   PCP:  Denita Lung, MD Pharmacy:   [facility] [place], [place] [address] [facility] [address] [place] [address] Phone: [phone] Fax: [phone]  [facility]  [facility]) - [place], [place] - [address] [place] [address] Phone: [phone] Fax: [phone]    Readmission Risk Interventions Readmission Risk  Prevention Plan 08/22/2020 08/09/2020  Transportation Screening Complete Complete  PCP or Specialist Appt within 3-5 Days Complete -  HRI or Home Care Consult Complete Complete  Social Work Consult for [place] Planning/Counseling Complete Complete  Palliative Care Screening Complete Not Applicable  Medication Review Press photographer) Complete Complete  Some recent data might be hidden

## 2020-08-22 NOTE — Progress Notes (Signed)
Daily Progress Note   Patient Name: Paul [name]       Date: 08/22/2020 DOB: [birth year]  Age: 80 y.o. MRN#: [id number] Attending Physician: Florencia Reasons, MD Primary Care Physician: Denita Lung, MD Admit Date: 08/20/2020  Reason for Consultation/Follow-up: Establishing goals of care  Subjective: Not feeling well today but pain controlled  Length of Stay: 6  Current Medications: Scheduled Meds:   amLODipine  5 mg Oral Daily   febuxostat  40 mg Oral Daily   fluticasone furoate-vilanterol  1 puff Inhalation Daily   isosorbide mononitrate  60 mg Oral QHS   leflunomide  20 mg Oral Daily   lidocaine  1 patch Transdermal Q24H   metoprolol tartrate  12.5 mg Oral BID   pantoprazole  20 mg Oral BID   simvastatin  20 mg Oral QHS   sodium zirconium cyclosilicate  10 g Oral Daily   umeclidinium bromide  1 puff Inhalation Daily    Continuous Infusions:   PRN Meds: acetaminophen **OR** acetaminophen, alum & mag hydroxide-simeth, cyclobenzaprine, HYDROmorphone HCl, ipratropium-albuterol, LORazepam, LORazepam, nitroGLYCERIN, ondansetron **OR** ondansetron (ZOFRAN) IV, senna-docusate, sodium chloride  Physical Exam Constitutional:      General: Paul [name] is not in acute distress.    Comments: lethargic  Pulmonary:     Effort: Pulmonary effort is normal.  Skin:    General: Skin is warm and dry.  Neurological:     Comments: Some confusion            Vital Signs: BP (!) 114/47 (BP Location: Right Arm)   Pulse (!) 55   Temp 97.6 F (36.4 C) (Axillary)   Resp 14   Ht '5\' 10"'  (1.778 m)   Wt 77.5 kg   SpO2 98%   BMI 24.52 kg/m  SpO2: SpO2: 98 % O2 Device: O2 Device: Nasal Cannula O2 Flow Rate: O2 Flow Rate (L/min): 2 L/min  Intake/output summary:  Intake/Output Summary (Last 24 hours) at 08/22/2020  1301 Last data filed at 08/22/2020 3358 Gross per 24 hour  Intake 940 ml  Output 390 ml  Net 550 ml   LBM: Last BM Date: 08/20/20 Baseline Weight: Weight: 72.6 kg Most recent weight: Weight: 77.5 kg       Palliative Assessment/Data: PPS 40%    Flowsheet Rows    Flowsheet Row Most Recent Value  Intake Tab   Referral Department Hospitalist  Unit at Time of Referral Cardiac/Telemetry Unit  Palliative Care Primary Diagnosis Cancer  Date Notified 08/20/20  Palliative Care Type New Palliative care  Reason for referral Clarify Goals of Care  Date of Admission 08/28/2020  Date first seen by Palliative Care 08/21/20  # of days Palliative referral response time 1 Day(s)  # of days IP prior to Palliative referral 6  Clinical Assessment   Psychosocial & Spiritual Assessment   Palliative Care Outcomes        Patient Active Problem List   Diagnosis Date Noted   Leukocytosis    Chest pain 08/28/2020   Thrombocytopenia ([place]) 08/18/2020   Peripheral edema    Chronic kidney disease, stage 3a ([place]) 08/07/2020   Chronic low back pain without sciatica 12/10/2019   Rheumatoid arthritis ([place]) 12/10/2019   Pulmonary  fibrosis ([place]) 12/10/2019   Aortic atherosclerosis ([place]) 12/10/2019   CAP (community acquired pneumonia) 12/01/2019   Hyponatremia 12/01/2019   Onychomycosis 03/13/2019   History of amputation of great toe ([place]) 03/08/2019   Current smoker 01/17/2019   MDS/MPN (myelodysplastic/myeloproliferative neoplasms) ([place]) 01/02/2018   Unintentional weight loss 09/27/2017   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   Hyperlipidemia associated with type 2 diabetes mellitus ([place]) 01/21/2017   Type 2 diabetes mellitus with diabetic neuropathy, without long-term current use of insulin ([place]) 01/21/2017   Diabetic nephropathy associated with type 2 diabetes mellitus ([place]) 01/21/2017   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 male  with past medical history of CAD, pulmonary fibrosis, rheumatoid arthritis, CKD 3a, hypertension, infrarenal AAA, dilation of ascending thoracic aorta, and MDS/MPN admitted on 08/29/2020 with chest pain. Patient was admitted to the hospital 1 week ago with chest pain, evaluated by cardiology with low suspicion for cardiac etiology.  Paul [name] has continued to have frequent episodes of chest pain throughout the day and night since his discharge on 08/09/2020. This admission, CTA chest with no change in bilateral pleural effusions and pulmonary fibrosis. High-sensitivity troponin was normal x3.  Cardiology and oncology were consulted.  Cardiology did not feel that patient's chest pain was cardiac in etiology. Patient with significant leukocytosis - had bone marrow biopsy 7/20. PMT consulted to discuss [place].  Assessment: Family meeting today to include patient, his sister and Tawny Hopping, and nephew Shanon Brow.   At beginning of meeting [name] shows me patient's living will declaring desire for a natural death and shows me HCPOA documentation naming [name] as HCPOA. These were copied to be scanned to patient's chart.   We review situation and patient's condition - family has good understanding following conversation with oncology yesterday.   Family shares that they discussed situation yesterday evening and all agree they would like to transition to focus on comfort and quality of life - no interest in any aggressive medical care or repeated hospitalizations.   We discuss hospice care at home - discussed philosophy of hospice and type of support  provided. They are familiar with hospice and are interested in receiving hospice support at home. They prefer [facility].   We discuss code status - all agree to DNR.   We discuss symptom management - we discuss some risk of continuing morphine d/t patient's AKI. Family shares they have noticed some new jerking in patient's upper extremities - we discuss this could be related to his kidneys not clearing morphine well. We discuss transition to dilaudid and they agree. Will try sublingual dilaudid.   Sister called me back later to tell me she and patient had discussed care further and Paul [name] told her Paul [name] only wants to be comfortable and is ready to die. She tells me to transition his care to focus solely on his comfort.   They are hopeful for discharge home tomorrow.   Recommendations/Plan: Home with hospice tomorrow Dc morphine - patient with myoclonus - initiate sublingual dilaudid Code status to DNR HCPOA document and living will copied to be scanned into  Vynca - confirmed sister is HCPOA  Goals of Care and Additional Recommendations: Limitations on Scope of Treatment: Full Comfort Care and No Chemotherapy  Code Status: DNR  Prognosis:  Weeks-months  Discharge Planning: Home with Hospice  Care plan was discussed with patient, family, TOC, Dr Erlinda Hong, hospice liaison  Thank you for allowing the Palliative Medicine Team to assist in the care of this patient.   Total Time 50 minutes Prolonged Time Billed  no       Greater than 50%  of this time was spent counseling and coordinating care related to the above assessment and plan.  Juel Burrow, DNP, [facility] Inpatient Palliative Medicine Team Team Phone # [phone]  Pager [phone]

## 2020-08-22 NOTE — Progress Notes (Addendum)
Paul [name]   DOB:12-12-1940   ZO#:[id number]   JWJ#:[id number]  HEM/ONC follow up note   Subjective: The patient was seen and chart was reviewed.  The patient and his family met with palliative care earlier today and he has now transition to comfort measures.  Plan is for the patient to discharge to home with hospice.  Family anticipates discharge over the weekend.  Less alert at the time of my visit, but he is having chest pain.  Still having intermittent confusion.  Objective:  Vitals:   08/22/20 0454 08/22/20 0830  BP:    Pulse:    Resp: 20 20  Temp: (!) 97.2 F (36.2 C)   SpO2:      Body mass index is 24.52 kg/m.  Intake/Output Summary (Last 24 hours) at 08/22/2020 0927 Last data filed at 08/22/2020 0742 Gross per 24 hour  Intake 940 ml  Output 390 ml  Net 550 ml     Sclerae unicteric  Oropharynx clear  No peripheral adenopathy  Lungs clear -- no rales or rhonchi  Heart regular rate and rhythm  Abdomen benign  MSK no focal spinal tenderness, no peripheral edema  Neuro nonfocal    CBG (last 3)  No results for input(s): GLUCAP in the last 72 hours.   Labs:  Urine Studies No results for input(s): UHGB, CRYS in the last 72 hours.  Invalid input(s): UACOL, UAPR, USPG, UPH, UTP, UGL, UKET, UBIL, UNIT, UROB, ULEU, UEPI, UWBC, URBC, UBAC, CAST, [place], [place]  Basic Metabolic Panel: Recent Labs  Lab 08/18/20 0100 08/19/20 0141 08/20/20 0035 08/20/20 1126 08/21/20 0432 08/21/20 0811 08/22/20 0220  NA 130* 130* 131*  --   --  133* 132*  K 5.2* 5.2* 5.6*  --   --  5.1 5.4*  CL 100 102 103  --   --  105 105  CO2 23 23 21*  --   --  21* 20*  GLUCOSE 68* 138* 106*  --   --  162* 110*  BUN 51* 57* 62*  --   --  72* 74*  CREATININE 1.69* 1.94* 2.08*  --  2.24* 2.17* 2.18*  CALCIUM 8.4* 8.4* 8.3*  --   --  8.3* 8.4*  PHOS  --   --   --  5.5*  --   --   --    GFR Estimated Creatinine Clearance: 28.8 mL/min (A) (by C-G formula based on SCr of 2.18 mg/dL (H)). Liver  Function Tests: No results for input(s): AST, ALT, ALKPHOS, BILITOT, PROT, ALBUMIN in the last 168 hours. No results for input(s): LIPASE, AMYLASE in the last 168 hours. No results for input(s): AMMONIA in the last 168 hours. Coagulation profile No results for input(s): INR, PROTIME in the last 168 hours.  CBC: Recent Labs  Lab 08/16/20 0259 08/17/20 0124 08/18/20 0100 08/19/20 0141 08/20/20 0035 08/21/20 0432 08/22/20 0220  WBC 129.4* 130.0* 138.2* 133.9* 134.4* 131.3* 153.8*  NEUTROABS 66.1* 64.2*  --   --   --   --   --   HGB 7.4* 7.4* 7.1* 7.2* 8.5* 8.1* 8.4*  HCT 21.4* 21.6* 21.3* 21.3* 25.6* 24.3* 26.0*  MCV 82.6 83.4 83.9 83.2 82.6 82.9 84.1  PLT 97* 98* 107* 114* 101* 85* 81*   Cardiac Enzymes: No results for input(s): CKTOTAL, CKMB, CKMBINDEX, TROPONINI in the last 168 hours. BNP: Invalid input(s): POCBNP CBG: No results for input(s): GLUCAP in the last 168 hours. D-Dimer No results for input(s): DDIMER in the last  72 hours. Hgb A1c No results for input(s): HGBA1C in the last 72 hours. Lipid Profile No results for input(s): CHOL, HDL, LDLCALC, TRIG, CHOLHDL, LDLDIRECT in the last 72 hours. Thyroid function studies No results for input(s): TSH, T4TOTAL, T3FREE, THYROIDAB in the last 72 hours.  Invalid input(s): FREET3 Anemia work up No results for input(s): VITAMINB12, FOLATE, FERRITIN, TIBC, IRON, RETICCTPCT in the last 72 hours. Microbiology Recent Results (from the past 240 hour(s))  SARS CORONAVIRUS 2 (TAT 6-24 HRS) Nasopharyngeal Nasopharyngeal Swab     Status: None   Collection Time: 08/06/2020  8:35 PM   Specimen: Nasopharyngeal Swab  Result Value Ref Range Status   SARS Coronavirus 2 NEGATIVE NEGATIVE Final    Comment: (NOTE) SARS-CoV-2 target nucleic acids are NOT DETECTED.  The SARS-CoV-2 RNA is generally detectable in upper and lower respiratory specimens during the acute phase of infection. Negative results do not preclude SARS-CoV-2 infection,  do not rule out co-infections with other pathogens, and should not be used as the sole basis for treatment or other patient management decisions. Negative results must be combined with clinical observations, patient history, and epidemiological information. The expected result is Negative.  Fact Sheet for Patients: SugarRoll.be  Fact Sheet for Healthcare Providers: https://www.woods-mathews.com/  This test is not yet approved or cleared by the [place] FDA and  has been authorized for detection and/or diagnosis of SARS-CoV-2 by FDA under an Emergency Use Authorization (EUA). This EUA will remain  in effect (meaning this test can be used) for the duration of the COVID-19 declaration under Se ction 564(b)(1) of the Act, 21 U.S.C. section 360bbb-3(b)(1), unless the authorization is terminated or revoked sooner.  Performed at [facility], [place] [address], [place], [place] [address]       Studies:  CT BONE MARROW BIOPSY & ASPIRATION  Result Date: 08/20/2020 CLINICAL DATA:  Leukocytosis. EXAM: CT GUIDED BONE MARROW ASPIRATION AND BIOPSY ANESTHESIA/SEDATION: Versed 1.5 mg IV, Fentanyl 50 mcg IV Total Moderate Sedation Time:   21 minutes. The patient's level of consciousness and physiologic status were continuously monitored during the procedure by Radiology nursing. PROCEDURE: The procedure risks, benefits, and alternatives were explained to the patient. Questions regarding the procedure were encouraged and answered. The patient understands and consents to the procedure. A time out was performed prior to initiating the procedure. The right gluteal region was prepped with chlorhexidine. Sterile gown and sterile gloves were used for the procedure. Local anesthesia was provided with 1% Lidocaine. Under CT guidance, an 11 gauge On Control bone cutting needle was advanced from a posterior approach into the right iliac bone. Needle positioning was  confirmed with CT. Initial non heparinized and heparinized aspirate samples were obtained of bone marrow. Core biopsy was performed via the On Control drill needle. COMPLICATIONS: None FINDINGS: Inspection of initial aspirate did reveal visible particles. Intact core biopsy sample was obtained. IMPRESSION: CT guided bone marrow biopsy of right posterior iliac bone with both aspirate and core samples obtained. Electronically Signed   By: Aletta Edouard M.D.   On: 08/20/2020 10:48    Assessment: 80 y.o.  1.  MDS/MPN 2.  Leukocytosis 3.  Anemia 4.  Thrombocytopenia 5.  Chest pain 6.  Pulmonary fibrosis 7.  CAD 8.  Type 2 diabetes mellitus 9.  Hyponatremia, chronic 10.  CKD 11.  Tobacco dependence 12.  RA   Plan:  Patient underwent bone marrow biopsy earlier this week..  Preliminary bone marrow smear did not show increased blasts, morphology  was still consistent with MDS/MPN, CMML is on differential.  Flow cytometry, core biopsy are still pending.  These results were discussed with the patient yesterday and he told us that he does not want chemotherapy and wished to focus on comfort and quality of life.  The patient and his family have met with the palliative care team and have opted to pursue comfort measures and to discharge to home with hospice.  CODE STATUS changed to DNR.  Appreciate palliative care's assistance.  They are trying him on liquid Dilaudid to see how he tolerates.  Family advised to let us know if any concerning symptoms and we can adjust medication as needed.  Paul Bussing, NP 08/22/2020  9:27 AM  Addendum  I have reviewed the above documentation for accuracy and completeness, and I agree with the above.  Pt is declining rapidly. He did not eat anything, and his pain is not controlled. He sits at edge of the bed, with his wife holding his back. Very lethargic. Sister is concerned if they can take care of him at home with home hospice. I discussed the option of  residential hospice, and showed her the location and some picture of [place]. I think pt's life expectancy is likely less than 2 weeks based on today's evaluation, and he qualifies residential hospice. I will reach out to palliative care and hospitalist team about this.  Paul [name]  08/22/2020

## 2020-08-22 NOTE — Progress Notes (Signed)
This chaplain responded the family's request through the unit for prayer.    The Pt. Is sitting up on the bedside. The family shared the Pt. recently received pain medicine. The Pt. is joined by the Pt. wife, sister, nephew, and great niece.   The chaplain shared prayer with the Pt. and family. The family indicated F/U spiritual care may be requested.

## 2020-08-22 NOTE — Progress Notes (Addendum)
[place] 7I22: Manufacturing engineer [facility]) Hospital Liaison: RN note     3:25pm: Pt has been deemed eligible for hospice services by [facility] MD.  Admission assessment visit has be scheduled for 11am on "Sunday in anticipation of Saturday discharge.  Please call [facility] liaisons with any questions at [phone].   Notified by Transition of Care Manger of patient/family request for [facility] services at home after discharge. Chart and patient information under review by [facility] physician. Hospice eligibility pending currently.     Writer spoke with pt's sister and HCPOA Frances to initiate education related to hospice philosophy, services and team approach to care.  Frnaces     verbalized understanding of information given. Per discussion, plan is for discharge home tomorrow once DME is in place.  Please send signed and completed DNR form home with patient/family.   Please provide prescriptions for mediations at discharge to ensure comfort until patient can be admitted onto hospice services.   DME needs have been discussed.  Patient/family requests the following DME for delivery to the home:  hospital bed and supplemental O2.  Family asks to have DME delivered tomorrow to allow time to arrange furniture and to make space. [facility] equipment manager has been notified and will contact DME provider to arrange delivery to the home. Home address has been verified and is correct in the chart.      Frances is the family member to contact to arrange time of delivery.      [facility] Referral Center aware of the above. Please notify [facility] when patient is ready to leave the unit at discharge. (Call [phone] or [phone] after 5pm.)  [facility] information and contact numbers given to pt's sister Frances.       Please do not hesitate to call with questions.   Thank you for the opportunity to participate in this patient's care.  Chrislyn King, BSN, RN       [facility] Hospital Liaison (listed on AMION under Hospice [facility])     33" 6-  [phone]  [phone] (24h on call)

## 2020-08-22 NOTE — Progress Notes (Signed)
PT Cancellation Note  Patient Details Name: Paul [name] MRN: [id number] DOB: 11-01-1940   Cancelled Treatment:    Reason Eval/Treat Not Completed: Fatigue/lethargy limiting ability to participate. Patient's family member asked that I not wake patient as he apparently just went to sleep. Will re-attempt later today if time allows.      Sacora Hawbaker 08/22/2020, 11:32 AM

## 2020-08-22 NOTE — Progress Notes (Signed)
PROGRESS NOTE    Paul [name]  FMB:[id number] DOB: [birth year] DOA: 08/24/2020 PCP: Denita Lung, MD    Chief Complaint  Patient presents with   Chest Pain    Brief Narrative:  CAD, pulmonary fibrosis, rheumatoid arthritis, MDs, presents with chest pain  Patient was admitted to the hospital 1 week ago with chest pain, evaluated by cardiology with low suspicion for cardiac etiology.  He has continued to have frequent episodes of chest pain throughout the day and night since his discharge on 08/09/2020, seems to be worse when he lays down, involving the upper chest and shoulders, improved with nitroglycerin and sitting upright.  He has been sleeping in a recliner for the past few days due to the symptoms  CTA chest with no change in bilateral pleural effusions and pulmonary fibrosis. High-sensitivity troponin was normal x3.  Cardiology and oncology were consulted.  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   Type 2 diabetes mellitus with diabetic neuropathy, without long-term current use of insulin ([name])   MDS/MPN (myelodysplastic/myeloproliferative neoplasms) ([name])   Chronic low back pain without sciatica   Rheumatoid arthritis ([name])   Pulmonary fibrosis ([name])   Chronic kidney disease, stage 3a ([name])   Thrombocytopenia ([name])   Leukocytosis   Chest pain, recurrent Troponin negative CTA no PE Cardiology do not think chest pain is cardiac in nature On PPI twice daily Plan to transition to home hospice tomorrow, pain control per palliative care   Significant leukocytosis, oncology consulted, patient is status post bone marrow biopsy on  7/20 MDS/anemia/thrombocytopenia, suspect post 1 unit PRBC transfusion Plan to transition to home hospice tomorrow, pain control per palliative care  Hypokalemia/hypophosphatemia/AKI/CKD 3B Uric acid 5.5, though he is on Uloric Tumor lysis? On [place] to transition to home hospice tomorrow, pain control per palliative care  History of CAD Presented with chest pain, cardiology do not think chest pain is cardiac in etiology Continue Zocor, Lopressor, Imdur Aspirin and Plavix held due to bone marrow biopsy Also has anemia/thrombocytopenia Plan to transition to home hospice tomorrow, pain control per palliative care  Chronic diastolic CHF Does has bilateral lower extremity edema, currently on gentle hydration due to aki,  venous doppler no dvt Plan to transition to home hospice tomorrow, pain control per palliative care  Rheumatoid arthritis/pulmonary fibrosis Continue leflunomide Plan to transition to home hospice tomorrow, pain control per palliative care  Failure to thrive, progressive decline, Plan to transition to home hospice tomorrow, pain control per palliative care   Body mass index is 24.52 kg/m..   .    Unresulted Labs (From admission, onward)     Start     Ordered   08/22/20 7017  Basic metabolic panel  Daily,   R     Question:  Specimen collection method  Answer:  Lab=Lab collect   08/21/20 0755   08/21/20 0500  Creatinine, serum  (enoxaparin (LOVENOX)    CrCl >/= 30 ml/min)  Weekly,   R     Comments: while on enoxaparin therapy    08/09/2020 2138  DVT prophylaxis: enoxaparin (LOVENOX) injection 30 mg Start: 08/21/20 2145   Code Status: DNR Family Communication: Family at bedside Disposition:   Status is: Inpatient  Dispo: The patient is from: Home              Anticipated d/c is to: home with home hospice tomorrow                             Consultants:  Cardiology Hematology oncology Palliative care Interventional  radiology  Procedures:  Bone marrow biopsy  Antimicrobials:   Anti-infectives (From admission, onward)    None           Objective: Vitals:   08/21/20 1957 08/22/20 0454 08/22/20 0532 08/22/20 0830  BP: (!) 130/46     Pulse: 62     Resp: _0 Temp: (!) 97.4 F (36.3 C) (!) 97.2 F (36.2 C)    TempSrc: Oral Oral    SpO2: 98%     Weight:   77.5 kg   Height:        Intake/Output Summary (Last 24 hours) at 08/22/2020 1053 Last data filed at 08/22/2020 0742 Gross per 24 hour  Intake 940 ml  Output 390 ml  Net 550 ml   Filed Weights   08/20/20 0535 08/21/20 0500 08/22/20 0532  Weight: 69.4 kg 77.8 kg 77.5 kg    Examination:  General exam: Frail, chronically ill-appearing, aaox3 Respiratory system: Diminished at bases, mild bibasilar rales, no rhonchi, no wheezing. Respiratory effort normal. Cardiovascular system: S1 & S2 heard, RRR.  Gastrointestinal system: Abdomen is nondistended, soft and nontender.  Normal bowel sounds heard. Central nervous system: aaox3 Extremities:bilateral lower extremity edema Skin: No rashes, lesions or ulcers Psychiatry: aaox3, no agitation     Data Reviewed: I have personally reviewed following labs and imaging studies  CBC: Recent Labs  Lab 08/16/20 0259 08/17/20 0124 08/18/20 0100 08/19/20 0141 08/20/20 0035 08/21/20 0432 08/22/20 0220  WBC 129.4* 130.0* 138.2* 133.9* 134.4* 131.3* 153.8*  NEUTROABS 66.1* 64.2*  --   --   --   --   --   HGB 7.4* 7.4* 7.1* 7.2* 8.5* 8.1* 8.4*  HCT 21.4* 21.6* 21.3* 21.3* 25.6* 24.3* 26.0*  MCV 82.6 83.4 83.9 83.2 82.6 82.9 84.1  PLT 97* 98* 107* 114* 101* 85* 81*    Basic Metabolic Panel: Recent Labs  Lab 08/18/20 0100 08/19/20 0141 08/20/20 0035 08/20/20 1126 08/21/20 0432 08/21/20 0811 08/22/20 0220  NA 130* 130* 131*  --   --  133* 132*  K 5.2* 5.2* 5.6*  --   --  5.1 5.4*  CL 100 102 103  --   --  105 105  CO2 23 23 21*  --   --  21* 20*  GLUCOSE 68* 138* 106*   --   --  162* 110*  BUN 51* 57* 62*  --   --  72* 74*  CREATININE 1.69* 1.94* 2.08*  --  2.24* 2.17* 2.18*  CALCIUM 8.4* 8.4* 8.3*  --   --  8.3* 8.4*  PHOS  --   --   --  5.5*  --   --   --     GFR: Estimated Creatinine Clearance: 28.8 mL/min (A) (by C-G formula based on SCr of 2.18 mg/dL (H)).  Liver Function Tests: No results for input(s): AST, ALT, ALKPHOS, BILITOT, PROT, ALBUMIN in the last 168 hours.  CBG: No results for  input(s): GLUCAP in the last 168 hours.   Recent Results (from the past 240 hour(s))  SARS CORONAVIRUS 2 (TAT 6-24 HRS) Nasopharyngeal Nasopharyngeal Swab     Status: None   Collection Time: 08/27/2020  8:35 PM   Specimen: Nasopharyngeal Swab  Result Value Ref Range Status   SARS Coronavirus 2 NEGATIVE NEGATIVE Final    Comment: (NOTE) SARS-CoV-2 target nucleic acids are NOT DETECTED.  The SARS-CoV-2 RNA is generally detectable in upper and lower respiratory specimens during the acute phase of infection. Negative results do not preclude SARS-CoV-2 infection, do not rule out co-infections with other pathogens, and should not be used as the sole basis for treatment or other patient management decisions. Negative results must be combined with clinical observations, patient history, and epidemiological information. The expected result is Negative.  Fact Sheet for Patients: SugarRoll.be  Fact Sheet for Healthcare Providers: https://www.woods-mathews.com/  This test is not yet approved or cleared by the [place] FDA and  has been authorized for detection and/or diagnosis of SARS-CoV-2 by FDA under an Emergency Use Authorization (EUA). This EUA will remain  in effect (meaning this test can be used) for the duration of the COVID-19 declaration under Se ction 564(b)(1) of the Act, 21 U.S.C. section 360bbb-3(b)(1), unless the authorization is terminated or revoked sooner.  Performed at [facility],  [place] [address] [place], [place] [address]          Radiology Studies: No results found.      Scheduled Meds:  amLODipine  5 mg Oral Daily   enoxaparin (LOVENOX) injection  30 mg Subcutaneous Q24H   febuxostat  40 mg Oral Daily   fluticasone furoate-vilanterol  1 puff Inhalation Daily   isosorbide mononitrate  60 mg Oral QHS   leflunomide  20 mg Oral Daily   lidocaine  1 patch Transdermal Q24H   metoprolol tartrate  12.5 mg Oral BID   pantoprazole  20 mg Oral BID   simvastatin  20 mg Oral QHS   sodium zirconium cyclosilicate  10 g Oral Daily   umeclidinium bromide  1 puff Inhalation Daily   Continuous Infusions:     LOS: 6 days   Time spent: 15 mins Greater than 50% of this time was spent in counseling, explanation of diagnosis, planning of further management, and coordination of care.   Voice Recognition Viviann Spare dictation system was used to create this note, attempts have been made to correct errors. Please contact the author with questions and/or clarifications.   Florencia Reasons, MD PhD FACP [facility]  Available via Epic secure chat 7am-7pm for nonurgent issues Please page for urgent issues To page the attending provider between 7A-7P or the covering provider during after hours 7P-7A, please log into the web site [email or web address] and access using universal [place] password for that web site. If you do not have the password, please call the hospital operator.    08/22/2020, 10:53 AM

## 2020-08-23 DIAGNOSIS — R0609 Other forms of dyspnea: Secondary | ICD-10-CM

## 2020-08-23 DIAGNOSIS — Z515 Encounter for palliative care: Secondary | ICD-10-CM | POA: Diagnosis not present

## 2020-08-23 DIAGNOSIS — R609 Edema, unspecified: Secondary | ICD-10-CM | POA: Diagnosis not present

## 2020-08-23 DIAGNOSIS — R451 Restlessness and agitation: Secondary | ICD-10-CM

## 2020-08-23 DIAGNOSIS — D469 Myelodysplastic syndrome, unspecified: Secondary | ICD-10-CM | POA: Diagnosis not present

## 2020-08-23 DIAGNOSIS — J8410 Pulmonary fibrosis, unspecified: Secondary | ICD-10-CM | POA: Diagnosis not present

## 2020-08-23 DIAGNOSIS — R079 Chest pain, unspecified: Secondary | ICD-10-CM | POA: Diagnosis not present

## 2020-08-23 MED ORDER — GLYCOPYRROLATE 0.2 MG/ML IJ SOLN
0.4000 mg | Freq: Once | INTRAMUSCULAR | Status: AC
  Administered 2020-08-23: 0.4 mg via INTRAVENOUS
  Filled 2020-08-23: qty 2

## 2020-08-23 MED ORDER — FENTANYL 2500MCG IN NS 250ML (10MCG/ML) PREMIX INFUSION
100.0000 ug/h | INTRAVENOUS | Status: DC
  Administered 2020-08-23: 30 ug/h via INTRAVENOUS
  Filled 2020-08-23: qty 250

## 2020-08-23 MED ORDER — HYDROMORPHONE HCL 1 MG/ML IJ SOLN
1.0000 mg | INTRAMUSCULAR | Status: DC | PRN
Start: 2020-08-23 — End: 2020-08-24

## 2020-08-23 MED ORDER — LORAZEPAM 2 MG/ML IJ SOLN
1.0000 mg | INTRAMUSCULAR | Status: DC
  Administered 2020-08-23 (×3): 1 mg via INTRAVENOUS
  Filled 2020-08-23 (×3): qty 1

## 2020-08-23 MED ORDER — BIOTENE DRY MOUTH MT LIQD: 15.0000 mL | OROMUCOSAL | Status: DC | PRN

## 2020-08-23 MED ORDER — ACETAMINOPHEN 650 MG RE SUPP: 650.0000 mg | Freq: Four times a day (QID) | RECTAL | Status: DC | PRN

## 2020-08-23 MED ORDER — HALOPERIDOL LACTATE 2 MG/ML PO CONC
0.5000 mg | ORAL | Status: DC | PRN
  Filled 2020-08-23: qty 0.3

## 2020-08-23 MED ORDER — LORAZEPAM 2 MG/ML IJ SOLN
1.0000 mg | INTRAMUSCULAR | Status: DC | PRN
  Administered 2020-08-23: 1 mg via INTRAVENOUS
  Filled 2020-08-23: qty 1

## 2020-08-23 MED ORDER — SCOPOLAMINE 1 MG/3DAYS TD PT72
1.0000 | MEDICATED_PATCH | TRANSDERMAL | Status: DC
  Administered 2020-08-23: 1.5 mg via TRANSDERMAL
  Filled 2020-08-23: qty 1

## 2020-08-23 MED ORDER — GLYCOPYRROLATE 0.2 MG/ML IJ SOLN
0.4000 mg | Freq: Three times a day (TID) | INTRAMUSCULAR | Status: DC
  Administered 2020-08-23: 0.4 mg via INTRAVENOUS
  Filled 2020-08-23: qty 2

## 2020-08-23 MED ORDER — ACETAMINOPHEN 325 MG PO TABS: 650.0000 mg | ORAL_TABLET | Freq: Four times a day (QID) | ORAL | Status: DC | PRN

## 2020-08-23 MED ORDER — POLYVINYL ALCOHOL 1.4 % OP SOLN
1.0000 [drp] | Freq: Four times a day (QID) | OPHTHALMIC | Status: DC | PRN
  Filled 2020-08-23: qty 15

## 2020-08-23 MED ORDER — HALOPERIDOL LACTATE 5 MG/ML IJ SOLN
0.5000 mg | INTRAMUSCULAR | Status: DC | PRN
  Filled 2020-08-23: qty 1

## 2020-08-23 MED ORDER — LORAZEPAM 2 MG/ML IJ SOLN: 1.0000 mg | INTRAMUSCULAR | Status: DC | PRN

## 2020-08-23 MED ORDER — FENTANYL 2500MCG IN NS 250ML (10MCG/ML) PREMIX INFUSION
30.0000 ug/h | INTRAVENOUS | Status: DC
  Filled 2020-08-23: qty 250

## 2020-08-23 MED ORDER — FUROSEMIDE 10 MG/ML IJ SOLN
40.0000 mg | Freq: Once | INTRAMUSCULAR | Status: AC
  Administered 2020-08-23: 40 mg via INTRAVENOUS
  Filled 2020-08-23: qty 4

## 2020-08-23 MED ORDER — FENTANYL BOLUS VIA INFUSION
30.0000 ug | INTRAVENOUS | Status: DC | PRN
  Administered 2020-08-23: 30 ug via INTRAVENOUS
  Filled 2020-08-23: qty 30

## 2020-08-23 MED ORDER — HALOPERIDOL 0.5 MG PO TABS
0.5000 mg | ORAL_TABLET | ORAL | Status: DC | PRN
  Filled 2020-08-23: qty 1

## 2020-08-26 ENCOUNTER — Encounter: Payer: Self-pay | Admitting: Pulmonary Disease

## 2020-08-26 LAB — SURGICAL PATHOLOGY

## 2020-08-28 ENCOUNTER — Other Ambulatory Visit: Payer: Medicare Other

## 2020-08-28 ENCOUNTER — Ambulatory Visit: Payer: Medicare Other

## 2020-08-28 NOTE — Progress Notes (Signed)
   Interstitial Lung Disease Multidisciplinary Conference   Paul [name]    MRN [id number]    DOB [birth year]  Primary Care Physician:Lalonde, Elyse Jarvis, MD  Referring Physician: Freda Jackson MD  Time of Conference: 7.30am- 8.30am Date of conference: 08/12/2020 Location of Conference: -  Virtual  Participating Pulmonary: Dr Marshell Garfinkel, MD  Pathology: Dr Jaquita Folds, MD Radiology: Dr Vinnie Langton MD Others:   Brief History:  80 y.o. 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  Findings are progressive over time  Additional findings include pleural thickening, calcification Has mild paraseptal emphysema.  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  Recommend informed decision making and work-up for hematologic issues  Time Spent in preparation and discussion:  > 30 min   SIGNATURE   Marshell Garfinkel MD [facility] 08/28/2020, 8:55 AM

## 2020-08-29 ENCOUNTER — Encounter (HOSPITAL_COMMUNITY): Payer: Self-pay | Admitting: Hematology

## 2020-09-01 NOTE — Progress Notes (Addendum)
Daily Progress Note   Patient Name: Paul [name]       Date: 09-03-2020 DOB: September 19, 1940  Age: 80 y.o. MRN#: [id number] Attending Physician: Paul Reasons, MD Primary Care Physician: Paul Lung, MD Admit Date: 08/06/2020  Addendum:  Paul [name] on [place] at approximately 11:45.  Fentanyl gtt had just been hung.   Thus far no adverse reaction to the medication.  Patient appears more comfortable than he did this am likely due to increased ativan dosing.  Still having difficulty with phlegm in is throat - too weak to cough it up.  His cough is concerning to sister at bedside.   Multiple family members at bedside now.    Will re-check patient towards end of day to ensure comfort and adjust medications as needed.      Reason for Consultation/Follow-up: Symptom management at end of life.  Patient will flutter eyes open to voice.  He is quite tachycardic.  He is attempting to push up with his elbows in agitation.  His breathing is rapid and shallow.  He appears to be actively dying.  Subjective: I talked with his sister at bedside.  She is quite anxious over his coughing and secretions and asks multiple times if we can get those out.   I explained that if we repeatedly suction him we will cause more secretions - instead we will attempt to dry them with medications.  His sister also shares that when he received dilaudid he sat right up on the side of the bed.  Paul [name] seems to have a paradoxical reaction to opioids.  He had myoclonus with morphine.  I talked with his RN and we decided to try a fentanyl  gtt.  His RN will watch closely if he has an adverse reaction we will simply eliminate opioids and use other measures for comfort.  I discussed with Paul [name] (sister) that Paul [name] is actively  dying and does not appear capable of tolerating an ambulance transport today.     Assessment: Actively dying.  Dyspneic and agitated.   Patient Profile/HPI:  80 y.o. male  with past medical history of CAD, pulmonary fibrosis, rheumatoid arthritis, CKD 3a, hypertension, infrarenal AAA, dilation of ascending thoracic aorta, and MDS/MPN admitted on 08/30/2020 with chest pain. Patient was admitted to the hospital 1 week ago with chest pain,  evaluated by cardiology with low suspicion for cardiac etiology.  He has continued to have frequent episodes of chest pain throughout the day and night since his discharge on 08/09/2020. This admission, CTA chest with no change in bilateral pleural effusions and pulmonary fibrosis. High-sensitivity troponin was normal x3.  Cardiology and oncology were consulted.  Cardiology did not feel that patient's chest pain was cardiac in etiology. Patient with significant leukocytosis - had bone marrow biopsy 7/20. PMT consulted to discuss [name].  Length of Stay: 7   Vital Signs: BP 124/77   Pulse (!) 56   Temp 97.7 F (36.5 C) (Axillary)   Resp 17   Ht 5' 10" (1.778 m)   Wt 77.5 kg   SpO2 98%   BMI 24.52 kg/m  SpO2: SpO2: 98 % O2 Device: O2 Device: Nasal Cannula O2 Flow Rate: O2 Flow Rate (L/min): 2 L/min       Palliative Assessment/Data: 10%     Palliative Care Plan    Recommendations/Plan: Initiated comfort measures only in the hospital - adjusted ordered. Communicated with the medical team/TOC/[facility] that patient will not transfer today.   Uncertain if he will discharge from the hospital at all. Dyspnea - will initiate a fentanyl gtt.  If he tolerates it will increase the range and add boluses. Agitation - will schedule and increase ativan  Secretions appear to be bothering family at bedside greatly.  Lasix IV, Robinul, scopolamine patch. PMT will re-round several times today to ensure comfort.   Code Status:  DNR  Prognosis:  Hours - Days    Discharge Planning: Anticipated Hospital Death.  If he happens to stabilize we will re-visit transport to [place] was discussed with Sister, RN, Medical team, [facility], [facility]  Thank you for allowing the Palliative Medicine Team to assist in the care of this patient.  Total time spent:  65 min. Time in:  8:30 Time out:  9:35     Greater than 50%  of this time was spent counseling and coordinating care related to the above assessment and plan.  Paul Jenny, PA-C Palliative Medicine  Please contact Palliative MedicineTeam phone at [phone] for questions and concerns between 7 am - 7 pm.   Please see AMION for individual provider pager numbers.

## 2020-09-01 NOTE — Progress Notes (Signed)
Patient pronounced dead at 1806 by Nona Dell RN and Kerrie Buffalo RN.  Remainder of iv fentanyl drip wasted in stericycle via both RNs.

## 2020-09-01 NOTE — Progress Notes (Signed)
[place] [id number]: Manufacturing engineer [facility]) Hospital Liaison: RN note     Made aware by Dr. Erlinda Hong that pt more appropriate for [facility] inpatient hospice, no longer a candidate for hospice services at home. In follow up  Florentina Jenny, PA with PMT,  advised pt would not be stable to transport and would be an expected hospital death.  [place] liaisons will sign off.  Please do not hesitate to contact [facility] if needs change.  Thank you for the opportunity to participate in this patient's care.  Domenic Moras, BSN, RN       [place] (listed on AMION under [phone]  [phone] (24h on call)

## 2020-09-01 NOTE — Progress Notes (Signed)
PROGRESS NOTE    Paul [name]  GNF:[id number] DOB: 1940/12/24 DOA: 08/19/2020 PCP: Denita Lung, MD    Chief Complaint  Patient presents with   Chest Pain    Brief Narrative:  CAD, pulmonary fibrosis, rheumatoid arthritis, MDs, presents with chest pain  Patient was admitted to the hospital 1 week ago with chest pain, evaluated by cardiology with low suspicion for cardiac etiology.  He has continued to have frequent episodes of chest pain throughout the day and night since his discharge on 08/09/2020, seems to be worse when he lays down, involving the upper chest and shoulders, improved with nitroglycerin and sitting upright.  He has been sleeping in a recliner for the past few days due to the symptoms  CTA chest with no change in bilateral pleural effusions and pulmonary fibrosis. High-sensitivity troponin was normal x3.  Cardiology and oncology were consulted.  Cardiology did not feel that patient's chest pain was cardiac in etiology   Subjective:  He is deteriorating , he is now on fentanyl drip Likely hospice death Family at bedside      Assessment & Plan:   Principal Problem:   Chest pain Active Problems:   CAD (coronary artery disease)   Hypertension complicating diabetes ([place])   Anemia of chronic disease   AAA (abdominal aortic aneurysm) ([name])-infrarenal 3.4 cm   Type 2 diabetes mellitus with diabetic neuropathy, without long-term current use of insulin ([name])   MDS/MPN (myelodysplastic/myeloproliferative neoplasms) ([name])   Chronic low back pain without sciatica   Rheumatoid arthritis ([name])   Pulmonary fibrosis ([name])   Chronic kidney disease, stage 3a ([name])   Thrombocytopenia ([name])   Leukocytosis   Palliative care encounter   Chest pain, recurrent Troponin negative CTA no PE Cardiology do not think chest pain is cardiac in nature On PPI twice daily Now comfort measures, likely hospital death,  palliative care input appreciated    Significant  leukocytosis, oncology consulted, patient is status post bone marrow biopsy on 7/20 MDS/anemia/thrombocytopenia, suspect post 1 unit PRBC transfusion Now comfort measures, likely hospital death,  palliative care input appreciated    Hypokalemia/hypophosphatemia/AKI/CKD 3B Uric acid 5.5, though he is on Uloric Tumor lysis? Treated with  Lokelma Now comfort measures, likely hospital death,  palliative care input appreciated     History of CAD Presented with chest pain, cardiology do not think chest pain is cardiac in etiology Continue Zocor, Lopressor, Imdur Aspirin and Plavix held due to bone marrow biopsy Also has anemia/thrombocytopenia Now comfort measures, likely hospital death,  palliative care input appreciated     Chronic diastolic CHF Does has bilateral lower extremity edema, currently on gentle hydration due to aki,  venous doppler no dvt Now comfort measures, likely hospital death,  palliative care input appreciated    Rheumatoid arthritis/pulmonary fibrosis Was on leflunomide Now comfort measures, likely hospital death,  palliative care input appreciated     Failure to thrive, progressive decline,  Now comfort measures, likely hospital death,  palliative care input appreciated      Body mass index is 24.52 kg/m..   .    Unresulted Labs (From admission, onward)    None         DVT prophylaxis: now on comfort measures    Code Status: DNR Family Communication: Family at bedside Disposition:   Status is: Inpatient  Dispo: The patient is from: Home              Anticipated d/c is to:  Now comfort measures,  likely hospital death,  palliative care input appreciated                              Consultants:  Cardiology Hematology oncology Palliative care Interventional radiology  Procedures:  Bone marrow biopsy  Antimicrobials:   Anti-infectives (From admission, onward)    None           Objective: Vitals:   08/22/20 0830  08/22/20 1138 08/22/20 2105 2020/09/17 0930  BP:  (!) 114/47 (!) 135/50 124/77  Pulse:  (!) 55 (!) 56   Resp: _0 Temp:  97.6 F (36.4 C) 97.7 F (36.5 C)   TempSrc:  Axillary Axillary   SpO2:  98% 98%   Weight:      Height:       No intake or output data in the 24 hours ending 09-17-2020 1325  Filed Weights   08/20/20 0535 08/21/20 0500 08/22/20 0532  Weight: 69.4 kg 77.8 kg 77.5 kg    Examination:  General exam: On fentanyl drip, does not appear in discomfort    Data Reviewed: I have personally reviewed following labs and imaging studies  CBC: Recent Labs  Lab 08/17/20 0124 08/18/20 0100 08/19/20 0141 08/20/20 0035 08/21/20 0432 08/22/20 0220  WBC 130.0* 138.2* 133.9* 134.4* 131.3* 153.8*  NEUTROABS 64.2*  --   --   --   --   --   HGB 7.4* 7.1* 7.2* 8.5* 8.1* 8.4*  HCT 21.6* 21.3* 21.3* 25.6* 24.3* 26.0*  MCV 83.4 83.9 83.2 82.6 82.9 84.1  PLT 98* 107* 114* 101* 85* 81*    Basic Metabolic Panel: Recent Labs  Lab 08/18/20 0100 08/19/20 0141 08/20/20 0035 08/20/20 1126 08/21/20 0432 08/21/20 0811 08/22/20 0220  NA 130* 130* 131*  --   --  133* 132*  K 5.2* 5.2* 5.6*  --   --  5.1 5.4*  CL 100 102 103  --   --  105 105  CO2 23 23 21*  --   --  21* 20*  GLUCOSE 68* 138* 106*  --   --  162* 110*  BUN 51* 57* 62*  --   --  72* 74*  CREATININE 1.69* 1.94* 2.08*  --  2.24* 2.17* 2.18*  CALCIUM 8.4* 8.4* 8.3*  --   --  8.3* 8.4*  PHOS  --   --   --  5.5*  --   --   --     GFR: Estimated Creatinine Clearance: 28.8 mL/min (A) (by C-G formula based on SCr of 2.18 mg/dL (H)).  Liver Function Tests: No results for input(s): AST, ALT, ALKPHOS, BILITOT, PROT, ALBUMIN in the last 168 hours.  CBG: No results for input(s): GLUCAP in the last 168 hours.   Recent Results (from the past 240 hour(s))  SARS CORONAVIRUS 2 (TAT 6-24 HRS) Nasopharyngeal Nasopharyngeal Swab     Status: None   Collection Time: 08/12/2020  8:35 PM   Specimen: Nasopharyngeal Swab   Result Value Ref Range Status   SARS Coronavirus 2 NEGATIVE NEGATIVE Final    Comment: (NOTE) SARS-CoV-2 target nucleic acids are NOT DETECTED.  The SARS-CoV-2 RNA is generally detectable in upper and lower respiratory specimens during the acute phase of infection. Negative results do not preclude SARS-CoV-2 infection, do not rule out co-infections with other pathogens, and should not be used as the sole basis for treatment or other patient management decisions. Negative results must be  combined with clinical observations, patient history, and epidemiological information. The expected result is Negative.  Fact Sheet for Patients: SugarRoll.be  Fact Sheet for Healthcare Providers: https://www.woods-mathews.com/  This test is not yet approved or cleared by the [place] FDA and  has been authorized for detection and/or diagnosis of SARS-CoV-2 by FDA under an Emergency Use Authorization (EUA). This EUA will remain  in effect (meaning this test can be used) for the duration of the COVID-19 declaration under Se ction 564(b)(1) of the Act, 21 U.S.C. section 360bbb-3(b)(1), unless the authorization is terminated or revoked sooner.  Performed at [facility], [place] [address], [place], [place] [address]          Radiology Studies: No results found.      Scheduled Meds:  fluticasone furoate-vilanterol  1 puff Inhalation Daily   glycopyrrolate  0.4 mg Intravenous TID   lidocaine  1 patch Transdermal Q24H   LORazepam  1 mg Intravenous Q4H   scopolamine  1 patch Transdermal Q72H   Continuous Infusions:  fentaNYL infusion INTRAVENOUS 50 mcg/hr (09-18-2020 1232)      LOS: 7 days   Time spent: 15 mins Greater than 50% of this time was spent in counseling, explanation of diagnosis, planning of further management, and coordination of care.   Voice Recognition Viviann Spare dictation system was used to create this note, attempts  have been made to correct errors. Please contact the author with questions and/or clarifications.   Florencia Reasons, MD PhD FACP [facility]  Available via Epic secure chat 7am-7pm for nonurgent issues Please page for urgent issues To page the attending provider between 7A-7P or the covering provider during after hours 7P-7A, please log into the web site [email or web address] and access using universal [place] password for that web site. If you do not have the password, please call the hospital operator.    September 18, 2020, 1:25 PM

## 2020-09-01 NOTE — Death Summary Note (Signed)
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    Filed Weights   08/20/20 0535 08/21/20 0500 08/22/20 0532  Weight: 69.4 kg 77.8 kg 77.5 kg    History of present illness: (Per admitting MD Dr. Myna Hidalgo) Chief Complaint: Chest pain    HPI: Paul [name] is a 80 y.o. male with medical history significant for CAD, pulmonary fibrosis, rheumatoid arthritis, CKD 3a, hypertension, infrarenal AAA, dilation of ascending thoracic aorta, MDS/MPN, now presenting to emergency department for evaluation of chest pain.  Patient was admitted to the hospital 1 week ago with chest pain, evaluated by cardiology with low suspicion for cardiac etiology.  He has continued to have frequent episodes of chest pain throughout the day and night since his discharge on 08/09/2020, seems to be worse when he lays down, involving the upper chest and shoulders, improved with nitroglycerin and sitting upright.  He has been sleeping in a recliner for the past few days due to the symptoms.  He denies any recent change in  his exertional dyspnea and denies any fevers or chills.  He denies headache, change in vision, or focal numbness or weakness.   ED Course: Upon arrival to the ED, patient is found to be afebrile, saturating well on room air, and with stable blood pressure.  EKG features sinus rhythm with LVH and repolarization abnormality.  Bilateral lower extremity venous Dopplers were negative for DVT.  CTA chest with no change in bilateral pleural effusions and pulmonary fibrosis.  Chemistry panel notable for sodium of 130 and creatinine 1.47.  CBC with leukocytosis 231,500, hemoglobin 8.0, and platelets 106,000.  High-sensitivity troponin was normal x3.  Cardiology and oncology were consulted by the ED physician, patient was treated with multiple doses of morphine, COVID-19 screening test is pending, and hospitalists asked to admit.  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   Type 2 diabetes mellitus with diabetic neuropathy, without long-term current use of insulin ([name])   MDS/MPN (myelodysplastic/myeloproliferative neoplasms) ([name])   Chronic low back pain without sciatica   Rheumatoid arthritis ([name])   Pulmonary fibrosis ([name])   Chronic kidney disease, stage 3a ([name])   Thrombocytopenia ([name])   Leukocytosis   Palliative care encounter   Chest pain, recurrent Troponin negative CTA no PE Cardiology do not think chest pain is cardiac in nature Treated with  PPI twice daily Transitioned to comfort measures,  palliative care input appreciated , he passed away with family at bedside on 18:06.  Significant leukocytosis,  wbc 153.8 oncology consulted, patient is status post bone marrow biopsy on 7/20 MDS/anemia/thrombocytopenia, suspect post 1 unit PRBC transfusion      Hypokalemia/hypophosphatemia/AKI/CKD  3B Uric acid 5.5 while on Uloric Tumor lysis? Treated with  Lokelma        History of CAD Presented with chest pain, cardiology do not think chest pain is cardiac in etiology Treated with  Zocor, Lopressor, Imdur Aspirin and Plavix held due to bone marrow biopsy Also has anemia/thrombocytopenia        Chronic diastolic CHF Does has bilateral lower extremity edema, venous doppler no dvt      Rheumatoid arthritis/pulmonary fibrosis Was on leflunomide        Failure to thrive, progressive decline, transitioned to comfort measures, in hospital death  palliative care input appreciated         Body mass index is 24.52 kg/m.Marland Kitchen    Consultants:  Cardiology Hematology oncology Palliative care Interventional radiology   Procedures:  Bone marrow biopsy   Allergies as of 09/22/2020       Reactions   Allopurinol Other (See Comments)   thrombocytopenia   Metformin And Related Diarrhea        Medication List     STOP taking these medications    amLODipine 5 MG tablet Commonly known as: NORVASC   aspirin 81 MG chewable tablet   Breztri Aerosphere 160-9-4.8 MCG/ACT Aero Generic drug: Budeson-Glycopyrrol-Formoterol   Centrum tablet   clopidogrel 75 MG tablet Commonly known as: PLAVIX   Enbrel SureClick 50 MG/ML injection Generic drug: etanercept   febuxostat 40 MG tablet Commonly known as: ULORIC   fish oil-omega-3 fatty acids 1000 MG capsule   isosorbide mononitrate 60 MG 24 hr tablet Commonly known as: IMDUR   leflunomide 20 MG tablet Commonly known as: ARAVA   metoprolol tartrate 25 MG tablet Commonly known as: LOPRESSOR   nitroGLYCERIN 0.4 MG SL tablet Commonly known as: NITROSTAT   pyridoxine 100 MG tablet Commonly known as: B-6   simvastatin 20 MG tablet Commonly known as: ZOCOR       Allergies  Allergen Reactions   Allopurinol Other (See Comments)    thrombocytopenia   Metformin And Related Diarrhea      The results of  significant diagnostics from this hospitalization (including imaging, microbiology, ancillary and laboratory) are listed below for reference.    Significant Diagnostic Studies: DG Chest 2 View  Result Date: 08/07/2020 CLINICAL DATA:  Shortness of breath. EXAM: CHEST - 2 VIEW COMPARISON:  CT 07/14/2020.  Chest x-ray 12/01/2019. FINDINGS: Stable mediastinal prominence consistent with mediastinal fat again noted. Stable cardiomegaly. No pulmonary venous congestion. Chronic interstitial changes are again noted. Superimposed active interstitial process including interstitial edema and or pneumonitis cannot be excluded. Small bilateral pleural effusions are noted. Stable right apical pleural thickening consistent scarring. No pneumothorax. Degenerative change thoracic spine. IMPRESSION: 1.  Stable cardiomegaly.  No pulmonary venous congestion. 2. Chronic interstitial changes are again noted. Superimposed active interstitial process including interstitial edema and or pneumonitis cannot be excluded. Small bilateral pleural effusions are noted. Electronically Signed   By: Marcello Moores  Register   On: 08/07/2020 13:19   CT Angio Chest PE W and/or Wo Contrast  Result Date: 08/21/2020 CLINICAL DATA:  Chest pain.  Shortness of breath. EXAM: CT ANGIOGRAPHY CHEST WITH CONTRAST TECHNIQUE: Multidetector CT imaging of the chest was performed using the standard protocol during bolus administration of intravenous contrast. Multiplanar CT image reconstructions and MIPs were obtained to evaluate the  vascular anatomy. CONTRAST:  9m OMNIPAQUE IOHEXOL 350 MG/ML SOLN COMPARISON:  Plain film of earlier today.  CT 07/14/2020. FINDINGS: Cardiovascular: The quality of this exam for evaluation of pulmonary embolism is good. No evidence of pulmonary embolism. Pulmonary artery enlargement, outflow tract 3.1 cm Aorta not well opacified. Aortic atherosclerosis. Tortuous thoracic aorta. Mild cardiomegaly. Three vessel coronary artery  calcification. Mediastinum/Nodes: Prominent mediastinal nodes are again identified, favored to be reactive. Example at up to 1.1 cm in the high right paratracheal station. Lungs/Pleura: Small left greater than right pleural effusions are unchanged. Bilateral calcified pleural plaques. Right greater than left apical pleuroparenchymal scarring. 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 Basilar and peripheral predominant subpleural reticulation with scattered basilar honeycombing. Right lower lobe calcified granuloma. Right middle lobe 5 mm pulmonary nodule is similar on 107/6. Upper Abdomen: Old granulomatous disease in the liver and spleen. Normal imaged portions of the stomach, gallbladder, pancreas, adrenal glands, kidneys. Abdominal aortic atherosclerosis. Musculoskeletal: Lower thoracic spondylosis with mild wedging of the T12 vertebral body. Review of the MIP images confirms the above findings. IMPRESSION: 1.  No evidence of pulmonary embolism. 2. No significant change compared to the CT of 07/14/2020. 3. Interstitial lung disease, consistent with asbestosis and asbestos related pleural disease. 4. Small bilateral pleural effusions. 5. Coronary artery atherosclerosis. Aortic Atherosclerosis (ICD10-I70.0). 6. Pulmonary artery enlargement suggests pulmonary arterial hypertension. Electronically Signed   By: KAbigail MiyamotoM.D.   On: 08/10/2020 19:18   DG Chest Port 1 View  Result Date: 08/22/2020 CLINICAL DATA:  Chest pain and shortness of breath. Pulmonary fibrosis. EXAM: PORTABLE CHEST 1 VIEW COMPARISON:  08/07/2020 FINDINGS: Stable cardiomediastinal contours. Persistent bilateral pleural effusions identified. Chronic reticular interstitial opacities with a lower lung zone predominance is again seen compatible with diagnosis of pulmonary fibrosis. No superimposed airspace consolidation. IMPRESSION: No change in bilateral pleural effusions and pulmonary fibrosis.  Electronically Signed   By: TKerby MoorsM.D.   On: 08/13/2020 13:30   CT BONE MARROW BIOPSY & ASPIRATION  Result Date: 08/20/2020 CLINICAL DATA:  Leukocytosis. EXAM: CT GUIDED BONE MARROW ASPIRATION AND BIOPSY ANESTHESIA/SEDATION: Versed 1.5 mg IV, Fentanyl 50 mcg IV Total Moderate Sedation Time:   21 minutes. The patient's level of consciousness and physiologic status were continuously monitored during the procedure by Radiology nursing. PROCEDURE: The procedure risks, benefits, and alternatives were explained to the patient. Questions regarding the procedure were encouraged and answered. The patient understands and consents to the procedure. A time out was performed prior to initiating the procedure. The right gluteal region was prepped with chlorhexidine. Sterile gown and sterile gloves were used for the procedure. Local anesthesia was provided with 1% Lidocaine. Under CT guidance, an 11 gauge On Control bone cutting needle was advanced from a posterior approach into the right iliac bone. Needle positioning was confirmed with CT. Initial non heparinized and heparinized aspirate samples were obtained of bone marrow. Core biopsy was performed via the On Control drill needle. COMPLICATIONS: None FINDINGS: Inspection of initial aspirate did reveal visible particles. Intact core biopsy sample was obtained. IMPRESSION: CT guided bone marrow biopsy of right posterior iliac bone with both aspirate and core samples obtained. Electronically Signed   By: GAletta EdouardM.D.   On: 08/20/2020 10:48   VAS [place]OWER EXTREMITY VENOUS (DVT) (ONLY [facility] & [facility])  Result Date: 08/16/2020  Lower Venous DVT Study Patient Name:  CGOODWIN KAMPHAUS Date of Exam:   08/31/2020 Medical Rec #: [id number]  Accession #:    [id number] Date of Birth: 06/14/1940            Patient Gender: M Patient Age:   80Y Exam Location:  [facility] Procedure:      VAS [place] LOWER EXTREMITY VENOUS (DVT) Referring Phys: [id number] JULIE HAVILAND  --------------------------------------------------------------------------------  Indications: Swelling, and SOB.  Comparison Study: No prior study Performing Technologist: Maudry Mayhew MHA, RDMS, RVT, RDCS  Examination Guidelines: A complete evaluation includes B-mode imaging, spectral Doppler, color Doppler, and power Doppler as needed of all accessible portions of each vessel. Bilateral testing is considered an integral part of a complete examination. Limited examinations for reoccurring indications may be performed as noted. The reflux portion of the exam is performed with the patient in reverse Trendelenburg.  +---------+---------------+---------+-----------+----------+--------------+ RIGHT    CompressibilityPhasicitySpontaneityPropertiesThrombus Aging +---------+---------------+---------+-----------+----------+--------------+ CFV      Full           Yes      Yes                                 +---------+---------------+---------+-----------+----------+--------------+ SFJ      Full                                                        +---------+---------------+---------+-----------+----------+--------------+ FV Prox  Full                                                        +---------+---------------+---------+-----------+----------+--------------+ FV Mid   Full                                                        +---------+---------------+---------+-----------+----------+--------------+ FV DistalFull                                                        +---------+---------------+---------+-----------+----------+--------------+ PFV      Full                                                        +---------+---------------+---------+-----------+----------+--------------+ POP      Full           Yes      Yes                                 +---------+---------------+---------+-----------+----------+--------------+ PTV      Full                                                         +---------+---------------+---------+-----------+----------+--------------+  PERO     Full                                                        +---------+---------------+---------+-----------+----------+--------------+   +---------+---------------+---------+-----------+----------+--------------+ LEFT     CompressibilityPhasicitySpontaneityPropertiesThrombus Aging +---------+---------------+---------+-----------+----------+--------------+ CFV      Full           Yes      Yes                                 +---------+---------------+---------+-----------+----------+--------------+ SFJ      Full                                                        +---------+---------------+---------+-----------+----------+--------------+ FV Prox  Full                                                        +---------+---------------+---------+-----------+----------+--------------+ FV Mid   Full                                                        +---------+---------------+---------+-----------+----------+--------------+ FV DistalFull                                                        +---------+---------------+---------+-----------+----------+--------------+ PFV      Full                                                        +---------+---------------+---------+-----------+----------+--------------+ POP      Full           Yes      Yes                                 +---------+---------------+---------+-----------+----------+--------------+ PTV      Full                                                        +---------+---------------+---------+-----------+----------+--------------+ PERO     Full                                                        +---------+---------------+---------+-----------+----------+--------------+  Summary: BILATERAL: - No evidence of deep vein thrombosis seen in the  lower extremities, bilaterally. -No evidence of popliteal cyst, bilaterally.   *See table(s) above for measurements and observations. Electronically signed by Jamelle Haring on 08/30/2020 at 5:13:17 PM.    Final     Microbiology: Recent Results (from the past 240 hour(s))  SARS CORONAVIRUS 2 (TAT 6-24 HRS) Nasopharyngeal Nasopharyngeal Swab     Status: None   Collection Time: 08/05/2020  8:35 PM   Specimen: Nasopharyngeal Swab  Result Value Ref Range Status   SARS Coronavirus 2 NEGATIVE NEGATIVE Final    Comment: (NOTE) SARS-CoV-2 target nucleic acids are NOT DETECTED.  The SARS-CoV-2 RNA is generally detectable in upper and lower respiratory specimens during the acute phase of infection. Negative results do not preclude SARS-CoV-2 infection, do not rule out co-infections with other pathogens, and should not be used as the sole basis for treatment or other patient management decisions. Negative results must be combined with clinical observations, patient history, and epidemiological information. The expected result is Negative.  Fact Sheet for Patients: SugarRoll.be  Fact Sheet for Healthcare Providers: https://www.woods-mathews.com/  This test is not yet approved or cleared by the [place] FDA and  has been authorized for detection and/or diagnosis of SARS-CoV-2 by FDA under an Emergency Use Authorization (EUA). This EUA will remain  in effect (meaning this test can be used) for the duration of the COVID-19 declaration under Se ction 564(b)(1) of the Act, 21 U.S.C. section 360bbb-3(b)(1), unless the authorization is terminated or revoked sooner.  Performed at [facility], [place] [address], [place], [place] [address]      Labs: Basic Metabolic Panel: Recent Labs  Lab 08/18/20 0100 08/19/20 0141 08/20/20 0035 08/20/20 1126 08/21/20 0432 08/21/20 0811 08/22/20 0220  NA 130* 130* 131*  --   --  133* 132*  K 5.2* 5.2* 5.6*   --   --  5.1 5.4*  CL 100 102 103  --   --  105 105  CO2 23 23 21*  --   --  21* 20*  GLUCOSE 68* 138* 106*  --   --  162* 110*  BUN 51* 57* 62*  --   --  72* 74*  CREATININE 1.69* 1.94* 2.08*  --  2.24* 2.17* 2.18*  CALCIUM 8.4* 8.4* 8.3*  --   --  8.3* 8.4*  PHOS  --   --   --  5.5*  --   --   --    Liver Function Tests: No results for input(s): AST, ALT, ALKPHOS, BILITOT, PROT, ALBUMIN in the last 168 hours. No results for input(s): LIPASE, AMYLASE in the last 168 hours. No results for input(s): AMMONIA in the last 168 hours. CBC: Recent Labs  Lab 08/17/20 0124 08/18/20 0100 08/19/20 0141 08/20/20 0035 08/21/20 0432 08/22/20 0220  WBC 130.0* 138.2* 133.9* 134.4* 131.3* 153.8*  NEUTROABS 64.2*  --   --   --   --   --   HGB 7.4* 7.1* 7.2* 8.5* 8.1* 8.4*  HCT 21.6* 21.3* 21.3* 25.6* 24.3* 26.0*  MCV 83.4 83.9 83.2 82.6 82.9 84.1  PLT 98* 107* 114* 101* 85* 81*   Cardiac Enzymes: No results for input(s): CKTOTAL, CKMB, CKMBINDEX, TROPONINI in the last 168 hours. BNP: BNP (last 3 results) Recent Labs    12/02/19 1034 08/07/20 1301  BNP 610.7* 634.6*    ProBNP (last 3 results) No results for input(s): PROBNP in the last 8760 hours.  CBG:  No results for input(s): GLUCAP in the last 168 hours.     Signed:  Florencia Reasons MD, PhD, FACP  [facility] 09-09-2020, 6:27 PM

## 2020-09-01 NOTE — Progress Notes (Signed)
Pt sitting up in bed unresponsive. Three family members present at bedside. I told them why I was they and said I could proceed. The chaplain offered caring and supportive presence, prayers and blessings. I informed the family they could have the chaplain paged at anytime.

## 2020-09-01 DEATH — deceased

## 2020-09-03 ENCOUNTER — Ambulatory Visit: Payer: Medicare Other | Admitting: Pulmonary Disease

## 2020-09-09 ENCOUNTER — Ambulatory Visit: Payer: Medicare Other | Admitting: Pulmonary Disease

## 2020-09-09 ENCOUNTER — Encounter (HOSPITAL_COMMUNITY): Payer: Self-pay | Admitting: Hematology

## 2020-09-11 ENCOUNTER — Other Ambulatory Visit: Payer: Medicare Other

## 2020-09-11 ENCOUNTER — Ambulatory Visit: Payer: Medicare Other

## 2020-09-15 LAB — SURGICAL PATHOLOGY

## 2020-09-25 ENCOUNTER — Ambulatory Visit: Payer: Medicare Other | Admitting: Hematology

## 2020-09-25 ENCOUNTER — Ambulatory Visit: Payer: Medicare Other

## 2020-09-25 ENCOUNTER — Other Ambulatory Visit: Payer: Medicare Other

## 2020-10-09 ENCOUNTER — Ambulatory Visit: Payer: Medicare Other | Admitting: Hematology

## 2020-10-25 IMAGING — DX PORTABLE CHEST - 1 VIEW
1 series · 1 of 1 positions shown · non-contrast
Comparison: September 27, 2017

CLINICAL DATA: Chest pain

EXAM:
PORTABLE CHEST 1 VIEW

[chest ap]
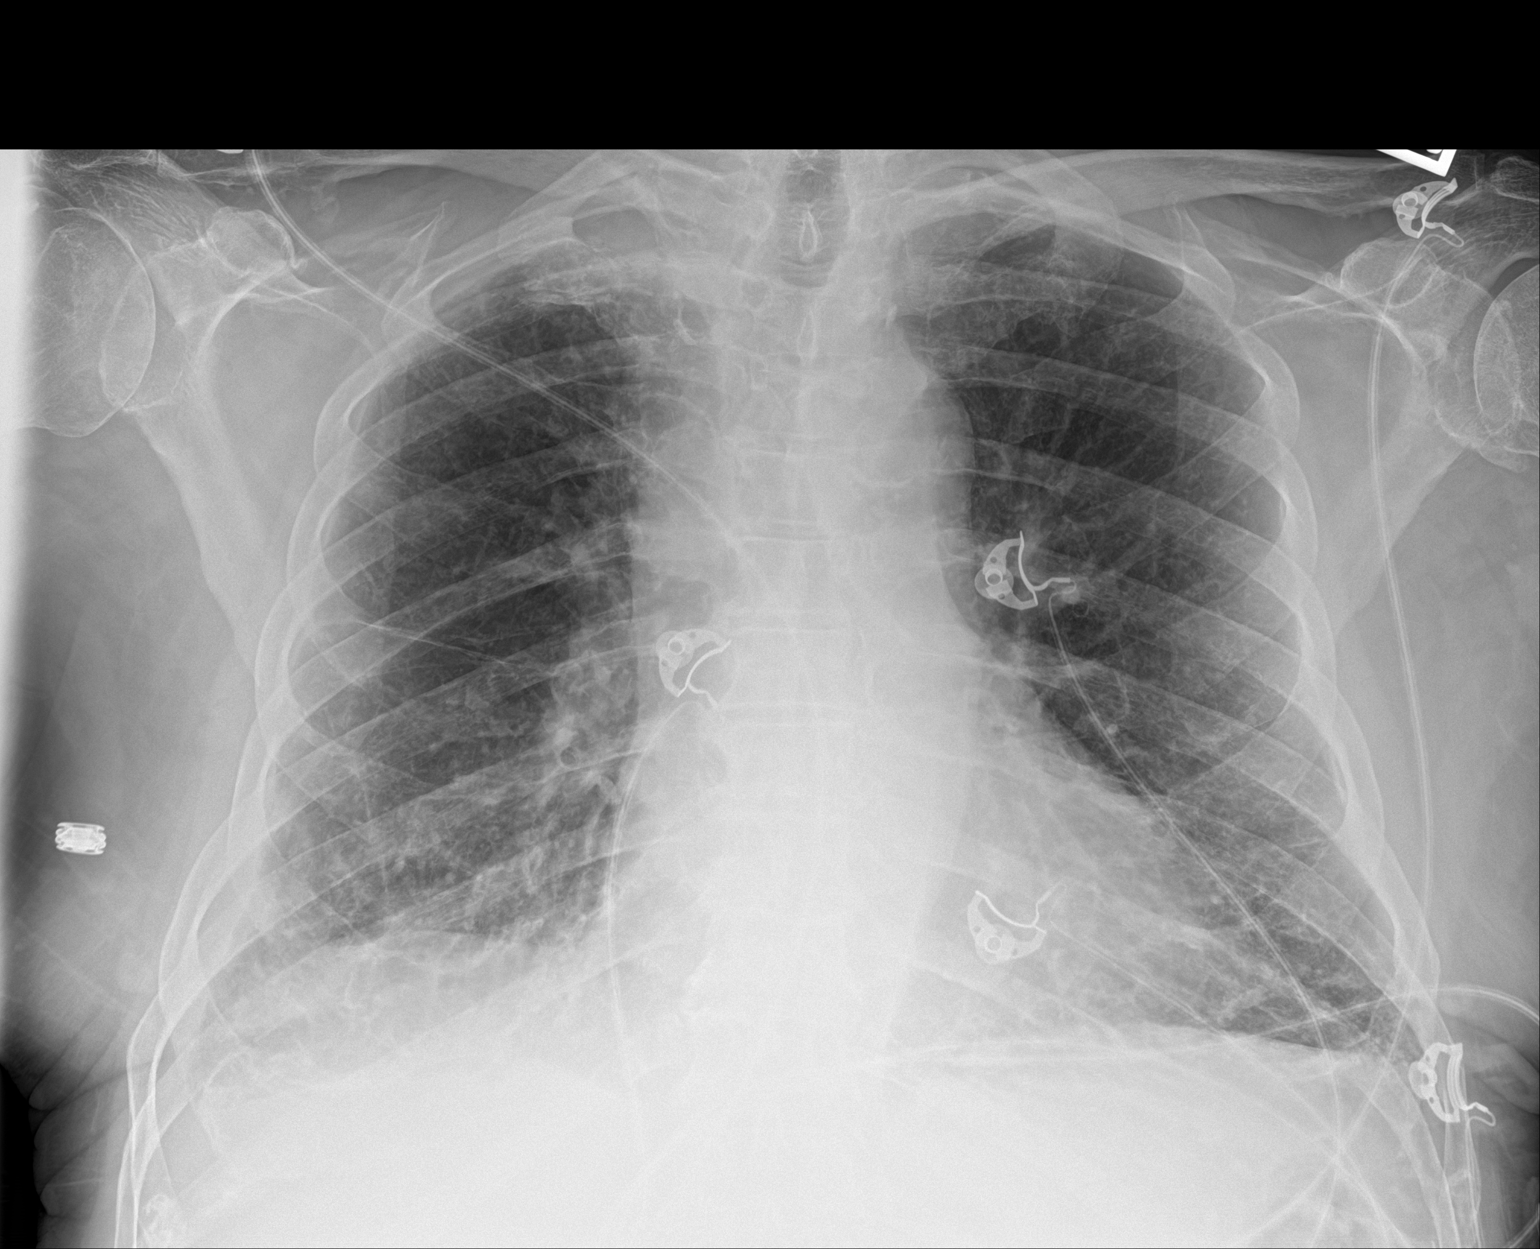

[1 of 1 positions shown; findings below may reference images not displayed]

FINDINGS: The heart size is mildly enlarged. No pneumothorax. No large pleural
effusion. Pulmonary fibrosis is again noted. There is no acute
osseous abnormality. Aortic calcifications are noted.
IMPRESSION: No active disease.

## 2021-08-26 IMAGING — DX DG LUMBAR SPINE COMPLETE 4+V
6 series · 6 of 6 positions shown · non-contrast
Comparison: Lumbar radiograph 11/07/2014

CLINICAL DATA: Acute low back pain for 2 weeks. Patient reports
bilateral low back pain for 3 weeks without sciatica. No known
injury. Progressive pain.

EXAM:
LUMBAR SPINE - COMPLETE 4+ VIEW

[l-spine ap]
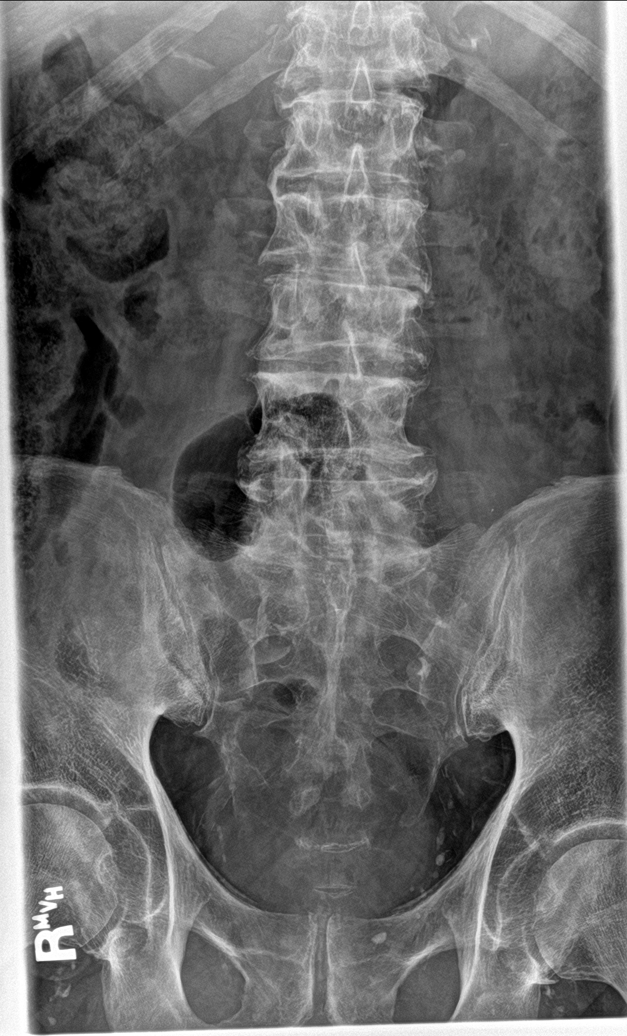

[l-spine obl (1 of 2)]
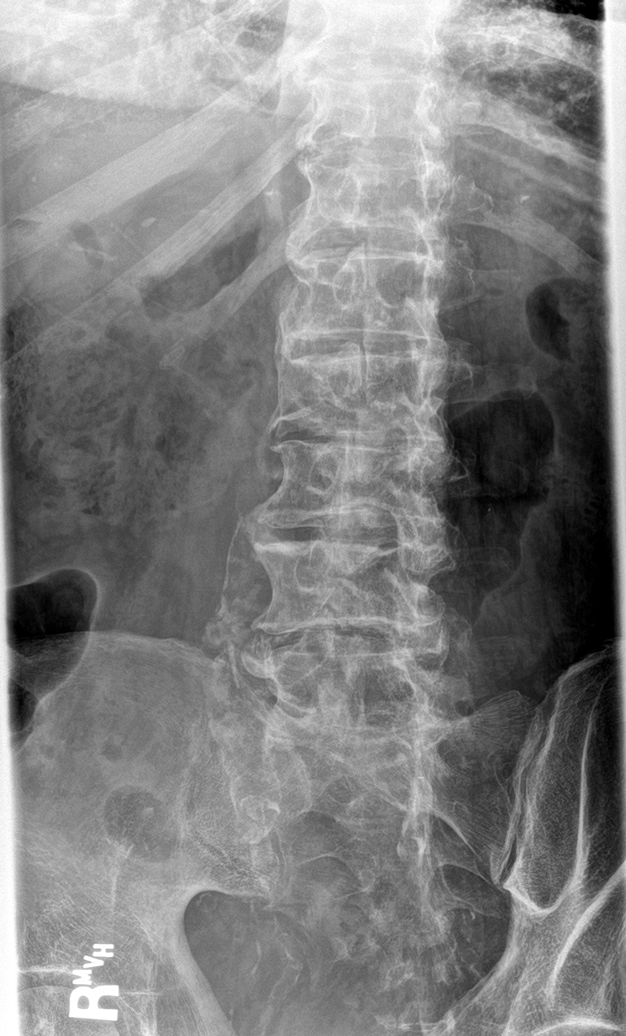

[l-spine obl (2 of 2)]
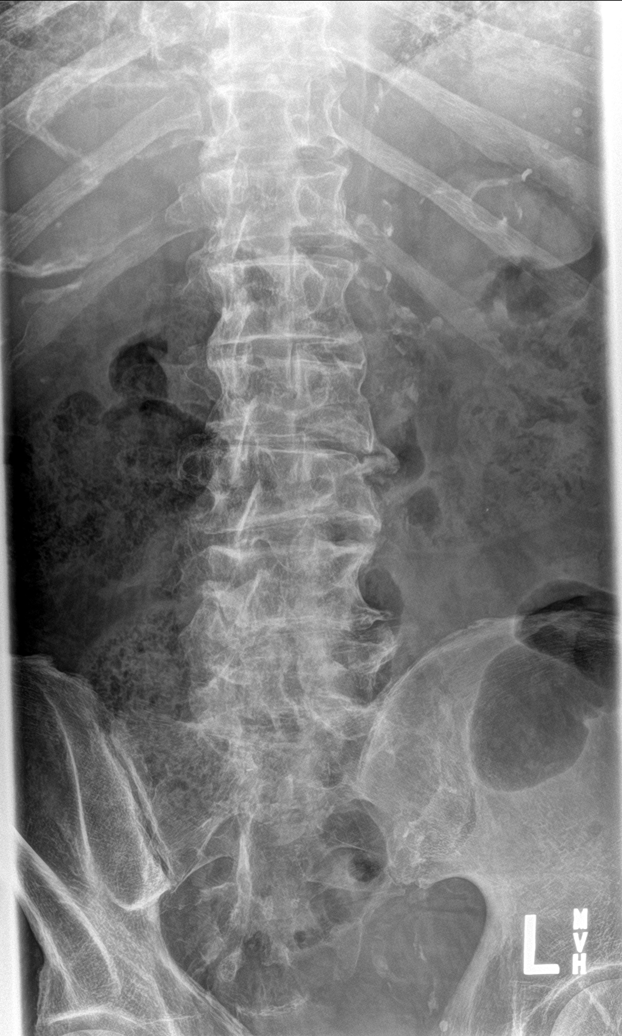

[l-spine lat]
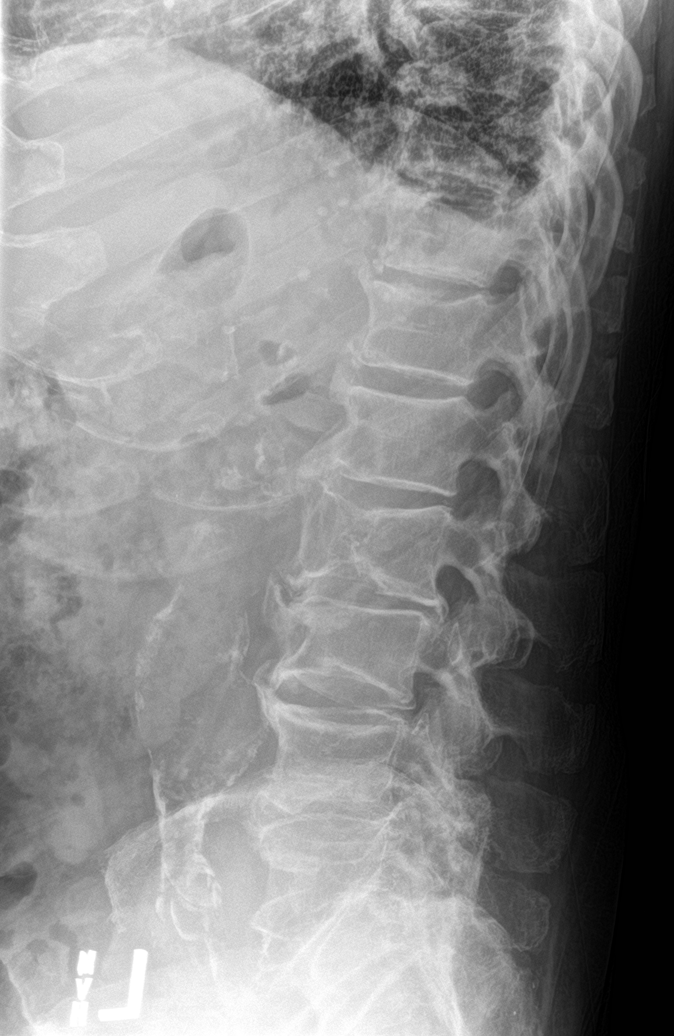

[l-spine spot (1 of 2)]
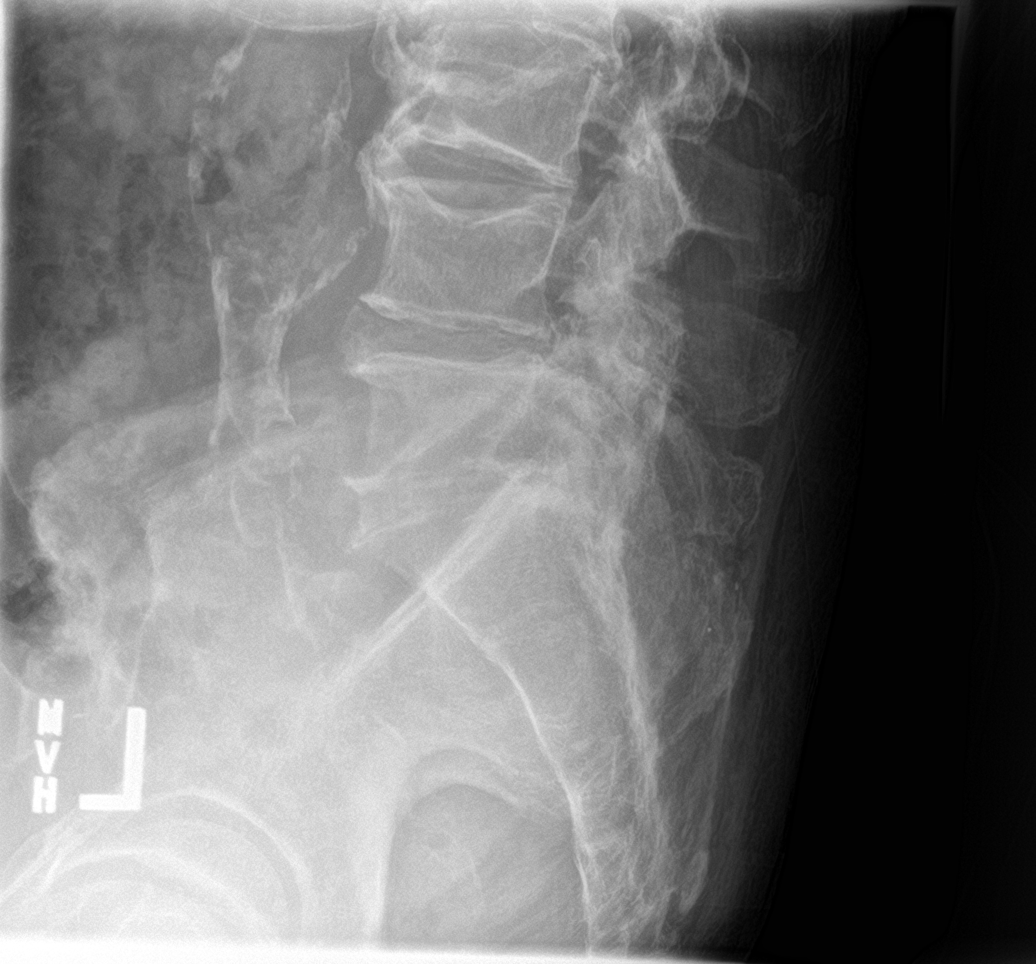

[l-spine spot (2 of 2)]
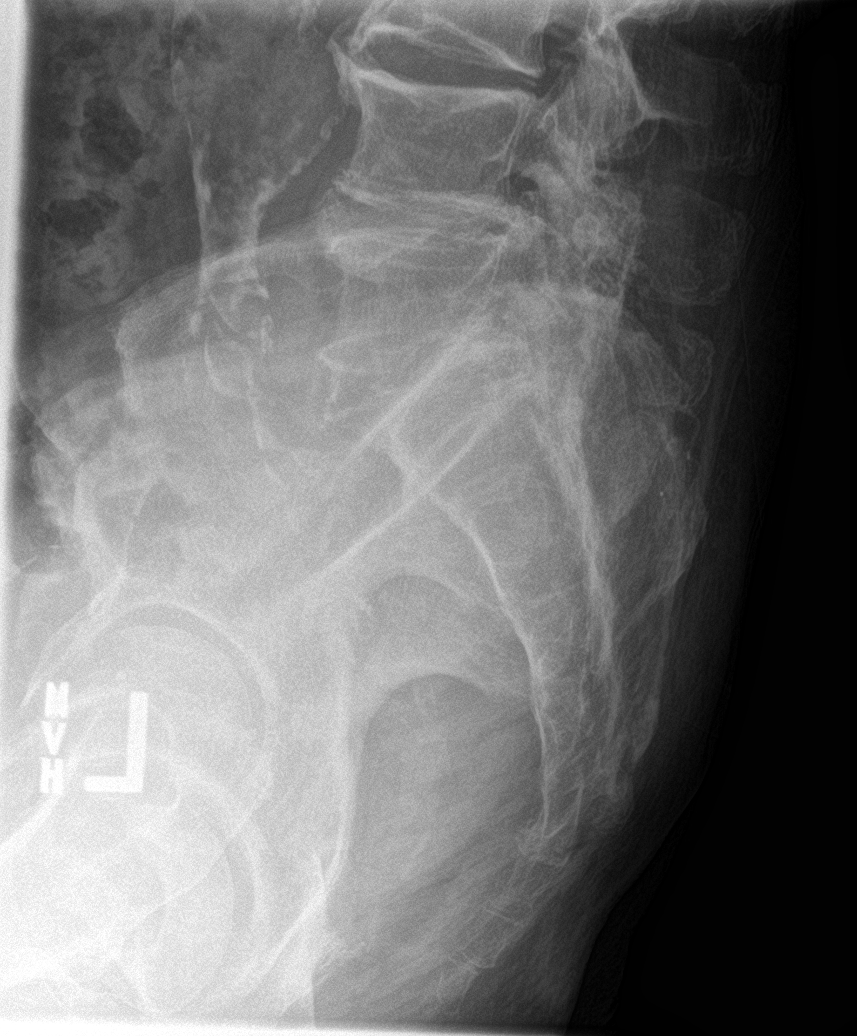

[6 of 6 positions shown; findings below may reference images not displayed]

FINDINGS: Straightening of normal lordosis, similar to prior exam. Stable
chronic compression fractures of L2 and inferior endplate of L3.
Prominent Schmorl's node superior endplate of L4 unchanged. There is
new superior endplate concavity of T12 that may represent interval
compression fracture. Diffuse endplate spurring and disc space
narrowing. Facet hypertrophy at L3-L4 through the lumbosacral
junction. Bones are diffusely under mineralized. No evidence of
focal bone lesion or bony destruction. Aortic atherosclerosis with
calcified aortic aneurysm measuring 4.8 cm, recently evaluated with
aortic ultrasound.
IMPRESSION: 1. Minor superior endplate concavity of T12 since 3571, suspicious
for age indeterminate compression fracture.
2. Chronic compression fractures of L2 and L3, unchanged from prior.
3. Multilevel degenerative disc disease and facet hypertrophy,
similar to prior exam.
